# Patient Record
Sex: Male | Born: 1955
Health system: Southern US, Community
[De-identification: ages and names within clinical notes are randomized; demographics above are authoritative.]

## PROBLEM LIST (undated history)

## (undated) ENCOUNTER — Emergency Department (HOSPITAL_COMMUNITY): Admission: EM | Payer: Self-pay | Source: Home / Self Care

## (undated) ENCOUNTER — Emergency Department (HOSPITAL_COMMUNITY): Payer: Self-pay | Source: Home / Self Care

## (undated) VITALS — BP 111/79 | HR 96 | Temp 97.2°F | Resp 16 | Ht 65.5 in | Wt 146.0 lb

## (undated) DIAGNOSIS — R569 Unspecified convulsions: Secondary | ICD-10-CM

## (undated) DIAGNOSIS — F17200 Nicotine dependence, unspecified, uncomplicated: Secondary | ICD-10-CM

## (undated) DIAGNOSIS — K219 Gastro-esophageal reflux disease without esophagitis: Secondary | ICD-10-CM

## (undated) DIAGNOSIS — K8020 Calculus of gallbladder without cholecystitis without obstruction: Secondary | ICD-10-CM

## (undated) DIAGNOSIS — F1010 Alcohol abuse, uncomplicated: Secondary | ICD-10-CM

## (undated) DIAGNOSIS — F319 Bipolar disorder, unspecified: Secondary | ICD-10-CM

## (undated) DIAGNOSIS — K279 Peptic ulcer, site unspecified, unspecified as acute or chronic, without hemorrhage or perforation: Secondary | ICD-10-CM

## (undated) DIAGNOSIS — F32A Depression, unspecified: Secondary | ICD-10-CM

## (undated) DIAGNOSIS — F209 Schizophrenia, unspecified: Secondary | ICD-10-CM

## (undated) DIAGNOSIS — F99 Mental disorder, not otherwise specified: Secondary | ICD-10-CM

## (undated) DIAGNOSIS — F329 Major depressive disorder, single episode, unspecified: Secondary | ICD-10-CM

---
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
## 2005-02-05 ENCOUNTER — Inpatient Hospital Stay (HOSPITAL_COMMUNITY): Admission: AD | Admit: 2005-02-05 | Discharge: 2005-02-08 | Payer: Self-pay | Admitting: Psychiatry

## 2005-02-06 ENCOUNTER — Ambulatory Visit: Payer: Self-pay | Admitting: Psychiatry

## 2010-06-26 ENCOUNTER — Emergency Department (HOSPITAL_COMMUNITY)
Admission: EM | Admit: 2010-06-26 | Discharge: 2010-06-27 | Disposition: A | Discharge status: Home / Self Care | Payer: Self-pay | Attending: Emergency Medicine | Admitting: Emergency Medicine

## 2010-06-26 DIAGNOSIS — R110 Nausea: Secondary | ICD-10-CM | POA: Insufficient documentation

## 2010-06-26 DIAGNOSIS — Z5900 Homelessness unspecified: Secondary | ICD-10-CM | POA: Insufficient documentation

## 2010-06-26 DIAGNOSIS — R069 Unspecified abnormalities of breathing: Secondary | ICD-10-CM | POA: Insufficient documentation

## 2010-06-26 DIAGNOSIS — K219 Gastro-esophageal reflux disease without esophagitis: Secondary | ICD-10-CM | POA: Insufficient documentation

## 2010-06-26 DIAGNOSIS — F17200 Nicotine dependence, unspecified, uncomplicated: Secondary | ICD-10-CM | POA: Insufficient documentation

## 2010-06-26 DIAGNOSIS — R109 Unspecified abdominal pain: Secondary | ICD-10-CM | POA: Insufficient documentation

## 2010-06-26 DIAGNOSIS — F1010 Alcohol abuse, uncomplicated: Secondary | ICD-10-CM | POA: Insufficient documentation

## 2010-06-26 LAB — LIPASE, BLOOD: Lipase: 40 U/L (ref 11–59)

## 2010-06-26 LAB — DIFFERENTIAL
Basophils Absolute: 0.1 10*3/uL (ref 0.0–0.1)
Basophils Relative: 1 % (ref 0–1)
Eosinophils Absolute: 0.6 10*3/uL (ref 0.0–0.7)
Monocytes Relative: 7 % (ref 3–12)
Neutro Abs: 4.4 10*3/uL (ref 1.7–7.7)
Neutrophils Relative %: 48 % (ref 43–77)

## 2010-06-26 LAB — URINALYSIS, ROUTINE W REFLEX MICROSCOPIC
Hgb urine dipstick: NEGATIVE
Protein, ur: NEGATIVE mg/dL
Specific Gravity, Urine: 1.009 (ref 1.005–1.030)
Urobilinogen, UA: 0.2 mg/dL (ref 0.0–1.0)

## 2010-06-26 LAB — CBC
Hemoglobin: 14.4 g/dL (ref 13.0–17.0)
MCH: 32.2 pg (ref 26.0–34.0)
Platelets: 336 10*3/uL (ref 150–400)
RBC: 4.47 MIL/uL (ref 4.22–5.81)
WBC: 9.2 10*3/uL (ref 4.0–10.5)

## 2010-06-26 LAB — COMPREHENSIVE METABOLIC PANEL
ALT: 16 U/L (ref 0–53)
Albumin: 3.8 g/dL (ref 3.5–5.2)
Alkaline Phosphatase: 68 U/L (ref 39–117)
Calcium: 9.1 mg/dL (ref 8.4–10.5)
GFR calc Af Amer: >60 mL/min (ref >60)
Glucose, Bld: 92 mg/dL (ref 70–99)
Potassium: 3.7 mEq/L (ref 3.5–5.1)
Sodium: 133 mEq/L — ABNORMAL LOW (ref 135–145)
Total Protein: 7.5 g/dL (ref 6.0–8.3)

## 2010-06-26 LAB — RAPID URINE DRUG SCREEN, HOSP PERFORMED
Amphetamines: NOT DETECTED
Barbiturates: NOT DETECTED

## 2010-06-26 LAB — ETHANOL: Alcohol, Ethyl (B): 162 mg/dL — ABNORMAL HIGH (ref 0–10)

## 2010-06-26 LAB — BRAIN NATRIURETIC PEPTIDE: Pro B Natriuretic peptide (BNP): <30 pg/mL (ref 0.0–100.0)

## 2011-07-09 ENCOUNTER — Emergency Department (HOSPITAL_COMMUNITY)
Admission: EM | Admit: 2011-07-09 | Discharge: 2011-07-09 | Disposition: A | Discharge status: Home / Self Care | Payer: Self-pay | Attending: Emergency Medicine | Admitting: Emergency Medicine

## 2011-07-09 ENCOUNTER — Encounter (HOSPITAL_COMMUNITY): Payer: Self-pay | Admitting: *Deleted

## 2011-07-09 DIAGNOSIS — Z79899 Other long term (current) drug therapy: Secondary | ICD-10-CM | POA: Insufficient documentation

## 2011-07-09 DIAGNOSIS — R443 Hallucinations, unspecified: Secondary | ICD-10-CM | POA: Insufficient documentation

## 2011-07-09 DIAGNOSIS — Z8659 Personal history of other mental and behavioral disorders: Secondary | ICD-10-CM | POA: Insufficient documentation

## 2011-07-09 DIAGNOSIS — F319 Bipolar disorder, unspecified: Secondary | ICD-10-CM | POA: Insufficient documentation

## 2011-07-09 DIAGNOSIS — F10929 Alcohol use, unspecified with intoxication, unspecified: Secondary | ICD-10-CM

## 2011-07-09 DIAGNOSIS — F1010 Alcohol abuse, uncomplicated: Secondary | ICD-10-CM | POA: Insufficient documentation

## 2011-07-09 HISTORY — DX: Alcohol abuse, uncomplicated: F10.10

## 2011-07-09 HISTORY — DX: Schizophrenia, unspecified: F20.9

## 2011-07-09 HISTORY — DX: Bipolar disorder, unspecified: F31.9

## 2011-07-09 LAB — RAPID URINE DRUG SCREEN, HOSP PERFORMED
Amphetamines: NOT DETECTED
Barbiturates: NOT DETECTED
Benzodiazepines: NOT DETECTED
Cocaine: NOT DETECTED

## 2011-07-09 LAB — URINALYSIS, ROUTINE W REFLEX MICROSCOPIC
Bilirubin Urine: NEGATIVE
Glucose, UA: NEGATIVE mg/dL
Ketones, ur: NEGATIVE mg/dL
Leukocytes, UA: NEGATIVE
Nitrite: NEGATIVE
Specific Gravity, Urine: 1.004 — ABNORMAL LOW (ref 1.005–1.030)
pH: 7 (ref 5.0–8.0)

## 2011-07-09 LAB — CBC
HCT: 34.8 % — ABNORMAL LOW (ref 39.0–52.0)
MCHC: 34.8 g/dL (ref 30.0–36.0)
MCV: 93.3 fL (ref 78.0–100.0)
Platelets: 343 10*3/uL (ref 150–400)
RDW: 12.7 % (ref 11.5–15.5)

## 2011-07-09 LAB — BASIC METABOLIC PANEL
BUN: 4 mg/dL — ABNORMAL LOW (ref 6–23)
CO2: 27 mEq/L (ref 19–32)
Calcium: 9.1 mg/dL (ref 8.4–10.5)
Chloride: 94 mEq/L — ABNORMAL LOW (ref 96–112)
Creatinine, Ser: 0.7 mg/dL (ref 0.50–1.35)

## 2011-07-09 MED ORDER — POTASSIUM CHLORIDE CRYS ER 20 MEQ PO TBCR
40.0000 meq | EXTENDED_RELEASE_TABLET | Freq: Once | ORAL | Status: AC
  Administered 2011-07-09: 40 meq via ORAL
  Filled 2011-07-09: qty 2

## 2011-07-09 NOTE — ED Notes (Signed)
Pt sts he has been hearing voices and not telling him to hurt self or others.  No SI/ HI.  Pt is wanting detox from etoh and last drink was tonite at 0200.  Pt is homeless and has hx of having seizures before from withdrawal.  No shaking now.

## 2011-07-09 NOTE — ED Notes (Signed)
Patient is resting comfortably. 

## 2011-07-09 NOTE — Discharge Instructions (Signed)
 RESOURCE GUIDE  Dental Problems  Patients with Medicaid: [facility]                     [facility] [address]                                           [address] Phone:  [phone]                                                  Phone:  [phone]  If unable to pay or uninsured, contact:  [facility] or [facility] to become qualified for the adult dental clinic.  Chronic Pain Problems Contact [facility]  [phone] Patients need to be referred by their primary care doctor.  Insufficient Money for Medicine Contact [employer] Way:  call "211" or [facility] 271-5999.  No Primary Care Doctor Call [facility]  [phone] Other agencies that provide inexpensive medical care    [facility]  [phone]    [facility]  [phone]    [facility]  [phone]    [facility] Clinic  [phone]    [facility]  [phone]    [facility]  [phone]  Psychological Services [facility]  [phone] [facility]  [phone] [facility]   [phone] (emergency services 641-4993)  Substance Abuse Resources [facility]  336-882-2125 [facility] 336-784-9470 [facility] 336-285-9073 [facility] 336-845-3988 Residential & Outpatient Substance Abuse Program  800-659-3381  Abuse/Neglect [place] Child Abuse Hotline (336) 641-3795 [place] [facility] Abuse Hotline 800-378-5315 (After Hours)  Emergency Shelter [facility] (336) 271-5985  [facility] (336) 275-9566 [facility] (704) 372-4663  MRSA Hotline #:   832-7006    [place] [facility]  [facility] [place]     [employer]                          [facility] [address] [place]                       [address]      [address]  [place]                                                [place]                            [place] Phone:  [phone]                                   Phone:  [phone]                 Phone:  [phone]  [facility] Phone:    [phone]  [place] Child Abuse Hotline (336) 342-1394 (336) 342-3537 (After Hours)   

## 2011-07-09 NOTE — ED Provider Notes (Signed)
Pt awake/alert, no distress Does not appear psychotic, watching TV He is requesting his meds (risperdal) and reports he has f/u at [facility] previously ACT to see patient BP 125/81  Pulse 74  Temp(Src) 97.5 F (36.4 C) (Oral)  Resp 19  SpO2 100%   Joya Gaskins, MD 07/09/11 [phone]

## 2011-07-09 NOTE — ED Notes (Signed)
Unable to get patient to give urine sample

## 2011-07-09 NOTE — ED Notes (Signed)
Patient denies pain and is resting comfortably.  

## 2011-07-09 NOTE — ED Provider Notes (Signed)
History     CSN: [id number]  Arrival date & time 07/09/11  0347   First MD Initiated Contact with Patient 07/09/11 0500      Chief Complaint  Patient presents with  . Medical Clearance    (Consider location/radiation/quality/duration/timing/severity/associated sxs/prior treatment) HPI History provided by the patient. He is homeless and has a history of schizophrenia and bipolar. He has ran out of his Risperdal and is requesting a refill at this time. He has been hearing voices and is able to only does what happens when not taking his medications. He denies any suicidal or homicidal ideations. Voices tell him to hurt himself and hurt other people -  he doesn't want to do that, patient states that if he cannot get his medications filled here prefer to be discharged. No self injury. He admits to drinking alcohol. He denies any drug abuse. Past Medical History  Diagnosis Date  . Alcohol abuse   . Schizophrenia   . Bipolar 1 disorder     Past Surgical History  Procedure Date  . Skin graft     No family history on file.  History  Substance Use Topics  . Smoking status: Current Everyday Smoker  . Smokeless tobacco: Not on file  . Alcohol Use: Yes     drinks as much etoh as he can get per day      Review of Systems  Constitutional: Negative for fever and chills.  HENT: Negative for neck pain and neck stiffness.   Eyes: Negative for pain.  Respiratory: Negative for shortness of breath.   Cardiovascular: Negative for chest pain.  Gastrointestinal: Negative for abdominal pain.  Genitourinary: Negative for dysuria.  Musculoskeletal: Negative for back pain.  Skin: Negative for rash.  Neurological: Negative for headaches.  Psychiatric/Behavioral: Positive for hallucinations. Negative for suicidal ideas and self-injury.  All other systems reviewed and are negative.    Allergies  Review of patient's allergies indicates no known allergies.  Home Medications  No current  outpatient prescriptions on file.  BP 125/81  Pulse 74  Temp(Src) 97.5 F (36.4 C) (Oral)  Resp 19  SpO2 100%  Physical Exam  Constitutional: He is oriented to person, place, and time. He appears well-developed and well-nourished.  HENT:  Head: Normocephalic and atraumatic.  Eyes: Conjunctivae and EOM are normal. Pupils are equal, round, and reactive to light.  Neck: Trachea normal. Neck supple. No thyromegaly present.  Cardiovascular: Normal rate, regular rhythm, S1 normal, S2 normal and normal pulses.     No systolic murmur is present   No diastolic murmur is present  Pulses:      Radial pulses are 2+ on the right side, and 2+ on the left side.  Pulmonary/Chest: Effort normal and breath sounds normal. He has no wheezes. He has no rhonchi. He has no rales. He exhibits no tenderness.  Abdominal: Soft. Normal appearance and bowel sounds are normal. There is no tenderness. There is no CVA tenderness and negative Murphy's sign.  Musculoskeletal:       BLE:s Calves nontender, no cords or erythema, negative Homans sign  Neurological: He is alert and oriented to person, place, and time. He has normal strength. No cranial nerve deficit or sensory deficit. GCS eye subscore is 4. GCS verbal subscore is 5. GCS motor subscore is 6.  Skin: Skin is warm and dry. No rash noted. He is not diaphoretic.  Psychiatric: His speech is normal.       Cooperative and appropriate. Denies active hallucinations  ED Course  Procedures (including critical care time)  Labs Reviewed  URINALYSIS, ROUTINE W REFLEX MICROSCOPIC - Abnormal; Notable for the following:    Specific Gravity, Urine 1.004 (*)    All other components within normal limits  ETHANOL - Abnormal; Notable for the following:    Alcohol, Ethyl (B) 134 (*)    All other components within normal limits  BASIC METABOLIC PANEL - Abnormal; Notable for the following:    Sodium 134 (*)    Potassium 3.0 (*)    Chloride 94 (*)    BUN 4 (*)     All other components within normal limits  CBC - Abnormal; Notable for the following:    RBC 3.73 (*)    Hemoglobin 12.1 (*)    HCT 34.8 (*)    All other components within normal limits  URINE RAPID DRUG SCREEN (HOSP PERFORMED)     MDM   Mild hallucinations. Requesting medication refill for psych meds. ACT team consulted for evaluation and to attempt med refill.           Sunnie Nielsen, MD 07/09/11 [phone]

## 2011-07-25 ENCOUNTER — Encounter (HOSPITAL_COMMUNITY): Payer: Self-pay | Admitting: Emergency Medicine

## 2011-07-25 ENCOUNTER — Emergency Department (HOSPITAL_COMMUNITY)
Admission: EM | Admit: 2011-07-25 | Discharge: 2011-07-26 | Disposition: A | Discharge status: Home / Self Care | Payer: Self-pay | Attending: Emergency Medicine | Admitting: Emergency Medicine

## 2011-07-25 DIAGNOSIS — F319 Bipolar disorder, unspecified: Secondary | ICD-10-CM | POA: Insufficient documentation

## 2011-07-25 DIAGNOSIS — R45851 Suicidal ideations: Secondary | ICD-10-CM | POA: Insufficient documentation

## 2011-07-25 LAB — DIFFERENTIAL
Basophils Relative: 1 % (ref 0–1)
Lymphocytes Relative: 40 % (ref 12–46)
Lymphs Abs: 3.2 10*3/uL (ref 0.7–4.0)
Monocytes Relative: 6 % (ref 3–12)
Neutro Abs: 3.1 10*3/uL (ref 1.7–7.7)
Neutrophils Relative %: 39 % — ABNORMAL LOW (ref 43–77)

## 2011-07-25 LAB — BASIC METABOLIC PANEL
BUN: 9 mg/dL (ref 6–23)
CO2: 23 mEq/L (ref 19–32)
Calcium: 9.2 mg/dL (ref 8.4–10.5)
Chloride: 100 mEq/L (ref 96–112)
Creatinine, Ser: 0.69 mg/dL (ref 0.50–1.35)
GFR calc Af Amer: >90 mL/min (ref >90)
GFR calc non Af Amer: >90 mL/min (ref >90)
Glucose, Bld: 91 mg/dL (ref 70–99)
Potassium: 3.6 mEq/L (ref 3.5–5.1)
Sodium: 136 mEq/L (ref 135–145)

## 2011-07-25 LAB — RAPID URINE DRUG SCREEN, HOSP PERFORMED
Amphetamines: NOT DETECTED
Barbiturates: NOT DETECTED
Benzodiazepines: NOT DETECTED
Cocaine: NOT DETECTED
Opiates: NOT DETECTED
Tetrahydrocannabinol: NOT DETECTED

## 2011-07-25 LAB — ETHANOL: Alcohol, Ethyl (B): 205 mg/dL — ABNORMAL HIGH (ref 0–11)

## 2011-07-25 LAB — CBC
Hemoglobin: 12.4 g/dL — ABNORMAL LOW (ref 13.0–17.0)
RBC: 3.82 MIL/uL — ABNORMAL LOW (ref 4.22–5.81)
WBC: 8 10*3/uL (ref 4.0–10.5)

## 2011-07-25 MED ORDER — ACETAMINOPHEN 325 MG PO TABS
650.0000 mg | ORAL_TABLET | ORAL | Status: DC | PRN
  Administered 2011-07-25: 650 mg via ORAL
  Filled 2011-07-25: qty 1

## 2011-07-25 MED ORDER — NICOTINE 21 MG/24HR TD PT24
21.0000 mg | MEDICATED_PATCH | Freq: Every day | TRANSDERMAL | Status: DC
  Administered 2011-07-26: 21 mg via TRANSDERMAL
  Filled 2011-07-25 (×2): qty 1

## 2011-07-25 NOTE — ED Notes (Signed)
"  I need respirdol 2 mg".    I am bipolar and my voices are telling me to do everything. Denies suicidal and homicidal ideations. Uncooperative with assessment.  Denies use os street drugs.  Admits to 2-40 oz beers a day and smokes 2 ppd.  Denies pain at this time.
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
## 2011-07-25 NOTE — ED Notes (Signed)
Consulting civil engineer working at Personal assistant for pt.  Door open , observation by ED staff at this time.

## 2011-07-26 ENCOUNTER — Encounter (HOSPITAL_COMMUNITY): Payer: Self-pay | Admitting: Behavioral Health

## 2011-07-26 MED ORDER — DIPHENHYDRAMINE HCL 25 MG PO CAPS: 50.0000 mg | ORAL_CAPSULE | Freq: Every day | ORAL | Status: DC

## 2011-07-26 MED ORDER — RISPERIDONE 2 MG PO TABS: 2.0000 mg | ORAL_TABLET | Freq: Every day | ORAL | Status: DC

## 2011-07-26 NOTE — ED Provider Notes (Signed)
Shift note  Informed by Lequita Halt, patient's nurse this patient had not been added to the ACT team list and no note.  Holding orders placed by Elpidio Anis.  Case d/w  Irving Burton of ACT who has now added patient to the list to be seen.  Patient states he does not remember how he got here but he hears voices that tell him to do bad things like kill himself.  He states he drinks as much as he can afford, he is homeless.  States he's been admitted before.   Patient awake and alert this am NCAT PERRL EOMI RRR CTAB NABS, soft non tender  Added to the ACT team list to be seen.    Jasmine Awe, MD 07/26/11 [phone]

## 2011-07-26 NOTE — ED Provider Notes (Addendum)
Patient is currently being assessed by the behavioral health team.  Per RN report there were no acute events overnight.  The patient's vital signs are stable.  Dispo per behavioral health recommendations.  Gerhard Munch, MD 07/26/11 1610  1:13 PM Per Dr. Trisha Mangle, the patient is appropriate for discharge.  Gerhard Munch, MD 07/26/11 1314

## 2011-07-26 NOTE — BHH Counselor (Signed)
Dr. Jeraldine Loots has ordered a telepsych for pt.

## 2011-07-26 NOTE — Discharge Instructions (Signed)
Manic Depression (Bipolar Disorder)  Bipolar disorder is also known as manic depressive illness. It is when the brain does not function properly and causes shifts in a person's moods, energy and ability to function in everyday life. These shifts are different from the normal ups and downs that everyone experiences. Instead the shifts are severe. If this goes untreated, the person's life becomes more and more disorderly. People with this disorder can be treated can lead full and productive lives. This disorder must be managed throughout life.   SYMPTOMS    Bipolar disorder causes dramatic mood swings. These mood swings go in cycles. They cycle from extreme "highs" and irritable to deep "lows" of sadness and hopelessness.   Between the extreme moods, there are usually periods of normal mood.   Along with the mood shifts, the person will have severe changes in energy and behavior. The periods of "highs" and "lows" are called episodes of mania and depression.  Signs of mania:   Lots of energy, activity and restlessness.   Extreme "high" or good mood.   Extreme irritability.   Racing thoughts and talking very fast.   Jumping from one idea to another.   Not able to focus, easily distracted.   Little need to sleep.   Grand beliefs in one's abilities and powers.   Spending sprees.   Increased sexual drive. This can result in many sexual partners.   Poor judgment.   Abuse of drugs, particularly cocaine, alcohol, and sleeping medication.   Aggressive or provocative behavior.   A lasting period of behavior that is different from usual.   Denial that anything is wrong.  *A manic episode is identified if a "high" mood happens with three or more of the other symptoms lasting most of the day, nearly everyday for a week or longer. If the mood is more irritable in nature, four additional symptoms must be present.  Signs of depression:   Lasting feelings of sadness, anxiety, or empty mood.   Feelings of  hopelessness with negative thoughts.   Feelings of guilt, worthlessness, or helplessness.   Loss of interest or pleasure in activities once enjoyed, including sex.   Feelings of fatigue or having less energy.   Trouble focusing, making decisions, remembering.   Feeling restless or irritable.   Sleeping too little or too much.   Change in eating with possible weight gain or loss.   Feeling ongoing pain that is not caused by physical illness or injury.   Thoughts of death or suicide or suicide attempts.  *A depressive episode is identified as having five or more of the above symptoms that last most of the day, nearly everyday for two weeks or longer.  CAUSES    Research shows that there is no single cause for the disorder. Many factors act together to produce the illness.   This can be passed down from family (hereditary).   Environment may play a part.  TREATMENT    Long-term treatment is strongly recommended because bipolar disorder is a repeated illness. This disorder is better controlled if treatment is ongoing than if it is off and on.   A combination of medication and talk therapy is best for managing the disorder over time.   Medication.   Medication can be prescribed by a doctor that is an expert in treating mental disorders (psychiatrists). Medications known as "mood stabilizers" are usually prescribed to help control the illness. Other medications can be added when needed. These medicines usually treat episodes   of mania or depression that break through despite the mood stabilizer.   Talk Therapy.   Along with medication, some forms of talk therapy are helpful in providing support, education and guidance to people with the illness and their families. Studies show that this type of treatment increases mood stability, decreases need for hospitalization and improves how they function society.   Electroconvulsive Therapy (ECT).   In extreme situations where the above treatments do not work or  work too slowly to relieve severe symptoms, ECT may be considered.  Document Released: 06/16/2000 Document Revised: 02/27/2011 Document Reviewed: 02/05/2007  ExitCare Patient Information 2012 [employer].

## 2011-07-26 NOTE — ED Notes (Signed)
Dr. Palumbo at bedside. 
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
## 2011-07-26 NOTE — BHH Counselor (Signed)
Dr. Jeraldine Loots is releasing clt due to telepsych recommendations. Counselor gave clt the following informational referrals: Homeless Shelters, Mobile Crisis contacts, [facility], IOP and residential tx facilities and [facility] contact information.

## 2011-08-13 ENCOUNTER — Emergency Department (HOSPITAL_COMMUNITY): Payer: Self-pay

## 2011-08-13 ENCOUNTER — Emergency Department (HOSPITAL_COMMUNITY)
Admission: EM | Admit: 2011-08-13 | Discharge: 2011-08-13 | Disposition: A | Discharge status: Home / Self Care | Payer: Self-pay | Attending: Emergency Medicine | Admitting: Emergency Medicine

## 2011-08-13 ENCOUNTER — Encounter (HOSPITAL_COMMUNITY): Payer: Self-pay

## 2011-08-13 DIAGNOSIS — R05 Cough: Secondary | ICD-10-CM | POA: Insufficient documentation

## 2011-08-13 DIAGNOSIS — F17200 Nicotine dependence, unspecified, uncomplicated: Secondary | ICD-10-CM | POA: Insufficient documentation

## 2011-08-13 DIAGNOSIS — R0989 Other specified symptoms and signs involving the circulatory and respiratory systems: Secondary | ICD-10-CM | POA: Insufficient documentation

## 2011-08-13 DIAGNOSIS — F319 Bipolar disorder, unspecified: Secondary | ICD-10-CM | POA: Insufficient documentation

## 2011-08-13 DIAGNOSIS — R0602 Shortness of breath: Secondary | ICD-10-CM | POA: Insufficient documentation

## 2011-08-13 DIAGNOSIS — R059 Cough, unspecified: Secondary | ICD-10-CM | POA: Insufficient documentation

## 2011-08-13 DIAGNOSIS — R4789 Other speech disturbances: Secondary | ICD-10-CM | POA: Insufficient documentation

## 2011-08-13 DIAGNOSIS — R404 Transient alteration of awareness: Secondary | ICD-10-CM | POA: Insufficient documentation

## 2011-08-13 DIAGNOSIS — Z8659 Personal history of other mental and behavioral disorders: Secondary | ICD-10-CM | POA: Insufficient documentation

## 2011-08-13 LAB — DIFFERENTIAL
Basophils Absolute: 0 10*3/uL (ref 0.0–0.1)
Basophils Relative: 0 % (ref 0–1)
Eosinophils Relative: 2 % (ref 0–5)
Monocytes Absolute: 1 10*3/uL (ref 0.1–1.0)
Neutro Abs: 7.1 10*3/uL (ref 1.7–7.7)

## 2011-08-13 LAB — COMPREHENSIVE METABOLIC PANEL
Albumin: 3.1 g/dL — ABNORMAL LOW (ref 3.5–5.2)
Alkaline Phosphatase: 57 U/L (ref 39–117)
BUN: 5 mg/dL — ABNORMAL LOW (ref 6–23)
CO2: 21 mEq/L (ref 19–32)
Chloride: 100 mEq/L (ref 96–112)
Creatinine, Ser: 0.81 mg/dL (ref 0.50–1.35)
GFR calc non Af Amer: >90 mL/min (ref >90)
Potassium: 3.5 mEq/L (ref 3.5–5.1)
Total Bilirubin: 0.1 mg/dL — ABNORMAL LOW (ref 0.3–1.2)

## 2011-08-13 LAB — CBC
HCT: 33.7 % — ABNORMAL LOW (ref 39.0–52.0)
MCHC: 34.4 g/dL (ref 30.0–36.0)
MCV: 93.1 fL (ref 78.0–100.0)
Platelets: 408 10*3/uL — ABNORMAL HIGH (ref 150–400)
RDW: 12.5 % (ref 11.5–15.5)
WBC: 10.2 10*3/uL (ref 4.0–10.5)

## 2011-08-13 LAB — SALICYLATE LEVEL: Salicylate Lvl: <2 mg/dL — ABNORMAL LOW (ref 2.8–20.0)

## 2011-08-13 LAB — ETHANOL: Alcohol, Ethyl (B): 150 mg/dL — ABNORMAL HIGH (ref 0–11)

## 2011-08-13 MED ORDER — ALBUTEROL SULFATE HFA 108 (90 BASE) MCG/ACT IN AERS
2.0000 | INHALATION_SPRAY | RESPIRATORY_TRACT | Status: DC
  Administered 2011-08-13: 2 via RESPIRATORY_TRACT
  Filled 2011-08-13: qty 6.7

## 2011-08-13 NOTE — ED Notes (Signed)
Pt given urinal and made aware of need for urine 

## 2011-08-13 NOTE — ED Notes (Signed)
Airborne precautions D/C'd per Trixie Dredge, PA

## 2011-08-13 NOTE — Discharge Instructions (Signed)
Use the resources below to find a primary care provider for follow up.  If you develop worsening shortness of breath, cough, fevers, or any chest pain, return to the ER for a recheck. You may return to the ER at any time for worsening condition or any new symptoms that concern you.   If you have no primary doctor, here are some resources that may be helpful:  Medicaid-accepting [facility] Providers:   - [facility]- [address]      [phone]      Mon-Fri 9am-7pm, Sat 9am-1pm   - [facility]- [address] [place]      [phone]   - [facility]- [address] [place]      [phone]   [facility]- [address]      [phone]   - Renaye Rakers- [address] [place], [address]      [phone]      Only accepts [place] patients       after they have her name applied to their card   Self Pay (no insurance) in [place]:   - Sickle Cell Patients: Dr Willey Blade, [facility]      [address] [place]      [phone]   - [facility] [phone]   - Physician Referral Service- [phone]   - [facility]- [address] [place]      [phone]   [facility]- [address]   - [facility]- see information above      (Speak to [employer] if you do not have insurance)   - [facility]- [address] [place]      [phone]   - [facility] [place]- [address]      [phone]   - [facility]- [address]      [phone]   - Dr Julio Sicks-  [address], [place]      [phone]   - [facility]- [address]      [phone]   - [facility]- [address]      [phone]      Also [address]      [phone]   - [facility]- [address]      [phone]      1st and 3rd Saturday every month,  10am-1pm Other agencies that provide inexpensive medical care:    [facility]  [phone]    [facility]  [phone]    [facility]  [phone]    [facility]  [phone]    [facility]  [phone]  General Information: Finding a doctor when you do not have health insurance can be tricky. Although you are not limited by an insurance plan, you are of course limited by her finances and how much but he can pay out of pocket.  What are your options if you don't have health insurance?   1) Find a Librarian, academic and Pay Out of Pocket Although you won't have to find out who is covered by your insurance plan, it is a good idea to ask around and get recommendations. You will then need to call the office and see if the doctor you  have chosen will accept you as a new patient and what types of options they offer for patients who are self-pay. Some doctors offer discounts or will set up payment plans for their patients who do not have insurance, but you will need to ask so you aren't surprised when you get to your appointment.  2) Contact Your Local Health Department Not all health departments have doctors that can see patients for sick visits, but many do, so it is worth a call to see if yours does. If you don't know where your local health department is, you can check in your phone book. The CDC also has a tool to help you locate your state's health department, and many state websites also have listings of all of their local health departments.  3) Find a Walk-in Clinic If your illness is not likely to be very severe or complicated, you may want to try a walk in clinic. These are popping up all over the country in pharmacies, drugstores, and shopping centers. They're usually staffed by nurse practitioners or physician assistants that have been trained to treat common illnesses and complaints. They're usually fairly quick and inexpensive. However, if you have serious medical  issues or chronic medical problems, these are probably not your best option  Cough, Adult  A cough is a reflex that helps clear your throat and airways. It can help heal the body or may be a reaction to an irritated airway. A cough may only last 2 or 3 weeks (acute) or may last more than 8 weeks (chronic).  CAUSES Acute cough:  Viral or bacterial infections.  Chronic cough:  Infections.   Allergies.   Asthma.   Post-nasal drip.   Smoking.   Heartburn or acid reflux.   Some medicines.   Chronic lung problems (COPD).   Cancer.  SYMPTOMS   Cough.   Fever.   Chest pain.   Increased breathing rate.   High-pitched whistling sound when breathing (wheezing).   Colored mucus that you cough up (sputum).  TREATMENT   A bacterial cough may be treated with antibiotic medicine.   A viral cough must run its course and will not respond to antibiotics.   Your caregiver may recommend other treatments if you have a chronic cough.  HOME CARE INSTRUCTIONS   Only take over-the-counter or prescription medicines for pain, discomfort, or fever as directed by your caregiver. Use cough suppressants only as directed by your caregiver.   Use a cold steam vaporizer or humidifier in your bedroom or home to help loosen secretions.   Sleep in a semi-upright position if your cough is worse at night.   Rest as needed.   Stop smoking if you smoke.  SEEK IMMEDIATE MEDICAL CARE IF:   You have pus in your sputum.   Your cough starts to worsen.   You cannot control your cough with suppressants and are losing sleep.   You begin coughing up blood.   You have difficulty breathing.   You develop pain which is getting worse or is uncontrolled with medicine.   You have a fever.  MAKE SURE YOU:   Understand these instructions.   Will watch your condition.   Will get help right away if you are not doing well or get worse.  Document Released: 09/06/2010 Document Revised: 02/27/2011  Document Reviewed: 09/06/2010 [facility] Patient Information 2012 [place], [place].  Nicotine Addiction Nicotine can act as both a stimulant (excites/activates) and a sedative (calms/quiets). Immediately after exposure to nicotine,  there is a "kick" caused in part by the drug's stimulation of the adrenal glands and resulting discharge of adrenaline (epinephrine). The rush of adrenaline stimulates the body and causes a sudden release of sugar. This means that smokers are always slightly hyperglycemic. Hyperglycemic means that the blood sugar is high, just like in diabetics. Nicotine also decreases the amount of insulin which helps control sugar levels in the body. There is an increase in blood pressure, breathing, and the rate of heart beats.  In addition, nicotine indirectly causes a release of dopamine in the brain that controls pleasure and motivation. A similar reaction is seen with other drugs of abuse, such as cocaine and heroin. This dopamine release is thought to cause the pleasurable sensations when smoking. In some different cases, nicotine can also create a calming effect, depending on sensitivity of the smoker's nervous system and the dose of nicotine taken. WHAT HAPPENS WHEN NICOTINE IS TAKEN FOR LONG PERIODS OF TIME?  Long-term use of nicotine results in addiction. It is difficult to stop.   Repeated use of nicotine creates tolerance. Higher doses of nicotine are needed to get the "kick."  When nicotine use is stopped, withdrawal may last a month or more. Withdrawal may begin within a few hours after the last cigarette. Symptoms peak within the first few days and may lessen within a few weeks. For some people, however, symptoms may last for months or longer. Withdrawal symptoms include:   Irritability.   Craving.   Learning and attention deficits.   Sleep disturbances.   Increased appetite.  Craving for tobacco may last for 6 months or longer. Many behaviors done while using nicotine  can also play a part in the severity of withdrawal symptoms. For some people, the feel, smell, and sight of a cigarette and the ritual of obtaining, handling, lighting, and smoking the cigarette are closely linked with the pleasure of smoking. When stopped, they also miss the related behaviors which make the withdrawal or craving worse. While nicotine gum and patches may lessen the drug aspects of withdrawal, cravings often persist. WHAT ARE THE MEDICAL CONSEQUENCES OF NICOTINE USE?  Nicotine addiction accounts for one-third of all cancers. The top cancer caused by tobacco is lung cancer. Lung cancer is the number one cancer killer of both men and women.   Smoking is also associated with cancers of the:   Mouth.   Pharynx.   Larynx.   Esophagus.   Stomach.   Pancreas.   Cervix.   Kidney.   Ureter.   Bladder.   Smoking also causes lung diseases such as lasting (chronic) bronchitis and emphysema.   It worsens asthma in adults and children.   Smoking increases the risk of heart disease, including:   Stroke.   Heart attack.   Vascular disease.   Aneurysm.   Passive or secondary smoke can also increase medical risks including:   Asthma in children.   Sudden Infant Death Syndrome (SIDS).   Additionally, dropped cigarettes are the leading cause of residential fire fatalities.   Nicotine poisoning has been reported from accidental ingestion of tobacco products by children and pets. Death usually results in a few minutes from respiratory failure (when a person stops breathing) caused by paralysis.  TREATMENT   Medication. Nicotine replacement medicines such as nicotine gum and the patch are used to stop smoking. These medicines gradually lower the dosage of nicotine in the body. These medicines do not contain the carbon monoxide and other toxins found in tobacco  smoke.   Hypnotherapy.   Relaxation therapy.   Nicotine Anonymous (a 12-step support program). Find times  and locations in your local yellow pages.  Document Released: 11/14/2003 Document Revised: 02/27/2011 Document Reviewed: 04/07/2007 [facility] Patient Information 2012 [place], [place].  Smoking Cessation This document explains the best ways for you to quit smoking and new treatments to help. It lists new medicines that can double or triple your chances of quitting and quitting for good. It also considers ways to avoid relapses and concerns you may have about quitting, including weight gain. NICOTINE: A POWERFUL ADDICTION If you have tried to quit smoking, you know how hard it can be. It is hard because nicotine is a very addictive drug. For some people, it can be as addictive as heroin or cocaine. Usually, people make 2 or 3 tries, or more, before finally being able to quit. Each time you try to quit, you can learn about what helps and what hurts. Quitting takes hard work and a lot of effort, but you can quit smoking. QUITTING SMOKING IS ONE OF THE MOST IMPORTANT THINGS YOU WILL EVER DO.  You will live longer, feel better, and live better.   The impact on your body of quitting smoking is felt almost immediately:   Within 20 minutes, blood pressure decreases. Pulse returns to its normal level.   After 8 hours, carbon monoxide levels in the blood return to normal. Oxygen level increases.   After 24 hours, chance of heart attack starts to decrease. Breath, hair, and body stop smelling like smoke.   After 48 hours, damaged nerve endings begin to recover. Sense of taste and smell improve.   After 72 hours, the body is virtually free of nicotine. Bronchial tubes relax and breathing becomes easier.   After 2 to 12 weeks, lungs can hold more air. Exercise becomes easier and circulation improves.   Quitting will reduce your risk of having a heart attack, stroke, cancer, or lung disease:   After 1 year, the risk of coronary heart disease is cut in half.   After 5 years, the risk of stroke falls to  the same as a nonsmoker.   After 10 years, the risk of lung cancer is cut in half and the risk of other cancers decreases significantly.   After 15 years, the risk of coronary heart disease drops, usually to the level of a nonsmoker.   If you are pregnant, quitting smoking will improve your chances of having a healthy baby.   The people you live with, especially your children, will be healthier.   You will have extra money to spend on things other than cigarettes.  FIVE KEYS TO QUITTING Studies have shown that these 5 steps will help you quit smoking and quit for good. You have the best chances of quitting if you use them together: 1. Get ready.  2. Get support and encouragement.  3. Learn new skills and behaviors.  4. Get medicine to reduce your nicotine addiction and use it correctly.  5. Be prepared for relapse or difficult situations. Be determined to continue trying to quit, even if you do not succeed at first.  1. GET READY  Set a quit date.   Change your environment.   Get rid of ALL cigarettes, ashtrays, matches, and lighters in your home, car, and place of work.   Do not let people smoke in your home.   Review your past attempts to quit. Think about what worked and what did not.  Once you quit, do not smoke. NOT EVEN A PUFF!  2. GET SUPPORT AND ENCOURAGEMENT Studies have shown that you have a better chance of being successful if you have help. You can get support in many ways.  Tell your family, friends, and coworkers that you are going to quit and need their support. Ask them not to smoke around you.   Talk to your caregivers (doctor, dentist, nurse, pharmacist, psychologist, and/or smoking counselor).   Get individual, group, or telephone counseling and support. The more counseling you have, the better your chances are of quitting. Programs are available at [employer] and health centers. Call your local health department for information about programs in your  area.   Spiritual beliefs and practices may help some smokers quit.   Quit meters are Photographer that keep track of quit statistics, such as amount of "quit-time," cigarettes not smoked, and money saved.   Many smokers find one or more of the many self-help books available useful in helping them quit and stay off tobacco.  3. LEARN NEW SKILLS AND BEHAVIORS  Try to distract yourself from urges to smoke. Talk to someone, go for a walk, or occupy your time with a task.   When you first try to quit, change your routine. Take a different route to work. Drink tea instead of coffee. Eat breakfast in a different place.   Do something to reduce your stress. Take a hot bath, exercise, or read a book.   Plan something enjoyable to do every day. Reward yourself for not smoking.   Explore interactive web-based programs that specialize in helping you quit.  4. GET MEDICINE AND USE IT CORRECTLY Medicines can help you stop smoking and decrease the urge to smoke. Combining medicine with the above behavioral methods and support can quadruple your chances of successfully quitting smoking. The U.S. Food and Drug Administration (FDA) has approved 7 medicines to help you quit smoking. These medicines fall into 3 categories.  Nicotine replacement therapy (delivers nicotine to your body without the negative effects and risks of smoking):   Nicotine gum: Available over-the-counter.   Nicotine lozenges: Available over-the-counter.   Nicotine inhaler: Available by prescription.   Nicotine nasal spray: Available by prescription.   Nicotine skin patches (transdermal): Available by prescription and over-the-counter.   Antidepressant medicine (helps people abstain from smoking, but how this works is unknown):   Bupropion sustained-release (SR) tablets: Available by prescription.   Nicotinic receptor partial agonist (simulates the effect of nicotine in your brain):    Varenicline tartrate tablets: Available by prescription.   Ask your caregiver for advice about which medicines to use and how to use them. Carefully read the information on the package.   Everyone who is trying to quit may benefit from using a medicine. If you are pregnant or trying to become pregnant, nursing an infant, you are under age 52, or you smoke fewer than 10 cigarettes per day, talk to your caregiver before taking any nicotine replacement medicines.   You should stop using a nicotine replacement product and call your caregiver if you experience nausea, dizziness, weakness, vomiting, fast or irregular heartbeat, mouth problems with the lozenge or gum, or redness or swelling of the skin around the patch that does not go away.   Do not use any other product containing nicotine while using a nicotine replacement product.   Talk to your caregiver before using these products if you have diabetes, heart disease, asthma, stomach  ulcers, you had a recent heart attack, you have high blood pressure that is not controlled with medicine, a history of irregular heartbeat, or you have been prescribed medicine to help you quit smoking.  5. BE PREPARED FOR RELAPSE OR DIFFICULT SITUATIONS  Most relapses occur within the first 3 months after quitting. Do not be discouraged if you start smoking again. Remember, most people try several times before they finally quit.   You may have symptoms of withdrawal because your body is used to nicotine. You may crave cigarettes, be irritable, feel very hungry, cough often, get headaches, or have difficulty concentrating.   The withdrawal symptoms are only temporary. They are strongest when you first quit, but they will go away within 10 to 14 days.  Here are some difficult situations to watch for:  Alcohol. Avoid drinking alcohol. Drinking lowers your chances of successfully quitting.   Caffeine. Try to reduce the amount of caffeine you consume. It also lowers  your chances of successfully quitting.   Other smokers. Being around smoking can make you want to smoke. Avoid smokers.   Weight gain. Many smokers will gain weight when they quit, usually less than 10 pounds. Eat a healthy diet and stay active. Do not let weight gain distract you from your main goal, quitting smoking. Some medicines that help you quit smoking may also help delay weight gain. You can always lose the weight gained after you quit.   Bad mood or depression. There are a lot of ways to improve your mood other than smoking.  If you are having problems with any of these situations, talk to your caregiver. SPECIAL SITUATIONS AND CONDITIONS Studies suggest that everyone can quit smoking. Your situation or condition can give you a special reason to quit.  Pregnant women/new mothers: By quitting, you protect your baby's health and your own.   Hospitalized patients: By quitting, you reduce health problems and help healing.   Heart attack patients: By quitting, you reduce your risk of a second heart attack.   Lung, head, and neck cancer patients: By quitting, you reduce your chance of a second cancer.   Parents of children and adolescents: By quitting, you protect your children from illnesses caused by secondhand smoke.  QUESTIONS TO THINK ABOUT Think about the following questions before you try to stop smoking. You may want to talk about your answers with your caregiver.  Why do you want to quit?   If you tried to quit in the past, what helped and what did not?   What will be the most difficult situations for you after you quit? How will you plan to handle them?   Who can help you through the tough times? Your family? Friends? Caregiver?   What pleasures do you get from smoking? What ways can you still get pleasure if you quit?  Here are some questions to ask your caregiver:  How can you help me to be successful at quitting?   What medicine do you think would be best for me  and how should I take it?   What should I do if I need more help?   What is smoking withdrawal like? How can I get information on withdrawal?  Quitting takes hard work and a lot of effort, but you can quit smoking. FOR MORE INFORMATION  Smokefree.gov (http://www.davis-sullivan.com/) provides free, accurate, evidence-based information and professional assistance to help support the immediate and long-term needs of people trying to quit smoking. Document Released: 03/04/2001 Document Revised:  02/27/2011 Document Reviewed: 12/25/2008 [facility] Patient Information 2012 [place], [place].

## 2011-08-13 NOTE — ED Provider Notes (Signed)
Medical screening examination/treatment/procedure(s) were performed by non-physician practitioner and as supervising physician I was immediately available for consultation/collaboration.  Debbora Ang M Mykel Sponaugle, MD 08/13/11 1800 

## 2011-08-13 NOTE — ED Provider Notes (Signed)
History     CSN: [id number]  Arrival date & time 08/13/11  0500   First MD Initiated Contact with Patient 08/13/11 [phone]      Chief Complaint  Patient presents with  . Shortness of Breath    (Consider location/radiation/quality/duration/timing/severity/associated sxs/prior treatment) HPI Comments: Patient is somnolent - answers questions though speech is slurred and he goes to sleep soon after answering.   Pt reports he has been SOB for years, coughing for years, cough productive of white sputum.  States he thought he had a fever yesterday morning but doesn't think he has one any more.  Denies abdominal pain, N/V, chest pain, leg swelling.  Pt admits to drinking alcohol yesterday - 12 beers.    Patient is a 56 y.o. male presenting with shortness of breath. The history is provided by the patient. The history is limited by the condition of the patient.  Shortness of Breath  Associated symptoms include shortness of breath.    Past Medical History  Diagnosis Date  . Alcohol abuse   . Schizophrenia   . Bipolar 1 disorder     Past Surgical History  Procedure Date  . Skin graft     History reviewed. No pertinent family history.  History  Substance Use Topics  . Smoking status: Current Everyday Smoker  . Smokeless tobacco: Not on file  . Alcohol Use: 12.6 oz/week    21 Cans of beer per week     drinks as much etoh as he can get per day      Review of Systems  Unable to perform ROS: Other  Respiratory: Positive for shortness of breath.   Patient is intoxicated.   Allergies  Review of patient's allergies indicates no known allergies.  Home Medications  No current outpatient prescriptions on file.  BP 109/62  Pulse 82  Temp(Src) 98.2 F (36.8 C) (Oral)  Resp 25  SpO2 93%  Physical Exam  Nursing note and vitals reviewed. Constitutional: He is oriented to person, place, and time. He appears well-developed and well-nourished.  HENT:  Head: Normocephalic and  atraumatic.  Neck: Neck supple.  Cardiovascular: Normal rate and regular rhythm.   Pulmonary/Chest: Effort normal. No respiratory distress. He has no decreased breath sounds. He has no wheezes. He has no rhonchi. He has rales in the left lower field.  Neurological: He is alert and oriented to person, place, and time.    ED Course  Procedures (including critical care time)  Labs Reviewed  COMPREHENSIVE METABOLIC PANEL - Abnormal; Notable for the following:    Sodium 134 (*)    BUN 5 (*)    Albumin 3.1 (*)    Total Bilirubin 0.1 (*)    All other components within normal limits  ETHANOL - Abnormal; Notable for the following:    Alcohol, Ethyl (B) 150 (*)    All other components within normal limits  CBC - Abnormal; Notable for the following:    RBC 3.62 (*)    Hemoglobin 11.6 (*)    HCT 33.7 (*)    Platelets 408 (*)    All other components within normal limits  SALICYLATE LEVEL - Abnormal; Notable for the following:    Salicylate Lvl <2.0 (*)    All other components within normal limits  ACETAMINOPHEN LEVEL  DIFFERENTIAL  URINE RAPID DRUG SCREEN (HOSP PERFORMED)   Dg Chest 2 View  08/13/2011  *RADIOLOGY REPORT*  Clinical Data: Cough, shortness of breath and fever.  CHEST - 2 VIEW  Comparison:  None.  Findings: Trachea is midline.  Heart size normal.  Lungs are hyperinflated.  There may be mild interstitial prominence at the lung bases.  No pleural fluid.  IMPRESSION: Hyperinflation.  Bibasilar interstitial prominence may be due to emphysema.  A viral process cannot be excluded.  Original Report Authenticated By: Reyes Ivan, M.D.    Filed Vitals:   08/13/11 0722  BP: 110/93  Pulse:   Temp: 97.7 F (36.5 C)  Resp: 26    8:02 AM Patient sleeping soundly, on monitor, O2 sats normal.    9:18 AM Patient is alert and awake now.  States he feels much better than when he came in.  States he thought he was short of breath because he was "listening to people too much."  States  he does not feel sick and is not coughing more or more SOB than usual.  Discussed xray results and discussed the dangers of smoking.  Will d/c home with albuterol inhaler.    1. Smoker   2. Cough       MDM  Afebrile, intoxicated patient presenting with SOB, cough "for years."  CXR shows possible emphysema, labs remarkable for ETOH 150 and Hgb 11.6.  Discussed results with patient.  After patient slept and became more sober, he denied any of his presenting complaints.  D/C home with albuterol inhaler, return precautions.  Patient verbalizes understanding and agrees with plan.          Rise Patience, [place] 08/13/11 1534

## 2011-08-13 NOTE — ED Notes (Signed)
Pt also states that he wakes up sweating every night where his sleeping bag is soaking wet, hes not homeless, states he lives in a house, pt has a dry cough

## 2011-08-13 NOTE — ED Notes (Signed)
Pt complains of being short of breath, he states he knows its because he smokes, he called ems hisself for transport.

## 2011-08-13 NOTE — ED Notes (Signed)
Emily, PA at bedside.

## 2011-10-18 ENCOUNTER — Encounter (HOSPITAL_COMMUNITY): Payer: Self-pay | Admitting: *Deleted

## 2011-10-18 ENCOUNTER — Emergency Department (HOSPITAL_COMMUNITY)
Admission: EM | Admit: 2011-10-18 | Discharge: 2011-10-19 | Disposition: A | Discharge status: Home / Self Care | Payer: Self-pay | Attending: Emergency Medicine | Admitting: Emergency Medicine

## 2011-10-18 DIAGNOSIS — Z5900 Homelessness unspecified: Secondary | ICD-10-CM | POA: Insufficient documentation

## 2011-10-18 DIAGNOSIS — F17200 Nicotine dependence, unspecified, uncomplicated: Secondary | ICD-10-CM | POA: Insufficient documentation

## 2011-10-18 DIAGNOSIS — F209 Schizophrenia, unspecified: Secondary | ICD-10-CM | POA: Insufficient documentation

## 2011-10-18 DIAGNOSIS — F319 Bipolar disorder, unspecified: Secondary | ICD-10-CM | POA: Insufficient documentation

## 2011-10-18 DIAGNOSIS — F1020 Alcohol dependence, uncomplicated: Secondary | ICD-10-CM | POA: Insufficient documentation

## 2011-10-18 LAB — COMPREHENSIVE METABOLIC PANEL
ALT: 38 U/L (ref 0–53)
AST: 77 U/L — ABNORMAL HIGH (ref 0–37)
Albumin: 3.6 g/dL (ref 3.5–5.2)
Calcium: 9.2 mg/dL (ref 8.4–10.5)
GFR calc Af Amer: >90 mL/min (ref >90)
Potassium: 3.9 mEq/L (ref 3.5–5.1)
Sodium: 141 mEq/L (ref 135–145)
Total Protein: 8 g/dL (ref 6.0–8.3)

## 2011-10-18 LAB — RAPID URINE DRUG SCREEN, HOSP PERFORMED
Amphetamines: NOT DETECTED
Barbiturates: NOT DETECTED
Benzodiazepines: NOT DETECTED
Cocaine: NOT DETECTED
Tetrahydrocannabinol: NOT DETECTED

## 2011-10-18 LAB — CBC
MCH: 31.9 pg (ref 26.0–34.0)
MCHC: 34.6 g/dL (ref 30.0–36.0)
Platelets: 278 10*3/uL (ref 150–400)

## 2011-10-18 NOTE — ED Notes (Signed)
Pt reported to PTAR that he is hearing voices and has been out of Risperidal for unknown amount of time. Pt states voices are telling him to kill himself. CBG 90 for PTAR, VS: 130/78, 92, 16, 96% RA. Pt is homeless and cooperative at this time.

## 2011-10-18 NOTE — ED Notes (Signed)
Pt placed in blue scrubs.  All belongings moved to behind nurses station.

## 2011-10-18 NOTE — ED Notes (Signed)
Pt given meal and beverage

## 2011-10-19 MED ORDER — ALUM & MAG HYDROXIDE-SIMETH 200-200-20 MG/5ML PO SUSP: 30.0000 mL | ORAL | Status: DC | PRN

## 2011-10-19 MED ORDER — IBUPROFEN 600 MG PO TABS: 600.0000 mg | ORAL_TABLET | Freq: Three times a day (TID) | ORAL | Status: DC | PRN

## 2011-10-19 MED ORDER — ACETAMINOPHEN 325 MG PO TABS: 650.0000 mg | ORAL_TABLET | ORAL | Status: DC | PRN

## 2011-10-19 MED ORDER — ZOLPIDEM TARTRATE 5 MG PO TABS: 5.0000 mg | ORAL_TABLET | Freq: Every evening | ORAL | Status: DC | PRN

## 2011-10-19 MED ORDER — LORAZEPAM 1 MG PO TABS: 1.0000 mg | ORAL_TABLET | Freq: Four times a day (QID) | ORAL | Status: DC | PRN
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
## 2011-10-19 NOTE — BHH Counselor (Signed)
Discussed pt with EDP who recommends pt be discharged with outpatient referrals, homeless resources, and safety plan. Pt agrees with plan and accepted resources. Pt denies SI and HI. He endorses auditory hallucinations with no command. Pt states he has access to a phone and will call 911 or mobile crisis if needed. Pt plans to follow up with [facility].

## 2011-10-19 NOTE — ED Provider Notes (Signed)
History     CSN: [id number]  Arrival date & time 10/18/11  2112   First MD Initiated Contact with Patient 10/18/11 2227      Chief Complaint  Patient presents with  . Medical Clearance    (Consider location/radiation/quality/duration/timing/severity/associated sxs/prior treatment) HPI Comments: Patient has a history of schizophrenia, bipolar, also alcoholism, states she's been agreed +2 and half years.  Has not been on any meds and 2 and half years.  He is homeless.  Tonight.  He states his feet hurt, which have been hurting for years and he is hearing voices.  Is not quite sure what the voices are telling him.  The history is provided by the patient.    Past Medical History  Diagnosis Date  . Alcohol abuse   . Schizophrenia   . Bipolar 1 disorder     Past Surgical History  Procedure Date  . Skin graft     History reviewed. No pertinent family history.  History  Substance Use Topics  . Smoking status: Current Everyday Smoker  . Smokeless tobacco: Not on file  . Alcohol Use: 12.6 oz/week    21 Cans of beer per week     drinks as much etoh as he can get per day      Review of Systems  Constitutional: Negative for fever.  Musculoskeletal: Negative for joint swelling and gait problem.  Skin: Negative for wound.  Neurological: Negative for dizziness and headaches.  Psychiatric/Behavioral: Positive for hallucinations.    Allergies  Review of patient's allergies indicates no known allergies.  Home Medications  No current outpatient prescriptions on file.  BP 143/88  Pulse 88  Temp 98.3 F (36.8 C) (Oral)  Resp 16  SpO2 97%  Physical Exam  Constitutional: He appears well-developed and well-nourished.  HENT:  Head: Normocephalic.  Eyes: Pupils are equal, round, and reactive to light.  Neck: Normal range of motion.  Cardiovascular: Normal rate.   Pulmonary/Chest: Effort normal.  Abdominal: Soft.  Musculoskeletal: Normal range of motion.  Neurological:  He is alert.  Skin: Skin is warm. No rash noted. No erythema.  Psychiatric: His affect is not angry and not inappropriate. His speech is tangential. He is actively hallucinating. He expresses suicidal ideation. He expresses no suicidal plans.    ED Course  Procedures (including critical care time)  Labs Reviewed  CBC - Abnormal; Notable for the following:    RBC 4.20 (*)     HCT 38.7 (*)     All other components within normal limits  COMPREHENSIVE METABOLIC PANEL - Abnormal; Notable for the following:    AST 77 (*)     Total Bilirubin 0.2 (*)     All other components within normal limits  ETHANOL - Abnormal; Notable for the following:    Alcohol, Ethyl (B) 262 (*)     All other components within normal limits  URINE RAPID DRUG SCREEN (HOSP PERFORMED)   No results found.   1. Alcoholism       MDM  [facility] evaluation         [name], NP 10/19/11 2158  [name], NP 10/19/11 2158

## 2011-10-19 NOTE — ED Provider Notes (Signed)
Jimmy [name] is a 56 y.o. male being evaluated for alcohol dependence suicidal ideation. No hallucinations. At this time he is not suicidal. His only hallucinations, or chronic. They are not command hallucinations. He does not want further help now. He is homeless.  Case discussed with [facility]. They will provide resources for followup care.    Flint Melter, MD 10/19/11 [phone]

## 2011-10-20 NOTE — ED Provider Notes (Signed)
Medical screening examination/treatment/procedure(s) were performed by non-physician practitioner and as supervising physician I was immediately available for consultation/collaboration.   Celene Kras, MD 10/20/11 [phone]

## 2011-10-21 ENCOUNTER — Emergency Department (HOSPITAL_COMMUNITY)
Admission: EM | Admit: 2011-10-21 | Discharge: 2011-10-22 | Disposition: A | Discharge status: Other Acute Inpatient Hospital | Payer: Self-pay | Attending: Emergency Medicine | Admitting: Emergency Medicine

## 2011-10-21 DIAGNOSIS — Z5900 Homelessness unspecified: Secondary | ICD-10-CM | POA: Insufficient documentation

## 2011-10-21 DIAGNOSIS — R443 Hallucinations, unspecified: Secondary | ICD-10-CM | POA: Insufficient documentation

## 2011-10-21 DIAGNOSIS — F17200 Nicotine dependence, unspecified, uncomplicated: Secondary | ICD-10-CM | POA: Insufficient documentation

## 2011-10-21 DIAGNOSIS — Z8659 Personal history of other mental and behavioral disorders: Secondary | ICD-10-CM | POA: Insufficient documentation

## 2011-10-21 DIAGNOSIS — F319 Bipolar disorder, unspecified: Secondary | ICD-10-CM | POA: Insufficient documentation

## 2011-10-21 DIAGNOSIS — F1010 Alcohol abuse, uncomplicated: Secondary | ICD-10-CM | POA: Insufficient documentation

## 2011-10-21 DIAGNOSIS — R45851 Suicidal ideations: Secondary | ICD-10-CM | POA: Insufficient documentation

## 2011-10-21 DIAGNOSIS — R440 Auditory hallucinations: Secondary | ICD-10-CM

## 2011-10-22 ENCOUNTER — Encounter (HOSPITAL_COMMUNITY): Payer: Self-pay

## 2011-10-22 ENCOUNTER — Inpatient Hospital Stay (HOSPITAL_COMMUNITY)
Admission: AD | Admit: 2011-10-22 | Discharge: 2011-10-27 | DRG: 897 | Disposition: A | Discharge status: Home / Self Care | Payer: Federal, State, Local not specified - Other | Source: Ambulatory Visit | Attending: Psychiatry | Admitting: Psychiatry

## 2011-10-22 ENCOUNTER — Encounter (HOSPITAL_COMMUNITY): Payer: Self-pay | Admitting: Emergency Medicine

## 2011-10-22 DIAGNOSIS — F1020 Alcohol dependence, uncomplicated: Principal | ICD-10-CM

## 2011-10-22 DIAGNOSIS — F1210 Cannabis abuse, uncomplicated: Secondary | ICD-10-CM

## 2011-10-22 LAB — ACETAMINOPHEN LEVEL: Acetaminophen (Tylenol), Serum: <15 ug/mL (ref 10–30)

## 2011-10-22 LAB — CBC
HCT: 36.4 % — ABNORMAL LOW (ref 39.0–52.0)
Hemoglobin: 12.6 g/dL — ABNORMAL LOW (ref 13.0–17.0)
MCV: 92.2 fL (ref 78.0–100.0)
RBC: 3.95 MIL/uL — ABNORMAL LOW (ref 4.22–5.81)
RDW: 15.2 % (ref 11.5–15.5)
WBC: 7.5 10*3/uL (ref 4.0–10.5)

## 2011-10-22 LAB — COMPREHENSIVE METABOLIC PANEL
Alkaline Phosphatase: 78 U/L (ref 39–117)
BUN: 6 mg/dL (ref 6–23)
CO2: 22 mEq/L (ref 19–32)
Chloride: 100 mEq/L (ref 96–112)
Creatinine, Ser: 0.56 mg/dL (ref 0.50–1.35)
GFR calc Af Amer: >90 mL/min (ref >90)
GFR calc non Af Amer: >90 mL/min (ref >90)
Glucose, Bld: 133 mg/dL — ABNORMAL HIGH (ref 70–99)
Potassium: 3.3 mEq/L — ABNORMAL LOW (ref 3.5–5.1)
Total Bilirubin: 0.3 mg/dL (ref 0.3–1.2)

## 2011-10-22 LAB — RAPID URINE DRUG SCREEN, HOSP PERFORMED
Barbiturates: NOT DETECTED
Benzodiazepines: NOT DETECTED

## 2011-10-22 LAB — ETHANOL: Alcohol, Ethyl (B): 115 mg/dL — ABNORMAL HIGH (ref 0–11)

## 2011-10-22 MED ORDER — IBUPROFEN 600 MG PO TABS: 600.0000 mg | ORAL_TABLET | Freq: Three times a day (TID) | ORAL | Status: DC | PRN

## 2011-10-22 MED ORDER — ZOLPIDEM TARTRATE 5 MG PO TABS: 5.0000 mg | ORAL_TABLET | Freq: Every evening | ORAL | Status: DC | PRN

## 2011-10-22 MED ORDER — ADULT MULTIVITAMIN W/MINERALS CH
1.0000 | ORAL_TABLET | Freq: Every day | ORAL | Status: DC
  Administered 2011-10-22: 1 via ORAL
  Filled 2011-10-22: qty 1

## 2011-10-22 MED ORDER — ONDANSETRON HCL 4 MG PO TABS: 4.0000 mg | ORAL_TABLET | Freq: Three times a day (TID) | ORAL | Status: DC | PRN

## 2011-10-22 MED ORDER — LORAZEPAM 1 MG PO TABS: 1.0000 mg | ORAL_TABLET | Freq: Four times a day (QID) | ORAL | Status: DC | PRN

## 2011-10-22 MED ORDER — HYDROXYZINE HCL 25 MG PO TABS
25.0000 mg | ORAL_TABLET | Freq: Four times a day (QID) | ORAL | Status: AC | PRN
  Administered 2011-10-24: 25 mg via ORAL

## 2011-10-22 MED ORDER — ACETAMINOPHEN 325 MG PO TABS: 650.0000 mg | ORAL_TABLET | ORAL | Status: DC | PRN

## 2011-10-22 MED ORDER — LORAZEPAM 2 MG/ML IJ SOLN: 1.0000 mg | Freq: Four times a day (QID) | INTRAMUSCULAR | Status: DC | PRN

## 2011-10-22 MED ORDER — LOPERAMIDE HCL 2 MG PO CAPS
2.0000 mg | ORAL_CAPSULE | ORAL | Status: AC | PRN
Start: 2011-10-22 — End: 2011-10-25

## 2011-10-22 MED ORDER — VITAMIN B-1 100 MG PO TABS
100.0000 mg | ORAL_TABLET | Freq: Every day | ORAL | Status: DC
  Administered 2011-10-23 – 2011-10-27 (×5): 100 mg via ORAL
  Filled 2011-10-22 (×8): qty 1

## 2011-10-22 MED ORDER — ONDANSETRON 4 MG PO TBDP: 4.0000 mg | ORAL_TABLET | Freq: Four times a day (QID) | ORAL | Status: AC | PRN

## 2011-10-22 MED ORDER — ALUM & MAG HYDROXIDE-SIMETH 200-200-20 MG/5ML PO SUSP: 30.0000 mL | ORAL | Status: DC | PRN

## 2011-10-22 MED ORDER — ADULT MULTIVITAMIN W/MINERALS CH
1.0000 | ORAL_TABLET | Freq: Every day | ORAL | Status: DC
  Administered 2011-10-23 – 2011-10-27 (×5): 1 via ORAL
  Filled 2011-10-22 (×8): qty 1

## 2011-10-22 MED ORDER — MAGNESIUM HYDROXIDE 400 MG/5ML PO SUSP: 30.0000 mL | Freq: Every day | ORAL | Status: DC | PRN

## 2011-10-22 MED ORDER — NICOTINE 21 MG/24HR TD PT24
21.0000 mg | MEDICATED_PATCH | Freq: Every day | TRANSDERMAL | Status: DC
  Administered 2011-10-22: 21 mg via TRANSDERMAL
  Filled 2011-10-22: qty 1

## 2011-10-22 MED ORDER — CHLORDIAZEPOXIDE HCL 25 MG PO CAPS
25.0000 mg | ORAL_CAPSULE | Freq: Every day | ORAL | Status: AC
  Administered 2011-10-27: 25 mg via ORAL
  Filled 2011-10-22 (×2): qty 1

## 2011-10-22 MED ORDER — PNEUMOCOCCAL VAC POLYVALENT 25 MCG/0.5ML IJ INJ
0.5000 mL | INJECTION | INTRAMUSCULAR | Status: AC
  Administered 2011-10-23: 0.5 mL via INTRAMUSCULAR

## 2011-10-22 MED ORDER — VITAMIN B-1 100 MG PO TABS
100.0000 mg | ORAL_TABLET | Freq: Every day | ORAL | Status: DC
  Administered 2011-10-22: 100 mg via ORAL
  Filled 2011-10-22: qty 1

## 2011-10-22 MED ORDER — THIAMINE HCL 100 MG/ML IJ SOLN: 100.0000 mg | Freq: Once | INTRAMUSCULAR | Status: DC

## 2011-10-22 MED ORDER — NICOTINE 21 MG/24HR TD PT24
21.0000 mg | MEDICATED_PATCH | Freq: Every day | TRANSDERMAL | Status: DC
  Administered 2011-10-23 – 2011-10-26 (×4): 21 mg via TRANSDERMAL
  Filled 2011-10-22 (×7): qty 1

## 2011-10-22 MED ORDER — ACETAMINOPHEN 325 MG PO TABS
650.0000 mg | ORAL_TABLET | Freq: Four times a day (QID) | ORAL | Status: DC | PRN
Start: 2011-10-22 — End: 2011-10-27

## 2011-10-22 MED ORDER — LORAZEPAM 1 MG PO TABS: 1.0000 mg | ORAL_TABLET | Freq: Three times a day (TID) | ORAL | Status: DC | PRN

## 2011-10-22 MED ORDER — THIAMINE HCL 100 MG/ML IJ SOLN: 100.0000 mg | Freq: Every day | INTRAMUSCULAR | Status: DC

## 2011-10-22 MED ORDER — CHLORDIAZEPOXIDE HCL 25 MG PO CAPS
25.0000 mg | ORAL_CAPSULE | ORAL | Status: AC
  Administered 2011-10-26: 25 mg via ORAL

## 2011-10-22 MED ORDER — CHLORDIAZEPOXIDE HCL 25 MG PO CAPS
25.0000 mg | ORAL_CAPSULE | Freq: Four times a day (QID) | ORAL | Status: AC
  Administered 2011-10-22 – 2011-10-24 (×6): 25 mg via ORAL
  Filled 2011-10-22 (×4): qty 1

## 2011-10-22 MED ORDER — CHLORDIAZEPOXIDE HCL 25 MG PO CAPS
25.0000 mg | ORAL_CAPSULE | Freq: Three times a day (TID) | ORAL | Status: AC
  Administered 2011-10-24 – 2011-10-25 (×3): 25 mg via ORAL
  Filled 2011-10-22 (×5): qty 1

## 2011-10-22 MED ORDER — FOLIC ACID 1 MG PO TABS
1.0000 mg | ORAL_TABLET | Freq: Every day | ORAL | Status: DC
  Administered 2011-10-22: 1 mg via ORAL
  Filled 2011-10-22: qty 1

## 2011-10-22 MED ORDER — CHLORDIAZEPOXIDE HCL 25 MG PO CAPS
25.0000 mg | ORAL_CAPSULE | Freq: Four times a day (QID) | ORAL | Status: AC | PRN
  Filled 2011-10-22: qty 1

## 2011-10-22 NOTE — ED Provider Notes (Signed)
Medical screening examination/treatment/procedure(s) were performed by non-physician practitioner and as supervising physician I was immediately available for consultation/collaboration.   Lyanne Co, MD 10/22/11 [phone]

## 2011-10-22 NOTE — Progress Notes (Signed)
Voluntary admission for 56 y.o. Homeless male with flat affect, depressed mood.  Pt. Reports that he hears voices telling him to do bad things but has not heard them today.  Reports that he needs medications to help stop the voices but  has no money.   Reports alcohol use of a 12 pack a day for 20 years.   Pt. Denies SI/HI  And contracts safety.  Pt. Given food and consumed 100% of the food given. Reports a decrease in weight from not being able to obtain food. Encouragement and support given.

## 2011-10-22 NOTE — ED Notes (Signed)
Pt attended [facility] ED group facilitated by Wilkie Aye, MDiv.   After introductions and group rules, group members shared their present feelings with one another and decided on a common theme of desperation (feeling against the wall) and motivation (keeping going).   Members described losses associated with desperation, and warning signs that allowed them to recognize when they are reaching their "limit." Members discussed areas in their lives where they see motivation, including present goals and one strategy they go to when they are feeling as though they are overwhelmed and cannot change.    After introducing himself to group, pt shared that he wished to detox from ETOH.  Pt described living on street for 2 years following wife's death (in Aug 26, 2008?).  Pt spoke with group about goal of being able to retain job and desire to complete rehab as a step toward.   Shared prior experiences with rehabilitation and reported leaving on rehab facility because he did not have cigarettes.   Pt was attentive to other group member's stories and offered his own experience as support.   Belva Crome  MDiv, Chaplain

## 2011-10-22 NOTE — ED Notes (Signed)
Patient reports that he wants to go back to detox.

## 2011-10-22 NOTE — Tx Team (Signed)
Initial Interdisciplinary Treatment Plan  PATIENT STRENGTHS: (choose at least two) Ability for insight Average or above average intelligence  PATIENT STRESSORS: Financial difficulties Substance abuse   PROBLEM LIST: Problem List/Patient Goals Date to be addressed Date deferred Reason deferred Estimated date of resolution  Psychotic D/O 10/22/11     Alcohol Dependence 10/22/11                                                DISCHARGE CRITERIA:  Adequate post-discharge living arrangements Improved stabilization in mood, thinking, and/or behavior Motivation to continue treatment in a less acute level of care Need for constant or close observation no longer present Verbal commitment to aftercare and medication compliance Withdrawal symptoms are absent or subacute and managed without 24-hour nursing intervention  PRELIMINARY DISCHARGE PLAN: Attend 12-step recovery group Placement in alternative living arrangements  PATIENT/FAMIILY INVOLVEMENT: This treatment plan has been presented to and reviewed with the patient, Jimmy [name], and/or family member,  The patient and family have been given the opportunity to ask questions and make suggestions.  Laquanda Bick Dawkins 10/22/2011, 11:25 PM

## 2011-10-22 NOTE — ED Notes (Signed)
Pt was reportedly released from a detox facility on 10/21/11 am, spent the day drinking and presented to [facility] that evening asking to be sent back to detox. He has been accepted at [facility] and report was called to Selena Batten, Charity fundraiser. Security notified and pt's belongings have been sent with him to [facility], transported by security and a staff member. Pt departs in safe and stable condition, verbalizes understanding of transport and states that he would like to "stay clean" this time.

## 2011-10-22 NOTE — ED Provider Notes (Signed)
History     CSN: [id number]  Arrival date & time 10/21/11  2253   First MD Initiated Contact with Patient 10/22/11 0325      Chief Complaint  Patient presents with  . Medical Clearance  . Suicidal    (Consider location/radiation/quality/duration/timing/severity/associated sxs/prior treatment) HPI  Patient presents to emergency department complaining of alcohol abuse, suicidal ideation, and hallucinations. Patient states he was seen in the emergency room a few days ago but opted not stay for help. Patient states that he is a heavy drinker and homeless. Patient states he has auditory hallucinations with voices telling him to "do many bad things." Patient states at times he has thought about hurting himself and hurting others with voices telling him to do so. Patient states he takes no medicines on regular basis. He has no physical complaints. Patient is requesting detox of alcohol abuse and help "probably voices." He denies aggravating or alleviating factors.  Past Medical History  Diagnosis Date  . Alcohol abuse   . Schizophrenia   . Bipolar 1 disorder     Past Surgical History  Procedure Date  . Skin graft     History reviewed. No pertinent family history.  History  Substance Use Topics  . Smoking status: Current Everyday Smoker  . Smokeless tobacco: Not on file  . Alcohol Use: 12.6 oz/week    21 Cans of beer per week     drinks as much etoh as he can get per day      Review of Systems  All other systems reviewed and are negative.    Allergies  Review of patient's allergies indicates no known allergies.  Home Medications  No current outpatient prescriptions on file.  BP 106/88  Pulse 84  Temp 97.9 F (36.6 C) (Oral)  Resp 22  SpO2 100%  Physical Exam  Nursing note and vitals reviewed. Constitutional: He is oriented to person, place, and time. He appears well-developed and well-nourished. No distress.  HENT:  Head: Normocephalic and atraumatic.    Eyes: Conjunctivae are normal.  Neck: Normal range of motion. Neck supple.  Cardiovascular: Normal rate, regular rhythm, normal heart sounds and intact distal pulses.  Exam reveals no gallop and no friction rub.   No murmur heard. Pulmonary/Chest: Effort normal and breath sounds normal. No respiratory distress. He has no wheezes. He has no rales. He exhibits no tenderness.  Abdominal: Soft. Bowel sounds are normal. He exhibits no distension and no mass. There is no tenderness. There is no rebound and no guarding.  Musculoskeletal: Normal range of motion. He exhibits no edema and no tenderness.  Neurological: He is alert and oriented to person, place, and time.  Skin: Skin is warm and dry. No rash noted. He is not diaphoretic. No erythema.  Psychiatric: His mood appears anxious. His speech is slurred. He expresses suicidal ideation.    ED Course  Procedures (including critical care time)  Holding orders written and alcohol withdrawal protocol started. Pending ACT team evaluation for consideration of inpatient placement.  Labs Reviewed  CBC - Abnormal; Notable for the following:    RBC 3.95 (*)     Hemoglobin 12.6 (*)     HCT 36.4 (*)     All other components within normal limits  COMPREHENSIVE METABOLIC PANEL - Abnormal; Notable for the following:    Potassium 3.3 (*)     Glucose, Bld 133 (*)     Albumin 3.3 (*)     AST 48 (*)  All other components within normal limits  ETHANOL - Abnormal; Notable for the following:    Alcohol, Ethyl (B) 115 (*)     All other components within normal limits  ACETAMINOPHEN LEVEL  URINE RAPID DRUG SCREEN (HOSP PERFORMED)   No results found.   1. Auditory hallucination   2. Alcohol abuse   3. Suicidal ideation       MDM  Medically cleared and pending evaluation for consideration of inpatient placement.         [place], [place] 10/22/11 [phone]
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
## 2011-10-22 NOTE — ED Provider Notes (Signed)
Pt accepted by [facility] for admission by Dr. Dan Humphreys.    Nat Christen, MD 10/22/11 2037

## 2011-10-22 NOTE — ED Notes (Signed)
Bed:WTR5<BR> Expected date:<BR> Expected time:<BR> Means of arrival:<BR> Comments:<BR>

## 2011-10-22 NOTE — ED Provider Notes (Signed)
Pt sleeping this am.  Awaiting psych dispo.  Filed Vitals:   10/22/11 0402  BP: 108/61  Pulse: 83  Temp: 98.6 F (37 C)  Resp: 18     Celene Kras, MD 10/22/11 [phone]

## 2011-10-23 DIAGNOSIS — F1020 Alcohol dependence, uncomplicated: Secondary | ICD-10-CM | POA: Diagnosis present

## 2011-10-23 DIAGNOSIS — F1210 Cannabis abuse, uncomplicated: Secondary | ICD-10-CM | POA: Diagnosis present

## 2011-10-23 MED ORDER — POTASSIUM CHLORIDE CRYS ER 10 MEQ PO TBCR
10.0000 meq | EXTENDED_RELEASE_TABLET | ORAL | Status: AC
  Administered 2011-10-23 – 2011-10-26 (×5): 10 meq via ORAL
  Filled 2011-10-23 (×7): qty 1

## 2011-10-23 NOTE — Tx Team (Signed)
Interdisciplinary Treatment Plan Update (Adult)  Date:  10/23/2011 Time Reviewed:  10:29 AM  Progress in Treatment: Attending groups: Yes. Participating in groups:  Yes. Taking medication as prescribed:  Yes. Tolerating medication:  Yes. Family/Significant othe contact made:  No, will contact:  family collaterals Patient understands diagnosis:  Yes. Discussing patient identified problems/goals with staff:  Yes. Medical problems stabilized or resolved:  No. Denies suicidal/homicidal ideation: Yes. Issues/concerns per patient self-inventory:  No. Other:  New problem(s) identified: No  Reason for Continuation of Hospitalization: Medication stabilization  Interventions implemented related to continuation of hospitalization:  Additional comments:  Estimated length of stay: 3-5 days  [facility]  New goal(s):  Review of initial/current patient goals per problem list:   1.  Goal(s): Reduce depressive symptoms from 4 to 1  Met:  Yes  Target date: by discharge  As evidenced by: rated depression at zero  2.  Goal (s): Reduce anxiety from 4 to 1  Met:  Yes  Target date: by discharge  As evidenced by: rated anxiety at zero  3.  Goal(s):Eliminate suicidal thoughts  Met:  Yes  Target date: by discharge  As evidenced by: denies SI  4.  Goal(s):  Met:    Target date:  As evidenced by:  Attendees: Patient:  Jimmy [name] 8/1/201310:29 AM  Family:   8/1/201310:29 AM  Physician:  Franchot Gallo, MD 8/1/201310:29 AM  Nursing:   Nestor Ramp, RN 8/1/201310:29 AM  Case Manager: Estevan Ryder, LCSWA  8/1/201310:29 AM  Counselor:  Veto Kemps MT-BC 8/1/201310:29 AM  Other:   8/1/201310:29 AM  Other:   8/1/201310:29 AM  Other:   8/1/201310:29 AM  Other:  8/1/201310:29 AM  Other:  8/1/201310:29 AM  Other:  8/1/201310:29 AM  Other:  8/1/201310:29 AM  Other:  8/1/201310:29 AM  Other:  8/1/201310:29 AM  Other:    8/1/201310:29 AM   Scribe for Treatment Team:   Calvert Cantor, 10/23/2011, 10:29 AM

## 2011-10-23 NOTE — Progress Notes (Signed)
D: Patient denies SI/HI and auditory and visual hallucinations. The patient displays an irritable mood and affect as he occasionally gets upset at having to wait to use the phone and when he is asked personal questions by staff. The patient rates his depression a 3 out of 10 and his hopelessness an 8 out of 10 (1 low/10 high). The patient reports that he slept fairly and that he has a good appetite and a normal energy level. The patient states that his goal upon leaving this facility is to get a place to live and a job.  A: Patient given emotional support from RN. Patient encouraged to come to staff with concerns and/or questions. Patient's medication routine continued. Patient's orders and plan of care reviewed.  R: Patient remains appropriate and cooperative. Will continue to monitor patient q15 minutes for safety.
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
## 2011-10-23 NOTE — [facility] Suicide Risk Assessment (Signed)
Suicide Risk Assessment  Admission Assessment     Demographic factors:  Assessment Details Time of Assessment: Admission Information Obtained From: Patient Current Mental Status:  Current Mental Status:  (denies) Loss Factors:  Loss Factors: Loss of significant relationship;Financial problems / change in socioeconomic status Historical Factors:  Historical Factors:  (denies) Risk Reduction Factors:  Risk Reduction Factors: Religious beliefs about death  CLINICAL FACTORS:   Alcohol/Substance Abuse/Dependencies  COGNITIVE FEATURES THAT CONTRIBUTE TO RISK:  None Noted.    Current Mental Status Per Physician:  Diagnosis:  Axis I:  Alcohol Dependence.  Cannabis Abuse.   The patient was seen today and reports the following:   ADL's: Intact.  Sleep: The patient reports that he is sleeping well at night.  Appetite: The patient reports a good appetite this morning.   Mild>(1-10) >Severe  Hopelessness (1-10): 0  Depression (1-10): 3  Anxiety (1-10): 0   Suicidal Ideation: The patient denies any suicidal ideations today.  Plan: No  Intent: No  Means: No   Homicidal Ideation: The patient denies any homicidal ideations today.  Plan: No  Intent: No.  Means: No   General Appearance/Behavior: The patient was friendly and cooperative today with this provider.  Eye Contact: Good.  Speech: Appropriate in rate and volume with no pressuring noted today.  Motor Behavior: wnl.  Level of Consciousness: Alert and Oriented x 3.  Mental Status: Alert and Oriented x 3.  Mood: Mildly depressed.  Affect: Appears mildly constricted.  Anxiety Level: No anxiety reported today.  Thought Process: wnl.  Thought Content: The patient denies any auditory or visual hallucinations today as well as any delusional thinking.  Perception: wnl.  Judgment: Good.  Insight: Good.  Cognition: Oriented to person, place and time.   The patient was seen today and reports that he is sleeping well without  difficulty and reports a good appetite. He reports mild feelings of sadness, anhedonia and depressed mood and adamantly denies any suicidal or homicidal ideations. The patient also denies any auditory or visual hallucinations or delusional thinking or any significant anxiety symptoms. He states he is here solely for detox from alcohol and to possibly get into a longer term treatment facility.  Review of Systems:  Neurological: The patient denies any headaches today. He denies any seizures or dizziness.  G.I.: The patient denies any constipation or G.I. Upset today.  Musculoskeletal: The patient denies any musculoskeletal issues today.   Current Medications:    . chlordiazePOXIDE  25 mg Oral QID   Followed by  . chlordiazePOXIDE  25 mg Oral TID   Followed by  . chlordiazePOXIDE  25 mg Oral BH-qamhs   Followed by  . chlordiazePOXIDE  25 mg Oral Daily  . multivitamin with minerals  1 tablet Oral Daily  . nicotine  21 mg Transdermal Q0600  . pneumococcal 23 valent vaccine  0.5 mL Intramuscular Tomorrow-1000  . potassium chloride  10 mEq Oral BH-qamhs  . thiamine  100 mg Intramuscular Once  . thiamine  100 mg Oral Daily   Treatment Plan Summary:  1. Daily contact with patient to assess and evaluate symptoms and progress in treatment.  2. Medication management  3. The patient will deny suicidal ideations or homicidal ideations for 48 hours prior to discharge and have a depression and anxiety rating of 3 or less. The patient will also deny any auditory or visual hallucinations or delusional thinking.  4. The patient will deny any symptoms of substance withdrawal at time of discharge.  Plan:  1. Will continue the patient on his Librium Detox Protocol which was ordered at time of admission. 2. Laboratory studies reviewed.  3. Will start KCL 10 mEq po qam and hs x 6 doses for hypokalemia. 4. Will order a TSH, Free T3 and Free T4 to evaluate the patient's thyroid functioning. 5. Will  continue to monitor.   SUICIDE RISK:  Minimal: No identifiable suicidal ideation.  Patients presenting with no risk factors but with morbid ruminations; may be classified as minimal risk based on the severity of the depressive symptoms  Jimmy [name] 10/23/2011, 4:11 PM

## 2011-10-23 NOTE — Progress Notes (Signed)
D: Pt in room resting during interview with Clinical research associate.  Patient calm and cooperative during assessment.  Patient denies SI/HI and denies AVH at this time although he does admit to hearing voices when not drinking alcohol.  A: Staff to monitor Q 15 mins for safety. Encouragement ans support offered.  Scheduled medications administered per MD orders. R: Patient remains calm and cooperative. Patient  Taking scheduled Librium but refused Thiamine due to the fear of needles. Patient remains safe on the unit.

## 2011-10-23 NOTE — Discharge Planning (Signed)
Pt was seen this morning for discharge planning group.  Pt rated his depression level at a one and his anxiety level at a one also.  Pt does not have transportation and he is currently homeless.  Before being admitted to the hospital pt reported that he lived in a tent and has been for the last two and a half years.  Pt rated his depression and anxiety level at a one.  Pt reported that he would like help to stop drinking and is interested in receiving help from a agency by the name of [facility] in [place].  Pt stated that he does experience AH, yet he was not at the moment. Pt denies SI/HI.  CM will assist pt in aftercare planning.

## 2011-10-23 NOTE — Progress Notes (Signed)
D: Patient in day room interacting with peers on approach. Patient engaged in conversation with Clinical research associate. Patient complained of depression rating it 5/10 tonight.  Patient denies SI/HI and denies AVH stating the hallucinations only occur when patient is drinking alcohol.  Patient stated he had an "ok" day today.  Patient smiles when speaking with Clinical research associate.  Patient states his goal is to stay off alcohol and patient wants to eventually be able to work again one day.  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   A: Encouragement and support offered.  Patient medications administered per orders.  Staff to monitor Q 15 mins for safety.  R: Patient calm, cooperative and taking medications given. Patient did attend karaoke and patient stated he enjoyed it.  Patient remains safe on the unit.

## 2011-10-24 LAB — TSH: TSH: 2.65 u[IU]/mL (ref 0.350–4.500)

## 2011-10-24 LAB — T3, FREE: T3, Free: 2.6 pg/mL (ref 2.3–4.2)

## 2011-10-24 NOTE — Progress Notes (Signed)
[facility] Group Notes:  (Counselor/Nursing/MHT/Case Management/Adjunct)  10/24/2011 3:21 PM  Type of Therapy:  Group Therapy  Participation Level:  Active  Participation Quality:  Attentive, Inattentive and Sharing  Affect:  Appropriate  Cognitive:  Oriented  Insight:  Limited  Engagement in Group:  Good  Engagement in Therapy:  Good  Modes of Intervention:  Education and Problem-solving, support   Summary of Progress/Problems: Patient talked about doing well for 21 years until wife died. He went to stay with his brother and sister in law, and she kicked him out. He is still trying to get disability. Really wants to get a job and not have to depend on others.   Tevis Conger, Aram Beecham 10/24/2011, 3:21 PM

## 2011-10-24 NOTE — Progress Notes (Signed)
10/24/2011         Time: 0930      Group Topic/Focus: The focus of the group is on enhancing the patients' ability to cope with stressors by understanding what coping is, why it is important, the negative effects of stress and developing healthier coping skills. Patients practice [place] and discuss how exercise can be used as a healthy coping strategy.  Participation Level: Active  Participation Quality: Attentive  Affect: Appropriate  Cognitive: Alert  Additional Comments: Patient reports he is "doing great because I have three meals a day and a roof over my head." Patient participated actively, but said very little during group.   Jimmy [name] 10/24/2011 11:51 AM

## 2011-10-24 NOTE — Progress Notes (Signed)
[facility] Group Notes:  (Counselor/Nursing/MHT/Case Management/Adjunct)  10/24/2011 9:10 AM  Type of Therapy:  Group Therapy  10/23/11  Participation Level:  Active  Participation Quality:  Attentive and Sharing  Affect:  Appropriate  Cognitive:  Oriented  Insight:  Limited  Engagement in Group:  Good  Engagement in Therapy:  Good  Modes of Intervention:  Education, Problem-solving and Support  Summary of Progress/Problems: Patient stated that he is happy to be in the hospital with good food, air-conditioning, a place to stay, etc. He hopes to go to [facility] in [place] for SA treatment. 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   Crandall Harvel, Aram Beecham 10/24/2011, 9:10 AM

## 2011-10-24 NOTE — Progress Notes (Signed)
D: Patient denies SI/HI and auditory and visual hallucinations. The patient has a somewhat depressed mood and affect. The patient rates his depression a 2 and his hopeless a 4 out of 10 (1 low/10 high). The patient reports sleeping well and having a good appetite and energy level. The patient is interacting on the unit appropriately and attending groups. The patient states that when he leaves he would like to be discharged to "[facility]".  A: Patient given emotional support from RN. Patient encouraged to come to staff with concerns and/or questions. Patient's medication routine continued. Patient's orders and plan of care reviewed. Care Management is aware of patient's desire to be discharged to "[facility]" upon patient's discharge.  R: Patient remains appropriate and cooperative. Will continue to monitor patient q15 minutes for safety.

## 2011-10-24 NOTE — Progress Notes (Signed)
[facility] MD Progress Note  10/24/2011 1:42 PM  Current Mental Status Per Physician:  Diagnosis:  Axis I: Alcohol Dependence.  Cannabis Abuse.   The patient was seen today and reports the following:   ADL's: Intact.  Sleep: The patient reports that he is sleeping well at night.  Appetite: The patient reports a good appetite this morning.   Mild>(1-10) >Severe  Hopelessness (1-10): 0  Depression (1-10): 0  Anxiety (1-10): 0   Suicidal Ideation: The patient denies any suicidal ideations today.  Plan: No  Intent: No  Means: No   Homicidal Ideation: The patient denies any homicidal ideations today.  Plan: No  Intent: No.  Means: No   General Appearance/Behavior: The patient remains friendly and cooperative today with this provider.  Eye Contact: Good.  Speech: Appropriate in rate and volume with no pressuring noted today.  Motor Behavior: wnl.  Level of Consciousness: Alert and Oriented x 3.  Mental Status: Alert and Oriented x 3.  Mood: Essentially euthymic today.  Affect: Appears mildly constricted.  Anxiety Level: No anxiety reported today.  Thought Process: wnl.  Thought Content: The patient denies any auditory or visual hallucinations today as well as any delusional thinking.  Perception: wnl.  Judgment: Good.  Insight: Good.  Cognition: Oriented to person, place and time.  Sleep:  Number of Hours: 6.5    Vital Signs:Blood pressure 117/78, pulse 95, temperature 98.5 F (36.9 C), temperature source Oral, resp. rate 16, height 5\' 8"  (1.727 m), weight 66.679 kg (147 lb).  Current Medications: Current Facility-Administered Medications  Medication Dose Route Frequency Provider Last Rate Last Dose  . acetaminophen (TYLENOL) tablet 650 mg  650 mg Oral Q6H PRN Mike Craze, MD      . alum & mag hydroxide-simeth (MAALOX/MYLANTA) 200-200-20 MG/5ML suspension 30 mL  30 mL Oral Q4H PRN Mike Craze, MD      . chlordiazePOXIDE (LIBRIUM) capsule 25 mg  25 mg Oral Q6H PRN Mike Craze, MD      . chlordiazePOXIDE (LIBRIUM) capsule 25 mg  25 mg Oral QID Mike Craze, MD   25 mg at 10/24/11 1478   Followed by  . chlordiazePOXIDE (LIBRIUM) capsule 25 mg  25 mg Oral TID Mike Craze, MD   25 mg at 10/24/11 1208   Followed by  . chlordiazePOXIDE (LIBRIUM) capsule 25 mg  25 mg Oral BH-qamhs Mike Craze, MD       Followed by  . chlordiazePOXIDE (LIBRIUM) capsule 25 mg  25 mg Oral Daily Mike Craze, MD      . hydrOXYzine (ATARAX/VISTARIL) tablet 25 mg  25 mg Oral Q6H PRN Mike Craze, MD      . loperamide (IMODIUM) capsule 2-4 mg  2-4 mg Oral PRN Mike Craze, MD      . magnesium hydroxide (MILK OF MAGNESIA) suspension 30 mL  30 mL Oral Daily PRN Mike Craze, MD      . multivitamin with minerals tablet 1 tablet  1 tablet Oral Daily Mike Craze, MD   1 tablet at 10/24/11 0803  . nicotine (NICODERM CQ - dosed in mg/24 hours) patch 21 mg  21 mg Transdermal Q0600 Mike Craze, MD   21 mg at 10/24/11 0630  . ondansetron (ZOFRAN-ODT) disintegrating tablet 4 mg  4 mg Oral Q6H PRN Mike Craze, MD      . potassium chloride (K-DUR,KLOR-CON) CR tablet 10 mEq  10 mEq Oral Sheran Spine D  Marino Rogerson, MD   10 mEq at 10/24/11 0803  . thiamine (B-1) injection 100 mg  100 mg Intramuscular Once Mike Craze, MD      . thiamine (VITAMIN B-1) tablet 100 mg  100 mg Oral Daily Mike Craze, MD   100 mg at 10/24/11 1016   Lab Results:  Results for orders placed during the hospital encounter of 10/22/11 (from the past 48 hour(s))  TSH     Status: Normal   Collection Time   10/23/11  7:37 PM      Component Value Range Comment   TSH 2.650  0.350 - 4.500 uIU/mL   T3, FREE     Status: Normal   Collection Time   10/23/11  7:37 PM      Component Value Range Comment   T3, Free 2.6  2.3 - 4.2 pg/mL   T4, FREE     Status: Abnormal   Collection Time   10/23/11  7:37 PM      Component Value Range Comment   Free T4 0.74 (*) 0.80 - 1.80 ng/dL    The patient was seen today and  reports that he is sleeping well without difficulty and reports a good appetite. He denies any significant depressive symptoms and adamantly denies any suicidal or homicidal ideations. The patient also denies any auditory or visual hallucinations or delusional thinking or any significant anxiety symptoms. He denies any symptoms of substance withdrawal or other concerns today.   Review of Systems:  Neurological: The patient denies any headaches today. He denies any seizures or dizziness.  G.I.: The patient denies any constipation or G.I. Upset today.  Musculoskeletal: The patient denies any musculoskeletal issues today.   Treatment Plan Summary:  1. Daily contact with patient to assess and evaluate symptoms and progress in treatment.  2. Medication management  3. The patient will deny suicidal ideations or homicidal ideations for 48 hours prior to discharge and have a depression and anxiety rating of 3 or less. The patient will also deny any auditory or visual hallucinations or delusional thinking.  4. The patient will deny any symptoms of substance withdrawal at time of discharge.   Plan:  1. Will continue the patient on his Librium Detox Protocol which was ordered at time of admission.  2. Laboratory studies reviewed.  3. Will continue the medication KCL 10 mEq po qam and hs x 6 doses for hypokalemia.  4. Will order a repeat CMP for the evening of 10/26/2011 to reassess the patient's hypokalemia. 5. Will continue to monitor.   Zoella Roberti 10/24/2011, 1:42 PM

## 2011-10-24 NOTE — Progress Notes (Signed)
Psychoeducational Group Note  Date:  10/24/2011 Time:  2000  Group Topic/Focus:  Wrap-Up Group:   The focus of this group is to help patients review their daily goal of treatment and discuss progress on daily workbooks.  Participation Level:  Active  Participation Quality:  Appropriate  Affect:  Appropriate  Cognitive:  Appropriate  Insight:  Good  Engagement in Group:  Good  Additional Comments:  Pt attended wrap up group. Pt didn't share much in group. Pt was pleasant.   Shanti Eichel A 10/24/2011, 9:52 PM
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
## 2011-10-24 NOTE — Progress Notes (Signed)
Psychoeducational Group Note  Date:  10/24/2011 Time:  2000  Group Topic/Focus:  Karaoke Group   Participation Level:  Active  Participation Quality:  Appropriate  Affect:  Appropriate  Cognitive:  Appropriate  Insight:  Good  Engagement in Group:  Good  Additional Comments:  Pt participated in karaoke group.   Anise Harbin A 10/24/2011, 3:19 AM Thayer Inabinet A 10/24/2011, 3:19 AM

## 2011-10-24 NOTE — Discharge Planning (Signed)
10/24/2011 10:02 AM  SW met with Jimmy [name] in discharge planning group.  SW found pt. Wants to go to [facility].  Jimmy [name] is thinking of an alternate plan for where he will go upon discharges if [facility] does not immediately have a bed available.  SW found Jimmy [name] to rate his depression at 0/10 and anxiety at 0/10 today.  Jimmy [name] denies AVH today.  SW will continue to monitor and assess for referrals.  Clarice Pole, LCASA 10/24/2011, 10:04 AM

## 2011-10-25 LAB — COMPREHENSIVE METABOLIC PANEL
Albumin: 3.4 g/dL — ABNORMAL LOW (ref 3.5–5.2)
BUN: 13 mg/dL (ref 6–23)
Chloride: 99 mEq/L (ref 96–112)
Creatinine, Ser: 0.86 mg/dL (ref 0.50–1.35)
GFR calc Af Amer: >90 mL/min (ref >90)
Glucose, Bld: 71 mg/dL (ref 70–99)
Total Bilirubin: 0.1 mg/dL — ABNORMAL LOW (ref 0.3–1.2)
Total Protein: 7.3 g/dL (ref 6.0–8.3)

## 2011-10-25 NOTE — Progress Notes (Signed)
Psychoeducational Group Note  Date:  10/25/2011 Time:  0945 am  Group Topic/Focus:  Identifying Needs:   The focus of this group is to help patients identify their personal needs that have been historically problematic and identify healthy behaviors to address their needs.  Participation Level:  Active  Participation Quality:  Appropriate  Affect:  Blunted  Cognitive:  Alert  Insight:  Limited  Engagement in Group:  Limited  Additional Comments:  Pt attended group on 300 hall  Andrena Mews 10/25/2011,11:11 AM

## 2011-10-25 NOTE — Progress Notes (Signed)
Patient ID: Jimmy [name], male   DOB: August 30, 1955, 56 y.o.   MRN: [id number] Was out in the dayroom this evening, attended group, interacting well with select peers, others his age.  Stated was tired this evening, and had gone to bed without his hs meds.  Were taken to him and he took them without issue.  Has been pleasant, cooperative, no c/o's voiced. A)  Will continue to monitor for safety, continue POC   R) Receptive, appreciative.

## 2011-10-25 NOTE — Progress Notes (Signed)
[facility] MD Progress Note                                         10/25/2011    Jimmy [name] 15-Jun-1955    [id number] Hospital day #3  OZ:HYQM I: Alcohol Dependence.  Cannabis Abuse The patient was seen today and reports the following:  Sleep: good Appetite: good  Mild>(1-10) >Severe  Depression (1-10): 0 Anxiety (1-10): 10/10 Hopelessness (1-10): 0  Suicidal Ideation: the patient denies suicidal ideation. Plan: None Intent: None Means:  None  Homicidal Ideation: the patient denies homicidal ideation. Plan: None Intent: None Means: None  Eye Contact: Good.  General Appearance: neat and fairly groomed Behavior: cooperative Motor Behavior: normal Speech:clear and goal directed  Mental Status:  Orientation x 3 Level of Consciousness:   alert Mood: euthymic Affect: congruent   Thought Process:linear  Thought Content: w/o AH/VH  Perception: intact   Judgment: fair  Insight: present  Cognition:at least average  Filed Vitals:   10/25/11 1000  BP: 125/82  Pulse: 100  Temp:   Resp:    . chlordiazePOXIDE  25 mg Oral TID   Followed by  . chlordiazePOXIDE  25 mg Oral BH-qamhs   Followed by  . chlordiazePOXIDE  25 mg Oral Daily  . multivitamin with minerals  1 tablet Oral Daily  . nicotine  21 mg Transdermal Q0600  . potassium chloride  10 mEq Oral BH-qamhs  . thiamine  100 mg Intramuscular Once  . thiamine  100 mg Oral Daily    Results for orders placed during the hospital encounter of 10/22/11 (from the past 48 hour(s))  COMPREHENSIVE METABOLIC PANEL     Status: Abnormal   Collection Time   10/25/11  7:30 PM      Component Value Range Comment   Sodium 136  135 - 145 mEq/L    Potassium 4.3  3.5 - 5.1 mEq/L    Chloride 99  96 - 112 mEq/L    CO2 28  19 - 32 mEq/L    Glucose, Bld 71  70 - 99 mg/dL    BUN 13  6 - 23 mg/dL    Creatinine, Ser 5.78  0.50 - 1.35 mg/dL    Calcium 9.6  8.4 - 46.9 mg/dL    Total Protein 7.3  6.0 - 8.3 g/dL    Albumin 3.4 (*)  3.5 - 5.2 g/dL    AST 35  0 - 37 U/L    ALT 29  0 - 53 U/L    Alkaline Phosphatase 65  39 - 117 U/L    Total Bilirubin 0.1 (*) 0.3 - 1.2 mg/dL    GFR calc non Af Amer >90  >90 mL/min    GFR calc Af Amer >90  >90 mL/min     No results found for this or any previous visit (from the past 48 hour(s)).  ROS:    Constitutional: WDWN AAF NAD   GI: Negative for N,V,D,C   Neuro: Negative for dizziness, blurred vision, visual changes, headaches   Resp: Negative for wheezing, SOB, cough   Cardio: Negative for CP, diaphoresis, fatigue   MSK: Negative for joint pain, swelling, DROM, or ambulatory difficulties.  Time was spent with the patient discussing his symptoms.  He states he is doing well and has no withdrawal symptoms. He reports no new symptoms. He asks about discharge, and is told it  could be as soon as tomorrow or Monday. Treatment Plan Summary:  1. Daily contact with patient to assess and evaluate symptoms and progress in treatment.  2. Medication management  3. The patient will deny suicidal ideations or homicidal ideations for 48 hours prior to discharge and have a depression and anxiety rating of 3 or less. The patient will also deny any auditory or visual hallucinations or delusional thinking.  4. The patient will deny any symptoms of substance withdrawal at time of discharge.  Plan:  1. Will continue the patient on his Librium Detox Protocol which was ordered at time of admission.  2. Laboratory studies reviewed.  3. Will continue the medication KCL 10 mEq po qam and hs x 6 doses for hypokalemia.  4. Will order a repeat CMP for the evening of 10/26/2011 to reassess the patient's hypokalemia.  5. Will continue to monitor.   Rona Ravens.Lexine Jaspers [facility] 10/25/2011

## 2011-10-25 NOTE — Progress Notes (Signed)
D   Pt has been quiet and cooperative  He did complain of groups on the 400 hall and said he was not getting what he needed  He was sent to 300 groups to address addiction issues  His thinking is logical and coherent   He denies suicidal and homicidal ideation  He denies auditory and visual hallucinations   He denies any signs and symptoms of withdrawal A   Verbal support given  Medications administered and effectiveness monitored   Will continue to monitor for withdrawal symptoms   Q 15 min checks R   Pt safe at present  Verbalizes knowledge of withdrawal symptoms and agreed to seek staff if having any

## 2011-10-25 NOTE — Progress Notes (Signed)
Psychoeducational Group Note  Date:  10/25/2011 Time:  1515  Group Topic/Focus:  Goals Group:   The focus of this group is to help patients establish daily goals to achieve during treatment and discuss how the patient can incorporate goal setting into their daily lives to aide in recovery.  Participation Level:  Minimal  Participation Quality:  Appropriate  Affect:  Appropriate  Cognitive:  Appropriate  Insight:  Good  Engagement in Group:  Good  Additional Comments:  Pt participated and processed in group.  Marquis Lunch, Jamien Casanova 10/25/2011, 7:01 PM

## 2011-10-25 NOTE — Progress Notes (Signed)
[facility] Group Notes:  (Counselor/Nursing/MHT/Case Management/Adjunct)  10/25/2011 11 AM  Type of Therapy:  Aftercare Planning, Group Therapy, Dance/Movement Therapy   Participation Level:  Did Not Attend   Jimmy [name] 10/25/2011. 11:40 AM  

## 2011-10-26 DIAGNOSIS — F1210 Cannabis abuse, uncomplicated: Secondary | ICD-10-CM

## 2011-10-26 DIAGNOSIS — F1020 Alcohol dependence, uncomplicated: Principal | ICD-10-CM

## 2011-10-26 NOTE — Progress Notes (Signed)
D   Pt appears depressed and sad  He did attend group but did not participate   His interactions with others are minimal and he rarely initiates interaction   Pt was programing on the 300 but said he does not want to go to groups on 300 anymore   He usually isolates in his room  He denies any signs or symptoms of withdrawal and is calm A   Verbal support given  Medications administered and effectiveness monitored   Q 15 min checks R   Pt safe at present

## 2011-10-26 NOTE — Progress Notes (Signed)
[facility] Group Notes:  (Counselor/Nursing/MHT/Case Management/Adjunct)  10/26/2011 11 AM  Type of Therapy:  Aftercare Planning, Group Therapy, Dance/Movement Therapy   Participation Level:  Did Not Attend  Summary of Progress/Problems: pt did not attend but did accept the daily workbook on support systems.  [facility] 10/26/2011. 11:38 AM

## 2011-10-26 NOTE — Progress Notes (Signed)
[facility] MD Progress Note                                         10/26/2011    Jimmy [name] Aug 11, 1955    [id number] Hospital day #4 1. Alcohol dependence   2. Cannabis abuse   The patient was seen today and reports the following:  Sleep: good Appetite: good  Mild>(1-10) >Severe  Depression (1-10):0 Anxiety (1-10): 5/10 Hopelessness (1-10): optimistic for his future   Suicidal Ideation: the patient denies suicidal ideation. Plan: None Intent: None Means:  None  Homicidal Ideation: the patient denies homicidal ideation. Plan: None Intent: None Means: None  Eye Contact: Good.  General Appearance: neat and clean Behavior: cooperative Motor Behavior: normal Speech:clear and goal directed  Mental Status:  Orientation x 4 Level of Consciousness:   alert Mood: euthymic Affect:congruent   Thought Process: linear Thought Content: normal no AH/VH Perception: intact  Judgment: intact Insight: limited Cognition: average  VS: height is 5\' 8"  (1.727 m) and weight is 66.679 kg (147 lb). His oral temperature is 98.2 F (36.8 C). His blood pressure is 117/84 and his pulse is 97. His respiration is 20.  Current Medication:  . chlordiazePOXIDE  25 mg Oral BH-qamhs   Followed by  . chlordiazePOXIDE  25 mg Oral Daily  . multivitamin with minerals  1 tablet Oral Daily  . nicotine  21 mg Transdermal Q0600  . potassium chloride  10 mEq Oral BH-qamhs  . thiamine  100 mg Intramuscular Once  . thiamine  100 mg Oral Daily    Lab results: Results for orders placed during the hospital encounter of 10/22/11 (from the past 48 hour(s))  COMPREHENSIVE METABOLIC PANEL     Status: Abnormal   Collection Time   10/25/11  7:30 PM      Component Value Range Comment   Sodium 136  135 - 145 mEq/L    Potassium 4.3  3.5 - 5.1 mEq/L    Chloride 99  96 - 112 mEq/L    CO2 28  19 - 32 mEq/L    Glucose, Bld 71  70 - 99 mg/dL    BUN 13  6 - 23 mg/dL    Creatinine, Ser 4.40  0.50 - 1.35 mg/dL      Calcium 9.6  8.4 - 10.5 mg/dL    Total Protein 7.3  6.0 - 8.3 g/dL    Albumin 3.4 (*) 3.5 - 5.2 g/dL    AST 35  0 - 37 U/L    ALT 29  0 - 53 U/L    Alkaline Phosphatase 65  39 - 117 U/L    Total Bilirubin 0.1 (*) 0.3 - 1.2 mg/dL    GFR calc non Af Amer >90  >90 mL/min    GFR calc Af Amer >90  >90 mL/min     No results found for this or any previous visit for  Last 48 hours.   ROS:    Constitutional: WDWN Adult in NAD   GI: Negative for N,V,D,C   Neuro: Negative for dizziness, blurred vision, visual changes, headaches   Resp: Negative for wheezing, SOB, cough   Cardio: Negative for CP, diaphoresis, fatigue   MSK: Negative for joint pain, swelling, DROM, or ambulatory difficulties.  Time was spent with the patient discussing the current symptoms and the response to treatment. He has no withdrawal symptoms and is  ready for moving on with his recovery. He states he wants to go to [facility] in [facility] and states they will give you a room. He does not have a back up plan if there is no bed available with [facility] tomorrow.  Treatment Plan Summary:  1. Daily contact with patient to assess and evaluate symptoms and progress in treatment.  2. Medication management  3. The patient will deny suicidal ideations or homicidal ideations for 48 hours prior to discharge and have a depression and anxiety rating of 3 or less. The patient will also deny any auditory or visual hallucinations or delusional thinking.  4. The patient will deny any symptoms of substance withdrawal at time of discharge.  Plan:  1. Will continue the patient on his Librium Detox Protocol which was ordered at time of admission.  2. Patient is ready for discharge tomorrow and will be referred to [facility] in [facility] as is his request.  3. He is instructed to speak with the Case Manager today to prepare a back up plan or to secure a list of other options if there are no beds available at [facility]. 4. Medication samples will be ordered for his discharge tomorrow and prescriptions will be readied. 5. He is strongly encouraged to attend discharge planning group in the AM to follow up with his CM 6 Will order a repeat CMP for the evening of 10/26/2011 to reassess the patient's hypokalemia.  7. Will continue to monitor.   Rona Ravens. Nori Poland [facility] 10/26/2011

## 2011-10-26 NOTE — Progress Notes (Signed)
Psychoeducational Group Note  Date:  10/26/2011 Time:  0945 am  Group Topic/Focus:  Making Healthy Choices:   The focus of this group is to help patients identify negative/unhealthy choices they were using prior to admission and identify positive/healthier coping strategies to replace them upon discharge.  Participation Level:  None  Participation Quality:  Attentive  Affect:  Depressed  Cognitive:  Alert  Insight:  Limited  Engagement in Group:  None  Additional Comments:  Pt attended group , did not participate but did pay attention  Andrena Mews 10/26/2011, 10:29 AM

## 2011-10-26 NOTE — Progress Notes (Signed)
[facility] Group Notes:  (Counselor/Nursing/MHT/Case Management/Adjunct)  10/26/2011 10:25 PM  Type of Therapy:  wrap-up  Participation Level:  Minimal  Participation Quality:  Appropriate  Affect:  Appropriate  Cognitive:  Appropriate  Insight:  Good  Engagement in Group:  Good  Engagement in Therapy:  Good  Modes of Intervention:  Education  Summary of Progress/Problems: Pt. Attended and participated in group tonight. He reports that he had a good day. He took a nap, and went outside. He wanted to attend AA when he moves to [place].   Lita Mains [facility] 10/26/2011, 10:25 PM

## 2011-10-26 NOTE — Progress Notes (Signed)
Patient ID: Jimmy [name], male   DOB: 12/28/1955, 56 y.o.   MRN: [id number] Has been quiet and isolative this evening, interacts minimally with a select peers, came to group with some encouragement.  Went back to his room after group,  Denies having any withdrawal symptoms and didn't require any prn's.  Will continue to monitor for safety.

## 2011-10-27 NOTE — Progress Notes (Addendum)
[facility] Group Notes:  (Counselor/Nursing/MHT/Case Management/Adjunct)  10/27/2011  2:30  PM  Type of Therapy: Group Therapy   Participation Level: Minimal   Participation Quality: Limited  Affect: Blunted, Depresssed  Cognitive: oriented, alert   Insight: Poor  Engagement in Group: Limited  Modes of Intervention: Clarification, Education, Problem-solving, Socialization,  Encouragement and Support, Activity   Summary of Progress/Problems: Pt participated in group by listening attentively and self disclosing.  After a brief check-in, therapist introduced the topic of overcoming barriers and obstacles to their wellness and recovery.  Therapist guided patients to discuss their thoughts, feeling and behaviors related to these barriers.    Therapist encouraged patients to identify areas in their lives that are out of balance, and give personal examples.  Therapist guided patients in problem solving ways to get these areas back in balance.  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   Pt was minimally engaged in the process. Therapist offered support and encouragement.  Some Progress noted.  Intervention effective.         Marni Griffon C 10/27/2011  2:30 PM

## 2011-10-27 NOTE — Progress Notes (Signed)
Patient ID: Jimmy [name], male   DOB: 06-Dec-1955, 56 y.o.   MRN: [id number] Patient's self inventory sheet, sleeps fair, has good appetite, normal energy level, good attention span.   Rated depression and hopelessness zero.  No withdrawals.   Denied SI.  No physical problems in past 24 hours.  Zero pain goal, worst pain #1.  After discharge, plans to go to meetings AA-NA.  Not sure of discharge plans.  No problems taking meds after discharge. Patient has talked about going to [place] for interview or to [facility] in [place].  Stated he is looking forward to discharge.   Denied SI and HI.   Denied A/V hallucinations.   Denied pain.

## 2011-10-27 NOTE — Progress Notes (Signed)
Psychoeducational Group Note  Date:  10/27/2011 Time:  1515  Group Topic/Focus:  Self Care:   The focus of this group is to help patients understand the importance of self-care in order to improve or restore emotional, physical, spiritual, interpersonal, and financial health.  Participation Level:  Active  Participation Quality:  Appropriate and Attentive  Affect:  Appropriate  Cognitive:  Appropriate  Insight:  Good  Engagement in Group:  Good  Additional Comments:  Pt participated and processed in group.  Marquis Lunch, Elyana Grabski 10/27/2011, 4:39 PM

## 2011-10-27 NOTE — Progress Notes (Signed)
Discharge Note:   Patient discharged with bus tickets going to [employer].   Denied SI and HI.   Denied A/V hallucinations.  Denied pain.   Stated he appreciated all the staff has done to assist him while at [facility].  Patient received all his belongings and discharge papers.

## 2011-10-27 NOTE — Progress Notes (Signed)
Writer observed patient sitting in the dayroom watching tv, no interaction with peers. Patient reports having had a good day and enjoyed the weather outside earlier when they went outside. Patient voiced no complaints and was informed of programming on 300 hall for group and he reported that he did not want to attend. Patient was offered support and encouragement, denies having pain, -si/hi/a/v hall. Safety maintained on unit, will continue to monitor.

## 2011-10-27 NOTE — [facility] Suicide Risk Assessment (Signed)
Suicide Risk Assessment  Discharge Assessment     Demographic factors:  Male;Divorced or widowed;Caucasian;Low socioeconomic status  Current Mental Status Per Nursing Assessment::   On Admission:   (denies) At Discharge:  The patient was seen today and reports that he is sleeping well without difficulty and reports a good appetite. He denies any significant depressive symptoms and adamantly denies any suicidal or homicidal ideations. The patient also denies any auditory or visual hallucinations or delusional thinking or any significant anxiety symptoms. He denies any symptoms of substance withdrawal or other concerns today.  The patient states that he would like to be discharged today and this will be ordered.  Current Mental Status Per Physician:  Diagnosis:  Axis I: Alcohol Dependence.  Cannabis Abuse.   The patient was seen today and reports the following:   ADL's: Intact.  Sleep: The patient reports that he is sleeping well at night.  Appetite: The patient reports a good appetite this morning.   Mild>(1-10) >Severe  Hopelessness (1-10): 0  Depression (1-10): 0  Anxiety (1-10): 0   Suicidal Ideation: The patient adamantly denies any suicidal ideations today.  Plan: No  Intent: No  Means: No   Homicidal Ideation: The patient adamantly denies any homicidal ideations today.  Plan: No  Intent: No.  Means: No   General Appearance/Behavior: The patient remains friendly and cooperative today with this provider.  Eye Contact: Good.  Speech: Appropriate in rate and volume with no pressuring noted today.  Motor Behavior: wnl.  Level of Consciousness: Alert and Oriented x 3.  Mental Status: Alert and Oriented x 3.  Mood: Essentially euthymic today.  Affect: Appears Bright and full today.  Anxiety Level: No anxiety reported today.  Thought Process: wnl.  Thought Content: The patient denies any auditory or visual hallucinations today as well as any delusional thinking.    Perception: wnl.  Judgment: Good.  Insight: Good.  Cognition: Oriented to person, place and time.   Loss Factors: Loss of significant relationship;Financial problems / change in socioeconomic status  Historical Factors:  (denies)  Risk Reduction Factors:   Good access to healthcare.  Continued Clinical Symptoms:  Alcohol/Substance Abuse/Dependencies Previous Psychiatric Diagnoses and Treatments  Discharge Diagnoses:   AXIS I:   Alcohol Dependence.    Cannabis Abuse. AXIS II:   Deferred. AXIS III:   1. No Acute Illnesses Reported. AXIS IV:   Long Standing Substance Abuse Issues.  Unemployment.  Corporate treasurer.  Homelessness. AXIS V:   GAF at time of admission approximately 45.  GAF at time of discharge approximately 55.  Cognitive Features That Contribute To Risk:  Closed-mindedness    The patient was seen today and reports that he is sleeping well without difficulty and reports a good appetite. He denies any significant depressive symptoms and adamantly denies any suicidal or homicidal ideations. The patient also denies any auditory or visual hallucinations or delusional thinking or any significant anxiety symptoms. He denies any symptoms of substance withdrawal or other concerns today.  The patient states that he would like to be discharged today and this will be ordered.  Current Medications:    . chlordiazePOXIDE  25 mg Oral BH-qamhs   Followed by  . chlordiazePOXIDE  25 mg Oral Daily  . multivitamin with minerals  1 tablet Oral Daily  . nicotine  21 mg Transdermal Q0600  . potassium chloride  10 mEq Oral BH-qamhs  . thiamine  100 mg Intramuscular Once  . thiamine  100 mg Oral Daily  Review of Systems:  Neurological: The patient denies any headaches today. He denies any seizures or dizziness.  G.I.: The patient denies any constipation or G.I. Upset today.  Musculoskeletal: The patient denies any musculoskeletal issues today.   Treatment Plan Summary:  1.  Daily contact with patient to assess and evaluate symptoms and progress in treatment.  2. Medication management  3. The patient will deny suicidal ideations or homicidal ideations for 48 hours prior to discharge and have a depression and anxiety rating of 3 or less. The patient will also deny any auditory or visual hallucinations or delusional thinking.  4. The patient will deny any symptoms of substance withdrawal at time of discharge.   Plan:  1. Laboratory studies reviewed.  2. Will continue to monitor.  3. The patient will be discharged today at this request to outpatient follow up.  Suicide Risk:  Minimal: No identifiable suicidal ideation.  Patients presenting with no risk factors but with morbid ruminations; may be classified as minimal risk based on the severity of the depressive symptoms  Plan Of Care/Follow-up recommendations:  Activity:  As tolerated. Diet:  Heart Healthy Diet. Other:  Please keep all scheduled follow up appointments and abstain from any use of alcohol or illicit drugs.   Roper Tolson 10/27/2011, 4:20 PM

## 2011-10-29 NOTE — Progress Notes (Signed)
Patient Discharge Instructions:  After Visit Summary (AVS):   Faxed to:  10/29/2011 Psychiatric Admission Assessment Note:   Faxed to:  10/29/2011 Suicide Risk Assessment - Discharge Assessment:   Faxed to:  10/29/2011 Faxed/Sent to the Next Level Care provider:  10/29/2011  Faxed to [facility] @ [phone] And to [facility] @ [phone]  [name], 10/29/2011, 3:32 PM

## 2011-11-03 ENCOUNTER — Emergency Department (HOSPITAL_COMMUNITY): Payer: Self-pay

## 2011-11-03 ENCOUNTER — Emergency Department (HOSPITAL_COMMUNITY)
Admission: EM | Admit: 2011-11-03 | Discharge: 2011-11-03 | Disposition: A | Discharge status: Home / Self Care | Payer: Self-pay | Attending: Emergency Medicine | Admitting: Emergency Medicine

## 2011-11-03 ENCOUNTER — Encounter (HOSPITAL_COMMUNITY): Payer: Self-pay | Admitting: *Deleted

## 2011-11-03 DIAGNOSIS — F17200 Nicotine dependence, unspecified, uncomplicated: Secondary | ICD-10-CM | POA: Insufficient documentation

## 2011-11-03 DIAGNOSIS — Z760 Encounter for issue of repeat prescription: Secondary | ICD-10-CM | POA: Insufficient documentation

## 2011-11-03 DIAGNOSIS — F29 Unspecified psychosis not due to a substance or known physiological condition: Secondary | ICD-10-CM

## 2011-11-03 DIAGNOSIS — F209 Schizophrenia, unspecified: Secondary | ICD-10-CM | POA: Insufficient documentation

## 2011-11-03 DIAGNOSIS — F1994 Other psychoactive substance use, unspecified with psychoactive substance-induced mood disorder: Secondary | ICD-10-CM

## 2011-11-03 DIAGNOSIS — Z590 Homelessness: Secondary | ICD-10-CM

## 2011-11-03 DIAGNOSIS — F1020 Alcohol dependence, uncomplicated: Secondary | ICD-10-CM

## 2011-11-03 DIAGNOSIS — M79609 Pain in unspecified limb: Secondary | ICD-10-CM | POA: Insufficient documentation

## 2011-11-03 DIAGNOSIS — M7989 Other specified soft tissue disorders: Secondary | ICD-10-CM | POA: Insufficient documentation

## 2011-11-03 DIAGNOSIS — F1210 Cannabis abuse, uncomplicated: Secondary | ICD-10-CM

## 2011-11-03 DIAGNOSIS — F319 Bipolar disorder, unspecified: Secondary | ICD-10-CM | POA: Insufficient documentation

## 2011-11-03 LAB — COMPREHENSIVE METABOLIC PANEL
AST: 36 U/L (ref 0–37)
Albumin: 3.6 g/dL (ref 3.5–5.2)
Alkaline Phosphatase: 72 U/L (ref 39–117)
Chloride: 105 mEq/L (ref 96–112)
Potassium: 4.1 mEq/L (ref 3.5–5.1)
Total Bilirubin: 0.3 mg/dL (ref 0.3–1.2)
Total Protein: 7.7 g/dL (ref 6.0–8.3)

## 2011-11-03 LAB — CBC
MCHC: 33.4 g/dL (ref 30.0–36.0)
Platelets: 431 10*3/uL — ABNORMAL HIGH (ref 150–400)
RDW: 15 % (ref 11.5–15.5)
WBC: 6.5 10*3/uL (ref 4.0–10.5)

## 2011-11-03 LAB — RAPID URINE DRUG SCREEN, HOSP PERFORMED
Amphetamines: NOT DETECTED
Benzodiazepines: POSITIVE — AB
Opiates: NOT DETECTED
Tetrahydrocannabinol: NOT DETECTED

## 2011-11-03 MED ORDER — RISPERIDONE 2 MG PO TABS: 2.0000 mg | ORAL_TABLET | Freq: Two times a day (BID) | ORAL | Status: DC

## 2011-11-03 MED ORDER — RISPERIDONE 2 MG PO TABS
2.0000 mg | ORAL_TABLET | Freq: Every day | ORAL | Status: DC
  Administered 2011-11-03: 2 mg via ORAL
  Filled 2011-11-03: qty 1

## 2011-11-03 MED ORDER — IBUPROFEN 200 MG PO TABS: 600.0000 mg | ORAL_TABLET | Freq: Three times a day (TID) | ORAL | Status: DC | PRN

## 2011-11-03 MED ORDER — ACETAMINOPHEN 325 MG PO TABS: 650.0000 mg | ORAL_TABLET | ORAL | Status: DC | PRN

## 2011-11-03 MED ORDER — LORAZEPAM 1 MG PO TABS: 1.0000 mg | ORAL_TABLET | Freq: Three times a day (TID) | ORAL | Status: DC | PRN

## 2011-11-03 MED ORDER — NICOTINE 21 MG/24HR TD PT24
21.0000 mg | MEDICATED_PATCH | Freq: Every day | TRANSDERMAL | Status: DC
  Administered 2011-11-03: 21 mg via TRANSDERMAL
  Filled 2011-11-03: qty 1

## 2011-11-03 NOTE — ED Notes (Signed)
Pt presents saying he does not have enough money to get to [facility] in [place] for his meds. Has been off meds for a year. Sts he has been Dx with schizophrenia, bipolar. Hears voices that are sometimes command. Denies SI/HI. Also sts he wants to go to [place] to see his brother.
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
## 2011-11-03 NOTE — Progress Notes (Signed)
Pt has been cleared for discharge home by psychiatry. EDP notified and is in agreement with the disposition. CSW met with the pt who states he is able to contract for safety and desires to be discharged at this time. CSW completed no violence/no harm contracts and provided the pt with outpatient resources including [facility] and mobile crisis units. CSW consulted with ED CM who is able to provide medication assistance for indigent funds. CSW provided the pt with a bus pass. No further needs identified at this time.

## 2011-11-03 NOTE — ED Notes (Signed)
Pt awaiting meds to come from pharmacy.  Spoke to [employer], meds just waiting to have meds checked off by pharmacist.
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
## 2011-11-03 NOTE — Progress Notes (Signed)
[facility] ED CM consulted by  ED SW for possible medication admission.CM reviewed EPIC and midas Cm spoke with Thayer Ohm in [place] pharmacy to confirm pt is eligible for [facility] indigent medication assistance. Pending any non narcotic Rx at d/c

## 2011-11-03 NOTE — Progress Notes (Signed)
[facility] ED CM spoke with pt prior pt his d/c He confirms he is a self pay [employer] resident with no pcp. Discussed the importance of a pcp for f/u. Reviewed Health connect number to assist with finding self pay provider close to pt's residence. Reviewed resources for [facility], general medical clinics, medications-needymeds.com, [facility] out patient [facility]ies, housing, DSS, health Department and other resources in [employer]. Pt voiced understanding and appreciation of resources provided Pt states he will be going to [place] and leaving [place] soon Pt states he will be able to obtain a pcp in [place] to assist with his health care and medications.  [facility] indigent Medication assistance provided for risperdal
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
## 2011-11-03 NOTE — ED Notes (Signed)
Security escorting pt to pharmacy, per Security will bring pt back up to get clothes and then to cashier.

## 2011-11-03 NOTE — ED Notes (Signed)
Patient transported to X-ray 

## 2011-11-09 ENCOUNTER — Emergency Department (HOSPITAL_COMMUNITY)
Admission: EM | Admit: 2011-11-09 | Discharge: 2011-11-11 | Disposition: A | Discharge status: Other Acute Inpatient Hospital | Payer: Self-pay | Attending: Emergency Medicine | Admitting: Emergency Medicine

## 2011-11-09 ENCOUNTER — Encounter (HOSPITAL_COMMUNITY): Payer: Self-pay | Admitting: *Deleted

## 2011-11-09 DIAGNOSIS — F10929 Alcohol use, unspecified with intoxication, unspecified: Secondary | ICD-10-CM

## 2011-11-09 DIAGNOSIS — F3289 Other specified depressive episodes: Secondary | ICD-10-CM | POA: Insufficient documentation

## 2011-11-09 DIAGNOSIS — F411 Generalized anxiety disorder: Secondary | ICD-10-CM | POA: Insufficient documentation

## 2011-11-09 DIAGNOSIS — F329 Major depressive disorder, single episode, unspecified: Secondary | ICD-10-CM | POA: Insufficient documentation

## 2011-11-09 DIAGNOSIS — R5381 Other malaise: Secondary | ICD-10-CM | POA: Insufficient documentation

## 2011-11-09 DIAGNOSIS — F1010 Alcohol abuse, uncomplicated: Secondary | ICD-10-CM | POA: Insufficient documentation

## 2011-11-09 DIAGNOSIS — F1020 Alcohol dependence, uncomplicated: Secondary | ICD-10-CM | POA: Insufficient documentation

## 2011-11-09 DIAGNOSIS — R45851 Suicidal ideations: Secondary | ICD-10-CM | POA: Insufficient documentation

## 2011-11-09 HISTORY — DX: Mental disorder, not otherwise specified: F99

## 2011-11-09 NOTE — Discharge Summary (Signed)
Physician Discharge Summary Note  Patient:  Jimmy [name] is an 56 y.o., male MRN:  [id number] DOB:  06/30/1955 Patient phone:  [phone] (home)  Patient address:   [facility] [address] [place] [address]   Date of Admission:  10/22/2011 Date of Discharge: 10/27/2011  Discharge Diagnoses: Principal Problem:  *Alcohol dependence Active Problems:  Cannabis abuse  Axis Diagnosis:  AXIS I: Alcohol Dependence.   Cannabis Abuse.  AXIS II: Deferred.  AXIS III: 1. No Acute Illnesses Reported.  AXIS IV: Long Standing Substance Abuse Issues. Unemployment. Corporate treasurer. Homelessness.  AXIS V: GAF at time of admission approximately 45. GAF at time of discharge approximately 55.   Level of Care:  Inpatient Psychiatric Hospitalization.  Reason For Admission: The patient is a 55 yr. Old Widowed white male who presented to the ED requesting detox from alcohol. He reports that he hears voices that tell him to do bad things, but he hasn't heard them lately.  Hospital Course:   The patient attended treatment team meeting this am and met with treatment team members. The patient's symptoms, treatment plan and response to treatment was discussed. The patient endorsed that their symptoms have improved. The patient also stated that they felt stable for discharge.  They reported that from this hospital stay they had learned many coping skills.  In other to maintain their psychiatric stability, they will continue psychiatric care on an outpatient basis. They will follow-up as outlined below.  In addition they were instructed  to take all your medications as prescribed by their mental healthcare provider and to report any adverse effects and or reactions from your medicines to their outpatient provider promptly.  The patient is also instructed and cautioned to not engage in alcohol and or illegal drug use while on prescription medicines.  In the event of worsening symptoms the patient is  instructed to call the crisis hotline, 911 and or go to the nearest ED for appropriate evaluation and treatment of symptoms.   Also while a patient in this hospital, the patient received medication management for his psychiatric symptoms. They were ordered and received as outlined below:  Medication List    Notice       You have not been prescribed any medications.            They were also enrolled in group counseling sessions and activities in which they participated actively.   Follow-up Information    Follow up with [facility]  on 10/28/2011. (Please follow-up tomorrow and attend a group to be eligible for interview for a bed in the residential program.  Pt. has to attend one group before being eligible for referral.  Groups are held M-F from 9-11am.)    Contact information:   [facility]  [address]  [place], [place]  [phone]         Follow up with [facility] on 10/28/2011. (Please follow-up with the walk-in clinic ASAP if you are unable to qualify for admission at [facility] to continue medication management.  Open M-F 8:30-3:30pm.)    Contact information:   [facility]  [address] [place], [place] [phone]        Upon discharge, patient adamantly denies suicidal, homicidal ideations, auditory, visual hallucinations and or delusional thinking. They left [facility] with all personal belongings via personal transportation in no apparent distress.  Consults:  Please see the patient's electronic medical records for more details.  Significant Diagnostic Studies:  Please see the  patient's electronic medical records for more details.  Discharge Vitals:   Blood pressure 120/78, pulse 86, temperature 97.8 F (36.6 C), temperature source Oral, resp. rate 18, height 5\' 8"  (1.727 m), weight 66.679 kg (147 lb)..  Mental Status Exam: Demographic factors:  Male;Divorced or widowed;Caucasian;Low socioeconomic status   Current Mental Status Per Nursing Assessment::  On  Admission: (denies)  At Discharge: The patient was seen today and reports that he is sleeping well without difficulty and reports a good appetite. He denies any significant depressive symptoms and adamantly denies any suicidal or homicidal ideations. The patient also denies any auditory or visual hallucinations or delusional thinking or any significant anxiety symptoms. He denies any symptoms of substance withdrawal or other concerns today. The patient states that he would like to be discharged today and this will be ordered.   Current Mental Status Per Physician:  Diagnosis:  Axis I: Alcohol Dependence.  Cannabis Abuse.   The patient was seen today and reports the following:   ADL's: Intact.  Sleep: The patient reports that he is sleeping well at night.  Appetite: The patient reports a good appetite this morning.   Mild>(1-10) >Severe  Hopelessness (1-10): 0  Depression (1-10): 0  Anxiety (1-10): 0   Suicidal Ideation: The patient adamantly denies any suicidal ideations today.  Plan: No  Intent: No  Means: No   Homicidal Ideation: The patient adamantly denies any homicidal ideations today.  Plan: No  Intent: No.  Means: No   General Appearance/Behavior: The patient remains friendly and cooperative today with this provider.  Eye Contact: Good.  Speech: Appropriate in rate and volume with no pressuring noted today.  Motor Behavior: wnl.  Level of Consciousness: Alert and Oriented x 3.  Mental Status: Alert and Oriented x 3.  Mood: Essentially euthymic today.  Affect: Appears Bright and full today.  Anxiety Level: No anxiety reported today.  Thought Process: wnl.  Thought Content: The patient denies any auditory or visual hallucinations today as well as any delusional thinking.  Perception: wnl.  Judgment: Good.  Insight: Good.  Cognition: Oriented to person, place and time.   Loss Factors:  Loss of significant relationship;Financial problems / change in socioeconomic  status   Historical Factors:  (denies)   Risk Reduction Factors:  Good access to healthcare.   Continued Clinical Symptoms:  Alcohol/Substance Abuse/Dependencies  Previous Psychiatric Diagnoses and Treatments   Discharge Diagnoses:  AXIS I: Alcohol Dependence.  Cannabis Abuse.  AXIS II: Deferred.  AXIS III: 1. No Acute Illnesses Reported.  AXIS IV: Long Standing Substance Abuse Issues. Unemployment. Corporate treasurer. Homelessness.  AXIS V: GAF at time of admission approximately 45. GAF at time of discharge approximately 55.   Cognitive Features That Contribute To Risk:  Closed-mindedness  The patient was seen today and reports that he is sleeping well without difficulty and reports a good appetite. He denies any significant depressive symptoms and adamantly denies any suicidal or homicidal ideations. The patient also denies any auditory or visual hallucinations or delusional thinking or any significant anxiety symptoms. He denies any symptoms of substance withdrawal or other concerns today. The patient states that he would like to be discharged today and this will be ordered.   Current Medications:  .  chlordiazePOXIDE  25 mg  Oral  BH-qamhs    Followed by   .  chlordiazePOXIDE  25 mg  Oral  Daily   .  multivitamin with minerals  1 tablet  Oral  Daily   .  nicotine  21 mg  Transdermal  Q0600   .  potassium chloride  10 mEq  Oral  BH-qamhs   .  thiamine  100 mg  Intramuscular  Once   .  thiamine  100 mg  Oral  Daily    Review of Systems:  Neurological: The patient denies any headaches today. He denies any seizures or dizziness.  G.I.: The patient denies any constipation or G.I. Upset today.  Musculoskeletal: The patient denies any musculoskeletal issues today.   Treatment Plan Summary:  1. Daily contact with patient to assess and evaluate symptoms and progress in treatment.  2. Medication management  3. The patient will deny suicidal ideations or homicidal ideations for  48 hours prior to discharge and have a depression and anxiety rating of 3 or less. The patient will also deny any auditory or visual hallucinations or delusional thinking.  4. The patient will deny any symptoms of substance withdrawal at time of discharge.   Plan:  1. Laboratory studies reviewed.  2. Will continue to monitor.  3. The patient will be discharged today at this request to outpatient follow up.   Suicide Risk:  Minimal: No identifiable suicidal ideation. Patients presenting with no risk factors but with morbid ruminations; may be classified as minimal risk based on the severity of the depressive symptoms   Plan Of Care/Follow-up recommendations:  Activity: As tolerated.  Diet: Heart Healthy Diet.  Other: Please keep all scheduled follow up appointments and abstain from any use of alcohol or illicit drugs.  Discharge destination:  Home.  Is patient on multiple antipsychotic therapies at discharge:  No  Has Patient had three or more failed trials of antipsychotic monotherapy by history: N/A Recommended Plan for Multiple Antipsychotic Therapies: N/A  Discharge Orders    Future Orders Please Complete By Expires   Diet - low sodium heart healthy      Increase activity slowly      Discharge instructions      Comments:   Please keep all scheduled follow up appointments and abstain from any use of alcohol or illicit drugs.     Medication List    Notice       You have not been prescribed any medications.            Follow-up Information    Follow up with [facility]  on 10/28/2011. (Please follow-up tomorrow and attend a group to be eligible for interview for a bed in the residential program.  Pt. has to attend one group before being eligible for referral.  Groups are held M-F from 9-11am.)    Contact information:   [facility]  [address]  [place], [place]  [phone]         Follow up with [facility] on 10/28/2011. (Please follow-up with the walk-in clinic  ASAP if you are unable to qualify for admission at [facility] to continue medication management.  Open M-F 8:30-3:30pm.)    Contact information:   [facility]  [address] [place], [place] [phone]        Follow-up recommendations:   Activities: Resume typical activities Diet: Resume typical diet Other: Follow up with outpatient provider and report any side effects to out patient prescriber.  Comments:  Take all your medications as prescribed by your mental healthcare provider. Report any adverse effects and or reactions from your medicines to your outpatient provider promptly. Patient is instructed and cautioned to not engage in alcohol and or illegal drug use  while on prescription medicines. In the event of worsening symptoms, patient is instructed to call the crisis hotline, 911 and or go to the nearest ED for appropriate evaluation and treatment of symptoms.  SignedFranchot Gallo 11/09/2011 6:31 PM

## 2011-11-09 NOTE — ED Notes (Signed)
The pt came in by [facility].  He called them from [employer] phone.  He is saying that he is  Suicidal and living on the street is killing him.  He was just at [facility] long ed last week and refused admission to the facility he was being sent to

## 2011-11-10 LAB — COMPREHENSIVE METABOLIC PANEL
ALT: 19 U/L (ref 0–53)
AST: 40 U/L — ABNORMAL HIGH (ref 0–37)
Albumin: 4 g/dL (ref 3.5–5.2)
CO2: 21 mEq/L (ref 19–32)
Calcium: 9.3 mg/dL (ref 8.4–10.5)
Creatinine, Ser: 0.6 mg/dL (ref 0.50–1.35)
GFR calc non Af Amer: >90 mL/min (ref >90)
Sodium: 135 mEq/L (ref 135–145)
Total Protein: 8.1 g/dL (ref 6.0–8.3)

## 2011-11-10 LAB — CBC
MCH: 32.6 pg (ref 26.0–34.0)
Platelets: 364 10*3/uL (ref 150–400)
RBC: 4.05 MIL/uL — ABNORMAL LOW (ref 4.22–5.81)
RDW: 14.7 % (ref 11.5–15.5)

## 2011-11-10 LAB — RAPID URINE DRUG SCREEN, HOSP PERFORMED
Amphetamines: NOT DETECTED
Barbiturates: NOT DETECTED
Benzodiazepines: NOT DETECTED
Cocaine: NOT DETECTED
Opiates: NOT DETECTED
Tetrahydrocannabinol: NOT DETECTED

## 2011-11-10 MED ORDER — ACETAMINOPHEN 325 MG PO TABS: 650.0000 mg | ORAL_TABLET | ORAL | Status: DC | PRN

## 2011-11-10 MED ORDER — ADULT MULTIVITAMIN W/MINERALS CH
1.0000 | ORAL_TABLET | Freq: Every day | ORAL | Status: DC
  Administered 2011-11-10: 1 via ORAL
  Filled 2011-11-10: qty 1

## 2011-11-10 MED ORDER — LORAZEPAM 1 MG PO TABS
1.0000 mg | ORAL_TABLET | Freq: Four times a day (QID) | ORAL | Status: DC | PRN
  Filled 2011-11-10: qty 1

## 2011-11-10 MED ORDER — ALUM & MAG HYDROXIDE-SIMETH 200-200-20 MG/5ML PO SUSP: 30.0000 mL | ORAL | Status: DC | PRN

## 2011-11-10 MED ORDER — LORAZEPAM 1 MG PO TABS: 0.0000 mg | ORAL_TABLET | Freq: Two times a day (BID) | ORAL | Status: DC

## 2011-11-10 MED ORDER — VITAMIN B-1 100 MG PO TABS
100.0000 mg | ORAL_TABLET | Freq: Every day | ORAL | Status: DC
  Administered 2011-11-10: 100 mg via ORAL
  Filled 2011-11-10: qty 1

## 2011-11-10 MED ORDER — ONDANSETRON HCL 8 MG PO TABS: 4.0000 mg | ORAL_TABLET | Freq: Three times a day (TID) | ORAL | Status: DC | PRN

## 2011-11-10 MED ORDER — LORAZEPAM 1 MG PO TABS
0.0000 mg | ORAL_TABLET | Freq: Four times a day (QID) | ORAL | Status: DC
  Administered 2011-11-10 (×3): 1 mg via ORAL
  Filled 2011-11-10 (×2): qty 1

## 2011-11-10 MED ORDER — FOLIC ACID 1 MG PO TABS
1.0000 mg | ORAL_TABLET | Freq: Every day | ORAL | Status: DC
  Administered 2011-11-10: 1 mg via ORAL
  Filled 2011-11-10: qty 1

## 2011-11-10 MED ORDER — THIAMINE HCL 100 MG/ML IJ SOLN: 100.0000 mg | Freq: Every day | INTRAMUSCULAR | Status: DC

## 2011-11-10 MED ORDER — LORAZEPAM 2 MG/ML IJ SOLN: 1.0000 mg | Freq: Four times a day (QID) | INTRAMUSCULAR | Status: DC | PRN

## 2011-11-10 MED ORDER — IBUPROFEN 200 MG PO TABS
600.0000 mg | ORAL_TABLET | Freq: Three times a day (TID) | ORAL | Status: DC | PRN
  Administered 2011-11-10: 600 mg via ORAL
  Filled 2011-11-10: qty 3
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
## 2011-11-10 NOTE — ED Provider Notes (Addendum)
History     CSN: [id number]  Arrival date & time 11/09/11  August 27, 2325   First MD Initiated Contact with Patient 11/09/11 2334/08/28      Chief Complaint  Patient presents with  . suicidal     (Consider location/radiation/quality/duration/timing/severity/associated sxs/prior treatment) HPI Comments: Jimmy [name] presents stating that he feels tired of being "down and out".  He reports that he is homeless and drinks alcohol daily.  He has a long history of alcohol; abuse and states that since his woman died in 08/27/09 he has been unable to hold employment or a steady address.  He reports that he is contemplating suicide and explains that his plan is to stop his heart from beating.  He is unable to elaborate on any specific details of how he would accomplish this.  He also states that he has not eaten in about 3 days.  Patient is a 56 y.o. male presenting with alcohol problem. The history is provided by the patient. No language interpreter was used.  Alcohol Problem This is a chronic problem. The problem occurs constantly. The problem has not changed since onset.Pertinent negatives include no chest pain, no abdominal pain, no headaches and no shortness of breath. Nothing aggravates the symptoms. Nothing relieves the symptoms.    Past Medical History  Diagnosis Date  . Alcohol abuse   . Schizophrenia   . Bipolar 1 disorder   . Mental disorder     Past Surgical History  Procedure Date  . Skin graft     No family history on file.  History  Substance Use Topics  . Smoking status: Current Everyday Smoker -- 1.5 packs/day for 30 years  . Smokeless tobacco: Not on file  . Alcohol Use: 12.6 oz/week    21 Cans of beer per week     drinks as much etoh as he can get per day      Review of Systems  Constitutional: Positive for fatigue. Negative for fever, chills, activity change and appetite change.  HENT: Negative.   Eyes: Negative.   Respiratory: Negative for cough, chest tightness, shortness  of breath and wheezing.   Cardiovascular: Negative for chest pain, palpitations and leg swelling.  Gastrointestinal: Negative for nausea, vomiting, abdominal pain and diarrhea.  Genitourinary: Negative.   Musculoskeletal: Negative.   Skin: Negative.   Neurological: Negative for headaches.  Hematological: Negative.   Psychiatric/Behavioral: Positive for suicidal ideas and dysphoric mood. Negative for hallucinations, behavioral problems, self-injury, decreased concentration and agitation. The patient is nervous/anxious.     Allergies  Review of patient's allergies indicates no known allergies.  Home Medications   Current Outpatient Rx  Name Route Sig Dispense Refill  . RISPERIDONE 2 MG PO TABS Oral Take 1 tablet (2 mg total) by mouth 2 (two) times daily. 30 tablet 0    BP 132/78  Pulse 109  Temp 98.5 F (36.9 C) (Oral)  Resp 18  SpO2 98%  Physical Exam  Nursing note and vitals reviewed. Constitutional: He is oriented to person, place, and time. He appears well-developed and well-nourished. No distress.       [name] clothing is soiled and he smells of alcohol  HENT:  Head: Normocephalic and atraumatic.  Right Ear: External ear normal.  Left Ear: External ear normal.  Nose: Nose normal.  Mouth/Throat: Oropharynx is clear and moist. No oropharyngeal exudate.  Eyes: Conjunctivae and EOM are normal. Pupils are equal, round, and reactive to light. Right eye exhibits no discharge. Left eye exhibits no discharge.  No scleral icterus.  Neck: Normal range of motion. Neck supple. No JVD present. No tracheal deviation present. No thyromegaly present.  Cardiovascular: Normal rate, regular rhythm, normal heart sounds and intact distal pulses.  Exam reveals no gallop and no friction rub.   Pulmonary/Chest: Effort normal and breath sounds normal. No stridor. No respiratory distress. He has no wheezes. He has no rales. He exhibits no tenderness.  Abdominal: Soft. Bowel sounds are normal. He  exhibits no distension and no mass. There is no tenderness. There is no rebound and no guarding.  Musculoskeletal: Normal range of motion. He exhibits no edema and no tenderness.  Lymphadenopathy:    He has no cervical adenopathy.  Neurological: He is alert and oriented to person, place, and time. He has normal strength. He displays no atrophy and no tremor. No cranial nerve deficit or sensory deficit. He exhibits normal muscle tone. He displays no seizure activity. Coordination normal. GCS eye subscore is 4. GCS verbal subscore is 5. GCS motor subscore is 6.  Skin: Skin is warm and dry. No rash noted. He is not diaphoretic. No erythema. No pallor.  Psychiatric: His behavior is normal. Judgment normal. His mood appears not anxious. His affect is not angry, not blunt, not labile and not inappropriate. His speech is not rapid and/or pressured, not delayed, not tangential and not slurred. He is not agitated, not aggressive, is not hyperactive, not slowed, not withdrawn, not actively hallucinating and not combative. Thought content is not paranoid and not delusional. Cognition and memory are normal. He exhibits a depressed mood. He expresses suicidal ideation. He expresses no homicidal ideation. He expresses no homicidal plans. He is communicative. He is attentive.    ED Course  Procedures (including critical care time)   Labs Reviewed  CBC  COMPREHENSIVE METABOLIC PANEL  ETHANOL  URINE RAPID DRUG SCREEN (HOSP PERFORMED)   No results found.   No diagnosis found.    MDM  Pt presents stating that he is considering suicide.  He also reports alcohol abuse and would like to seek help and counseling.  Will obtain routine psych labs and contact the ACT Team for evaluation.  After reviewing the labs will consult telepsych also.  He is demonstrating now evidence currently of EtOH withdrawal.   0300.  Pt has been evaluated bt the ACT Team member and appears appropriate for inpt behavioral health  evaluation and stabilization.  Plan continue ER obs until behavioral health bed is made available.  His blood alcohol level is elevated consistent with EtOH intoxication.  Will watch for evidence of alcohol withdrawal.  EtOH detox protocol initiated.   Tobin Chad, MD 11/10/11 [id number]  Tobin Chad, MD 11/10/11 [phone]

## 2011-11-10 NOTE — ED Notes (Signed)
Sitter at the bedside.  Pt sleepiong

## 2011-11-10 NOTE — ED Notes (Signed)
Pt taking a shower 

## 2011-11-10 NOTE — ED Notes (Signed)
Report given to Dennie Bible, RN.  Pt will be moved to POD C-25.  Pt made aware.  Sitter at bedside.

## 2011-11-10 NOTE — ED Notes (Signed)
Pt will be going to POD C-23.

## 2011-11-10 NOTE — ED Notes (Signed)
The pt  Has been placed on blue scrubs a-c and chg notified of sitter need

## 2011-11-10 NOTE — ED Notes (Signed)
Pt wanded by security. 

## 2011-11-10 NOTE — BH Assessment (Signed)
[facility] Assessment Progress Note      Update:  Received call from Rosalind after writer called to follow up and she reported that pt was on wait list for [facility] at 1820. 

## 2011-11-10 NOTE — ED Notes (Signed)
Act team seeing the pt

## 2011-11-11 ENCOUNTER — Inpatient Hospital Stay (HOSPITAL_COMMUNITY)
Admission: EM | Admit: 2011-11-11 | Discharge: 2011-11-12 | DRG: 897 | Disposition: A | Discharge status: Home / Self Care | Payer: Federal, State, Local not specified - Other | Source: Ambulatory Visit | Attending: Psychiatry | Admitting: Psychiatry

## 2011-11-11 ENCOUNTER — Encounter (HOSPITAL_COMMUNITY): Payer: Self-pay

## 2011-11-11 ENCOUNTER — Encounter (HOSPITAL_COMMUNITY): Payer: Self-pay | Admitting: *Deleted

## 2011-11-11 DIAGNOSIS — F1010 Alcohol abuse, uncomplicated: Principal | ICD-10-CM | POA: Diagnosis present

## 2011-11-11 DIAGNOSIS — F1210 Cannabis abuse, uncomplicated: Secondary | ICD-10-CM | POA: Diagnosis present

## 2011-11-11 DIAGNOSIS — F1020 Alcohol dependence, uncomplicated: Secondary | ICD-10-CM

## 2011-11-11 DIAGNOSIS — Z765 Malingerer [conscious simulation]: Secondary | ICD-10-CM

## 2011-11-11 MED ORDER — CHLORDIAZEPOXIDE HCL 25 MG PO CAPS
25.0000 mg | ORAL_CAPSULE | Freq: Four times a day (QID) | ORAL | Status: DC
  Administered 2011-11-11 – 2011-11-12 (×5): 25 mg via ORAL
  Filled 2011-11-11 (×5): qty 1

## 2011-11-11 MED ORDER — VITAMIN B-1 100 MG PO TABS
100.0000 mg | ORAL_TABLET | Freq: Every day | ORAL | Status: DC
  Administered 2011-11-12: 100 mg via ORAL
  Filled 2011-11-11 (×2): qty 1

## 2011-11-11 MED ORDER — ACETAMINOPHEN 325 MG PO TABS: 650.0000 mg | ORAL_TABLET | Freq: Four times a day (QID) | ORAL | Status: DC | PRN

## 2011-11-11 MED ORDER — ONDANSETRON 4 MG PO TBDP: 4.0000 mg | ORAL_TABLET | Freq: Four times a day (QID) | ORAL | Status: DC | PRN

## 2011-11-11 MED ORDER — CHLORDIAZEPOXIDE HCL 25 MG PO CAPS
50.0000 mg | ORAL_CAPSULE | Freq: Once | ORAL | Status: AC
  Administered 2011-11-11: 50 mg via ORAL
  Filled 2011-11-11: qty 2

## 2011-11-11 MED ORDER — THIAMINE HCL 100 MG/ML IJ SOLN
100.0000 mg | Freq: Once | INTRAMUSCULAR | Status: AC
  Administered 2011-11-11: 100 mg via INTRAMUSCULAR

## 2011-11-11 MED ORDER — CHLORDIAZEPOXIDE HCL 25 MG PO CAPS: 25.0000 mg | ORAL_CAPSULE | Freq: Four times a day (QID) | ORAL | Status: DC | PRN

## 2011-11-11 MED ORDER — CHLORDIAZEPOXIDE HCL 25 MG PO CAPS: 25.0000 mg | ORAL_CAPSULE | ORAL | Status: DC

## 2011-11-11 MED ORDER — HYDROXYZINE HCL 25 MG PO TABS: 25.0000 mg | ORAL_TABLET | Freq: Four times a day (QID) | ORAL | Status: DC | PRN

## 2011-11-11 MED ORDER — ALUM & MAG HYDROXIDE-SIMETH 200-200-20 MG/5ML PO SUSP: 30.0000 mL | ORAL | Status: DC | PRN

## 2011-11-11 MED ORDER — NICOTINE 21 MG/24HR TD PT24
21.0000 mg | MEDICATED_PATCH | Freq: Every day | TRANSDERMAL | Status: DC
  Administered 2011-11-11 – 2011-11-12 (×2): 21 mg via TRANSDERMAL
  Filled 2011-11-11 (×4): qty 1

## 2011-11-11 MED ORDER — MAGNESIUM HYDROXIDE 400 MG/5ML PO SUSP: 30.0000 mL | Freq: Every day | ORAL | Status: DC | PRN

## 2011-11-11 MED ORDER — CHLORDIAZEPOXIDE HCL 25 MG PO CAPS: 25.0000 mg | ORAL_CAPSULE | Freq: Three times a day (TID) | ORAL | Status: DC

## 2011-11-11 MED ORDER — CHLORDIAZEPOXIDE HCL 25 MG PO CAPS: 25.0000 mg | ORAL_CAPSULE | Freq: Every day | ORAL | Status: DC

## 2011-11-11 MED ORDER — LOPERAMIDE HCL 2 MG PO CAPS: 2.0000 mg | ORAL_CAPSULE | ORAL | Status: DC | PRN

## 2011-11-11 MED ORDER — ADULT MULTIVITAMIN W/MINERALS CH
1.0000 | ORAL_TABLET | Freq: Every day | ORAL | Status: DC
  Administered 2011-11-11 – 2011-11-12 (×2): 1 via ORAL
  Filled 2011-11-11 (×3): qty 1

## 2011-11-11 NOTE — Progress Notes (Signed)
[facility] Group Notes:  (Counselor/Nursing/MHT/Case Management/Adjunct) Tuesday November 11, 2011 from 1:15 to 2:30    Type of Therapy:  Group Therapy   Participation Level:  Did Not Attend  Jimmy [name] 11/11/2011, 5:33 PM

## 2011-11-11 NOTE — Progress Notes (Deleted)
Psychoeducational Group Note  Date:  11/11/2011 Time:  2000  Group Topic/Focus:  Wrap-Up Group:   The focus of this group is to help patients review their daily goal of treatment and discuss progress on daily workbooks.  Participation Level:  Active  Participation Quality:  Appropriate  Affect:  Appropriate  Cognitive:  Appropriate  Insight:  Good  Engagement in Group:  Good  Additional Comments:  Patient attended AA meeting tonight.  Shah Insley, Newton Pigg 11/11/2011, 8:55 PM

## 2011-11-11 NOTE — H&P (Signed)
Pt seen and evaluated upon admission.  Completed Admission Suicide Risk Assessment.  See orders.  Pt agreeable with plan.  Discussed with team.   

## 2011-11-11 NOTE — Progress Notes (Signed)
Patient in bed, seen to complete PSA Update, states he is ready for discharge as he no longer wants treatment and wishes to return home, drink beer and smoke cigarettes.   Clide Dales 11/11/2011 1:01 PM

## 2011-11-11 NOTE — Progress Notes (Signed)
Psychoeducational Group Note  Date:  11/11/2011 Time:  1100  Group Topic/Focus:  Recovery Goals:   The focus of this group is to identify appropriate goals for recovery and establish a plan to achieve them.  Participation Level:  Active  Participation Quality:  Appropriate  Affect:  Appropriate  Cognitive:  Alert, Appropriate and Oriented  Insight:  Good  Engagement in Group:  Good  Additional Comments:  Pt. Listened attentively in group and filled out worksheet on recovery goals  Meredith Staggers 11/11/2011, 2:02 PM

## 2011-11-11 NOTE — Progress Notes (Signed)
11/11/2011         Time: 1500      Group Topic/Focus: The focus of this group is on enhancing patients' problem solving skills, which involves identifying the problem, brainstorming solutions and choosing and trying a solution.  Participation Level: Active  Participation Quality: Attentive  Affect: Blunted  Cognitive: Oriented   Additional Comments: None.    Jimmy [name] 11/11/2011 3:50 PM

## 2011-11-11 NOTE — Progress Notes (Signed)
Psychoeducational Group Note  Date:  11/11/2011 Time: 2000  Group Topic/Focus:  Wrap-Up Group:   The focus of this group is to help patients review their daily goal of treatment and discuss progress on daily workbooks.  Participation Level:  Active  Participation Quality:  Alert  Affect:    Cognitive:  Alert  Insight:    Engagement in Group:  Alert  Additional Comments:  Patient did attend AA meeting tonight.  Deyanna Mctier, Newton Pigg 11/11/2011, 9:50 PM
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
## 2011-11-11 NOTE — Progress Notes (Signed)
Patient ID: Jimmy [name], male   DOB: Mar 11, 1956, 56 y.o.   MRN: [id number] He has been up for medication and meals only.  Has refused to attend groups, stated that he wanted to leave and did sign a 72 hour request to leave. Dr  Audry Pili informed.   He denies having withdrawal symptoms and wants to leave.

## 2011-11-11 NOTE — Discharge Planning (Signed)
Pt did not attend discharge planning group.   

## 2011-11-11 NOTE — Tx Team (Signed)
Initial Interdisciplinary Treatment Plan  PATIENT STRENGTHS: (choose at least two) Ability for insight Capable of independent living Communication skills  PATIENT STRESSORS: Financial difficulties Substance abuse Being Homeless   PROBLEM LIST: Problem List/Patient Goals Date to be addressed Date deferred Reason deferred Estimated date of resolution   Depression 11/11/11     Substance abuse 11/11/11                                                DISCHARGE CRITERIA:  Ability to meet basic life and health needs Improved stabilization in mood, thinking, and/or behavior Withdrawal symptoms are absent or subacute and managed without 24-hour nursing intervention  PRELIMINARY DISCHARGE PLAN: Attend 12-step recovery group Outpatient therapy Placement in alternative living arrangements  PATIENT/FAMIILY INVOLVEMENT: This treatment plan has been presented to and reviewed with the patient, Jimmy [name], and/or family member.  The patient and family have been given the opportunity to ask questions and make suggestions.  Omelia Blackwater Violon 11/11/2011, 3:42 AM

## 2011-11-11 NOTE — Progress Notes (Signed)
[facility] Adult Inpatient Family/Significant Other Suicide Prevention Education  Suicide Prevention Education:  Patient Refusal for Family/Significant Other Suicide Prevention Education: The patient Jimmy [name] has refused to provide written consent for family/significant other to be provided Family/Significant Other Suicide Prevention Education during admission and/or prior to discharge.  Physician notified. Makiah did suggest counselor speak with his brother who is also a patient here on [address]. Will consult with treatment team  Clide Dales 11/11/2011, 1:08 PM
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
## 2011-11-11 NOTE — Progress Notes (Signed)
Psychoeducational Group Note  Date:  11/11/2011 Time:  1000  Group Topic/Focus:  Goals Group:   The focus of this group is to help patients establish daily goals to achieve during treatment and discuss how the patient can incorporate goal setting into their daily lives to aide in recovery.  Participation Level:  Minimal  Participation Quality:  Resistant  Affect:  Appropriate  Cognitive:  Alert, Appropriate and Oriented  Insight:  Limited  Engagement in Group:  Limited  Additional Comments:  Jimmy [name] came late but did attend goals group, participated minimally in discussion on what S.M.A.R.T. Goals are, how to use them and their benefits. Jimmy [name] participated in activity writing one goals he would like to accomplish today on a leaf and adding it to the hall tree.Jimmy [name] stated his goal was to discharge as he does nto want treatment.  [name] 11/11/2011, 11:54 AM

## 2011-11-11 NOTE — Progress Notes (Signed)
Adult Psychosocial Assessment Update Interdisciplinary Team  [facility] admissions/discharges:  Admissions Discharges  Date: 10/22/11 Date: 10/27/11  Date: Date:  Date: Date:  Date: Date:  Date: Date:   Changes since the last Psychosocial Assessment (including adherence to outpatient mental health and/or substance abuse treatment, situational issues contributing to decompensation and/or relapse). Patient states he was not given enough bus passes to get from here to [employer]  Earlier this month at discharge on 10/27/11, thus went home and began drinking again with  In three days.  Patient reports stressors included disagreement with eldest brother (not   The brother currently at [facility]) and increased alcohol consumption. Patient now reporting   He wishes to "return home and resume smoking cigarettes and drinking"     Discharge Plan 1. Will you be returning to the same living situation after discharge?   Yes:X No:      If no, what is your plan?           2. Would you like a referral for services when you are discharged? Yes:     If yes, for what services?  No:  "I said NO!"            Summary and Recommendations (to be completed by the evaluator) Patient is a 56 YO widowed unemployed caucasian male admitted with diagnosis of   Major Depression, Recurrent, Severe and Alcohol Dependence.Patient's self report   Today differs from emergency department report. Patient will benefit from crisis stab-  Iization, medication evaluation, group therapy and psycho education in addition to case    management for discharge planning.               Signature:  Clide Dales, 11/11/2011 5:56 PM

## 2011-11-11 NOTE — Progress Notes (Signed)
Patient ID: Jimmy [name], male   DOB: 1955-07-03, 56 y.o.   MRN: [id number] Pt is voluntary. Pt called from a [employer] pay phone stating that he was suicidal and was homeless and that living in the woods and streets is killing him. Pt has recently been to ED but had refused care. Pt states that he drinks 21 cans of beer a week or how every much he can get. Pt UDS was negative but he states when he is around [facility] or cocaine he does use it. Pt wants to find housing and do better. Pt states he is depressed at a 5, no anxiety, and hopeless/helplessness at a 10. Pt states he had been depressed ever since 2011 when his significant other died and since then has not been able to keep a job or a stable living environment. Pt states he has financial stressors. Pt CIWA at admission was a 5 more due to having night sweats. Pt would like to live here and go to [facility] [place].

## 2011-11-12 DIAGNOSIS — F1020 Alcohol dependence, uncomplicated: Secondary | ICD-10-CM

## 2011-11-12 NOTE — Progress Notes (Addendum)
[facility] Adult Inpatient Family/Significant Other Suicide Prevention Education  Suicide Prevention Education:  Patient Refusal for Family/Significant Other Suicide Prevention Education: The patient Jimmy [name] has refused to provide written consent for family/significant other to be provided Family/Significant Other Suicide Prevention Education during admission and/or prior to discharge.  Physician notified. Writer provided suicide prevention education directly to patient after treatment team decision to discharge today; conversation included risk factors, warning signs and resources to contact for help. Mobile crisis services explained and contact card placed in chart for pt to receive at discharge.  Patient also stated the Homicidal Ideation he reported prior to admit had also decreased and he would not hurt his brother who lives outside of [place].  "It was just an argument over money, nothing to hurt my brother over."  Clide Dales 11/12/2011, 5:03 PM

## 2011-11-12 NOTE — Discharge Planning (Signed)
Pt was seen this morning during discharge planning group.  Pt stated that he admitted to the hospital because he was tired of living in a wet tent.  Pt stated that his anxiety and depression level was at a zero.  Pt denied SI/HI/AVH.  Pt stated that upon discharge he would return to live in his tent in behind [place].  Pt refused follow up appts and demanded to be discharged today.

## 2011-11-12 NOTE — [facility] Suicide Risk Assessment (Signed)
Suicide Risk Assessment  Discharge Assessment      Demographic factors:  Male;Caucasian;Living alone;Unemployed;Low socioeconomic status  Current Mental Status Per Nursing Assessment: On Admission:   (Denies SI/HI) At Discharge: Pt denied any SI/HI/thoughts of self harm or acute psychiatric issues in treatment team with clinical, nursing and medical team present.  Current Mental Status Per Physician: Patient seen and evaluated. Chart reviewed. Patient stated that his mood was "good". His affect was mood congruent and euthymic. He denied any current thoughts of self injurious behavior, suicidal ideation or homicidal ideation. No current anger towards brother(s). He denied any significant depressive signs or symptoms at this time. There were no auditory or visual hallucinations, paranoia, delusional thought processes, or mania noted.  Thought process was linear and goal directed.  No psychomotor agitation or retardation was noted. His speech was normal rate, tone and volume. Eye contact was good. Judgment and insight are fair.  Patient has been up and engaged on the unit.  No acute safety concerns reported from team.  Pt requesting discharge and does not want to stop drinking.  York Spaniel he came to the hospital cause he needed "food and a dry bed".  Loss Factors:  Loss of significant relationship;Financial problems / change in socioeconomic status; homeless-requesting to return to his tent and not the shelter   Historical Factors:  Hx legal charges; assault and violent charges in past  Risk Reduction Factors:  Good access to healthcare.   Discharge Diagnoses: Alcohol Use Disorder; Cannabis Use Disorder, mild. Malingering, for Tx upon admission.   Cognitive Features That Contribute To Risk: limited insight.  Suicide Risk: Pt viewed as a chronic increased risk of harm to self and others in light of his past hx and risk factors.  No acute safety concerns on the unit.  Pt contracting for safety and  has been requesting discharge since admission from ED.  Plan Of Care/Follow-up recommendations: Pt seen and evaluated in treatment team. Chart reviewed.  Pt stable for and requesting discharge. Pt contracting for safety and does not currently meet [place] involuntary commitment criteria for continued hospitalization against his will.  Mental health treatment, medication management and continued sobriety will mitigate against the increased risk of harm to self and/or others.  Discussed the importance of recovery further with pt, as well as, tools to move forward in a healthy & safe manner.  Pt agreeable with the plan.  Discussed with the team.  Please see orders, follow up appointments per AVS and full discharge summary. Recommend follow up with AA/NA.  Diet: Regular.  Activity: As tolerated.     Lupe Carney 11/12/2011, 10:39 AM

## 2011-11-12 NOTE — Progress Notes (Signed)
Patient ID: Jimmy [name], male   DOB: 1956-01-25, 55 y.o.   MRN: [id number]  Problem: Alcohol Dependence, Marijuana abuse  D: Pt with minimal interaction; no inappropriate behaviors noted.  A: Monitor patient Q 15 minutes for safety, encourage staff/peer interaction and administer medications as ordered by MD.  R: Pt participated in group session and was compliant with medications. Pt denies SI or plans to harm himself at this time.

## 2011-11-12 NOTE — Progress Notes (Signed)
[facility] Group Notes:  (Counselor/Nursing/MHT/Case Management/Adjunct) Counseling Group 11/12/2011 1:15 to 2:30 PM    Type of Therapy:  Group Therapy  Participation Level:  Did Not Attend  Jimmy [name] 11/12/2011, 5:01 PM

## 2011-11-12 NOTE — Progress Notes (Signed)
Patient ID: Jimmy [name], male   DOB: 1955-11-09, 56 y.o.   MRN: [id number] He has been discharged. He voiced that he understood discharge instruction. He had refused follow-up care. He denied thoughts of SI. All belonging taken home with him.

## 2011-11-14 NOTE — Progress Notes (Signed)
Pt. Refused to sign consent for follow-up so nothing was faxed anywhere.  Minta Balsam [place], 11/14/2011, 11:45AM

## 2011-11-15 ENCOUNTER — Emergency Department (HOSPITAL_COMMUNITY)
Admission: EM | Admit: 2011-11-15 | Discharge: 2011-11-16 | Disposition: A | Discharge status: Home / Self Care | Payer: Self-pay | Attending: Emergency Medicine | Admitting: Emergency Medicine

## 2011-11-15 ENCOUNTER — Encounter (HOSPITAL_COMMUNITY): Payer: Self-pay | Admitting: Emergency Medicine

## 2011-11-15 DIAGNOSIS — F329 Major depressive disorder, single episode, unspecified: Secondary | ICD-10-CM | POA: Insufficient documentation

## 2011-11-15 DIAGNOSIS — IMO0002 Reserved for concepts with insufficient information to code with codable children: Secondary | ICD-10-CM | POA: Insufficient documentation

## 2011-11-15 DIAGNOSIS — F1010 Alcohol abuse, uncomplicated: Secondary | ICD-10-CM | POA: Insufficient documentation

## 2011-11-15 DIAGNOSIS — F3289 Other specified depressive episodes: Secondary | ICD-10-CM | POA: Insufficient documentation

## 2011-11-15 DIAGNOSIS — F17200 Nicotine dependence, unspecified, uncomplicated: Secondary | ICD-10-CM | POA: Insufficient documentation

## 2011-11-15 LAB — COMPREHENSIVE METABOLIC PANEL
ALT: 13 U/L (ref 0–53)
Calcium: 8.9 mg/dL (ref 8.4–10.5)
Creatinine, Ser: 0.6 mg/dL (ref 0.50–1.35)
GFR calc Af Amer: >90 mL/min (ref >90)
GFR calc non Af Amer: >90 mL/min (ref >90)
Glucose, Bld: 95 mg/dL (ref 70–99)
Sodium: 141 mEq/L (ref 135–145)
Total Protein: 7.4 g/dL (ref 6.0–8.3)

## 2011-11-15 LAB — CBC
Hemoglobin: 11.9 g/dL — ABNORMAL LOW (ref 13.0–17.0)
MCH: 32.5 pg (ref 26.0–34.0)
MCHC: 35.2 g/dL (ref 30.0–36.0)
MCV: 92.3 fL (ref 78.0–100.0)

## 2011-11-15 LAB — ETHANOL: Alcohol, Ethyl (B): 207 mg/dL — ABNORMAL HIGH (ref 0–11)

## 2011-11-15 MED ORDER — ZOLPIDEM TARTRATE 5 MG PO TABS: 5.0000 mg | ORAL_TABLET | Freq: Every evening | ORAL | Status: DC | PRN

## 2011-11-15 MED ORDER — NICOTINE 21 MG/24HR TD PT24
21.0000 mg | MEDICATED_PATCH | Freq: Every day | TRANSDERMAL | Status: DC
  Administered 2011-11-16: 21 mg via TRANSDERMAL
  Filled 2011-11-15: qty 1

## 2011-11-15 MED ORDER — IBUPROFEN 200 MG PO TABS: 600.0000 mg | ORAL_TABLET | Freq: Three times a day (TID) | ORAL | Status: DC | PRN

## 2011-11-15 MED ORDER — ACETAMINOPHEN 325 MG PO TABS: 650.0000 mg | ORAL_TABLET | ORAL | Status: DC | PRN

## 2011-11-15 MED ORDER — ALUM & MAG HYDROXIDE-SIMETH 200-200-20 MG/5ML PO SUSP: 30.0000 mL | ORAL | Status: DC | PRN

## 2011-11-15 MED ORDER — ONDANSETRON HCL 4 MG PO TABS: 4.0000 mg | ORAL_TABLET | Freq: Three times a day (TID) | ORAL | Status: DC | PRN
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
## 2011-11-15 NOTE — ED Notes (Signed)
Pt alert, arrives from [place], presents via [employer], "wanting to hurt himself", denies plan, resp even unlabored, skin pwd

## 2011-11-16 ENCOUNTER — Encounter (HOSPITAL_COMMUNITY): Payer: Self-pay | Admitting: *Deleted

## 2011-11-16 LAB — RAPID URINE DRUG SCREEN, HOSP PERFORMED
Barbiturates: NOT DETECTED
Benzodiazepines: POSITIVE — AB
Cocaine: NOT DETECTED
Opiates: NOT DETECTED

## 2011-11-16 NOTE — ED Notes (Signed)
Pt desires to be discharged oppose to receiving detox services. Pt does not endorse suicidal or homicidal thoughts. Pt has signed No Harm Contract. Pt does not desire community resources at this time. MD notified by CSW and is agreeable with discharge to home.   Janann Colonel., MSW, [facility] Clinical Social Worker [phone]
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
## 2011-11-16 NOTE — ED Notes (Signed)
Bed:WA30<BR> Expected date:<BR> Expected time:<BR> Means of arrival:<BR> Comments:<BR> closed

## 2011-11-16 NOTE — ED Notes (Signed)
Patient is resting comfortably. 

## 2011-11-16 NOTE — ED Provider Notes (Addendum)
Pt resting in bed, sleep - easily aroused.  Stable VS.  No acute events reported.  Pt intoxicated on arrival.  Second ER presentation in < 1wk with a similar report.  Working towards a placement for treatment of EtOH abuse and chronic schizophrenia.  Tobin Chad, MD 11/16/11 0741   1210.  Evaluated pt at the bedside.  He is alert and oriented.  He denies SI or HI.  He states that he knows that he has a problem with alcohol but is not amenable to placement in a treatment program at this time.  Plan provide info regarding outpt resources and discharge home.  He is homeless but states that this has been his condition for quite some time.  Tobin Chad, MD 11/16/11 1229

## 2011-11-19 NOTE — ED Provider Notes (Signed)
History     CSN: [id number]  Arrival date & time 07/25/11  2054   First MD Initiated Contact with Patient 07/25/11 2127      Chief Complaint  Patient presents with  . Suicidal    (Consider location/radiation/quality/duration/timing/severity/associated sxs/prior treatment) Patient is a 56 y.o. male presenting with mental health disorder. The history is provided by the patient.  Mental Health Problem The primary symptoms include dysphoric mood. The current episode started this week.  The degree of incapacity that he is experiencing as a consequence of his illness is moderate. Additional symptoms of the illness include anhedonia, feelings of worthlessness and poor judgment. He admits to suicidal ideas. He does not have a plan to commit suicide. He contemplates harming himself. Risk factors that are present for mental illness include a history of mental illness.    Past Medical History  Diagnosis Date  . Alcohol abuse   . Schizophrenia   . Bipolar 1 disorder   . Mental disorder     Past Surgical History  Procedure Date  . Skin graft     No family history on file.  History  Substance Use Topics  . Smoking status: Current Everyday Smoker -- 1.5 packs/day for 30 years    Types: Cigarettes  . Smokeless tobacco: Not on file  . Alcohol Use: 12.6 oz/week    21 Cans of beer per week     drinks as much etoh as he can get per day      Review of Systems  Constitutional: Negative for fever and chills.  HENT: Negative.   Respiratory: Negative.   Cardiovascular: Negative.   Gastrointestinal: Negative.   Musculoskeletal: Negative.   Skin: Negative.   Neurological: Negative.   Psychiatric/Behavioral: Positive for suicidal ideas and dysphoric mood.    Allergies  Review of patient's allergies indicates no known allergies.  Home Medications  No current outpatient prescriptions on file.  BP 127/77  Pulse 75  Temp 98.1 F (36.7 C) (Oral)  Resp 18  SpO2 97%  Physical  Exam  Constitutional: He is oriented to person, place, and time. He appears well-developed and well-nourished.  HENT:  Head: Normocephalic.  Neck: Normal range of motion. Neck supple.  Cardiovascular: Normal rate and regular rhythm.   Pulmonary/Chest: Effort normal and breath sounds normal.  Abdominal: Soft. Bowel sounds are normal. There is no tenderness. There is no rebound and no guarding.  Musculoskeletal: Normal range of motion.  Neurological: He is alert and oriented to person, place, and time.  Skin: Skin is warm and dry. No rash noted.  Psychiatric: He has a normal mood and affect.    ED Course  Procedures (including critical care time)  Labs Reviewed  ETHANOL - Abnormal; Notable for the following:    Alcohol, Ethyl (B) 205 (*)     All other components within normal limits  CBC - Abnormal; Notable for the following:    RBC 3.82 (*)     Hemoglobin 12.4 (*)     HCT 35.8 (*)     All other components within normal limits  DIFFERENTIAL - Abnormal; Notable for the following:    Neutrophils Relative 39 (*)     Eosinophils Relative 13 (*)     Eosinophils Absolute 1.1 (*)     All other components within normal limits  URINE RAPID DRUG SCREEN (HOSP PERFORMED)  BASIC METABOLIC PANEL  LAB REPORT - SCANNED   No results found.   1. Bipolar disorder  MDM  Patient to be medically cleared and assessed by [facility]. Sitter assigned. Patient stable.        Rodena Medin, PA-C 11/19/11 1422

## 2011-11-19 NOTE — ED Provider Notes (Signed)
Medical screening examination/treatment/procedure(s) were performed by non-physician practitioner and as supervising physician I was immediately available for consultation/collaboration.   Gerhard Munch, MD 11/19/11 [phone]

## 2011-11-20 NOTE — ED Provider Notes (Signed)
Medical screening examination/treatment/procedure(s) were conducted as a shared visit with non-physician practitioner(s) and myself.  I personally evaluated the patient during the encounter.  Long-standing mental health problems.  No suicidal or homicidal ideations in ED.  Patient observed for approximately 2 hours  Donnetta Hutching, MD 11/20/11 1755

## 2011-12-01 NOTE — Discharge Summary (Signed)
Physician Discharge Summary Note  Patient:  Jimmy [name] is an 56 y.o., male MRN:  [id number] DOB:  1955-08-07 Patient phone:  [phone] (home)  Patient address:   [facility] [address] [place] [address]   Date of Admission:  11/11/2011 Date of Discharge: 11/12/11  Reason for Admission: see H&P.  Discharge Diagnoses: Alcohol Use Disorder; Cannabis Use Disorder, mild. Malingering, for Tx upon admission.   Level of Care:  OP  Hospital Course:  Pt admitted for crisis stabilization, detox and treatment. Pt attended all therapeutic groups, was active in his treatment planning process and agreed the current medication regimen for further stability during recovery.  There were no acute issues during treatment and he was open to further substance abuse Tx.  All labs were reviewed in great detail and medical/psychiatric needs were addressed.  No acute safety issues were noted on the unit. Medications were reviewed with pt and medication education was provided. Mental health treatment, medication management and continued sobriety will mitigate against any potential increased risk of harm to self and/or others.  Discussed the importance of recovery with pt, as well as, tools to move forward in a healthy & safe manner using the 12 Step Process.    Consults: none.  Significant Diagnostic Studies:  See labs.  Discharge Vitals:   Blood pressure 111/79, pulse 96, temperature 97.2 F (36.2 C), temperature source Oral, resp. rate 16, height 5' 5.5" (1.664 m), weight 66.225 kg (146 lb).  Physical Findings: AIMS: Facial and Oral Movements Muscles of Facial Expression: None, normal Lips and Perioral Area: None, normal Jaw: None, normal Tongue: None, normal,Extremity Movements Upper (arms, wrists, hands, fingers): None, normal Lower (legs, knees, ankles, toes): None, normal, Trunk Movements Neck, shoulders, hips: None, normal, Overall Severity Severity of abnormal movements (highest score  from questions above): None, normal Incapacitation due to abnormal movements: None, normal Patient's awareness of abnormal movements (rate only patient's report): No Awareness, Dental Status Current problems with teeth and/or dentures?: No Does patient usually wear dentures?: No  CIWA:  CIWA-Ar Total: 0   Mental Status Exam: See Mental Status Examination and Suicide Risk Assessment completed by Attending Physician prior to discharge.  Discharge destination:  Home  Is patient on multiple antipsychotic therapies at discharge:  No   Has Patient had three or more failed trials of antipsychotic monotherapy by history:  No  Recommended Plan for Multiple Antipsychotic Therapies: NA   Discharge Orders    Future Orders Please Complete By Expires   Diet - low sodium heart healthy        Medication List  As of 12/01/2011  9:38 AM   STOP taking these medications         risperiDONE 2 MG tablet           Follow-up Information    Follow up with Pt refused follow up appts. (Pt refused follow up appts)          Plan Of Care/Follow-up recommendations: Pt seen and evaluated in treatment team. Chart reviewed. Pt stable for and requesting discharge. Pt contracting for safety and does not currently meet [facility] involuntary commitment criteria for continued hospitalization against his will. Mental health treatment, medication management and continued sobriety will mitigate against the increased risk of harm to self and/or others. Discussed the importance of recovery further with pt, as well as, tools to move forward in a healthy & safe manner. Pt agreeable with the plan. Discussed with the team. Recommend follow up with  AA/NA. Diet: Regular. Activity: As tolerated.   Signed: Lupe Carney 12/01/2011, 9:38 AM

## 2012-02-19 ENCOUNTER — Encounter (HOSPITAL_COMMUNITY): Payer: Self-pay | Admitting: *Deleted

## 2012-02-19 ENCOUNTER — Emergency Department (HOSPITAL_COMMUNITY)
Admission: EM | Admit: 2012-02-19 | Discharge: 2012-02-19 | Disposition: A | Discharge status: Home / Self Care | Payer: Self-pay | Attending: Emergency Medicine | Admitting: Emergency Medicine

## 2012-02-19 ENCOUNTER — Emergency Department (HOSPITAL_COMMUNITY): Payer: Self-pay

## 2012-02-19 DIAGNOSIS — F17200 Nicotine dependence, unspecified, uncomplicated: Secondary | ICD-10-CM | POA: Insufficient documentation

## 2012-02-19 DIAGNOSIS — F209 Schizophrenia, unspecified: Secondary | ICD-10-CM | POA: Insufficient documentation

## 2012-02-19 DIAGNOSIS — F10929 Alcohol use, unspecified with intoxication, unspecified: Secondary | ICD-10-CM

## 2012-02-19 DIAGNOSIS — F10229 Alcohol dependence with intoxication, unspecified: Secondary | ICD-10-CM | POA: Insufficient documentation

## 2012-02-19 DIAGNOSIS — F319 Bipolar disorder, unspecified: Secondary | ICD-10-CM | POA: Insufficient documentation

## 2012-02-19 DIAGNOSIS — F1010 Alcohol abuse, uncomplicated: Secondary | ICD-10-CM | POA: Insufficient documentation

## 2012-02-19 LAB — TROPONIN I: Troponin I: <0.3 ng/mL (ref <0.30)

## 2012-02-19 LAB — URINALYSIS, ROUTINE W REFLEX MICROSCOPIC
Bilirubin Urine: NEGATIVE
Glucose, UA: NEGATIVE mg/dL
Ketones, ur: NEGATIVE mg/dL
Leukocytes, UA: NEGATIVE
Protein, ur: NEGATIVE mg/dL

## 2012-02-19 LAB — COMPREHENSIVE METABOLIC PANEL
AST: 22 U/L (ref 0–37)
BUN: 8 mg/dL (ref 6–23)
CO2: 27 mEq/L (ref 19–32)
Chloride: 98 mEq/L (ref 96–112)
Creatinine, Ser: 0.62 mg/dL (ref 0.50–1.35)
GFR calc non Af Amer: >90 mL/min (ref >90)
Total Bilirubin: 0.2 mg/dL — ABNORMAL LOW (ref 0.3–1.2)

## 2012-02-19 LAB — CBC WITH DIFFERENTIAL/PLATELET
HCT: 37.2 % — ABNORMAL LOW (ref 39.0–52.0)
Hemoglobin: 13.1 g/dL (ref 13.0–17.0)
Lymphocytes Relative: 38 % (ref 12–46)
Monocytes Absolute: 0.4 10*3/uL (ref 0.1–1.0)
Monocytes Relative: 5 % (ref 3–12)
Neutro Abs: 4 10*3/uL (ref 1.7–7.7)
WBC: 8.8 10*3/uL (ref 4.0–10.5)

## 2012-02-19 LAB — ETHANOL: Alcohol, Ethyl (B): 142 mg/dL — ABNORMAL HIGH (ref 0–11)

## 2012-02-19 LAB — RAPID URINE DRUG SCREEN, HOSP PERFORMED
Amphetamines: NOT DETECTED
Barbiturates: NOT DETECTED
Tetrahydrocannabinol: NOT DETECTED

## 2012-02-19 MED ORDER — GI COCKTAIL ~~LOC~~
30.0000 mL | Freq: Once | ORAL | Status: AC
  Administered 2012-02-19: 30 mL via ORAL
  Filled 2012-02-19: qty 30

## 2012-02-19 NOTE — ED Provider Notes (Signed)
History     CSN: [id number]  Arrival date & time 02/19/12  8119   First MD Initiated Contact with Patient 02/19/12 2009      Chief Complaint  Patient presents with  . Abdominal Pain    (Consider location/radiation/quality/duration/timing/severity/associated sxs/prior treatment) HPI Comments: Patient arrives by EMS with complaint of abdominal pain has been constant for several years. He states his pain was so bad he couldn't sleep last night. He does admit to being homeless and living warm place to stay. Reports no change in chronic nature of his pain. He is a chronic alcoholic last drink was this morning. He denies any cocaine or marijuana abuse. He has a history of bipolar and schizophrenia. He states he is not hearing any voices at this time he denies any suicidal or homicidal ideations. The pain radiates from his upper abdomen and into his chest. There's been no change in his bowel habits. He thought this pain was related to reflux and has taken Zantac in the past and used baking soda when he can't afford it.  The history is provided by the patient and the EMS personnel.    Past Medical History  Diagnosis Date  . Alcohol abuse   . Schizophrenia   . Bipolar 1 disorder   . Mental disorder     Past Surgical History  Procedure Date  . Skin graft     No family history on file.  History  Substance Use Topics  . Smoking status: Current Every Day Smoker -- 1.5 packs/day for 30 years    Types: Cigarettes  . Smokeless tobacco: Not on file  . Alcohol Use: 12.6 oz/week    21 Cans of beer per week     Comment: drinks as much etoh as he can get per day      Review of Systems  Constitutional: Negative for fever, activity change and appetite change.  HENT: Negative for congestion and rhinorrhea.   Eyes: Negative for visual disturbance.  Gastrointestinal: Positive for abdominal pain. Negative for nausea, vomiting and diarrhea.  Genitourinary: Negative for dysuria.    Musculoskeletal: Negative for back pain.  Neurological: Negative for dizziness, weakness and headaches.    Allergies  Review of patient's allergies indicates no known allergies.  Home Medications  No current outpatient prescriptions on file.  BP 116/67  Pulse 105  Temp 97.8 F (36.6 C) (Oral)  Resp 19  SpO2 95%  Physical Exam  Constitutional: He is oriented to person, place, and time. He appears well-developed and well-nourished. No distress.       Eating on arrival to room.  HENT:  Head: Normocephalic and atraumatic.  Mouth/Throat: Oropharynx is clear and moist. No oropharyngeal exudate.  Eyes: Conjunctivae normal and EOM are normal. Pupils are equal, round, and reactive to light.  Neck: Normal range of motion. Neck supple.  Cardiovascular: Normal rate, regular rhythm and normal heart sounds.   Pulmonary/Chest: Effort normal and breath sounds normal. No respiratory distress.  Abdominal: Soft. There is no tenderness. There is no rebound and no guarding.  Musculoskeletal: Normal range of motion. He exhibits no edema and no tenderness.  Neurological: He is alert and oriented to person, place, and time. No cranial nerve deficit. He exhibits normal muscle tone. Coordination normal.       No asterixis  Skin: Skin is warm.    ED Course  Procedures (including critical care time)  Labs Reviewed  CBC WITH DIFFERENTIAL - Abnormal; Notable for the following:    RBC  4.07 (*)     HCT 37.2 (*)     Eosinophils Relative 12 (*)     Eosinophils Absolute 1.0 (*)     All other components within normal limits  COMPREHENSIVE METABOLIC PANEL - Abnormal; Notable for the following:    Potassium 3.3 (*)     Total Bilirubin 0.2 (*)     All other components within normal limits  ETHANOL - Abnormal; Notable for the following:    Alcohol, Ethyl (B) 142 (*)     All other components within normal limits  LIPASE, BLOOD  URINALYSIS, ROUTINE W REFLEX MICROSCOPIC  URINE RAPID DRUG SCREEN (HOSP  PERFORMED)  TROPONIN I   Dg Abd Acute W/chest  02/19/2012  *RADIOLOGY REPORT*  Clinical Data: Abdominal pain and chest pain.  ACUTE ABDOMEN SERIES (ABDOMEN 2 VIEW & CHEST 1 VIEW)  Comparison: Chest x-ray 08/13/2011.  Findings: Lung volumes are normal.  No consolidative airspace disease.  No pleural effusions.  No pneumothorax.  No pulmonary nodule or mass noted.  Pulmonary vasculature and the cardiomediastinal silhouette are within normal limits.  Gas and stool are seen scattered throughout the colon extending to the level of the distal rectum.  No pathologic distension of small bowel is noted.  No gross evidence of pneumoperitoneum.  Numerous pelvic phleboliths are noted.  IMPRESSION: 1.  Nonobstructive bowel gas pattern. 2.  No pneumoperitoneum. 3.  No radiographic evidence of acute cardiopulmonary disease.   Original Report Authenticated By: Trudie Reed, M.D.      1. Alcohol intoxication       MDM  Chronic abdominal pain without acute change. Vital stable, no distress, abdomen soft and nonsurgical.  Labs unremarkable, EKG nonischemic. Patient sleeping comfortably after GI cocktail with a soft nontender abdomen.  He states he is feeling better. Denies suicidal or homicidal ideation.  Patient asks if he can stay tonight. I stated there is no medical reason for him to stay. He does not desire to stop drinking. He is medically and psychiatrically cleared for discharge.   Date: 02/19/2012  Rate: 98  Rhythm: normal sinus rhythm  QRS Axis: normal  Intervals: PR prolonged  ST/T Wave abnormalities: normal  Conduction Disutrbances:first-degree A-V block   Narrative Interpretation:   Old EKG Reviewed: unchanged        Glynn Octave, MD 02/19/12 2308

## 2012-02-19 NOTE — ED Notes (Signed)
Pt reports last drink today. Admits to chronic alcohol abuse. Denies wanting treatment. Pt asked multiple times if he is having any thoughts of hurting himself or anyone else. Pt denied everytime

## 2012-02-19 NOTE — ED Notes (Signed)
Patient with 20 year history of abdominal pain.  Patient brought to ED by [facility] for pain all over and abdominal pain.  Patient states that pain has gotten so bad that he cannot sleep

## 2012-03-17 ENCOUNTER — Emergency Department (HOSPITAL_COMMUNITY)
Admission: EM | Admit: 2012-03-17 | Discharge: 2012-03-18 | Disposition: A | Discharge status: Home / Self Care | Payer: Self-pay | Attending: Emergency Medicine | Admitting: Emergency Medicine

## 2012-03-17 ENCOUNTER — Encounter (HOSPITAL_COMMUNITY): Payer: Self-pay | Admitting: Emergency Medicine

## 2012-03-17 DIAGNOSIS — F1020 Alcohol dependence, uncomplicated: Secondary | ICD-10-CM | POA: Insufficient documentation

## 2012-03-17 DIAGNOSIS — R443 Hallucinations, unspecified: Secondary | ICD-10-CM | POA: Insufficient documentation

## 2012-03-17 DIAGNOSIS — F319 Bipolar disorder, unspecified: Secondary | ICD-10-CM | POA: Insufficient documentation

## 2012-03-17 DIAGNOSIS — F17200 Nicotine dependence, unspecified, uncomplicated: Secondary | ICD-10-CM | POA: Insufficient documentation

## 2012-03-17 DIAGNOSIS — F209 Schizophrenia, unspecified: Secondary | ICD-10-CM | POA: Insufficient documentation

## 2012-03-17 DIAGNOSIS — R440 Auditory hallucinations: Secondary | ICD-10-CM

## 2012-03-17 DIAGNOSIS — F10929 Alcohol use, unspecified with intoxication, unspecified: Secondary | ICD-10-CM

## 2012-03-17 MED ORDER — ZIPRASIDONE MESYLATE 20 MG IM SOLR
10.0000 mg | Freq: Once | INTRAMUSCULAR | Status: AC
  Administered 2012-03-18: 10 mg via INTRAMUSCULAR
  Filled 2012-03-17: qty 20

## 2012-03-17 NOTE — ED Notes (Signed)
Report given via EMS. EMS called out for chest pain, but pt reported no pain on arrival. ETOH on board, denied street drugs. Pt stated, "I am thinking about hurting myself." All vitals stable. Pt did not have a plan. Pt stated, "I have been having suicidal thoughts since 1985." Initial VS 122/80 Pulse 70 RR 16 Pain 0 at 2254. AAOx4. Ambulatory on scene.

## 2012-03-17 NOTE — ED Notes (Signed)
[id number]<JY> Expected date:<BR> Expected time:<BR> Means of arrival:<BR> Comments:<BR> EMS/SI/intoxicated

## 2012-03-18 LAB — URINALYSIS, ROUTINE W REFLEX MICROSCOPIC
Glucose, UA: NEGATIVE mg/dL
Hgb urine dipstick: NEGATIVE
Leukocytes, UA: NEGATIVE
Protein, ur: NEGATIVE mg/dL
Specific Gravity, Urine: 1.008 (ref 1.005–1.030)
pH: 5 (ref 5.0–8.0)

## 2012-03-18 LAB — COMPREHENSIVE METABOLIC PANEL
ALT: 27 U/L (ref 0–53)
Alkaline Phosphatase: 89 U/L (ref 39–117)
BUN: 5 mg/dL — ABNORMAL LOW (ref 6–23)
Chloride: 99 mEq/L (ref 96–112)
GFR calc Af Amer: >90 mL/min (ref >90)
Glucose, Bld: 84 mg/dL (ref 70–99)
Potassium: 3.8 mEq/L (ref 3.5–5.1)
Sodium: 136 mEq/L (ref 135–145)
Total Bilirubin: 0.2 mg/dL — ABNORMAL LOW (ref 0.3–1.2)
Total Protein: 8.2 g/dL (ref 6.0–8.3)

## 2012-03-18 LAB — CBC
HCT: 39.1 % (ref 39.0–52.0)
Hemoglobin: 13.6 g/dL (ref 13.0–17.0)
RBC: 4.11 MIL/uL — ABNORMAL LOW (ref 4.22–5.81)
WBC: 8.1 10*3/uL (ref 4.0–10.5)

## 2012-03-18 LAB — RAPID URINE DRUG SCREEN, HOSP PERFORMED
Barbiturates: NOT DETECTED
Benzodiazepines: NOT DETECTED
Cocaine: NOT DETECTED
Tetrahydrocannabinol: NOT DETECTED

## 2012-03-18 LAB — ACETAMINOPHEN LEVEL: Acetaminophen (Tylenol), Serum: <15 ug/mL (ref 10–30)

## 2012-03-18 LAB — ETHANOL
Alcohol, Ethyl (B): 164 mg/dL — ABNORMAL HIGH (ref 0–11)
Alcohol, Ethyl (B): 374 mg/dL — ABNORMAL HIGH (ref 0–11)

## 2012-03-18 MED ORDER — NICOTINE 21 MG/24HR TD PT24
21.0000 mg | MEDICATED_PATCH | Freq: Every day | TRANSDERMAL | Status: DC
  Administered 2012-03-18: 21 mg via TRANSDERMAL
  Filled 2012-03-18: qty 1

## 2012-03-18 MED ORDER — ZOLPIDEM TARTRATE 10 MG PO TABS: 10.0000 mg | ORAL_TABLET | Freq: Every evening | ORAL | Status: DC | PRN

## 2012-03-18 MED ORDER — LORAZEPAM 1 MG PO TABS: 1.0000 mg | ORAL_TABLET | Freq: Three times a day (TID) | ORAL | Status: DC | PRN

## 2012-03-18 MED ORDER — ACETAMINOPHEN 325 MG PO TABS: 650.0000 mg | ORAL_TABLET | ORAL | Status: DC | PRN

## 2012-03-18 MED ORDER — ALUM & MAG HYDROXIDE-SIMETH 200-200-20 MG/5ML PO SUSP: 30.0000 mL | ORAL | Status: DC | PRN

## 2012-03-18 MED ORDER — IBUPROFEN 600 MG PO TABS: 600.0000 mg | ORAL_TABLET | Freq: Three times a day (TID) | ORAL | Status: DC | PRN

## 2012-03-18 MED ORDER — RISPERIDONE 2 MG PO TABS: 2.0000 mg | ORAL_TABLET | Freq: Every day | ORAL | Status: DC

## 2012-03-18 MED ORDER — ONDANSETRON HCL 4 MG PO TABS: 4.0000 mg | ORAL_TABLET | Freq: Three times a day (TID) | ORAL | Status: DC | PRN

## 2012-03-18 NOTE — ED Provider Notes (Addendum)
7:23 AM  Pt seen and assessed. No complaints. Evaluated by psychiatry who recommend DC with outpt FU. Pt homeless. Will have social work eval prior to DC. Risperdal per recommendations.   Raeford Razor, MD 03/18/12 [phone]  Patient evaluated by social work. Patient does have a place to go. He has been medically and psychiatrically cleared at this time. Will be discharged with a prescription for Risperdal per psychiatric recommendations.  Raeford Razor, MD 03/18/12 1048

## 2012-03-18 NOTE — ED Notes (Signed)
Pt placed in blue scrubs; pt and belongings wanded by security and 2 bags placed under nurses station at room 4 in ED.

## 2012-03-18 NOTE — ED Provider Notes (Signed)
History     CSN: [id number]  Arrival date & time 03/17/12  2312   First MD Initiated Contact with Patient 03/17/12 2330      Chief Complaint  Patient presents with  . Suicidal   Level V caveat for psychiatric illness  (Consider location/radiation/quality/duration/timing/severity/associated sxs/prior treatment) HPI  Patient presents via EMS. Patient reports to me he is bipolar and he's been off medications since 2011. He states however he has been admitted to a psychiatric facility this year and states he is not taking any psychiatric medications. Shortly after the interview started he started getting very upset and starts yelling about wanting the voices to stop. Please had into the room at which point he lay down and he appeared to be talking to somebody who is not in the room. Patient was very hard to focus and get any further information from him. He is agreeable however to get a shot to make the voices go away.  Patient has had 7 ED visits in the past 6 months most are related to alcohol use abdominal pain and hallucinations.  Past Medical History  Diagnosis Date  . Alcohol abuse   . Schizophrenia   . Bipolar 1 disorder   . Mental disorder     Past Surgical History  Procedure Date  . Skin graft     No family history on file.  History  Substance Use Topics  . Smoking status: Current Every Day Smoker -- 1.5 packs/day for 30 years    Types: Cigarettes  . Smokeless tobacco: Not on file  . 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  Home Medications  No current outpatient prescriptions on file.  BP 113/66  Pulse 88  Temp 98 F (36.7  C) (Oral)  Resp 16  SpO2 97%  Vital signs normal    Physical Exam  Nursing note and vitals reviewed. Constitutional: He is oriented to person, place, and time. He appears well-developed and well-nourished.  Non-toxic appearance. He does not appear ill. No distress.       The patient at times is holding conversation with himself or somebody else in the room I cannot see. Patient becomes very easily agitated and starts yelling and screaming and acting threatening. Patient is very with unclean clothes  HENT:  Head: Normocephalic and atraumatic.  Right Ear: External ear normal.  Left Ear: External ear normal.  Nose: Nose normal. No mucosal edema or rhinorrhea.  Mouth/Throat: Oropharynx is clear and moist and mucous membranes are normal. No dental abscesses or uvula swelling.  Eyes: Conjunctivae normal and EOM are normal. Pupils are equal, round, and reactive to light.  Neck: Normal range of motion and full passive range of motion without pain. Neck supple.  Cardiovascular: Normal rate, regular rhythm and normal heart sounds.  Exam reveals no gallop and no friction rub.   No murmur heard. Pulmonary/Chest: Effort normal and breath sounds normal. No respiratory distress. He has no wheezes. He has no rhonchi. He has no rales. He exhibits no tenderness and no crepitus.  Abdominal: Soft. Normal appearance and bowel sounds are  normal. He exhibits no distension. There is tenderness. There is no rebound and no guarding.       Diffuse abdominal tenderness  Musculoskeletal: Normal range of motion. He exhibits no edema and no tenderness.       Moves all extremities well.   Neurological: He is alert and oriented to person, place, and time. He has normal strength. No cranial nerve deficit.  Skin: Skin is warm, dry and intact. No rash noted. No erythema. There is pallor.  Psychiatric: His speech is normal. His mood appears not anxious.       Patient appears to be having hallucinations that he is  verbally  responding to, very volatile and easily agitated    ED Course  Procedures (including critical care time)  Medications  ziprasidone (GEODON) injection 10 mg (10 mg Intramuscular Given 03/18/12 0013)   Patient had to be chemically restrained shortly after interview because of increased agitation displayed by the patient towards staff and his extreme distress about hearing voices.  05:00 Tammy Sours, ACT here in ED.  Results for orders placed during the hospital encounter of 03/17/12  URINALYSIS, ROUTINE W REFLEX MICROSCOPIC      Component Value Range   Color, Urine YELLOW  YELLOW   APPearance CLEAR  CLEAR   Specific Gravity, Urine 1.008  1.005 - 1.030   pH 5.0  5.0 - 8.0   Glucose, UA NEGATIVE  NEGATIVE mg/dL   Hgb urine dipstick NEGATIVE  NEGATIVE   Bilirubin Urine NEGATIVE  NEGATIVE   Ketones, ur NEGATIVE  NEGATIVE mg/dL   Protein, ur NEGATIVE  NEGATIVE mg/dL   Urobilinogen, UA 0.2  0.0 - 1.0 mg/dL   Nitrite NEGATIVE  NEGATIVE   Leukocytes, UA NEGATIVE  NEGATIVE  ACETAMINOPHEN LEVEL      Component Value Range   Acetaminophen (Tylenol), Serum <15.0  10 - 30 ug/mL  CBC      Component Value Range   WBC 8.1  4.0 - 10.5 K/uL   RBC 4.11 (*) 4.22 - 5.81 MIL/uL   Hemoglobin 13.6  13.0 - 17.0 g/dL   HCT 62.9  52.8 - 41.3 %   MCV 95.1  78.0 - 100.0 fL   MCH 33.1  26.0 - 34.0 pg   MCHC 34.8  30.0 - 36.0 g/dL   RDW 24.4  01.0 - 27.2 %   Platelets 397  150 - 400 K/uL  COMPREHENSIVE METABOLIC PANEL      Component Value Range   Sodium 136  135 - 145 mEq/L   Potassium 3.8  3.5 - 5.1 mEq/L   Chloride 99  96 - 112 mEq/L   CO2 25  19 - 32 mEq/L   Glucose, Bld 84  70 - 99 mg/dL   BUN 5 (*) 6 - 23 mg/dL   Creatinine, Ser 5.36  0.50 - 1.35 mg/dL   Calcium 9.0  8.4 - 64.4 mg/dL   Total Protein 8.2  6.0 - 8.3 g/dL   Albumin 3.5  3.5 - 5.2 g/dL   AST 49 (*) 0 - 37 U/L   ALT 27  0 - 53 U/L   Alkaline Phosphatase 89  39 - 117 U/L   Total Bilirubin 0.2 (*) 0.3 - 1.2 mg/dL   GFR calc non Af  Amer >90  >90 mL/min   GFR calc Af Amer >90  >90 mL/min  ETHANOL      Component Value Range   Alcohol, Ethyl (B) 374 (*) 0 - 11 mg/dL  SALICYLATE LEVEL  Component Value Range   Salicylate Lvl <2.0 (*) 2.8 - 20.0 mg/dL  URINE RAPID DRUG SCREEN (HOSP PERFORMED)      Component Value Range   Opiates NONE DETECTED  NONE DETECTED   Cocaine NONE DETECTED  NONE DETECTED   Benzodiazepines NONE DETECTED  NONE DETECTED   Amphetamines NONE DETECTED  NONE DETECTED   Tetrahydrocannabinol NONE DETECTED  NONE DETECTED   Barbiturates NONE DETECTED  NONE DETECTED   Laboratory interpretation all normal except alcohol intoxication   Dg Abd Acute W/chest  02/19/2012  *RADIOLOGY REPORT*  Clinical Data: Abdominal pain and chest pain.  ACUTE ABDOMEN SERIES (ABDOMEN 2 VIEW & CHEST 1 VIEW)  Comparison: Chest x-ray 08/13/2011.  Findings: Lung volumes are normal.  No consolidative airspace disease.  No pleural effusions.  No pneumothorax.  No pulmonary nodule or mass noted.  Pulmonary vasculature and the cardiomediastinal silhouette are within normal limits.  Gas and stool are seen scattered throughout the colon extending to the level of the distal rectum.  No pathologic distension of small bowel is noted.  No gross evidence of pneumoperitoneum.  Numerous pelvic phleboliths are noted.  IMPRESSION: 1.  Nonobstructive bowel gas pattern. 2.  No pneumoperitoneum. 3.  No radiographic evidence of acute cardiopulmonary disease.   Original Report Authenticated By: Trudie Reed, M.D.         Date: 03/18/2012  Rate: 83  Rhythm: normal sinus rhythm  QRS Axis: normal  Intervals: PR prolonged  ST/T Wave abnormalities: normal  Conduction Disutrbances:IVCD  Narrative Interpretation:   Old EKG Reviewed: unchanged from 02/06/2005    1. Alcohol intoxication   2. Alcoholism   3. Bipolar disorder   4. Auditory hallucination     Plan Evaluation by Telepsych and ACT  Devoria Albe, MD, FACEP   MDM            Ward Givens, MD 03/18/12 [phone]
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
## 2012-03-18 NOTE — Clinical Social Work Note (Signed)
CSW assessed pt at bedside.  Pt stated he "had somewhere to go".  Pt stated that he "stays off of [address]".  Pt requested bus pass from CSW.  CSW does not currently have bus passes.  CSW will see if department has any available.  EDP made aware pt is ready for d/c.  Vickii Penna, LCSWA [phone]  Clinical Social Work

## 2012-03-18 NOTE — ED Notes (Addendum)
Pt was ambulatory; confirmed with Lanora Manis, RN. Pt was wanded by security.

## 2012-03-31 ENCOUNTER — Encounter (HOSPITAL_COMMUNITY): Payer: Self-pay | Admitting: Emergency Medicine

## 2012-03-31 ENCOUNTER — Emergency Department (HOSPITAL_COMMUNITY)
Admission: EM | Admit: 2012-03-31 | Discharge: 2012-03-31 | Disposition: A | Discharge status: Home / Self Care | Payer: Federal, State, Local not specified - Other | Attending: Emergency Medicine | Admitting: Emergency Medicine

## 2012-03-31 DIAGNOSIS — F1020 Alcohol dependence, uncomplicated: Secondary | ICD-10-CM | POA: Insufficient documentation

## 2012-03-31 DIAGNOSIS — F319 Bipolar disorder, unspecified: Secondary | ICD-10-CM | POA: Insufficient documentation

## 2012-03-31 DIAGNOSIS — F17200 Nicotine dependence, unspecified, uncomplicated: Secondary | ICD-10-CM | POA: Insufficient documentation

## 2012-03-31 DIAGNOSIS — Z79899 Other long term (current) drug therapy: Secondary | ICD-10-CM | POA: Insufficient documentation

## 2012-03-31 DIAGNOSIS — F10929 Alcohol use, unspecified with intoxication, unspecified: Secondary | ICD-10-CM

## 2012-03-31 DIAGNOSIS — Z8659 Personal history of other mental and behavioral disorders: Secondary | ICD-10-CM | POA: Insufficient documentation

## 2012-03-31 LAB — CBC WITH DIFFERENTIAL/PLATELET
Basophils Relative: 1 % (ref 0–1)
Eosinophils Absolute: 0.5 10*3/uL (ref 0.0–0.7)
Hemoglobin: 13.4 g/dL (ref 13.0–17.0)
MCH: 32.8 pg (ref 26.0–34.0)
MCHC: 35 g/dL (ref 30.0–36.0)
Neutro Abs: 5.3 10*3/uL (ref 1.7–7.7)
Neutrophils Relative %: 56 % (ref 43–77)
Platelets: 338 10*3/uL (ref 150–400)
RBC: 4.09 MIL/uL — ABNORMAL LOW (ref 4.22–5.81)

## 2012-03-31 LAB — COMPREHENSIVE METABOLIC PANEL
ALT: 28 U/L (ref 0–53)
AST: 54 U/L — ABNORMAL HIGH (ref 0–37)
Albumin: 3.5 g/dL (ref 3.5–5.2)
Alkaline Phosphatase: 89 U/L (ref 39–117)
Chloride: 97 mEq/L (ref 96–112)
Potassium: 3.7 mEq/L (ref 3.5–5.1)
Sodium: 132 mEq/L — ABNORMAL LOW (ref 135–145)
Total Bilirubin: 0.3 mg/dL (ref 0.3–1.2)
Total Protein: 8.2 g/dL (ref 6.0–8.3)

## 2012-03-31 LAB — ETHANOL: Alcohol, Ethyl (B): 230 mg/dL — ABNORMAL HIGH (ref 0–11)

## 2012-03-31 MED ORDER — VITAMIN B-1 100 MG PO TABS
100.0000 mg | ORAL_TABLET | Freq: Every day | ORAL | Status: DC
  Administered 2012-03-31: 100 mg via ORAL
  Filled 2012-03-31: qty 1

## 2012-03-31 MED ORDER — LORAZEPAM 1 MG PO TABS: 1.0000 mg | ORAL_TABLET | Freq: Four times a day (QID) | ORAL | Status: DC | PRN

## 2012-03-31 MED ORDER — FOLIC ACID 1 MG PO TABS
1.0000 mg | ORAL_TABLET | Freq: Every day | ORAL | Status: DC
  Administered 2012-03-31: 1 mg via ORAL
  Filled 2012-03-31: qty 1

## 2012-03-31 MED ORDER — ALUM & MAG HYDROXIDE-SIMETH 200-200-20 MG/5ML PO SUSP: 30.0000 mL | ORAL | Status: DC | PRN

## 2012-03-31 MED ORDER — IBUPROFEN 600 MG PO TABS: 600.0000 mg | ORAL_TABLET | Freq: Three times a day (TID) | ORAL | Status: DC | PRN

## 2012-03-31 MED ORDER — NICOTINE 21 MG/24HR TD PT24
21.0000 mg | MEDICATED_PATCH | Freq: Every day | TRANSDERMAL | Status: DC
  Administered 2012-03-31: 21 mg via TRANSDERMAL
  Filled 2012-03-31: qty 1

## 2012-03-31 MED ORDER — THIAMINE HCL 100 MG/ML IJ SOLN: 100.0000 mg | Freq: Every day | INTRAMUSCULAR | Status: DC

## 2012-03-31 MED ORDER — ONDANSETRON HCL 4 MG PO TABS
4.0000 mg | ORAL_TABLET | Freq: Three times a day (TID) | ORAL | Status: DC | PRN
  Administered 2012-03-31: 4 mg via ORAL
  Filled 2012-03-31: qty 1

## 2012-03-31 MED ORDER — ADULT MULTIVITAMIN W/MINERALS CH
1.0000 | ORAL_TABLET | Freq: Every day | ORAL | Status: DC
  Administered 2012-03-31: 1 via ORAL
  Filled 2012-03-31: qty 1

## 2012-03-31 MED ORDER — ZOLPIDEM TARTRATE 5 MG PO TABS: 5.0000 mg | ORAL_TABLET | Freq: Every evening | ORAL | Status: DC | PRN

## 2012-03-31 MED ORDER — LORAZEPAM 1 MG PO TABS
0.0000 mg | ORAL_TABLET | Freq: Four times a day (QID) | ORAL | Status: DC
Start: 2012-03-31 — End: 2012-03-31
  Administered 2012-03-31: 1 mg via ORAL
  Filled 2012-03-31: qty 1

## 2012-03-31 MED ORDER — LORAZEPAM 1 MG PO TABS: 0.0000 mg | ORAL_TABLET | Freq: Two times a day (BID) | ORAL | Status: DC

## 2012-03-31 MED ORDER — LORAZEPAM 2 MG/ML IJ SOLN: 1.0000 mg | Freq: Four times a day (QID) | INTRAMUSCULAR | Status: DC | PRN

## 2012-03-31 NOTE — BHH Counselor (Addendum)
Pt is accepted to [facility], waiting for call back to obtain service authorization number to forward to [facility]. SW will provide pt with bus pass to train station, with expected arrival in [place], [place] approx 7p.  At [employer] Authorization [id number] to cover 1/8,9,10,11 (4 days) faxed to [facility] at [facility] along with pt assessment.  Ranae Pila, MS, LCAS

## 2012-03-31 NOTE — ED Provider Notes (Signed)
Pt has been accepted to [facility].  Plan discharge from ER to follow-up immediately at the [facility] facility.  Tobin Chad, MD 03/31/12 [phone]

## 2012-03-31 NOTE — Progress Notes (Signed)
CSW obtained [employer] for patient to [place]. CSW spoke with Andrey Campanile at [facility] who confirmed that transportation will be provided by [facility] from [employer] station to [facility]. CSW will provide patient with bus pass to take patient from hospital to train station, and will provide patient with train ticket information. CSW informed RN and EDP of patient discharge to [facility].   Pt train leaves at 649pm and arrives at 710 in [place].   Catha Gosselin, LCSWA  [phone] .03/31/2012 1621pm

## 2012-03-31 NOTE — ED Notes (Signed)
Pt was picked up by EMS at [facility] stating that he wanted alcohol detox and that he drinks "as much as he can get." Stated to nurse that he didn't want to die on the streets tonight. Denies SI/HI.

## 2012-03-31 NOTE — ED Notes (Signed)
Pt with severe body odor; pt instructed to take a shower and was provided new scrubs and toiletries. No acute distress noted at this time.

## 2012-03-31 NOTE — ED Notes (Signed)
Pt placed in blue scrubs; pt and belongings wanded by security.

## 2012-03-31 NOTE — BH Assessment (Signed)
Assessment Note   Jimmy [name] is a 57 y.o. male presenting to emerg dept for detox from alcohol and pills.  Pt denies SI/HI but says he's been hearing voices, pt is non specific on what voices are saying and there is no command to harm self or others---"tell me all sorts of things, you name it, they say it".  Pt says he's been hearing voices since 08/18/1983. Pt reports that he has been drinking since the age 63 and has had no previous detox treatment, however had 2 admissions in rehab in [place], [id number]) and [place]).  Pt tells this Clinical research associate that he drinks 2 cases of beer daily, last drink was 01/07/14and has recently started using pills approx 1 month ago, says he uses 6-7 pills daily, pt unable to verify pills. Pt says he obtains alcohol and pills from his brother's friends.  Pt says his wife died in 2009/08/17 and he lost his home and job after she died and started using heavily to cope.  Pt has been homeless since Aug 17, 2009.  Pt has legal charges: open container, trespassing and leaving a restaurant without paying.  Pt does not remember the his court date.  Pt says he has blackouts regularly and had a seizure in 2005-08-17.  Pt says he was dx with bipolar d/o, schizophrenia and depression in the past.      Axis I: Alcohol Dependence; Other (or Unknown) Substance Abuse  Axis II: Deferred Axis III:  Past Medical History  Diagnosis Date  . Alcohol abuse   . Schizophrenia   . Bipolar 1 disorder   . Mental disorder    Axis IV: economic problems, housing problems, other psychosocial or environmental problems, problems related to legal system/crime, problems related to social environment and problems with primary support group Axis V: 41-50 serious symptoms  Past Medical History:  Past Medical History  Diagnosis Date  . Alcohol abuse   . Schizophrenia   . Bipolar 1 disorder   . Mental disorder     Past Surgical History  Procedure Date  . Skin graft     Family History: No family history on  file.  Social History:  reports that he has been smoking Cigarettes.  He has a 45 pack-year smoking history. He does not have any smokeless tobacco history on file. He reports that he drinks about 12.6 ounces of alcohol per week. He reports that he uses illicit drugs.  Additional Social History:  Alcohol / Drug Use Pain Medications: None  Prescriptions: None  Over the Counter: None  History of alcohol / drug use?: Yes Longest period of sobriety (when/how long): Aug 18, 1986; 2006-08-18 Negative Consequences of Use: Legal;Personal relationships Substance #2 Name of Substance 2: Alcohol--Beer 2 - Age of First Use: 13 YOM  2 - Amount (size/oz): 2 Cases  2 - Frequency: Daily  2 - Duration: On-going  2 - Last Use / Amount: 03/30/12 Substance #3 Name of Substance 3: Pills  3 - Age of First Use: 56 YOM  3 - Amount (size/oz): 6-7 Pills  3 - Frequency: Daily  3 - Duration: On-going  3 - Last Use / Amount: 03/30/12  CIWA: CIWA-Ar BP: 140/80 mmHg Pulse Rate: 96  Nausea and Vomiting: no nausea and no vomiting Tactile Disturbances: none Tremor: not visible, but can be felt fingertip to fingertip Auditory Disturbances: not present Paroxysmal Sweats: no sweat visible Visual Disturbances: not present Anxiety: no anxiety, at ease Headache, Fullness in Head: none present Agitation: normal activity Orientation and  Clouding of Sensorium: oriented and can do serial additions CIWA-Ar Total: 1  COWS: Clinical Opiate Withdrawal Scale (COWS) Resting Pulse Rate: Pulse Rate 81-100 Sweating: No report of chills or flushing Restlessness: Able to sit still Pupil Size: Pupils pinned or normal size for room light Bone or Joint Aches: Not present Runny Nose or Tearing: Not present GI Upset: No GI symptoms Tremor: Tremor can be felt, but not observed Yawning: No yawning Anxiety or Irritability: None Gooseflesh Skin: Skin is smooth COWS Total Score: 2   Allergies: No Known Allergies  Home Medications:  (Not  in a hospital admission)  OB/GYN Status:  No LMP for male patient.  General Assessment Data Location of Assessment: [facility] Living Arrangements: Other (Comment) (Homeless) Can pt return to current living arrangement?: Yes Admission Status: Voluntary Is patient capable of signing voluntary admission?: Yes Transfer from: Acute Hospital Referral Source: MD  Education Status Is patient currently in school?: No Current Grade: None  Highest grade of school patient has completed: None  Name of school: None  Contact person: None   Risk to self Suicidal Ideation: No Suicidal Intent: No Is patient at risk for suicide?: No Suicidal Plan?: No Access to Means: No What has been your use of drugs/alcohol within the last 12 months?: Abusing: alcohol, pills  Previous Attempts/Gestures: No How many times?: 0  Other Self Harm Risks: None  Triggers for Past Attempts: None known Intentional Self Injurious Behavior: None Family Suicide History: No Recent stressful life event(s): Job Loss;Financial Problems;Other (Comment) (Homeless; Spouse died 08-28-2009) Persecutory voices/beliefs?: No Depression: Yes Depression Symptoms: Loss of interest in usual pleasures;Feeling worthless/self pity Substance abuse history and/or treatment for substance abuse?: Yes Suicide prevention information given to non-admitted patients: Not applicable  Risk to Others Homicidal Ideation: No Thoughts of Harm to Others: No Current Homicidal Intent: No Current Homicidal Plan: No Access to Homicidal Means: No Identified Victim: None  History of harm to others?: No Assessment of Violence: None Noted Violent Behavior Description: None  Does patient have access to weapons?: No Criminal Charges Pending?: Yes Describe Pending Criminal Charges: Open Container, Trespassing, Left restaurant w/o paying for food  Does patient have a court date: Yes (Unk ) Court Date:  (Unk )  Psychosis Hallucinations: None noted Delusions:  None noted  Mental Status Report Appear/Hygiene: Disheveled;Poor hygiene Eye Contact: Good Motor Activity: Unremarkable Speech: Logical/coherent Level of Consciousness: Alert Mood: Depressed Affect: Depressed Anxiety Level: None Thought Processes: Coherent;Relevant Judgement: Unimpaired Orientation: Person;Place;Time;Situation Obsessive Compulsive Thoughts/Behaviors: None  Cognitive Functioning Concentration: Normal Memory: Recent Intact;Remote Intact IQ: Average Insight: Good Impulse Control: Good Appetite: Good Weight Loss: 0  Weight Gain: 0  Sleep: No Change Total Hours of Sleep: 8  Vegetative Symptoms: None  ADLScreening [facility] Assessment Services) Patient's cognitive ability adequate to safely complete daily activities?: Yes Patient able to express need for assistance with ADLs?: Yes Independently performs ADLs?: Yes (appropriate for developmental age)  Abuse/Neglect [facility]) Physical Abuse: Denies Verbal Abuse: Denies Sexual Abuse: Denies  Prior Inpatient Therapy Prior Inpatient Therapy: Yes Prior Therapy Dates: 1988, 2008 Prior Therapy Facilty/Provider(s): [place], [place]; [place]   Reason for Treatment: Rehab--[facility] Day Program   Prior Outpatient Therapy Prior Outpatient Therapy: Yes Prior Therapy Dates: 08-28-05 Prior Therapy Facilty/Provider(s): [facility]bor[facility]  Reason for Treatment: Bipolar D/O   ADL Screening (condition at time of admission) Patient's cognitive ability adequate to safely complete daily activities?: Yes Patient able to express need for assistance with ADLs?: Yes Independently performs ADLs?: Yes (appropriate for  developmental age) Weakness of Legs: None Weakness of Arms/Hands: None  Home Assistive Devices/Equipment Home Assistive Devices/Equipment: None  Therapy Consults (therapy consults require a physician order) PT Evaluation Needed: No OT Evalulation Needed: No SLP Evaluation Needed: No Abuse/Neglect Assessment (Assessment to  be complete while patient is alone) Physical Abuse: Denies Verbal Abuse: Denies Sexual Abuse: Denies Exploitation of patient/patient's resources: Denies Self-Neglect: Denies Values / Beliefs Cultural Requests During Hospitalization: None Spiritual Requests During Hospitalization: None Consults Spiritual Care Consult Needed: No Social Work Consult Needed: No Merchant navy officer (For Healthcare) Advance Directive: Patient does not have advance directive;Patient would not like information Pre-existing out of facility DNR order (yellow form or pink MOST form): No Nutrition Screen- [facility] Patient's home diet: Regular Have you recently lost weight without trying?: No Have you been eating poorly because of a decreased appetite?: No Malnutrition Screening Tool Score: 0   Additional Information 1:1 In Past 12 Months?: No CIRT Risk: No Elopement Risk: No Does patient have medical clearance?: Yes     Disposition:  Disposition Disposition of Patient: Inpatient treatment program;Referred to ([facility]; [facility] ) Type of inpatient treatment program: Adult Patient referred to: Other (Comment);[facility] )  On Site Evaluation by:   Reviewed with Physician:     Murrell Redden 03/31/2012 5:02 AM

## 2012-03-31 NOTE — Discharge Instructions (Signed)
Alcohol Problems  Most adults who drink alcohol drink in moderation (not a lot) are at low risk for developing problems related to their drinking. However, all drinkers, including low-risk drinkers, should know about the health risks connected with drinking alcohol.  RECOMMENDATIONS FOR LOW-RISK DRINKING   Drink in moderation. Moderate drinking is defined as follows:    Men - no more than 2 drinks per day.   Nonpregnant women - no more than 1 drink per day.   Over age 65 - no more than 1 drink per day.  A standard drink is 12 grams of pure alcohol, which is equal to a 12 ounce bottle of beer or wine cooler, a 5 ounce glass of wine, or 1.5 ounces of distilled spirits (such as whiskey, brandy, vodka, or rum).   ABSTAIN FROM (DO NOT DRINK) ALCOHOL:   When pregnant or considering pregnancy.   When taking a medication that interacts with alcohol.   If you are alcohol dependent.   A medical condition that prohibits drinking alcohol (such as ulcer, liver disease, or heart disease).  DISCUSS WITH YOUR CAREGIVER:   If you are at risk for coronary heart disease, discuss the potential benefits and risks of alcohol use: Light to moderate drinking is associated with lower rates of coronary heart disease in certain populations (for example, men over age 45 and postmenopausal women). Infrequent or nondrinkers are advised not to begin light to moderate drinking to reduce the risk of coronary heart disease so as to avoid creating an alcohol-related problem. Similar protective effects can likely be gained through proper diet and exercise.   Women and the elderly have smaller amounts of body water than men. As a result women and the elderly achieve a higher blood alcohol concentration after drinking the same amount of alcohol.   Exposing a fetus to alcohol can cause a broad range of birth defects referred to as Fetal Alcohol Syndrome (FAS) or Alcohol-Related Birth Defects (ARBD). Although FAS/ARBD is connected with excessive  alcohol consumption during pregnancy, studies also have reported neurobehavioral problems in infants born to mothers reporting drinking an average of 1 drink per day during pregnancy.   Heavier drinking (the consumption of more than 4 drinks per occasion by men and more than 3 drinks per occasion by women) impairs learning (cognitive) and psychomotor functions and increases the risk of alcohol-related problems, including accidents and injuries.  CAGE QUESTIONS:    Have you ever felt that you should Cut down on your drinking?   Have people Annoyed you by criticizing your drinking?   Have you ever felt bad or Guilty about your drinking?   Have you ever had a drink first thing in the morning to steady your nerves or get rid of a hangover (Eye opener)?  If you answered positively to any of these questions: You may be at risk for alcohol-related problems if alcohol consumption is:    Men: Greater than 14 drinks per week or more than 4 drinks per occasion.   Women: Greater than 7 drinks per week or more than 3 drinks per occasion.  Do you or your family have a medical history of alcohol-related problems, such as:   Blackouts.   Sexual dysfunction.   Depression.   Trauma.   Liver dysfunction.   Sleep disorders.   Hypertension.   Chronic abdominal pain.   Has your drinking ever caused you problems, such as problems with your family, problems with your work (or school) performance, or accidents/injuries?     Do you have a compulsion to drink or a preoccupation with drinking?   Do you have poor control or are you unable to stop drinking once you have started?   Do you have to drink to avoid withdrawal symptoms?   Do you have problems with withdrawal such as tremors, nausea, sweats, or mood disturbances?   Does it take more alcohol than in the past to get you high?   Do you feel a strong urge to drink?   Do you change your plans so that you can have a drink?   Do you ever drink in the morning to relieve  the shakes or a hangover?  If you have answered a number of the previous questions positively, it may be time for you to talk to your caregivers, family, and friends and see if they think you have a problem. Alcoholism is a chemical dependency that keeps getting worse and will eventually destroy your health and relationships. Many alcoholics end up dead, impoverished, or in prison. This is often the end result of all chemical dependency.   Do not be discouraged if you are not ready to take action immediately.   Decisions to change behavior often involve up and down desires to change and feeling like you cannot decide.   Try to think more seriously about your drinking behavior.   Think of the reasons to quit.  WHERE TO GO FOR ADDITIONAL INFORMATION    The National Institute on Alcohol Abuse and Alcoholism (NIAAA)www.niaaa.nih.gov   National Council on Alcoholism and Drug Dependence (NCADD)www.ncadd.org   American Society of Addiction Medicine (ASAM)www.asam.org  Document Released: 03/10/2005 Document Revised: 06/02/2011 Document Reviewed: 10/27/2007  ExitCare Patient Information 2013 [employer].

## 2012-03-31 NOTE — ED Provider Notes (Signed)
History     CSN: [id number]  Arrival date & time 03/31/12  0108   First MD Initiated Contact with Patient 03/31/12 0149      Chief Complaint  Patient presents with  . Alcohol Detox    HPI  History provided by the patient. Patient is a 57 year old male with history of alcohol abuse and bipolar disorder who presents with requests for alcohol detox. Patient has long history of alcohol abuse with prior visits to emergency room. Patient states that he would like to have help with detox "preferably at [facility]". Patient states that he does not want to drink any longer. Patient has no other complaints except that it is too cold outside and he is not want to go back outside. He denies any recent illnesses or fever. Denies any vomiting or abdominal pain. He denies any depression, SI or HI.  Past Medical History  Diagnosis Date  . Alcohol abuse   . Schizophrenia   . Bipolar 1 disorder   . Mental disorder     Past Surgical History  Procedure Date  . Skin graft     No family history on file.  History  Substance Use Topics  . Smoking status: Current Every Day Smoker -- 1.5 packs/day for 30 years    Types: Cigarettes  . Smokeless tobacco: Not on file  . Alcohol Use: 12.6 oz/week    21 Cans of beer per week     Comment: drinks as much etoh as he can get per day      Review of Systems  All other systems reviewed and are negative.    Allergies  Review of patient's allergies indicates no known allergies.  Home Medications   Current Outpatient Rx  Name  Route  Sig  Dispense  Refill  . RISPERIDONE 2 MG PO TABS   Oral   Take 1 tablet (2 mg total) by mouth at bedtime.   30 tablet   0     BP 164/92  Pulse 84  Temp 98.6 F (37 C) (Oral)  Resp 16  SpO2 94%  Physical Exam  Nursing note and vitals reviewed. Constitutional: He is oriented to person, place, and time. He appears well-developed and well-nourished. No distress.  HENT:  Head: Normocephalic.  Cardiovascular:  Normal rate and regular rhythm.   No murmur heard. Pulmonary/Chest: Effort normal and breath sounds normal. No respiratory distress. He has no wheezes. He has no rales.  Abdominal: Soft.  Neurological: He is alert and oriented to person, place, and time.  Skin: Skin is warm.  Psychiatric: He has a normal mood and affect. His behavior is normal.    ED Course  Procedures   Results for orders placed during the hospital encounter of 03/31/12  CBC WITH DIFFERENTIAL      Component Value Range   WBC 9.5  4.0 - 10.5 K/uL   RBC 4.09 (*) 4.22 - 5.81 MIL/uL   Hemoglobin 13.4  13.0 - 17.0 g/dL   HCT 40.9 (*) 81.1 - 91.4 %   MCV 93.6  78.0 - 100.0 fL   MCH 32.8  26.0 - 34.0 pg   MCHC 35.0  30.0 - 36.0 g/dL   RDW 78.2  95.6 - 21.3 %   Platelets 338  150 - 400 K/uL   Neutrophils Relative 56  43 - 77 %   Neutro Abs 5.3  1.7 - 7.7 K/uL   Lymphocytes Relative 31  12 - 46 %   Lymphs Abs 2.9  0.7 - 4.0 K/uL   Monocytes Relative 7  3 - 12 %   Monocytes Absolute 0.7  0.1 - 1.0 K/uL   Eosinophils Relative 5  0 - 5 %   Eosinophils Absolute 0.5  0.0 - 0.7 K/uL   Basophils Relative 1  0 - 1 %   Basophils Absolute 0.1  0.0 - 0.1 K/uL  COMPREHENSIVE METABOLIC PANEL      Component Value Range   Sodium 132 (*) 135 - 145 mEq/L   Potassium 3.7  3.5 - 5.1 mEq/L   Chloride 97  96 - 112 mEq/L   CO2 23  19 - 32 mEq/L   Glucose, Bld 89  70 - 99 mg/dL   BUN 9  6 - 23 mg/dL   Creatinine, Ser 1.61  0.50 - 1.35 mg/dL   Calcium 9.2  8.4 - 09.6 mg/dL   Total Protein 8.2  6.0 - 8.3 g/dL   Albumin 3.5  3.5 - 5.2 g/dL   AST 54 (*) 0 - 37 U/L   ALT 28  0 - 53 U/L   Alkaline Phosphatase 89  39 - 117 U/L   Total Bilirubin 0.3  0.3 - 1.2 mg/dL   GFR calc non Af Amer >90  >90 mL/min   GFR calc Af Amer >90  >90 mL/min  ETHANOL      Component Value Range   Alcohol, Ethyl (B) 230 (*) 0 - 11 mg/dL  URINE RAPID DRUG SCREEN (HOSP PERFORMED)      Component Value Range   Opiates NONE DETECTED  NONE DETECTED   Cocaine  NONE DETECTED  NONE DETECTED   Benzodiazepines NONE DETECTED  NONE DETECTED   Amphetamines NONE DETECTED  NONE DETECTED   Tetrahydrocannabinol NONE DETECTED  NONE DETECTED   Barbiturates NONE DETECTED  NONE DETECTED         1. Alcohol dependence   2. Alcohol intoxication       MDM  2:30AM patient seen and evaluated. Patient is calm and cooperative. Patient denies any SI or HI.  Will move patient to psych ED. Spoke with Aurther Loft with [facility] act team. She will see patient and evaluate for placement for possible detox if patient continues to desire this.  Psych holding orders in place with alcohol detox protocol written.      Angus Seller, [place] 03/31/12 [phone]

## 2012-03-31 NOTE — ED Provider Notes (Signed)
Medical screening examination/treatment/procedure(s) were performed by non-physician practitioner and as supervising physician I was immediately available for consultation/collaboration.  Yarelly Kuba, MD 03/31/12 2305 

## 2012-03-31 NOTE — ED Provider Notes (Signed)
Jimmy [name] is a 57 y.o. male who presented intoxicated, with alcohol, and requesting alcohol detox. At this time. He is alert, lucid. He, states that he came in because it was "cold out side". He is not currently seeking any detoxification treatment. He is not tremulous. Vital signs are normal. He, states he would like help finding a place to live.  I asked the ED social worker to help him find a place to sleep, and community resources for alcoholism.  Flint Melter, MD 03/31/12 1013

## 2012-04-27 ENCOUNTER — Emergency Department (HOSPITAL_COMMUNITY)
Admission: EM | Admit: 2012-04-27 | Discharge: 2012-04-27 | Disposition: A | Discharge status: Home / Self Care | Payer: Self-pay | Attending: Emergency Medicine | Admitting: Emergency Medicine

## 2012-04-27 ENCOUNTER — Encounter (HOSPITAL_COMMUNITY): Payer: Self-pay | Admitting: *Deleted

## 2012-04-27 ENCOUNTER — Emergency Department (HOSPITAL_COMMUNITY): Payer: Self-pay

## 2012-04-27 DIAGNOSIS — F319 Bipolar disorder, unspecified: Secondary | ICD-10-CM | POA: Insufficient documentation

## 2012-04-27 DIAGNOSIS — Y939 Activity, unspecified: Secondary | ICD-10-CM | POA: Insufficient documentation

## 2012-04-27 DIAGNOSIS — S81009A Unspecified open wound, unspecified knee, initial encounter: Secondary | ICD-10-CM | POA: Insufficient documentation

## 2012-04-27 DIAGNOSIS — F259 Schizoaffective disorder, unspecified: Secondary | ICD-10-CM

## 2012-04-27 DIAGNOSIS — W269XXA Contact with unspecified sharp object(s), initial encounter: Secondary | ICD-10-CM | POA: Insufficient documentation

## 2012-04-27 DIAGNOSIS — S91009A Unspecified open wound, unspecified ankle, initial encounter: Secondary | ICD-10-CM | POA: Insufficient documentation

## 2012-04-27 DIAGNOSIS — Z79899 Other long term (current) drug therapy: Secondary | ICD-10-CM | POA: Insufficient documentation

## 2012-04-27 DIAGNOSIS — F209 Schizophrenia, unspecified: Secondary | ICD-10-CM | POA: Insufficient documentation

## 2012-04-27 DIAGNOSIS — F17200 Nicotine dependence, unspecified, uncomplicated: Secondary | ICD-10-CM | POA: Insufficient documentation

## 2012-04-27 DIAGNOSIS — S61509A Unspecified open wound of unspecified wrist, initial encounter: Secondary | ICD-10-CM | POA: Insufficient documentation

## 2012-04-27 DIAGNOSIS — F1020 Alcohol dependence, uncomplicated: Secondary | ICD-10-CM

## 2012-04-27 DIAGNOSIS — Y9229 Other specified public building as the place of occurrence of the external cause: Secondary | ICD-10-CM | POA: Insufficient documentation

## 2012-04-27 DIAGNOSIS — X789XXA Intentional self-harm by unspecified sharp object, initial encounter: Secondary | ICD-10-CM | POA: Insufficient documentation

## 2012-04-27 DIAGNOSIS — R451 Restlessness and agitation: Secondary | ICD-10-CM

## 2012-04-27 LAB — COMPREHENSIVE METABOLIC PANEL
Albumin: 3.6 g/dL (ref 3.5–5.2)
Alkaline Phosphatase: 75 U/L (ref 39–117)
BUN: 8 mg/dL (ref 6–23)
CO2: 27 mEq/L (ref 19–32)
Chloride: 98 mEq/L (ref 96–112)
GFR calc Af Amer: >90 mL/min (ref >90)
Glucose, Bld: 83 mg/dL (ref 70–99)
Potassium: 3.4 mEq/L — ABNORMAL LOW (ref 3.5–5.1)
Total Bilirubin: 0.1 mg/dL — ABNORMAL LOW (ref 0.3–1.2)

## 2012-04-27 LAB — ETHANOL: Alcohol, Ethyl (B): 241 mg/dL — ABNORMAL HIGH (ref 0–11)

## 2012-04-27 LAB — CBC
HCT: 36.7 % — ABNORMAL LOW (ref 39.0–52.0)
Hemoglobin: 12.8 g/dL — ABNORMAL LOW (ref 13.0–17.0)
RBC: 3.88 MIL/uL — ABNORMAL LOW (ref 4.22–5.81)
WBC: 10.7 10*3/uL — ABNORMAL HIGH (ref 4.0–10.5)

## 2012-04-27 LAB — ACETAMINOPHEN LEVEL: Acetaminophen (Tylenol), Serum: <15 ug/mL (ref 10–30)

## 2012-04-27 LAB — RAPID URINE DRUG SCREEN, HOSP PERFORMED
Barbiturates: NOT DETECTED
Benzodiazepines: NOT DETECTED

## 2012-04-27 MED ORDER — LORAZEPAM 2 MG/ML IJ SOLN: 2.0000 mg | INTRAMUSCULAR | Status: DC | PRN

## 2012-04-27 MED ORDER — LORAZEPAM 2 MG/ML IJ SOLN
2.0000 mg | Freq: Once | INTRAMUSCULAR | Status: AC
  Administered 2012-04-27: 2 mg via INTRAMUSCULAR
  Filled 2012-04-27: qty 1

## 2012-04-27 MED ORDER — ZIPRASIDONE MESYLATE 20 MG IM SOLR
10.0000 mg | Freq: Once | INTRAMUSCULAR | Status: AC
  Administered 2012-04-27: 09:00:00 via INTRAMUSCULAR
  Filled 2012-04-27: qty 20

## 2012-04-27 MED ORDER — ASENAPINE MALEATE 5 MG SL SUBL
10.0000 mg | SUBLINGUAL_TABLET | Freq: Every day | SUBLINGUAL | Status: DC
  Filled 2012-04-27: qty 2

## 2012-04-27 NOTE — ED Notes (Signed)
Patient transported to X-ray 

## 2012-04-27 NOTE — ED Notes (Signed)
Took pt a cup of coke, cheese graham crackers and peanut butter

## 2012-04-27 NOTE — Clinical Social Work Note (Signed)
CSW assessed pt at bedside.  Complete assessment to follow.  Pt denies SI/HI.  Pt reports no AVH.  CSW will complete [facility] assessment. Vickii Penna, LCSWA [phone]  Clinical Social Work

## 2012-04-27 NOTE — ED Provider Notes (Addendum)
TREASURE INGRUM is a 57 y.o. male who is being evaluated for altered mental status alcohol intoxication, and suicide ideation. He has been seen by Telepsych to treat who has recommend starting Saphris, each bedtime, and Ativan every 4-6 hours when necessary agitation. The patient has become agitated, and difficult to control. Geodon IM and Ativan were ordered. The psychiatrist, has recommended inpatient psychiatric treatment.    Flint Melter, MD 04/27/12 0901     Date: 01/09/2012  Rate: 101hythm: sinus tachycardia  QRS Axis: normal  PR and QT Intervals: normal  ST/T Wave abnormalities: normal  PR and QRS Conduction Disutrbances:none  Narrative Interpretation:   Old EKG Reviewed: change noted, rate faster   Flint Melter, MD 04/27/12 1057
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
## 2012-04-27 NOTE — ED Notes (Signed)
4 bags pt belongings placed in locker 28

## 2012-04-27 NOTE — [facility] Suicide Risk Assessment (Signed)
Suicide Risk Assessment  Discharge Assessment     Demographic Factors:  Male, Adolescent or young adult, Caucasian, Low socioeconomic status and Living alone  Mental Status Per Nursing Assessment::   On Admission:     Current Mental Status by Physician: Patient has been sober from alcohol intoxication at this time and he has denied symptoms of depression, anxiety, and psychosis. He has no suicidal, homicidal thoughts, intentions and plans, and has no evidence of psychosis.  Loss Factors: Financial problems/change in socioeconomic status  Historical Factors: Impulsivity  Risk Reduction Factors:   Sense of responsibility to family and Religious beliefs about death  Continued Clinical Symptoms:  Alcohol/Substance Abuse/Dependencies  Cognitive Features That Contribute To Risk:  Loss of executive function Polarized thinking    Suicide Risk:  Minimal: No identifiable suicidal ideation.  Patients presenting with no risk factors but with morbid ruminations; may be classified as minimal risk based on the severity of the depressive symptoms  Discharge Diagnoses:   AXIS I:  Substance Induced Mood Disorder and Alcohol dependence AXIS II:  Deferred AXIS III:   Past Medical History  Diagnosis Date  . Alcohol abuse   . Schizophrenia   . Bipolar 1 disorder   . Mental disorder    AXIS IV:  economic problems, housing problems, occupational problems, problems related to legal system/crime, problems related to social environment and problems with access to health care services AXIS V:  41-50 serious symptoms  Plan Of Care/Follow-up recommendations:  Activity:  As tolerated Diet:  Regular  Is patient on multiple antipsychotic therapies at discharge:  No   Has Patient had three or more failed trials of antipsychotic monotherapy by history:  No  Recommended Plan for Multiple Antipsychotic Therapies: Not applicable  Ottie Tillery,JANARDHAHA R. 04/27/2012, 3:12 PM

## 2012-04-27 NOTE — ED Notes (Signed)
Psychiatrist at bedside

## 2012-04-27 NOTE — BH Assessment (Signed)
Assessment Note   Jimmy [name] is a 57 y.o. homeless male who presents to the ED with a BAL of over 200 after being transported by EMS from [facility] where the pt made superficial cuts to his chest.  Upon arrival pt reports being SI with a plan of cutting himself with glass.  Upon this assessment pt denies SI/HI.  Pt reports hx of AH, but states he hears no voices when he is on his meds.  Pt denies AH, VH and delusions. CSW inquired if pt is seeking tx, pt responded, "no".  Pt was seen earlier this same month in the [facility] and was d/c to [facility] for detox from ETOH.  Pt was d/c from [facility].  Pt stated to this writer that he just needs somewhere to stay.    Pt states he was given medication from the detox facility ([facility]).  CSW asked pt where he would be refilling his Rx.  Pt did not know.  CSW suggested pt f/u with [facility] re: medication management and other possible outpatient resources.  CSW also inquired if pt was obtaining resources from the [facility] ([facility]).  Pt stated he had been there only for disability assistance.  Pt stated if d/c he would f/u with [facility] in hopes to find a more permanent means of shelter.  Pt denies SI and can contract for safety   Axis I: Depression, NOS; Alcohol Abuse Axis II: Deferred Axis III:  Past Medical History  Diagnosis Date  . Alcohol abuse   . Schizophrenia   . Bipolar 1 disorder   . Mental disorder    Axis IV: economic problems, housing problems, occupational problems, other psychosocial or environmental problems, problems related to legal system/crime, problems related to social environment, problems with access to health care services and problems with primary support group Axis V: 51-60 moderate symptoms  Past Medical History:  Past Medical History  Diagnosis Date  . Alcohol abuse   . Schizophrenia   . Bipolar 1 disorder   . Mental disorder     Past Surgical History  Procedure Date  . Skin graft     Family  History: No family history on file.  Social History:  reports that he has been smoking Cigarettes.  He has a 45 pack-year smoking history. He does not have any smokeless tobacco history on file. He reports that he drinks about 12.6 ounces of alcohol per week. He reports that he uses illicit drugs.  Additional Social History:     CIWA: CIWA-Ar BP: 145/64 mmHg Pulse Rate: 101  Nausea and Vomiting: no nausea and no vomiting Tactile Disturbances: very mild itching, pins and needles, burning or numbness Tremor: not visible, but can be felt fingertip to fingertip Auditory Disturbances: not present Paroxysmal Sweats: no sweat visible Visual Disturbances: not present Anxiety: two Headache, Fullness in Head: none present Agitation: somewhat more than normal activity Orientation and Clouding of Sensorium: cannot do serial additions or is uncertain about date CIWA-Ar Total: 6  COWS:    Allergies: No Known Allergies  Home Medications:  (Not in a hospital admission)  OB/GYN Status:  No LMP for male patient.  General Assessment Data Location of Assessment: [facility] ED ACT Assessment: Yes Living Arrangements: Other (Comment) (homeless) Can pt return to current living arrangement?: Yes Admission Status: Voluntary Is patient capable of signing voluntary admission?: Yes Transfer from: Other (Comment) Referral Source: Self/Family/Friend (brought to ED via EMS)  Education Status Is patient currently in school?: No  Current Grade: none Highest grade of school patient has completed: none Name of school: none Contact person: none  Risk to self Suicidal Ideation: No (pt currently denies SI/HI ) Suicidal Intent: No Is patient at risk for suicide?: Yes Suicidal Plan?: No Access to Means: No (no means reported) What has been your use of drugs/alcohol within the last 12 months?:  (Abusing ETOH) Previous Attempts/Gestures: No (pt has superficial cuts to chest) How many times?:  (0) Other Self Harm  Risks:  (pt has superficial cuts to chest made by self) Triggers for Past Attempts: None known Intentional Self Injurious Behavior: Cutting Comment - Self Injurious Behavior:  (pt has superficial cuts to chest. states wanted to hurt self) Family Suicide History: Unknown Recent stressful life event(s): Job Loss;Financial Problems;Other (Comment) (pt is currently homeless) Persecutory voices/beliefs?: No Depression: Yes Depression Symptoms: Fatigue;Loss of interest in usual pleasures;Feeling worthless/self pity Substance abuse history and/or treatment for substance abuse?: Yes Suicide prevention information given to non-admitted patients: Not applicable  Risk to Others Homicidal Ideation: No Thoughts of Harm to Others: No Current Homicidal Intent: No Current Homicidal Plan: No Access to Homicidal Means: No Identified Victim:  (none) History of harm to others?: No Assessment of Violence: None Noted Violent Behavior Description: none Does patient have access to weapons?: No Criminal Charges Pending?: Yes Describe Pending Criminal Charges:  (charged with holding sign without permit court date 2/6) Does patient have a court date: Yes (Thursday Feb 6) Court Date: 04/29/12  Psychosis Hallucinations: Auditory Delusions: None noted  Mental Status Report Appear/Hygiene: Disheveled Eye Contact: Good Motor Activity: Freedom of movement Speech: Logical/coherent Level of Consciousness: Alert;Quiet/awake Mood: Depressed;Worthless, low self-esteem Affect: Depressed Anxiety Level: Minimal Thought Processes: Coherent;Relevant Judgement: Other (Comment) (impaired upon arrival (ETOH) ) Orientation: Person;Place;Time;Situation;Appropriate for developmental age Obsessive Compulsive Thoughts/Behaviors: None  Cognitive Functioning Concentration: Normal Memory: Recent Intact;Remote Intact IQ: Average Insight: Good Impulse Control: Good Appetite: Good Weight Loss:  (none reported) Weight  Gain:  (none reported) Sleep: No Change Total Hours of Sleep: 6  Vegetative Symptoms: None  ADLScreening [facility] Assessment Services) Patient's cognitive ability adequate to safely complete daily activities?: Yes Patient able to express need for assistance with ADLs?: Yes Independently performs ADLs?: Yes (appropriate for developmental age)  Abuse/Neglect [facility]) Physical Abuse: Denies Verbal Abuse: Denies Sexual Abuse: Denies  Prior Inpatient Therapy Prior Inpatient Therapy: Yes Prior Therapy Dates:  (jan 2014) Prior Therapy Facilty/Provider(s):  ([facility]) Reason for Treatment:  (detox)  Prior Outpatient Therapy Prior Outpatient Therapy: Yes Prior Therapy Dates:  (pt could not recall dates) Prior Therapy Facilty/Provider(s):  (pt is stating he cannot remember- it has been a while) Reason for Treatment:  (medication management)  ADL Screening (condition at time of admission) Patient's cognitive ability adequate to safely complete daily activities?: Yes Patient able to express need for assistance with ADLs?: Yes Independently performs ADLs?: Yes (appropriate for developmental age)       Abuse/Neglect Assessment (Assessment to be complete while patient is alone) Physical Abuse: Denies Verbal Abuse: Denies Sexual Abuse: Denies Exploitation of patient/patient's resources: Denies Self-Neglect: Denies Values / Beliefs Cultural Requests During Hospitalization: None Spiritual Requests During Hospitalization: None Consults Spiritual Care Consult Needed: No Social Work Consult Needed: Yes (Comment) (homelessness)      Additional Information 1:1 In Past 12 Months?: No CIRT Risk: No Elopement Risk: No Does patient have medical clearance?: Yes  Child/Adolescent Assessment Running Away Risk:  (n/a) Bed-Wetting:  (n/a) Destruction of Property:  (n/a) Cruelty to Animals:  (n/a) Stealing:  (  n/a) Rebellious/Defies Authority:  (n/a) Satanic Involvement:  (n/a) Fire Setting:   (n/a) Problems at School:  (n/a) Gang Involvement:  (n/a)  Disposition:  Disposition Disposition of Patient: Other dispositions Type of inpatient treatment program: Adult Other disposition(s):  (Telepsych recommends inpatient tx) Patient referred to: Other (Comment) (pt will need resources for [facility] and [facility] upon d/c)  On Site Evaluation by:   Reviewed with Physician:     Rondel Baton 04/27/2012 1:45 PM
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
## 2012-04-30 ENCOUNTER — Encounter (HOSPITAL_COMMUNITY): Payer: Self-pay | Admitting: *Deleted

## 2012-04-30 ENCOUNTER — Emergency Department (HOSPITAL_COMMUNITY)
Admission: EM | Admit: 2012-04-30 | Discharge: 2012-04-30 | Disposition: A | Discharge status: Home / Self Care | Payer: Self-pay | Attending: Emergency Medicine | Admitting: Emergency Medicine

## 2012-04-30 DIAGNOSIS — Z5900 Homelessness unspecified: Secondary | ICD-10-CM | POA: Insufficient documentation

## 2012-04-30 DIAGNOSIS — F1010 Alcohol abuse, uncomplicated: Secondary | ICD-10-CM | POA: Insufficient documentation

## 2012-04-30 DIAGNOSIS — F17200 Nicotine dependence, unspecified, uncomplicated: Secondary | ICD-10-CM | POA: Insufficient documentation

## 2012-04-30 DIAGNOSIS — Z8659 Personal history of other mental and behavioral disorders: Secondary | ICD-10-CM | POA: Insufficient documentation

## 2012-04-30 NOTE — ED Notes (Signed)
Pt states he was cold and needed somewhere to stay; denies si/hi; denies hearing voices; Tiffany in with patient; pt requesting bus pass; calm and cooperative

## 2012-04-30 NOTE — ED Provider Notes (Signed)
Medical screening examination/treatment/procedure(s) were performed by non-physician practitioner and as supervising physician I was immediately available for consultation/collaboration.  John-Adam Denaja Verhoeven, M.D.     John-Adam Zoya Sprecher, MD 04/30/12 0716 
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
## 2012-09-16 ENCOUNTER — Emergency Department (HOSPITAL_COMMUNITY)
Admission: EM | Admit: 2012-09-16 | Discharge: 2012-09-17 | Disposition: A | Discharge status: Home / Self Care | Payer: Self-pay | Attending: Emergency Medicine | Admitting: Emergency Medicine

## 2012-09-16 ENCOUNTER — Encounter (HOSPITAL_COMMUNITY): Payer: Self-pay | Admitting: Emergency Medicine

## 2012-09-16 DIAGNOSIS — Z8659 Personal history of other mental and behavioral disorders: Secondary | ICD-10-CM | POA: Insufficient documentation

## 2012-09-16 DIAGNOSIS — F10229 Alcohol dependence with intoxication, unspecified: Secondary | ICD-10-CM | POA: Insufficient documentation

## 2012-09-16 DIAGNOSIS — F1410 Cocaine abuse, uncomplicated: Secondary | ICD-10-CM | POA: Insufficient documentation

## 2012-09-16 DIAGNOSIS — F10220 Alcohol dependence with intoxication, uncomplicated: Secondary | ICD-10-CM

## 2012-09-16 DIAGNOSIS — F17200 Nicotine dependence, unspecified, uncomplicated: Secondary | ICD-10-CM | POA: Insufficient documentation

## 2012-09-16 LAB — COMPREHENSIVE METABOLIC PANEL
ALT: 94 U/L — ABNORMAL HIGH (ref 0–53)
Albumin: 3.6 g/dL (ref 3.5–5.2)
Alkaline Phosphatase: 84 U/L (ref 39–117)
BUN: 5 mg/dL — ABNORMAL LOW (ref 6–23)
Calcium: 8.7 mg/dL (ref 8.4–10.5)
GFR calc Af Amer: >90 mL/min (ref >90)
Potassium: 3.7 mEq/L (ref 3.5–5.1)
Sodium: 131 mEq/L — ABNORMAL LOW (ref 135–145)
Total Protein: 8 g/dL (ref 6.0–8.3)

## 2012-09-16 LAB — RAPID URINE DRUG SCREEN, HOSP PERFORMED
Amphetamines: NOT DETECTED
Benzodiazepines: NOT DETECTED
Cocaine: NOT DETECTED

## 2012-09-16 LAB — ETHANOL: Alcohol, Ethyl (B): 301 mg/dL — ABNORMAL HIGH (ref 0–11)

## 2012-09-16 LAB — CBC
MCH: 32.7 pg (ref 26.0–34.0)
MCHC: 35.1 g/dL (ref 30.0–36.0)
RDW: 13 % (ref 11.5–15.5)

## 2012-09-16 NOTE — ED Notes (Signed)
Pt presenting to ed with c/o wanting detox pt states he last drank right before ems brought him into ED. Pt states he drank beer today he doesn't know how much. Pt denies SI/HI at this time

## 2012-09-16 NOTE — ED Notes (Addendum)
Pt educated on need to find a ride with alcohol level.  Stated he has no one.  Pt notified that he will be placed in hall bed until 0100 and then he will placed in lobby until buses run in the morning.  Pt verbalized understanding at this time

## 2012-09-16 NOTE — ED Provider Notes (Signed)
History    CSN: [id number] Arrival date & time 09/16/12  1702  First MD Initiated Contact with Patient 09/16/12 1730     Chief Complaint  Patient presents with  . detox   (Consider location/radiation/quality/duration/timing/severity/associated sxs/prior Treatment) The history is provided by the patient and the EMS personnel.  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  Pt notes hx etoh abuse. +homeless. Pt also notes hx depression. Pt denies thoughts of harm to self or others. Denies acute worsening of depression. Pt does indicate has been drinking heavily, but cant quantify amount. Last drank today. Also notes hx cocaine and thc abuse. Denies any recent change in medication/med use. States physical health at baseline. Denies any pain or injury. No nv. No headache. No neck or back pain. w etoh abuse, denies hx seizures or dts.    Past Medical History  Diagnosis Date  . Alcohol abuse   . Schizophrenia   . Bipolar 1 disorder   . Mental disorder    Past Surgical History  Procedure Laterality Date  . Skin graft     No family history on file. History  Substance Use Topics  . Smoking status: Current Every Day Smoker -- 1.50 packs/day for 30 years    Types: Cigarettes  . Smokeless tobacco: Not on file  . Alcohol Use: 12.6 oz/week    21 Cans of beer per week     Comment: drinks as much etoh as he can get per day    Review of Systems  Constitutional: Negative for fever.  HENT: Negative for neck pain.   Eyes: Negative for pain.  Respiratory: Negative for shortness of breath.   Cardiovascular: Negative for chest pain.  Gastrointestinal: Negative for vomiting, abdominal pain and diarrhea.  Genitourinary: Negative for flank pain.  Musculoskeletal: Negative for back pain.  Skin: Negative for wound.  Neurological: Negative for headaches.  Hematological: Does not bruise/bleed easily.  Psychiatric/Behavioral: Negative for  suicidal ideas.    Allergies  Review of patient's allergies indicates no known allergies.  Home Medications  No current outpatient prescriptions on file. BP 113/67  Pulse 95  Temp(Src) 98.5 F (36.9 C) (Oral)  Resp 15  SpO2 97% Physical Exam  Nursing note and vitals reviewed. Constitutional: He is oriented to person, place, and time. He appears well-developed and well-nourished. No distress.  HENT:  Head: Atraumatic.  Mouth/Throat: Oropharynx is clear and moist.  Eyes: Conjunctivae are normal. Pupils are equal, round, and reactive to light. No scleral icterus.  Neck: Neck supple. No tracheal deviation present.  Cardiovascular: Normal rate, regular rhythm, normal heart sounds and intact distal pulses.   Pulmonary/Chest: Effort normal and breath sounds normal. No accessory muscle usage. No respiratory distress. He exhibits no tenderness.  Abdominal: Soft. Bowel sounds are normal. He exhibits no distension. There is no tenderness.  Musculoskeletal: Normal range of motion. He exhibits no edema and no tenderness.  CTLS spine, non tender, aligned, no step off.   Neurological: He is alert and oriented to person, place, and time.  Motor intact bil. Steady gait.   Skin: Skin is warm and dry. He is not diaphoretic.  Psychiatric:  Sl slow to respond, ?intoxicated.     ED Course  Procedures (including critical care time)  Results for orders placed during the hospital encounter of 09/16/12  CBC      Result Value Range   WBC 7.1  4.0 - 10.5 K/uL   RBC  3.64 (*) 4.22 - 5.81 MIL/uL   Hemoglobin 11.9 (*) 13.0 - 17.0 g/dL   HCT 40.9 (*) 81.1 - 91.4 %   MCV 93.1  78.0 - 100.0 fL   MCH 32.7  26.0 - 34.0 pg   MCHC 35.1  30.0 - 36.0 g/dL   RDW 78.2  95.6 - 21.3 %   Platelets 248  150 - 400 K/uL  COMPREHENSIVE METABOLIC PANEL      Result Value Range   Sodium 131 (*) 135 - 145 mEq/L   Potassium 3.7  3.5 - 5.1 mEq/L   Chloride 96  96 - 112 mEq/L   CO2 20  19 - 32 mEq/L   Glucose, Bld 83   70 - 99 mg/dL   BUN 5 (*) 6 - 23 mg/dL   Creatinine, Ser 0.86  0.50 - 1.35 mg/dL   Calcium 8.7  8.4 - 57.8 mg/dL   Total Protein 8.0  6.0 - 8.3 g/dL   Albumin 3.6  3.5 - 5.2 g/dL   AST 469 (*) 0 - 37 U/L   ALT 94 (*) 0 - 53 U/L   Alkaline Phosphatase 84  39 - 117 U/L   Total Bilirubin 0.4  0.3 - 1.2 mg/dL   GFR calc non Af Amer >90  >90 mL/min   GFR calc Af Amer >90  >90 mL/min  ETHANOL      Result Value Range   Alcohol, Ethyl (B) 301 (*) 0 - 11 mg/dL  URINE RAPID DRUG SCREEN (HOSP PERFORMED)      Result Value Range   Opiates NONE DETECTED  NONE DETECTED   Cocaine NONE DETECTED  NONE DETECTED   Benzodiazepines NONE DETECTED  NONE DETECTED   Amphetamines NONE DETECTED  NONE DETECTED   Tetrahydrocannabinol NONE DETECTED  NONE DETECTED   Barbiturates NONE DETECTED  NONE DETECTED  ETHANOL      Result Value Range   Alcohol, Ethyl (B) 126 (*) 0 - 11 mg/dL     MDM  Labs sent from triage.  Reviewed nursing notes and prior charts for additional history.   Pt presented intoxicated.  On recheck, ambulatory in ed. No distress. No new c/o or pain.  Spine nt. abd soft nt.  No tremor or shakes.   Pt not actively seeking detox/rehab program, however provided him with outpatient referrals/resources for etoh/substance abuse.  Also encouraged pt to follow up closely with primary care doctor.     Suzi Roots, MD 09/18/12 [phone]

## 2012-09-17 LAB — ETHANOL: Alcohol, Ethyl (B): 126 mg/dL — ABNORMAL HIGH (ref 0–11)

## 2012-09-25 NOTE — ED Notes (Signed)
Pt BIB EMS. Pt requesting detox from alcohol per EMS. Pt also c/o bilateral foot pain from walking. Pt reports that he is homeless. Pt ambulatory from ambulance to exam room with steady gait.

## 2012-09-26 ENCOUNTER — Encounter (HOSPITAL_COMMUNITY): Payer: Self-pay | Admitting: *Deleted

## 2012-09-26 ENCOUNTER — Emergency Department (HOSPITAL_COMMUNITY)
Admission: EM | Admit: 2012-09-26 | Discharge: 2012-09-27 | Disposition: A | Discharge status: Home / Self Care | Payer: Federal, State, Local not specified - Other | Attending: Emergency Medicine | Admitting: Emergency Medicine

## 2012-09-26 DIAGNOSIS — F1010 Alcohol abuse, uncomplicated: Secondary | ICD-10-CM | POA: Insufficient documentation

## 2012-09-26 DIAGNOSIS — Z5900 Homelessness unspecified: Secondary | ICD-10-CM | POA: Insufficient documentation

## 2012-09-26 DIAGNOSIS — F1020 Alcohol dependence, uncomplicated: Secondary | ICD-10-CM

## 2012-09-26 DIAGNOSIS — F17200 Nicotine dependence, unspecified, uncomplicated: Secondary | ICD-10-CM | POA: Insufficient documentation

## 2012-09-26 DIAGNOSIS — Z8659 Personal history of other mental and behavioral disorders: Secondary | ICD-10-CM | POA: Insufficient documentation

## 2012-09-26 LAB — COMPREHENSIVE METABOLIC PANEL
AST: 79 U/L — ABNORMAL HIGH (ref 0–37)
Albumin: 3.7 g/dL (ref 3.5–5.2)
Alkaline Phosphatase: 83 U/L (ref 39–117)
BUN: 5 mg/dL — ABNORMAL LOW (ref 6–23)
Creatinine, Ser: 0.57 mg/dL (ref 0.50–1.35)
Potassium: 3.9 mEq/L (ref 3.5–5.1)
Total Protein: 8.3 g/dL (ref 6.0–8.3)

## 2012-09-26 LAB — CBC
HCT: 33.5 % — ABNORMAL LOW (ref 39.0–52.0)
MCHC: 35.5 g/dL (ref 30.0–36.0)
Platelets: 278 10*3/uL (ref 150–400)
RDW: 13.7 % (ref 11.5–15.5)

## 2012-09-26 LAB — ACETAMINOPHEN LEVEL: Acetaminophen (Tylenol), Serum: <15 ug/mL (ref 10–30)

## 2012-09-26 LAB — RAPID URINE DRUG SCREEN, HOSP PERFORMED
Benzodiazepines: NOT DETECTED
Cocaine: NOT DETECTED

## 2012-09-26 LAB — SALICYLATE LEVEL: Salicylate Lvl: <2 mg/dL — ABNORMAL LOW (ref 2.8–20.0)

## 2012-09-26 LAB — ETHANOL: Alcohol, Ethyl (B): 339 mg/dL — ABNORMAL HIGH (ref 0–11)

## 2012-09-26 MED ORDER — ADULT MULTIVITAMIN W/MINERALS CH
1.0000 | ORAL_TABLET | Freq: Every day | ORAL | Status: DC
  Administered 2012-09-27: 1 via ORAL
  Filled 2012-09-26 (×2): qty 1

## 2012-09-26 MED ORDER — LORAZEPAM 2 MG/ML IJ SOLN: 1.0000 mg | Freq: Four times a day (QID) | INTRAMUSCULAR | Status: DC | PRN

## 2012-09-26 MED ORDER — THIAMINE HCL 100 MG/ML IJ SOLN: 100.0000 mg | Freq: Every day | INTRAMUSCULAR | Status: DC

## 2012-09-26 MED ORDER — LORAZEPAM 1 MG PO TABS
1.0000 mg | ORAL_TABLET | Freq: Four times a day (QID) | ORAL | Status: DC | PRN
  Administered 2012-09-26 (×2): 1 mg via ORAL
  Filled 2012-09-26 (×2): qty 1

## 2012-09-26 MED ORDER — FOLIC ACID 1 MG PO TABS
1.0000 mg | ORAL_TABLET | Freq: Every day | ORAL | Status: DC
  Administered 2012-09-27: 1 mg via ORAL
  Filled 2012-09-26 (×2): qty 1

## 2012-09-26 MED ORDER — VITAMIN B-1 100 MG PO TABS
100.0000 mg | ORAL_TABLET | Freq: Every day | ORAL | Status: DC
  Administered 2012-09-27: 100 mg via ORAL
  Filled 2012-09-26 (×2): qty 1

## 2012-09-26 NOTE — ED Notes (Signed)
Pt changed into blue scrubs and red socks.  Pt and belongings wanded by security.

## 2012-09-26 NOTE — ED Notes (Signed)
Pt requesting detox from alcohol and cocaine - pt admits to last drink was 30 minutes ago - pt states he normally drinks approx x5 40oz beers daily. Pt states he has not recently had any SI/HI

## 2012-09-26 NOTE — Consult Note (Signed)
Reason for Consult:Alcohol dependence and intoxication Referring Physician: [name] is an 57 y.o. male.  HPI:  Male 57 years old is in [place] seeking detoxification treatment from alcohol.  Patient came in with alcohol level of 339 and states he drinks as much as he can lay his hands on.  Patient reports he was detoxe in our unit last year.  His motivation for wanting to stop drinking is " I want to get my acts together"  Patient is homeless and has a hx of Bipolar disorder.  Patient also states he has not taken his medications since last fall.  Patient started drinking since age 31 and could not remember any period of sobriety.  He reports withdrawal symptoms that includes seizure, anxiety and visual hallucination but denies  Any at this time.  He denies SI/HI/AVH.  We will admit him for alcohol detoxification when bed is available.  Meanwhile we have initiated our alcohol detox protocol with Ativan.  Past Medical History  Diagnosis Date  . Alcohol abuse   . Schizophrenia   . Bipolar 1 disorder   . Mental disorder     Past Surgical History  Procedure Laterality Date  . Skin graft      History reviewed. No pertinent family history.  Social History:  reports that he has been smoking Cigarettes.  He has a 60 pack-year smoking history. He does not have any smokeless tobacco history on file. He reports that he drinks about 12.6 ounces of alcohol per week. He reports that he uses illicit drugs (Cocaine and Marijuana).  Allergies: No Known Allergies  Medications: I have reviewed the patient's current medications.  Results for orders placed during the hospital encounter of 09/26/12 (from the past 48 hour(s))  URINE RAPID DRUG SCREEN (HOSP PERFORMED)     Status: None   Collection Time    09/26/12 12:26 AM      Result Value Range   Opiates NONE DETECTED  NONE DETECTED   Cocaine NONE DETECTED  NONE DETECTED   Benzodiazepines NONE DETECTED  NONE DETECTED   Amphetamines NONE  DETECTED  NONE DETECTED   Tetrahydrocannabinol NONE DETECTED  NONE DETECTED   Barbiturates NONE DETECTED  NONE DETECTED   Comment:            DRUG SCREEN FOR MEDICAL PURPOSES     ONLY.  IF CONFIRMATION IS NEEDED     FOR ANY PURPOSE, NOTIFY LAB     WITHIN 5 DAYS.                LOWEST DETECTABLE LIMITS     FOR URINE DRUG SCREEN     Drug Class       Cutoff (ng/mL)     Amphetamine      1000     Barbiturate      200     Benzodiazepine   200     Tricyclics       300     Opiates          300     Cocaine          300     THC              50  ACETAMINOPHEN LEVEL     Status: None   Collection Time    09/26/12 12:44 AM      Result Value Range   Acetaminophen (Tylenol), Serum <15.0  10 - 30 ug/mL   Comment:  THERAPEUTIC CONCENTRATIONS VARY     SIGNIFICANTLY. A RANGE OF 10-30     ug/mL MAY BE AN EFFECTIVE     CONCENTRATION FOR MANY PATIENTS.     HOWEVER, SOME ARE BEST TREATED     AT CONCENTRATIONS OUTSIDE THIS     RANGE.     ACETAMINOPHEN CONCENTRATIONS     >150 ug/mL AT 4 HOURS AFTER     INGESTION AND >50 ug/mL AT 12     HOURS AFTER INGESTION ARE     OFTEN ASSOCIATED WITH TOXIC     REACTIONS.  CBC     Status: Abnormal   Collection Time    09/26/12 12:44 AM      Result Value Range   WBC 7.3  4.0 - 10.5 K/uL   RBC 3.56 (*) 4.22 - 5.81 MIL/uL   Hemoglobin 11.9 (*) 13.0 - 17.0 g/dL   HCT 29.5 (*) 62.1 - 30.8 %   MCV 94.1  78.0 - 100.0 fL   MCH 33.4  26.0 - 34.0 pg   MCHC 35.5  30.0 - 36.0 g/dL   RDW 65.7  84.6 - 96.2 %   Platelets 278  150 - 400 K/uL  COMPREHENSIVE METABOLIC PANEL     Status: Abnormal   Collection Time    09/26/12 12:44 AM      Result Value Range   Sodium 130 (*) 135 - 145 mEq/L   Potassium 3.9  3.5 - 5.1 mEq/L   Chloride 94 (*) 96 - 112 mEq/L   CO2 23  19 - 32 mEq/L   Glucose, Bld 94  70 - 99 mg/dL   BUN 5 (*) 6 - 23 mg/dL   Creatinine, Ser 9.52  0.50 - 1.35 mg/dL   Calcium 8.8  8.4 - 84.1 mg/dL   Total Protein 8.3  6.0 - 8.3 g/dL   Albumin  3.7  3.5 - 5.2 g/dL   AST 79 (*) 0 - 37 U/L   ALT 50  0 - 53 U/L   Alkaline Phosphatase 83  39 - 117 U/L   Total Bilirubin 0.2 (*) 0.3 - 1.2 mg/dL   GFR calc non Af Amer >90  >90 mL/min   GFR calc Af Amer >90  >90 mL/min   Comment:            The eGFR has been calculated     using the CKD EPI equation.     This calculation has not been     validated in all clinical     situations.     eGFR's persistently     <90 mL/min signify     possible Chronic Kidney Disease.  ETHANOL     Status: Abnormal   Collection Time    09/26/12 12:44 AM      Result Value Range   Alcohol, Ethyl (B) 339 (*) 0 - 11 mg/dL   Comment:            LOWEST DETECTABLE LIMIT FOR     SERUM ALCOHOL IS 11 mg/dL     FOR MEDICAL PURPOSES ONLY  SALICYLATE LEVEL     Status: Abnormal   Collection Time    09/26/12 12:44 AM      Result Value Range   Salicylate Lvl <2.0 (*) 2.8 - 20.0 mg/dL    No results found.  Review of Systems  Constitutional: Negative.   HENT: Negative.   Eyes: Negative.   Respiratory: Negative.   Cardiovascular: Negative.   Gastrointestinal: Negative.  Genitourinary: Negative.   Musculoskeletal: Negative.   Skin: Negative.   Endo/Heme/Allergies: Negative.   Psychiatric/Behavioral: Positive for depression (RATES HIS DEPRESSION 6/20,HX OF BIPOLAR D/O) and substance abuse (Alcohol dependence). Negative for suicidal ideas, hallucinations and memory loss. The patient is not nervous/anxious and does not have insomnia.    Blood pressure 126/74, pulse 81, temperature 98.5 F (36.9 C), temperature source Oral, resp. rate 17, SpO2 95.00%. Physical Exam  Constitutional: He is oriented to person, place, and time. He appears well-developed and well-nourished. No distress.  HENT:  Head: Normocephalic and atraumatic.  Eyes: Conjunctivae and EOM are normal. Right eye exhibits no discharge. Left eye exhibits no discharge. No scleral icterus.  Neck: Normal range of motion. Neck supple. No JVD present.  No tracheal deviation present. No thyromegaly present.  Cardiovascular: Normal rate, regular rhythm, normal heart sounds and intact distal pulses.   Respiratory: Effort normal. No stridor. No respiratory distress. He has no wheezes. He has rales (mild cracles upper lobes bilaterally). He exhibits no tenderness.  GI: Soft. Bowel sounds are normal. He exhibits no distension and no mass. There is no tenderness. There is no rebound and no guarding.  Musculoskeletal: Normal range of motion. He exhibits no edema and no tenderness.  Lymphadenopathy:    He has no cervical adenopathy.  Neurological: He is alert and oriented to person, place, and time. He has normal reflexes.  Skin: Skin is warm and dry. No rash noted. He is not diaphoretic. No erythema. No pallor.    Assessment/Plan:  We will admit to our chemical dependency unit for alcohol detoxification. Goal will be to assist him obtain and maintain sobriety.   Dahlia Byes, C   PMHNP-BC 09/26/2012, 4:08 PM

## 2012-09-26 NOTE — BH Assessment (Signed)
Assessment Note   Jimmy [name] is an 57 y.o. male. Pt presents voluntarily to [facility] with request for detox for alcohol and cocaine. BAL upon arrival was 339. UDS negative. He was inpatient at [facility] 10/22/11 & 11/11/11 for alcohol dependence. He also went to treatment at [facility] and [facility]. PT drinks approx. five 40-oz beers daily. He denies SI and HI. He endorses AH which he says he has been experiencing off and on since 1985. No command hallucinations. No delusions noted. Pt had a seizure in 2007 after he drank two beers, so he isn't sure if it was the beer or some other reason.  Pt currently homeless. He says he is supposed to be going to [facility] for monthly injections but "I can't hang onto money long enough to buy a bus ticket to get down there". He reports past dx of bipolar d/o, schizophrenia, and depression. UDS was negative. He reports depressed mood. Affect is depressed. Pt states he has been to [facility] before but he doesn't want to go back if they don't have cigarettes.   Axis I: Alcohol Dependence           Schizophrenia Axis II: Deferred Axis III:  Past Medical History  Diagnosis Date  . Alcohol abuse   . Schizophrenia   . Bipolar 1 disorder   . Mental disorder    Axis IV: economic problems, housing problems, other psychosocial or environmental problems, problems related to social environment and problems with primary support group Axis V: 31-40 impairment in reality testing  Past Medical History:  Past Medical History  Diagnosis Date  . Alcohol abuse   . Schizophrenia   . Bipolar 1 disorder   . Mental disorder     Past Surgical History  Procedure Laterality Date  . Skin graft      Family History: History reviewed. No pertinent family history.  Social History:  reports that he has been smoking Cigarettes.  He has a 60 pack-year smoking history. He does not have any smokeless tobacco history on file. He reports that he drinks about 12.6 ounces  of alcohol per week. He reports that he uses illicit drugs (Cocaine and Marijuana).  Additional Social History:  Alcohol / Drug Use Pain Medications: none Prescriptions: none Over the Counter: none History of alcohol / drug use?: Yes Negative Consequences of Use: Financial;Legal;Personal relationships;Work / Mining engineer #1 Name of Substance 1: alcohol 1 - Age of First Use: 13 1 - Amount (size/oz): five 40-oz beers 1 - Frequency: daily 1 - Duration: years 1 - Last Use / Amount: 09/25/12 - pt unsure how much he drank  CIWA: CIWA-Ar BP: 119/79 mmHg Pulse Rate: 96 COWS:    Allergies: No Known Allergies  Home Medications:  (Not in a hospital admission)  OB/GYN Status:  No LMP for male patient.  General Assessment Data Location of Assessment: [facility] Living Arrangements: Other (Comment) (homeless living in woods) Can pt return to current living arrangement?: Yes Admission Status: Voluntary Is patient capable of signing voluntary admission?: Yes Transfer from: Acute Hospital Referral Source: Self/Family/Friend  Education Status Is patient currently in school?: No Current Grade: na Highest grade of school patient has completed: 8  Risk to self Suicidal Ideation: No Suicidal Intent: No Is patient at risk for suicide?: No Suicidal Plan?: No Access to Means: No What has been your use of drugs/alcohol within the last 12 months?: daily alcohol use Previous Attempts/Gestures: No How many times?: 0 Other  Self Harm Risks: none Triggers for Past Attempts:  (n/a) Intentional Self Injurious Behavior: None Family Suicide History: No Recent stressful life event(s): Other (Comment);Financial Problems (homelessness) Persecutory voices/beliefs?: No Depression: No Substance abuse history and/or treatment for substance abuse?: Yes Suicide prevention information given to non-admitted patients: Not applicable  Risk to Others Homicidal Ideation: No Thoughts of Harm to Others:  No Current Homicidal Intent: No Current Homicidal Plan: No Access to Homicidal Means: No Identified Victim: none History of harm to others?: No Assessment of Violence: None Noted Violent Behavior Description: pt calm during assessment Does patient have access to weapons?: No Criminal Charges Pending?: Yes Describe Pending Criminal Charges: begging Does patient have a court date: Yes Court Date: 10/18/12  Psychosis Hallucinations: Auditory Delusions: None noted  Mental Status Report Appear/Hygiene: Disheveled;Poor hygiene Eye Contact: Fair Motor Activity: Freedom of movement Speech: Logical/coherent Level of Consciousness: Quiet/awake Mood: Depressed;Sad Affect: Apprehensive;Sad;Depressed Anxiety Level: None Thought Processes: Coherent;Relevant Judgement: Unimpaired Orientation: Person;Time;Situation;Place Obsessive Compulsive Thoughts/Behaviors: None  Cognitive Functioning Concentration: Normal Memory: Recent Impaired;Remote Impaired IQ: Average Insight: Fair Impulse Control: Poor Appetite: Fair Weight Loss: 0 Weight Gain: 0 Sleep: No Change Total Hours of Sleep: 3 Vegetative Symptoms: None  ADLScreening [facility] Assessment Services) Patient's cognitive ability adequate to safely complete daily activities?: Yes Patient able to express need for assistance with ADLs?: Yes Independently performs ADLs?: Yes (appropriate for developmental age)  Abuse/Neglect [facility]) Physical Abuse: Denies Verbal Abuse: Denies Sexual Abuse: Denies  Prior Inpatient Therapy Prior Inpatient Therapy: Yes Prior Therapy Dates: 2013 / 2008 //  1988 Prior Therapy Facilty/Provider(s): [facility] // [facility] // [facility] Reason for Treatment: alcohol dependence  Prior Outpatient Therapy Prior Outpatient Therapy: Yes Prior Therapy Dates: 2007 Prior Therapy Facilty/Provider(s): [facility] Reason for Treatment: bipolar disorder   ADL Screening (condition at time of  admission) Patient's cognitive ability adequate to safely complete daily activities?: Yes Patient able to express need for assistance with ADLs?: Yes Independently performs ADLs?: Yes (appropriate for developmental age)       Abuse/Neglect Assessment (Assessment to be complete while patient is alone) Physical Abuse: Denies Verbal Abuse: Denies Sexual Abuse: Denies Exploitation of patient/patient's resources: Denies Self-Neglect: Denies Values / Beliefs Cultural Requests During Hospitalization: None Spiritual Requests During Hospitalization: None   Advance Directives (For Healthcare) Advance Directive: Patient does not have advance directive          Disposition:  Disposition Initial Assessment Completed for this Encounter: Yes Disposition of Patient: Inpatient treatment program;Outpatient treatment Type of inpatient treatment program: Adult  On Site Evaluation by:   Reviewed with Physician:     Shirlee Latch, Corbin Hott P 09/26/2012 6:18 AM

## 2012-09-26 NOTE — ED Notes (Signed)
Pt was given a sandwich and ginger ale  

## 2012-09-26 NOTE — ED Provider Notes (Signed)
Medical screening examination/treatment/procedure(s) were performed by non-physician practitioner and as supervising physician I was immediately available for consultation/collaboration.   Gilda Crease, MD 09/26/12 [phone]

## 2012-09-26 NOTE — ED Notes (Signed)
Pt present to psych ed for detox.  Pt falling to sleep while trying to assess. Pt deneis SI/HI/AH/VH.  Will continue to monitor

## 2012-09-26 NOTE — ED Provider Notes (Signed)
   History    CSN: [id number] Arrival date & time 09/26/12  0007  First MD Initiated Contact with Patient 09/26/12 0017     Chief Complaint  Patient presents with  . Medical Clearance   (Consider location/radiation/quality/duration/timing/severity/associated sxs/prior Treatment) HPI Pt known to the ER for homelessness, SI and alcohol abuse presents to the ER requesting alcohol detox. He last drank 30 minutes ago. He has 5 x 40's per day usually. He has tried detox before. He has schizophrenia and bipolar disorder and is homeless. He denies having SI/HI at the time. Denies hallucinations or delusions. Denies illicit drug use.  Past Medical History  Diagnosis Date  . Alcohol abuse   . Schizophrenia   . Bipolar 1 disorder   . Mental disorder    Past Surgical History  Procedure Laterality Date  . Skin graft     History reviewed. No pertinent family history. History  Substance Use Topics  . Smoking status: Current Every Day Smoker -- 2.00 packs/day for 30 years    Types: Cigarettes  . Smokeless tobacco: Not on file  . Alcohol Use: 12.6 oz/week    21 Cans of beer per week     Comment: drinks as much etoh as he can get per day    Review of Systems  Psychiatric/Behavioral:       Alcohol dependency   All other systems reviewed and are negative.    Allergies  Review of patient's allergies indicates no known allergies.  Home Medications  No current outpatient prescriptions on file. BP 119/79  Pulse 96  Temp(Src) 97.3 F (36.3 C) (Oral)  Resp 16  SpO2 94% Physical Exam  Nursing note and vitals reviewed. Constitutional: He appears well-developed and well-nourished. No distress.  HENT:  Head: Normocephalic and atraumatic.  Eyes: Pupils are equal, round, and reactive to light.  Neck: Normal range of motion. Neck supple.  Cardiovascular: Normal rate and regular rhythm.   Pulmonary/Chest: Effort normal.  Abdominal: Soft.  Neurological: He is alert.  Skin: Skin is warm  and dry.  Psychiatric: His mood appears not anxious. His speech is not rapid and/or pressured. Thought content is not paranoid. He exhibits a depressed mood. He expresses no homicidal and no suicidal ideation. He expresses no suicidal plans and no homicidal plans.    ED Course  Procedures (including critical care time) Labs Reviewed  ACETAMINOPHEN LEVEL  CBC  COMPREHENSIVE METABOLIC PANEL  ETHANOL  SALICYLATE LEVEL  URINE RAPID DRUG SCREEN (HOSP PERFORMED)   No results found. 1. Alcohol abuse     MDM  ACT drawn Holding orders placed. Home Med Rec completed.   Dorthula Matas, PA-C 09/26/12 [phone]

## 2012-09-26 NOTE — BH Assessment (Signed)
[facility] Assessment Progress Note    Pt has issues with [facility] cigarette policy.  [facility] per Delice Bison, does not have beds today.  RTS may be a possibility but process is difficult to access.  Labs were faxed but not put on their form with the same info.  Pt does not appear to be invested in tx.  Pt wants to smoke and only wants to stay for a short period.  Attempts will be made to complete their intake package.

## 2012-09-27 NOTE — BHH Counselor (Signed)
Pt offered but refused treatment at [facility]. Sts that he needs to smoke cigarettes on his terms. Pt not happy about getting a nicotine patch here. Patient apparently frustrated and wanting to discharge from the ED. Patient seems fixated on wanting a cigarette and also going to [facility] to get his disability.   Writer discussed this issue with psychiatry-Dr. Lolly Mustache and NP-Josephine. Patient denies SI and HI. Patient also denies AVH's.   Writer provided patient with a list of substance abuse referrals.

## 2012-09-27 NOTE — ED Provider Notes (Signed)
Per ACT team, pt does not want to go to [facility] because he can't smoke there. [facility] also a non-smoking facility. No SI. Will d/c with outpt resources.   Laray Anger, DO 09/27/12 1007

## 2012-12-04 ENCOUNTER — Emergency Department (HOSPITAL_COMMUNITY)
Admission: EM | Admit: 2012-12-04 | Discharge: 2012-12-05 | Disposition: A | Discharge status: Home / Self Care | Payer: Federal, State, Local not specified - Other | Attending: Dermatology | Admitting: Dermatology

## 2012-12-04 ENCOUNTER — Encounter (HOSPITAL_COMMUNITY): Payer: Self-pay

## 2012-12-04 DIAGNOSIS — F17200 Nicotine dependence, unspecified, uncomplicated: Secondary | ICD-10-CM | POA: Insufficient documentation

## 2012-12-04 DIAGNOSIS — F10929 Alcohol use, unspecified with intoxication, unspecified: Secondary | ICD-10-CM

## 2012-12-04 DIAGNOSIS — Z8659 Personal history of other mental and behavioral disorders: Secondary | ICD-10-CM | POA: Insufficient documentation

## 2012-12-04 DIAGNOSIS — F1020 Alcohol dependence, uncomplicated: Secondary | ICD-10-CM

## 2012-12-04 DIAGNOSIS — F10229 Alcohol dependence with intoxication, unspecified: Secondary | ICD-10-CM | POA: Insufficient documentation

## 2012-12-04 LAB — COMPREHENSIVE METABOLIC PANEL
BUN: 5 mg/dL — ABNORMAL LOW (ref 6–23)
CO2: 23 mEq/L (ref 19–32)
Chloride: 98 mEq/L (ref 96–112)
Creatinine, Ser: 0.59 mg/dL (ref 0.50–1.35)
GFR calc Af Amer: >90 mL/min (ref >90)
GFR calc non Af Amer: >90 mL/min (ref >90)
Total Bilirubin: 0.3 mg/dL (ref 0.3–1.2)

## 2012-12-04 LAB — CBC
Platelets: 247 10*3/uL (ref 150–400)
RBC: 3.89 MIL/uL — ABNORMAL LOW (ref 4.22–5.81)
WBC: 6.1 10*3/uL (ref 4.0–10.5)

## 2012-12-04 LAB — RAPID URINE DRUG SCREEN, HOSP PERFORMED
Amphetamines: NOT DETECTED
Benzodiazepines: NOT DETECTED
Opiates: NOT DETECTED

## 2012-12-04 LAB — ACETAMINOPHEN LEVEL: Acetaminophen (Tylenol), Serum: <15 ug/mL (ref 10–30)

## 2012-12-04 NOTE — ED Provider Notes (Signed)
CSN: [id number]     Arrival date & time 12/04/12  2127 History   First MD Initiated Contact with Patient 12/04/12 2232     Chief Complaint  Patient presents with  . Medical Clearance   (Consider location/radiation/quality/duration/timing/severity/associated sxs/prior Treatment) The history is provided by the patient. The history is limited by the condition of the patient.  pt states hx 'mental health problems' states has been drinking heavily, but cant quantify amount. States he has been drinking similarly for years, no recent change. States is homeless, and wants place to stay tonight. Pt very poor historian.  States physical health at baseline. Denies recent injury or trauma. Triage note noted, however pt states is not hearing voices. Thought process appear clear. Denies suicidal plan/thoughts or harm to others. States he gets frustrated at times.     Past Medical History  Diagnosis Date  . Alcohol abuse   . Schizophrenia   . Bipolar 1 disorder   . Mental disorder    Past Surgical History  Procedure Laterality Date  . Skin graft     No family history on file. History  Substance Use Topics  . Smoking status: Current Every Day Smoker -- 2.00 packs/day for 30 years    Types: Cigarettes  . Smokeless tobacco: Not on file  . Alcohol Use: 12.6 oz/week    21 Cans of beer per week     Comment: drinks as much etoh as he can get per day    Review of Systems  Constitutional: Negative for fever.  HENT: Negative for neck pain.   Eyes: Negative for redness.  Respiratory: Negative for shortness of breath.   Cardiovascular: Negative for chest pain.  Gastrointestinal: Negative for abdominal pain.  Genitourinary: Negative for flank pain.  Musculoskeletal: Negative for back pain.  Skin: Negative for rash.  Neurological: Negative for headaches.  Hematological: Does not bruise/bleed easily.  Psychiatric/Behavioral: Negative for self-injury.       +etoh abuse    Allergies  Review of  patient's allergies indicates no known allergies.  Home Medications  No current outpatient prescriptions on file. BP 112/68  Pulse 93  Temp(Src) 98.4 F (36.9 C) (Oral)  Resp 18  SpO2 100% Physical Exam  Nursing note and vitals reviewed. Constitutional: He is oriented to person, place, and time. He appears well-developed and well-nourished. No distress.  HENT:  Head: Atraumatic.  Eyes: Pupils are equal, round, and reactive to light.  Neck: Neck supple. No tracheal deviation present.  Cardiovascular: Normal rate, regular rhythm, normal heart sounds and intact distal pulses.   Pulmonary/Chest: Effort normal and breath sounds normal. No accessory muscle usage. No respiratory distress.  Abdominal: Soft. Bowel sounds are normal. He exhibits no distension. There is no tenderness.  Musculoskeletal: Normal range of motion. He exhibits no edema and no tenderness.  Neurological: He is alert and oriented to person, place, and time.  Steady gait.   Skin: Skin is warm and dry.  Psychiatric:  Eating contently. Normal mood.     ED Course  Procedures (including critical care time)  Results for orders placed during the hospital encounter of 12/04/12  ETHANOL      Result Value Range   Alcohol, Ethyl (B) 307 (*) 0 - 11 mg/dL  COMPREHENSIVE METABOLIC PANEL      Result Value Range   Sodium 135  135 - 145 mEq/L   Potassium 4.1  3.5 - 5.1 mEq/L   Chloride 98  96 - 112 mEq/L   CO2 23  19 - 32 mEq/L   Glucose, Bld 109 (*) 70 - 99 mg/dL   BUN 5 (*) 6 - 23 mg/dL   Creatinine, Ser 1.61  0.50 - 1.35 mg/dL   Calcium 8.5  8.4 - 09.6 mg/dL   Total Protein 7.6  6.0 - 8.3 g/dL   Albumin 3.7  3.5 - 5.2 g/dL   AST 41 (*) 0 - 37 U/L   ALT 18  0 - 53 U/L   Alkaline Phosphatase 76  39 - 117 U/L   Total Bilirubin 0.3  0.3 - 1.2 mg/dL   GFR calc non Af Amer >90  >90 mL/min   GFR calc Af Amer >90  >90 mL/min  CBC      Result Value Range   WBC 6.1  4.0 - 10.5 K/uL   RBC 3.89 (*) 4.22 - 5.81 MIL/uL    Hemoglobin 13.3  13.0 - 17.0 g/dL   HCT 04.5 (*) 40.9 - 81.1 %   MCV 93.6  78.0 - 100.0 fL   MCH 34.2 (*) 26.0 - 34.0 pg   MCHC 36.5 (*) 30.0 - 36.0 g/dL   RDW 91.4  78.2 - 95.6 %   Platelets 247  150 - 400 K/uL  URINE RAPID DRUG SCREEN (HOSP PERFORMED)      Result Value Range   Opiates NONE DETECTED  NONE DETECTED   Cocaine NONE DETECTED  NONE DETECTED   Benzodiazepines NONE DETECTED  NONE DETECTED   Amphetamines NONE DETECTED  NONE DETECTED   Tetrahydrocannabinol NONE DETECTED  NONE DETECTED   Barbiturates NONE DETECTED  NONE DETECTED  SALICYLATE LEVEL      Result Value Range   Salicylate Lvl <2.0 (*) 2.8 - 20.0 mg/dL  ACETAMINOPHEN LEVEL      Result Value Range   Acetaminophen (Tylenol), Serum <15.0  10 - 30 ug/mL     MDM  Labs.  Pt asking for food/drink, normal appetite/hungry.   Pt w normal mood and affect.   Reviewed nursing notes and prior charts for additional history.   Pt very intoxicated, etoh > 300. Signed out to overnight md, Dr Read Drivers to reassess when more sober and dispo appropriately.      Suzi Roots, MD 12/04/12 2256

## 2012-12-04 NOTE — ED Notes (Signed)
Patient has 3 belonging bags. 1 with a blanket, 1 with a green coat, and 1 with blue jeans, shirt, belt, socks, and sneakers.

## 2012-12-04 NOTE — ED Notes (Signed)
Pt presents with c/o medical clearance. Pt states he "needs psychiatric help". Pt says he is hearing voices "all day long and all night". Pt says the voices are telling him to kill himself and other people. Pt admits to hx of the same in the past. Pt also admits to drug and alcohol use. Pt says he is "on the edge of planning to kill myself".

## 2012-12-05 DIAGNOSIS — F1010 Alcohol abuse, uncomplicated: Secondary | ICD-10-CM

## 2012-12-05 DIAGNOSIS — F1020 Alcohol dependence, uncomplicated: Secondary | ICD-10-CM

## 2012-12-05 NOTE — ED Notes (Signed)
Pt given discharge instructions. No concerns voiced.

## 2012-12-05 NOTE — BHH Suicide Risk Assessment (Signed)
Suicide Risk Assessment  Discharge Assessment     Demographic Factors:  Male, Adolescent or young adult, Caucasian, Low socioeconomic status, Living alone and Unemployed  Mental Status Per Nursing Assessment::   On Admission:     Current Mental Status by Physician: NA  Loss Factors: Mental Status Examination: Patient appeared as per his stated age, casually dressed, and fairly groomed, and maintaining good eye contact. Patient has good mood and his affect was constricted. He has normal rate, rhythm, and volume of speech. His thought process is linear and goal directed. Patient has denied suicidal, homicidal ideations, intentions or plans. Patient has no evidence of auditory or visual hallucinations, delusions, and paranoia. Patient has fair insight judgment and impulse control.  Historical Factors: Impulsivity  Risk Reduction Factors:   Sense of responsibility to family, Positive social support and Positive therapeutic relationship  Continued Clinical Symptoms:  Alcohol/Substance Abuse/Dependencies Previous Psychiatric Diagnoses and Treatments  Cognitive Features That Contribute To Risk:  Polarized thinking    Suicide Risk:  Minimal: No identifiable suicidal ideation.  Patients presenting with no risk factors but with morbid ruminations; may be classified as minimal risk based on the severity of the depressive symptoms  Discharge Diagnoses:   AXIS I:  Substance Induced Mood Disorder and Alcohol dependence and cannabis abuse AXIS II:  Deferred AXIS III:   Past Medical History  Diagnosis Date  . Alcohol abuse   . Schizophrenia   . Bipolar 1 disorder   . Mental disorder    AXIS IV:  economic problems, housing problems, occupational problems, other psychosocial or environmental problems, problems related to social environment and problems with primary support group AXIS V:  61-70 mild symptoms  Plan Of Care/Follow-up recommendations:  Activity as tolerated and Diet ;  Regular  Is patient on multiple antipsychotic therapies at discharge:  No   Has Patient had three or more failed trials of antipsychotic monotherapy by history:  No  Recommended Plan for Multiple Antipsychotic Therapies: none  Perry Brucato,JANARDHAHA R. 12/05/2012, 12:07 PM

## 2012-12-05 NOTE — ED Provider Notes (Signed)
I was not directly involved in this patient's care. He was evaluated by psychiatry and per the note in the chart he was cleared for discharge home. He declined detox.  Shon Baton, MD 12/05/12 1336

## 2012-12-05 NOTE — ED Notes (Signed)
Pt alert x4 v/s stable. Admitted for alcohol intoxication. Will be d/c in the am. Will continue to monitor.

## 2012-12-05 NOTE — ED Notes (Signed)
Pt discharged to home. Left unit ambulating to checkout accompanied by nurse tech. Bus pass provided by Child psychotherapist. Left in good condition. Vwilliams,rn.

## 2012-12-05 NOTE — Consult Note (Signed)
Reason for Consult: Alcohol intoxication without withdrawal symptoms Referring Physician: ER physician  Jimmy [name] is an 57 y.o. male.  HPI: Patient is seen and chart reviewed. Patient reported he is being alcoholic and has multiple previous the detox treatment in rehabilitation services. Patient stated he does not want to be treated at this time and is not motivated to stop drinking alcohol at this time. Patient denies symptoms of depression anxiety and psychotic symptoms. Patient denied drug of abuse other than alcohol. Patient has a this to previous acute psychiatric hospitalization for the alcohol dependence and the detox treatment at behavioral health hospital. Patient has been sobered up this morning and refused treatment repeatedly. Patient has no identifiable recent change in he has been homeless, and not seeking placement in a shelter. Reportedly physical health at baseline. Patient states is not hearing voices and his thought process appear clear. Denies suicidal plan/thoughts or harm to others.   Mental Status Examination: Patient appeared as per his stated age,  dressed in hospital scrubs, and fairly groomed, and maintaining good eye contact. Patient has good mood and his affect was constricted. He has normal rate, rhythm, and volume of speech. His thought process is linear and goal directed. Patient has denied suicidal, homicidal ideations, intentions or plans. Patient has no evidence of auditory or visual hallucinations, delusions, and paranoia. Patient has fair insight judgment and impulse control.  Past Medical History  Diagnosis Date  . Alcohol abuse   . Schizophrenia   . Bipolar 1 disorder   . Mental disorder     Past Surgical History  Procedure Laterality Date  . Skin graft      No family history on file.  Social History:  reports that he has been smoking Cigarettes.  He has a 60 pack-year smoking history. He does not have any smokeless tobacco history on file. He  reports that he drinks about 12.6 ounces of alcohol per week. He reports that he uses illicit drugs (Cocaine and Marijuana).  Allergies: No Known Allergies  Medications: I have reviewed the patient's current medications.  Results for orders placed during the hospital encounter of 12/04/12 (from the past 48 hour(s))  URINE RAPID DRUG SCREEN (HOSP PERFORMED)     Status: None   Collection Time    12/04/12 10:04 PM      Result Value Range   Opiates NONE DETECTED  NONE DETECTED   Cocaine NONE DETECTED  NONE DETECTED   Benzodiazepines NONE DETECTED  NONE DETECTED   Amphetamines NONE DETECTED  NONE DETECTED   Tetrahydrocannabinol NONE DETECTED  NONE DETECTED   Barbiturates NONE DETECTED  NONE DETECTED   Comment:            DRUG SCREEN FOR MEDICAL PURPOSES     ONLY.  IF CONFIRMATION IS NEEDED     FOR ANY PURPOSE, NOTIFY LAB     WITHIN 5 DAYS.                LOWEST DETECTABLE LIMITS     FOR URINE DRUG SCREEN     Drug Class       Cutoff (ng/mL)     Amphetamine      1000     Barbiturate      200     Benzodiazepine   200     Tricyclics       300     Opiates          300     Cocaine  300     THC              50  ETHANOL     Status: Abnormal   Collection Time    12/04/12 10:06 PM      Result Value Range   Alcohol, Ethyl (B) 307 (*) 0 - 11 mg/dL   Comment:            LOWEST DETECTABLE LIMIT FOR     SERUM ALCOHOL IS 11 mg/dL     FOR MEDICAL PURPOSES ONLY  COMPREHENSIVE METABOLIC PANEL     Status: Abnormal   Collection Time    12/04/12 10:06 PM      Result Value Range   Sodium 135  135 - 145 mEq/L   Potassium 4.1  3.5 - 5.1 mEq/L   Chloride 98  96 - 112 mEq/L   CO2 23  19 - 32 mEq/L   Glucose, Bld 109 (*) 70 - 99 mg/dL   BUN 5 (*) 6 - 23 mg/dL   Creatinine, Ser 1.61  0.50 - 1.35 mg/dL   Calcium 8.5  8.4 - 09.6 mg/dL   Total Protein 7.6  6.0 - 8.3 g/dL   Albumin 3.7  3.5 - 5.2 g/dL   AST 41 (*) 0 - 37 U/L   ALT 18  0 - 53 U/L   Alkaline Phosphatase 76  39 - 117 U/L    Total Bilirubin 0.3  0.3 - 1.2 mg/dL   GFR calc non Af Amer >90  >90 mL/min   GFR calc Af Amer >90  >90 mL/min   Comment: (NOTE)     The eGFR has been calculated using the CKD EPI equation.     This calculation has not been validated in all clinical situations.     eGFR's persistently <90 mL/min signify possible Chronic Kidney     Disease.  CBC     Status: Abnormal   Collection Time    12/04/12 10:06 PM      Result Value Range   WBC 6.1  4.0 - 10.5 K/uL   RBC 3.89 (*) 4.22 - 5.81 MIL/uL   Hemoglobin 13.3  13.0 - 17.0 g/dL   HCT 04.5 (*) 40.9 - 81.1 %   MCV 93.6  78.0 - 100.0 fL   MCH 34.2 (*) 26.0 - 34.0 pg   MCHC 36.5 (*) 30.0 - 36.0 g/dL   RDW 91.4  78.2 - 95.6 %   Platelets 247  150 - 400 K/uL  SALICYLATE LEVEL     Status: Abnormal   Collection Time    12/04/12 10:06 PM      Result Value Range   Salicylate Lvl <2.0 (*) 2.8 - 20.0 mg/dL  ACETAMINOPHEN LEVEL     Status: None   Collection Time    12/04/12 10:06 PM      Result Value Range   Acetaminophen (Tylenol), Serum <15.0  10 - 30 ug/mL   Comment:            THERAPEUTIC CONCENTRATIONS VARY     SIGNIFICANTLY. A RANGE OF 10-30     ug/mL MAY BE AN EFFECTIVE     CONCENTRATION FOR MANY PATIENTS.     HOWEVER, SOME ARE BEST TREATED     AT CONCENTRATIONS OUTSIDE THIS     RANGE.     ACETAMINOPHEN CONCENTRATIONS     >150 ug/mL AT 4 HOURS AFTER     INGESTION AND >50 ug/mL AT 12     HOURS  AFTER INGESTION ARE     OFTEN ASSOCIATED WITH TOXIC     REACTIONS.    No results found.  Positive for excessive alcohol consumption Blood pressure 126/68, pulse 91, temperature 98.3 F (36.8 C), temperature source Axillary, resp. rate 20, SpO2 100.00%.   Assessment/Plan: Alcohol dependence Alcohol intoxication without withdrawal symptoms  Patient does not meet criteria for acute psychiatric hospitalization as she does not have a safety concerns Patient refused alcohol detox treatment and Patient will be referred to the outpatient  psychiatric services and substance abuse treatment program Appreciate psychiatric consultation  Keona Bilyeu,JANARDHAHA R. 12/05/2012, 12:13 PM

## 2012-12-05 NOTE — ED Notes (Signed)
Pt sitting at bedside eating lunch tray. Sitter watching pt. Will monitor

## 2013-03-01 ENCOUNTER — Encounter (HOSPITAL_COMMUNITY): Payer: Self-pay | Admitting: Emergency Medicine

## 2013-03-01 ENCOUNTER — Emergency Department (HOSPITAL_COMMUNITY)
Admission: EM | Admit: 2013-03-01 | Discharge: 2013-03-01 | Disposition: A | Discharge status: Home / Self Care | Payer: Self-pay

## 2013-03-01 DIAGNOSIS — Z5900 Homelessness unspecified: Secondary | ICD-10-CM | POA: Insufficient documentation

## 2013-03-01 DIAGNOSIS — F17200 Nicotine dependence, unspecified, uncomplicated: Secondary | ICD-10-CM | POA: Insufficient documentation

## 2013-03-01 DIAGNOSIS — R45851 Suicidal ideations: Secondary | ICD-10-CM | POA: Insufficient documentation

## 2013-03-01 DIAGNOSIS — F209 Schizophrenia, unspecified: Secondary | ICD-10-CM | POA: Insufficient documentation

## 2013-03-01 DIAGNOSIS — R45850 Homicidal ideations: Secondary | ICD-10-CM | POA: Insufficient documentation

## 2013-03-01 DIAGNOSIS — F1010 Alcohol abuse, uncomplicated: Secondary | ICD-10-CM

## 2013-03-01 DIAGNOSIS — F331 Major depressive disorder, recurrent, moderate: Secondary | ICD-10-CM

## 2013-03-01 LAB — COMPREHENSIVE METABOLIC PANEL
AST: 73 U/L — ABNORMAL HIGH (ref 0–37)
Albumin: 4 g/dL (ref 3.5–5.2)
Alkaline Phosphatase: 84 U/L (ref 39–117)
BUN: 6 mg/dL (ref 6–23)
CO2: 21 mEq/L (ref 19–32)
Chloride: 99 mEq/L (ref 96–112)
Creatinine, Ser: 0.57 mg/dL (ref 0.50–1.35)
GFR calc Af Amer: >90 mL/min (ref >90)
GFR calc non Af Amer: >90 mL/min (ref >90)
Glucose, Bld: 83 mg/dL (ref 70–99)
Potassium: 3.8 mEq/L (ref 3.5–5.1)
Total Bilirubin: 0.5 mg/dL (ref 0.3–1.2)

## 2013-03-01 LAB — RAPID URINE DRUG SCREEN, HOSP PERFORMED
Amphetamines: NOT DETECTED
Barbiturates: NOT DETECTED
Benzodiazepines: NOT DETECTED
Opiates: NOT DETECTED

## 2013-03-01 LAB — CBC
Hemoglobin: 14 g/dL (ref 13.0–17.0)
MCH: 33.3 pg (ref 26.0–34.0)
MCV: 95.2 fL (ref 78.0–100.0)
RBC: 4.21 MIL/uL — ABNORMAL LOW (ref 4.22–5.81)

## 2013-03-01 LAB — SALICYLATE LEVEL: Salicylate Lvl: <2 mg/dL — ABNORMAL LOW (ref 2.8–20.0)

## 2013-03-01 MED ORDER — ONDANSETRON HCL 4 MG PO TABS: 4.0000 mg | ORAL_TABLET | Freq: Three times a day (TID) | ORAL | Status: DC | PRN

## 2013-03-01 MED ORDER — ALUM & MAG HYDROXIDE-SIMETH 200-200-20 MG/5ML PO SUSP: 30.0000 mL | ORAL | Status: DC | PRN

## 2013-03-01 MED ORDER — ZOLPIDEM TARTRATE 5 MG PO TABS: 5.0000 mg | ORAL_TABLET | Freq: Every evening | ORAL | Status: DC | PRN

## 2013-03-01 MED ORDER — NICOTINE 21 MG/24HR TD PT24
21.0000 mg | MEDICATED_PATCH | Freq: Every day | TRANSDERMAL | Status: DC
  Filled 2013-03-01: qty 1

## 2013-03-01 MED ORDER — IBUPROFEN 200 MG PO TABS: 600.0000 mg | ORAL_TABLET | Freq: Three times a day (TID) | ORAL | Status: DC | PRN

## 2013-03-01 NOTE — Progress Notes (Signed)
Writer consulted with the Psychiatrist (Dr. Ladona Ridgel) and the NP [facility]) regarding the patient not meeting criteria for inpatient hospitalization.   Writer informed the ER MD (Dr. Dawna Part) and the nurse Candise Bowens) that the patient will receive outpatient referrals for [facility].   The patient will not receive any prescriptions upon discharge.

## 2013-03-01 NOTE — Progress Notes (Signed)
[facility] provided pt with a [facility] Orange Card application, highlighting [facility].  °

## 2013-03-01 NOTE — ED Notes (Signed)
Pt transferred from triage, presents with c/o SI, plan to cut self and HI, no specific plan.  Admits to AV hallucinations, will not elaborate further, feeling hopeless. Pt reports he attempted SI more than 30yr ago, by attempting to cut self with glass.  Pt reports diag. With Bipolar, Schizophrenia and Depression.  Pt states he has been off meds for yrs. Pt states he is an occasional drinker, denies drug use.  Pt calm & cooperative at present.

## 2013-03-01 NOTE — ED Provider Notes (Signed)
TIME SEEN: 3:22 AM  CHIEF COMPLAINT: Homicidal ideation, command hallucinations  HPI: Patient is a 57 y.o. male with a history of schizophrenia and bipolar who presents the emergency department with homicidal ideation that started today. He reports he is hearing voices are telling him to kill people but no one in particular. He denies any suicidal ideation currently but states he has had some in the past. He reports he has been off of medication for several months. He is homeless. He recently moved here from [place]. He states he did drink alcohol today. He has not had any drugs recently.  ROS: See HPI Constitutional: no fever  Eyes: no drainage  ENT: no runny nose   Cardiovascular:  no chest pain  Resp: no SOB  GI: no vomiting GU: no dysuria Integumentary: no rash  Allergy: no hives  Musculoskeletal: no leg swelling  Neurological: no slurred speech ROS otherwise negative  PAST MEDICAL HISTORY/PAST SURGICAL HISTORY:  Past Medical History  Diagnosis Date  . Alcohol abuse   . Schizophrenia   . Bipolar 1 disorder   . Mental disorder     MEDICATIONS:  Prior to Admission medications   Not on File    ALLERGIES:  No Known Allergies  SOCIAL HISTORY:  History  Substance Use Topics  . Smoking status: Current Every Day Smoker -- 2.00 packs/day for 30 years    Types: Cigarettes  . Smokeless tobacco: Not on file  . Alcohol Use: 12.6 oz/week    21 Cans of beer per week     Comment: drinks as much etoh as he can get per day    FAMILY HISTORY: History reviewed. No pertinent family history.  EXAM: There were no vitals taken for this visit. CONSTITUTIONAL: Alert and oriented and responds appropriately to questions. Well-appearing; well-nourished HEAD: Normocephalic EYES: Conjunctivae clear, PERRL ENT: normal nose; no rhinorrhea; moist mucous membranes; pharynx without lesions noted NECK: Supple, no meningismus, no LAD  CARD: RRR; S1 and S2 appreciated; no murmurs, no clicks,  no rubs, no gallops RESP: Normal chest excursion without splinting or tachypnea; breath sounds clear and equal bilaterally; no wheezes, no rhonchi, no rales,  ABD/GI: Normal bowel sounds; non-distended; soft, non-tender, no rebound, no guarding BACK:  The back appears normal and is non-tender to palpation, there is no CVA tenderness EXT: Normal ROM in all joints; non-tender to palpation; no edema; normal capillary refill; no cyanosis    SKIN: Normal color for age and race; warm NEURO: Moves all extremities equally; no facial droop or slurred speech PSYCH: The patient's mood and manner are appropriate. Grooming and personal hygiene are appropriate.  MEDICAL DECISION MAKING: Patient here with homicidal ideation and command hallucinations. He does appear slightly intoxicated on exam. No other medical complaints. He is hemodynamically stable. Will obtain labs, urine for medical evaluation. Will discuss with psychiatry for further evaluation. Patient agrees to stay voluntarily at this time.  ED PROGRESS: Patient's labs are unremarkable other than alcohol level of 194. Urine drug screen pending.   UDS negative.  Pt is medically stable.  Patient is awaiting behavioral health disposition.  Layla Maw Luvia Orzechowski, DO 03/01/13 [phone]

## 2013-03-01 NOTE — ED Notes (Signed)
Pt reports that he is SI/HI with no specific plan but to take some one out. Pt also reports hearing voice that are telling him to kill someone. Pt seems irritate, anxious and paranoid.

## 2013-03-01 NOTE — ED Notes (Signed)
Pt has x3 bags: 1 green coat, 1 pair white shoes, 1 pair white socks, 1 blue scarf, 1  Black shirt, 1 pair jeans, 1 brown belt, 1 pair thermal top and bottom, 1 red shirt, and 1 red sweatshirt

## 2013-03-01 NOTE — BH Assessment (Signed)
Tele Assessment Note   Jimmy [name] is an 57 y.o. male. Pt presents voluntarily to [facility]. Upon admission, BAL was 194 upon admission. Per RN note at admission, "Pt reports that he is SI/HI with no specific plan but to take some one out. Pt also reports hearing voice that are telling him to kill someone. Pt seems irritate, anxious and paranoid. "   Pt currently denies SI and HI. He endorses AH. Pt unable to state what the "voices" say to him. He says, "It just depends".   Pt sts the voices tell him "to hurt others". Pt sts he hasn't heard voices while at [facility]. Pt sts he tried to kill himself w/ a broken beer bottle by cutting his neck and wrist. Pt has poor eye contact during assessment and hesitates for a few secs before answering writer's questions. Pt sts he feels safe to go back home. [place]. Pt describes himself as "bipolar". Pt sts he has fam history of MI and that his paternal grandfather committed suicide. Pt sts he has two brothers in [facility] who are supportive. Per chart review, pt was inpatient at [facility] 10/22/11 and 11/11/11 for alcohol dependence. Pt sts he drinks "almost daily" and drinks approx. ten to twelve 12-oz beers. No delusions noted. Pt sts he had seizure in 2007 after he drank two beers, but he isn't sure if seizures was caused by the alcohol. UDS was negative. Pt endorses euthymic mood. Per chart review, pt was supposed to go to [facility] a few mos ago but didn't go b/c [facility] doesn't let him smoke. He says one of the reasons he came to [facility] was because "I was about to freeze to death, sleeping in a mud hole". Pt cooperative and oriented. Pt says he doesn't see anyone currently for [facility] outpatient treatment and he isn't taking any psych meds.   Axis I: Alcohol Use Disorder, Moderate            Bipolar I Disorder Axis II: Deferred Axis III:  Past Medical History  Diagnosis Date  . Alcohol abuse   . Schizophrenia   . Bipolar 1 disorder   . Mental disorder    Axis  IV: economic problems, housing problems, other psychosocial or environmental problems, problems related to social environment and problems with access to health care services Axis V: 41-50 serious symptoms  Past Medical History:  Past Medical History  Diagnosis Date  . Alcohol abuse   . Schizophrenia   . Bipolar 1 disorder   . Mental disorder     Past Surgical History  Procedure Laterality Date  . Skin graft      Family History: History reviewed. No pertinent family history.  Social History:  reports that he has been smoking Cigarettes.  He has a 60 pack-year smoking history. He does not have any smokeless tobacco history on file. He reports that he drinks about 12.6 ounces of alcohol per week. He reports that he uses illicit drugs (Cocaine and Marijuana).  Additional Social History:  Alcohol / Drug Use Pain Medications: see PTA meds list Prescriptions: see PTA meds list Over the Counter: see PTA meds list History of alcohol / drug use?: Yes Negative Consequences of Use: Financial;Legal;Personal relationships;Work / Mining engineer #1 Name of Substance 1: alcohol 1 - Age of First Use: 13 1 - Amount (size/oz): varies 1 - Frequency: almost daily 1 - Duration: years 1 - Last Use / Amount: 04/01/12 - pt unsure of  amount  CIWA: CIWA-Ar BP: 121/69 mmHg Pulse Rate: 86 COWS:    Allergies: No Known Allergies  Home Medications:  (Not in a hospital admission)  OB/GYN Status:  No LMP for male patient.  General Assessment Data Location of Assessment: [facility] ED Is this a Tele or Face-to-Face Assessment?: Tele Assessment Is this an Initial Assessment or a Re-assessment for this encounter?: Initial Assessment Living Arrangements: Other (Comment) (homeless) Can pt return to current living arrangement?: Yes Admission Status: Voluntary Is patient capable of signing voluntary admission?: Yes Transfer from: Home Referral Source: Other (EMS)     [facility] Crisis Care Plan Living  Arrangements: Other (Comment) (homeless)  Education Status Is patient currently in school?: No Current Grade: na Highest grade of school patient has completed: 8 Name of school: ma Contact person: na  Risk to self Suicidal Ideation: No Suicidal Intent: No Is patient at risk for suicide?: No Suicidal Plan?: No Access to Means: No What has been your use of drugs/alcohol within the last 12 months?: almost daily alcohol use Previous Attempts/Gestures: Yes How many times?: 1 (tried to cut self w/ broken bottle) Other Self Harm Risks: none Triggers for Past Attempts: Unpredictable;Other (Comment) (intoxication) Intentional Self Injurious Behavior: None Family Suicide History: Yes (paternal grandfather committed suicide, fam hx of MI) Recent stressful life event(s): Other (Comment) (homelessness, AH) Persecutory voices/beliefs?: No Depression: No Substance abuse history and/or treatment for substance abuse?: Yes Suicide prevention information given to non-admitted patients: Not applicable  Risk to Others Homicidal Ideation: No Thoughts of Harm to Others: No Current Homicidal Intent: No Current Homicidal Plan: No Access to Homicidal Means: No Identified Victim: none History of harm to others?: No Assessment of Violence: None Noted Violent Behavior Description: pt calm during assessment Does patient have access to weapons?: No Criminal Charges Pending?: No Does patient have a court date: No  Psychosis Hallucinations: Auditory;With command (pt sts AH tell him to harm people, denies specific planintnt) Delusions: None noted  Mental Status Report Appear/Hygiene: Disheveled Eye Contact: Poor Motor Activity: Freedom of movement Speech: Logical/coherent;Slow Level of Consciousness: Alert Mood: Other (Comment) (euthymic) Affect: Blunted Anxiety Level: None Thought Processes: Coherent;Relevant Judgement: Unimpaired Orientation: Person;Place;Time;Situation Obsessive Compulsive  Thoughts/Behaviors: None  Cognitive Functioning Concentration: Normal Memory: Recent Intact;Remote Intact IQ: Average Insight: Fair Impulse Control: Fair Appetite: Fair Sleep: No Change Total Hours of Sleep: 4 Vegetative Symptoms: None  ADLScreening [facility]) Patient's cognitive ability adequate to safely complete daily activities?: Yes Patient able to express need for assistance with ADLs?: Yes Independently performs ADLs?: Yes (appropriate for developmental age)  Prior Inpatient Therapy Prior Inpatient Therapy: Yes Prior Therapy Dates: 2013 and earlier Prior Therapy Facilty/Provider(s): [facility], [place], [place] Reason for Treatment: alcohol dependence, bipolar d/o  Prior Outpatient Therapy Prior Outpatient Therapy: Yes Prior Therapy Dates: 2007 Prior Therapy Facilty/Provider(s): [facility] Reason for Treatment: bipolar disorder  ADL Screening (condition at time of admission) Patient's cognitive ability adequate to safely complete daily activities?: Yes Is the patient deaf or have difficulty hearing?: No Does the patient have difficulty seeing, even when wearing glasses/contacts?: No Does the patient have difficulty concentrating, remembering, or making decisions?: No Patient able to express need for assistance with ADLs?: Yes Does the patient have difficulty dressing or bathing?: No Independently performs ADLs?: Yes (appropriate for developmental age) Does the patient have difficulty walking or climbing stairs?: No Weakness of Legs: None Weakness of Arms/Hands: None       Abuse/Neglect Assessment (Assessment to be complete while patient  is alone) Physical Abuse: Denies Verbal Abuse: Denies Sexual Abuse: Denies Exploitation of patient/patient's resources: Denies Values / Beliefs Cultural Requests During Hospitalization: None Spiritual Requests During Hospitalization: None   Advance Directives (For  Healthcare) Advance Directive: Patient does not have advance directive;Patient would not like information    Additional Information 1:1 In Past 12 Months?: No CIRT Risk: No Elopement Risk: No Does patient have medical clearance?: Yes     Disposition:  Disposition Initial Assessment Completed for this Encounter: Yes Disposition of Patient:  (pt to be evaluated by extenders to determine disposition)  Escher Harr P 03/01/2013 8:33 AM

## 2013-03-01 NOTE — Consult Note (Signed)
[facility] Face-to-Face Psychiatry Consult   Reason for Consult:  Reportedly hearing voices, drinking and having homicidal thoughts Referring Physician:  ER MD  Jimmy [name] is an 57 y.o. male.  Assessment: AXIS I:  major depression, recurrent moderate and alcohol dependence AXIS II:  Deferred AXIS III:   Past Medical History  Diagnosis Date  . Alcohol abuse   . Schizophrenia   . Bipolar 1 disorder   . Mental disorder    AXIS IV:  economic problems, housing problems and homelessnes is his biggest problem AXIS V:  51-60 moderate symptoms  Plan:  No evidence of imminent risk to self or others at present.    Subjective:   Jimmy [name] is a 57 y.o. male patient admitted with blood alcohol of just under 200 and threats to kill others in a vague non specific way.  HPI:  Jimmy [name] says he drinks about 12 beers daily and more if he can get it.  He is homeless and has been for the past 4 years and he hates it.  Once he has sobered up,he is no longer making threats.  He wants to apply for disability and is getting help at the [facility] with that as well as an ID and a place to stay so he says he wants to be discharged.  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  He denies any suicidal ideation.   Past Psychiatric History: Past Medical History  Diagnosis Date  . Alcohol abuse   . Schizophrenia   . Bipolar 1 disorder   . Mental disorder     reports that he has been smoking Cigarettes.  He has a 60 pack-year smoking history. He does not have any smokeless tobacco history on file. He reports that he drinks about 12.6 ounces of alcohol per week. He reports that he uses illicit drugs (Cocaine and Marijuana). History reviewed. No pertinent family history. Family History Substance Abuse: Yes, Describe: Family Supports: Yes, List: (two brothers in [place]) Living Arrangements: Other (Comment) (homeless) Can pt return to current living arrangement?: Yes Abuse/Neglect  [facility]) Physical Abuse: Denies Verbal Abuse: Denies Sexual Abuse: Denies Allergies:  No Known Allergies  ACT Assessment Complete:  Yes:    Educational Status    Risk to Self: Risk to self Suicidal Ideation: No Suicidal Intent: No Is patient at risk for suicide?: No Suicidal Plan?: No Access to Means: No What has been your use of drugs/alcohol within the last 12 months?: almost daily alcohol use Previous Attempts/Gestures: Yes How many times?: 1 (tried to cut self w/ broken bottle) Other Self Harm Risks: none Triggers for Past Attempts: Unpredictable;Other (Comment) (intoxication) Intentional Self Injurious Behavior: None Family Suicide History: Yes (paternal grandfather committed suicide, fam hx of MI) Recent stressful life event(s): Other (Comment) (homelessness, AH) Persecutory voices/beliefs?: No Depression: No Substance abuse history and/or treatment for substance abuse?: Yes Suicide prevention information given to non-admitted patients: Not applicable  Risk to Others: Risk to Others Homicidal Ideation: No Thoughts of Harm to Others: No Current Homicidal Intent: No Current Homicidal Plan: No Access to Homicidal Means: No Identified Victim: none History of harm to others?: No Assessment of Violence: None Noted Violent Behavior Description: pt calm during assessment Does patient have access to weapons?: No Criminal Charges Pending?: No Does patient have a court date: No  Abuse: Abuse/Neglect Assessment (Assessment to be complete while patient is alone) Physical Abuse: Denies Verbal Abuse: Denies Sexual Abuse: Denies Exploitation  of patient/patient's resources: Denies  Prior Inpatient Therapy: Prior Inpatient Therapy Prior Inpatient Therapy: Yes Prior Therapy Dates: 2013 and earlier Prior Therapy Facilty/Provider(s): [facility], [place], [place] Reason for Treatment: alcohol dependence, bipolar d/o  Prior Outpatient Therapy: Prior Outpatient  Therapy Prior Outpatient Therapy: Yes Prior Therapy Dates: 2007 Prior Therapy Facilty/Provider(s): [facility] Reason for Treatment: bipolar disorder  Additional Information: Additional Information 1:1 In Past 12 Months?: No CIRT Risk: No Elopement Risk: No Does patient have medical clearance?: Yes                  Objective: Blood pressure 121/69, pulse 86, temperature 98.4 F (36.9 C), temperature source Oral, resp. rate 18, height 5\' 8"  (1.727 m), weight 68.04 kg (150 lb), SpO2 96.00%.Body mass index is 22.81 kg/(m^2). Results for orders placed during the hospital encounter of 03/01/13 (from the past 72 hour(s))  ACETAMINOPHEN LEVEL     Status: None   Collection Time    03/01/13  3:23 AM      Result Value Range   Acetaminophen (Tylenol), Serum <15.0  10 - 30 ug/mL   Comment:            THERAPEUTIC CONCENTRATIONS VARY     SIGNIFICANTLY. A RANGE OF 10-30     ug/mL MAY BE AN EFFECTIVE     CONCENTRATION FOR MANY PATIENTS.     HOWEVER, SOME ARE BEST TREATED     AT CONCENTRATIONS OUTSIDE THIS     RANGE.     ACETAMINOPHEN CONCENTRATIONS     >150 ug/mL AT 4 HOURS AFTER     INGESTION AND >50 ug/mL AT 12     HOURS AFTER INGESTION ARE     OFTEN ASSOCIATED WITH TOXIC     REACTIONS.  CBC     Status: Abnormal   Collection Time    03/01/13  3:23 AM      Result Value Range   WBC 6.9  4.0 - 10.5 K/uL   RBC 4.21 (*) 4.22 - 5.81 MIL/uL   Hemoglobin 14.0  13.0 - 17.0 g/dL   HCT 09.8  11.9 - 14.7 %   MCV 95.2  78.0 - 100.0 fL   MCH 33.3  26.0 - 34.0 pg   MCHC 34.9  30.0 - 36.0 g/dL   RDW 82.9  56.2 - 13.0 %   Platelets 270  150 - 400 K/uL  COMPREHENSIVE METABOLIC PANEL     Status: Abnormal   Collection Time    03/01/13  3:23 AM      Result Value Range   Sodium 134 (*) 135 - 145 mEq/L   Potassium 3.8  3.5 - 5.1 mEq/L   Chloride 99  96 - 112 mEq/L   CO2 21  19 - 32 mEq/L   Glucose, Bld 83  70 - 99 mg/dL   BUN 6  6 - 23 mg/dL   Creatinine, Ser 8.65  0.50  - 1.35 mg/dL   Calcium 8.9  8.4 - 78.4 mg/dL   Total Protein 8.2  6.0 - 8.3 g/dL   Albumin 4.0  3.5 - 5.2 g/dL   AST 73 (*) 0 - 37 U/L   ALT 47  0 - 53 U/L   Alkaline Phosphatase 84  39 - 117 U/L   Total Bilirubin 0.5  0.3 - 1.2 mg/dL   GFR calc non Af Amer >90  >90 mL/min   GFR calc Af Amer >90  >90 mL/min   Comment: (NOTE)  The eGFR has been calculated using the CKD EPI equation.     This calculation has not been validated in all clinical situations.     eGFR's persistently <90 mL/min signify possible Chronic Kidney     Disease.  ETHANOL     Status: Abnormal   Collection Time    03/01/13  3:23 AM      Result Value Range   Alcohol, Ethyl (B) 194 (*) 0 - 11 mg/dL   Comment:            LOWEST DETECTABLE LIMIT FOR     SERUM ALCOHOL IS 11 mg/dL     FOR MEDICAL PURPOSES ONLY  SALICYLATE LEVEL     Status: Abnormal   Collection Time    03/01/13  3:23 AM      Result Value Range   Salicylate Lvl <2.0 (*) 2.8 - 20.0 mg/dL  URINE RAPID DRUG SCREEN (HOSP PERFORMED)     Status: None   Collection Time    03/01/13  6:30 AM      Result Value Range   Opiates NONE DETECTED  NONE DETECTED   Cocaine NONE DETECTED  NONE DETECTED   Benzodiazepines NONE DETECTED  NONE DETECTED   Amphetamines NONE DETECTED  NONE DETECTED   Tetrahydrocannabinol NONE DETECTED  NONE DETECTED   Barbiturates NONE DETECTED  NONE DETECTED   Comment:            DRUG SCREEN FOR MEDICAL PURPOSES     ONLY.  IF CONFIRMATION IS NEEDED     FOR ANY PURPOSE, NOTIFY LAB     WITHIN 5 DAYS.                LOWEST DETECTABLE LIMITS     FOR URINE DRUG SCREEN     Drug Class       Cutoff (ng/mL)     Amphetamine      1000     Barbiturate      200     Benzodiazepine   200     Tricyclics       300     Opiates          300     Cocaine          300     THC              50   Labs are reviewed and are pertinent for blood alcohol and his liver function tests are actually pretty good.  Current Facility-Administered Medications   Medication Dose Route Frequency Provider Last Rate Last Dose  . alum & mag hydroxide-simeth (MAALOX/MYLANTA) 200-200-20 MG/5ML suspension 30 mL  30 mL Oral PRN Kristen N Ward, DO      . ibuprofen (ADVIL,MOTRIN) tablet 600 mg  600 mg Oral Q8H PRN Kristen N Ward, DO      . nicotine (NICODERM CQ - dosed in mg/24 hours) patch 21 mg  21 mg Transdermal Daily Kristen N Ward, DO      . ondansetron (ZOFRAN) tablet 4 mg  4 mg Oral Q8H PRN Kristen N Ward, DO      . zolpidem (AMBIEN) tablet 5 mg  5 mg Oral QHS PRN Kristen N Ward, DO       No current outpatient prescriptions on file.    Psychiatric Specialty Exam:     Blood pressure 121/69, pulse 86, temperature 98.4 F (36.9 C), temperature source Oral, resp. rate 18, height 5\' 8"  (1.727 m), weight 68.04 kg (150  lb), SpO2 96.00%.Body mass index is 22.81 kg/(m^2).  General Appearance: Casual  Eye Contact::  Good  Speech:  Clear and Coherent  Volume:  Normal  Mood:  Depressed  Affect:  Appropriate  Thought Process:  Logical  Orientation:  Full (Time, Place, and Person)  Thought Content:  Negative  Suicidal Thoughts:  No  Homicidal Thoughts:  No  Memory:  Immediate;   Good Recent;   Good Remote;   Good  Judgement:  Intact  Insight:  Fair  Psychomotor Activity:  Normal  Concentration:  Good  Recall:  Good  Akathisia:  Negative  Handed:  Right  AIMS (if indicated):     Assets:  Desire for Improvement  Sleep:      Treatment Plan Summary: Discharge home today to be followed by [facility] for homeless help and [facility] for medication.  Anida Deol D 03/01/2013 9:54 AM

## 2013-03-01 NOTE — ED Notes (Signed)
Per EMS: Pt reports SI/HI, feeling this way all day. Pt has hx of schzio. Pt reports voices making him feel this way. Calm cooperative.

## 2013-03-01 NOTE — ED Provider Notes (Signed)
11:43 AM The patient was seen and evaluated by the psychiatry team and was deemed to be safe for discharge.  They deny blood the patient is a threat to himself or to others.  I did not personally evaluate this patient.  Discharge paperwork completed`  Lyanne Co, MD 03/01/13 1144

## 2013-03-11 ENCOUNTER — Emergency Department (HOSPITAL_COMMUNITY)
Admission: EM | Admit: 2013-03-11 | Discharge: 2013-03-11 | Disposition: A | Discharge status: Home / Self Care | Payer: Self-pay | Attending: Emergency Medicine | Admitting: Emergency Medicine

## 2013-03-11 ENCOUNTER — Encounter (HOSPITAL_COMMUNITY): Payer: Self-pay | Admitting: Emergency Medicine

## 2013-03-11 DIAGNOSIS — F489 Nonpsychotic mental disorder, unspecified: Secondary | ICD-10-CM | POA: Insufficient documentation

## 2013-03-11 DIAGNOSIS — F1020 Alcohol dependence, uncomplicated: Secondary | ICD-10-CM

## 2013-03-11 DIAGNOSIS — F17200 Nicotine dependence, unspecified, uncomplicated: Secondary | ICD-10-CM | POA: Insufficient documentation

## 2013-03-11 DIAGNOSIS — F3130 Bipolar disorder, current episode depressed, mild or moderate severity, unspecified: Secondary | ICD-10-CM

## 2013-03-11 DIAGNOSIS — R443 Hallucinations, unspecified: Secondary | ICD-10-CM | POA: Insufficient documentation

## 2013-03-11 DIAGNOSIS — R45850 Homicidal ideations: Secondary | ICD-10-CM | POA: Insufficient documentation

## 2013-03-11 DIAGNOSIS — F10229 Alcohol dependence with intoxication, unspecified: Secondary | ICD-10-CM | POA: Insufficient documentation

## 2013-03-11 LAB — COMPREHENSIVE METABOLIC PANEL
ALT: 23 U/L (ref 0–53)
AST: 34 U/L (ref 0–37)
Albumin: 4.1 g/dL (ref 3.5–5.2)
Alkaline Phosphatase: 88 U/L (ref 39–117)
Chloride: 99 mEq/L (ref 96–112)
Creatinine, Ser: 0.65 mg/dL (ref 0.50–1.35)
Potassium: 3.8 mEq/L (ref 3.5–5.1)
Sodium: 135 mEq/L (ref 135–145)
Total Bilirubin: 0.3 mg/dL (ref 0.3–1.2)

## 2013-03-11 LAB — RAPID URINE DRUG SCREEN, HOSP PERFORMED
Amphetamines: NOT DETECTED
Barbiturates: NOT DETECTED
Opiates: NOT DETECTED
Tetrahydrocannabinol: NOT DETECTED

## 2013-03-11 LAB — CBC
MCH: 33.6 pg (ref 26.0–34.0)
Platelets: 338 10*3/uL (ref 150–400)
RBC: 4.05 MIL/uL — ABNORMAL LOW (ref 4.22–5.81)
RDW: 13.2 % (ref 11.5–15.5)
WBC: 12.5 10*3/uL — ABNORMAL HIGH (ref 4.0–10.5)

## 2013-03-11 LAB — ETHANOL: Alcohol, Ethyl (B): 25 mg/dL — ABNORMAL HIGH (ref 0–11)

## 2013-03-11 MED ORDER — LORAZEPAM 1 MG PO TABS
0.0000 mg | ORAL_TABLET | Freq: Four times a day (QID) | ORAL | Status: DC
  Administered 2013-03-11: 1 mg via ORAL
  Filled 2013-03-11: qty 1

## 2013-03-11 MED ORDER — LORAZEPAM 1 MG PO TABS: 0.0000 mg | ORAL_TABLET | Freq: Two times a day (BID) | ORAL | Status: DC

## 2013-03-11 NOTE — Progress Notes (Signed)
CSW informed EDP of discharge.   Catha Gosselin, LCSW [phone]  ED CSW .03/11/2013 1328pm

## 2013-03-11 NOTE — Consult Note (Signed)
Pt was interviewed with NP Josephine. Chart reviewed, and case discussed with ED staff. Above assessment reviewed, and I agree with assessment and plan. Pt denies current SI/HI/AVH. Pt refuses inpatient detox at this time, because he is currently not motivated to stop drinking. He wants to be discharged, so he can start drinking again. He also wants to go to the church tomorrow to get some clothes. Will discharge pt to home. Pt does not appear to be a threat to himself or others at this time.

## 2013-03-11 NOTE — BH Assessment (Signed)
Assessment Note  Jimmy [name] is a 57 y.o. male who was brought in by the police dept after being called to a local [employer] wherer pt was found threatening patrons with bodily harm and threatening to harm self.  Pt says he went to establishment to make a phone call. Pt has no plan or intent and was intoxicated at the time these threats were expressed.  Pt endorses auditory and visual halluc w/o command to harm self or others, unable to provide specifics.  This Clinical research associate is unsure if aud/visual halluc are substance induced or related to bipolar d/o.  Pt reports drinking 1 case of alcohol, daily, last drink was 03/11/13 and pt consumed 1 case.  Pt also told medical staff that he takes 6-7 pills , daily, unk last use or what specific pills he is using; he did not report any activity to this Clinical research associate.  Pt is homless and has been drinking since age 88.  Pt denies any current w/d sxs and denies SI/HI.  During the interview with this Clinical research associate, pt is clearly intoxicated with slurred/soft speech, poor focus and drowsiness, however managed to engage in the interview.  Pt has past inpt admissions with [facility] and [facility] for detox and bipolar d/o, pt not currently on any medications.     Axis I: Bipolar D/O; Alcohol Dependence  Axis II: Deferred Axis III:  Past Medical History  Diagnosis Date  . Alcohol abuse   . Schizophrenia   . Bipolar 1 disorder   . Mental disorder    Axis IV: economic problems, housing problems, other psychosocial or environmental problems, problems related to social environment and problems with primary support group Axis V: 41-50 serious symptoms  Past Medical History:  Past Medical History  Diagnosis Date  . Alcohol abuse   . Schizophrenia   . Bipolar 1 disorder   . Mental disorder     Past Surgical History  Procedure Laterality Date  . Skin graft      Family History: History reviewed. No pertinent family history.  Social History:  reports that he has been smoking  Cigarettes.  He has a 60 pack-year smoking history. He does not have any smokeless tobacco history on file. He reports that he drinks about 12.6 ounces of alcohol per week. He reports that he uses illicit drugs (Cocaine and Marijuana).  Additional Social History:  Alcohol / Drug Use Pain Medications: None  Prescriptions: None  Over the Counter: None  History of alcohol / drug use?: Yes Longest period of sobriety (when/how long): Only when in detox  Negative Consequences of Use: Work / School;Personal relationships;Financial Withdrawal Symptoms: Other (Comment) (No current w/d sxs ) Substance #1 Name of Substance 1: Alcohol  1 - Age of First Use: 15 YOM  1 - Amount (size/oz): 1 Case  1 - Frequency: Daily  1 - Duration: On-going  1 - Last Use / Amount: 03/10/13 Substance #2 Name of Substance 2: Pills  2 - Age of First Use: Unk  2 - Amount (size/oz): 6-7 pills  2 - Frequency: Daily  2 - Duration: On-going  2 - Last Use / Amount: Unk   CIWA: CIWA-Ar BP: 113/71 mmHg Pulse Rate: 88 COWS:    Allergies: No Known Allergies  Home Medications:  (Not in a hospital admission)  OB/GYN Status:  No LMP for male patient.  General Assessment Data Location of Assessment: [facility] Is this a Tele or Face-to-Face Assessment?: Tele Assessment Is this an Initial Assessment or  a Re-assessment for this encounter?: Initial Assessment Living Arrangements: Other (Comment) (Homeless ) Can pt return to current living arrangement?: Yes Admission Status: Voluntary Is patient capable of signing voluntary admission?: Yes Transfer from: Acute Hospital Referral Source: MD  Medical Screening Exam [facility] Walk-in ONLY) Medical Exam completed: No Reason for MSE not completed: Other: (None )  [facility] Crisis Care Plan Living Arrangements: Other (Comment) (Homeless ) Name of Psychiatrist: None  Name of Therapist: None   Education Status Is patient currently in school?: No Current Grade: None  Highest grade of  school patient has completed: 8 Name of school: None  Contact person: None   Risk to self Suicidal Ideation: No Suicidal Intent: No Is patient at risk for suicide?: No Suicidal Plan?: No Access to Means: No What has been your use of drugs/alcohol within the last 12 months?: Abusing alcohol and per nursing notes, pills(unk specifics)  Previous Attempts/Gestures: Yes How many times?: 1 Other Self Harm Risks: None  Triggers for Past Attempts: Unpredictable Intentional Self Injurious Behavior: None Family Suicide History: Yes (Grandfather committed SI ) Recent stressful life event(s): Other (Comment);Financial Problems (Homeless ) Persecutory voices/beliefs?: No Depression: Yes Depression Symptoms: Loss of interest in usual pleasures;Feeling worthless/self pity Substance abuse history and/or treatment for substance abuse?: Yes Suicide prevention information given to non-admitted patients: Not applicable  Risk to Others Homicidal Ideation: No Thoughts of Harm to Others: No Current Homicidal Intent: No Current Homicidal Plan: No Access to Homicidal Means: No Identified Victim: None  History of harm to others?: No Assessment of Violence: None Noted Violent Behavior Description: None  Does patient have access to weapons?: No Criminal Charges Pending?: No Does patient have a court date: No  Psychosis Hallucinations: Auditory;Visual Delusions: None noted  Mental Status Report Appear/Hygiene: Disheveled;Poor hygiene Eye Contact: Poor Motor Activity: Unremarkable Speech: Slurred;Slow;Soft;Logical/coherent Level of Consciousness: Drowsy;Irritable Mood: Irritable Affect: Irritable Anxiety Level: None Thought Processes: Relevant Judgement: Unimpaired Orientation: Person;Place;Time;Situation Obsessive Compulsive Thoughts/Behaviors: None  Cognitive Functioning Concentration: Decreased Memory: Recent Intact;Remote Intact IQ: Average Insight: Fair Impulse Control:  Fair Appetite: Fair Weight Loss: 0 Weight Gain: 0 Sleep: No Change Total Hours of Sleep: 5 Vegetative Symptoms: None  ADLScreening [facility] Assessment Services) Patient's cognitive ability adequate to safely complete daily activities?: Yes Patient able to express need for assistance with ADLs?: Yes Independently performs ADLs?: Yes (appropriate for developmental age)  Prior Inpatient Therapy Prior Inpatient Therapy: Yes Prior Therapy Dates: 2013, 2006 Prior Therapy Facilty/Provider(s[place] [facility] Reason for Treatment: Detox, MH   Prior Outpatient Therapy Prior Outpatient Therapy: Yes Prior Therapy Dates: 2007 Prior Therapy Facilty/Provider(s): [facility] Reason for Treatment: bipolar disorder  ADL Screening (condition at time of admission) Patient's cognitive ability adequate to safely complete daily activities?: Yes Is the patient deaf or have difficulty hearing?: No Does the patient have difficulty seeing, even when wearing glasses/contacts?: No Does the patient have difficulty concentrating, remembering, or making decisions?: No Patient able to express need for assistance with ADLs?: Yes Does the patient have difficulty dressing or bathing?: No Independently performs ADLs?: Yes (appropriate for developmental age) Does the patient have difficulty walking or climbing stairs?: No Weakness of Legs: None Weakness of Arms/Hands: None  Home Assistive Devices/Equipment Home Assistive Devices/Equipment: None  Therapy Consults (therapy consults require a physician order) PT Evaluation Needed: No OT Evalulation Needed: No SLP Evaluation Needed: No Abuse/Neglect Assessment (Assessment to be complete while patient is alone) Physical Abuse: Denies Verbal Abuse: Denies Sexual Abuse: Denies Exploitation of patient/patient's  resources: Denies Self-Neglect: Denies Values / Beliefs Cultural Requests During Hospitalization:  None Spiritual Requests During Hospitalization: None Consults Spiritual Care Consult Needed: No Social Work Consult Needed: No Merchant navy officer (For Healthcare) Advance Directive: Patient does not have advance directive;Patient would not like information Pre-existing out of facility DNR order (yellow form or pink MOST form): No Nutrition Screen- [facility] Patient's home diet: Regular  Additional Information 1:1 In Past 12 Months?: No CIRT Risk: No Elopement Risk: No Does patient have medical clearance?: Yes     Disposition:  Disposition Initial Assessment Completed for this Encounter: Yes Disposition of Patient: Inpatient treatment program;Referred to ([facility] ) Type of inpatient treatment program: Adult Patient referred to: [facility]  On Site Evaluation by:   Reviewed with Physician:    Murrell Redden 03/11/2013 5:37 AM

## 2013-03-11 NOTE — ED Notes (Signed)
[facility]S assessment attempted by Terri.

## 2013-03-11 NOTE — ED Notes (Signed)
Pt was brought in by [employer], pt is intoxicated and it was reported that he went into [employer] and threatened to hurt hisself and others

## 2013-03-11 NOTE — Consult Note (Signed)
[facility] Face-to-Face Psychiatry Consult   Reason for Consult: Alcohol dependence Referring Physician:  EDP Jimmy [name] is an 57 y.o. male.  Assessment: AXIS I:  Bipolar, Depressed and Alcohol dependence, AXIS II:  Deferred AXIS III:   Past Medical History  Diagnosis Date  . Alcohol abuse   . Schizophrenia   . Bipolar 1 disorder   . Mental disorder    AXIS IV:  educational problems, housing problems, occupational problems, other psychosocial or environmental problems, problems related to social environment and problems with primary support group AXIS V:  61-70 mild symptoms  Plan:  No evidence of imminent risk to self or others at present.    Subjective:   Jimmy [name] is a 57 y.o. male patient is evaluated for alcoholism/intoxication.Marland Kitchen  HPI:  Caucasian male 21 was evaluated this am with Dr Jimmy [name].   This is one of his several visits to our ER and patient had one admission last year for alcohol detoxification.  Patient states he was brought in last night by the police for alcohol intoxication. His BAL on arrival  Was 258.   He had  Slurred speech on arrival to the ER.   Patient states that he "drinks a lot of alcohol"  He started drinking at age 61, he was sober during the 5 times period he was in prison.    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  He also has a history of Bipolar d/o and takes his medications off and on.  He last took his medications last year.  He denies suicidal thoughts this time but has a history suicide attempt in the past by cutting his leg.    He also reports a withdrawal seizure back in 2006.  He has mild tremors of his hands  But denies any other symptoms.  He denies SI/HI/AVH.  Patient states he feel "great" this am and is asking to be discharged.  He declined offer for alcohol detox because he must be at a church giving out sleeping bags and other personal needs to homeless people tomorrow.  Patient will be discharged home today with  outpatient referrals.  HPI Elements:   Location:  [facility]. Quality:  MINOR.  Past Psychiatric History: Past Medical History  Diagnosis Date  . Alcohol abuse   . Schizophrenia   . Bipolar 1 disorder   . Mental disorder     reports that he has been smoking Cigarettes.  He has a 60 pack-year smoking history. He does not have any smokeless tobacco history on file. He reports that he drinks about 12.6 ounces of alcohol per week. He reports that he uses illicit drugs (Cocaine and Marijuana). History reviewed. No pertinent family history. Family History Substance Abuse: No Family Supports: No Living Arrangements: Other (Comment) (Homeless ) Can pt return to current living arrangement?: Yes Abuse/Neglect [facility]) Physical Abuse: Denies Verbal Abuse: Denies Sexual Abuse: Denies Allergies:  No Known Allergies  ACT Assessment Complete:  Yes:    Educational Status    Risk to Self: Risk to self Suicidal Ideation: No Suicidal Intent: No Is patient at risk for suicide?: No Suicidal Plan?: No Access to Means: No What has been your use of drugs/alcohol within the last 12 months?: Abusing alcohol and per nursing notes, pills(unk specifics)  Previous Attempts/Gestures: Yes How many times?: 1 Other Self Harm Risks: None  Triggers for Past Attempts: Unpredictable Intentional Self Injurious Behavior: None Family Suicide History: Yes (Grandfather committed SI )  Recent stressful life event(s): Other (Comment);Financial Problems (Homeless ) Persecutory voices/beliefs?: No Depression: Yes Depression Symptoms: Loss of interest in usual pleasures;Feeling worthless/self pity Substance abuse history and/or treatment for substance abuse?: Yes Suicide prevention information given to non-admitted patients: Not applicable  Risk to Others: Risk to Others Homicidal Ideation: No Thoughts of Harm to Others: No Current Homicidal Intent: No Current Homicidal Plan: No Access to Homicidal Means: No Identified  Victim: None  History of harm to others?: No Assessment of Violence: None Noted Violent Behavior Description: None  Does patient have access to weapons?: No Criminal Charges Pending?: No Does patient have a court date: No  Abuse: Abuse/Neglect Assessment (Assessment to be complete while patient is alone) Physical Abuse: Denies Verbal Abuse: Denies Sexual Abuse: Denies Exploitation of patient/patient's resources: Denies Self-Neglect: Denies  Prior Inpatient Therapy: Prior Inpatient Therapy Prior Inpatient Therapy: Yes Prior Therapy Dates: 2013, 2006 Prior Therapy Facilty/Provider(s): [facility] [place], [place] Reason for Treatment: Detox, MH   Prior Outpatient Therapy: Prior Outpatient Therapy Prior Outpatient Therapy: Yes Prior Therapy Dates: 2007 Prior Therapy Facilty/Provider(s): [facility] Reason for Treatment: bipolar disorder  Additional Information: Additional Information 1:1 In Past 12 Months?: No CIRT Risk: No Elopement Risk: No Does patient have medical clearance?: Yes                  Objective: Blood pressure 122/73, pulse 106, temperature 98.4 F (36.9 C), temperature source Oral, resp. rate 14, SpO2 95.00%.There is no weight on file to calculate BMI. Results for orders placed during the hospital encounter of 03/11/13 (from the past 72 hour(s))  CBC     Status: Abnormal   Collection Time    03/11/13  2:19 AM      Result Value Range   WBC 12.5 (*) 4.0 - 10.5 K/uL   RBC 4.05 (*) 4.22 - 5.81 MIL/uL   Hemoglobin 13.6  13.0 - 17.0 g/dL   HCT 69.6 (*) 29.5 - 28.4 %   MCV 96.0  78.0 - 100.0 fL   MCH 33.6  26.0 - 34.0 pg   MCHC 35.0  30.0 - 36.0 g/dL   RDW 13.2  44.0 - 10.2 %   Platelets 338  150 - 400 K/uL  COMPREHENSIVE METABOLIC PANEL     Status: Abnormal   Collection Time    03/11/13  2:19 AM      Result Value Range   Sodium 135  135 - 145 mEq/L   Potassium 3.8  3.5 - 5.1 mEq/L   Chloride 99  96 - 112  mEq/L   CO2 24  19 - 32 mEq/L   Glucose, Bld 100 (*) 70 - 99 mg/dL   BUN 5 (*) 6 - 23 mg/dL   Creatinine, Ser 7.25  0.50 - 1.35 mg/dL   Calcium 9.1  8.4 - 36.6 mg/dL   Total Protein 8.3  6.0 - 8.3 g/dL   Albumin 4.1  3.5 - 5.2 g/dL   AST 34  0 - 37 U/L   ALT 23  0 - 53 U/L   Alkaline Phosphatase 88  39 - 117 U/L   Total Bilirubin 0.3  0.3 - 1.2 mg/dL   GFR calc non Af Amer >90  >90 mL/min   GFR calc Af Amer >90  >90 mL/min   Comment: (NOTE)     The eGFR has been calculated using the CKD EPI equation.     This calculation has not been validated in all clinical situations.  eGFR's persistently <90 mL/min signify possible Chronic Kidney     Disease.  ETHANOL     Status: Abnormal   Collection Time    03/11/13  2:19 AM      Result Value Range   Alcohol, Ethyl (B) 258 (*) 0 - 11 mg/dL   Comment:            LOWEST DETECTABLE LIMIT FOR     SERUM ALCOHOL IS 11 mg/dL     FOR MEDICAL PURPOSES ONLY  URINE RAPID DRUG SCREEN (HOSP PERFORMED)     Status: None   Collection Time    03/11/13  2:38 AM      Result Value Range   Opiates NONE DETECTED  NONE DETECTED   Cocaine NONE DETECTED  NONE DETECTED   Benzodiazepines NONE DETECTED  NONE DETECTED   Amphetamines NONE DETECTED  NONE DETECTED   Tetrahydrocannabinol NONE DETECTED  NONE DETECTED   Barbiturates NONE DETECTED  NONE DETECTED   Comment:            DRUG SCREEN FOR MEDICAL PURPOSES     ONLY.  IF CONFIRMATION IS NEEDED     FOR ANY PURPOSE, NOTIFY LAB     WITHIN 5 DAYS.                LOWEST DETECTABLE LIMITS     FOR URINE DRUG SCREEN     Drug Class       Cutoff (ng/mL)     Amphetamine      1000     Barbiturate      200     Benzodiazepine   200     Tricyclics       300     Opiates          300     Cocaine          300     THC              50  ETHANOL     Status: Abnormal   Collection Time    03/11/13  5:14 AM      Result Value Range   Alcohol, Ethyl (B) 160 (*) 0 - 11 mg/dL   Comment:            LOWEST DETECTABLE  LIMIT FOR     SERUM ALCOHOL IS 11 mg/dL     FOR MEDICAL PURPOSES ONLY  ETHANOL     Status: Abnormal   Collection Time    03/11/13 10:15 AM      Result Value Range   Alcohol, Ethyl (B) 25 (*) 0 - 11 mg/dL   Comment:            LOWEST DETECTABLE LIMIT FOR     SERUM ALCOHOL IS 11 mg/dL     FOR MEDICAL PURPOSES ONLY   Labs are reviewed and are pertinent for Unremarkable, alcohol level dropped from 258 to 25 this am.  Current Facility-Administered Medications  Medication Dose Route Frequency Provider Last Rate Last Dose  . LORazepam (ATIVAN) tablet 0-4 mg  0-4 mg Oral Q6H [name], NP   1 mg at 03/11/13 1149   Followed by  . [START ON 03/13/2013] LORazepam (ATIVAN) tablet 0-4 mg  0-4 mg Oral Q12H [name], NP       No current outpatient prescriptions on file.    Psychiatric Specialty Exam:     Blood pressure 122/73, pulse 106, temperature 98.4 F (36.9 C), temperature  source Oral, resp. rate 14, SpO2 95.00%.There is no weight on file to calculate BMI.  General Appearance: Fairly Groomed  Patent attorney::  Good  Speech:  Clear and Coherent and Normal Rate  Volume:  Normal  Mood:  Depressed and but cooperative and calm  Affect:  Depressed  Thought Process:  Coherent and Goal Directed  Orientation:  Full (Time, Place, and Person)  Thought Content:  NA  Suicidal Thoughts:  No  Homicidal Thoughts:  No  Memory:  Immediate;   Good Recent;   Good Remote;   Good  Judgement:  Poor  Insight:  Present  Psychomotor Activity:  Tremor  Concentration:  Good  Recall:  NA  Akathisia:  NA  Handed:  Right  AIMS (if indicated):     Assets:  Desire for Improvement Housing  Sleep:      Treatment Plan Summary:  Consult and face to face interview with Dr Jimmy [name] Patient will be discharged today. Evaluated this am and found safe to go home.  He is not a danger to himself or anybody.  Dahlia Byes, C   PMHNP-BC 03/11/2013 12:13 PM

## 2013-03-11 NOTE — ED Notes (Signed)
Pt transferred from triage, presents for alcohol detox.  Pt intoxicated with SI, no specific plan.  Denies HI. Admits to AV hallucinations, states he is seeing & hearing things that are not there, but will not elaborate further.  Pt reports he is homeless and drinks as many cans of beer that he can get his hands on. Pt cooperative but irritated at present.  Poor hygiene noted.

## 2013-03-11 NOTE — ED Notes (Signed)
Pt belongings consist of green coat, red hoodie, black tshirt, blue scarf, hat, sneakers, brown belt.Marland KitchenMarland KitchenMarland Kitchen

## 2013-03-11 NOTE — Progress Notes (Signed)
The following facilities have been contacted on pt's behalf regarding inpatient treatment:  [facility] beds are available and referral has been faxed [facility]- per Marchelle Folks, RN they have availability and asked that referral be faxed, referral faxed [facility]- per [facility] beds are available and referral has been faxed [facility]- referral faxed, beds available  [facility], MHT

## 2013-03-11 NOTE — ED Provider Notes (Signed)
CSN: [id number]     Arrival date & time 03/11/13  0202 History   First MD Initiated Contact with Patient 03/11/13 0304     Chief Complaint  Patient presents with  . Suicidal   (Consider location/radiation/quality/duration/timing/severity/associated sxs/prior Treatment) HPI Comments: By [employer].  He is visibly intoxicated.  He was brought from a diner where he threatened to hurt himself and others.  He was seen last week for command hallucinations and homicidality.  He, states today.  He wants detox from alcohol  The history is provided by the patient.    Past Medical History  Diagnosis Date  . Alcohol abuse   . Schizophrenia   . Bipolar 1 disorder   . Mental disorder    Past Surgical History  Procedure Laterality Date  . Skin graft     History reviewed. No pertinent family history. History  Substance Use Topics  . Smoking status: Current Every Day Smoker -- 2.00 packs/day for 30 years    Types: Cigarettes  . Smokeless tobacco: Not on file  . Alcohol Use: 12.6 oz/week    21 Cans of beer per week     Comment: drinks as much etoh as he can get per day    Review of Systems  Unable to perform ROS Constitutional: Negative for fever.  Psychiatric/Behavioral: Positive for hallucinations and self-injury. Negative for suicidal ideas, behavioral problems and sleep disturbance.  All other systems reviewed and are negative.    Allergies  Review of patient's allergies indicates no known allergies.  Home Medications  No current outpatient prescriptions on file. BP 122/73  Pulse 106  Temp(Src) 98.4 F (36.9 C) (Oral)  Resp 14  SpO2 95% Physical Exam  Nursing note and vitals reviewed. Constitutional: He appears well-developed and well-nourished.  Intoxicated  HENT:  Head: Normocephalic.  Eyes: Pupils are equal, round, and reactive to light.  Cardiovascular: Normal rate and regular rhythm.   Pulmonary/Chest: Effort normal.  Musculoskeletal: Normal range of motion.   Neurological: He is alert.  Skin: Skin is warm.  Psychiatric: Thought content normal. His speech is slurred. He is actively hallucinating. He expresses inappropriate judgment.    ED Course  Procedures (including critical care time) Labs Review Labs Reviewed  CBC - Abnormal; Notable for the following:    WBC 12.5 (*)    RBC 4.05 (*)    HCT 38.9 (*)    All other components within normal limits  COMPREHENSIVE METABOLIC PANEL - Abnormal; Notable for the following:    Glucose, Bld 100 (*)    BUN 5 (*)    All other components within normal limits  ETHANOL - Abnormal; Notable for the following:    Alcohol, Ethyl (B) 258 (*)    All other components within normal limits  ETHANOL - Abnormal; Notable for the following:    Alcohol, Ethyl (B) 160 (*)    All other components within normal limits  ETHANOL - Abnormal; Notable for the following:    Alcohol, Ethyl (B) 25 (*)    All other components within normal limits  URINE RAPID DRUG SCREEN (HOSP PERFORMED)   Imaging Review No results found.  EKG Interpretation   None       MDM   1. Alcohol dependence     1 the patient to the psychiatric area for further evaluation by psychiatry    [name], NP 03/16/13 2016

## 2013-03-11 NOTE — Progress Notes (Signed)
Received phone call back from [place] at [facility], stating that pt has been accepted pending updated ETOH level and having transportation back to [facility] once d/c'd.  Oncoming supervisor will be Jeannett Senior and she stated she will leave the paper work for him and update on pending status.    Tomi Bamberger, MHT

## 2013-03-11 NOTE — Progress Notes (Signed)
Per discussion with psychiatrist, patient refusing treatment. Patient dc pending completion of psychiatrist note.   Catha Gosselin, LCSW [phone]  ED CSW .03/11/2013 1121am

## 2013-03-19 NOTE — ED Provider Notes (Signed)
Medical screening examination/treatment/procedure(s) were performed by non-physician practitioner and as supervising physician I was immediately available for consultation/collaboration.    Vida Roller, MD 03/19/13 [phone]

## 2013-03-24 ENCOUNTER — Encounter (HOSPITAL_COMMUNITY): Payer: Self-pay | Admitting: Emergency Medicine

## 2013-03-24 ENCOUNTER — Emergency Department (HOSPITAL_COMMUNITY)
Admission: EM | Admit: 2013-03-24 | Discharge: 2013-03-25 | Disposition: A | Discharge status: Rehabilitation Facility | Payer: Self-pay | Attending: Emergency Medicine | Admitting: Emergency Medicine

## 2013-03-24 DIAGNOSIS — F1020 Alcohol dependence, uncomplicated: Secondary | ICD-10-CM | POA: Insufficient documentation

## 2013-03-24 DIAGNOSIS — Z8659 Personal history of other mental and behavioral disorders: Secondary | ICD-10-CM | POA: Insufficient documentation

## 2013-03-24 DIAGNOSIS — F1910 Other psychoactive substance abuse, uncomplicated: Secondary | ICD-10-CM | POA: Insufficient documentation

## 2013-03-24 DIAGNOSIS — F17200 Nicotine dependence, unspecified, uncomplicated: Secondary | ICD-10-CM | POA: Insufficient documentation

## 2013-03-24 MED ORDER — LORAZEPAM 1 MG PO TABS: 0.0000 mg | ORAL_TABLET | Freq: Four times a day (QID) | ORAL | Status: DC

## 2013-03-24 MED ORDER — ONDANSETRON HCL 4 MG PO TABS: 4.0000 mg | ORAL_TABLET | Freq: Three times a day (TID) | ORAL | Status: DC | PRN

## 2013-03-24 MED ORDER — IBUPROFEN 200 MG PO TABS: 600.0000 mg | ORAL_TABLET | Freq: Three times a day (TID) | ORAL | Status: DC | PRN

## 2013-03-24 MED ORDER — THIAMINE HCL 100 MG/ML IJ SOLN: 100.0000 mg | Freq: Every day | INTRAMUSCULAR | Status: DC

## 2013-03-24 MED ORDER — ALUM & MAG HYDROXIDE-SIMETH 200-200-20 MG/5ML PO SUSP: 30.0000 mL | ORAL | Status: DC | PRN

## 2013-03-24 MED ORDER — NICOTINE 21 MG/24HR TD PT24
21.0000 mg | MEDICATED_PATCH | Freq: Every day | TRANSDERMAL | Status: DC
  Administered 2013-03-25: 21 mg via TRANSDERMAL
  Filled 2013-03-24: qty 1

## 2013-03-24 MED ORDER — VITAMIN B-1 100 MG PO TABS
100.0000 mg | ORAL_TABLET | Freq: Every day | ORAL | Status: DC
  Administered 2013-03-25: 100 mg via ORAL
  Filled 2013-03-24: qty 1

## 2013-03-24 MED ORDER — LORAZEPAM 1 MG PO TABS: 0.0000 mg | ORAL_TABLET | Freq: Two times a day (BID) | ORAL | Status: DC

## 2013-03-24 NOTE — ED Notes (Signed)
Pt arrived to the Ed stating he needs detox.  [facility] because [place] on the [place].  [facility] but left because he couldn't have cigarettes.

## 2013-03-24 NOTE — ED Provider Notes (Signed)
CSN: [id number]     Arrival date & time 03/24/13  2222 History   First MD Initiated Contact with Patient 03/24/13 2339     Chief Complaint  Patient presents with  . Medical Clearance   (Consider location/radiation/quality/duration/timing/severity/associated sxs/prior Treatment) HPI History provided by pt.   Pt presents w/ request for alcohol detox.  Drinks as much as he can get and uses whatever drug he can get, as often as possible, every single day.  When initially asked why he decided to get help today, he said "because it's cold outside".  When I asked him if he came for a place to stay, he said that both he and his brother decided to get treatment for alcoholism so that they can turn their lives around.  He denies SI/HI.  Per prior chart, most recently admitted for alcohol detox in 2013.   Past Medical History  Diagnosis Date  . Alcohol abuse   . Schizophrenia   . Bipolar 1 disorder   . Mental disorder    Past Surgical History  Procedure Laterality Date  . Skin graft     History reviewed. No pertinent family history. History  Substance Use Topics  . Smoking status: Current Every Day Smoker -- 2.00 packs/day for 30 years    Types: Cigarettes  . Smokeless tobacco: Not on file  . Alcohol Use: 12.6 oz/week    21 Cans of beer per week     Comment: drinks as much etoh as he can get per day    Review of Systems  All other systems reviewed and are negative.    Allergies  Review of patient's allergies indicates no known allergies.  Home Medications  No current outpatient prescriptions on file. BP 117/59  Pulse 97  Temp(Src) 98.7 F (37.1 C) (Oral)  Resp 16  SpO2 96% Physical Exam  Nursing note and vitals reviewed. Constitutional: He is oriented to person, place, and time. He appears well-developed and well-nourished. No distress.  Poor hygiene  HENT:  Head: Normocephalic and atraumatic.  Eyes:  Normal appearance  Neck: Normal range of motion.  Cardiovascular:  Normal rate and regular rhythm.   Pulmonary/Chest: Effort normal and breath sounds normal. No respiratory distress.  Musculoskeletal: Normal range of motion.  Neurological: He is alert and oriented to person, place, and time.  No tremors  Skin: Skin is warm and dry. No rash noted.  Psychiatric: He has a normal mood and affect. His behavior is normal.    ED Course  Procedures (including critical care time) Labs Review Labs Reviewed - No data to display Imaging Review No results found.  EKG Interpretation   None       MDM   1. Polysubstance abuse    57yo alcoholic M w/ schizophrenia and bipolar disorder presents w/ request for alcohol detox.  Labs pending.  Holding orders written.  I suspect that this patient is here to get out of the cold and is not serious about treatment for alcoholism.  12:02 AM   Medically clear.    Otilio Miuatherine E Marquavion Venhuizen, PA-C 03/25/13 [phone]

## 2013-03-25 DIAGNOSIS — F1910 Other psychoactive substance abuse, uncomplicated: Secondary | ICD-10-CM

## 2013-03-25 DIAGNOSIS — F1020 Alcohol dependence, uncomplicated: Secondary | ICD-10-CM

## 2013-03-25 LAB — CBC WITH DIFFERENTIAL/PLATELET
BASOS ABS: 0.1 10*3/uL (ref 0.0–0.1)
BASOS PCT: 1 % (ref 0–1)
Eosinophils Absolute: 1.4 10*3/uL — ABNORMAL HIGH (ref 0.0–0.7)
Eosinophils Relative: 14 % — ABNORMAL HIGH (ref 0–5)
HCT: 38.9 % — ABNORMAL LOW (ref 39.0–52.0)
Hemoglobin: 13.5 g/dL (ref 13.0–17.0)
Lymphocytes Relative: 27 % (ref 12–46)
Lymphs Abs: 2.7 10*3/uL (ref 0.7–4.0)
MCH: 33.5 pg (ref 26.0–34.0)
MCHC: 34.7 g/dL (ref 30.0–36.0)
MCV: 96.5 fL (ref 78.0–100.0)
Monocytes Absolute: 0.8 10*3/uL (ref 0.1–1.0)
Monocytes Relative: 8 % (ref 3–12)
NEUTROS ABS: 5.1 10*3/uL (ref 1.7–7.7)
Neutrophils Relative %: 50 % (ref 43–77)
PLATELETS: 314 10*3/uL (ref 150–400)
RBC: 4.03 MIL/uL — ABNORMAL LOW (ref 4.22–5.81)
RDW: 13.4 % (ref 11.5–15.5)
WBC: 10.2 10*3/uL (ref 4.0–10.5)

## 2013-03-25 LAB — BASIC METABOLIC PANEL
BUN: 7 mg/dL (ref 6–23)
CHLORIDE: 96 meq/L (ref 96–112)
CO2: 23 mEq/L (ref 19–32)
Calcium: 9.1 mg/dL (ref 8.4–10.5)
Creatinine, Ser: 0.74 mg/dL (ref 0.50–1.35)
GFR calc Af Amer: >90 mL/min (ref >90)
GFR calc non Af Amer: >90 mL/min (ref >90)
Glucose, Bld: 83 mg/dL (ref 70–99)
Potassium: 4.3 mEq/L (ref 3.7–5.3)
Sodium: 136 mEq/L — ABNORMAL LOW (ref 137–147)

## 2013-03-25 LAB — RAPID URINE DRUG SCREEN, HOSP PERFORMED
Amphetamines: NOT DETECTED
Barbiturates: NOT DETECTED
Benzodiazepines: NOT DETECTED
COCAINE: NOT DETECTED
Opiates: NOT DETECTED
Tetrahydrocannabinol: NOT DETECTED

## 2013-03-25 LAB — ETHANOL: Alcohol, Ethyl (B): 127 mg/dL — ABNORMAL HIGH (ref 0–11)

## 2013-03-25 MED ORDER — CLOTRIMAZOLE-BETAMETHASONE 1-0.05 % EX CREA
TOPICAL_CREAM | Freq: Two times a day (BID) | CUTANEOUS | Status: DC
  Administered 2013-03-25: 12:00:00 via TOPICAL
  Filled 2013-03-25: qty 15

## 2013-03-25 NOTE — BH Assessment (Signed)
Pt requesting detox. Confirmed that beds are available with [facility]. [facility]  Information faxed to the facility. Writer contacted staff-Sherry to confirm that faxed referral was received. Staff will call with updates once patient's clinicals, labs, demographics, etc. are all reviewed.  Additionally pt referred to [facility]. Information faxed to the facility. Writer contacted staff  to confirm that faxed referral was received. Staff will call with updates once patient's clinicals, labs, demographics, etc. are all reviewed.

## 2013-03-25 NOTE — ED Notes (Signed)
Patient arrived to unit for alcohol detox. Pt states "Me and my brother both came in to stay someplace together. Where is he now?" Pt informed that no information could be given to pt about anyone in ER. Pt admitted to leaving several times in the past after [facility], due to wanting to smoke or having other things he had to attend to. Pt asking how long he can stay across the street inpatient. Pt informed that staff is unable to determine at this time. Pt denies SI/HI or a/v hallucinations. No distress noted.

## 2013-03-25 NOTE — Progress Notes (Signed)
Spoke with Thelma BargeFrancis RN concerning the need for patient's lab to be resulted before an accurate assessment for detox can be completed.

## 2013-03-25 NOTE — BH Assessment (Signed)
Assessment Note  Jimmy [name] is an 58 y.o. male requesting detox from alcohol. Patient states that he drinks (5) 40oz beers daily since the age of 86. He denies any self harm thoughts or any thoughts of wanting to harm others; denies AVH. States that his longest period of sobriety was 5.5 years while incarcerated in 2008.    Patient states that the drop in temperature outside is a motivator for his desire to detox at this time and he would like to attend a 28 day treatment facility if possible.  .  Axis I: Alcohol Dependence Axis II: Deferred Axis III:  Past Medical History  Diagnosis Date  . Alcohol abuse   . Schizophrenia   . Bipolar 1 disorder   . Mental disorder    Axis IV: economic problems, housing problems, occupational problems, other psychosocial or environmental problems, problems related to social environment and problems with primary support group Axis V: 40  Past Medical History:  Past Medical History  Diagnosis Date  . Alcohol abuse   . Schizophrenia   . Bipolar 1 disorder   . Mental disorder     Past Surgical History  Procedure Laterality Date  . Skin graft      Family History: History reviewed. No pertinent family history.  Social History:  reports that he has been smoking Cigarettes.  He has a 60 pack-year smoking history. He does not have any smokeless tobacco history on file. He reports that he drinks about 12.6 ounces of alcohol per week. He reports that he uses illicit drugs (Cocaine and Marijuana).  Additional Social History:  Alcohol / Drug Use Pain Medications: None  Prescriptions: None  Over the Counter: None  History of alcohol / drug use?: Yes Longest period of sobriety (when/how long): 5.5 years while incarcerated Negative Consequences of Use: Financial;Legal;Personal relationships;Work / Programmer, multimedia Withdrawal Symptoms: Blackouts;Irritability;Tremors Substance #1 Name of Substance 1: Acohol 1 - Age of First Use: 58 yo 1 - Amount (size/oz):  (5) 40 oz beers 1 - Frequency: Daily 1 - Duration: since age 13 1 - Last Use / Amount: tonight/ (5) 40 oz beers  CIWA: CIWA-Ar BP: 117/59 mmHg Pulse Rate: 97 Nausea and Vomiting: no nausea and no vomiting Tactile Disturbances: none Tremor: two Auditory Disturbances: not present Paroxysmal Sweats: no sweat visible Visual Disturbances: not present Anxiety: no anxiety, at ease Headache, Fullness in Head: none present Agitation: normal activity Orientation and Clouding of Sensorium: oriented and can do serial additions CIWA-Ar Total: 2 COWS:    Allergies: No Known Allergies  Home Medications:  (Not in a hospital admission)  OB/GYN Status:  No LMP for male patient.  General Assessment Data Location of Assessment: [facility] Is this a Tele or Face-to-Face Assessment?: Face-to-Face Is this an Initial Assessment or a Re-assessment for this encounter?: Initial Assessment Living Arrangements: Other (Comment) (Homeless) Can pt return to current living arrangement?: Yes Admission Status: Voluntary Is patient capable of signing voluntary admission?: Yes Transfer from: Home Referral Source: MD  Medical Screening Exam [facility] Walk-in ONLY) Medical Exam completed: Yes  [facility] Crisis Care Plan Living Arrangements: Other (Comment) (Homeless) Name of Psychiatrist: None  Name of Therapist: None   Education Status Is patient currently in school?: No Current Grade: Na Highest grade of school patient has completed: Na Name of school: Na Contact person: None   Risk to self Suicidal Ideation: No Suicidal Intent: No Is patient at risk for suicide?: No Suicidal Plan?: No Access to Means: No What has been  your use of drugs/alcohol within the last 12 months?: Daily Previous Attempts/Gestures: No How many times?:  (None noted) Other Self Harm Risks:  (None noted) Triggers for Past Attempts: Unpredictable Intentional Self Injurious Behavior: None Family Suicide History:  (GF committed  suicide, sisters (bipolar & schizophrenic)) Recent stressful life event(s): Other (Comment) ("It's cold outside") Persecutory voices/beliefs?: No Depression: No Depression Symptoms:  (None noted) Substance abuse history and/or treatment for substance abuse?: Yes Suicide prevention information given to non-admitted patients: Not applicable  Risk to Others Homicidal Ideation: No Thoughts of Harm to Others: No Current Homicidal Intent: No Current Homicidal Plan: No Access to Homicidal Means: No Identified Victim: Na History of harm to others?: No Assessment of Violence: None Noted Violent Behavior Description: Na Does patient have access to weapons?: No Criminal Charges Pending?: No Does patient have a court date: No  Psychosis Hallucinations: None noted Delusions: None noted  Mental Status Report Appear/Hygiene: Disheveled;Poor hygiene Eye Contact: Good Motor Activity: Freedom of movement;Unremarkable Speech: Logical/coherent Level of Consciousness: Alert Mood: Other (Comment) (Attentive cal and cooperative) Affect: Appropriate to circumstance Anxiety Level: None Thought Processes: Coherent;Relevant Judgement: Unimpaired Orientation: Person;Place;Time;Situation Obsessive Compulsive Thoughts/Behaviors: None  Cognitive Functioning Concentration: Normal Memory: Recent Intact;Remote Intact IQ: Average Insight: Fair Impulse Control: Fair Appetite: Poor Weight Loss:  (None noted) Weight Gain:  (None noted) Sleep: No Change Total Hours of Sleep:  (4 hours/ night) Vegetative Symptoms: None  ADLScreening [facility]) Patient's cognitive ability adequate to safely complete daily activities?: Yes Patient able to express need for assistance with ADLs?: Yes Independently performs ADLs?: Yes (appropriate for developmental age)  Prior Inpatient Therapy Prior Inpatient Therapy: Yes Prior Therapy Dates: 2013, 2006 Prior Therapy Facilty/Provider([place] [facility], [place] Reason for Treatment: Detox, MH   Prior Outpatient Therapy Prior Outpatient Therapy: Yes Prior Therapy Dates: 2007 Prior Therapy Facilty/Provider(s): [facility] Reason for Treatment: bipolar disorder  ADL Screening (condition at time of admission) Patient's cognitive ability adequate to safely complete daily activities?: Yes Is the patient deaf or have difficulty hearing?: No Does the patient have difficulty seeing, even when wearing glasses/contacts?: No Does the patient have difficulty concentrating, remembering, or making decisions?: No Patient able to express need for assistance with ADLs?: Yes Does the patient have difficulty dressing or bathing?: No Independently performs ADLs?: Yes (appropriate for developmental age) Does the patient have difficulty walking or climbing stairs?: No Weakness of Legs: None Weakness of Arms/Hands: None  Home Assistive Devices/Equipment Home Assistive Devices/Equipment: None  Therapy Consults (therapy consults require a physician order) PT Evaluation Needed: No OT Evalulation Needed: No SLP Evaluation Needed: No Abuse/Neglect Assessment (Assessment to be complete while patient is alone) Physical Abuse: Denies Verbal Abuse: Denies Sexual Abuse: Denies Exploitation of patient/patient's resources: Denies Self-Neglect: Denies Values / Beliefs Cultural Requests During Hospitalization: None Spiritual Requests During Hospitalization: None Consults Spiritual Care Consult Needed: No Social Work Consult Needed: No Merchant navy officerAdvance Directives (For Healthcare) Advance Directive: Patient would not like information;Patient does not have advance directive    Additional Information 1:1 In Past 12 Months?: No CIRT Risk: No Elopement Risk: No Does patient have medical clearance?: Yes     Disposition:  Disposition Initial Assessment Completed for this Encounter: Yes Disposition of Patient:  Inpatient treatment program;Referred to Type of inpatient treatment program: Adult Patient referred to: [facility]  On Site Evaluation by:   Reviewed with Physician:    Rudi CocoMILLER, Lakima Dona M 03/25/2013 3:25 AM

## 2013-03-25 NOTE — Progress Notes (Signed)
[facility] provided pt with a list of primary care resources, highlighting [facility]. Asked patient if anyone had ever mentioned anything to him about [facility], [facility]. Patient stated that he had heard about it from them before but when he went to try and get one they were not there. Explained to patient that they only do enrollment for the [facility] on certain days of the month and encouraged him to ask about it again when [facility].

## 2013-03-25 NOTE — BH Assessment (Signed)
MHT [facility].  Message and number left for Melissa to return call.  Per Joni ReiningNicole at [facility], pt is denied. She stated that she did not know the reason.  Dossie Arbour-Devonne Kitchen, MA  Disposition [facility]

## 2013-03-25 NOTE — Progress Notes (Signed)
   CARE MANAGEMENT ED NOTE 03/25/2013  Patient:  Irwin BrakemanSAUNDERS,Ronnel L   Account Number:  [id number]  Date Initiated:  03/25/2013  Documentation initiated by:  Edd ArbourGIBBS,Christol [place]  Subjective/Objective Assessment:   58 yr old male [place] self pay [place]/0 admissions in last 6 months etoh 127 NA 136 UDS negative medical clearance for detox     Subjective/Objective Assessment Detail:     Action/Plan:   CM spoke with pt CM spoke with pt who [place]. CM discussed and provided written information for self pay pcps, importance of pcp for f/u care, www.needymeds.org, discounted pharmacies   Action/Plan Detail:   & other [place] resources such as financial assistance, DSS &  health department  Reviewed resources for [place] [facility] like [facility], [facility] [address], [facility], general medical clinics   Anticipated DC Date:       Status Recommendation to Physician:   Result of Recommendation:    Other ED Services  Consult Working Psychologist, educationallan    DC Planning Services  Other  PCP issues  Outpatient Services - Pt will follow up    Choice offered to / List presented to:            Status of service:  Completed, signed off  ED Comments:  Informed of  [facility] urgent [facility] plus others, [facility] out patient pharmacies and housing Pt voiced understanding and appreciation of resources provided  ED Comments Detail:

## 2013-03-25 NOTE — Consult Note (Signed)
[facility] Face-to-Face Psychiatry Consult   Reason for Consult:  Depression and alcohol use. Referring Physician:  ED Physician  Jimmy [name] is an 58 y.o. male.  Assessment: AXIS I:  Alcohol Abuse, Depressive Disorder NOS, Substance Induced Mood Disorder and Alcohol use disorder, severe AXIS II:  Dependent Personality deferred AXIS III:   Past Medical History  Diagnosis Date  . Alcohol abuse   . Schizophrenia   . Bipolar 1 disorder   . Mental disorder    AXIS IV:  educational problems, housing problems, occupational problems and problems with access to health care services AXIS V:  51-60 moderate symptoms  Plan:  Recommend psychiatric Inpatient admission when medically cleared.  Subjective:   Jimmy [name] is a 58 y.o. male patient admitted with depression, being homeless and wants detox.  HPI:  Admitted for using alochol, mostly beer most of his life. Prior history of requesting admission but ended not willing to go for detox. Says its cold out there i need help. I may otherwise continue to drink alcohol.  Somewhat agitated and showing poor insight of having any long term plans or follow up. Endorses depressed mood, withdrawn, disturbed sleep, energy and apetite for weeks. No clear manic or psychotic symptoms. HPI Elements:   Location:  hospital. Quality:  moderate. Severity:  recurrence.  Past Psychiatric History: Past Medical History  Diagnosis Date  . Alcohol abuse   . Schizophrenia   . Bipolar 1 disorder   . Mental disorder     reports that he has been smoking Cigarettes.  He has a 60 pack-year smoking history. He does not have any smokeless tobacco history on file. He reports that he drinks about 12.6 ounces of alcohol per week. He reports that he uses illicit drugs (Cocaine and Marijuana). History reviewed. No pertinent family history. Family History Substance Abuse: Yes, Describe: Family Supports: Yes, List: (lives homeless with brother) Living Arrangements: Other  (Comment) (Homeless) Can pt return to current living arrangement?: Yes Abuse/Neglect [facility]) Physical Abuse: Denies Verbal Abuse: Denies Sexual Abuse: Denies Allergies:  No Known Allergies  ACT Assessment Complete:  Yes:    Educational Status    Risk to Self: Risk to self Suicidal Ideation: No Suicidal Intent: No Is patient at risk for suicide?: No Suicidal Plan?: No Access to Means: No What has been your use of drugs/alcohol within the last 12 months?: Daily Previous Attempts/Gestures: No How many times?:  (None noted) Other Self Harm Risks:  (None noted) Triggers for Past Attempts: Unpredictable Intentional Self Injurious Behavior: None Family Suicide History:  (GF committed suicide, sisters (bipolar & schizophrenic)) Recent stressful life event(s): Other (Comment) ("It's cold outside") Persecutory voices/beliefs?: No Depression: No Depression Symptoms:  (None noted) Substance abuse history and/or treatment for substance abuse?: Yes Suicide prevention information given to non-admitted patients: Not applicable  Risk to Others: Risk to Others Homicidal Ideation: No Thoughts of Harm to Others: No Current Homicidal Intent: No Current Homicidal Plan: No Access to Homicidal Means: No Identified Victim: Na History of harm to others?: No Assessment of Violence: None Noted Violent Behavior Description: Na Does patient have access to weapons?: No Criminal Charges Pending?: No Does patient have a court date: No  Abuse: Abuse/Neglect Assessment (Assessment to be complete while patient is alone) Physical Abuse: Denies Verbal Abuse: Denies Sexual Abuse: Denies Exploitation of patient/patient's resources: Denies Self-Neglect: Denies  Prior Inpatient Therapy: Prior Inpatient Therapy Prior Inpatient Therapy: Yes Prior Therapy Dates: 2013, 2006 Prior Therapy Facilty/Provider(s[place] [facility], [place]  Reason for Treatment: Detox, MH   Prior  Outpatient Therapy: Prior Outpatient Therapy Prior Outpatient Therapy: Yes Prior Therapy Dates: 2007 Prior Therapy Facilty/Provider(s): [facility] Reason for Treatment: bipolar disorder  Additional Information: Additional Information 1:1 In Past 12 Months?: No CIRT Risk: No Elopement Risk: No Does patient have medical clearance?: Yes                  Objective: Blood pressure 117/59, pulse 97, temperature 98.7 F (37.1 C), temperature source Oral, resp. rate 16, SpO2 96.00%.There is no weight on file to calculate BMI. Results for orders placed during the hospital encounter of 03/24/13 (from the past 72 hour(s))  CBC WITH DIFFERENTIAL     Status: Abnormal   Collection Time    03/25/13 12:01 AM      Result Value Range   WBC 10.2  4.0 - 10.5 K/uL   RBC 4.03 (*) 4.22 - 5.81 MIL/uL   Hemoglobin 13.5  13.0 - 17.0 g/dL   HCT 38.9 (*) 39.0 - 52.0 %   MCV 96.5  78.0 - 100.0 fL   MCH 33.5  26.0 - 34.0 pg   MCHC 34.7  30.0 - 36.0 g/dL   RDW 13.4  11.5 - 15.5 %   Platelets 314  150 - 400 K/uL   Neutrophils Relative % 50  43 - 77 %   Neutro Abs 5.1  1.7 - 7.7 K/uL   Lymphocytes Relative 27  12 - 46 %   Lymphs Abs 2.7  0.7 - 4.0 K/uL   Monocytes Relative 8  3 - 12 %   Monocytes Absolute 0.8  0.1 - 1.0 K/uL   Eosinophils Relative 14 (*) 0 - 5 %   Eosinophils Absolute 1.4 (*) 0.0 - 0.7 K/uL   Basophils Relative 1  0 - 1 %   Basophils Absolute 0.1  0.0 - 0.1 K/uL  BASIC METABOLIC PANEL     Status: Abnormal   Collection Time    03/25/13 12:01 AM      Result Value Range   Sodium 136 (*) 137 - 147 mEq/L   Comment: Please note change in reference range.   Potassium 4.3  3.7 - 5.3 mEq/L   Comment: Please note change in reference range.   Chloride 96  96 - 112 mEq/L   CO2 23  19 - 32 mEq/L   Glucose, Bld 83  70 - 99 mg/dL   BUN 7  6 - 23 mg/dL   Creatinine, Ser 0.74  0.50 - 1.35 mg/dL   Calcium 9.1  8.4 - 10.5 mg/dL   GFR calc non Af Amer >90  >90 mL/min   GFR  calc Af Amer >90  >90 mL/min   Comment: (NOTE)     The eGFR has been calculated using the CKD EPI equation.     This calculation has not been validated in all clinical situations.     eGFR's persistently <90 mL/min signify possible Chronic Kidney     Disease.  ETHANOL     Status: Abnormal   Collection Time    03/25/13 12:01 AM      Result Value Range   Alcohol, Ethyl (B) 127 (*) 0 - 11 mg/dL   Comment:            LOWEST DETECTABLE LIMIT FOR     SERUM ALCOHOL IS 11 mg/dL     FOR MEDICAL PURPOSES ONLY  URINE RAPID DRUG SCREEN (HOSP PERFORMED)     Status:  None   Collection Time    03/25/13 12:56 AM      Result Value Range   Opiates NONE DETECTED  NONE DETECTED   Cocaine NONE DETECTED  NONE DETECTED   Benzodiazepines NONE DETECTED  NONE DETECTED   Amphetamines NONE DETECTED  NONE DETECTED   Tetrahydrocannabinol NONE DETECTED  NONE DETECTED   Barbiturates NONE DETECTED  NONE DETECTED   Comment:            DRUG SCREEN FOR MEDICAL PURPOSES     ONLY.  IF CONFIRMATION IS NEEDED     FOR ANY PURPOSE, NOTIFY LAB     WITHIN 5 DAYS.                LOWEST DETECTABLE LIMITS     FOR URINE DRUG SCREEN     Drug Class       Cutoff (ng/mL)     Amphetamine      1000     Barbiturate      200     Benzodiazepine   322     Tricyclics       025     Opiates          300     Cocaine          300     THC              50   Labs are reviewed and are pertinent for 127 alcohol level.  Current Facility-Administered Medications  Medication Dose Route Frequency Provider Last Rate Last Dose  . alum & mag hydroxide-simeth (MAALOX/MYLANTA) 200-200-20 MG/5ML suspension 30 mL  30 mL Oral PRN Arville Lime Schinlever, PA-C      . ibuprofen (ADVIL,MOTRIN) tablet 600 mg  600 mg Oral Q8H PRN Remer Macho, PA-C      . LORazepam (ATIVAN) tablet 0-4 mg  0-4 mg Oral Q6H Catherine E Schinlever, PA-C       Followed by  . [START ON 03/27/2013] LORazepam (ATIVAN) tablet 0-4 mg  0-4 mg Oral Q12H Catherine E  Schinlever, PA-C      . nicotine (NICODERM CQ - dosed in mg/24 hours) patch 21 mg  21 mg Transdermal Daily Catherine E Schinlever, PA-C      . ondansetron (ZOFRAN) tablet 4 mg  4 mg Oral Q8H PRN Arville Lime Schinlever, PA-C      . thiamine (VITAMIN B-1) tablet 100 mg  100 mg Oral Daily Catherine E Schinlever, PA-C       Or  . thiamine (B-1) injection 100 mg  100 mg Intravenous Daily Remer Macho, PA-C       No current outpatient prescriptions on file.    Psychiatric Specialty Exam:     Blood pressure 117/59, pulse 97, temperature 98.7 F (37.1 C), temperature source Oral, resp. rate 16, SpO2 96.00%.There is no weight on file to calculate BMI.  General Appearance: Disheveled  Eye Contact::  Poor  Speech:  Slow  Volume:  Decreased  Mood:  Dysphoric  Affect:  Constricted  Thought Process:  Linear  Orientation:  Full (Time, Place, and Person)  Thought Content:  Rumination  Suicidal Thoughts:  Yes.  without intent/plan  Homicidal Thoughts:  No  Memory:  Recent;   Poor  Judgement:  Poor  Insight:  Lacking  Psychomotor Activity:  Decreased  Concentration:  Fair  Recall:  Poor  Akathisia:  No  Handed:  Right  AIMS (if indicated):     Assets:  Others:  poor motivation to improve as of now.  Sleep:      Treatment Plan Summary: Daily contact with patient to assess and evaluate symptoms and progress in treatment Medication management Admit for detox. Start detox protocol. Home meds when admitted. Consider ACTT team or long term rehab for discharge planning once inside hospital.  Berman Grainger 03/25/2013 9:57 AM

## 2013-03-25 NOTE — ED Provider Notes (Signed)
Medical screening examination/treatment/procedure(s) were performed by non-physician practitioner and as supervising physician I was immediately available for consultation/collaboration.    Olivia Mackielga M Kenechukwu Eckstein, MD 03/25/13 [phone]

## 2013-03-25 NOTE — BH Assessment (Signed)
Writer sent a referral to [facility] this am. Patient seeking detox. Writer followed up with [facility] regarding patient's disposition. [facility] stated that the charge nurse was in a morning meeting. Once she was available she would review patient's packet sent. Writer will continue to [facility].

## 2013-03-25 NOTE — BH Assessment (Signed)
Per Efraim KaufmannMelissa, [facility] Transportation is expected to pick patient up approx. 1345.

## 2013-03-25 NOTE — BH Assessment (Signed)
[facility] Julieanne CottonJosephine, NP and Fredda HammedAktar, [facility]. Pt needs a bed assignment at this time.Writer notified [facility] that bed assignment is needed. Inetta Fermoina sts she will call back.

## 2013-03-25 NOTE — ED Provider Notes (Signed)
Pt to go to [facility],  Pt given lotrisone to treat ringworm rash to right thigh  Jimmy LennertJoseph L Jeniyah Menor, MD 03/25/13 1019

## 2013-04-07 ENCOUNTER — Emergency Department (HOSPITAL_COMMUNITY)
Admission: EM | Admit: 2013-04-07 | Discharge: 2013-04-08 | Disposition: A | Discharge status: Home / Self Care | Payer: Self-pay | Attending: Emergency Medicine | Admitting: Emergency Medicine

## 2013-04-07 ENCOUNTER — Encounter (HOSPITAL_COMMUNITY): Payer: Self-pay | Admitting: Emergency Medicine

## 2013-04-07 DIAGNOSIS — Z5900 Homelessness unspecified: Secondary | ICD-10-CM | POA: Insufficient documentation

## 2013-04-07 DIAGNOSIS — Z8659 Personal history of other mental and behavioral disorders: Secondary | ICD-10-CM | POA: Insufficient documentation

## 2013-04-07 DIAGNOSIS — F919 Conduct disorder, unspecified: Secondary | ICD-10-CM | POA: Insufficient documentation

## 2013-04-07 DIAGNOSIS — F3289 Other specified depressive episodes: Secondary | ICD-10-CM | POA: Insufficient documentation

## 2013-04-07 DIAGNOSIS — F17200 Nicotine dependence, unspecified, uncomplicated: Secondary | ICD-10-CM | POA: Insufficient documentation

## 2013-04-07 DIAGNOSIS — F329 Major depressive disorder, single episode, unspecified: Secondary | ICD-10-CM | POA: Insufficient documentation

## 2013-04-07 DIAGNOSIS — F1020 Alcohol dependence, uncomplicated: Secondary | ICD-10-CM | POA: Insufficient documentation

## 2013-04-07 DIAGNOSIS — R45851 Suicidal ideations: Secondary | ICD-10-CM | POA: Insufficient documentation

## 2013-04-07 NOTE — ED Notes (Signed)
Pt belongings: chap stick, wallet, pack of cigarettes, prescription papers, blue tooth brush,Latuda 20MG  tablets, lotion, tooth paste, brown cigars, blue lighter, change

## 2013-04-07 NOTE — ED Notes (Signed)
Pt has in belonging bag:  Green winter jacket, blue jeans, black belt, white/blue socks, grey tennis shoes, blue long sleeve shirt, dark red sweater.

## 2013-04-07 NOTE — ED Notes (Signed)
Bed: WTR5 Expected date:  Expected time:  Means of arrival:  Comments: 

## 2013-04-07 NOTE — ED Notes (Signed)
Pt arrived to the ED via [employer]  With a need for medical clearance.  Pt acts intoxicated.  Pt states it is cold outside and he fears for his safety.

## 2013-04-07 NOTE — ED Provider Notes (Signed)
CSN: [id number]     Arrival date & time 04/07/13  2306 History  This chart was scribed for non-physician practitioner working with Loren Raceravid Yelverton, MD by Donne Anonayla Curran, ED Scribe. This patient was seen in room [facility] and the patient's [place] was [place] at [address].    First MD Initiated Contact with Patient 04/07/13 2331     Chief Complaint  Patient presents with  . Medical Clearance    The history is provided by the patient and the police. No language interpreter was used.   HPI Comments: Jimmy [name] is a 58 y.o. male [place], who presents to the Emergency Department needing medical clearance. Patient initially told staff that he wished to have help with his alcohol abuse however he states to me that he does not want to quit drinking. He states it is cold outside and he fears for his safety because he is homeless. He denies SI.  He states he recently and reguarly consumes alcohol and he is not interested in alcohol detox. He denies vomiting. He denies HI. He denies other drug use. He denies DM, and states he is otherwise healthy.    Past Medical History  Diagnosis Date  . Alcohol abuse   . Schizophrenia   . Bipolar 1 disorder   . Mental disorder    Past Surgical History  Procedure Laterality Date  . Skin graft     History reviewed. No pertinent family history. History  Substance Use Topics  . Smoking status: Current Every Day Smoker -- 2.00 packs/day for 30 years    Types: Cigarettes  . Smokeless tobacco: Not on file  . Alcohol Use: 12.6 oz/week    21 Cans of beer per week     Comment: drinks as much etoh as he can get per day    Review of Systems  Gastrointestinal: Negative for vomiting.  Psychiatric/Behavioral: Positive for suicidal ideas and behavioral problems.  All other systems reviewed and are negative.    Allergies  Review of patient's allergies indicates no known allergies.  Home Medications  No current outpatient prescriptions on file.  BP  116/72  Pulse 86  Temp(Src) 98.7 F (37.1 C) (Oral)  Resp 20  SpO2 95%  Physical Exam  Nursing note and vitals reviewed. Constitutional: He appears well-developed and well-nourished. No distress.  HENT:  Head: Normocephalic and atraumatic.  Eyes: Conjunctivae are normal.  Neck: Neck supple. No tracheal deviation present.  Cardiovascular: Normal rate, regular rhythm and normal heart sounds.   Pulmonary/Chest: Effort normal and breath sounds normal. No respiratory distress. He has no wheezes. He has no rales.  Musculoskeletal: Normal range of motion.  Neurological: He is alert.  Skin: Skin is warm and dry.  Psychiatric: His behavior is normal. He exhibits a depressed mood. He expresses suicidal ideation. He expresses no homicidal ideation.    ED Course  Procedures DIAGNOSTIC STUDIES: Oxygen Saturation is 95% on RA, adeuqate by my interpretation.    COORDINATION OF CARE: 11:36 PM Patient evaluated. Discussed treatment plan which includes resting until sober and pt agreed to plan.      MDM   1. Alcohol dependence         Angus Sellereter S Makoa Satz, [place]-C 04/08/13 [phone]

## 2013-04-08 ENCOUNTER — Encounter (HOSPITAL_COMMUNITY): Payer: Self-pay | Admitting: Emergency Medicine

## 2013-04-08 ENCOUNTER — Emergency Department (HOSPITAL_COMMUNITY): Payer: Self-pay

## 2013-04-08 ENCOUNTER — Emergency Department (HOSPITAL_COMMUNITY)
Admission: EM | Admit: 2013-04-08 | Discharge: 2013-04-08 | Disposition: A | Discharge status: Home / Self Care | Payer: Self-pay | Attending: Emergency Medicine | Admitting: Emergency Medicine

## 2013-04-08 DIAGNOSIS — R1110 Vomiting, unspecified: Secondary | ICD-10-CM

## 2013-04-08 DIAGNOSIS — Z8659 Personal history of other mental and behavioral disorders: Secondary | ICD-10-CM | POA: Insufficient documentation

## 2013-04-08 DIAGNOSIS — K2990 Gastroduodenitis, unspecified, without bleeding: Principal | ICD-10-CM

## 2013-04-08 DIAGNOSIS — F17200 Nicotine dependence, unspecified, uncomplicated: Secondary | ICD-10-CM | POA: Insufficient documentation

## 2013-04-08 DIAGNOSIS — J40 Bronchitis, not specified as acute or chronic: Secondary | ICD-10-CM

## 2013-04-08 DIAGNOSIS — K2970 Gastritis, unspecified, without bleeding: Secondary | ICD-10-CM

## 2013-04-08 LAB — CBC WITH DIFFERENTIAL/PLATELET
Basophils Absolute: 0.1 10*3/uL (ref 0.0–0.1)
Basophils Relative: 2 % — ABNORMAL HIGH (ref 0–1)
Eosinophils Absolute: 1.1 10*3/uL — ABNORMAL HIGH (ref 0.0–0.7)
Eosinophils Relative: 16 % — ABNORMAL HIGH (ref 0–5)
HEMATOCRIT: 40 % (ref 39.0–52.0)
HEMOGLOBIN: 13.8 g/dL (ref 13.0–17.0)
LYMPHS PCT: 31 % (ref 12–46)
Lymphs Abs: 2.2 10*3/uL (ref 0.7–4.0)
MCH: 33.1 pg (ref 26.0–34.0)
MCHC: 34.5 g/dL (ref 30.0–36.0)
MCV: 95.9 fL (ref 78.0–100.0)
MONO ABS: 0.5 10*3/uL (ref 0.1–1.0)
MONOS PCT: 7 % (ref 3–12)
NEUTROS ABS: 3.1 10*3/uL (ref 1.7–7.7)
Neutrophils Relative %: 45 % (ref 43–77)
Platelets: 453 10*3/uL — ABNORMAL HIGH (ref 150–400)
RBC: 4.17 MIL/uL — ABNORMAL LOW (ref 4.22–5.81)
RDW: 12.4 % (ref 11.5–15.5)
WBC: 7.1 10*3/uL (ref 4.0–10.5)

## 2013-04-08 LAB — BASIC METABOLIC PANEL
BUN: 12 mg/dL (ref 6–23)
CHLORIDE: 98 meq/L (ref 96–112)
CO2: 22 meq/L (ref 19–32)
CREATININE: 0.75 mg/dL (ref 0.50–1.35)
Calcium: 9.1 mg/dL (ref 8.4–10.5)
GFR calc Af Amer: >90 mL/min (ref >90)
GFR calc non Af Amer: >90 mL/min (ref >90)
Glucose, Bld: 84 mg/dL (ref 70–99)
Potassium: 4 mEq/L (ref 3.7–5.3)
Sodium: 136 mEq/L — ABNORMAL LOW (ref 137–147)

## 2013-04-08 LAB — ETHANOL: Alcohol, Ethyl (B): 108 mg/dL — ABNORMAL HIGH (ref 0–11)

## 2013-04-08 MED ORDER — SODIUM CHLORIDE 0.9 % IV BOLUS (SEPSIS)
1000.0000 mL | Freq: Once | INTRAVENOUS | Status: AC
  Administered 2013-04-08: 1000 mL via INTRAVENOUS

## 2013-04-08 MED ORDER — ALBUTEROL SULFATE (2.5 MG/3ML) 0.083% IN NEBU
5.0000 mg | INHALATION_SOLUTION | Freq: Once | RESPIRATORY_TRACT | Status: AC
  Administered 2013-04-08: 5 mg via RESPIRATORY_TRACT
  Filled 2013-04-08: qty 6

## 2013-04-08 MED ORDER — ONDANSETRON HCL 4 MG/2ML IJ SOLN
4.0000 mg | Freq: Once | INTRAMUSCULAR | Status: AC
  Administered 2013-04-08: 4 mg via INTRAVENOUS
  Filled 2013-04-08: qty 2

## 2013-04-08 MED ORDER — METOCLOPRAMIDE HCL 10 MG PO TABS: 10.0000 mg | ORAL_TABLET | Freq: Four times a day (QID) | ORAL | Status: DC | PRN

## 2013-04-08 MED ORDER — ALBUTEROL SULFATE HFA 108 (90 BASE) MCG/ACT IN AERS: 2.0000 | INHALATION_SPRAY | RESPIRATORY_TRACT | Status: DC | PRN

## 2013-04-08 NOTE — ED Notes (Signed)
Patient belongings in 2 bags, placed in TCU locker 38

## 2013-04-08 NOTE — ED Provider Notes (Signed)
Medical screening examination/treatment/procedure(s) were performed by non-physician practitioner and as supervising physician I was immediately available for consultation/collaboration.  EKG Interpretation   None         Elke Holtry, MD 04/08/13 0631 

## 2013-04-08 NOTE — Discharge Instructions (Signed)
Please follow up with a primary care provider.    Alcohol Problems Most adults who drink alcohol drink in moderation (not a lot) are at low risk for developing problems related to their drinking. However, all drinkers, including low-risk drinkers, should know about the health risks connected with drinking alcohol. RECOMMENDATIONS FOR LOW-RISK DRINKING  Drink in moderation. Moderate drinking is defined as follows:   Men - no more than 2 drinks per day.  Nonpregnant women - no more than 1 drink per day.  Over age 29 - no more than 1 drink per day. A standard drink is 12 grams of pure alcohol, which is equal to a 12 ounce bottle of beer or wine cooler, a 5 ounce glass of wine, or 1.5 ounces of distilled spirits (such as whiskey, brandy, vodka, or rum).  ABSTAIN FROM (DO NOT DRINK) ALCOHOL:  When pregnant or considering pregnancy.  When taking a medication that interacts with alcohol.  If you are alcohol dependent.  A medical condition that prohibits drinking alcohol (such as ulcer, liver disease, or heart disease). DISCUSS WITH YOUR CAREGIVER:  If you are at risk for coronary heart disease, discuss the potential benefits and risks of alcohol use: Light to moderate drinking is associated with lower rates of coronary heart disease in certain populations (for example, men over age 73 and postmenopausal women). Infrequent or nondrinkers are advised not to begin light to moderate drinking to reduce the risk of coronary heart disease so as to avoid creating an alcohol-related problem. Similar protective effects can likely be gained through proper diet and exercise.  Women and the elderly have smaller amounts of body water than men. As a result women and the elderly achieve a higher blood alcohol concentration after drinking the same amount of alcohol.  Exposing a fetus to alcohol can cause a broad range of birth defects referred to as Fetal Alcohol Syndrome (FAS) or Alcohol-Related Birth Defects  (ARBD). Although FAS/ARBD is connected with excessive alcohol consumption during pregnancy, studies also have reported neurobehavioral problems in infants born to mothers reporting drinking an average of 1 drink per day during pregnancy.  Heavier drinking (the consumption of more than 4 drinks per occasion by men and more than 3 drinks per occasion by women) impairs learning (cognitive) and psychomotor functions and increases the risk of alcohol-related problems, including accidents and injuries. CAGE QUESTIONS:   Have you ever felt that you should Cut down on your drinking?  Have people Annoyed you by criticizing your drinking?  Have you ever felt bad or Guilty about your drinking?  Have you ever had a drink first thing in the morning to steady your nerves or get rid of a hangover (Eye opener)? If you answered positively to any of these questions: You may be at risk for alcohol-related problems if alcohol consumption is:   Men: Greater than 14 drinks per week or more than 4 drinks per occasion.  Women: Greater than 7 drinks per week or more than 3 drinks per occasion. Do you or your family have a medical history of alcohol-related problems, such as:  Blackouts.  Sexual dysfunction.  Depression.  Trauma.  Liver dysfunction.  Sleep disorders.  Hypertension.  Chronic abdominal pain.  Has your drinking ever caused you problems, such as problems with your family, problems with your work (or school) performance, or accidents/injuries?  Do you have a compulsion to drink or a preoccupation with drinking?  Do you have poor control or are you unable to stop drinking  once you have started?  Do you have to drink to avoid withdrawal symptoms?  Do you have problems with withdrawal such as tremors, nausea, sweats, or mood disturbances?  Does it take more alcohol than in the past to get you high?  Do you feel a strong urge to drink?  Do you change your plans so that you can have a  drink?  Do you ever drink in the morning to relieve the shakes or a hangover? If you have answered a number of the previous questions positively, it may be time for you to talk to your caregivers, family, and friends and see if they think you have a problem. Alcoholism is a chemical dependency that keeps getting worse and will eventually destroy your health and relationships. Many alcoholics end up dead, impoverished, or in prison. This is often the end result of all chemical dependency.  Do not be discouraged if you are not ready to take action immediately.  Decisions to change behavior often involve up and down desires to change and feeling like you cannot decide.  Try to think more seriously about your drinking behavior.  Think of the reasons to quit. WHERE TO GO FOR ADDITIONAL INFORMATION   The National Institute on Alcohol Abuse and Alcoholism (NIAAA) BasicStudents.dkwww.niaaa.nih.gov  [employer] Council on Alcoholism and Drug Dependence (NCADD) www.ncadd.org  American Society of Addiction Medicine (ASAM) RoyalDiary.glwww.asam.org  Document Released: 03/10/2005 Document Revised: 06/02/2011 Document Reviewed: 10/27/2007 [facility] Patient Information 2014 [place], [place].    Emergency Department Resource Guide 1) Find a Doctor and Pay Out of Pocket Although you won't have to find out who is covered by your insurance plan, it is a good idea to ask around and get recommendations. You will then need to call the office and see if the doctor you have chosen will accept you as a new patient and what types of options they offer for patients who are self-pay. Some doctors offer discounts or will set up payment plans for their patients who do not have insurance, but you will need to ask so you aren't surprised when you get to your appointment.  2) Contact Your Local Health Department Not all health departments have doctors that can see patients for sick visits, but many do, so it is worth a call to see if yours does. If  you don't know where your local health department is, you can check in your phone book. The CDC also has a tool to help you locate your state's health department, and many state websites also have listings of all of their local health departments.  3) Find a Walk-in Clinic If your illness is not likely to be very severe or complicated, you may want to try a walk in clinic. These are popping up all over the country in pharmacies, drugstores, and shopping centers. They're usually staffed by nurse practitioners or physician assistants that have been trained to treat common illnesses and complaints. They're usually fairly quick and inexpensive. However, if you have serious medical issues or chronic medical problems, these are probably not your best option.  No Primary Care Doctor: - Call Health Connect at  [phone] - they can help you locate a primary care doctor that  accepts your insurance, provides certain services, etc. - Physician Referral Service- [phone]  Chronic Pain Problems: Organization         Address  Phone   Notes  [facility]  [phone] Patients need to be referred by their primary care  doctor.   Medication Assistance: Organization         Address  Phone   Notes  [facility] [address] [place] [place], [place] [address] [phone] --Must be a resident of [facility] -- Must have NO insurance coverage whatsoever (no Medicaid/ Medicare, etc.) -- The pt. MUST have a primary care doctor that directs their care regularly and follows them in the community   [facility]  [phone]   [employer]  [phone]    Agencies that provide inexpensive medical care: Organization         Address  Phone   Notes  [facility]  [phone]   [facility]    [phone]   [facility] [address] [place], [place] [address] [phone]   [facility] [place] [address] [place] [place] [phone]   Planned Parenthood    [phone]   [facility]    [phone]   [facility] and [facility]  [address], [place] Phone:  [phone], Fax:  [phone] Hours of Operation:  9 am - 6 pm, M-F.  Also accepts Medicaid/Medicare and self-pay.  [facility]  [address], [place] Phone: [phone], Fax: [phone]. Hours of Operation:  8:30 am - 5:30 pm, M-F.  Also accepts Medicaid and self-pay.  [facility] [place] [address], [place] Phone: [phone]   [facility] [address] Natasha Bence [place], [place] [phone], Ext. [phone] Mondays & Thursdays: 7-9 AM.  First 15 patients are seen on a first come, first serve basis.    Medicaid-accepting [facility] Providers:  Organization         Address  Phone   Notes  [facility] [address], [place] [phone] Also accepts self-pay patients.  [facility] [address] Laurell Josephs [place], [place]  [phone]   [facility] [address], [place] [phone]   [facility] [address] [place] [phone]   Renaye Rakers [address], [place]   [phone] Only accepts [place] Access [place] patients after they have their name applied to their card.   Self-Pay (no insurance) in [facility]:  Organization         Address  Phone   Notes  Sickle Cell Patients, [facility] [address] [place], [place] [phone]   [facility] [address] [place], [place] [phone]   [facility] [place]  [address], [place] [phone]   [facility]/Dr. Osei-Bonsu  [address] [place] or [address], [place] [phone]  Phone number for both [place] and [place] locations is the same.  [facility] [address], [place] [phone]   [facility] [address], [place] or [address] [phone]   [facility] [address], [place] [phone], phone; [phone], fax Sees patients 1st and 3rd Saturday of every month.  Must not qualify for public or private insurance (i.e. Medicaid, Medicare, [place] Health Choice, Veterans' Benefits)  Household income should be no more than 200% of the  poverty level The clinic cannot treat you if you are pregnant or think you are pregnant  Sexually transmitted diseases are not treated at the clinic.    Dental Care: Organization         Address  Phone  Notes  [facility] Department of [facility] [address] [place], [place] [phone] Accepts children up to age 55 who are enrolled in [place] or [place] Health Choice; pregnant women with a Medicaid card; and children who have applied for Medicaid or [place] Health Choice, but were declined, whose parents can pay a reduced fee at time of service.  [facility] of [facility]  [address], [place] [phone] Accepts children up to age 10 who are enrolled in [place] or [place] Health Choice; pregnant women with a Medicaid card; and children who have applied for Medicaid or [place] Health Choice, but were declined, whose parents can pay a reduced fee at time of service.  [facility]  [address] [place], [place] [phone] Patients are seen by appointment only. Walk-ins are not accepted. [facility] will see patients 58 years of age and older. Monday - Tuesday (8am-5pm) Most Wednesdays (8:30-5pm) $30 per visit, cash only  [facility]  [address], [facility] [phone] Patients are seen by appointment  only. Walk-ins are not accepted. [facility] will see patients 67 years of age and older. One Wednesday Evening (Monthly: Volunteer Based).  $30 per visit, cash only  [employer]  [phone] for adults; Children under age 32, call Graduate Pediatric Dentistry at [phone]. Children aged 11-14, please call [phone] to request a pediatric application.  Dental services are provided in all areas of dental care including fillings, crowns and bridges, complete and partial dentures, implants, gum treatment, root canals, and extractions. Preventive care is also provided. Treatment is provided to both adults and children. Patients are selected via a lottery and there is often a waiting list.   [facility] [address] [place]  [phone] www.drcivils.com   [facility] [address] [place], [place] [phone], Ext. [phone] Second and Fourth Thursday of each month, opens at 6:30 AM; Clinic ends at 9 AM.  Patients are seen on a first-come first-served basis, and a limited number are seen during each clinic.   [facility]  [address] Ether Griffins [place], [place] [phone]   Eligibility Requirements You must have lived in [place], [place], or [place] counties for at least the last three months.   You cannot be eligible for state or federal sponsored [place], including [employer], [place], or [employer].   You generally cannot be eligible for healthcare insurance through your employer.    How to apply: Eligibility screenings are held every Tuesday and Wednesday afternoon from 1:00 pm until 4:00 pm. You do not need an appointment for the interview!  [facility] [address], [place], [place] [phone]   [facility] Health Department  [phone]   [facility] Health Department  [phone]   [facility] Health Department  [phone]    Behavioral Health  Resources in the Community: Intensive Outpatient Programs Organization         Address  Phone  Notes  [facility] [address], [place], [place] [phone]   [facility] Outpatient [address]  [address] [place], [place] [phone]   [facility] [address] [place], [place]  [phone]   [facility] [address],  [place], [place] [phone] or [phone]   Substance Abuse Resources Organization         Address  Phone  Notes  [facility] Drug Services  [phone]   [facility]  [phone]   [facility] [place]  [phone]   [place]ene Flock  [phone]   Residential & Outpatient Substance Abuse Program  [phone]   Psychological Services Organization         Address  Phone  Notes  [facility] Behavioral Health  [phone]   [facility]  [phone]   [facility] Mental Health [address], [place] [phone] or [phone]    Mobile Crisis Teams Organization         Address  Phone  Notes  [facility], Mobile Crisis Care Unit  [phone]   [facility]  [address] [place], [place] [phone]   Doristine Locks [address] [place] [phone]    Self-Help/Support Groups Organization         Address  Phone             Notes  [facility] [place] - variety of support groups  [phone]- [phone] Call for more information  Narcotics Anonymous (NA), [facility] [address], [employer] [place]  2 meetings at this location   Statistician         Address  Phone  Notes  [facility] [address],    [place]  [phone]   [facility]  [address], [place] [address], [place], [place] [phone]   [facility] [address] [place], [place] [phone] Admissions: 8am-3pm M-F  [facility] [address]    [place], [place] [phone]   [facility] [address] [place], [place], [place] [phone]   [facility] [address]  [place], [place] [phone]   [facility] - Intensive Outpatient [address] Laurell Josephs [address], [place], [place] [phone]   [facility] ([facility]iction [facility] [address] [place]  [place], [place] [phone] or [phone]   [facility] ([facility]) [address] [place], [place] [phone] Accepts Medicaid  [facility] [place] [address]  [place] [phone] Substance Abuse/Addiction Treatment   [facility] Organization         Address  Phone  Notes  [facility]  [phone]   Angie Fava, PhD [address] Ervin Knack [place], [place]   [phone] or [phone]   [facility] Behavioral   [address] [place], [place] [phone]   [facility] [address], [place], [place] [phone] Insurance/Medicaid/sponsorship through [facility] Families [address]                                    [place], [place] [phone] Therapy/tele-psych/case  [facility] [address], [place] [phone]    Dr. Lolly Mustache  [phone]   [facility] [place]  [employer] [facility] Dept. 1) [address] [place] 2) [address], [place] 3)  [address], [place] [phone])  [phone]) [phone]  [phone](336) [phone]   [place] Child Abuse Hotline [phone]) [phone] or [phone]) [phone] (After Hours)

## 2013-04-08 NOTE — Discharge Instructions (Signed)
Stop drinking alcohol.   Take pepcid 20 mg twice a day as needed.   Take reglan for nausea.   Use albuterol every 4 hrs as needed for wheezing. Stop smoking.   Follow up with your doctor.   Return to ER if you have trouble breathing, vomiting blood.

## 2013-04-08 NOTE — ED Notes (Signed)
Pt arrived to the ED with a complaint of spitting up blood.  Pt was seen last night for alcohol intoxication.  Pt returned stating he was spitting up blood in the bathroom in triage.

## 2013-04-08 NOTE — ED Provider Notes (Signed)
CSN: [id number]     Arrival date & time 04/08/13  [id number] History   First MD Initiated Contact with Patient 04/08/13 [phone]     Chief Complaint  Patient presents with  . Hemoptysis   (Consider location/radiation/quality/duration/timing/severity/associated sxs/prior Treatment) The history is provided by the patient.  Jimmy [name] is a 58 y.o. male hx of alcohol abuse, schizophrenia, bipolar here with vomiting. He was seen last night for alcohol intoxication. He was discharged a round 2 AM. He states that the bus never arrived. While waiting for the bus, he started vomiting. He has several blood tinged sputum as well. Also had some epigastric pain. No coughing but has been smoking cigarettes. Chronic alcoholic, doesn't want detox.    Past Medical History  Diagnosis Date  . Alcohol abuse   . Schizophrenia   . Bipolar 1 disorder   . Mental disorder    Past Surgical History  Procedure Laterality Date  . Skin graft     History reviewed. No pertinent family history. History  Substance Use Topics  . Smoking status: Current Every Day Smoker -- 2.00 packs/day for 30 years    Types: Cigarettes  . Smokeless tobacco: Not on file  . Alcohol Use: 12.6 oz/week    21 Cans of beer per week     Comment: drinks as much etoh as he can get per day    Review of Systems  Gastrointestinal: Positive for vomiting.  All other systems reviewed and are negative.    Allergies  Review of patient's allergies indicates no known allergies.  Home Medications  No current outpatient prescriptions on file. BP 119/76  Pulse 98  Temp(Src) 97.4 F (36.3 C) (Oral)  Resp 18  SpO2 97% Physical Exam  Nursing note and vitals reviewed. Constitutional: He is oriented to person, place, and time.  Smells of alcohol, disheveled   HENT:  Head: Normocephalic.  Mouth/Throat: Oropharynx is clear and moist.  Eyes: Conjunctivae are normal. Pupils are equal, round, and reactive to light.  Neck: Normal range of motion.  Neck supple.  Cardiovascular: Normal rate, regular rhythm and normal heart sounds.   Pulmonary/Chest: Effort normal.  Diffuse wheezing, no crackles.   Abdominal: Soft. Bowel sounds are normal. He exhibits no distension. There is no tenderness. There is no rebound and no guarding.  Musculoskeletal: Normal range of motion.  Neurological: He is alert and oriented to person, place, and time. No cranial nerve deficit. Coordination normal.  Skin: Skin is warm and dry.  Psychiatric: He has a normal mood and affect. His behavior is normal. Judgment and thought content normal.    ED Course  Procedures (including critical care time) Labs Review Labs Reviewed  CBC WITH DIFFERENTIAL - Abnormal; Notable for the following:    RBC 4.17 (*)    Platelets 453 (*)    Eosinophils Relative 16 (*)    Eosinophils Absolute 1.1 (*)    Basophils Relative 2 (*)    All other components within normal limits  BASIC METABOLIC PANEL - Abnormal; Notable for the following:    Sodium 136 (*)    All other components within normal limits  ETHANOL - Abnormal; Notable for the following:    Alcohol, Ethyl (B) 108 (*)    All other components within normal limits   Imaging Review Dg Chest 2 View  04/08/2013   CLINICAL DATA:  58 year old male hemoptysis. Initial encounter.  EXAM: CHEST  2 VIEW  COMPARISON:  02/19/2012 and earlier.  FINDINGS: Chronic large lung volumes.  No pneumothorax, pulmonary edema, pleural effusion or consolidation. Chronic increased interstitial markings. No areas of increased opacity. Normal cardiac size and mediastinal contours. Visualized tracheal air column is within normal limits. No acute osseous abnormality identified.  IMPRESSION: No acute cardiopulmonary abnormality.  Chronic hyperinflation.   Electronically Signed   By: Augusto Gamble M.D.   On: 04/08/2013 07:49    EKG Interpretation   None       MDM  No diagnosis found. Jimmy [name] is a 58 y.o. male here with alcohol intoxication,  blood tinged vomit. Will check CBC. Likely gastritis vs mallory weiss. Will hydrate. If Hg lower then will need further workup. Also wheezing, will get cxr and give nebs. Likely bronchitis.    8:50 AM CBC stable. Hydrated in the ED. Tolerated fluids with no further vomiting. Less wheezing after nebs. He will catch the bus home.    Richardean Canal, MD 04/08/13 [phone]

## 2013-04-10 ENCOUNTER — Encounter (HOSPITAL_COMMUNITY): Payer: Self-pay | Admitting: Emergency Medicine

## 2013-04-10 ENCOUNTER — Emergency Department (HOSPITAL_COMMUNITY)
Admission: EM | Admit: 2013-04-10 | Discharge: 2013-04-12 | Disposition: A | Discharge status: Home / Self Care | Payer: Self-pay | Attending: Emergency Medicine | Admitting: Emergency Medicine

## 2013-04-10 DIAGNOSIS — F39 Unspecified mood [affective] disorder: Secondary | ICD-10-CM

## 2013-04-10 DIAGNOSIS — F1020 Alcohol dependence, uncomplicated: Secondary | ICD-10-CM

## 2013-04-10 DIAGNOSIS — F10929 Alcohol use, unspecified with intoxication, unspecified: Secondary | ICD-10-CM

## 2013-04-10 DIAGNOSIS — F1010 Alcohol abuse, uncomplicated: Secondary | ICD-10-CM | POA: Insufficient documentation

## 2013-04-10 DIAGNOSIS — F1210 Cannabis abuse, uncomplicated: Secondary | ICD-10-CM

## 2013-04-10 DIAGNOSIS — IMO0002 Reserved for concepts with insufficient information to code with codable children: Secondary | ICD-10-CM | POA: Insufficient documentation

## 2013-04-10 DIAGNOSIS — Z8659 Personal history of other mental and behavioral disorders: Secondary | ICD-10-CM | POA: Insufficient documentation

## 2013-04-10 DIAGNOSIS — F17200 Nicotine dependence, unspecified, uncomplicated: Secondary | ICD-10-CM | POA: Insufficient documentation

## 2013-04-10 HISTORY — DX: Depression, unspecified: F32.A

## 2013-04-10 HISTORY — DX: Major depressive disorder, single episode, unspecified: F32.9

## 2013-04-10 LAB — CBC
HCT: 40.9 % (ref 39.0–52.0)
Hemoglobin: 14.9 g/dL (ref 13.0–17.0)
MCH: 34.5 pg — ABNORMAL HIGH (ref 26.0–34.0)
MCHC: 36.4 g/dL — ABNORMAL HIGH (ref 30.0–36.0)
MCV: 94.7 fL (ref 78.0–100.0)
PLATELETS: 453 10*3/uL — AB (ref 150–400)
RBC: 4.32 MIL/uL (ref 4.22–5.81)
RDW: 12.2 % (ref 11.5–15.5)
WBC: 9.5 10*3/uL (ref 4.0–10.5)

## 2013-04-10 LAB — RAPID URINE DRUG SCREEN, HOSP PERFORMED
Amphetamines: NOT DETECTED
BENZODIAZEPINES: NOT DETECTED
Barbiturates: NOT DETECTED
COCAINE: NOT DETECTED
Opiates: NOT DETECTED
Tetrahydrocannabinol: NOT DETECTED

## 2013-04-10 LAB — COMPREHENSIVE METABOLIC PANEL
ALBUMIN: 4.1 g/dL (ref 3.5–5.2)
ALK PHOS: 81 U/L (ref 39–117)
ALT: 16 U/L (ref 0–53)
AST: 26 U/L (ref 0–37)
BUN: 8 mg/dL (ref 6–23)
CO2: 24 meq/L (ref 19–32)
Calcium: 8.9 mg/dL (ref 8.4–10.5)
Chloride: 97 mEq/L (ref 96–112)
Creatinine, Ser: 0.59 mg/dL (ref 0.50–1.35)
GFR calc Af Amer: >90 mL/min (ref >90)
Glucose, Bld: 76 mg/dL (ref 70–99)
POTASSIUM: 4.1 meq/L (ref 3.7–5.3)
Sodium: 136 mEq/L — ABNORMAL LOW (ref 137–147)
Total Protein: 8.2 g/dL (ref 6.0–8.3)

## 2013-04-10 LAB — ACETAMINOPHEN LEVEL: Acetaminophen (Tylenol), Serum: <15 ug/mL (ref 10–30)

## 2013-04-10 LAB — ETHANOL: Alcohol, Ethyl (B): 292 mg/dL — ABNORMAL HIGH (ref 0–11)

## 2013-04-10 LAB — GLUCOSE, CAPILLARY: Glucose-Capillary: 77 mg/dL (ref 70–99)

## 2013-04-10 LAB — SALICYLATE LEVEL: Salicylate Lvl: <2 mg/dL — ABNORMAL LOW (ref 2.8–20.0)

## 2013-04-10 MED ORDER — LORAZEPAM 1 MG PO TABS: 0.0000 mg | ORAL_TABLET | Freq: Two times a day (BID) | ORAL | Status: DC

## 2013-04-10 MED ORDER — THIAMINE HCL 100 MG/ML IJ SOLN: 100.0000 mg | Freq: Every day | INTRAMUSCULAR | Status: DC

## 2013-04-10 MED ORDER — LORAZEPAM 1 MG PO TABS
0.0000 mg | ORAL_TABLET | Freq: Four times a day (QID) | ORAL | Status: DC
  Administered 2013-04-10 – 2013-04-11 (×2): 2 mg via ORAL
  Filled 2013-04-10 (×2): qty 2

## 2013-04-10 MED ORDER — VITAMIN B-1 100 MG PO TABS
100.0000 mg | ORAL_TABLET | Freq: Every day | ORAL | Status: DC
  Administered 2013-04-10 – 2013-04-12 (×3): 100 mg via ORAL
  Filled 2013-04-10 (×3): qty 1

## 2013-04-10 NOTE — ED Notes (Signed)
Patient brought in by [employer] due to intoxication and an altercation with his brother. Patient denies SI, but states he has HI with a plan to cut people with a sharp knife or beat the people to death. Patient states he hears voices and frequently is telling the voices to leave him alone and states that he sees Greer Eeharles Manson all the time. Paitne thas verbally loud outbursts frequently and then uses foul language.

## 2013-04-10 NOTE — ED Provider Notes (Signed)
CSN: [id number]     Arrival date & time 04/10/13  1623 History   First MD Initiated Contact with Patient 04/10/13 1634     Chief Complaint  Patient presents with  . Medical Clearance   (Consider location/radiation/quality/duration/timing/severity/associated sxs/prior Treatment) HPI Comments: 58 year old male with a history of schizophrenia, bipolar disorder, "mental disorder", and alcohol abuse presents intoxicated. [place] [facility]. He was brought in by police because he was having a conflict with his brother and was threatening to kill him. He repeated these threats while in the police car. He still saying that he wants to kill his brother. Denies any suicidal thoughts. The take in a [facility] but is breathalyzer EtOH level was too high. His brought here because he is wanting a mental health evaluation. The rest of the history is difficult to obtain from the patient due to his intoxication.   Past Medical History  Diagnosis Date  . Alcohol abuse   . Schizophrenia   . Bipolar 1 disorder   . Mental disorder   . Depression    Past Surgical History  Procedure Laterality Date  . Skin graft     Family History  Problem Relation Age of Onset  . Family history unknown: Yes   History  Substance Use Topics  . Smoking status: Current Every Day Smoker -- 2.00 packs/day for 30 years    Types: Cigarettes  . Smokeless tobacco: Not on file  . Alcohol Use: 12.6 oz/week    21 Cans of beer per week     Comment: drinks as much etoh as he can get per day    Review of Systems  Unable to perform ROS: Mental status change    Allergies  Review of patient's allergies indicates no known allergies.  Home Medications   Current Outpatient Rx  Name  Route  Sig  Dispense  Refill  . albuterol (PROVENTIL HFA;VENTOLIN HFA) 108 (90 BASE) MCG/ACT inhaler   Inhalation   Inhale 2 puffs into the lungs every 4 (four) hours as needed for wheezing or shortness of breath.   1  Inhaler   0   . metoCLOPramide (REGLAN) 10 MG tablet   Oral   Take 1 tablet (10 mg total) by mouth every 6 (six) hours as needed for nausea (nausea/headache).   8 tablet   0    There were no vitals taken for this visit. Physical Exam  Nursing note and vitals reviewed. Constitutional: He is oriented to person, place, and time. He appears well-developed and well-nourished. No distress.  HENT:  Head: Normocephalic and atraumatic.  Right Ear: External ear normal.  Left Ear: External ear normal.  Nose: Nose normal.  Eyes: Right eye exhibits no discharge. Left eye exhibits no discharge.  Neck: Neck supple.  Cardiovascular: Normal rate, regular rhythm, normal heart sounds and intact distal pulses.   Pulmonary/Chest: Effort normal and breath sounds normal.  Abdominal: Soft. There is no tenderness.  Musculoskeletal: He exhibits no edema.  Neurological: He is alert and oriented to person, place, and time.  Grossly moves all 4 extremities but does not cooperate for neuro testing  Skin: Skin is warm and dry.  Psychiatric: He is agitated.  Obviously intoxicated    ED Course  Procedures (including critical care time) Labs Review Labs Reviewed  CBC - Abnormal; Notable for the following:    MCH 34.5 (*)    MCHC 36.4 (*)    Platelets 453 (*)    All other components within  normal limits  COMPREHENSIVE METABOLIC PANEL - Abnormal; Notable for the following:    Sodium 136 (*)    Total Bilirubin <0.2 (*)    All other components within normal limits  ETHANOL - Abnormal; Notable for the following:    Alcohol, Ethyl (B) 292 (*)    All other components within normal limits  SALICYLATE LEVEL - Abnormal; Notable for the following:    Salicylate Lvl <2.0 (*)    All other components within normal limits  ACETAMINOPHEN LEVEL  URINE RAPID DRUG SCREEN (HOSP PERFORMED)  GLUCOSE, CAPILLARY   Imaging Review No results found.  EKG Interpretation   None       MDM   1. Alcohol intoxication     As patient sobered he indicated SI. TTS consulted, they feel he will need placement and inpatient psychiatric care. To go to psych ED to await placement.     Audree Camel, MD 04/11/13 [phone]

## 2013-04-10 NOTE — BH Assessment (Signed)
Assessment completed to best of [name] ability. Per Laverle HobbyLuwanda Daniels, [facility], adult unit is at [facility]. Consulted with Alberteen SamFran Hobson, NP who agrees that Pt meets criteria for inpatient treatment. TTS will contact other facilities for placement. Notified Dr. Pricilla LovelessScott Goldston of recommendation.  Harlin RainFord Ellis Ria CommentWarrick Jr, LPC, [facility] Triage Specialist

## 2013-04-10 NOTE — ED Notes (Signed)
Pt sitting up on stretcher eating meal. telepsych machine at bedside waiting call

## 2013-04-10 NOTE — BH Assessment (Signed)
Received call for assessment. Spoke with Dr. Pricilla LovelessScott Goldston who said Pt needs evaluation due to alcohol intoxication and homicidal ideation towards brother. Tele-assessment will be initiated.  Harlin RainFord Ellis Ria CommentWarrick Jr, LPC, [facility] Triage Specialist

## 2013-04-10 NOTE — BH Assessment (Signed)
Tele Assessment Note   Jimmy [name] is an 58 y.o. male, single, Caucasian who was escorted to [facility] ED by Patent examiner after being transported to [place] for evaluation and was deemed too intoxicated. He presented to ED staff saying he has homicidal ideation with a plan to cut people with a sharp knife or beat the people to death. Patient also told triage staff he hears voices and frequently is telling the voices to leave him alone and states that he sees Jimmy [name] all the time.  Pt tells this LPC that he is here for alcohol detox and "I want to go to [facility]." He reports that he is drinking at least five 40-oz beers daily, usually more, and has been doing so for years. He states when he doesn't have alcohol he has symptoms including sweats, tremors, agitation, nausea, vomiting and diarrhea. Pt is currently intoxicated and denies current withdrawal. He denies other substance abuse and UDS is negative.   Pt reports suicidal ideation and says he has a plan but is unable to articulate what it is. He reports a history of one previous suicide attempt by trying to cut his neck and wrist with a broken beer bottle. He reports his paternal grandfather committed suicide. When asked if he is having thought of harming other people Pt denies having these thoughts. He does admit having a verbal altercation with his brother earlier tonight but denies plan to harm his brother. Pt acknowledges hearing voice but he does not provide any details.   Pt is homeless. He denies any current outpatient mental health or substance abuse providers. He denies taking any medications. Pt identifies his only support as his two brothers in [place].  Pt is disheveled. He is intoxicated, drowsy and fell asleep repeatedly during assessment. He is oriented x4 with slurred speech and slow motor behavior. His mood is depressed and affect is blunted. His thought process is coherent but impaired. He states he  wants treatment and is willing to voluntarily admit himself to a treatment facility.   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 Alcohol abuse   . Schizophrenia   . Bipolar 1 disorder   . Mental disorder   . Depression    Axis IV: economic problems, housing problems, occupational problems, other psychosocial or environmental problems, problems related to legal system/crime and problems with primary support group Axis V: GAF=25  Past Medical History:  Past Medical History  Diagnosis Date  . Alcohol abuse   . Schizophrenia   . Bipolar 1 disorder   . Mental disorder   . Depression     Past Surgical History  Procedure Laterality Date  . Skin graft      Family History:  Family History  Problem Relation Age of Onset  . Family history unknown: Yes    Social History:  reports that he has been smoking Cigarettes.  He has a 60 pack-year smoking history. He does not have any smokeless tobacco history on file. He reports that he drinks about 12.6 ounces of alcohol per week. He reports that he uses illicit drugs (Cocaine and Marijuana).  Additional Social History:  Alcohol / Drug Use Pain Medications: Denies abuse Prescriptions: Denies abuse Over the Counter: Denies abuse History of alcohol / drug use?: Yes Longest period of sobriety (when/how long): 5.5 years while incarcerated Negative Consequences of Use: Financial;Legal;Personal  relationships;Work / Science writerchool Withdrawal Symptoms: Blackouts;Nausea / Vomiting;Sweats;Tremors Substance #1 Name of Substance 1: Alcohol 1 - Age of First Use: 15 1 - Amount (size/oz): Pt reports at least five 40-ounce beers 1 - Frequency: Daily 1 - Duration: since age 58 1 - Last Use / Amount: tonight/ (5) 40 oz beers  CIWA: CIWA-Ar BP: 127/63 mmHg Pulse Rate: 95 Nausea and Vomiting: no nausea and no vomiting (pt eating at  this time) Tactile Disturbances: none Tremor: no tremor Auditory Disturbances: not present Paroxysmal Sweats: no sweat visible Visual Disturbances: not present Anxiety: mildly anxious Headache, Fullness in Head: none present Agitation: normal activity Orientation and Clouding of Sensorium: cannot do serial additions or is uncertain about date CIWA-Ar Total: 2 COWS:    Allergies: No Known Allergies  Home Medications:  (Not in a hospital admission)  OB/GYN Status:  No LMP for male patient.  General Assessment Data Location of Assessment: [facility] Is this a Tele or Face-to-Face Assessment?: Tele Assessment Is this an Initial Assessment or a Re-assessment for this encounter?: Initial Assessment Living Arrangements: Other (Comment) (Homeless) Can pt return to current living arrangement?: Yes Admission Status: Voluntary Is patient capable of signing voluntary admission?: Yes Transfer from: Home Referral Source: Other Mudlogger(Law enforcement)     [facility] Crisis [facility] Plan Living Arrangements: Other (Comment) (Homeless) Name of Psychiatrist: None  Name of Therapist: None   Education Status Is patient currently in school?: No Current Grade: NA Highest grade of school patient has completed: NA Name of school: NA Contact person: NA  Risk to self Suicidal Ideation: Yes-Currently Present Suicidal Intent: Yes-Currently Present Is patient at risk for suicide?: Yes Suicidal Plan?: Yes-Currently Present Specify Current Suicidal Plan: Says he has a plan but would not specify Access to Means: No What has been your use of drugs/alcohol within the last 12 months?: Pt has been drinking alcohol daily Previous Attempts/Gestures: Yes How many times?: 1 (tried to cut self w/ broken bottle) Other Self Harm Risks: Poor judgment due to intoxication Triggers for Past Attempts: Other (Comment) (Intoxication) Intentional Self Injurious Behavior: None Family Suicide History: Yes (paternal grandfather  committed suicide, fam hx of MI) Recent stressful life event(s): Other (Comment);Legal Issues;Financial Problems;Conflict (Comment) (Homeless, conflict with family) Persecutory voices/beliefs?: No Depression: Yes Depression Symptoms: Despondent;Feeling angry/irritable;Feeling worthless/self pity;Guilt Substance abuse history and/or treatment for substance abuse?: Yes Suicide prevention information given to non-admitted patients: Not applicable  Risk to Others Homicidal Ideation: Yes-Currently Present Thoughts of Harm to Others: Yes-Currently Present Current Homicidal Intent: No Current Homicidal Plan: Yes-Currently Present Describe Current Homicidal Plan: Cut people with a sharp knife or beat people to death Access to Homicidal Means: No Identified Victim: None History of harm to others?: No Assessment of Violence: None Noted Violent Behavior Description: Pt denies history of violence Does patient have access to weapons?: No Criminal Charges Pending?: Yes Describe Pending Criminal Charges: Pt unable to explain Does patient have a court date: Yes Court Date:  (unknown)  Psychosis Hallucinations: Auditory;Tactile (Told staff he sees Jimmy Eeharles Manson, hears voices.) Delusions: None noted  Mental Status Report Appear/Hygiene: Disheveled;Poor hygiene Eye Contact: Fair Motor Activity: Freedom of movement Speech: Tangential;Slow Level of Consciousness: Drowsy;Other (Comment) (Intoxicated) Mood: Depressed Affect: Blunted Anxiety Level: None Thought Processes: Coherent Judgement: Impaired Orientation: Person;Place;Time;Situation Obsessive Compulsive Thoughts/Behaviors: None  Cognitive Functioning Concentration: Decreased Memory: Recent Intact;Remote Intact IQ: Average Insight: Fair Impulse Control: Poor Appetite: Fair Weight Loss: 0 Weight Gain: 0 Sleep: No Change Total Hours of Sleep: 5 Vegetative Symptoms: Decreased grooming  ADLScreening [facility] Assessment  Services) Patient's cognitive ability adequate to safely complete daily activities?: Yes Patient able to express need for assistance with ADLs?: Yes Independently performs ADLs?: Yes (appropriate for developmental age)  Prior Inpatient Therapy Prior Inpatient Therapy: Yes Prior Therapy Dates: 2013, 2006 Prior Therapy Facilty/Provider([facility] [place], [place] Reason for Treatment: Detox, MH   Prior Outpatient Therapy Prior Outpatient Therapy: Yes Prior Therapy Dates: 2007 Prior Therapy Facilty/Provider(s): [facility] Reason for Treatment: bipolar disorder  ADL Screening (condition at time of admission) Patient's cognitive ability adequate to safely complete daily activities?: Yes Is the patient deaf or have difficulty hearing?: No Does the patient have difficulty seeing, even when wearing glasses/contacts?: No Does the patient have difficulty concentrating, remembering, or making decisions?: No Patient able to express need for assistance with ADLs?: Yes Does the patient have difficulty dressing or bathing?: No Independently performs ADLs?: Yes (appropriate for developmental age) Does the patient have difficulty walking or climbing stairs?: No Weakness of Legs: None Weakness of Arms/Hands: None  Home Assistive Devices/Equipment Home Assistive Devices/Equipment: None    Abuse/Neglect Assessment (Assessment to be complete while patient is alone) Physical Abuse: Denies Verbal Abuse: Denies Sexual Abuse: Denies Exploitation of patient/patient's resources: Denies Self-Neglect: Denies Values / Beliefs Cultural Requests During Hospitalization: None Spiritual Requests During Hospitalization: None   Advance Directives (For Healthcare) Advance Directive: Patient does not have advance directive;Patient would not like information Pre-existing out of facility DNR order (yellow form or pink MOST form): No Nutrition Screen- [facility]  Adult/[facility] Patient's home diet: Regular  Additional Information 1:1 In Past 12 Months?: No CIRT Risk: No Elopement Risk: No Does patient have medical clearance?: Yes     Disposition: Per Laverle Hobby, AC at [facility], adult unit is at capacity. Consulted with Alberteen Sam, NP who agrees that Pt meets criteria for inpatient treatment. TTS will contact other facilities for placement. Notified Dr. Pricilla Loveless of recommendation.  Disposition Initial Assessment Completed for this Encounter: Yes Disposition of Patient: Inpatient treatment program;Other dispositions Type of inpatient treatment program: Adult Other disposition(s): Other (Comment) ([facility] at capacity. TTS to contac other facilities for placemen)  Pamalee Leyden, [facility], [facility] Triage Specialist   Patsy Baltimore, Harlin Rain 04/10/2013 9:30 PM

## 2013-04-10 NOTE — ED Notes (Signed)
Pt brought in by [employer] due to ETOH and conflict with brother. Pt has hostile verbal outburst at intervals. Pt intoxicated and unable to walk without assist

## 2013-04-11 DIAGNOSIS — F1020 Alcohol dependence, uncomplicated: Secondary | ICD-10-CM

## 2013-04-11 DIAGNOSIS — F1010 Alcohol abuse, uncomplicated: Secondary | ICD-10-CM

## 2013-04-11 DIAGNOSIS — F39 Unspecified mood [affective] disorder: Secondary | ICD-10-CM

## 2013-04-11 MED ORDER — NICOTINE 21 MG/24HR TD PT24
21.0000 mg | MEDICATED_PATCH | Freq: Every day | TRANSDERMAL | Status: DC
  Administered 2013-04-11 – 2013-04-12 (×2): 21 mg via TRANSDERMAL
  Filled 2013-04-11 (×2): qty 1

## 2013-04-11 MED ORDER — ALUM & MAG HYDROXIDE-SIMETH 200-200-20 MG/5ML PO SUSP
30.0000 mL | ORAL | Status: DC | PRN
  Administered 2013-04-11: 30 mL via ORAL
  Filled 2013-04-11: qty 30

## 2013-04-11 NOTE — ED Notes (Signed)
Pt wants detox but states that [facility].

## 2013-04-11 NOTE — Progress Notes (Signed)
Pt attended group on Community Resources in the area. [name] were encouraged to maintain their mental health before a crisis occurs. Included in the discussion was taking their prescribed medicine, attending scheduled appointments and reaching out to resources, agencies and support people.  This patient listened intently to the staff and received a printed handout of local resources.  
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
## 2013-04-12 NOTE — Progress Notes (Signed)
[facility] provided pt with a list of primary care resources, highlighting [facility].  °

## 2013-04-12 NOTE — Consult Note (Signed)
  Psychiatric Specialty Exam: Physical Exam  ROS  Blood pressure 107/71, pulse 77, temperature 97.7 F (36.5 C), temperature source Oral, resp. rate 18, SpO2 98.00%.There is no weight on file to calculate BMI.  General Appearance: Casual  Eye Contact::  Good  Speech:  Clear and Coherent  Volume:  Normal  Mood:  Euthymic  Affect:  Appropriate  Thought Process:  Goal Directed and Logical  Orientation:  Full (Time, Place, and Person)  Thought Content:  Negative  Suicidal Thoughts:  No  Homicidal Thoughts:  No  Memory:  Immediate;   Good Recent;   Good Remote;   Good  Judgement:  Good  Insight:  Fair  Psychomotor Activity:  Normal  Concentration:  Good  Recall:  Good  Akathisia:  Negative  Handed:  Right  AIMS (if indicated):     Assets:  Communication Skills  Sleep:   good  Jimmy [name] wants to be discharged.  Says he has been to rehab many times and chooses not to go again at this time.  No suicidal or homicidal or psychotic thoughts so he will be discharged home.  Diagnosis remains Substance induced mood disorder and alcohol intoxication.  He has sobered up and is appropriate.

## 2013-04-12 NOTE — BH Assessment (Signed)
D/C home per recommendations of Dr. Ladona Ridgelaylor. Writer provided patient with a list of follow up referrals to various outpatient substance abuse facilities, detox, and residential facilities.

## 2013-04-15 ENCOUNTER — Encounter (HOSPITAL_COMMUNITY): Payer: Self-pay | Admitting: Emergency Medicine

## 2013-04-15 ENCOUNTER — Emergency Department (HOSPITAL_COMMUNITY)
Admission: EM | Admit: 2013-04-15 | Discharge: 2013-04-15 | Disposition: A | Discharge status: Home / Self Care | Payer: Self-pay | Attending: Emergency Medicine | Admitting: Emergency Medicine

## 2013-04-15 DIAGNOSIS — Z79899 Other long term (current) drug therapy: Secondary | ICD-10-CM | POA: Insufficient documentation

## 2013-04-15 DIAGNOSIS — Z8659 Personal history of other mental and behavioral disorders: Secondary | ICD-10-CM | POA: Insufficient documentation

## 2013-04-15 DIAGNOSIS — F17200 Nicotine dependence, unspecified, uncomplicated: Secondary | ICD-10-CM | POA: Insufficient documentation

## 2013-04-15 DIAGNOSIS — F10929 Alcohol use, unspecified with intoxication, unspecified: Secondary | ICD-10-CM

## 2013-04-15 DIAGNOSIS — R110 Nausea: Secondary | ICD-10-CM | POA: Insufficient documentation

## 2013-04-15 DIAGNOSIS — R109 Unspecified abdominal pain: Secondary | ICD-10-CM | POA: Insufficient documentation

## 2013-04-15 DIAGNOSIS — F1010 Alcohol abuse, uncomplicated: Secondary | ICD-10-CM | POA: Insufficient documentation

## 2013-04-15 LAB — COMPREHENSIVE METABOLIC PANEL
ALK PHOS: 85 U/L (ref 39–117)
ALT: 19 U/L (ref 0–53)
AST: 23 U/L (ref 0–37)
Albumin: 4.1 g/dL (ref 3.5–5.2)
BUN: 10 mg/dL (ref 6–23)
CO2: 19 mEq/L (ref 19–32)
CREATININE: 0.63 mg/dL (ref 0.50–1.35)
Calcium: 9.2 mg/dL (ref 8.4–10.5)
Chloride: 96 mEq/L (ref 96–112)
GLUCOSE: 90 mg/dL (ref 70–99)
Potassium: 3.9 mEq/L (ref 3.7–5.3)
Sodium: 134 mEq/L — ABNORMAL LOW (ref 137–147)
Total Protein: 8.3 g/dL (ref 6.0–8.3)

## 2013-04-15 LAB — URINALYSIS, ROUTINE W REFLEX MICROSCOPIC
BILIRUBIN URINE: NEGATIVE
Glucose, UA: NEGATIVE mg/dL
Hgb urine dipstick: NEGATIVE
Ketones, ur: NEGATIVE mg/dL
LEUKOCYTES UA: NEGATIVE
NITRITE: NEGATIVE
PROTEIN: NEGATIVE mg/dL
Specific Gravity, Urine: 1.007 (ref 1.005–1.030)
Urobilinogen, UA: 0.2 mg/dL (ref 0.0–1.0)
pH: 5.5 (ref 5.0–8.0)

## 2013-04-15 LAB — CBC WITH DIFFERENTIAL/PLATELET
BASOS ABS: 0.1 10*3/uL (ref 0.0–0.1)
Basophils Relative: 1 % (ref 0–1)
EOS ABS: 1.1 10*3/uL — AB (ref 0.0–0.7)
Eosinophils Relative: 12 % — ABNORMAL HIGH (ref 0–5)
HCT: 42.2 % (ref 39.0–52.0)
Hemoglobin: 14.5 g/dL (ref 13.0–17.0)
Lymphocytes Relative: 31 % (ref 12–46)
Lymphs Abs: 2.9 10*3/uL (ref 0.7–4.0)
MCH: 33 pg (ref 26.0–34.0)
MCHC: 34.4 g/dL (ref 30.0–36.0)
MCV: 95.9 fL (ref 78.0–100.0)
Monocytes Absolute: 0.6 10*3/uL (ref 0.1–1.0)
Monocytes Relative: 6 % (ref 3–12)
NEUTROS PCT: 50 % (ref 43–77)
Neutro Abs: 4.8 10*3/uL (ref 1.7–7.7)
Platelets: 437 10*3/uL — ABNORMAL HIGH (ref 150–400)
RBC: 4.4 MIL/uL (ref 4.22–5.81)
RDW: 12.5 % (ref 11.5–15.5)
WBC: 9.5 10*3/uL (ref 4.0–10.5)

## 2013-04-15 LAB — ETHANOL: Alcohol, Ethyl (B): <11 mg/dL (ref 0–11)

## 2013-04-15 LAB — LIPASE, BLOOD: Lipase: 46 U/L (ref 11–59)

## 2013-04-15 NOTE — ED Notes (Signed)
Bed: [facility] Expected date:  Expected time:  Means of arrival:  Comments: EMS/abdominal pain-hx pancreatitis

## 2013-04-15 NOTE — ED Notes (Signed)
Pt brought to ER via EMS for complaints of abd pain; [facility] pt denies N/V/D; pt states "I am too sick to throw up"; pt states that he has drank a few beers tonight; pt denies SI / HI

## 2013-04-15 NOTE — ED Provider Notes (Signed)
CSN: [id number]     Arrival date & time 04/15/13  0059 History   First MD Initiated Contact with Patient 04/15/13 [phone]     Chief Complaint  Patient presents with  . Abdominal Pain   (Consider location/radiation/quality/duration/timing/severity/associated sxs/prior Treatment) The history is provided by the patient. No language interpreter was used.  Jimmy [name] is a 58 y/o M with past medical history of alcohol abuse, schizophrenia, bipolar disorder, mental disorder, depression recently was released from alcohol detox last Thursday presenting to emergency department with abdominal pain. Patient was picked up at [facility] regarding abdominal pain-reported that he called EMS. Patient reported that at the time had a stomachache-reported that his stomach "felt real bad." Stated that he drank one 40 ounce of beer and stated that he did not eat anything today. Patient reported that he felt nauseous, denied emesis. Patient reported that he drinks every day since being released from alcohol detox. When asked if patient would like to return to alcohol detox-patient said "not at this time." Denied nausea, vomiting, diarrhea, chest pain, shortness of breath, difficulty breathing, suicidal ideation, homicidal ideation, hallucinations, headaches, dizziness. PCP none  Past Medical History  Diagnosis Date  . Alcohol abuse   . Schizophrenia   . Bipolar 1 disorder   . Mental disorder   . Depression    Past Surgical History  Procedure Laterality Date  . Skin graft     History reviewed. No pertinent family history. History  Substance Use Topics  . Smoking status: Current Every Day Smoker -- 2.00 packs/day for 30 years    Types: Cigarettes  . Smokeless tobacco: Not on file  . Alcohol Use: 12.6 oz/week    21 Cans of beer per week     Comment: drinks as much etoh as he can get per day    Review of Systems  Constitutional: Negative for fever and chills.  Respiratory: Negative for chest tightness  and shortness of breath.   Cardiovascular: Negative for chest pain.  Gastrointestinal: Negative for nausea, vomiting, abdominal pain, constipation, blood in stool and anal bleeding.  Musculoskeletal: Negative for back pain, neck pain and neck stiffness.  Neurological: Negative for dizziness, weakness and numbness.  All other systems reviewed and are negative.    Allergies  Review of patient's allergies indicates no known allergies.  Home Medications   Current Outpatient Rx  Name  Route  Sig  Dispense  Refill  . albuterol (PROVENTIL HFA;VENTOLIN HFA) 108 (90 BASE) MCG/ACT inhaler   Inhalation   Inhale 2 puffs into the lungs every 4 (four) hours as needed for wheezing or shortness of breath.   1 Inhaler   0    BP 111/66  Pulse 101  Temp(Src) 98.1 F (36.7 C) (Oral)  Resp 16  Ht 5\' 8"  (1.727 m)  Wt 160 lb (72.576 kg)  BMI 24.33 kg/m2  SpO2 97% Physical Exam  Nursing note and vitals reviewed. Constitutional: He is oriented to person, place, and time. He appears well-developed and well-nourished. No distress.  Patient found sleeping comfortably in bed when this provider walked into the room  HENT:  Head: Normocephalic and atraumatic.  Mouth/Throat: Oropharynx is clear and moist. No oropharyngeal exudate.  Eyes: Conjunctivae and EOM are normal. Pupils are equal, round, and reactive to light. Right eye exhibits no discharge. Left eye exhibits no discharge.  Neck: Normal range of motion. Neck supple.  Negative neck stiffness Negative nuchal rigidity  Cardiovascular: Normal rate, regular rhythm and normal heart sounds.  Exam reveals no friction rub.   No murmur heard. Pulses:      Radial pulses are 2+ on the right side, and 2+ on the left side.       Dorsalis pedis pulses are 2+ on the right side, and 2+ on the left side.  Pulmonary/Chest: Effort normal and breath sounds normal. No respiratory distress. He has no wheezes. He has no rales.  Airway intact Patients who do speak  in full sentences without difficulty Negative stridor   Abdominal: Soft. Bowel sounds are normal. There is no tenderness. There is no guarding.  Abdomen soft, nontender upon palpation  Musculoskeletal: Normal range of motion.  Full ROM to upper and lower extremities without difficulty noted, negative ataxia noted.  Neurological: He is alert and oriented to person, place, and time. No cranial nerve deficit. He exhibits normal muscle tone. Coordination normal.  Cranial nerves III-XII grossly intact Strength 5+/5+ to upper and lower extremities bilaterally with resistance applied, equal distribution noted  sensation intact with differentiation to sharp and dull touch Negative arm drift Negative pronator drift, negative Romberg Fine motor skills intact Heel to knee down shin normal bilaterally Gait proper, proper balance - negative sway, negative drift, negative step-offs  Skin: Skin is warm and dry. No rash noted. He is not diaphoretic. No erythema.  Psychiatric: He has a normal mood and affect. His behavior is normal. Thought content normal.    ED Course  Procedures (including critical care time)  7:38 AM Patient tolerated [place] damage and fluids by mouth without episode of emesis while in ED setting. Patient tolerated food well. Patient has now sobered up.   Results for orders placed during the hospital encounter of 04/15/13  CBC WITH DIFFERENTIAL      Result Value Range   WBC 9.5  4.0 - 10.5 K/uL   RBC 4.40  4.22 - 5.81 MIL/uL   Hemoglobin 14.5  13.0 - 17.0 g/dL   HCT 24.4  01.0 - 27.2 %   MCV 95.9  78.0 - 100.0 fL   MCH 33.0  26.0 - 34.0 pg   MCHC 34.4  30.0 - 36.0 g/dL   RDW 53.6  64.4 - 03.4 %   Platelets 437 (*) 150 - 400 K/uL   Neutrophils Relative % 50  43 - 77 %   Lymphocytes Relative 31  12 - 46 %   Monocytes Relative 6  3 - 12 %   Eosinophils Relative 12 (*) 0 - 5 %   Basophils Relative 1  0 - 1 %   Neutro Abs 4.8  1.7 - 7.7 K/uL   Lymphs Abs 2.9  0.7 - 4.0 K/uL    Monocytes Absolute 0.6  0.1 - 1.0 K/uL   Eosinophils Absolute 1.1 (*) 0.0 - 0.7 K/uL   Basophils Absolute 0.1  0.0 - 0.1 K/uL   WBC Morphology ATYPICAL LYMPHOCYTES     Smear Review LARGE PLATELETS PRESENT    COMPREHENSIVE METABOLIC PANEL      Result Value Range   Sodium 134 (*) 137 - 147 mEq/L   Potassium 3.9  3.7 - 5.3 mEq/L   Chloride 96  96 - 112 mEq/L   CO2 19  19 - 32 mEq/L   Glucose, Bld 90  70 - 99 mg/dL   BUN 10  6 - 23 mg/dL   Creatinine, Ser 7.42  0.50 - 1.35 mg/dL   Calcium 9.2  8.4 - 59.5 mg/dL   Total Protein 8.3  6.0 - 8.3  g/dL   Albumin 4.1  3.5 - 5.2 g/dL   AST 23  0 - 37 U/L   ALT 19  0 - 53 U/L   Alkaline Phosphatase 85  39 - 117 U/L   Total Bilirubin <0.2 (*) 0.3 - 1.2 mg/dL   GFR calc non Af Amer >90  >90 mL/min   GFR calc Af Amer >90  >90 mL/min  LIPASE, BLOOD      Result Value Range   Lipase 46  11 - 59 U/L  URINALYSIS, ROUTINE W REFLEX MICROSCOPIC      Result Value Range   Color, Urine YELLOW  YELLOW   APPearance CLEAR  CLEAR   Specific Gravity, Urine 1.007  1.005 - 1.030   pH 5.5  5.0 - 8.0   Glucose, UA NEGATIVE  NEGATIVE mg/dL   Hgb urine dipstick NEGATIVE  NEGATIVE   Bilirubin Urine NEGATIVE  NEGATIVE   Ketones, ur NEGATIVE  NEGATIVE mg/dL   Protein, ur NEGATIVE  NEGATIVE mg/dL   Urobilinogen, UA 0.2  0.0 - 1.0 mg/dL   Nitrite NEGATIVE  NEGATIVE   Leukocytes, UA NEGATIVE  NEGATIVE  ETHANOL      Result Value Range   Alcohol, Ethyl (B) <11  0 - 11 mg/dL    Labs Review Labs Reviewed  CBC WITH DIFFERENTIAL - Abnormal; Notable for the following:    Platelets 437 (*)    Eosinophils Relative 12 (*)    Eosinophils Absolute 1.1 (*)    All other components within normal limits  COMPREHENSIVE METABOLIC PANEL - Abnormal; Notable for the following:    Sodium 134 (*)    Total Bilirubin <0.2 (*)    All other components within normal limits  LIPASE, BLOOD  URINALYSIS, ROUTINE W REFLEX MICROSCOPIC  ETHANOL   Imaging Review No results  found.  EKG Interpretation   None       MDM   1. Alcohol intoxication   2. Abdominal pain    Medications - No data to display  Filed Vitals:   04/15/13 0107 04/15/13 0435  BP: 110/73 111/66  Pulse: 88 101  Temp: 97.7 F (36.5 C) 98.1 F (36.7 C)  TempSrc: Oral Oral  Resp: 16 16  Height: 5\' 8"  (1.727 m)   Weight: 160 lb (72.576 kg)   SpO2: 97% 97%    Patient presenting to emergency department with abdominal pain. Patient was [facility] by [facility], due to patient calling EMS regarding his abdominal pain that is described as "felt real bad." Stated that he drank one 40 ounce beer-reported that he did not eat yesterday. Stated he was just released from hospital for alcohol detox last Thursday. This provider since patient-reported that his abdominal pain is a longer present, reported that he feels better. Denied HI, SI, hallucinations. Does not desire to go back to detox at this time. Patient has a long psych history-schizophrenia, bipolar disorder, depression. Alert and oriented. GCS 15. Heart rate and rhythm normal. Lungs clear to auscultation to upper lower lobes bilaterally. Radial pulses and DP pulses 2+ bilaterally. Bowel sounds normoactive in all 4 quadrants, soft, nontender upon palpation-negative acute abdomen, negative peritoneal signs-nonsurgical abdomen identified. Full range of motion to upper lower extremities bilaterally without difficulty noted or ataxia. Negative asterixis. Cranial nerves grossly intact. Strength intact with equal distribution-equal grip strength. Sensation intact. Gait proper, negative sway gait proper balance noted. Patient neurologically intact. Urinalysis negative findings for infection-negative nitrites or leukocytosis identified. CBC negative findings. CMP negative  findings. Lipase negative elevation. Alcohol less than 11.  Doubt acute abdominal processes. Doubt DTs. Benign abdominal exam noted. Patient tolerated food and fluids by  mouth-negative episodes of emesis while in ED setting. Patient neurovascularly intact. Patient stable, afebrile. Discharged patient. Referred patient to urgent care Center. Discussed with patient to closely monitor symptoms. Discussed with patient that when he is ready for detox or feels extremely sad to report back to the ED. Discussed with patient to closely monitor symptoms and if symptoms are to worsen or change to report back to the ED - strict return instructions given.  Patient agreed to plan of care, understood, all questions answered.   Raymon MuttonMarissa Demonta Wombles, PA-C 04/16/13 1630

## 2013-04-15 NOTE — Discharge Instructions (Signed)
Please avoid alcohol for this can lead to stomach and liver problems Please rest and stay hydrated Please avoid foods high in fat and grease for this can upset the stomach Please continue to take at home medication as prescribed When ready for alcohol detox please report back to the ED Please continue to monitor symptoms and if symptoms are to worsen or change (fever greater than 101, chills, neck pain, neck stiffness, chest pain, shortness of breath, difficulty breathing, stomach pain, nausea, vomiting, weakness, numbness, tingling, inability to swallow, inability to walk) please report back to the ED immediately   Alcohol Intoxication Alcohol intoxication occurs when the amount of alcohol that a person has consumed impairs his or her ability to mentally and physically function. Alcohol directly impairs the normal chemical activity of the brain. Drinking large amounts of alcohol can lead to changes in mental function and behavior, and it can cause many physical effects that can be harmful.  Alcohol intoxication can range in severity from mild to very severe. Various factors can affect the level of intoxication that occurs, such as the person's age, gender, weight, frequency of alcohol consumption, and the presence of other medical conditions (such as diabetes, seizures, or heart conditions). Dangerous levels of alcohol intoxication may occur when people drink large amounts of alcohol in a short period (binge drinking). Alcohol can also be especially dangerous when combined with certain prescription medicines or "recreational" drugs. SIGNS AND SYMPTOMS Some common signs and symptoms of mild alcohol intoxication include:  Loss of coordination.  Changes in mood and behavior.  Impaired judgment.  Slurred speech. As alcohol intoxication progresses to more severe levels, other signs and symptoms will appear. These may include:  Vomiting.  Confusion and impaired memory.  Slowed  breathing.  Seizures.  Loss of consciousness. DIAGNOSIS  Your health care provider will take a medical history and perform a physical exam. You will be asked about the amount and type of alcohol you have consumed. Blood tests will be done to measure the concentration of alcohol in your blood. In many places, your blood alcohol level must be lower than 80 mg/dL (1.61%) to legally drive. However, many dangerous effects of alcohol can occur at much lower levels.  TREATMENT  People with alcohol intoxication often do not require treatment. Most of the effects of alcohol intoxication are temporary, and they go away as the alcohol naturally leaves the body. Your health care provider will monitor your condition until you are stable enough to go home. Fluids are sometimes given through an IV access tube to help prevent dehydration.  HOME CARE INSTRUCTIONS  Do not drive after drinking alcohol.  Stay hydrated. Drink enough water and fluids to keep your urine clear or pale yellow. Avoid caffeine.   Only take over-the-counter or prescription medicines as directed by your health care provider.  SEEK MEDICAL CARE IF:   You have persistent vomiting.   You do not feel better after a few days.  You have frequent alcohol intoxication. Your health care provider can help determine if you should see a substance use treatment counselor. SEEK IMMEDIATE MEDICAL CARE IF:   You become shaky or tremble when you try to stop drinking.   You shake uncontrollably (seizure).   You throw up (vomit) blood. This may be bright red or may look like black coffee grounds.   You have blood in your stool. This may be bright red or may appear as a black, tarry, bad smelling stool.   You become lightheaded  or faint.  MAKE SURE YOU:   Understand these instructions.  Will watch your condition.  Will get help right away if you are not doing well or get worse. Document Released: 12/18/2004 Document Revised:  11/10/2012 Document Reviewed: 08/13/2012 [facility] Patient Information 2014 [place], [place].  Abdominal Pain, Adult Many things can cause abdominal pain. Usually, abdominal pain is not caused by a disease and will improve without treatment. It can often be observed and treated at home. Your health care provider will do a physical exam and possibly order blood tests and X-rays to help determine the seriousness of your pain. However, in many cases, more time must pass before a clear cause of the pain can be found. Before that point, your health care provider may not know if you need more testing or further treatment. HOME CARE INSTRUCTIONS  Monitor your abdominal pain for any changes. The following actions may help to alleviate any discomfort you are experiencing:  Only take over-the-counter or prescription medicines as directed by your health care provider.  Do not take laxatives unless directed to do so by your health care provider.  Try a clear liquid diet (broth, tea, or water) as directed by your health care provider. Slowly move to a bland diet as tolerated. SEEK MEDICAL CARE IF:  You have unexplained abdominal pain.  You have abdominal pain associated with nausea or diarrhea.  You have pain when you urinate or have a bowel movement.  You experience abdominal pain that wakes you in the night.  You have abdominal pain that is worsened or improved by eating food.  You have abdominal pain that is worsened with eating fatty foods. SEEK IMMEDIATE MEDICAL CARE IF:   Your pain does not go away within 2 hours.  You have a fever.  You keep throwing up (vomiting).  Your pain is felt only in portions of the abdomen, such as the right side or the left lower portion of the abdomen.  You pass bloody or black tarry stools. MAKE SURE YOU:  Understand these instructions.   Will watch your condition.   Will get help right away if you are not doing well or get worse.  Document  Released: 12/18/2004 Document Revised: 12/29/2012 Document Reviewed: 11/17/2012 [facility] Patient Information 2014 [place], [place].   Emergency Department Resource Guide 1) Find a Doctor and Pay Out of Pocket Although you won't have to find out who is covered by your insurance plan, it is a good idea to ask around and get recommendations. You will then need to call the office and see if the doctor you have chosen will accept you as a new patient and what types of options they offer for patients who are self-pay. Some doctors offer discounts or will set up payment plans for their patients who do not have insurance, but you will need to ask so you aren't surprised when you get to your appointment.  2) Contact Your Local Health Department Not all health departments have doctors that can see patients for sick visits, but many do, so it is worth a call to see if yours does. If you don't know where your local health department is, you can check in your phone book. The CDC also has a tool to help you locate your state's health department, and many state websites also have listings of all of their local health departments.  3) Find a Walk-in Clinic If your illness is not likely to be very severe or complicated, you may want to  try a walk in clinic. These are popping up all over the country in pharmacies, drugstores, and shopping centers. They're usually staffed by nurse practitioners or physician assistants that have been trained to treat common illnesses and complaints. They're usually fairly quick and inexpensive. However, if you have serious medical issues or chronic medical problems, these are probably not your best option.  No Primary Care Doctor: - Call Health Connect at  [phone] - they can help you locate a primary care doctor that  accepts your insurance, provides certain services, etc. - Physician Referral Service- [phone]  Chronic Pain Problems: Organization         Address  Phone    Notes  [facility]  [phone]) [phone] Patients need to be referred by their primary care doctor.   Medication Assistance: Organization         Address  Phone   Notes  [facility] Medication [facility] [address] [place], [place] [address] [phone]) [phone] --Must be a resident of [facility] -- Must have NO insurance coverage whatsoever (no Medicaid/ Medicare, etc.) -- The pt. MUST have a primary care doctor that directs their care regularly and follows them in the community   MedAssist  [phone]   [employer]  [phone]    Agencies that provide inexpensive medical care: Organization         Address  Phone   Notes  [facility]  [phone]   [facility]    [phone]   [facility] [address] [place], [place] [address] [phone]) [phone]   [facility] [address], [place] [phone]) [phone]   Planned Parenthood    [phone]) [phone]   [facility]    [phone](336) [phone]   [facility]  [address], [place] Phone:  [phone], Fax:  [phone](336) [phone] Hours of Operation:  9 am - 6 pm, M-F.  Also accepts Medicaid/Medicare and self-pay.  [facility]  [address],  [place] Phone: [phone](336) [phone], Fax: [phone]) [phone]. Hours of Operation:  8:30 am - 5:30 pm, M-F.  Also accepts Medicaid and self-pay.  [facility] [place] [address], [place] Phone: [phone]) [phone]   [facility] [address], [place], [place] [phone], Ext. [phone] Mondays & Thursdays: 7-9 AM.  First 15 patients are seen on a first come, first serve basis.    Medicaid-accepting [facility] Providers:  Organization         Address  Phone   Notes  [facility] [address], [place] [phone] Also accepts self-pay patients.  [facility]  [address], [place]  [phone]) [phone]   [facility] [address], [place] [phone]) [phone]   [facility] [address] [place] [address], [place] [phone]) [phone]   Renaye RakersVeita Bland [address], [place]   [phone] Only accepts [place] Access [place] patients after they have their name applied to their card.   Self-Pay (no insurance) in [facility]:  Organization         Address  Phone   Notes  Sickle Cell Patients, [facility] [address], [place] [phone]) [phone]   [facility] [address], [place] [phone]  [facility] [address], [place] [phone]   [facility]/Dr. Osei-Bonsu  [address], [place] or [address], [place] [phone] Phone number for both [place] and [place] locations is the same.  [facility] [address] [place] [phone]   [facility] [address] [place] or [address] [phone]   [facility] [address] [place] [phone], phone; [phone], fax Sees patients 1st and 3rd Saturday of every month.  Must not qualify for public or private insurance (i.e. Medicaid, Medicare, [place] Health Choice, Veterans' Benefits)  Household income should be no more than 200% of the poverty level The clinic cannot treat you if you are pregnant or think you are pregnant  Sexually transmitted diseases are not treated at the clinic.    Dental Care: Organization         Address  Phone  Notes  [facility] [place] [phone] Accepts children up to age 21 who are enrolled in [place] or [place]; pregnant women with a Medicaid card; and children who have  applied for Medicaid or [place] Health Choice, but were declined, whose parents can pay a reduced fee at time of service.  [facility] Department of [facility]  [address], [place] [phone] Accepts children up to age 40 who are enrolled in [place] or [place]; pregnant women with a Medicaid card; and children who have applied for Medicaid or [place] Health Choice, but were declined, whose parents can pay a reduced fee at time of service.  [place] Adult Dental Access PROGRAM  [place] [phone] Patients are seen by appointment only. Walk-ins are not accepted. [place] will see patients 66 years of age and older. Monday - Tuesday (8am-5pm) Most Wednesdays (8:30-5pm) $30 per visit, cash only  [facility] Adult Dental Access PROGRAM  [address], [facility] [phone] Patients are seen by appointment only. Walk-ins are not accepted. [place] will see patients 43 years of age and older. One Wednesday Evening (Monthly: Volunteer Based).  $30 per visit, cash only  [place]  [phone] for adults; Children under age 38, call Graduate Pediatric Dentistry at [phone]. Children aged 31-14, please call [phone] to request a pediatric application.  Dental services are provided in all areas of dental care including fillings, crowns and bridges, complete and partial dentures, implants, gum treatment, root canals, and extractions. Preventive care is also provided. Treatment is provided to both adults and children. Patients are selected via a lottery and there is often a waiting list.   [facility] [address] [place]  [phone] [email or web address]   [facility] [address] [place], [place] [phone], Ext. [phone] Second and Fourth Thursday of each month, opens at 6:30 AM; Clinic ends at 9 AM.  Patients are seen on a first-come first-served basis, and a  limited number are seen during each clinic.   [facility]  [address] Hillard Danker [place], [place] [phone]   Eligibility Requirements You must have lived in [place], [place], or [place] counties for at least the last three months.   You cannot be eligible for state or federal sponsored  healthcare insurance, including [employer], [place], or [employer].   You generally cannot be eligible for healthcare insurance through your employer.    How to apply: Eligibility screenings are held every Tuesday and Wednesday afternoon from 1:00 pm until 4:00 pm. You do not need an appointment for the interview!  [facility] [address] [place], [place]   [place]  [place] Department  [place]  [phone]    Behavioral Health Resources in the Community: Intensive Outpatient Programs Organization         Address  Phone  Notes  [place]. [address] [place], [place] [phone]   [facility] [address], [place], [place]   ADS: [facility] Drug S[facility] [address], [place], [place]   [place] [address]  [place], [place] or [phone]   Substance Abuse Resources Organization         Address  Phone  Notes  Alcohol and Drug Services  [phone]   [place]  [phone]   The [place]   Chinita Pester  [phone]   Residential & Outpatient Substance Abuse Program  [phone]   Psychological Services Organization         Address  Phone  Notes  [facility] [place]  [place]  [phone]   [place] [address] [place] or [phone]    Mobile Crisis Teams Organization          Address  Phone  Notes  [facility], Mobile Crisis Care Unit  [phone]   [facility]  [address] [place], [place]   [place] Levels [address], [place] [phone]    Self-Help/Support Groups Organization         Address  Phone             Notes  [place]. of [place] - variety of support groups  [place] Call for more information  Narcotics Anonymous (NA), [facility]aring [facility] [address], [employer] [place]  2 meetings at this location   Special educational needs teacher         Address  Phone  Notes  [facility]AP Residential Treatment [place],    [place]  [phone]   [facility]  [address], [place] [id number], [place], [place]   [place], [place] [phone] Admissions: 8am-3pm M-F  Incentives Substance [place] [address]    [place], [place] [phone]   [facility] [address] [place], [place], [place]   [facility] [address]  [place], [place]   Insight Programs - Intensive Outpatient [address], [place], [place]   [facility] ([place].,  [place], [place] [phone] or [phone]   [facility] Services (RTS) [address] [place], [place] Accepts Medicaid  [facility] [place] [address],  [place] [phone] Substance Abuse/Addiction Treatment   [facility] Organization         Address  Phone  Notes  [facility]  [phone]   Domenic Schwab, PhD [address] [place], [place]   [phone] or (  [place]) [phone]   [facility]   [address] [place], [place] [phone]   [address], [place], [place] [phone] Insurance/Medicaid/sponsorship  through [facility] and Families [address] [place], [place] [phone] [place] [address]   [place], [place] [phone]    Dr. Adele Schilder  [phone]   [facility] Dept. 1) [address] [place] 2) [place] 3)  [place] [address], [place] [phone]  [phone]   [place] [phone] or [phone] (After Hours)

## 2013-04-28 NOTE — ED Provider Notes (Signed)
Medical screening examination/treatment/procedure(s) were performed by non-physician practitioner and as supervising physician I was immediately available for consultation/collaboration.  EKG Interpretation   None         Zenya Hickam, MD 04/28/13 0117 

## 2013-05-05 ENCOUNTER — Emergency Department (HOSPITAL_COMMUNITY)
Admission: EM | Admit: 2013-05-05 | Discharge: 2013-05-06 | Disposition: A | Discharge status: Home / Self Care | Payer: Self-pay | Attending: Emergency Medicine | Admitting: Emergency Medicine

## 2013-05-05 ENCOUNTER — Encounter (HOSPITAL_COMMUNITY): Payer: Self-pay | Admitting: Emergency Medicine

## 2013-05-05 DIAGNOSIS — K029 Dental caries, unspecified: Secondary | ICD-10-CM | POA: Insufficient documentation

## 2013-05-05 DIAGNOSIS — R1084 Generalized abdominal pain: Secondary | ICD-10-CM | POA: Insufficient documentation

## 2013-05-05 DIAGNOSIS — F1010 Alcohol abuse, uncomplicated: Secondary | ICD-10-CM

## 2013-05-05 DIAGNOSIS — F3130 Bipolar disorder, current episode depressed, mild or moderate severity, unspecified: Secondary | ICD-10-CM | POA: Insufficient documentation

## 2013-05-05 DIAGNOSIS — F1020 Alcohol dependence, uncomplicated: Secondary | ICD-10-CM

## 2013-05-05 DIAGNOSIS — Z79899 Other long term (current) drug therapy: Secondary | ICD-10-CM | POA: Insufficient documentation

## 2013-05-05 DIAGNOSIS — F209 Schizophrenia, unspecified: Secondary | ICD-10-CM | POA: Insufficient documentation

## 2013-05-05 DIAGNOSIS — F17200 Nicotine dependence, unspecified, uncomplicated: Secondary | ICD-10-CM | POA: Insufficient documentation

## 2013-05-05 DIAGNOSIS — F10229 Alcohol dependence with intoxication, unspecified: Secondary | ICD-10-CM | POA: Insufficient documentation

## 2013-05-05 DIAGNOSIS — K006 Disturbances in tooth eruption: Secondary | ICD-10-CM | POA: Insufficient documentation

## 2013-05-05 LAB — RAPID URINE DRUG SCREEN, HOSP PERFORMED
Amphetamines: NOT DETECTED
Barbiturates: NOT DETECTED
Benzodiazepines: NOT DETECTED
COCAINE: NOT DETECTED
Opiates: NOT DETECTED
Tetrahydrocannabinol: NOT DETECTED

## 2013-05-05 LAB — COMPREHENSIVE METABOLIC PANEL
ALT: 11 U/L (ref 0–53)
AST: 18 U/L (ref 0–37)
Albumin: 3.8 g/dL (ref 3.5–5.2)
Alkaline Phosphatase: 70 U/L (ref 39–117)
BUN: 8 mg/dL (ref 6–23)
CALCIUM: 9.4 mg/dL (ref 8.4–10.5)
CO2: 24 meq/L (ref 19–32)
Chloride: 100 mEq/L (ref 96–112)
Creatinine, Ser: 0.65 mg/dL (ref 0.50–1.35)
GLUCOSE: 93 mg/dL (ref 70–99)
Potassium: 4.3 mEq/L (ref 3.7–5.3)
SODIUM: 139 meq/L (ref 137–147)
TOTAL PROTEIN: 7.9 g/dL (ref 6.0–8.3)
Total Bilirubin: <0.2 mg/dL — ABNORMAL LOW (ref 0.3–1.2)

## 2013-05-05 LAB — CBC
HCT: 40.4 % (ref 39.0–52.0)
Hemoglobin: 14 g/dL (ref 13.0–17.0)
MCH: 33.2 pg (ref 26.0–34.0)
MCHC: 34.7 g/dL (ref 30.0–36.0)
MCV: 95.7 fL (ref 78.0–100.0)
PLATELETS: 351 10*3/uL (ref 150–400)
RBC: 4.22 MIL/uL (ref 4.22–5.81)
RDW: 12.2 % (ref 11.5–15.5)
WBC: 9.5 10*3/uL (ref 4.0–10.5)

## 2013-05-05 LAB — ETHANOL: Alcohol, Ethyl (B): 105 mg/dL — ABNORMAL HIGH (ref 0–11)

## 2013-05-05 MED ORDER — ALUM & MAG HYDROXIDE-SIMETH 200-200-20 MG/5ML PO SUSP: 30.0000 mL | ORAL | Status: DC | PRN

## 2013-05-05 MED ORDER — LORAZEPAM 1 MG PO TABS: 1.0000 mg | ORAL_TABLET | Freq: Three times a day (TID) | ORAL | Status: DC | PRN

## 2013-05-05 MED ORDER — IBUPROFEN 200 MG PO TABS: 600.0000 mg | ORAL_TABLET | Freq: Three times a day (TID) | ORAL | Status: DC | PRN

## 2013-05-05 MED ORDER — THIAMINE HCL 100 MG/ML IJ SOLN: 100.0000 mg | Freq: Every day | INTRAMUSCULAR | Status: DC

## 2013-05-05 MED ORDER — ONDANSETRON HCL 4 MG PO TABS: 4.0000 mg | ORAL_TABLET | Freq: Three times a day (TID) | ORAL | Status: DC | PRN

## 2013-05-05 MED ORDER — VITAMIN B-1 100 MG PO TABS
100.0000 mg | ORAL_TABLET | Freq: Every day | ORAL | Status: DC
  Administered 2013-05-06: 100 mg via ORAL
  Filled 2013-05-05: qty 1

## 2013-05-05 MED ORDER — ZOLPIDEM TARTRATE 5 MG PO TABS: 5.0000 mg | ORAL_TABLET | Freq: Every evening | ORAL | Status: DC | PRN

## 2013-05-05 MED ORDER — NICOTINE 21 MG/24HR TD PT24
21.0000 mg | MEDICATED_PATCH | Freq: Every day | TRANSDERMAL | Status: DC
  Administered 2013-05-06: 21 mg via TRANSDERMAL
  Filled 2013-05-05: qty 1

## 2013-05-05 NOTE — ED Notes (Addendum)
Pt. In new blue scrubs. Pt. And belongings searched and wanded by security. Pt. Has 3 belongings bags. Pt. Has pant, jacket, boots, socks, thermal pants,belt, sweat pants, flannel jacket,sweat shirt, cigarettes, razor. Pt. Belongings locked up in [facility] near the psy-ed.

## 2013-05-05 NOTE — ED Notes (Signed)
Per PTAR  Pt is here for detox from alcohol, beer no liquor  Pt states he has abd pain as well  Pt is cold and homeless

## 2013-05-05 NOTE — ED Provider Notes (Signed)
CSN: [id number]     Arrival date & time 05/05/13  2007 History  This chart was scribed for non-physician practitioner Johnnette Gourdobyn Albert, PA-C working with Audree CamelScott T Goldston, MD by Joaquin MusicKristina Sanchez-Matthews, ED Scribe. This patient was seen in room WTR4/WLPT4 and the patient's care was started at 8:58 PM .   Chief Complaint  Patient presents with  . detox   . Abdominal Pain   The history is provided by the patient. No language interpreter was used.  HPI Comments: Jimmy [name] is a 58 y.o. male who presents to the Emergency Department complaining of generalized abdominal pain and detox. Pt states he would like detox from alcohol. He states he drinks "about 7-8 40oz a day". Pt states he generally drinks due to being depressed.He states he has gone through detox "a few years ago". Pt denies SI and HI. Pt denies hallucinations. Pt denies drug use today. Pt denies nausea, vomiting, and diarrhea.  Pt also complains of dental pain.   Past Medical History  Diagnosis Date  . Alcohol abuse   . Schizophrenia   . Bipolar 1 disorder   . Mental disorder   . Depression    Past Surgical History  Procedure Laterality Date  . Skin graft     No family history on file. History  Substance Use Topics  . Smoking status: Current Every Day Smoker -- 2.00 packs/day for 30 years    Types: Cigarettes  . Smokeless tobacco: Not on file  . Alcohol Use: 12.6 oz/week    21 Cans of beer per week     Comment: drinks as much etoh as he can get per day    Review of Systems A complete 10 system review of systems was obtained and all systems are negative except as noted in the HPI and PMH.   Allergies  Review of patient's allergies indicates no known allergies.  Home Medications   Current Outpatient Rx  Name  Route  Sig  Dispense  Refill  . lurasidone (LATUDA) 40 MG TABS tablet   Oral   Take 40 mg by mouth daily with breakfast.          BP 137/72  Pulse 92  Temp(Src) 98.3 F (36.8 C) (Oral)  Resp 18   SpO2 100%  Physical Exam  Nursing note and vitals reviewed. Constitutional: He is oriented to person, place, and time. He appears well-developed and well-nourished. No distress.  HENT:  Head: Normocephalic and atraumatic.  Poor dentiion. Multiple dental carries and tooth decay.  Eyes: Conjunctivae and EOM are normal.  Neck: Normal range of motion. Neck supple.  Cardiovascular: Normal rate, regular rhythm and normal heart sounds.   Pulmonary/Chest: Effort normal and breath sounds normal.  Abdominal: Soft. There is no tenderness.  Musculoskeletal: Normal range of motion. He exhibits no edema.  Neurological: He is alert and oriented to person, place, and time.  Skin: Skin is warm and dry.  Psychiatric: He is withdrawn. He exhibits a depressed mood. He expresses no homicidal and no suicidal ideation.  Depressed.    ED Course  Procedures DIAGNOSTIC STUDIES: Oxygen Saturation is 100% on RA, normal by my interpretation.    COORDINATION OF CARE: 9:03 PM-Discussed treatment plan which includes further evaluation/detox. Pt agreed to plan.   Labs Review Labs Reviewed  COMPREHENSIVE METABOLIC PANEL - Abnormal; Notable for the following:    Total Bilirubin <0.2 (*)    All other components within normal limits  ETHANOL - Abnormal; Notable for the following:  Alcohol, Ethyl (B) 105 (*)    All other components within normal limits  CBC  URINE RAPID DRUG SCREEN (HOSP PERFORMED)   Imaging Review No results found.  EKG Interpretation   None      MDM   Final diagnoses:  Alcohol abuse   TTS consult pending. Pt medically clear.   I personally performed the services described in this documentation, which was scribed in my presence. The recorded information has been reviewed and is accurate.    Trevor Mace, PA-C 05/06/13 [phone]

## 2013-05-06 DIAGNOSIS — F1020 Alcohol dependence, uncomplicated: Secondary | ICD-10-CM

## 2013-05-06 DIAGNOSIS — F1010 Alcohol abuse, uncomplicated: Secondary | ICD-10-CM

## 2013-05-06 NOTE — Discharge Instructions (Signed)
Finding Treatment for Alcohol and Drug Addiction  It can be hard to find the right place to get professional treatment. Here are some important things to consider:   There are different types of treatment to choose from.   Some programs are live-in (residential) while others are not (outpatient). Sometimes a combination is offered.   No single type of program is right for everyone.   Most treatment programs involve a combination of education, counseling, and a 12-step, spiritually-based approach.   There are non-spiritually based programs (not 12-step).   Some treatment programs are government sponsored. They are geared for patients without private insurance.   Treatment programs can vary in many respects such as:   Cost and types of insurance accepted.   Types of on-site medical services offered.   Length of stay, setting, and size.   Overall philosophy of treatment.  A person may need specialized treatment or have needs not addressed by all programs. For example, adolescents need treatment appropriate for their age. Other people have secondary disorders that must be managed as well. Secondary conditions can include mental illness, such as depression or diabetes. Often, a period of detoxification from alcohol or drugs is needed. This requires medical supervision and not all programs offer this.  THINGS TO CONSIDER WHEN SELECTING A TREATMENT PROGRAM    Is the program certified by the appropriate government agency? Even private programs must be certified and employ certified professionals.   Does the program accept your insurance? If not, can a payment plan be set up?   Is the facility clean, organized, and well run? Do they allow you to speak with graduates who can share their treatment experience with you? Can you tour the facility? Can you meet with staff?   Does the program meet the full range of individual needs?   Does the treatment program address sexual orientation and physical disabilities?  Do they provide age, gender, and culturally appropriate treatment services?   Is treatment available in languages other than English?   Is long-term aftercare support or guidance encouraged and provided?   Is assessment of an individual's treatment plan ongoing to ensure it meets changing needs?   Does the program use strategies to encourage reluctant patients to remain in treatment long enough to increase the likelihood of success?   Does the program offer counseling (individual or group) and other behavioral therapies?   Does the program offer medicine as part of the treatment regimen, if needed?   Is there ongoing monitoring of possible relapse? Is there a defined relapse prevention program? Are services or referrals offered to family members to ensure they understand addiction and the recovery process? This would help them support the recovering individual.   Are 12-step meetings held at the center or is transport available for patients to attend outside meetings?  In countries outside of the [place], see local directories for contact information for services in your area.  Document Released: 02/06/2005 Document Revised: 06/02/2011 Document Reviewed: 08/19/2007  [employer] Patient Information 2014 [employer].

## 2013-05-06 NOTE — ED Provider Notes (Signed)
Medical screening examination/treatment/procedure(s) were performed by non-physician practitioner and as supervising physician I was immediately available for consultation/collaboration.  EKG Interpretation   None         Jobanny Mavis T Kendyn Zaman, MD 05/06/13 1853 

## 2013-05-06 NOTE — Consult Note (Signed)
[facility] Consult   Reason for Consult:  Drinking alcohol Referring Physician:  ED  Jimmy [name] is an 58 y.o. male. Total Time spent with patient: 45 minutes  Assessment: AXIS I:  Substance Induced Mood Disorder and Alcohol use disorder, severe AXIS II:  Deferred AXIS III:   Past Medical History  Diagnosis Date  . Alcohol abuse   . Schizophrenia   . Bipolar 1 disorder   . Mental disorder   . Depression    AXIS IV:  housing problems, other psychosocial or environmental problems, problems related to social environment and problems with access to health care services AXIS V:  51-60 moderate symptoms  Plan:  No evidence of imminent risk to self or others at present.   Patient does not meet criteria for psychiatric inpatient admission. Supportive therapy provided about ongoing stressors. Discussed crisis plan, support from social network, calling 911, coming to the Emergency Department, and calling Suicide Hotline. Information for [facility] and other Facilities provided  Subjective:   Jimmy [name] is a 58 y.o. male patient admitted with drinking alcohol, being homeless and depressed.  HPI:  Pt came to [facility] according to [place] because he wanted detox. Was concerned about being up in cold outside, says he is homeless. Has been drinking six to eight 40oz beers daily.Patient has been in [place] recently. Patient has no SI, HI or A/V hallucinations at this time. UDS is clear.  Patient has been feeling calm and vitals are stable.   HPI Elements:   Location:  alcohol. Quality:  recurrent. Severity:  moderate.  Past Psychiatric History: Past Medical History  Diagnosis Date  . Alcohol abuse   . Schizophrenia   . Bipolar 1 disorder   . Mental disorder   . Depression     reports that he has been smoking Cigarettes.  He has a 60 pack-year smoking history. He does not have any smokeless tobacco history on file. He reports that he drinks about 12.6 ounces of  alcohol per week. He reports that he uses illicit drugs (Cocaine and Marijuana). No family history on file. Family History Substance Abuse: Yes, Describe: (ETOH runs in family.) Family Supports: Yes, List: (Two brothers in [place]) Living Arrangements: Other (Comment) (Homeless) Can pt return to current living arrangement?: Yes Abuse/Neglect [facility]) Physical Abuse: Denies Verbal Abuse: Denies Sexual Abuse: Denies Allergies:  No Known Allergies  ACT Assessment Complete:  Yes:    Educational Status    Risk to Self: Risk to self Suicidal Ideation: No-Not Currently/Within Last 6 Months Suicidal Intent: No-Not Currently/Within Last 6 Months Is patient at risk for suicide?: No Suicidal Plan?: No-Not Currently/Within Last 6 Months Specify Current Suicidal Plan: NOne Access to Means: No What has been your use of drugs/alcohol within the last 12 months?: Drinking for last 4 days Previous Attempts/Gestures: Yes How many times?: 1 Other Self Harm Risks: N/A Triggers for Past Attempts: Other (Comment) (Intoxication) Intentional Self Injurious Behavior: None Family Suicide History: Yes (Paternal GGF committed suicide) Recent stressful life event(s): Conflict (Comment);Other (Comment) (Homeless & family conflict) Persecutory voices/beliefs?: No Depression: Yes Depression Symptoms:  (Pt denied any depressive symptoms) Substance abuse history and/or treatment for substance abuse?: Yes Suicide prevention information given to non-admitted patients: Not applicable  Risk to Others: Risk to Others Homicidal Ideation: No-Not Currently/Within Last 6 Months Thoughts of Harm to Others: No-Not Currently Present/Within Last 6 Months Current Homicidal Plan: No-Not Currently/Within Last 6 Months Describe Current Homicidal Plan: None Access to Homicidal  Means: No Identified Victim: No one History of harm to others?: No Assessment of Violence: None Noted Violent Behavior Description: Denies Does  patient have access to weapons?: No Criminal Charges Pending?: Yes Describe Pending Criminal Charges: Assult w/ deadly weapon/ drunk & disorderly Does patient have a court date: Yes Court Date: 06/03/13  Abuse: Abuse/Neglect Assessment (Assessment to be complete while patient is alone) Physical Abuse: Denies Verbal Abuse: Denies Sexual Abuse: Denies Exploitation of patient/patient's resources: Denies Self-Neglect: Denies  Prior Inpatient Therapy: Prior Inpatient Therapy Prior Inpatient Therapy: Yes Prior Therapy Dates: 2013, 2006 Prior Therapy Facilty/Provider(s): [facility], [place], [place] Reason for Treatment: Detox, [place]   Prior Outpatient Therapy: Prior Outpatient Therapy Prior Outpatient Therapy: Yes Prior Therapy Dates: 2007 Prior Therapy Facilty/Provider(s): [facility] Reason for Treatment: bipolar disorder  Additional Information: Additional Information 1:1 In Past 12 Months?: No CIRT Risk: No Elopement Risk: No Does patient have medical clearance?: Yes                  Objective: Blood pressure 114/76, pulse 67, temperature 98.1 F (36.7 C), temperature source Oral, resp. rate 18, SpO2 100.00%.There is no weight on file to calculate BMI. Results for orders placed during the hospital encounter of 05/05/13 (from the past 72 hour(s))  URINE RAPID DRUG SCREEN (HOSP PERFORMED)     Status: None   Collection Time    05/05/13  9:13 PM      Result Value Ref Range   Opiates NONE DETECTED  NONE DETECTED   Cocaine NONE DETECTED  NONE DETECTED   Benzodiazepines NONE DETECTED  NONE DETECTED   Amphetamines NONE DETECTED  NONE DETECTED   Tetrahydrocannabinol NONE DETECTED  NONE DETECTED   Barbiturates NONE DETECTED  NONE DETECTED   Comment:            DRUG SCREEN FOR MEDICAL PURPOSES     ONLY.  IF CONFIRMATION IS NEEDED     FOR ANY PURPOSE, NOTIFY LAB     WITHIN 5 DAYS.                LOWEST DETECTABLE LIMITS     FOR URINE  DRUG SCREEN     Drug Class       Cutoff (ng/mL)     Amphetamine      1000     Barbiturate      200     Benzodiazepine   161     Tricyclics       096     Opiates          300     Cocaine          300     THC              50  CBC     Status: None   Collection Time    05/05/13  9:19 PM      Result Value Ref Range   WBC 9.5  4.0 - 10.5 K/uL   RBC 4.22  4.22 - 5.81 MIL/uL   Hemoglobin 14.0  13.0 - 17.0 g/dL   HCT 40.4  39.0 - 52.0 %   MCV 95.7  78.0 - 100.0 fL   MCH 33.2  26.0 - 34.0 pg   MCHC 34.7  30.0 - 36.0 g/dL   RDW 12.2  11.5 - 15.5 %   Platelets 351  150 - 400 K/uL  COMPREHENSIVE METABOLIC PANEL     Status: Abnormal   Collection  Time    05/05/13  9:19 PM      Result Value Ref Range   Sodium 139  137 - 147 mEq/L   Potassium 4.3  3.7 - 5.3 mEq/L   Chloride 100  96 - 112 mEq/L   CO2 24  19 - 32 mEq/L   Glucose, Bld 93  70 - 99 mg/dL   BUN 8  6 - 23 mg/dL   Creatinine, Ser 0.65  0.50 - 1.35 mg/dL   Calcium 9.4  8.4 - 10.5 mg/dL   Total Protein 7.9  6.0 - 8.3 g/dL   Albumin 3.8  3.5 - 5.2 g/dL   AST 18  0 - 37 U/L   ALT 11  0 - 53 U/L   Alkaline Phosphatase 70  39 - 117 U/L   Total Bilirubin <0.2 (*) 0.3 - 1.2 mg/dL   GFR calc non Af Amer >90  >90 mL/min   GFR calc Af Amer >90  >90 mL/min   Comment: (NOTE)     The eGFR has been calculated using the CKD EPI equation.     This calculation has not been validated in all clinical situations.     eGFR's persistently <90 mL/min signify possible Chronic Kidney     Disease.  ETHANOL     Status: Abnormal   Collection Time    05/05/13  9:19 PM      Result Value Ref Range   Alcohol, Ethyl (B) 105 (*) 0 - 11 mg/dL   Comment:            LOWEST DETECTABLE LIMIT FOR     SERUM ALCOHOL IS 11 mg/dL     FOR MEDICAL PURPOSES ONLY   Labs are reviewed and are pertinent for Blood alcohol 105 at admission.  Current Facility-Administered Medications  Medication Dose Route Frequency Provider Last Rate Last Dose  . alum & mag  hydroxide-simeth (MAALOX/MYLANTA) 200-200-20 MG/5ML suspension 30 mL  30 mL Oral PRN Illene Labrador, PA-C      . ibuprofen (ADVIL,MOTRIN) tablet 600 mg  600 mg Oral Q8H PRN Illene Labrador, PA-C      . LORazepam (ATIVAN) tablet 1 mg  1 mg Oral Q8H PRN Illene Labrador, PA-C      . nicotine (NICODERM CQ - dosed in mg/24 hours) patch 21 mg  21 mg Transdermal Daily Illene Labrador, PA-C   21 mg at 05/06/13 1014  . ondansetron (ZOFRAN) tablet 4 mg  4 mg Oral Q8H PRN Illene Labrador, PA-C      . thiamine (VITAMIN B-1) tablet 100 mg  100 mg Oral Daily Illene Labrador, PA-C   100 mg at 05/06/13 1014   Or  . thiamine (B-1) injection 100 mg  100 mg Intravenous Daily Robyn M Albert, PA-C      . zolpidem (AMBIEN) tablet 5 mg  5 mg Oral QHS PRN Illene Labrador, PA-C       Current Outpatient Prescriptions  Medication Sig Dispense Refill  . lurasidone (LATUDA) 40 MG TABS tablet Take 40 mg by mouth daily with breakfast.        Psychiatric Specialty Exam:     Blood pressure 114/76, pulse 67, temperature 98.1 F (36.7 C), temperature source Oral, resp. rate 18, SpO2 100.00%.There is no weight on file to calculate BMI.  General Appearance: Casual  Eye Contact::  Fair  Speech:  Slow  Volume:  Normal  Mood:  Euthymic  Affect:  Constricted  Thought Process:  Linear  Orientation:  Full (Time, Place, and Person)  Thought Content:  Rumination  Suicidal Thoughts:  No  Homicidal Thoughts:  No  Memory:  Recent;   Fair  Judgement:  Fair  Insight:  Shallow  Psychomotor Activity:  Decreased  Concentration:  Fair  Recall:  [place]: Fair  Akathisia:  Negative  Handed:  Right  AIMS (if indicated):     Assets:  Desire for Improvement Vocational/Educational  Sleep:      Musculoskeletal: Strength & Muscle Tone: within normal limits Gait & Station: normal Patient leans: N/A  Treatment Plan Summary: Vitals are stable. Patient is calm and not suicidal. Outpatient referrals and  information provided. Social services to provide relavant information and appointments/information.  Mical Brun 05/06/2013 10:53 AM

## 2013-05-06 NOTE — [facility] Suicide Risk Assessment (Signed)
Suicide Risk Assessment  Discharge Assessment     Demographic Factors:  Male and unemployed  Total Time spent with patient: 45 minutes  Psychiatric Specialty Exam:     Blood pressure 114/76, pulse 67, temperature 98.1 F (36.7 C), temperature source Oral, resp. rate 18, SpO2 100.00%.There is no weight on file to calculate BMI.  General Appearance: Casual  Eye Contact::  Fair  Speech:  Slow  Volume:  Normal  Mood:  Euthymic  Affect:  Constricted  Thought Process:  Linear  Orientation:  Full (Time, Place, and Person)  Thought Content:  Rumination  Suicidal Thoughts:  No  Homicidal Thoughts:  No  Memory:  Recent;   Fair  Judgement:  Fair  Insight:  Shallow  Psychomotor Activity:  Decreased  Concentration:  Fair  Recall:  [employer]  Fund of Knowledge:Fair  Language: Fair  Akathisia:  Negative  Handed:  Right  AIMS (if indicated):     Assets:  Desire for Improvement Vocational/Educational  Sleep:       Musculoskeletal: Strength & Muscle Tone: within normal limits Gait & Station: normal Patient leans: N/A   Mental Status Per Nursing Assessment::   On Admission:     Current Mental Status by Physician: see above  Loss Factors: Decrease in vocational status and Financial problems/change in socioeconomic status  Historical Factors: history of detox and rehab  Risk Reduction Factors:   sense of responsibility  Continued Clinical Symptoms:  Alcohol/Substance Abuse/Dependencies Unstable or Poor Therapeutic Relationship Previous Psychiatric Diagnoses and Treatments  Cognitive Features That Contribute To Risk:  Closed-mindedness Polarized thinking    Suicide Risk:  Minimal: No identifiable suicidal ideation.  Patients presenting with no risk factors but with morbid ruminations; may be classified as minimal risk based on the severity of the depressive symptoms  Discharge Diagnoses:   AXIS I:  Substance Induced Mood Disorder and Alcohol use disorder, severe AXIS  II:  Deferred AXIS III:   Past Medical History  Diagnosis Date  . Alcohol abuse   . Schizophrenia   . Bipolar 1 disorder   . Mental disorder   . Depression    AXIS IV:  economic problems, other psychosocial or environmental problems and problems with primary support group AXIS V:  51-60 moderate symptoms  Plan Of Care/Follow-up recommendations:  Activity:  as tolerated Diet:  regular  Is patient on multiple antipsychotic therapies at discharge:  No   Has Patient had three or more failed trials of antipsychotic monotherapy by history:  No  Recommended Plan for Multiple Antipsychotic Therapies: NA. Discharge to shelter and referred to Outpatient substance abuse resources. Compliance with recommendations and referrals.    Gilmore LarocheAKHTAR, Makaylie Dedeaux MD 05/06/2013, 11:01 AM

## 2013-05-06 NOTE — BH Assessment (Signed)
Tele Assessment Note   Jimmy [name] is an 58 y.o. male.  Pt came to [facility] according to [place] because he wanted detox.  Patient is also homeless and has expressed concern about being out in the weather overnight.  Patient does report that he wants to go in for detox services.  He reports that he has been drinking six to eight 40oz beers daily.  Patient last drank on 02/12 prior to arrival.  Patient said that he was at [facility] for 10 days then was incarcerated for 10 days and has only been drinking for the last 4 days.  Patient was at [facility] last in August of 2013.  Pt is concerned also about being out in the cold overnight due to his homelessness.  Patient has no SI, HI or A/V hallucinations at this time.  UDS is clear.  -Clinician spoke with Dr. Luretha Rued and discussed need for detox even when patient had been sober for that number of days and has been drinking for four days.  Dr. Luretha Rued felt that patient may need detox still due to chronicity of alcoholism.  Patient will be seen and evaluated by psychiatry in AM today (02/13).   Axis I: 303.90 ETOH use d/o, severe Axis II: Deferred Axis III:  Past Medical History  Diagnosis Date  . Alcohol abuse   . Schizophrenia   . Bipolar 1 disorder   . Mental disorder   . Depression    Axis IV: housing problems, occupational problems, problems related to legal system/crime, problems related to social environment and problems with primary support group Axis V: 41-50 serious symptoms  Past Medical History:  Past Medical History  Diagnosis Date  . Alcohol abuse   . Schizophrenia   . Bipolar 1 disorder   . Mental disorder   . Depression     Past Surgical History  Procedure Laterality Date  . Skin graft      Family History: No family history on file.  Social History:  reports that he has been smoking Cigarettes.  He has a 60 pack-year smoking history. He does not have any smokeless tobacco history on file. He reports that he drinks about  12.6 ounces of alcohol per week. He reports that he uses illicit drugs (Cocaine and Marijuana).  Additional Social History:  Alcohol / Drug Use Pain Medications: N/A Prescriptions: N/A Over the Counter: N/A History of alcohol / drug use?: Yes Withdrawal Symptoms: Cramps;Diarrhea;Fever / Chills;Irritability;Nausea / Vomiting;Patient aware of relationship between substance abuse and physical/medical complications;Sweats;Tingling;Tremors;Weakness Substance #1 Name of Substance 1: ETOH (beer) 1 - Age of First Use: 58 years of age 34 - Amount (size/oz): Six to eight 40's per day 1 - Frequency: Daily use 1 - Duration: Last 4 days.  Pt had been in rehab for 10 days then incarceratied for 10 days then has been out of jail for only 4 days. 1 - Last Use / Amount: 02/12  CIWA: CIWA-Ar BP: 114/76 mmHg Pulse Rate: 67 COWS:    Allergies: No Known Allergies  Home Medications:  (Not in a hospital admission)  OB/GYN Status:  No LMP for male patient.  General Assessment Data Location of Assessment: [facility] Is this a Tele or Face-to-Face Assessment?: Tele Assessment Is this an Initial Assessment or a Re-assessment for this encounter?: Initial Assessment Living Arrangements: Other (Comment) (Homeless) Can pt return to current living arrangement?: Yes Admission Status: Voluntary Is patient capable of signing voluntary admission?: Yes Transfer from: Acute Hospital Referral Source: Self/Family/Friend  [facility] Crisis Care Plan Living Arrangements: Other (Comment) (Homeless) Name of Psychiatrist: None  Museum/gallery curator) Name of Therapist: None      Risk to self Suicidal Ideation: No-Not Currently/Within Last 6 Months Suicidal Intent: No-Not Currently/Within Last 6 Months Is patient at risk for suicide?: No Suicidal Plan?: No-Not Currently/Within Last 6 Months Specify Current Suicidal Plan: NOne Access to Means: No What has been your use of drugs/alcohol within the last 12 months?: Drinking for last  4 days Previous Attempts/Gestures: Yes How many times?: 1 Other Self Harm Risks: N/A Triggers for Past Attempts: Other (Comment) (Intoxication) Intentional Self Injurious Behavior: None Family Suicide History: Yes (Paternal GGF committed suicide) Recent stressful life event(s): Conflict (Comment);Other (Comment) (Homeless & family conflict) Persecutory voices/beliefs?: No Depression: Yes Depression Symptoms:  (Pt denied any depressive symptoms) Substance abuse history and/or treatment for substance abuse?: Yes Suicide prevention information given to non-admitted patients: Not applicable  Risk to Others Homicidal Ideation: No-Not Currently/Within Last 6 Months Thoughts of Harm to Others: No-Not Currently Present/Within Last 6 Months Current Homicidal Plan: No-Not Currently/Within Last 6 Months Describe Current Homicidal Plan: None Access to Homicidal Means: No Identified Victim: No one History of harm to others?: No Assessment of Violence: None Noted Violent Behavior Description: Denies Does patient have access to weapons?: No Criminal Charges Pending?: Yes Describe Pending Criminal Charges: Assult w/ deadly weapon/ drunk & disorderly Does patient have a court date: Yes Court Date: 06/03/13  Psychosis Hallucinations: None noted;Auditory;Visual Delusions: Persecutory  Mental Status Report Appear/Hygiene: Disheveled;Poor hygiene Eye Contact: Fair Motor Activity: Freedom of movement;Unremarkable Speech: Logical/coherent Level of Consciousness: Alert Mood: Anxious Affect: Anxious;Apprehensive Anxiety Level: Moderate Thought Processes: Coherent;Relevant Judgement: Impaired Orientation: Person;Place;Time;Situation Obsessive Compulsive Thoughts/Behaviors: None  Cognitive Functioning Concentration: Decreased Memory: Recent Intact;Remote Intact IQ: Average Insight: Fair Impulse Control: Poor Appetite: Good Weight Loss: 0 Weight Gain: 0 Sleep: Decreased Total Hours of  Sleep: 6 Vegetative Symptoms: None  [facility]) Patient's cognitive ability adequate to safely complete daily activities?: Yes Patient able to express need for assistance with ADLs?: Yes Independently performs ADLs?: Yes (appropriate for developmental age)  Prior Inpatient Therapy Prior Inpatient Therapy: Yes Prior Therapy Dates: 2013, 2006 Prior Therapy Facilty/Provider(s[place] [facility], [place] Reason for Treatment: Detox, MH   Prior Outpatient Therapy Prior Outpatient Therapy: Yes Prior Therapy Dates: 2007 Prior Therapy Facilty/Provider(s): [facility] Reason for Treatment: bipolar disorder  ADL Screening (condition at time of admission) Patient's cognitive ability adequate to safely complete daily activities?: Yes Is the patient deaf or have difficulty hearing?: No Does the patient have difficulty seeing, even when wearing glasses/contacts?: No Does the patient have difficulty concentrating, remembering, or making decisions?: No Patient able to express need for assistance with ADLs?: Yes Does the patient have difficulty dressing or bathing?: No Independently performs ADLs?: Yes (appropriate for developmental age) Does the patient have difficulty walking or climbing stairs?: No Weakness of Legs: None Weakness of Arms/Hands: None       Abuse/Neglect Assessment (Assessment to be complete while patient is alone) Physical Abuse: Denies Verbal Abuse: Denies Sexual Abuse: Denies Exploitation of patient/patient's resources: Denies Self-Neglect: Denies Values / Beliefs Cultural Requests During Hospitalization: None Spiritual Requests During Hospitalization: None   Advance Directives (For Healthcare) Advance Directive: Patient does not have advance directive;Patient would not like information    Additional Information 1:1 In Past 12 Months?: No CIRT Risk: No Elopement Risk: No Does patient have  medical clearance?: Yes     Disposition:  Disposition Initial  Assessment Completed for this Encounter: Yes Disposition of Patient: Outpatient treatment;Referred to Type of inpatient treatment program: Adult Type of outpatient treatment:  (Rehabilitation service like [facility]) Other disposition(s):  (Referrals given for residential rehab) Patient referred to: [facility];RTS  Alexandria LodgeHarvey, [place] 05/06/2013 6:29 AM

## 2013-05-22 ENCOUNTER — Emergency Department (HOSPITAL_COMMUNITY)
Admission: EM | Admit: 2013-05-22 | Discharge: 2013-05-23 | Disposition: A | Discharge status: Home / Self Care | Payer: Self-pay | Attending: Emergency Medicine | Admitting: Emergency Medicine

## 2013-05-22 ENCOUNTER — Encounter (HOSPITAL_COMMUNITY): Payer: Self-pay | Admitting: Emergency Medicine

## 2013-05-22 DIAGNOSIS — Z79899 Other long term (current) drug therapy: Secondary | ICD-10-CM | POA: Insufficient documentation

## 2013-05-22 DIAGNOSIS — F1410 Cocaine abuse, uncomplicated: Secondary | ICD-10-CM | POA: Insufficient documentation

## 2013-05-22 DIAGNOSIS — F319 Bipolar disorder, unspecified: Secondary | ICD-10-CM | POA: Insufficient documentation

## 2013-05-22 DIAGNOSIS — F1020 Alcohol dependence, uncomplicated: Secondary | ICD-10-CM | POA: Diagnosis present

## 2013-05-22 DIAGNOSIS — F209 Schizophrenia, unspecified: Secondary | ICD-10-CM | POA: Insufficient documentation

## 2013-05-22 DIAGNOSIS — F17200 Nicotine dependence, unspecified, uncomplicated: Secondary | ICD-10-CM | POA: Insufficient documentation

## 2013-05-22 LAB — COMPREHENSIVE METABOLIC PANEL
ALT: 16 U/L (ref 0–53)
AST: 26 U/L (ref 0–37)
Albumin: 4.1 g/dL (ref 3.5–5.2)
Alkaline Phosphatase: 86 U/L (ref 39–117)
BILIRUBIN TOTAL: 0.3 mg/dL (ref 0.3–1.2)
BUN: 10 mg/dL (ref 6–23)
CALCIUM: 9.5 mg/dL (ref 8.4–10.5)
CHLORIDE: 98 meq/L (ref 96–112)
CO2: 23 mEq/L (ref 19–32)
CREATININE: 0.62 mg/dL (ref 0.50–1.35)
GFR calc non Af Amer: >90 mL/min (ref >90)
GLUCOSE: 82 mg/dL (ref 70–99)
Potassium: 4.6 mEq/L (ref 3.7–5.3)
Sodium: 138 mEq/L (ref 137–147)
Total Protein: 8.8 g/dL — ABNORMAL HIGH (ref 6.0–8.3)

## 2013-05-22 LAB — SALICYLATE LEVEL: Salicylate Lvl: <2 mg/dL — ABNORMAL LOW (ref 2.8–20.0)

## 2013-05-22 LAB — CBC
HCT: 41.6 % (ref 39.0–52.0)
HEMOGLOBIN: 14.8 g/dL (ref 13.0–17.0)
MCH: 33.2 pg (ref 26.0–34.0)
MCHC: 35.6 g/dL (ref 30.0–36.0)
MCV: 93.3 fL (ref 78.0–100.0)
Platelets: 398 10*3/uL (ref 150–400)
RBC: 4.46 MIL/uL (ref 4.22–5.81)
RDW: 12.1 % (ref 11.5–15.5)
WBC: 11 10*3/uL — AB (ref 4.0–10.5)

## 2013-05-22 LAB — RAPID URINE DRUG SCREEN, HOSP PERFORMED
Amphetamines: NOT DETECTED
Barbiturates: NOT DETECTED
Benzodiazepines: NOT DETECTED
Cocaine: POSITIVE — AB
OPIATES: NOT DETECTED
Tetrahydrocannabinol: NOT DETECTED

## 2013-05-22 LAB — ETHANOL: Alcohol, Ethyl (B): 93 mg/dL — ABNORMAL HIGH (ref 0–11)

## 2013-05-22 LAB — ACETAMINOPHEN LEVEL: Acetaminophen (Tylenol), Serum: <15 ug/mL (ref 10–30)

## 2013-05-22 MED ORDER — ONDANSETRON HCL 4 MG PO TABS: 4.0000 mg | ORAL_TABLET | Freq: Three times a day (TID) | ORAL | Status: DC | PRN

## 2013-05-22 MED ORDER — LORAZEPAM 1 MG PO TABS: 0.0000 mg | ORAL_TABLET | Freq: Two times a day (BID) | ORAL | Status: DC

## 2013-05-22 MED ORDER — IBUPROFEN 200 MG PO TABS: 600.0000 mg | ORAL_TABLET | Freq: Three times a day (TID) | ORAL | Status: DC | PRN

## 2013-05-22 MED ORDER — NICOTINE 21 MG/24HR TD PT24: 21.0000 mg | MEDICATED_PATCH | Freq: Every day | TRANSDERMAL | Status: DC

## 2013-05-22 MED ORDER — ALUM & MAG HYDROXIDE-SIMETH 200-200-20 MG/5ML PO SUSP: 30.0000 mL | ORAL | Status: DC | PRN

## 2013-05-22 MED ORDER — LORAZEPAM 1 MG PO TABS
0.0000 mg | ORAL_TABLET | Freq: Four times a day (QID) | ORAL | Status: DC
  Administered 2013-05-23: 1 mg via ORAL
  Filled 2013-05-22: qty 1

## 2013-05-22 MED ORDER — ZOLPIDEM TARTRATE 5 MG PO TABS: 5.0000 mg | ORAL_TABLET | Freq: Every evening | ORAL | Status: DC | PRN

## 2013-05-22 NOTE — ED Notes (Signed)
Pt transported from [facility] homeless [facility] requesting detox. Per EMS pt has been intoxicated x 3 days.

## 2013-05-22 NOTE — ED Notes (Signed)
Pt called for blood draw no answer from lobby x1

## 2013-05-23 ENCOUNTER — Encounter (HOSPITAL_COMMUNITY): Payer: Self-pay | Admitting: Psychiatry

## 2013-05-23 DIAGNOSIS — F1010 Alcohol abuse, uncomplicated: Secondary | ICD-10-CM

## 2013-05-23 DIAGNOSIS — F3130 Bipolar disorder, current episode depressed, mild or moderate severity, unspecified: Secondary | ICD-10-CM

## 2013-05-23 NOTE — ED Provider Notes (Signed)
CSN: [id number]     Arrival date & time 05/22/13  1913 History   First MD Initiated Contact with Patient 05/22/13 2226     Chief Complaint  Patient presents with  . Medical Clearance   HPI  History provided by the patient. Patient is a 58 year old male with history of bipolar disorder, depression and alcohol abuse who presents by EMS with request for alcohol detox. Patient reports drinking large amounts of beer every day. He states today he is requesting help with detox to stop drinking. Patient has been to the emergency department for similar issues in the past. He initially denies any other drug use but then admits to some cocaine use recently. He denies any other complaints. Denies any SI or HI. Denies any recent fever chills or sweats.    Past Medical History  Diagnosis Date  . Alcohol abuse   . Schizophrenia   . Bipolar 1 disorder   . Mental disorder   . Depression    Past Surgical History  Procedure Laterality Date  . Skin graft     History reviewed. No pertinent family history. History  Substance Use Topics  . Smoking status: Current Every Day Smoker -- 2.00 packs/day for 30 years    Types: Cigarettes  . Smokeless tobacco: Not on file  . Alcohol Use: 12.6 oz/week    21 Cans of beer per week     Comment: drinks as much etoh as he can get per day    Review of Systems  All other systems reviewed and are negative.      Allergies  Review of patient's allergies indicates no known allergies.  Home Medications   Current Outpatient Rx  Name  Route  Sig  Dispense  Refill  . acetaminophen (TYLENOL) 325 MG tablet   Oral   Take 650 mg by mouth every 6 (six) hours as needed for mild pain, fever or headache.         Marland Kitchen. FLUoxetine (PROZAC) 40 MG capsule   Oral   Take 40 mg by mouth daily.         Marland Kitchen. lurasidone (LATUDA) 40 MG TABS tablet   Oral   Take 40 mg by mouth daily with breakfast.          BP 122/69  Pulse 99  Temp(Src) 98.5 F (36.9 C) (Oral)  Resp  20  SpO2 97% Physical Exam  Nursing note and vitals reviewed. Constitutional: He is oriented to person, place, and time. He appears well-developed and well-nourished. No distress.  HENT:  Head: Normocephalic.  Cardiovascular: Normal rate and regular rhythm.   Pulmonary/Chest: Effort normal and breath sounds normal. No respiratory distress. He has no wheezes. He has no rales.  Abdominal: Soft. There is no tenderness.  Neurological: He is alert and oriented to person, place, and time.  Skin: Skin is warm.  Psychiatric: He has a normal mood and affect. His behavior is normal. He expresses no homicidal and no suicidal ideation.    ED Course  Procedures   DIAGNOSTIC STUDIES: Oxygen Saturation is 97% on room air.    COORDINATION OF CARE:  Nursing notes reviewed. Vital signs reviewed. Initial pt interview and examination performed.   12:05 AM-patient seen and evaluated. Discussed work up plan with pt at bedside, which includes medical clearance and TTS Consult. Pt agrees with plan.  Psychiatric holding orders in place.  TTS consult placed.   TTS will look for placement. The patient is medically cleared and may have further  psychiatric and addiction treatment   MDM   Final diagnoses:  Alcohol dependence        Angus Seller, [place] 05/23/13 1610

## 2013-05-23 NOTE — BHH Suicide Risk Assessment (Signed)
Suicide Risk Assessment  Discharge Assessment     Demographic Factors:  Male, Divorced or widowed, Caucasian and Low socioeconomic status  Total Time spent with patient: 20 minutes  Psychiatric Specialty Exam:     Blood pressure 117/72, pulse 80, temperature 98 F (36.7 C), temperature source Oral, resp. rate 20, SpO2 98.00%.There is no weight on file to calculate BMI.  General Appearance: Disheveled  Eye SolicitorContact::  Fair  Speech:  Normal Rate  Volume:  Normal  Mood:  Anxious and Irritable  Affect:  Congruent  Thought Process:  Coherent  Orientation:  Full (Time, Place, and Person)  Thought Content:  WDL  Suicidal Thoughts:  No  Homicidal Thoughts:  No  Memory:  Immediate;   Fair Recent;   Fair Remote;   Fair  Judgement:  Good  Insight:  Fair  Psychomotor Activity:  Normal  Concentration:  Fair  Recall:  FiservFair  Fund of Knowledge:Fair  Language: Fair  Akathisia:  No  Handed:  Right  AIMS (if indicated):     Assets:  Leisure Time Resilience  Sleep:       Musculoskeletal: Strength & Muscle Tone: within normal limits Gait & Station: normal Patient leans: N/A   Mental Status Per Nursing Assessment::   On Admission:   Patient presented intoxicated and wanting detox until the effects of the alcohol decreased.  Then, he wanted to leave.  He denies suicidal/homicidal ideations and hallucinations.  Current Mental Status by Physician: NA  Loss Factors: Financial problems/change in socioeconomic status  Historical Factors: NA  Risk Reduction Factors:   Positive therapeutic relationship  Continued Clinical Symptoms:  Alcohol/Substance Abuse/Dependencies  Cognitive Features That Contribute To Risk:  N/A  Suicide Risk:  Minimal: No identifiable suicidal ideation.  Patients presenting with no risk factors but with morbid ruminations; may be classified as minimal risk based on the severity of the depressive symptoms  Discharge Diagnoses:   AXIS I:  Alcohol  Abuse and Bipolar, Depressed AXIS II:  Deferred AXIS III:   Past Medical History  Diagnosis Date  . Alcohol abuse   . Schizophrenia   . Bipolar 1 disorder   . Mental disorder   . Depression    AXIS IV:  economic problems, housing problems and problems related to social environment AXIS V:  61-70 mild symptoms  Plan Of Care/Follow-up recommendations:  Activity:  as tolerated Diet:  low-sodium heart healthy diet  Is patient on multiple antipsychotic therapies at discharge:  No   Has Patient had three or more failed trials of antipsychotic monotherapy by history:  No  Recommended Plan for Multiple Antipsychotic Therapies: NA    Ilyssa Grennan, PMH-NP 05/23/2013, 11:51 AM

## 2013-05-23 NOTE — BH Assessment (Signed)
Clinician consulted with Alberteen SamFran Hobson, NP about pt who recommends inpatient detox.  [facility]ard[facility] CelesteJoanna[facility]  TTS will look for alternative placement.  Clinician followed up with Theron AristaPeter, [place] with recommendations.  Clinician contacted Marcelino DusterMichelle, MHT for disposition.  Yaakov Guthrieelilah Stewart, MSW, LCSW Triage Specialist [phone]

## 2013-05-23 NOTE — Discharge Instructions (Signed)
Alcohol Problems °Most adults who drink alcohol drink in moderation (not a lot) are at low risk for developing problems related to their drinking. However, all drinkers, including low-risk drinkers, should know about the health risks connected with drinking alcohol. °RECOMMENDATIONS FOR LOW-RISK DRINKING  °Drink in moderation. Moderate drinking is defined as follows:  °· Men - no more than 2 drinks per day. °· Nonpregnant women - no more than 1 drink per day. °· Over age 65 - no more than 1 drink per day. °A standard drink is 12 grams of pure alcohol, which is equal to a 12 ounce bottle of beer or wine cooler, a 5 ounce glass of wine, or 1.5 ounces of distilled spirits (such as whiskey, brandy, vodka, or rum).  °ABSTAIN FROM (DO NOT DRINK) ALCOHOL: °· When pregnant or considering pregnancy. °· When taking a medication that interacts with alcohol. °· If you are alcohol dependent. °· A medical condition that prohibits drinking alcohol (such as ulcer, liver disease, or heart disease). °DISCUSS WITH YOUR CAREGIVER: °· If you are at risk for coronary heart disease, discuss the potential benefits and risks of alcohol use: Light to moderate drinking is associated with lower rates of coronary heart disease in certain populations (for example, men over age 45 and postmenopausal women). Infrequent or nondrinkers are advised not to begin light to moderate drinking to reduce the risk of coronary heart disease so as to avoid creating an alcohol-related problem. Similar protective effects can likely be gained through proper diet and exercise. °· Women and the elderly have smaller amounts of body water than men. As a result women and the elderly achieve a higher blood alcohol concentration after drinking the same amount of alcohol. °· Exposing a fetus to alcohol can cause a broad range of birth defects referred to as Fetal Alcohol Syndrome (FAS) or Alcohol-Related Birth Defects (ARBD). Although FAS/ARBD is connected with excessive  alcohol consumption during pregnancy, studies also have reported neurobehavioral problems in infants born to mothers reporting drinking an average of 1 drink per day during pregnancy. °· Heavier drinking (the consumption of more than 4 drinks per occasion by men and more than 3 drinks per occasion by women) impairs learning (cognitive) and psychomotor functions and increases the risk of alcohol-related problems, including accidents and injuries. °CAGE QUESTIONS:  °· Have you ever felt that you should Cut down on your drinking? °· Have people Annoyed you by criticizing your drinking? °· Have you ever felt bad or Guilty about your drinking? °· Have you ever had a drink first thing in the morning to steady your nerves or get rid of a hangover (Eye opener)? °If you answered positively to any of these questions: You may be at risk for alcohol-related problems if alcohol consumption is:  °· Men: Greater than 14 drinks per week or more than 4 drinks per occasion. °· Women: Greater than 7 drinks per week or more than 3 drinks per occasion. °Do you or your family have a medical history of alcohol-related problems, such as: °· Blackouts. °· Sexual dysfunction. °· Depression. °· Trauma. °· Liver dysfunction. °· Sleep disorders. °· Hypertension. °· Chronic abdominal pain. °· Has your drinking ever caused you problems, such as problems with your family, problems with your work (or school) performance, or accidents/injuries? °· Do you have a compulsion to drink or a preoccupation with drinking? °· Do you have poor control or are you unable to stop drinking once you have started? °· Do you have to drink to   avoid withdrawal symptoms? °· Do you have problems with withdrawal such as tremors, nausea, sweats, or mood disturbances? °· Does it take more alcohol than in the past to get you high? °· Do you feel a strong urge to drink? °· Do you change your plans so that you can have a drink? °· Do you ever drink in the morning to relieve  the shakes or a hangover? °If you have answered a number of the previous questions positively, it may be time for you to talk to your caregivers, family, and friends and see if they think you have a problem. Alcoholism is a chemical dependency that keeps getting worse and will eventually destroy your health and relationships. Many alcoholics end up dead, impoverished, or in prison. This is often the end result of all chemical dependency. °· Do not be discouraged if you are not ready to take action immediately. °· Decisions to change behavior often involve up and down desires to change and feeling like you cannot decide. °· Try to think more seriously about your drinking behavior. °· Think of the reasons to quit. °WHERE TO GO FOR ADDITIONAL INFORMATION  °· The National Institute on Alcohol Abuse and Alcoholism (NIAAA) °www.niaaa.nih.gov °· National Council on Alcoholism and Drug Dependence (NCADD) °www.ncadd.org °· American Society of Addiction Medicine (ASAM) °www.asam.org  °Document Released: 03/10/2005 Document Revised: 06/02/2011 Document Reviewed: 10/27/2007 °ExitCare® Patient Information ©2014 [employer]. ° °

## 2013-05-23 NOTE — BH Assessment (Signed)
Tele Assessment Note   Jimmy [name] is an 59 y.o. male who presents to [facility] by EMS requesting detox from alcohol and cocaine. Pt reported drinking 4- 40 oz beers daily and using $20 worth of cocaine daily. Pt reported last use of both was night of 05/22/13.  Pt reported experiencing withdrawal symptoms. Pt reported feeling depressed but reported experiencing few symptoms. He stated he has no current issues with sleep and appetite. Pt was at ED on 05/06/13 for detox as well and was admitted into [facility]. Pt reported difficulty with sobriety.  Pt reported he has been drinking for several years.  Pt reported "It's just hard."  Pt reported "I'm homeless and everyone out there drinks."  Patient reported no income and no natural supports.   Pt denies SI, HI and A/V/H. When clinician inquired about past SI, pt reported "its been a while since that happened."  Pt reported currently receiving medication management and therapy from [facility].  Pt reported being prescribed Prozac and Latuda.  Pt reported having "a lot" of inpatient detox admissions. Pt has been to [facility] 3x with last admission 10/2011.     Axis I: Alcohol Use Disorder, Severe Axis II: Deferred Axis III:  Past Medical History  Diagnosis Date  . Alcohol abuse   . Schizophrenia   . Bipolar 1 disorder   . Mental disorder   . Depression    Axis IV: economic problems, housing problems, occupational problems, other psychosocial or environmental problems and problems related to legal system/crime   Past Medical History:  Past Medical History  Diagnosis Date  . Alcohol abuse   . Schizophrenia   . Bipolar 1 disorder   . Mental disorder   . Depression     Past Surgical History  Procedure Laterality Date  . Skin graft      Family History: History reviewed. No pertinent family history.  Social History:  reports that he has been smoking Cigarettes.  He has a 60 pack-year smoking history. He does not have any smokeless tobacco history on  file. He reports that he drinks about 12.6 ounces of alcohol per week. He reports that he uses illicit drugs (Cocaine and Marijuana).  Additional Social History:  Alcohol / Drug Use Pain Medications: none reported Prescriptions: Latuda, Prozac Over the Counter: none reported History of alcohol / drug use?: Yes Longest period of sobriety (when/how long): unk Negative Consequences of Use: Legal;Financial Withdrawal Symptoms: Cramps;Sweats;Nausea / Vomiting;Tremors;Weakness Substance #1 Name of Substance 1: ETOH  1 - Age of First Use: 15 1 - Amount (size/oz): 4 -40 oz beers 1 - Frequency: daily 1 - Duration: several years 1 - Last Use / Amount: 7pm 05/22/13 Substance #2 Name of Substance 2: Cocaine 2 - Age of First Use: 31 2 - Amount (size/oz): $20 worth 2 - Frequency: daily 2 - Duration: ongoing for the last year 2 - Last Use / Amount: 05/22/13  CIWA: CIWA-Ar BP: 122/69 mmHg Pulse Rate: 99 COWS:    Allergies: No Known Allergies  Home Medications:  (Not in a hospital admission)  OB/GYN Status:  No LMP for male patient.  General Assessment Data Location of Assessment: [facility] ED Is this a Tele or Face-to-Face Assessment?: Face-to-Face Is this an Initial Assessment or a Re-assessment for this encounter?: Initial Assessment Living Arrangements: Other (Comment) (Living in a homeless shelter) Can pt return to current living arrangement?: Yes Admission Status: Voluntary Is patient capable of signing voluntary admission?: Yes Transfer from: Acute Hospital Referral Source:  Self/Family/Friend  Medical Screening Exam [facility] Walk-in ONLY) Medical Exam completed:  (NA)  [facility] Crisis Care Plan Living Arrangements: Other (Comment) (Living in a homeless shelter) Name of Psychiatrist:  Vesta Mixer) Name of Therapist:  Vesta Mixer)     Risk to self Suicidal Ideation: No-Not Currently/Within Last 6 Months Suicidal Intent: No-Not Currently/Within Last 6 Months Is patient at risk for suicide?:  No Suicidal Plan?: No Specify Current Suicidal Plan:  (NA) Access to Means: No What has been your use of drugs/alcohol within the last 12 months?:  (etoh and cocaine daily) Previous Attempts/Gestures: No How many times?:  (none noted) Other Self Harm Risks:  (none reported) Triggers for Past Attempts: Unknown Intentional Self Injurious Behavior: None Family Suicide History: Yes (GF committed suicide) Recent stressful life event(s): Other (Comment) ("everyone around me drinks") Persecutory voices/beliefs?: No Depression: Yes Depression Symptoms: Isolating;Feeling angry/irritable;Feeling worthless/self pity (Hopelessness) Substance abuse history and/or treatment for substance abuse?: Yes Suicide prevention information given to non-admitted patients: Not applicable  Risk to Others Homicidal Ideation: No Thoughts of Harm to Others: No Current Homicidal Intent: No Current Homicidal Plan: No Describe Current Homicidal Plan:  (none noted) Access to Homicidal Means: No Identified Victim:  (NA) History of harm to others?: Yes Assessment of Violence: In past 6-12 months Violent Behavior Description:  (Pt reported current assault charge. ) Does patient have access to weapons?: No Criminal Charges Pending?: Yes Describe Pending Criminal Charges:  (Pending assault charge) Does patient have a court date: Yes Court Date:  (06/06/13)  Psychosis Hallucinations: None noted Delusions: None noted  Mental Status Report Appear/Hygiene: Body odor;Other (Comment) (Pt was wearing paper scrubs.) Eye Contact: Fair Motor Activity: Agitation Speech: Slurred;Other (Comment) (Logical) Level of Consciousness: Restless Mood: Depressed Affect: Other (Comment);Apprehensive (Agitated) Anxiety Level: None Thought Processes: Coherent;Relevant Judgement: Impaired Orientation: Person;Place;Time;Situation Obsessive Compulsive Thoughts/Behaviors: None  Cognitive Functioning Memory: Recent Intact;Remote  Intact IQ: Average Insight: Poor Impulse Control: Poor Appetite: Fair Weight Loss:  (unk) Weight Gain:  (unk) Sleep: No Change Total Hours of Sleep:  (unk) Vegetative Symptoms: None  ADLScreening [facility] Assessment Services) Patient's cognitive ability adequate to safely complete daily activities?: Yes Patient able to express need for assistance with ADLs?: Yes Independently performs ADLs?: Yes (appropriate for developmental age)  Prior Inpatient Therapy Prior Inpatient Therapy: Yes Prior Therapy Dates:  (several; most recent 04/2013; 3x a [facility]) Prior Therapy Facilty/Provider(s):  (most recent [facility], [facility] 3x) Reason for Treatment:  (Detox, and SI)  Prior Outpatient Therapy Prior Outpatient Therapy: Yes Prior Therapy Dates:  (currently) Prior Therapy Facilty/Provider(s):  [facility]) Reason for Treatment:  (mood disorder)  ADL Screening (condition at time of admission) Patient's cognitive ability adequate to safely complete daily activities?: Yes Is the patient deaf or have difficulty hearing?: No Does the patient have difficulty seeing, even when wearing glasses/contacts?: No Does the patient have difficulty concentrating, remembering, or making decisions?: No Patient able to express need for assistance with ADLs?: Yes Does the patient have difficulty dressing or bathing?: No Independently performs ADLs?: Yes (appropriate for developmental age)  Home Assistive Devices/Equipment Home Assistive Devices/Equipment: None    Abuse/Neglect Assessment (Assessment to be complete while patient is alone) Physical Abuse: Denies Verbal Abuse: Denies Sexual Abuse: Denies Exploitation of patient/patient's resources: Denies Self-Neglect: Denies Values / Beliefs Cultural Requests During Hospitalization: None Spiritual Requests During Hospitalization: None   Advance Directives (For Healthcare) Advance Directive: Patient does not have advance directive    Additional Information 1:1 In  Past 12 Months?: No CIRT Risk: No Elopement Risk:  No Does patient have medical clearance?: Yes    Disposition: Per Alberteen SamFran Hobson, NP patient meets criteria for inpatient detox. [facility] Chyrl CivatteJoann[facility]  TTS will look for alternative placement options. Disposition Initial Assessment Completed for this Encounter: Yes Disposition of Patient: Inpatient treatment program Type of inpatient treatment program: Adult Type of outpatient treatment: Adult Other disposition(s): Other (Comment) ([facility] at capacity. TTS will seek alternative placment options.) Patient referred to: [facility]  Stewart,Camera Krienke R 05/23/2013 1:07 AM

## 2013-05-23 NOTE — Consult Note (Signed)
[place] Psychiatry Consult   Reason for Consult:  Alcohol detox Referring Physician:  ED MD ROGER KETTLES is an 58 y.o. male. Total Time spent with patient: 20 minutes  Assessment: AXIS I:  Alcohol Abuse and Bipolar, Depressed AXIS II:  Deferred AXIS III:   Past Medical History  Diagnosis Date  . Alcohol abuse   . Schizophrenia   . Bipolar 1 disorder   . Mental disorder   . Depression    AXIS IV:  other psychosocial or environmental problems, problems related to social environment and problems with primary support group AXIS V:  61-70 mild symptoms  Review of Systems  Constitutional: Negative.   HENT: Negative.   Eyes: Negative.   Respiratory: Negative.   Cardiovascular: Negative.   Gastrointestinal: Negative.   Genitourinary: Negative.   Musculoskeletal: Negative.   Skin: Negative.   Neurological: Negative.   Endo/Heme/Allergies: Negative.   Psychiatric/Behavioral: Positive for substance abuse. The patient is nervous/anxious.     Plan:  No evidence of imminent risk to self or others at present.  Dr. De Nurse assessed the patient also and concurs with the plan to discharge the patient and follow-up with his regular provider.  Subjective:   Jimmy [name] is a 58 y.o. male patient does not warrant admission.  HPI:  Patient came to the ED intoxicated and requested alcohol detox until he sobered up.  Then, he wanted to leave.  He denies suicidal/homicidal ideations and hallucinations and cravings.  Mr. Silliman does not want alcohol detox.  He is a frequent client of the ED with similar admissions and discharges. HPI Elements:   Location:  generalized. Quality:  chronic. Severity:  mild. Timing:  constnat. Duration:  few weeks . Context:  stressors.  Past Psychiatric History: Past Medical History  Diagnosis Date  . Alcohol abuse   . Schizophrenia   . Bipolar 1 disorder   . Mental disorder   . Depression     reports that he has been smoking Cigarettes.   He has a 60 pack-year smoking history. He does not have any smokeless tobacco history on file. He reports that he drinks about 12.6 ounces of alcohol per week. He reports that he uses illicit drugs (Cocaine and Marijuana). History reviewed. No pertinent family history. Family History Substance Abuse: Yes, Describe: Family Supports: No Living Arrangements: Other (Comment) (Living in a homeless shelter) Can pt return to current living arrangement?: Yes Abuse/Neglect [facility]) Physical Abuse: Denies Verbal Abuse: Denies Sexual Abuse: Denies Allergies:  No Known Allergies  ACT Assessment Complete:  Yes:    Educational Status    Risk to Self: Risk to self Suicidal Ideation: No-Not Currently/Within Last 6 Months Suicidal Intent: No-Not Currently/Within Last 6 Months Is patient at risk for suicide?: No Suicidal Plan?: No Specify Current Suicidal Plan:  (NA) Access to Means: No What has been your use of drugs/alcohol within the last 12 months?:  (etoh and cocaine daily) Previous Attempts/Gestures: No How many times?:  (none noted) Other Self Harm Risks:  (none reported) Triggers for Past Attempts: Unknown Intentional Self Injurious Behavior: None Family Suicide History: Yes (GF committed suicide) Recent stressful life event(s): Other (Comment) ("everyone around me drinks") Persecutory voices/beliefs?: No Depression: Yes Depression Symptoms: Isolating;Feeling angry/irritable;Feeling worthless/self pity (Hopelessness) Substance abuse history and/or treatment for substance abuse?: Yes Suicide prevention information given to non-admitted patients: Not applicable  Risk to Others: Risk to Others Homicidal Ideation: No Thoughts of Harm to Others: No Current Homicidal Intent: No Current Homicidal Plan: No  Describe Current Homicidal Plan:  (none noted) Access to Homicidal Means: No Identified Victim:  (NA) History of harm to others?: Yes Assessment of Violence: In past 6-12 months Violent  Behavior Description:  (Pt reported current assault charge. ) Does patient have access to weapons?: No Criminal Charges Pending?: Yes Describe Pending Criminal Charges:  (Pending assault charge) Does patient have a court date: Yes Court Date:  (06/06/13)  Abuse: Abuse/Neglect Assessment (Assessment to be complete while patient is alone) Physical Abuse: Denies Verbal Abuse: Denies Sexual Abuse: Denies Exploitation of patient/patient's resources: Denies Self-Neglect: Denies  Prior Inpatient Therapy: Prior Inpatient Therapy Prior Inpatient Therapy: Yes Prior Therapy Dates:  (several; most recent 04/2013; 3x a [facility]) Prior Therapy Facilty/Provider(s):  (most recent [facility], [place] 3x) Reason for Treatment:  (Detox, and SI)  Prior Outpatient Therapy: Prior Outpatient Therapy Prior Outpatient Therapy: Yes Prior Therapy Dates:  (currently) Prior Therapy Facilty/Provider(s):  ([facility]) Reason for Treatment:  (mood disorder)  Additional Information: Additional Information 1:1 In Past 12 Months?: No CIRT Risk: No Elopement Risk: No Does patient have medical clearance?: Yes                  Objective: Blood pressure 117/72, pulse 80, temperature 98 F (36.7 C), temperature source Oral, resp. rate 20, SpO2 98.00%.There is no weight on file to calculate BMI. Results for orders placed during the hospital encounter of 05/22/13 (from the past 72 hour(s))  ACETAMINOPHEN LEVEL     Status: None   Collection Time    05/22/13  7:31 PM      Result Value Ref Range   Acetaminophen (Tylenol), Serum <15.0  10 - 30 ug/mL   Comment:            THERAPEUTIC CONCENTRATIONS VARY     SIGNIFICANTLY. A RANGE OF 10-30     ug/mL MAY BE AN EFFECTIVE     CONCENTRATION FOR MANY PATIENTS.     HOWEVER, SOME ARE BEST TREATED     AT CONCENTRATIONS OUTSIDE THIS     RANGE.     ACETAMINOPHEN CONCENTRATIONS     >150 ug/mL AT 4 HOURS AFTER     INGESTION AND >50 ug/mL AT 12     HOURS AFTER INGESTION ARE      OFTEN ASSOCIATED WITH TOXIC     REACTIONS.  CBC     Status: Abnormal   Collection Time    05/22/13  7:31 PM      Result Value Ref Range   WBC 11.0 (*) 4.0 - 10.5 K/uL   RBC 4.46  4.22 - 5.81 MIL/uL   Hemoglobin 14.8  13.0 - 17.0 g/dL   HCT 41.6  39.0 - 52.0 %   MCV 93.3  78.0 - 100.0 fL   MCH 33.2  26.0 - 34.0 pg   MCHC 35.6  30.0 - 36.0 g/dL   RDW 12.1  11.5 - 15.5 %   Platelets 398  150 - 400 K/uL  COMPREHENSIVE METABOLIC PANEL     Status: Abnormal   Collection Time    05/22/13  7:31 PM      Result Value Ref Range   Sodium 138  137 - 147 mEq/L   Potassium 4.6  3.7 - 5.3 mEq/L   Chloride 98  96 - 112 mEq/L   CO2 23  19 - 32 mEq/L   Glucose, Bld 82  70 - 99 mg/dL   BUN 10  6 - 23 mg/dL   Creatinine, Ser 0.62  0.50 -  1.35 mg/dL   Calcium 9.5  8.4 - 10.5 mg/dL   Total Protein 8.8 (*) 6.0 - 8.3 g/dL   Albumin 4.1  3.5 - 5.2 g/dL   AST 26  0 - 37 U/L   Comment: SLIGHT HEMOLYSIS   ALT 16  0 - 53 U/L   Alkaline Phosphatase 86  39 - 117 U/L   Total Bilirubin 0.3  0.3 - 1.2 mg/dL   GFR calc non Af Amer >90  >90 mL/min   GFR calc Af Amer >90  >90 mL/min   Comment: (NOTE)     The eGFR has been calculated using the CKD EPI equation.     This calculation has not been validated in all clinical situations.     eGFR's persistently <90 mL/min signify possible Chronic Kidney     Disease.  ETHANOL     Status: Abnormal   Collection Time    05/22/13  7:31 PM      Result Value Ref Range   Alcohol, Ethyl (B) 93 (*) 0 - 11 mg/dL   Comment:            LOWEST DETECTABLE LIMIT FOR     SERUM ALCOHOL IS 11 mg/dL     FOR MEDICAL PURPOSES ONLY  SALICYLATE LEVEL     Status: Abnormal   Collection Time    05/22/13  7:31 PM      Result Value Ref Range   Salicylate Lvl <2.5 (*) 2.8 - 20.0 mg/dL  URINE RAPID DRUG SCREEN (HOSP PERFORMED)     Status: Abnormal   Collection Time    05/22/13  9:45 PM      Result Value Ref Range   Opiates NONE DETECTED  NONE DETECTED   Cocaine POSITIVE (*) NONE  DETECTED   Benzodiazepines NONE DETECTED  NONE DETECTED   Amphetamines NONE DETECTED  NONE DETECTED   Tetrahydrocannabinol NONE DETECTED  NONE DETECTED   Barbiturates NONE DETECTED  NONE DETECTED   Comment:            DRUG SCREEN FOR MEDICAL PURPOSES     ONLY.  IF CONFIRMATION IS NEEDED     FOR ANY PURPOSE, NOTIFY LAB     WITHIN 5 DAYS.                LOWEST DETECTABLE LIMITS     FOR URINE DRUG SCREEN     Drug Class       Cutoff (ng/mL)     Amphetamine      1000     Barbiturate      200     Benzodiazepine   427     Tricyclics       062     Opiates          300     Cocaine          300     THC              50   Labs are reviewed and are pertinent for no medical issues.  Current Facility-Administered Medications  Medication Dose Route Frequency Provider Last Rate Last Dose  . alum & mag hydroxide-simeth (MAALOX/MYLANTA) 200-200-20 MG/5ML suspension 30 mL  30 mL Oral PRN Ruthell Rummage Dammen, PA-C      . ibuprofen (ADVIL,MOTRIN) tablet 600 mg  600 mg Oral Q8H PRN Martie Lee, PA-C      . LORazepam (ATIVAN) tablet 0-4 mg  0-4 mg Oral 4 times per day  Ruthell Rummage Dammen, PA-C   1 mg at 05/23/13 1147   Followed by  . [START ON 05/25/2013] LORazepam (ATIVAN) tablet 0-4 mg  0-4 mg Oral Q12H Peter S Dammen, PA-C      . nicotine (NICODERM CQ - dosed in mg/24 hours) patch 21 mg  21 mg Transdermal Daily Peter S Dammen, PA-C      . ondansetron (ZOFRAN) tablet 4 mg  4 mg Oral Q8H PRN Ruthell Rummage Dammen, PA-C      . zolpidem (AMBIEN) tablet 5 mg  5 mg Oral QHS PRN Martie Lee, PA-C       Current Outpatient Prescriptions  Medication Sig Dispense Refill  . acetaminophen (TYLENOL) 325 MG tablet Take 650 mg by mouth every 6 (six) hours as needed for mild pain, fever or headache.      Marland Kitchen FLUoxetine (PROZAC) 40 MG capsule Take 1 capsule (40 mg total) by mouth daily.      Marland Kitchen lurasidone (LATUDA) 40 MG TABS tablet Take 1 tablet (40 mg total) by mouth daily with breakfast.  30 tablet      Psychiatric Specialty  Exam:     Blood pressure 117/72, pulse 80, temperature 98 F (36.7 C), temperature source Oral, resp. rate 20, SpO2 98.00%.There is no weight on file to calculate BMI.  General Appearance: Disheveled  Eye Sport and exercise psychologist::  Fair  Speech:  Normal Rate  Volume:  Normal  Mood:  Anxious and Irritable  Affect:  Congruent  Thought Process:  Coherent  Orientation:  Full (Time, Place, and Person)  Thought Content:  WDL  Suicidal Thoughts:  No  Homicidal Thoughts:  No  Memory:  Immediate;   Fair Recent;   Fair Remote;   Fair  Judgement:  Good  Insight:  Fair  Psychomotor Activity:  Normal  Concentration:  Fair  Recall:  [employer] of Knowledge:Fair  Language: Fair  Akathisia:  No  Handed:  Right  AIMS (if indicated):     Assets:  Leisure Time Resilience  Sleep:      Musculoskeletal: Strength & Muscle Tone: within normal limits Gait & Station: normal Patient leans: N/A  Treatment Plan Summary: Patient to discharge home and follow-up with his regular provider.  Waylan Boga, [place] 05/23/2013 12:01 PM I have personally seen the patient and agreed with the findings and involved in the treatment plan. Not suicidal or threatening and wants to be discharged. Outpatient referral sources were provided. Merian Capron, MD

## 2013-05-31 NOTE — ED Provider Notes (Signed)
Medical screening examination/treatment/procedure(s) were performed by non-physician practitioner and as supervising physician I was immediately available for consultation/collaboration.   EKG Interpretation None        Anselma Herbel, MD 05/31/13 1636 

## 2013-07-15 ENCOUNTER — Emergency Department (HOSPITAL_COMMUNITY)
Admission: EM | Admit: 2013-07-15 | Discharge: 2013-07-15 | Disposition: A | Discharge status: Home / Self Care | Payer: Self-pay | Attending: Emergency Medicine | Admitting: Emergency Medicine

## 2013-07-15 ENCOUNTER — Encounter (HOSPITAL_COMMUNITY): Payer: Self-pay | Admitting: Emergency Medicine

## 2013-07-15 DIAGNOSIS — F911 Conduct disorder, childhood-onset type: Secondary | ICD-10-CM | POA: Insufficient documentation

## 2013-07-15 DIAGNOSIS — F1010 Alcohol abuse, uncomplicated: Secondary | ICD-10-CM | POA: Insufficient documentation

## 2013-07-15 DIAGNOSIS — F10929 Alcohol use, unspecified with intoxication, unspecified: Secondary | ICD-10-CM

## 2013-07-15 DIAGNOSIS — Z79899 Other long term (current) drug therapy: Secondary | ICD-10-CM | POA: Insufficient documentation

## 2013-07-15 DIAGNOSIS — F209 Schizophrenia, unspecified: Secondary | ICD-10-CM | POA: Insufficient documentation

## 2013-07-15 DIAGNOSIS — IMO0002 Reserved for concepts with insufficient information to code with codable children: Secondary | ICD-10-CM | POA: Insufficient documentation

## 2013-07-15 DIAGNOSIS — F17200 Nicotine dependence, unspecified, uncomplicated: Secondary | ICD-10-CM | POA: Insufficient documentation

## 2013-07-15 DIAGNOSIS — F319 Bipolar disorder, unspecified: Secondary | ICD-10-CM | POA: Insufficient documentation

## 2013-07-15 MED ORDER — NICOTINE 21 MG/24HR TD PT24: 21.0000 mg | MEDICATED_PATCH | Freq: Every day | TRANSDERMAL | Status: DC

## 2013-07-15 MED ORDER — ONDANSETRON HCL 4 MG PO TABS: 4.0000 mg | ORAL_TABLET | Freq: Three times a day (TID) | ORAL | Status: DC | PRN

## 2013-07-15 MED ORDER — ZOLPIDEM TARTRATE 5 MG PO TABS: 5.0000 mg | ORAL_TABLET | Freq: Every evening | ORAL | Status: DC | PRN

## 2013-07-15 MED ORDER — LORAZEPAM 1 MG PO TABS: 1.0000 mg | ORAL_TABLET | Freq: Three times a day (TID) | ORAL | Status: DC | PRN

## 2013-07-15 MED ORDER — IBUPROFEN 200 MG PO TABS: 600.0000 mg | ORAL_TABLET | Freq: Three times a day (TID) | ORAL | Status: DC | PRN

## 2013-07-15 MED ORDER — ALUM & MAG HYDROXIDE-SIMETH 200-200-20 MG/5ML PO SUSP: 30.0000 mL | ORAL | Status: DC | PRN

## 2013-07-15 NOTE — BH Assessment (Signed)
Clinician contacted Tresa EndoKelly, [place] working [place]. Tresa EndoKelly reported pt was discharged to police after being inappropriate with staff by yelling and cursing. Jimmy [name], MSW, LCSW Triage Specialist [phone]

## 2013-07-15 NOTE — ED Notes (Signed)
Per [employer], pt's brother called stating that the pt would like detox. Pt appears intoxicated, with slurred speech and aggressive behavior. Pt is oriented to person, place, time and situation.

## 2013-07-15 NOTE — ED Notes (Signed)
Pt continued to yell at staff and [employer]. Per PA ok to d/c to jail.

## 2013-07-15 NOTE — ED Provider Notes (Signed)
CSN: [id number]     Arrival date & time 07/15/13  2235 History  This chart was scribed for non-physician practitioner, Antony MaduraKelly Mateja Dier, PA-C working with Enid SkeensJoshua M Zavitz, MD by Luisa DagoPriscilla Tutu, ED scribe. This patient was seen in room WTR6/WTR6 and the patient's care was started at 11:06 PM.     Chief Complaint  Patient presents with  . Alcohol Intoxication    The history is provided by the patient. No language interpreter was used.   HPI Comments: Irwin BrakemanJack L Kimes is a 58 y.o. male who presents to the Emergency Department requesting a detox from alcohol. Pt states that he drinks "all he can get" everyday, mostly beer. He denies any illicit drug usage. Denies any SI or HI.   Pt is currently homeless. [place].   Past Medical History  Diagnosis Date  . Alcohol abuse   . Schizophrenia   . Bipolar 1 disorder   . Mental disorder   . Depression    Past Surgical History  Procedure Laterality Date  . Skin graft     History reviewed. No pertinent family history. History  Substance Use Topics  . Smoking status: Current Every Day Smoker -- 2.00 packs/day for 30 years    Types: Cigarettes  . Smokeless tobacco: Not on file  . Alcohol Use: 12.6 oz/week    21 Cans of beer per week     Comment: drinks as much etoh as he can get per day    Review of Systems  Psychiatric/Behavioral: Negative for suicidal ideas.  All other systems reviewed and are negative.    Allergies  Review of patient's allergies indicates no known allergies.  Home Medications   Prior to Admission medications   Medication Sig Start Date End Date Taking? Authorizing Provider  acetaminophen (TYLENOL) 325 MG tablet Take 650 mg by mouth every 6 (six) hours as needed for mild pain, fever or headache.    Historical Provider, MD  FLUoxetine (PROZAC) 40 MG capsule Take 1 capsule (40 mg total) by mouth daily. 05/23/13   Nanine MeansJamison Lord, NP  lurasidone (LATUDA) 40 MG TABS tablet Take 1 tablet (40 mg  total) by mouth daily with breakfast. 05/23/13   Nanine MeansJamison Lord, NP   BP 113/85  Pulse 98  Temp(Src) 98.1 F (36.7 C)  Resp 18  SpO2 96%  Physical Exam  Nursing note and vitals reviewed. Constitutional: He appears well-developed and well-nourished. No distress.  Patient with dirt on clothes. He is in NAD.   HENT:  Head: Normocephalic and atraumatic.  Eyes: Conjunctivae and EOM are normal. No scleral icterus.  Neck: Normal range of motion.  Cardiovascular: Normal rate, regular rhythm and normal heart sounds.   Pulmonary/Chest: Effort normal and breath sounds normal. No respiratory distress. He has no wheezes. He has no rales.  Abdominal: Soft. He exhibits no distension. There is no tenderness.  Musculoskeletal: Normal range of motion.  Neurological: GCS eye subscore is 4. GCS verbal subscore is 5. GCS motor subscore is 6.  GCS 15. Patient alert and oriented. Patient speaks in full goal oriented sentences. He answers questions appropriately and follows simple commands. Speech is slurred c/w intoxication. Patient ambulatory with a steady gait. Moves extremities without ataxia.  Skin: Skin is warm and dry. No rash noted. He is not diaphoretic. No erythema. No pallor.  Psychiatric: His affect is inappropriate. His speech is slurred. He is agitated and aggressive. He expresses no homicidal and no suicidal ideation. He expresses no  suicidal plans and no homicidal plans.    ED Course  Procedures (including critical care time)  DIAGNOSTIC STUDIES: Oxygen Saturation is 96% on RA, adequate by my interpretation.    COORDINATION OF CARE: 11:12 PM- Pt advised of plan for treatment and pt agrees.  Labs Review Labs Reviewed - No data to display  Imaging Review No results found.   EKG Interpretation None      MDM   Final diagnoses:  Alcohol intoxication    Patient presents requesting detox. Patient has a hx of presentations to the ED for same. Past charts reviewed. Patient has hx of  [facility] or he requests to leave once sober. Do not believe patient's intentions of detox are sincere today. Believe he is motivated by his homelessness. 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  Patient making inappropriate comments to myself and staff. Patient telling me, "Get your pussy over here" and "I bet you've never seen a man like me". Patient yelling and cursing at staff and [employer]. He is visibly agitated and verbally aggressive. Patient has been ambulatory in ED without assistance and with steady gait. He has no SI/HI. VSS. Believe he is appropriate for discharge to jail. Patient ambulated out of ED unassisted, accompanied by [employer].  I personally performed the services described in this documentation, which was scribed in my presence. The recorded information has been reviewed and is accurate.    Antony MaduraKelly Damain Broadus, PA-C 07/15/13 2343

## 2013-07-16 NOTE — ED Provider Notes (Signed)
Medical screening examination/treatment/procedure(s) were performed by non-physician practitioner and as supervising physician I was immediately available for consultation/collaboration.   EKG Interpretation None        Caroline Longie M Ladana Chavero, MD 07/16/13 0825 

## 2013-08-03 ENCOUNTER — Encounter (HOSPITAL_COMMUNITY): Payer: Self-pay | Admitting: Emergency Medicine

## 2013-08-03 ENCOUNTER — Emergency Department (HOSPITAL_COMMUNITY)
Admission: EM | Admit: 2013-08-03 | Discharge: 2013-08-04 | Disposition: A | Discharge status: Home / Self Care | Payer: Self-pay | Attending: Emergency Medicine | Admitting: Emergency Medicine

## 2013-08-03 DIAGNOSIS — K8020 Calculus of gallbladder without cholecystitis without obstruction: Secondary | ICD-10-CM | POA: Insufficient documentation

## 2013-08-03 DIAGNOSIS — F17200 Nicotine dependence, unspecified, uncomplicated: Secondary | ICD-10-CM | POA: Insufficient documentation

## 2013-08-03 DIAGNOSIS — K8050 Calculus of bile duct without cholangitis or cholecystitis without obstruction: Secondary | ICD-10-CM

## 2013-08-03 DIAGNOSIS — Z8659 Personal history of other mental and behavioral disorders: Secondary | ICD-10-CM | POA: Insufficient documentation

## 2013-08-03 NOTE — ED Notes (Signed)
Pt states started having R sided abdominal pain 3 days ago w/ nausea and vomiting, denies diarrhea.

## 2013-08-04 ENCOUNTER — Emergency Department (HOSPITAL_COMMUNITY): Payer: Self-pay

## 2013-08-04 LAB — CBC WITH DIFFERENTIAL/PLATELET
Basophils Absolute: 0.1 10*3/uL (ref 0.0–0.1)
Basophils Relative: 0 % (ref 0–1)
EOS PCT: 1 % (ref 0–5)
Eosinophils Absolute: 0.1 10*3/uL (ref 0.0–0.7)
HCT: 34.5 % — ABNORMAL LOW (ref 39.0–52.0)
Hemoglobin: 12.2 g/dL — ABNORMAL LOW (ref 13.0–17.0)
LYMPHS ABS: 1.4 10*3/uL (ref 0.7–4.0)
Lymphocytes Relative: 10 % — ABNORMAL LOW (ref 12–46)
MCH: 33.2 pg (ref 26.0–34.0)
MCHC: 35.4 g/dL (ref 30.0–36.0)
MCV: 93.8 fL (ref 78.0–100.0)
MONO ABS: 1.1 10*3/uL — AB (ref 0.1–1.0)
Monocytes Relative: 8 % (ref 3–12)
Neutro Abs: 10.6 10*3/uL — ABNORMAL HIGH (ref 1.7–7.7)
Neutrophils Relative %: 80 % — ABNORMAL HIGH (ref 43–77)
PLATELETS: 256 10*3/uL (ref 150–400)
RBC: 3.68 MIL/uL — AB (ref 4.22–5.81)
RDW: 12.8 % (ref 11.5–15.5)
WBC: 13.2 10*3/uL — ABNORMAL HIGH (ref 4.0–10.5)

## 2013-08-04 LAB — URINALYSIS, ROUTINE W REFLEX MICROSCOPIC
Bilirubin Urine: NEGATIVE
Glucose, UA: NEGATIVE mg/dL
Hgb urine dipstick: NEGATIVE
Ketones, ur: NEGATIVE mg/dL
LEUKOCYTES UA: NEGATIVE
NITRITE: NEGATIVE
PROTEIN: 30 mg/dL — AB
SPECIFIC GRAVITY, URINE: 1.018 (ref 1.005–1.030)
UROBILINOGEN UA: 1 mg/dL (ref 0.0–1.0)
pH: 8.5 — ABNORMAL HIGH (ref 5.0–8.0)

## 2013-08-04 LAB — COMPREHENSIVE METABOLIC PANEL
ALT: 36 U/L (ref 0–53)
AST: 42 U/L — ABNORMAL HIGH (ref 0–37)
Albumin: 3.5 g/dL (ref 3.5–5.2)
Alkaline Phosphatase: 82 U/L (ref 39–117)
BUN: 8 mg/dL (ref 6–23)
CALCIUM: 9.2 mg/dL (ref 8.4–10.5)
CO2: 26 meq/L (ref 19–32)
CREATININE: 0.66 mg/dL (ref 0.50–1.35)
Chloride: 92 mEq/L — ABNORMAL LOW (ref 96–112)
GLUCOSE: 101 mg/dL — AB (ref 70–99)
Potassium: 3.8 mEq/L (ref 3.7–5.3)
Sodium: 135 mEq/L — ABNORMAL LOW (ref 137–147)
Total Bilirubin: 0.4 mg/dL (ref 0.3–1.2)
Total Protein: 8.1 g/dL (ref 6.0–8.3)

## 2013-08-04 LAB — URINE MICROSCOPIC-ADD ON

## 2013-08-04 LAB — LIPASE, BLOOD: LIPASE: 47 U/L (ref 11–59)

## 2013-08-04 MED ORDER — OXYCODONE-ACETAMINOPHEN 5-325 MG PO TABS: 1.0000 | ORAL_TABLET | ORAL | Status: DC | PRN

## 2013-08-04 MED ORDER — HYDROMORPHONE HCL PF 1 MG/ML IJ SOLN
1.0000 mg | Freq: Once | INTRAMUSCULAR | Status: AC
  Administered 2013-08-04: 1 mg via INTRAVENOUS
  Filled 2013-08-04: qty 1

## 2013-08-04 MED ORDER — ONDANSETRON HCL 4 MG/2ML IJ SOLN
4.0000 mg | Freq: Once | INTRAMUSCULAR | Status: AC
  Administered 2013-08-04: 4 mg via INTRAVENOUS
  Filled 2013-08-04: qty 2

## 2013-08-04 MED ORDER — PROMETHAZINE HCL 25 MG PO TABS: 25.0000 mg | ORAL_TABLET | Freq: Four times a day (QID) | ORAL | Status: DC | PRN

## 2013-08-04 MED ORDER — SODIUM CHLORIDE 0.9 % IV SOLN
1000.0000 mL | INTRAVENOUS | Status: DC
  Administered 2013-08-04: 1000 mL via INTRAVENOUS

## 2013-08-04 MED ORDER — SODIUM CHLORIDE 0.9 % IV SOLN
1000.0000 mL | Freq: Once | INTRAVENOUS | Status: AC
  Administered 2013-08-04: 1000 mL via INTRAVENOUS

## 2013-08-04 NOTE — ED Notes (Signed)
Pt still unable to void

## 2013-08-04 NOTE — Discharge Instructions (Signed)
Cholelithiasis °Cholelithiasis (also called gallstones) is a form of gallbladder disease in which gallstones form in your gallbladder. The gallbladder is an organ that stores bile made in the liver, which helps digest fats. Gallstones begin as small crystals and slowly grow into stones. Gallstone pain occurs when the gallbladder spasms and a gallstone is blocking the duct. Pain can also occur when a stone passes out of the duct.  °RISK FACTORS °· Being male.   °· Having multiple pregnancies. Health care providers sometimes advise removing diseased gallbladders before future pregnancies.   °· Being obese. °· Eating a diet heavy in fried foods and fat.   °· Being older than 60 years and increasing age.   °· Prolonged use of medicines containing male hormones.   °· Having diabetes mellitus.   °· Rapidly losing weight.   °· Having a family history of gallstones (heredity).   °SYMPTOMS °· Nausea.   °· Vomiting. °· Abdominal pain.   °· Yellowing of the skin (jaundice).   °· Sudden pain. It may persist from several minutes to several hours. °· Fever.   °· Tenderness to the touch.  °In some cases, when gallstones do not move into the bile duct, people have no pain or symptoms. These are called "silent" gallstones.  °TREATMENT °Silent gallstones do not need treatment. In severe cases, emergency surgery may be required. Options for treatment include: °· Surgery to remove the gallbladder. This is the most common treatment. °· Medicines. These do not always work and may take 6 12 months or more to work. °· Shock wave treatment (extracorporeal biliary lithotripsy). In this treatment an ultrasound machine sends shock waves to the gallbladder to break gallstones into smaller pieces that can pass into the intestines or be dissolved by medicine. °HOME CARE INSTRUCTIONS  °· Only take over-the-counter or prescription medicines for pain, discomfort, or fever as directed by your health care provider.   °· Follow a low-fat diet until  seen again by your health care provider. Fat causes the gallbladder to contract, which can result in pain.   °· Follow up with your health care provider as directed. Attacks are almost always recurrent and surgery is usually required for permanent treatment.   °SEEK IMMEDIATE MEDICAL CARE IF:  °· Your pain increases and is not controlled by medicines.   °· You have a fever or persistent symptoms for more than 2 3 days.   °· You have a fever and your symptoms suddenly get worse.   °· You have persistent nausea and vomiting.   °MAKE SURE YOU:  °· Understand these instructions. °· Will watch your condition. °· Will get help right away if you are not doing well or get worse. °Document Released: 03/06/2005 Document Revised: 11/10/2012 Document Reviewed: 09/01/2012 °ExitCare® Patient Information ©2014 [employer]. ° °Biliary Colic  °Biliary colic is a steady or irregular pain in the upper abdomen. It is usually under the right side of the rib cage. It happens when gallstones interfere with the normal flow of bile from the gallbladder. Bile is a liquid that helps to digest fats. Bile is made in the liver and stored in the gallbladder. When you eat a meal, bile passes from the gallbladder through the cystic duct and the common bile duct into the small intestine. There, it mixes with partially digested food. If a gallstone blocks either of these ducts, the normal flow of bile is blocked. The muscle cells in the bile duct contract forcefully to try to move the stone. This causes the pain of biliary colic.  °SYMPTOMS  °· A person with biliary colic usually complains of pain   in the upper abdomen. This pain can be: °· In the center of the upper abdomen just below the breastbone. °· In the upper-right part of the abdomen, near the gallbladder and liver. °· Spread back toward the right shoulder blade. °· Nausea and vomiting. °· The pain usually occurs after eating. °· Biliary colic is usually triggered by the digestive system's  demand for bile. The demand for bile is high after fatty meals. Symptoms can also occur when a person who has been fasting suddenly eats a very large meal. Most episodes of biliary colic pass after 1 to 5 hours. After the most intense pain passes, your abdomen may continue to ache mildly for about 24 hours. °DIAGNOSIS  °After you describe your symptoms, your caregiver will perform a physical exam. He or she will pay attention to the upper right portion of your belly (abdomen). This is the area of your liver and gallbladder. An ultrasound will help your caregiver look for gallstones. Specialized scans of the gallbladder may also be done. Blood tests may be done, especially if you have fever or if your pain persists. °PREVENTION  °Biliary colic can be prevented by controlling the risk factors for gallstones. Some of these risk factors, such as heredity, increasing age, and pregnancy are a normal part of life. Obesity and a high-fat diet are risk factors you can change through a healthy lifestyle. Women going through menopause who take hormone replacement therapy (estrogen) are also more likely to develop biliary colic. °TREATMENT  °· Pain medication may be prescribed. °· You may be encouraged to eat a fat-free diet. °· If the first episode of biliary colic is severe, or episodes of colic keep retuning, surgery to remove the gallbladder (cholecystectomy) is usually recommended. This procedure can be done through small incisions using an instrument called a laparoscope. The procedure often requires a brief stay in the hospital. Some people can leave the hospital the same day. It is the most widely used treatment in people troubled by painful gallstones. It is effective and safe, with no complications in more than 90% of cases. °· If surgery cannot be done, medication that dissolves gallstones may be used. This medication is expensive and can take months or years to work. Only small stones will dissolve. °· Rarely,  medication to dissolve gallstones is combined with a procedure called shock-wave lithotripsy. This procedure uses carefully aimed shock waves to break up gallstones. In many people treated with this procedure, gallstones form again within a few years. °PROGNOSIS  °If gallstones block your cystic duct or common bile duct, you are at risk for repeated episodes of biliary colic. There is also a 25% chance that you will develop a gallbladder infection(acute cholecystitis), or some other complication of gallstones within 10 to 20 years. If you have surgery, schedule it at a time that is convenient for you and at a time when you are not sick. °HOME CARE INSTRUCTIONS  °· Drink plenty of clear fluids. °· Avoid fatty, greasy or fried foods, or any foods that make your pain worse. °· Take medications as directed. °SEEK MEDICAL CARE IF:  °· You develop a fever over 100.5° F (38.1° C). °· Your pain gets worse over time. °· You develop nausea that prevents you from eating and drinking. °· You develop vomiting. °SEEK IMMEDIATE MEDICAL CARE IF:  °· You have continuous or severe belly (abdominal) pain which is not relieved with medications. °· You develop nausea and vomiting which is not relieved with medications. °·   You have symptoms of biliary colic and you suddenly develop a fever and shaking chills. This may signal cholecystitis. Call your caregiver immediately. °· You develop a yellow color to your skin or the white part of your eyes (jaundice). °Document Released: 08/11/2005 Document Revised: 06/02/2011 Document Reviewed: 10/21/2007 °ExitCare® Patient Information ©2014 [employer]. ° °

## 2013-08-04 NOTE — ED Provider Notes (Signed)
CSN: [id number]     Arrival date & time 08/03/13  2257 History   First MD Initiated Contact with Patient 08/04/13 0041     Chief Complaint  Patient presents with  . Abdominal Pain    right      HPI Patient reports right-sided abdominal pain with associated nausea and vomiting over the past 3 days.  His pain has been waxing and waning.  His nausea and vomiting his been severe.  His had decreased oral intake today.  He drinks alcohol daily but has not had any alcohol today.  His pain is moderate in severity.  No fevers chills   Past Medical History  Diagnosis Date  . Alcohol abuse   . Schizophrenia   . Bipolar 1 disorder   . Mental disorder   . Depression    Past Surgical History  Procedure Laterality Date  . Skin graft     No family history on file. History  Substance Use Topics  . Smoking status: Current Every Day Smoker -- 2.00 packs/day for 30 years    Types: Cigarettes  . Smokeless tobacco: Not on file  . Alcohol Use: 12.6 oz/week    21 Cans of beer per week     Comment: drinks as much etoh as he can get per day    Review of Systems  All other systems reviewed and are negative.     Allergies  Review of patient's allergies indicates no known allergies.  Home Medications   Prior to Admission medications   Medication Sig Start Date End Date Taking? Authorizing Provider  oxyCODONE-acetaminophen (PERCOCET/ROXICET) 5-325 MG per tablet Take 1 tablet by mouth every 4 (four) hours as needed for severe pain. 08/04/13   Lyanne CoKevin M Malayzia Laforte, MD  promethazine (PHENERGAN) 25 MG tablet Take 1 tablet (25 mg total) by mouth every 6 (six) hours as needed for nausea or vomiting. 08/04/13   Lyanne CoKevin M Kiasia Chou, MD   BP 125/65  Pulse 74  Temp(Src) 98.4 F (36.9 C) (Oral)  Resp 16  Ht 5\' 8"  (1.727 m)  Wt 155 lb (70.308 kg)  BMI 23.57 kg/m2  SpO2 91% Physical Exam  Nursing note and vitals reviewed. Constitutional: He is oriented to person, place, and time. He appears well-developed  and well-nourished.  HENT:  Head: Normocephalic and atraumatic.  Eyes: EOM are normal.  Neck: Normal range of motion.  Cardiovascular: Normal rate, regular rhythm, normal heart sounds and intact distal pulses.   Pulmonary/Chest: Effort normal and breath sounds normal. No respiratory distress.  Abdominal: Soft. He exhibits no distension.  Mild tenderness in the right upper quadrant.  No guarding or rebound.  Negative Murphy sign  Musculoskeletal: Normal range of motion.  Neurological: He is alert and oriented to person, place, and time.  Skin: Skin is warm and dry.  Psychiatric: He has a normal mood and affect. Judgment normal.    ED Course  Procedures (including critical care time) Labs Review Labs Reviewed  CBC WITH DIFFERENTIAL - Abnormal; Notable for the following:    WBC 13.2 (*)    RBC 3.68 (*)    Hemoglobin 12.2 (*)    HCT 34.5 (*)    Neutrophils Relative % 80 (*)    Neutro Abs 10.6 (*)    Lymphocytes Relative 10 (*)    Monocytes Absolute 1.1 (*)    All other components within normal limits  COMPREHENSIVE METABOLIC PANEL - Abnormal; Notable for the following:    Sodium 135 (*)    Chloride  92 (*)    Glucose, Bld 101 (*)    AST 42 (*)    All other components within normal limits  URINALYSIS, ROUTINE W REFLEX MICROSCOPIC - Abnormal; Notable for the following:    pH 8.5 (*)    Protein, ur 30 (*)    All other components within normal limits  LIPASE, BLOOD  URINE MICROSCOPIC-ADD ON    Imaging Review [place] Abdomen Complete  08/04/2013   CLINICAL DATA:  Right-sided abdominal pain and nausea and vomiting for 3 days.  EXAM: ULTRASOUND ABDOMEN COMPLETE  COMPARISON:  DG ABD ACUTE W/CHEST dated 02/19/2012  FINDINGS: Gallbladder:  Cholelithiasis with 2 small mobile stones measuring about 5 mm each. No wall thickening, sludge, or edema. Murphy's sign is negative.  Common bile duct:  Diameter: 5.1 mm, normal  Liver:  No focal lesion identified. Within normal limits in parenchymal  echogenicity.  IVC:  No abnormality visualized.  Pancreas:  Visualized portion unremarkable.  Spleen:  Size and appearance within normal limits.  Right Kidney:  Length: 11.2 cm. Echogenicity within normal limits. No mass or hydronephrosis visualized.  Left Kidney:  Length: 11.8 cm. Echogenicity within normal limits. No mass or hydronephrosis visualized.  Abdominal aorta:  Portions obscured by bowel gas. Visualized portion is non aneurysmal.  Other findings:  None.  IMPRESSION: Cholelithiasis with 2 small stones in the gallbladder. No inflammatory changes. Examination is otherwise unremarkable.   Electronically Signed   By: Burman NievesWilliam  Stevens M.D.   On: 08/04/2013 03:20  I personally reviewed the imaging tests through PACS system I reviewed available ER/hospitalization records through the EMR    EKG Interpretation None      MDM   Final diagnoses:  Cholelithiasis  Biliary colic    5:12 AM Patient feels much better at this time.  Is keeping oral fluids down.  Abdominal exam demonstrates no significant right upper quadrant abdominal pain.  Cholelithiasis noted.  Labs without significant abnormality except for leukocytosis.  Nonspecific.  Low suspicion for cholecystitis at this time.  He understands to return to the ER for new or worsening symptoms.  Home with pain medication and outpatient general surgery followup.    Lyanne CoKevin M Arlanda Shiplett, MD 08/04/13 [phone]

## 2013-08-04 NOTE — ED Notes (Signed)
Pt is currently taking a shower in TCU  NT is in TCU waiting for Pt.

## 2013-08-04 NOTE — ED Notes (Signed)
Pt is still unable to void.

## 2013-08-04 NOTE — ED Notes (Signed)
Need for urine is noted. The Pt has a urinal at the bedside to use when he is ready.

## 2013-08-10 ENCOUNTER — Encounter (HOSPITAL_COMMUNITY): Payer: Self-pay | Admitting: Emergency Medicine

## 2013-08-10 ENCOUNTER — Emergency Department (HOSPITAL_COMMUNITY)
Admission: EM | Admit: 2013-08-10 | Discharge: 2013-08-10 | Disposition: A | Discharge status: Home / Self Care | Payer: Self-pay | Attending: Emergency Medicine | Admitting: Emergency Medicine

## 2013-08-10 DIAGNOSIS — Z5900 Homelessness unspecified: Secondary | ICD-10-CM | POA: Insufficient documentation

## 2013-08-10 DIAGNOSIS — Z8659 Personal history of other mental and behavioral disorders: Secondary | ICD-10-CM | POA: Insufficient documentation

## 2013-08-10 DIAGNOSIS — K8050 Calculus of bile duct without cholangitis or cholecystitis without obstruction: Secondary | ICD-10-CM

## 2013-08-10 DIAGNOSIS — F17200 Nicotine dependence, unspecified, uncomplicated: Secondary | ICD-10-CM | POA: Insufficient documentation

## 2013-08-10 DIAGNOSIS — K8020 Calculus of gallbladder without cholecystitis without obstruction: Secondary | ICD-10-CM | POA: Insufficient documentation

## 2013-08-10 DIAGNOSIS — F10229 Alcohol dependence with intoxication, unspecified: Secondary | ICD-10-CM | POA: Insufficient documentation

## 2013-08-10 DIAGNOSIS — F1020 Alcohol dependence, uncomplicated: Secondary | ICD-10-CM

## 2013-08-10 LAB — COMPREHENSIVE METABOLIC PANEL
ALBUMIN: 3.6 g/dL (ref 3.5–5.2)
ALK PHOS: 78 U/L (ref 39–117)
ALT: 26 U/L (ref 0–53)
AST: 43 U/L — ABNORMAL HIGH (ref 0–37)
BUN: 7 mg/dL (ref 6–23)
CHLORIDE: 96 meq/L (ref 96–112)
CO2: 21 mEq/L (ref 19–32)
Calcium: 9.1 mg/dL (ref 8.4–10.5)
Creatinine, Ser: 0.59 mg/dL (ref 0.50–1.35)
GFR calc Af Amer: >90 mL/min (ref >90)
GFR calc non Af Amer: >90 mL/min (ref >90)
Glucose, Bld: 104 mg/dL — ABNORMAL HIGH (ref 70–99)
POTASSIUM: 3.9 meq/L (ref 3.7–5.3)
Sodium: 137 mEq/L (ref 137–147)
Total Bilirubin: <0.2 mg/dL — ABNORMAL LOW (ref 0.3–1.2)
Total Protein: 7.9 g/dL (ref 6.0–8.3)

## 2013-08-10 LAB — CBC WITH DIFFERENTIAL/PLATELET
BASOS ABS: 0.1 10*3/uL (ref 0.0–0.1)
Basophils Relative: 2 % — ABNORMAL HIGH (ref 0–1)
Eosinophils Absolute: 1 10*3/uL — ABNORMAL HIGH (ref 0.0–0.7)
Eosinophils Relative: 14 % — ABNORMAL HIGH (ref 0–5)
HEMATOCRIT: 34.7 % — AB (ref 39.0–52.0)
Hemoglobin: 12.6 g/dL — ABNORMAL LOW (ref 13.0–17.0)
LYMPHS PCT: 33 % (ref 12–46)
Lymphs Abs: 2.4 10*3/uL (ref 0.7–4.0)
MCH: 34.5 pg — ABNORMAL HIGH (ref 26.0–34.0)
MCHC: 36.3 g/dL — ABNORMAL HIGH (ref 30.0–36.0)
MCV: 95.1 fL (ref 78.0–100.0)
Monocytes Absolute: 0.8 10*3/uL (ref 0.1–1.0)
Monocytes Relative: 11 % (ref 3–12)
NEUTROS ABS: 2.9 10*3/uL (ref 1.7–7.7)
Neutrophils Relative %: 40 % — ABNORMAL LOW (ref 43–77)
PLATELETS: 372 10*3/uL (ref 150–400)
RBC: 3.65 MIL/uL — ABNORMAL LOW (ref 4.22–5.81)
RDW: 12.9 % (ref 11.5–15.5)
WBC: 7.2 10*3/uL (ref 4.0–10.5)

## 2013-08-10 LAB — LIPASE, BLOOD: Lipase: 46 U/L (ref 11–59)

## 2013-08-10 LAB — ETHANOL: Alcohol, Ethyl (B): 222 mg/dL — ABNORMAL HIGH (ref 0–11)

## 2013-08-10 MED ORDER — FENTANYL CITRATE 0.05 MG/ML IJ SOLN
50.0000 ug | Freq: Once | INTRAMUSCULAR | Status: AC
  Administered 2013-08-10: 50 ug via INTRAVENOUS
  Filled 2013-08-10: qty 2

## 2013-08-10 MED ORDER — ONDANSETRON HCL 4 MG/2ML IJ SOLN
4.0000 mg | Freq: Once | INTRAMUSCULAR | Status: AC
  Administered 2013-08-10: 4 mg via INTRAVENOUS
  Filled 2013-08-10: qty 2

## 2013-08-10 MED ORDER — OXYCODONE HCL 5 MG PO TABS
10.0000 mg | ORAL_TABLET | Freq: Once | ORAL | Status: AC
  Administered 2013-08-10: 10 mg via ORAL
  Filled 2013-08-10: qty 2

## 2013-08-10 MED ORDER — OXYCODONE HCL 5 MG PO TABS: 5.0000 mg | ORAL_TABLET | ORAL | Status: DC | PRN

## 2013-08-10 MED ORDER — PROMETHAZINE HCL 25 MG PO TABS: 25.0000 mg | ORAL_TABLET | Freq: Four times a day (QID) | ORAL | Status: DC | PRN

## 2013-08-10 MED ORDER — ONDANSETRON 8 MG PO TBDP: 8.0000 mg | ORAL_TABLET | Freq: Three times a day (TID) | ORAL | Status: DC | PRN

## 2013-08-10 NOTE — Discharge Instructions (Signed)
Avoid fatty or greasy foods as this will worsen your abdominal pain.  Try to avoid alcohol.  Follow up with surgery as scheduled.  TAKE MEDICATIONS AS PRESCRIBED!!  Return to the ER for worsening pain, vomiting despite medications, fever, or other new concerning symptoms.   PAIN ACETAMINOPHEN OXYCODONE  PAIN ACETAMINOPHEN OXYCODONE: You have been given a medication that contains acetaminophen and oxycodone.      This medication is used to relieve pain.     DO NOT take this medication if you have liver disease or drink alcohol on a daily basis.     DO NOT take this medication if you are taking other over-the-counter medications that contain Tylenol or acetaminophen (the active ingredient in Tylenol).     If you have side-effects that you think are caused by this medicine, tell your doctor.     DO NOT drink alcoholic beverages while taking this medicine.     If you become dizzy, sit or lie down at the first signs.  You should be careful going up and down stairs.     If you are pregnant or breastfeeding, notify your doctor before taking this medication.     Keep this medication out of the reach of children.  Always keep this medication in child-proof containers.  DO NOT give your medication to anyone else. This medication can be HABIT-FORMING.  Discontinue use when no longer needed and never give this medication to others.  You have been given a medication, or a prescription for a medication, that causes drowsiness or dizziness.  DO NOT drive a car, operate machinery, or perform jobs that require you to be alert until you know how you are going to react to this medicine.  THESE INSTRUCTIONS ARE NOT COMPREHENSIVE (complete):  Ask your pharmacist for additional information and precautions for this medication.   GI ANTIEMETIC  GI ANTIEMETIC: You have been given a prescription for a medication for nausea and vomiting.      It is OK to take this medication if you are pregnant.  Be sure  to tell your regular doctor or obstetrician [facility] doctor) that you have been taking this medication.     Take this medication as directed.     If you are taking phenobarbital, narcotic pain medications, antidepressants, or sleeping pills your dosage may need to be adjusted.  Be sure to inform your doctor of all the other medications that you are taking.     DO NOT take this medication if you have liver disease or heart disease.     DO NOT take pain killers (narcotic medication) unless specifically instructed to do so by your doctor     DO NOT drink alcoholic beverages while taking this medicine.     If you develop any reactions that you believe may be from the medication be sure to tell your doctor or return to the ER (Some reactions may include:  dizziness, shaking, visual disturbances, nervousness, fainting, rash).     If you become dizzy, sit or lie down at the first signs.  You should be careful going up and down stairs.     Keep this medication out of the reach of children.  Always keep this medication in child-proof containers.  DO NOT give your medication to anyone else. You have been given a medication, or a prescription for a medication, that causes drowsiness or dizziness.  DO NOT drive a car, operate machinery, ride a bike, or perform jobs that require you to  be alert until you know how you are going to react to this medication.  THESE INSTRUCTIONS ARE NOT COMPREHENSIVE (complete):  Ask your pharmacist for additional information and precautions for this medication.   LOW FAT DIET  Low Fat Diet  Your doctor wants you to be on a low fat diet.  This diet will be helpful if you want to lose weight or if you have problems with your liver, pancreas, gallbladder.  FOODS ALLOWED:    Beverages: All, except those not allowed.   Evaporated skim milk.     Breads: All enriched or whole grain bread, bread sticks, graham crackers, melba toast, pretzels, rye wafers, matzoh, saltines, bagels.       Cereals: All cooked without fat or dry.     Desserts: All fruit, diet puddings, gelatin, dessert made with egg white, angel food cake, fruit ice, sherbet.     Eggs: Not more than one egg yolk daily, whites okay.  Cholesterol free egg substitutes, such as "egg beaters."     Fat: One teaspoon each meal or 3 teaspoons per day; butter, mayonnaise, margarine, oil. (May be used in cooking if omitted at meals) Fat free salad dressings and gravy.     Fruits: All fresh, frozen, or canned fruit or fruit juice. One citrus fruit every day.     Meats, Fish, Poultry & Cheese: Remove visible fat from meat before cooking. Baked, broiled, boiled, roasted, stewed, simmered; lean fish, meat, poultry, seafood.  Water packed salmon and tuna.  Lowfat cottage cheese, skim milk cheese, ricotta, parmesan, farmer's cheese, lowfat yogurt and tofu.     Potatoes & Substitutes: Macaroni, noodles, rice, spaghetti, sweet or white potato; prepared without fat, unless used in amount allowed.     Soups: Bouillon, cream soups made with vegetables and skim milk, fat free meat and poultry soups.     Sweets: Honey, jam, jelly, marshmallows, molasses, sugar, syrup, candies; hard, Life Savers, gum drops, jelly beans, sour balls.     Vegetables: All fresh, frozen, canned or juiced vegetables allowed.  FOODS NOT ALLOWED:    Beverages: Cream, 2% or whole milk, chocolate milk, condensed milk, evaporated or malted milk or shakes, 1/2 & 1/2.     Breads: Quick breads, muffins, biscuits, pancakes, corn bread, sweet rolls, any fried breads.     Cereals: Bran, if it causes distress, wheat germ.     Desserts: Dessert made with whole milk, cream, butter, lard, oil, coconut, nuts, or chocolate.     Eggs: Eggs prepared with whole milk or fat. Fried eggs.     Fat: More than 1 teaspoon per meal.  Bacon, bacon fat, ham fat, lard, salt pork, shortening, gravy, salad dressings, non-dairy creamers.     Fruits: [place].  Any fruit that  causes distress.     Meats, Fish, [employer] forces training and education officer: All fried or fatty.  Sausage, prime rib, frankfurters, luncheon meats, fish canned in oil, duck, goose, poultry skin, spiced or pickled meats, cheese (except those allowed), whole milk yogurt.  Peanut butter limited to 1 Tbs. day.     Potatoes & Substitutes: Cooked with fat or oil, fried potatoes, potato chips, cream sauces (unless made with skim milk).     Sweets: Chocolate, coconut, nuts, caramels.                Vegetables: Avocado, any cooked in fat or that cause distress.    BILIARY COLIC  BILIARY COLIC: You have been diagnosed with biliary colic.  Biliary colic is  the term used to describe crampy pain from a gallbladder that contains gallstones.  The gallbladder is a small sack that hangs from the liver. It stores a liquid called bile. Bile is produced by the liver. When you eat, the gallbladder squeezes bile through a duct or tube into the intestines to help with the digestion of fat.  Gallstones develop from crystals of bile. The stone may be smaller than a pea, or as large as a golf ball. There may be one or several stones. The stone may block the tube that drains the bile from the gallbladder. This blockage causes spasm of the gallbladder and pain.  The pain usually comes and goes and is crampy.  It often starts after eating foods that contain a lot of fat.  You may also have nausea and vomiting with the pain.  Biliary colic is usually treated with pain medications. You may also be given a medication for the nausea and vomiting.  You should avoid eating foods that are fried or contain a lot of fat.  If you have more episodes of pain, you may need to have your gall bladder removed.      You should contact your family doctor for a referral to a general surgeon. YOU SHOULD SEEK MEDICAL ATTENTION IMMEDIATELY, EITHER HERE OR AT THE NEAREST EMERGENCY DEPARTMENT, IF ANY OF THE FOLLOWING OCCURS:      Increasing pain or pain that  does not go away.     Persistent vomiting or if you are not able to keep any fluids down.     Fever or shaking chills.     Yellowing of your skin or eyes, or dark, brown-colored urine.  If you develop symptoms of Shortness of Breath, Chest Pain, Swelling of lips, mouth or tongue or if your condition becomes worse with any new symptoms, see your doctor or return to the Emergency Department for immediate care. Emergency services are not intended to be a substitute for comprehensive medical attention.  Please contact your doctor for follow up if not improving as expected.   Call your doctor in 5-7 days or as directed if there is no improvement.   Community Resources: *IF YOU ARE IN IMMEDIATE DANGER CALL 911!  Abuse/Neglect:  Family Services Crisis Hotline [facility]([place]): [phone] [facility] Against Violence [facility]([place]): [phone]  After hours, holidays and weekends: [phone] National Domestic Violence Hotline: 629-605-1672203-089-4720  Mental Health: [facility]: [address]: [phone]  Health Clinics:  [facility]): [phone] Monday - Friday 8 AM - 9 PM, Saturday and Sunday 10 AM - 9 PM  [facility] [address]: [phone] Monday - Friday 8 AM - 5 PM  [facility]  [address]: [phone] Monday- Friday 8:30 AM - 5:30 PM, Sat 9 AM - 1 PM  [facility] [address]: [phone] [facility]: [phone] [facility] [address]: [phone]  24 [facility]: [address] [phone]  Cultures: If culture results are positive, we will notify you if a change in treatment is necessary.  LABORATORY TESTS:         If you had any labs drawn in the ED that have not resulted by the time you are discharged home, we will review these lab results and the treatment given to you.  If there is any further treatment or  notification needed, we will  contact you by phone, or letter.  "PLEASE ENSURE THAT YOU HAVE GIVEN US YOUR CURRENT WORKING PHONE NUMBER AND YOUR CURRENT ADDRESS, so that we can contact you if needed."  RADIOLOGY TESTS:  If the referred physician wants todays x-rays, please call the hospitals Radiology Department the day before your doctors appointment. [facility]     [phone] [address]   [phone] [address]     [phone]  Our doctors and staff appreciate your choosing us for your emergency medical care needs. We are here to serve you.

## 2013-08-10 NOTE — ED Provider Notes (Signed)
CSN: [id number]     Arrival date & time 08/10/13  0256 History   First MD Initiated Contact with Patient 08/10/13 0302     Chief Complaint  Patient presents with  . Abdominal Pain     (Consider location/radiation/quality/duration/timing/severity/associated sxs/prior Treatment) HPI 58 year old male presents to the emergency department with complaint of upper abdominal pain, nausea and vomiting.  Patient was diagnosed with gallstones on May 14.  He has followup with surgery scheduled on 5/25.  Patient reports after eating a hamburger today, his pain returned and he had nausea and vomiting.  He also reports that all the beer he drank today also came up.  Patient has history of alcohol abuse, schizophrenia.  No fevers or chills.  Patient denies being told he needed a specific diet for his gallstones.  He requests surgery tonight.  Patient reports past history of shaking when he goes too long without alcohol,, but reports he has never had DTs.  He reports he has been a steady drinker since the age of 58.  He reports he is planning to go to detox eventually Past Medical History  Diagnosis Date  . Alcohol abuse   . Schizophrenia   . Bipolar 1 disorder   . Mental disorder   . Depression    Past Surgical History  Procedure Laterality Date  . Skin graft     No family history on file. History  Substance Use Topics  . Smoking status: Current Every Day Smoker -- 2.00 packs/day for 30 years    Types: Cigarettes  . Smokeless tobacco: Not on file  . Alcohol Use: 12.6 oz/week    21 Cans of beer per week     Comment: drinks as much etoh as he can get per day    Review of Systems  See History of Present Illness; otherwise all other systems are reviewed and negative   Allergies  Review of patient's allergies indicates no known allergies.  Home Medications   Prior to Admission medications   Medication Sig Start Date End Date Taking? Authorizing Provider  oxyCODONE-acetaminophen  (PERCOCET/ROXICET) 5-325 MG per tablet Take 1 tablet by mouth every 4 (four) hours as needed for severe pain. 08/04/13   Lyanne CoKevin M Campos, MD  promethazine (PHENERGAN) 25 MG tablet Take 1 tablet (25 mg total) by mouth every 6 (six) hours as needed for nausea or vomiting. 08/04/13   Lyanne CoKevin M Campos, MD   BP 139/76  Pulse 95  Temp(Src) 98 F (36.7 C) (Oral)  Resp 16  SpO2 96% Physical Exam  Nursing note and vitals reviewed. Constitutional: He appears well-developed and well-nourished. He appears distressed (patient vomiting, appears intoxicated and uncomfortable).  HENT:  Head: Normocephalic and atraumatic.  Right Ear: External ear normal.  Left Ear: External ear normal.  Nose: Nose normal.  Mouth/Throat: Oropharynx is clear and moist.  Eyes: Conjunctivae and EOM are normal. Pupils are equal, round, and reactive to light.  Neck: Normal range of motion. Neck supple. No JVD present. No tracheal deviation present. No thyromegaly present.  Cardiovascular: Normal rate, regular rhythm, normal heart sounds and intact distal pulses.  Exam reveals no gallop and no friction rub.   No murmur heard. Pulmonary/Chest: Effort normal and breath sounds normal. No stridor. No respiratory distress. He has no wheezes. He has no rales. He exhibits no tenderness.  Abdominal: Soft. Bowel sounds are normal. He exhibits no distension and no mass. There is tenderness (tenderness in epigastrium and right upper quadrant without rebound or guarding). There  is no rebound and no guarding.  Musculoskeletal: Normal range of motion. He exhibits no edema and no tenderness.  Lymphadenopathy:    He has no cervical adenopathy.  Skin: Skin is warm and dry. No rash noted. No erythema. No pallor.  Psychiatric: He has a normal mood and affect. His behavior is normal. Judgment and thought content normal.    ED Course  Procedures (including critical care time) Labs Review Labs Reviewed  CBC WITH DIFFERENTIAL - Abnormal; Notable for  the following:    RBC 3.65 (*)    Hemoglobin 12.6 (*)    HCT 34.7 (*)    MCH 34.5 (*)    MCHC 36.3 (*)    Neutrophils Relative % 40 (*)    Eosinophils Relative 14 (*)    Basophils Relative 2 (*)    Eosinophils Absolute 1.0 (*)    All other components within normal limits  COMPREHENSIVE METABOLIC PANEL - Abnormal; Notable for the following:    Glucose, Bld 104 (*)    AST 43 (*)    Total Bilirubin <0.2 (*)    All other components within normal limits  ETHANOL - Abnormal; Notable for the following:    Alcohol, Ethyl (B) 222 (*)    All other components within normal limits  LIPASE, BLOOD    Imaging Review No results found.   EKG Interpretation None      MDM   Final diagnoses:  Biliary colic  Alcohol dependence    58 year old male with known cholelithiasis with vomiting after hamburger today.  Patient reports he has not filled the prescriptions given to him on his last visit.  Suspect diet indiscretions led to symptoms today.  We'll get labs, control pain and nausea.   5:15 AM Pt has tolerated po challenge.  Pt reports he has run out of his medications.  He has appointment with surgery scheduled.  He is intoxicated, but has ambulated here well without ataxia.  Plan to d/c home.  Olivia Mackielga M Demetrius Barrell, MD 08/10/13 [phone]

## 2013-08-10 NOTE — ED Notes (Signed)
Per EMS report: pt is homeless: pt c/o of RUQ pain.  Pt was dx with gallstones less than a week ago.  Pt reports having a surgery scheduled on May 25.  Pain has progressively gotten worse tonight.  Pt a/o x 4.  Pt endorses drinking etoh today.  Pt ambulatory into the department.

## 2013-08-10 NOTE — ED Notes (Signed)
Pt states that he did get his medications filled from the last visit.  Pt given graham crackers and gingerale.

## 2013-08-10 NOTE — ED Notes (Signed)
Pt ambulated to bathroom without difficulty.

## 2013-08-10 NOTE — ED Notes (Signed)
Pt states he is homeless and can't afford acid reflux medication so he takes baking soda for reflux and he believes that is why he has gallstones.  States he is nausea and vomiting

## 2013-08-10 NOTE — ED Notes (Signed)
Bed: [id number] Expected date:  Expected time:  Means of arrival:  Comments: EMS 57y/o male  Known gallstones,  In pain

## 2013-08-10 NOTE — ED Notes (Addendum)
Pt able to tolerate cracker and PO fluids without emesis.  Dr. Norlene Campbelltter made aware.

## 2013-08-11 ENCOUNTER — Emergency Department (HOSPITAL_COMMUNITY)
Admission: EM | Admit: 2013-08-11 | Discharge: 2013-08-11 | Disposition: A | Discharge status: Home / Self Care | Payer: Self-pay | Attending: Emergency Medicine | Admitting: Emergency Medicine

## 2013-08-11 ENCOUNTER — Encounter (HOSPITAL_COMMUNITY): Payer: Self-pay | Admitting: Emergency Medicine

## 2013-08-11 DIAGNOSIS — K8020 Calculus of gallbladder without cholecystitis without obstruction: Secondary | ICD-10-CM | POA: Insufficient documentation

## 2013-08-11 DIAGNOSIS — Z8659 Personal history of other mental and behavioral disorders: Secondary | ICD-10-CM | POA: Insufficient documentation

## 2013-08-11 DIAGNOSIS — F17200 Nicotine dependence, unspecified, uncomplicated: Secondary | ICD-10-CM | POA: Insufficient documentation

## 2013-08-11 DIAGNOSIS — R109 Unspecified abdominal pain: Secondary | ICD-10-CM

## 2013-08-11 LAB — CBC WITH DIFFERENTIAL/PLATELET
BASOS PCT: 1 % (ref 0–1)
Basophils Absolute: 0.1 10*3/uL (ref 0.0–0.1)
Eosinophils Absolute: 0.1 10*3/uL (ref 0.0–0.7)
Eosinophils Relative: 1 % (ref 0–5)
HCT: 34.6 % — ABNORMAL LOW (ref 39.0–52.0)
HEMOGLOBIN: 11.9 g/dL — AB (ref 13.0–17.0)
Lymphocytes Relative: 14 % (ref 12–46)
Lymphs Abs: 1.2 10*3/uL (ref 0.7–4.0)
MCH: 32.7 pg (ref 26.0–34.0)
MCHC: 34.4 g/dL (ref 30.0–36.0)
MCV: 95.1 fL (ref 78.0–100.0)
MONO ABS: 0.9 10*3/uL (ref 0.1–1.0)
MONOS PCT: 10 % (ref 3–12)
Neutro Abs: 6.6 10*3/uL (ref 1.7–7.7)
Neutrophils Relative %: 74 % (ref 43–77)
Platelets: 339 10*3/uL (ref 150–400)
RBC: 3.64 MIL/uL — ABNORMAL LOW (ref 4.22–5.81)
RDW: 12.9 % (ref 11.5–15.5)
WBC: 8.8 10*3/uL (ref 4.0–10.5)

## 2013-08-11 LAB — COMPREHENSIVE METABOLIC PANEL
ALBUMIN: 3.4 g/dL — AB (ref 3.5–5.2)
ALT: 20 U/L (ref 0–53)
AST: 35 U/L (ref 0–37)
Alkaline Phosphatase: 83 U/L (ref 39–117)
BILIRUBIN TOTAL: 0.3 mg/dL (ref 0.3–1.2)
BUN: 7 mg/dL (ref 6–23)
CHLORIDE: 93 meq/L — AB (ref 96–112)
CO2: 26 mEq/L (ref 19–32)
CREATININE: 0.58 mg/dL (ref 0.50–1.35)
Calcium: 9 mg/dL (ref 8.4–10.5)
GFR calc Af Amer: >90 mL/min (ref >90)
GFR calc non Af Amer: >90 mL/min (ref >90)
Glucose, Bld: 98 mg/dL (ref 70–99)
Potassium: 4.4 mEq/L (ref 3.7–5.3)
Sodium: 134 mEq/L — ABNORMAL LOW (ref 137–147)
Total Protein: 7.7 g/dL (ref 6.0–8.3)

## 2013-08-11 LAB — LIPASE, BLOOD: LIPASE: 48 U/L (ref 11–59)

## 2013-08-11 LAB — ETHANOL

## 2013-08-11 MED ORDER — PROMETHAZINE HCL 25 MG PO TABS: 25.0000 mg | ORAL_TABLET | Freq: Four times a day (QID) | ORAL | Status: DC | PRN

## 2013-08-11 MED ORDER — ONDANSETRON HCL 4 MG/2ML IJ SOLN
4.0000 mg | Freq: Once | INTRAMUSCULAR | Status: AC
  Administered 2013-08-11: 4 mg via INTRAVENOUS
  Filled 2013-08-11: qty 2

## 2013-08-11 MED ORDER — FENTANYL CITRATE 0.05 MG/ML IJ SOLN
50.0000 ug | Freq: Once | INTRAMUSCULAR | Status: AC
  Administered 2013-08-11: 50 ug via INTRAVENOUS
  Filled 2013-08-11: qty 2

## 2013-08-11 NOTE — ED Provider Notes (Signed)
CSN: [id number]     Arrival date & time 08/11/13  1840 History   First MD Initiated Contact with Patient 08/11/13 1854     Chief Complaint  Patient presents with  . Nausea  . Emesis     (Consider location/radiation/quality/duration/timing/severity/associated sxs/prior Treatment) The history is provided by the patient and medical records. No language interpreter was used.    Jimmy BrakemanJack L Lanphear is a 58 y.o. male  with a hx of EtOH abuse (since age 58), schizophrenia, depression, gallstones presents to the Emergency Department complaining of gradual, persistent, progressively worsening RUQ abd pain onset 6am.  Associated symptoms include emesis x6 that is NBNB.  Pt reports he has been eating crackers.  He reports he has continued to drink beer today but seems to vomit that very quickly.  Patient was diagnosed with gallstones on May 14. He has followup with surgery scheduled on 5/25.  Pt requests surgery tonight for his gallstones.  Nothing makes it better and nothing makes it worse.  Pt denies fever, chills, headache, neck pain, chest pain, SOB, diarrhea, weakness, dizziness, syncope, dysuria, hematuria.     Past Medical History  Diagnosis Date  . Alcohol abuse   . Schizophrenia   . Bipolar 1 disorder   . Mental disorder   . Depression    Past Surgical History  Procedure Laterality Date  . Skin graft     No family history on file. History  Substance Use Topics  . Smoking status: Current Every Day Smoker -- 2.00 packs/day for 30 years    Types: Cigarettes  . Smokeless tobacco: Not on file  . Alcohol Use: 12.6 oz/week    21 Cans of beer per week     Comment: drinks as much etoh as he can get per day    Review of Systems  Constitutional: Negative for fever, diaphoresis, appetite change, fatigue and unexpected weight change.  HENT: Negative for mouth sores and trouble swallowing.   Eyes: Negative for visual disturbance.  Respiratory: Negative for cough, chest tightness, shortness of  breath, wheezing and stridor.   Cardiovascular: Negative for chest pain and palpitations.  Gastrointestinal: Positive for nausea, vomiting and abdominal pain. Negative for diarrhea, constipation, blood in stool, abdominal distention and rectal pain.  Endocrine: Negative for polydipsia, polyphagia and polyuria.  Genitourinary: Negative for dysuria, urgency, frequency, hematuria, flank pain and difficulty urinating.  Musculoskeletal: Negative for back pain, neck pain and neck stiffness.  Skin: Negative for rash.  Allergic/Immunologic: Negative for immunocompromised state.  Neurological: Negative for syncope, weakness, light-headedness and headaches.  Hematological: Negative for adenopathy. Does not bruise/bleed easily.  Psychiatric/Behavioral: Negative for confusion and sleep disturbance. The patient is not nervous/anxious.   All other systems reviewed and are negative.     Allergies  Review of patient's allergies indicates no known allergies.  Home Medications   Prior to Admission medications   Medication Sig Start Date End Date Taking? Authorizing Provider  oxyCODONE-acetaminophen (PERCOCET/ROXICET) 5-325 MG per tablet Take 1 tablet by mouth every 4 (four) hours as needed for severe pain. 08/04/13  Yes Lyanne CoKevin M Campos, MD  ondansetron (ZOFRAN ODT) 8 MG disintegrating tablet Take 1 tablet (8 mg total) by mouth every 8 (eight) hours as needed for nausea or vomiting. 08/10/13   Olivia Mackielga M Otter, MD  oxyCODONE (OXY IR/ROXICODONE) 5 MG immediate release tablet Take 1 tablet (5 mg total) by mouth every 4 (four) hours as needed for severe pain. Take 1-2 tab po q 4-6 hours 08/10/13   Marian Sorrowlga  Kellie SimmeringM Otter, MD  promethazine (PHENERGAN) 25 MG tablet Take 1 tablet (25 mg total) by mouth every 6 (six) hours as needed for nausea or vomiting. 08/10/13   Olivia Mackielga M Otter, MD   BP 145/79  Pulse 79  Temp(Src) 99 F (37.2 C) (Oral)  Resp 16  SpO2 99% Physical Exam  Nursing note and vitals reviewed. Constitutional: He  appears well-developed and well-nourished. No distress.  Awake, alert, nontoxic appearance  HENT:  Head: Normocephalic and atraumatic.  Mouth/Throat: Oropharynx is clear and moist. No oropharyngeal exudate.  Eyes: Conjunctivae are normal. No scleral icterus.  Neck: Normal range of motion. Neck supple.  Cardiovascular: Normal rate, regular rhythm, normal heart sounds and intact distal pulses.   No murmur heard. Pulmonary/Chest: Effort normal and breath sounds normal. No respiratory distress. He has no wheezes. He has no rales.  Abdominal: Soft. Bowel sounds are normal. He exhibits no distension and no mass. There is tenderness in the right upper quadrant and epigastric area. There is guarding. There is no rebound and no CVA tenderness.  Right upper quadrant epigastric tenderness on exam with voluntary guarding No rigidity, rebound or peritoneal signs No CVA tenderness  Musculoskeletal: Normal range of motion. He exhibits no edema.  Neurological: He is alert.  Speech is clear and goal oriented Moves extremities without ataxia  Skin: Skin is warm and dry. He is not diaphoretic.  Psychiatric: He has a normal mood and affect.    ED Course  Procedures (including critical care time) Labs Review Labs Reviewed  CBC WITH DIFFERENTIAL - Abnormal; Notable for the following:    RBC 3.64 (*)    Hemoglobin 11.9 (*)    HCT 34.6 (*)    All other components within normal limits  COMPREHENSIVE METABOLIC PANEL - Abnormal; Notable for the following:    Sodium 134 (*)    Chloride 93 (*)    Albumin 3.4 (*)    All other components within normal limits  LIPASE, BLOOD  ETHANOL    Imaging Review No results found.   EKG Interpretation None      MDM   Final diagnoses:  Abdominal pain  Gallstones   Jimmy [name] presents with hx of gallstones and epigastric abd pain with c/o persistent nausea and vomiting.  Pt reports attempting to drink beer all day, but it causes emesis.  She alert,  oriented, nontoxic and nonseptic appearing. He is afebrile and not tachycardic. Emesis in the department. We'll obtain labs, treat pain and nausea and reassess.  8:30PM Labs unremarkable.  Patient pain resolved. No elevation in AST, ALT or lipase to suggest choledocholithiasis. The patient is afebrile and non-tachycardic.  I doubt cholecystitis at this time. Patient's abdomen is soft and nontender.  Will by mouth trial and reassess.  I do not believe the patient needs imaging at this time.   9:46 PM Repeat abd exam with soft and nontender. Pt reports pain as resolved completely.  Pt is tolerating solids and liquids for > 1 hour without emesis.    Patient is nontoxic, nonseptic appearing, in no apparent distress.  Patient's pain and other symptoms adequately managed in emergency department.  Fluid bolus given.  Labs and vitals reviewed.  Patient does not meet the SIRS or Sepsis criteria.  On repeat exam patient does not have a surgical abdomin and there are no peritoneal signs.  Negative Murphy's sign. No indication of appendicitis, bowel obstruction, bowel perforation, cholecystitis, diverticulitis.  Patient discharged home with symptomatic treatment and given strict instructions  for follow-up with their primary care physician.  I have also discussed reasons to return immediately to the ER including intractable vomiting, worsening pain or fevers..  Patient expresses understanding and agrees with plan.   It has been determined that no acute conditions requiring further emergency intervention are present at this time. The patient/guardian have been advised of the diagnosis and plan. We have discussed signs and symptoms that warrant return to the ED, such as changes or worsening in symptoms.   Vital signs are stable at discharge.   BP 145/79  Pulse 79  Temp(Src) 99 F (37.2 C) (Oral)  Resp 16  SpO2 99%  Patient/guardian has voiced understanding and agreed to follow-up with the PCP or  specialist.        Dierdre Forth, PA-C 08/12/13 [phone]

## 2013-08-11 NOTE — Discharge Instructions (Signed)
1. Medications: phenergan, usual home medications 2. Treatment: rest, drink plenty of fluids, do not drink beer, bland foods 3. Follow Up: Please followup with the surgeon on 08/15/13 as directed   Biliary Colic  Biliary colic is a steady or irregular pain in the upper abdomen. It is usually under the right side of the rib cage. It happens when gallstones interfere with the normal flow of bile from the gallbladder. Bile is a liquid that helps to digest fats. Bile is made in the liver and stored in the gallbladder. When you eat a meal, bile passes from the gallbladder through the cystic duct and the common bile duct into the small intestine. There, it mixes with partially digested food. If a gallstone blocks either of these ducts, the normal flow of bile is blocked. The muscle cells in the bile duct contract forcefully to try to move the stone. This causes the pain of biliary colic.  SYMPTOMS   A person with biliary colic usually complains of pain in the upper abdomen. This pain can be:  In the center of the upper abdomen just below the breastbone.  In the upper-right part of the abdomen, near the gallbladder and liver.  Spread back toward the right shoulder blade.  Nausea and vomiting.  The pain usually occurs after eating.  Biliary colic is usually triggered by the digestive system's demand for bile. The demand for bile is high after fatty meals. Symptoms can also occur when a person who has been fasting suddenly eats a very large meal. Most episodes of biliary colic pass after 1 to 5 hours. After the most intense pain passes, your abdomen may continue to ache mildly for about 24 hours. DIAGNOSIS  After you describe your symptoms, your caregiver will perform a physical exam. He or she will pay attention to the upper right portion of your belly (abdomen). This is the area of your liver and gallbladder. An ultrasound will help your caregiver look for gallstones. Specialized scans of the  gallbladder may also be done. Blood tests may be done, especially if you have fever or if your pain persists. PREVENTION  Biliary colic can be prevented by controlling the risk factors for gallstones. Some of these risk factors, such as heredity, increasing age, and pregnancy are a normal part of life. Obesity and a high-fat diet are risk factors you can change through a healthy lifestyle. Women going through menopause who take hormone replacement therapy (estrogen) are also more likely to develop biliary colic. TREATMENT   Pain medication may be prescribed.  You may be encouraged to eat a fat-free diet.  If the first episode of biliary colic is severe, or episodes of colic keep retuning, surgery to remove the gallbladder (cholecystectomy) is usually recommended. This procedure can be done through small incisions using an instrument called a laparoscope. The procedure often requires a brief stay in the hospital. Some people can leave the hospital the same day. It is the most widely used treatment in people troubled by painful gallstones. It is effective and safe, with no complications in more than 90% of cases.  If surgery cannot be done, medication that dissolves gallstones may be used. This medication is expensive and can take months or years to work. Only small stones will dissolve.  Rarely, medication to dissolve gallstones is combined with a procedure called shock-wave lithotripsy. This procedure uses carefully aimed shock waves to break up gallstones. In many people treated with this procedure, gallstones form again within a few  years. PROGNOSIS  If gallstones block your cystic duct or common bile duct, you are at risk for repeated episodes of biliary colic. There is also a 25% chance that you will develop a gallbladder infection(acute cholecystitis), or some other complication of gallstones within 10 to 20 years. If you have surgery, schedule it at a time that is convenient for you and at a  time when you are not sick. HOME CARE INSTRUCTIONS   Drink plenty of clear fluids.  Avoid fatty, greasy or fried foods, or any foods that make your pain worse.  Take medications as directed. SEEK MEDICAL CARE IF:   You develop a fever over 100.5 F (38.1 C).  Your pain gets worse over time.  You develop nausea that prevents you from eating and drinking.  You develop vomiting. SEEK IMMEDIATE MEDICAL CARE IF:   You have continuous or severe belly (abdominal) pain which is not relieved with medications.  You develop nausea and vomiting which is not relieved with medications.  You have symptoms of biliary colic and you suddenly develop a fever and shaking chills. This may signal cholecystitis. Call your caregiver immediately.  You develop a yellow color to your skin or the white part of your eyes (jaundice). Document Released: 08/11/2005 Document Revised: 06/02/2011 Document Reviewed: 10/21/2007 [facility] Patient Information 2014 [place], [place].

## 2013-08-11 NOTE — ED Notes (Signed)
Pt given ginger ale and saltines, tolerating well at this time.

## 2013-08-11 NOTE — ED Notes (Signed)
Bed: ZO10WA04 Expected date:  Expected time:  Means of arrival:  Comments: RUQ pain

## 2013-08-11 NOTE — ED Notes (Signed)
PER EMS - pt homeless, recently dx with gallstones, c/o nausea/vomiting today.  No relief with zofran. A+Ox4.  +ETOH.

## 2013-08-15 NOTE — ED Provider Notes (Signed)
Medical screening examination/treatment/procedure(s) were performed by non-physician practitioner and as supervising physician I was immediately available for consultation/collaboration.   Linwood Dibbles, MD 08/15/13 [phone]

## 2013-08-16 ENCOUNTER — Encounter (HOSPITAL_COMMUNITY): Payer: Self-pay | Admitting: Emergency Medicine

## 2013-08-16 ENCOUNTER — Inpatient Hospital Stay (HOSPITAL_COMMUNITY)
Admission: EM | Admit: 2013-08-16 | Discharge: 2013-08-19 | DRG: 418 | Disposition: A | Discharge status: Home / Self Care | Payer: Self-pay | Attending: General Surgery | Admitting: General Surgery

## 2013-08-16 ENCOUNTER — Ambulatory Visit (INDEPENDENT_AMBULATORY_CARE_PROVIDER_SITE_OTHER): Payer: Self-pay | Admitting: Surgery

## 2013-08-16 DIAGNOSIS — F1010 Alcohol abuse, uncomplicated: Secondary | ICD-10-CM | POA: Diagnosis present

## 2013-08-16 DIAGNOSIS — E876 Hypokalemia: Secondary | ICD-10-CM | POA: Diagnosis present

## 2013-08-16 DIAGNOSIS — Z5900 Homelessness unspecified: Secondary | ICD-10-CM

## 2013-08-16 DIAGNOSIS — F209 Schizophrenia, unspecified: Secondary | ICD-10-CM | POA: Diagnosis present

## 2013-08-16 DIAGNOSIS — K8020 Calculus of gallbladder without cholecystitis without obstruction: Secondary | ICD-10-CM

## 2013-08-16 DIAGNOSIS — F1410 Cocaine abuse, uncomplicated: Secondary | ICD-10-CM | POA: Diagnosis present

## 2013-08-16 DIAGNOSIS — F17200 Nicotine dependence, unspecified, uncomplicated: Secondary | ICD-10-CM | POA: Diagnosis present

## 2013-08-16 DIAGNOSIS — Z720 Tobacco use: Secondary | ICD-10-CM

## 2013-08-16 DIAGNOSIS — F1020 Alcohol dependence, uncomplicated: Secondary | ICD-10-CM | POA: Diagnosis present

## 2013-08-16 DIAGNOSIS — K8050 Calculus of bile duct without cholangitis or cholecystitis without obstruction: Secondary | ICD-10-CM | POA: Diagnosis present

## 2013-08-16 DIAGNOSIS — E440 Moderate protein-calorie malnutrition: Secondary | ICD-10-CM | POA: Insufficient documentation

## 2013-08-16 DIAGNOSIS — F1210 Cannabis abuse, uncomplicated: Secondary | ICD-10-CM | POA: Diagnosis present

## 2013-08-16 DIAGNOSIS — R1011 Right upper quadrant pain: Secondary | ICD-10-CM

## 2013-08-16 DIAGNOSIS — F319 Bipolar disorder, unspecified: Secondary | ICD-10-CM

## 2013-08-16 HISTORY — DX: Calculus of gallbladder without cholecystitis without obstruction: K80.20

## 2013-08-16 HISTORY — DX: Unspecified convulsions: R56.9

## 2013-08-16 HISTORY — DX: Bipolar disorder, unspecified: F31.9

## 2013-08-16 HISTORY — DX: Nicotine dependence, unspecified, uncomplicated: F17.200

## 2013-08-16 HISTORY — DX: Gastro-esophageal reflux disease without esophagitis: K21.9

## 2013-08-16 LAB — CBC WITH DIFFERENTIAL/PLATELET
BASOS ABS: 0 10*3/uL (ref 0.0–0.1)
BASOS PCT: 1 % (ref 0–1)
Eosinophils Absolute: 0.1 10*3/uL (ref 0.0–0.7)
Eosinophils Relative: 2 % (ref 0–5)
HCT: 34.8 % — ABNORMAL LOW (ref 39.0–52.0)
Hemoglobin: 12.3 g/dL — ABNORMAL LOW (ref 13.0–17.0)
Lymphocytes Relative: 25 % (ref 12–46)
Lymphs Abs: 1.4 10*3/uL (ref 0.7–4.0)
MCH: 33.5 pg (ref 26.0–34.0)
MCHC: 35.3 g/dL (ref 30.0–36.0)
MCV: 94.8 fL (ref 78.0–100.0)
Monocytes Absolute: 1 10*3/uL (ref 0.1–1.0)
Monocytes Relative: 19 % — ABNORMAL HIGH (ref 3–12)
NEUTROS ABS: 2.9 10*3/uL (ref 1.7–7.7)
NEUTROS PCT: 53 % (ref 43–77)
PLATELETS: 329 10*3/uL (ref 150–400)
RBC: 3.67 MIL/uL — ABNORMAL LOW (ref 4.22–5.81)
RDW: 12.3 % (ref 11.5–15.5)
WBC: 5.5 10*3/uL (ref 4.0–10.5)

## 2013-08-16 LAB — COMPREHENSIVE METABOLIC PANEL
ALBUMIN: 3.6 g/dL (ref 3.5–5.2)
ALK PHOS: 70 U/L (ref 39–117)
ALT: 21 U/L (ref 0–53)
AST: 32 U/L (ref 0–37)
BILIRUBIN TOTAL: 0.2 mg/dL — AB (ref 0.3–1.2)
BUN: 8 mg/dL (ref 6–23)
CHLORIDE: 89 meq/L — AB (ref 96–112)
CO2: 31 mEq/L (ref 19–32)
Calcium: 9.6 mg/dL (ref 8.4–10.5)
Creatinine, Ser: 0.89 mg/dL (ref 0.50–1.35)
GFR calc Af Amer: >90 mL/min (ref >90)
GFR calc non Af Amer: >90 mL/min (ref >90)
Glucose, Bld: 93 mg/dL (ref 70–99)
POTASSIUM: 2.8 meq/L — AB (ref 3.7–5.3)
SODIUM: 136 meq/L — AB (ref 137–147)
Total Protein: 7.7 g/dL (ref 6.0–8.3)

## 2013-08-16 LAB — LIPASE, BLOOD: Lipase: 64 U/L — ABNORMAL HIGH (ref 11–59)

## 2013-08-16 MED ORDER — FOLIC ACID 1 MG PO TABS
1.0000 mg | ORAL_TABLET | Freq: Every day | ORAL | Status: DC
  Administered 2013-08-16 – 2013-08-19 (×3): 1 mg via ORAL
  Filled 2013-08-16 (×4): qty 1

## 2013-08-16 MED ORDER — LORAZEPAM 2 MG/ML IJ SOLN
0.0000 mg | Freq: Four times a day (QID) | INTRAMUSCULAR | Status: AC
  Administered 2013-08-18: 4 mg via INTRAVENOUS
  Filled 2013-08-16: qty 2

## 2013-08-16 MED ORDER — HYDROMORPHONE HCL PF 1 MG/ML IJ SOLN
1.0000 mg | INTRAMUSCULAR | Status: DC | PRN
  Administered 2013-08-17 – 2013-08-18 (×4): 1 mg via INTRAVENOUS
  Filled 2013-08-16 (×4): qty 1

## 2013-08-16 MED ORDER — KCL-LACTATED RINGERS-D5W 20 MEQ/L IV SOLN
INTRAVENOUS | Status: DC
  Administered 2013-08-16: 23:00:00 via INTRAVENOUS
  Filled 2013-08-16 (×3): qty 1000

## 2013-08-16 MED ORDER — LORAZEPAM 1 MG PO TABS
1.0000 mg | ORAL_TABLET | Freq: Four times a day (QID) | ORAL | Status: DC | PRN
  Filled 2013-08-16: qty 1

## 2013-08-16 MED ORDER — ADULT MULTIVITAMIN W/MINERALS CH
1.0000 | ORAL_TABLET | Freq: Every day | ORAL | Status: DC
  Administered 2013-08-16 – 2013-08-19 (×3): 1 via ORAL
  Filled 2013-08-16 (×4): qty 1

## 2013-08-16 MED ORDER — LORAZEPAM 2 MG/ML IJ SOLN
1.0000 mg | Freq: Four times a day (QID) | INTRAMUSCULAR | Status: DC | PRN
  Administered 2013-08-18: 1 mg via INTRAVENOUS
  Filled 2013-08-16: qty 1

## 2013-08-16 MED ORDER — THIAMINE HCL 100 MG/ML IJ SOLN
100.0000 mg | Freq: Every day | INTRAMUSCULAR | Status: DC
  Filled 2013-08-16 (×3): qty 1

## 2013-08-16 MED ORDER — ENOXAPARIN SODIUM 40 MG/0.4ML ~~LOC~~ SOLN
40.0000 mg | SUBCUTANEOUS | Status: DC
  Administered 2013-08-16 – 2013-08-18 (×3): 40 mg via SUBCUTANEOUS
  Filled 2013-08-16 (×6): qty 0.4

## 2013-08-16 MED ORDER — ONDANSETRON HCL 4 MG/2ML IJ SOLN: 4.0000 mg | Freq: Four times a day (QID) | INTRAMUSCULAR | Status: DC | PRN

## 2013-08-16 MED ORDER — LORAZEPAM 2 MG/ML IJ SOLN: 0.0000 mg | Freq: Two times a day (BID) | INTRAMUSCULAR | Status: DC

## 2013-08-16 MED ORDER — POTASSIUM CHLORIDE 10 MEQ/100ML IV SOLN
10.0000 meq | Freq: Once | INTRAVENOUS | Status: AC
  Administered 2013-08-16: 10 meq via INTRAVENOUS
  Filled 2013-08-16: qty 100

## 2013-08-16 MED ORDER — VITAMIN B-1 100 MG PO TABS
100.0000 mg | ORAL_TABLET | Freq: Every day | ORAL | Status: DC
  Administered 2013-08-16 – 2013-08-19 (×3): 100 mg via ORAL
  Filled 2013-08-16 (×4): qty 1

## 2013-08-16 NOTE — ED Provider Notes (Signed)
CSN: [id number]     Arrival date & time 08/16/13  1607 History   First MD Initiated Contact with Patient 08/16/13 1807     Chief Complaint  Patient presents with  . Cholelithiasis     (Consider location/radiation/quality/duration/timing/severity/associated sxs/prior Treatment) The history is provided by the patient.   patient here complaining of worsening right lower quadrant pain consistent with his known diagnosis of cholelithiasis. He is homeless and doesn't have insurance and went to see the general surgeon today who was able to help the patient due 2 lack of funds. He continues to have pain in the right upper quadrant has been colicky and worse after use. He denies any fever has had vomiting. No black or bloody stools. Symptoms are persistent and nothing makes them better  Past Medical History  Diagnosis Date  . Alcohol abuse   . Schizophrenia   . Bipolar 1 disorder   . Mental disorder   . Depression    Past Surgical History  Procedure Laterality Date  . Skin graft     No family history on file. History  Substance Use Topics  . Smoking status: Current Every Day Smoker -- 2.00 packs/day for 30 years    Types: Cigarettes  . Smokeless tobacco: Not on file  . Alcohol Use: 12.6 oz/week    21 Cans of beer per week     Comment: drinks as much etoh as he can get per day    Review of Systems  All other systems reviewed and are negative.     Allergies  Review of patient's allergies indicates no known allergies.  Home Medications   Prior to Admission medications   Medication Sig Start Date End Date Taking? Authorizing Provider  ondansetron (ZOFRAN ODT) 8 MG disintegrating tablet Take 1 tablet (8 mg total) by mouth every 8 (eight) hours as needed for nausea or vomiting. 08/10/13   Olivia Mackie, MD  oxyCODONE (OXY IR/ROXICODONE) 5 MG immediate release tablet Take 1 tablet (5 mg total) by mouth every 4 (four) hours as needed for severe pain. Take 1-2 tab po q 4-6 hours 08/10/13    Olivia Mackie, MD  oxyCODONE-acetaminophen (PERCOCET/ROXICET) 5-325 MG per tablet Take 1 tablet by mouth every 4 (four) hours as needed for severe pain. 08/04/13   Lyanne Co, MD  promethazine (PHENERGAN) 25 MG tablet Take 1 tablet (25 mg total) by mouth every 6 (six) hours as needed for nausea or vomiting. 08/11/13   Dahlia Client Muthersbaugh, PA-C   BP 119/73  Pulse 85  Temp(Src) 98.9 F (37.2 C) (Oral)  Resp 15  Ht 5\' 8"  (1.727 m)  Wt 150 lb (68.04 kg)  BMI 22.81 kg/m2  SpO2 100% Physical Exam  Nursing note and vitals reviewed. Constitutional: He is oriented to person, place, and time. He appears well-developed and well-nourished.  Non-toxic appearance. No distress.  HENT:  Head: Normocephalic and atraumatic.  Eyes: Conjunctivae, EOM and lids are normal. Pupils are equal, round, and reactive to light.  Neck: Normal range of motion. Neck supple. No tracheal deviation present. No mass present.  Cardiovascular: Normal rate, regular rhythm and normal heart sounds.  Exam reveals no gallop.   No murmur heard. Pulmonary/Chest: Effort normal and breath sounds normal. No stridor. No respiratory distress. He has no decreased breath sounds. He has no wheezes. He has no rhonchi. He has no rales.  Abdominal: Soft. Normal appearance and bowel sounds are normal. He exhibits no distension. There is tenderness in the right upper  quadrant. There is no rigidity, no rebound, no guarding and no CVA tenderness.    Musculoskeletal: Normal range of motion. He exhibits no edema and no tenderness.  Neurological: He is alert and oriented to person, place, and time. He has normal strength. No cranial nerve deficit or sensory deficit. GCS eye subscore is 4. GCS verbal subscore is 5. GCS motor subscore is 6.  Skin: Skin is warm and dry. No abrasion and no rash noted.  Psychiatric: He has a normal mood and affect. His speech is normal and behavior is normal.    ED Course  Procedures (including critical care  time) Labs Review Labs Reviewed  CBC WITH DIFFERENTIAL - Abnormal; Notable for the following:    RBC 3.67 (*)    Hemoglobin 12.3 (*)    HCT 34.8 (*)    Monocytes Relative 19 (*)    All other components within normal limits  COMPREHENSIVE METABOLIC PANEL - Abnormal; Notable for the following:    Sodium 136 (*)    Potassium 2.8 (*)    Chloride 89 (*)    Total Bilirubin 0.2 (*)    All other components within normal limits  LIPASE, BLOOD - Abnormal; Notable for the following:    Lipase 64 (*)    All other components within normal limits    Imaging Review No results found.   EKG Interpretation None      MDM   Final diagnoses:  None    Patient to be seen by neurosurgery and admitted for a cholecystectomy    Toy BakerAnthony T Lateka Rady, MD 08/16/13 1827

## 2013-08-16 NOTE — ED Notes (Signed)
Attempted report 

## 2013-08-16 NOTE — H&P (Addendum)
Jimmy [name] is an 58 y.o. male.   Chief Complaint: RUQ abdominal pain HPI: pt presents with RUQ pain and hx of gallstones. Has been to ED 3 times in 6 days with RUQ pain.  U/S shows gallstones. He is homeless and drinks 12 beers a day or more.   Past Medical History  Diagnosis Date  . Alcohol abuse   . Schizophrenia   . Bipolar 1 disorder   . Mental disorder   . Depression     Past Surgical History  Procedure Laterality Date  . Skin graft      No family history on file. Social History:  reports that he has been smoking Cigarettes.  He has a 60 pack-year smoking history. He does not have any smokeless tobacco history on file. He reports that he drinks about 12.6 ounces of alcohol per week. He reports that he uses illicit drugs (Cocaine and Marijuana).  Allergies: No Known Allergies   (Not in a hospital admission)  Results for orders placed during the hospital encounter of 08/16/13 (from the past 48 hour(s))  CBC WITH DIFFERENTIAL     Status: Abnormal   Collection Time    08/16/13  4:25 PM      Result Value Ref Range   WBC 5.5  4.0 - 10.5 K/uL   RBC 3.67 (*) 4.22 - 5.81 MIL/uL   Hemoglobin 12.3 (*) 13.0 - 17.0 g/dL   HCT 34.8 (*) 39.0 - 52.0 %   MCV 94.8  78.0 - 100.0 fL   MCH 33.5  26.0 - 34.0 pg   MCHC 35.3  30.0 - 36.0 g/dL   RDW 12.3  11.5 - 15.5 %   Platelets 329  150 - 400 K/uL   Neutrophils Relative % 53  43 - 77 %   Neutro Abs 2.9  1.7 - 7.7 K/uL   Lymphocytes Relative 25  12 - 46 %   Lymphs Abs 1.4  0.7 - 4.0 K/uL   Monocytes Relative 19 (*) 3 - 12 %   Monocytes Absolute 1.0  0.1 - 1.0 K/uL   Eosinophils Relative 2  0 - 5 %   Eosinophils Absolute 0.1  0.0 - 0.7 K/uL   Basophils Relative 1  0 - 1 %   Basophils Absolute 0.0  0.0 - 0.1 K/uL  COMPREHENSIVE METABOLIC PANEL     Status: Abnormal   Collection Time    08/16/13  4:25 PM      Result Value Ref Range   Sodium 136 (*) 137 - 147 mEq/L   Potassium 2.8 (*) 3.7 - 5.3 mEq/L   Comment: CRITICAL RESULT  CALLED TO, READ BACK BY AND VERIFIED WITH:     BROWNING M,RN 08/16/13 1717 WAYK   Chloride 89 (*) 96 - 112 mEq/L   CO2 31  19 - 32 mEq/L   Glucose, Bld 93  70 - 99 mg/dL   BUN 8  6 - 23 mg/dL   Creatinine, Ser 0.89  0.50 - 1.35 mg/dL   Calcium 9.6  8.4 - 10.5 mg/dL   Total Protein 7.7  6.0 - 8.3 g/dL   Albumin 3.6  3.5 - 5.2 g/dL   AST 32  0 - 37 U/L   ALT 21  0 - 53 U/L   Alkaline Phosphatase 70  39 - 117 U/L   Total Bilirubin 0.2 (*) 0.3 - 1.2 mg/dL   GFR calc non Af Amer >90  >90 mL/min   GFR calc Af Amer >90  >  90 mL/min   Comment: (NOTE)     The eGFR has been calculated using the CKD EPI equation.     This calculation has not been validated in all clinical situations.     eGFR's persistently <90 mL/min signify possible Chronic Kidney     Disease.  LIPASE, BLOOD     Status: Abnormal   Collection Time    08/16/13  4:25 PM      Result Value Ref Range   Lipase 64 (*) 11 - 59 U/L   No results found.  Review of Systems  Constitutional: Positive for malaise/fatigue.  Respiratory: Negative.   Cardiovascular: Negative.   Gastrointestinal: Positive for abdominal pain.  Genitourinary: Negative.   Musculoskeletal: Negative.   Neurological: Negative.   Endo/Heme/Allergies: Negative.   Psychiatric/Behavioral: Negative.     Blood pressure 136/82, pulse 78, temperature 98.9 F (37.2 C), temperature source Oral, resp. rate 24, height _0  (1.727 m), weight 150 lb (68.04 kg), SpO2 99.00%. Physical Exam  HENT:  Head: Normocephalic and atraumatic.  Eyes: EOM are normal. Pupils are equal, round, and reactive to light. No scleral icterus.  Neck: Normal range of motion.  Cardiovascular: Normal rate.   Respiratory: Effort normal and breath sounds normal.  GI: There is tenderness in the right upper quadrant.  Skin: Skin is warm.  Psychiatric: He has a normal mood and affect. His behavior is normal. Judgment and thought content normal.     Assessment/Plan Cholelithiasis  symptomatic ETOH  abuse Homeless marajuana  Use  Pt has had multiple visits to ED  For gallstone disease and pain today. Admit Needs LAP [facility] [place] Social work consult DT prevention Hypokalemia   Replace K Jiayi Lengacher A. Aziah Brostrom 08/16/2013, 6:50 PM

## 2013-08-16 NOTE — ED Notes (Signed)
Hooked pt up to monitor. Pt being monitored by pulse ox and blood pressure.

## 2013-08-16 NOTE — ED Notes (Signed)
Pt reports was told he had gallstones 3 weeks ago at [facility]. Pt made apt with specialist and went to apt today. Pt unable to pay for service due to being homeless so they sent him back here to the ER. Pt reports difficulty keeping food and drink down.

## 2013-08-17 DIAGNOSIS — E440 Moderate protein-calorie malnutrition: Secondary | ICD-10-CM | POA: Insufficient documentation

## 2013-08-17 LAB — BASIC METABOLIC PANEL
BUN: 6 mg/dL (ref 6–23)
CO2: 29 mEq/L (ref 19–32)
CREATININE: 0.71 mg/dL (ref 0.50–1.35)
Calcium: 9.2 mg/dL (ref 8.4–10.5)
Chloride: 97 mEq/L (ref 96–112)
GFR calc Af Amer: >90 mL/min (ref >90)
Glucose, Bld: 117 mg/dL — ABNORMAL HIGH (ref 70–99)
Potassium: 3.8 mEq/L (ref 3.7–5.3)
SODIUM: 137 meq/L (ref 137–147)

## 2013-08-17 LAB — COMPREHENSIVE METABOLIC PANEL
ALBUMIN: 3.1 g/dL — AB (ref 3.5–5.2)
ALK PHOS: 63 U/L (ref 39–117)
ALT: 17 U/L (ref 0–53)
AST: 28 U/L (ref 0–37)
BUN: 7 mg/dL (ref 6–23)
CO2: 27 mEq/L (ref 19–32)
CREATININE: 0.67 mg/dL (ref 0.50–1.35)
Calcium: 9 mg/dL (ref 8.4–10.5)
Chloride: 99 mEq/L (ref 96–112)
GFR calc non Af Amer: >90 mL/min (ref >90)
GLUCOSE: 96 mg/dL (ref 70–99)
Potassium: 3.6 mEq/L — ABNORMAL LOW (ref 3.7–5.3)
Sodium: 137 mEq/L (ref 137–147)
TOTAL PROTEIN: 6.8 g/dL (ref 6.0–8.3)
Total Bilirubin: 0.4 mg/dL (ref 0.3–1.2)

## 2013-08-17 LAB — SURGICAL PCR SCREEN
MRSA, PCR: NEGATIVE
Staphylococcus aureus: NEGATIVE

## 2013-08-17 LAB — CBC
HEMATOCRIT: 37 % — AB (ref 39.0–52.0)
Hemoglobin: 12.8 g/dL — ABNORMAL LOW (ref 13.0–17.0)
MCH: 33.5 pg (ref 26.0–34.0)
MCHC: 34.6 g/dL (ref 30.0–36.0)
MCV: 96.9 fL (ref 78.0–100.0)
PLATELETS: 309 10*3/uL (ref 150–400)
RBC: 3.82 MIL/uL — ABNORMAL LOW (ref 4.22–5.81)
RDW: 12.3 % (ref 11.5–15.5)
WBC: 4.3 10*3/uL (ref 4.0–10.5)

## 2013-08-17 LAB — MAGNESIUM: MAGNESIUM: 2.1 mg/dL (ref 1.5–2.5)

## 2013-08-17 MED ORDER — NICOTINE 21 MG/24HR TD PT24
21.0000 mg | MEDICATED_PATCH | Freq: Every day | TRANSDERMAL | Status: DC
  Administered 2013-08-17: 21 mg via TRANSDERMAL
  Filled 2013-08-17 (×3): qty 1

## 2013-08-17 MED ORDER — CIPROFLOXACIN IN D5W 400 MG/200ML IV SOLN
400.0000 mg | INTRAVENOUS | Status: AC
  Administered 2013-08-18: 400 mg via INTRAVENOUS
  Filled 2013-08-17: qty 200

## 2013-08-17 MED ORDER — BOOST / RESOURCE BREEZE PO LIQD
1.0000 | Freq: Two times a day (BID) | ORAL | Status: DC
  Administered 2013-08-17 – 2013-08-19 (×3): 1 via ORAL

## 2013-08-17 MED ORDER — POTASSIUM CHLORIDE CRYS ER 20 MEQ PO TBCR
20.0000 meq | EXTENDED_RELEASE_TABLET | Freq: Three times a day (TID) | ORAL | Status: DC
  Administered 2013-08-17 (×2): 20 meq via ORAL
  Filled 2013-08-17 (×2): qty 1

## 2013-08-17 MED ORDER — POTASSIUM CHLORIDE IN NACL 40-0.9 MEQ/L-% IV SOLN
INTRAVENOUS | Status: DC
  Administered 2013-08-17 – 2013-08-18 (×3): via INTRAVENOUS
  Filled 2013-08-17 (×5): qty 1000
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
## 2013-08-17 NOTE — Progress Notes (Signed)
For surgery tomorrow.  Nicotine patch ordered.  Jimmy [name]. Gae Bon, MD, FACS [phone] [facility]

## 2013-08-17 NOTE — Progress Notes (Signed)
  Subjective: Not hurting now.  Seem fairly comfortable,  He drinks 4 40's per day, and smokes a 1PPD, family got him a tent to live in currently.  Objective: Vital signs in last 24 hours: Temp:  [98.3 F (36.8 C)-98.9 F (37.2 C)] 98.3 F (36.8 C) (05/27 0549) Pulse Rate:  [69-85] 74 (05/27 0549) Resp:  [15-24] 18 (05/27 0549) BP: (119-137)/(72-82) 124/72 mmHg (05/27 0549) SpO2:  [98 %-100 %] 100 % (05/27 0549) Weight:  [67.903 kg (149 lb 11.2 oz)-68.04 kg (150 lb)] 67.903 kg (149 lb 11.2 oz) (05/26 2032) Last BM Date: 08/16/13 500 urine output so far recorded Afebrile, VSS K+ 2.8 LFT's normal CBC OK Intake/Output from previous day: 05/26 0701 - 05/27 0700 In: -  Out: 500 [Urine:500] Intake/Output this shift:    General appearance: alert, cooperative and no distress Resp: clear to auscultation bilaterally Cardio: regular rate and rhythm, S1, S2 normal, no murmur, click, rub or gallop GI: soft, non-tender; bowel sounds normal; no masses,  no organomegaly Extremities: no edema.  Lab Results:   Recent Labs  08/16/13 1625 08/17/13 0704  WBC 5.5 4.3  HGB 12.3* 12.8*  HCT 34.8* 37.0*  PLT 329 309    BMET  Recent Labs  08/16/13 1625  NA 136*  K 2.8*  CL 89*  CO2 31  GLUCOSE 93  BUN 8  CREATININE 0.89  CALCIUM 9.6   PT/INR No results found for this basename: LABPROT, INR,  in the last 72 hours   Recent Labs Lab 08/11/13 1900 08/16/13 1625  AST 35 32  ALT 20 21  ALKPHOS 83 70  BILITOT 0.3 0.2*  PROT 7.7 7.7  ALBUMIN 3.4* 3.6     Lipase     Component Value Date/Time   LIPASE 64* 08/16/2013 1625     Studies/Results: No results found.  Medications: . enoxaparin (LOVENOX) injection  40 mg Subcutaneous Q24H  . folic acid  1 mg Oral Daily  . LORazepam  0-4 mg Intravenous Q6H   Followed by  . [START ON 08/18/2013] LORazepam  0-4 mg Intravenous Q12H  . multivitamin with minerals  1 tablet Oral Daily  . thiamine  100 mg Oral Daily   Or  .  thiamine  100 mg Intravenous Daily    Assessment/Plan Symptomatic cholelithiasis with ongoing nausea and vomiting Alcohol and Drug (marajuana use) Homeless schizophrenia Bipolar disorder Depression   Plan:  Replace K+,  Clears for now, continue SIWA protocol, recheck labs and plan surgery for tomorrow.  Recheck labs in Am.  If he does well with clears I will try to up his diet later today.   LOS: 1 day    Sherrie George 08/17/2013

## 2013-08-17 NOTE — Progress Notes (Signed)
Report called to [facility] RN on skilled nursing side. Packet is complete and with patient. Last medications given reported to RN

## 2013-08-17 NOTE — Progress Notes (Signed)
UR completed. Patient changed to inpatient- requiring IV antibiotics and IVF 

## 2013-08-18 ENCOUNTER — Inpatient Hospital Stay (HOSPITAL_COMMUNITY): Payer: Self-pay

## 2013-08-18 ENCOUNTER — Encounter (HOSPITAL_COMMUNITY): Payer: Self-pay | Admitting: Certified Registered"

## 2013-08-18 ENCOUNTER — Encounter (HOSPITAL_COMMUNITY): Payer: MEDICAID | Admitting: Anesthesiology

## 2013-08-18 ENCOUNTER — Inpatient Hospital Stay (HOSPITAL_COMMUNITY): Payer: Self-pay | Admitting: Anesthesiology

## 2013-08-18 ENCOUNTER — Encounter (HOSPITAL_COMMUNITY): Admission: EM | Disposition: A | Discharge status: Home / Self Care | Payer: Self-pay | Source: Home / Self Care

## 2013-08-18 DIAGNOSIS — K811 Chronic cholecystitis: Secondary | ICD-10-CM

## 2013-08-18 HISTORY — PX: CHOLECYSTECTOMY: SHX55

## 2013-08-18 LAB — COMPREHENSIVE METABOLIC PANEL
ALK PHOS: 56 U/L (ref 39–117)
ALT: 16 U/L (ref 0–53)
AST: 25 U/L (ref 0–37)
Albumin: 2.8 g/dL — ABNORMAL LOW (ref 3.5–5.2)
BUN: 5 mg/dL — AB (ref 6–23)
CALCIUM: 8.3 mg/dL — AB (ref 8.4–10.5)
CO2: 22 mEq/L (ref 19–32)
Chloride: 104 mEq/L (ref 96–112)
Creatinine, Ser: 0.63 mg/dL (ref 0.50–1.35)
GFR calc Af Amer: >90 mL/min (ref >90)
Glucose, Bld: 98 mg/dL (ref 70–99)
Potassium: 4.9 mEq/L (ref 3.7–5.3)
SODIUM: 135 meq/L — AB (ref 137–147)
TOTAL PROTEIN: 6.2 g/dL (ref 6.0–8.3)
Total Bilirubin: <0.2 mg/dL — ABNORMAL LOW (ref 0.3–1.2)

## 2013-08-18 LAB — MAGNESIUM: Magnesium: 2.2 mg/dL (ref 1.5–2.5)

## 2013-08-18 SURGERY — LAPAROSCOPIC CHOLECYSTECTOMY WITH INTRAOPERATIVE CHOLANGIOGRAM
Anesthesia: General | Site: Abdomen

## 2013-08-18 MED ORDER — LIDOCAINE HCL (CARDIAC) 20 MG/ML IV SOLN
INTRAVENOUS | Status: AC
  Filled 2013-08-18: qty 5

## 2013-08-18 MED ORDER — FENTANYL CITRATE 0.05 MG/ML IJ SOLN
INTRAMUSCULAR | Status: AC
  Filled 2013-08-18: qty 5

## 2013-08-18 MED ORDER — ONDANSETRON HCL 4 MG/2ML IJ SOLN
INTRAMUSCULAR | Status: DC | PRN
  Administered 2013-08-18: 4 mg via INTRAVENOUS

## 2013-08-18 MED ORDER — HYDROMORPHONE HCL PF 1 MG/ML IJ SOLN: 1.0000 mg | INTRAMUSCULAR | Status: DC | PRN

## 2013-08-18 MED ORDER — GLYCOPYRROLATE 0.2 MG/ML IJ SOLN
INTRAMUSCULAR | Status: DC | PRN
  Administered 2013-08-18: 0.4 mg via INTRAVENOUS

## 2013-08-18 MED ORDER — MIDAZOLAM HCL 5 MG/5ML IJ SOLN
INTRAMUSCULAR | Status: DC | PRN
  Administered 2013-08-18: 2 mg via INTRAVENOUS

## 2013-08-18 MED ORDER — ROCURONIUM BROMIDE 50 MG/5ML IV SOLN
INTRAVENOUS | Status: AC
  Filled 2013-08-18: qty 1

## 2013-08-18 MED ORDER — ROCURONIUM BROMIDE 100 MG/10ML IV SOLN
INTRAVENOUS | Status: DC | PRN
  Administered 2013-08-18 (×2): 10 mg via INTRAVENOUS
  Administered 2013-08-18: 40 mg via INTRAVENOUS
  Administered 2013-08-18: 10 mg via INTRAVENOUS

## 2013-08-18 MED ORDER — OXYCODONE HCL 5 MG PO TABS
ORAL_TABLET | ORAL | Status: AC
  Filled 2013-08-18: qty 1

## 2013-08-18 MED ORDER — NEOSTIGMINE METHYLSULFATE 10 MG/10ML IV SOLN
INTRAVENOUS | Status: DC | PRN
  Administered 2013-08-18: 3 mg via INTRAVENOUS

## 2013-08-18 MED ORDER — 0.9 % SODIUM CHLORIDE (POUR BTL) OPTIME
TOPICAL | Status: DC | PRN
  Administered 2013-08-18: 1000 mL

## 2013-08-18 MED ORDER — OXYCODONE HCL 5 MG/5ML PO SOLN: 5.0000 mg | Freq: Once | ORAL | Status: AC | PRN

## 2013-08-18 MED ORDER — NEOSTIGMINE METHYLSULFATE 10 MG/10ML IV SOLN
INTRAVENOUS | Status: AC
  Filled 2013-08-18: qty 1

## 2013-08-18 MED ORDER — PROPOFOL 10 MG/ML IV BOLUS
INTRAVENOUS | Status: AC
  Filled 2013-08-18: qty 20

## 2013-08-18 MED ORDER — ONDANSETRON HCL 4 MG/2ML IJ SOLN
INTRAMUSCULAR | Status: AC
  Filled 2013-08-18: qty 2

## 2013-08-18 MED ORDER — BUPIVACAINE-EPINEPHRINE (PF) 0.25% -1:200000 IJ SOLN
INTRAMUSCULAR | Status: AC
  Filled 2013-08-18: qty 30

## 2013-08-18 MED ORDER — POTASSIUM CHLORIDE IN NACL 40-0.9 MEQ/L-% IV SOLN
INTRAVENOUS | Status: DC
  Administered 2013-08-18: 125 mL/h via INTRAVENOUS
  Filled 2013-08-18 (×4): qty 1000

## 2013-08-18 MED ORDER — PHENYLEPHRINE HCL 10 MG/ML IJ SOLN
INTRAMUSCULAR | Status: DC | PRN
  Administered 2013-08-18 (×3): 40 ug via INTRAVENOUS

## 2013-08-18 MED ORDER — MIDAZOLAM HCL 2 MG/2ML IJ SOLN
INTRAMUSCULAR | Status: AC
Start: 2013-08-18 — End: 2013-08-18
  Filled 2013-08-18: qty 2

## 2013-08-18 MED ORDER — GLYCOPYRROLATE 0.2 MG/ML IJ SOLN
INTRAMUSCULAR | Status: AC
  Filled 2013-08-18: qty 2

## 2013-08-18 MED ORDER — ONDANSETRON HCL 4 MG/2ML IJ SOLN: 4.0000 mg | Freq: Once | INTRAMUSCULAR | Status: DC | PRN

## 2013-08-18 MED ORDER — SODIUM CHLORIDE 0.9 % IV SOLN
INTRAVENOUS | Status: DC | PRN
  Administered 2013-08-18: 10:00:00

## 2013-08-18 MED ORDER — KETOROLAC TROMETHAMINE 30 MG/ML IJ SOLN
30.0000 mg | Freq: Once | INTRAMUSCULAR | Status: AC
  Administered 2013-08-18: 30 mg via INTRAVENOUS

## 2013-08-18 MED ORDER — PROPOFOL 10 MG/ML IV BOLUS
INTRAVENOUS | Status: DC | PRN
  Administered 2013-08-18: 170 mg via INTRAVENOUS

## 2013-08-18 MED ORDER — KETOROLAC TROMETHAMINE 30 MG/ML IJ SOLN
INTRAMUSCULAR | Status: AC
  Filled 2013-08-18: qty 1

## 2013-08-18 MED ORDER — LACTATED RINGERS IV SOLN
INTRAVENOUS | Status: DC
  Administered 2013-08-18: 09:00:00 via INTRAVENOUS

## 2013-08-18 MED ORDER — HYDROMORPHONE HCL PF 1 MG/ML IJ SOLN
0.2500 mg | INTRAMUSCULAR | Status: DC | PRN
  Administered 2013-08-18 (×2): 0.5 mg via INTRAVENOUS

## 2013-08-18 MED ORDER — OXYCODONE HCL 5 MG PO TABS
5.0000 mg | ORAL_TABLET | Freq: Once | ORAL | Status: AC | PRN
  Administered 2013-08-18: 5 mg via ORAL

## 2013-08-18 MED ORDER — LIDOCAINE HCL (CARDIAC) 20 MG/ML IV SOLN
INTRAVENOUS | Status: DC | PRN
  Administered 2013-08-18: 40 mg via INTRAVENOUS

## 2013-08-18 MED ORDER — BUPIVACAINE-EPINEPHRINE 0.25% -1:200000 IJ SOLN
INTRAMUSCULAR | Status: DC | PRN
  Administered 2013-08-18: 18 mL

## 2013-08-18 MED ORDER — HYDROMORPHONE HCL PF 1 MG/ML IJ SOLN
INTRAMUSCULAR | Status: AC
  Filled 2013-08-18: qty 1

## 2013-08-18 MED ORDER — FENTANYL CITRATE 0.05 MG/ML IJ SOLN
INTRAMUSCULAR | Status: DC | PRN
  Administered 2013-08-18 (×3): 50 ug via INTRAVENOUS
  Administered 2013-08-18: 100 ug via INTRAVENOUS

## 2013-08-18 MED ORDER — SODIUM CHLORIDE 0.9 % IR SOLN
Status: DC | PRN
  Administered 2013-08-18: 1000 mL

## 2013-08-18 MED ORDER — LORAZEPAM 1 MG PO TABS
0.0000 mg | ORAL_TABLET | Freq: Two times a day (BID) | ORAL | Status: DC
  Administered 2013-08-18: 4 mg via ORAL
  Administered 2013-08-19: 1 mg via ORAL
  Filled 2013-08-18: qty 4

## 2013-08-18 MED ORDER — HYDROCODONE-ACETAMINOPHEN 5-325 MG PO TABS
1.0000 | ORAL_TABLET | ORAL | Status: DC | PRN
  Administered 2013-08-18: 1 via ORAL
  Administered 2013-08-18: 2 via ORAL
  Filled 2013-08-18 (×3): qty 2

## 2013-08-18 SURGICAL SUPPLY — 52 items
ADH SKN CLS APL DERMABOND .7 (GAUZE/BANDAGES/DRESSINGS) ×1
APPLIER CLIP 5 13 M/L LIGAMAX5 (MISCELLANEOUS)
APPLIER CLIP ROT 10 11.4 M/L (STAPLE)
APR CLP MED LRG 11.4X10 (STAPLE)
APR CLP MED LRG 5 ANG JAW (MISCELLANEOUS)
BAG SPEC RTRVL LRG 6X4 10 (ENDOMECHANICALS)
BLADE SURG ROTATE 9660 (MISCELLANEOUS) ×1 IMPLANT
CANISTER SUCTION 2500CC (MISCELLANEOUS) ×2 IMPLANT
CHLORAPREP W/TINT 26ML (MISCELLANEOUS) ×2 IMPLANT
CLIP APPLIE 5 13 M/L LIGAMAX5 (MISCELLANEOUS) IMPLANT
CLIP APPLIE ROT 10 11.4 M/L (STAPLE) IMPLANT
COVER MAYO STAND STRL (DRAPES) ×2 IMPLANT
COVER SURGICAL LIGHT HANDLE (MISCELLANEOUS) ×2 IMPLANT
DERMABOND ADVANCED (GAUZE/BANDAGES/DRESSINGS) ×1
DERMABOND ADVANCED .7 DNX12 (GAUZE/BANDAGES/DRESSINGS) ×1 IMPLANT
DRAPE C-ARM 42X72 X-RAY (DRAPES) ×2 IMPLANT
DRAPE UTILITY 15X26 W/TAPE STR (DRAPE) ×4 IMPLANT
DRSG TEGADERM 2-3/8X2-3/4 SM (GAUZE/BANDAGES/DRESSINGS) ×8 IMPLANT
ELECT REM PT RETURN 9FT ADLT (ELECTROSURGICAL) ×2
ELECTRODE REM PT RTRN 9FT ADLT (ELECTROSURGICAL) ×1 IMPLANT
GLOVE BIO SURGEON STRL SZ7.5 (GLOVE) ×1 IMPLANT
GLOVE BIOGEL PI IND STRL 6.5 (GLOVE) IMPLANT
GLOVE BIOGEL PI IND STRL 7.0 (GLOVE) IMPLANT
GLOVE BIOGEL PI IND STRL 7.5 (GLOVE) ×1 IMPLANT
GLOVE BIOGEL PI IND STRL 8 (GLOVE) ×1 IMPLANT
GLOVE BIOGEL PI INDICATOR 6.5 (GLOVE) ×1
GLOVE BIOGEL PI INDICATOR 7.0 (GLOVE) ×4
GLOVE BIOGEL PI INDICATOR 7.5 (GLOVE) ×1
GLOVE BIOGEL PI INDICATOR 8 (GLOVE) ×1
GLOVE ECLIPSE 7.5 STRL STRAW (GLOVE) ×2 IMPLANT
GLOVE SS BIOGEL STRL SZ 6.5 (GLOVE) IMPLANT
GLOVE SUPERSENSE BIOGEL SZ 6.5 (GLOVE) ×1
GLOVE SURG SS PI 6.5 STRL IVOR (GLOVE) ×1 IMPLANT
GOWN STRL REUS W/ TWL LRG LVL3 (GOWN DISPOSABLE) ×3 IMPLANT
GOWN STRL REUS W/TWL LRG LVL3 (GOWN DISPOSABLE) ×6
KIT BASIN OR (CUSTOM PROCEDURE TRAY) ×2 IMPLANT
KIT ROOM TURNOVER OR (KITS) ×2 IMPLANT
NS IRRIG 1000ML POUR BTL (IV SOLUTION) ×2 IMPLANT
PAD ARMBOARD 7.5X6 YLW CONV (MISCELLANEOUS) ×2 IMPLANT
POUCH SPECIMEN RETRIEVAL 10MM (ENDOMECHANICALS) IMPLANT
SCISSORS LAP 5X35 DISP (ENDOMECHANICALS) ×2 IMPLANT
SET CHOLANGIOGRAPH 5 50 .035 (SET/KITS/TRAYS/PACK) ×2 IMPLANT
SET IRRIG TUBING LAPAROSCOPIC (IRRIGATION / IRRIGATOR) ×2 IMPLANT
SLEEVE ENDOPATH XCEL 5M (ENDOMECHANICALS) ×2 IMPLANT
SPECIMEN JAR SMALL (MISCELLANEOUS) ×2 IMPLANT
SUT MNCRL AB 4-0 PS2 18 (SUTURE) ×2 IMPLANT
TOWEL OR 17X24 6PK STRL BLUE (TOWEL DISPOSABLE) ×2 IMPLANT
TOWEL OR 17X26 10 PK STRL BLUE (TOWEL DISPOSABLE) ×2 IMPLANT
TRAY LAPAROSCOPIC (CUSTOM PROCEDURE TRAY) ×2 IMPLANT
TROCAR XCEL BLUNT TIP 100MML (ENDOMECHANICALS) ×2 IMPLANT
TROCAR XCEL NON-BLD 11X100MML (ENDOMECHANICALS) ×1 IMPLANT
TROCAR XCEL NON-BLD 5MMX100MML (ENDOMECHANICALS) ×2 IMPLANT

## 2013-08-18 NOTE — Anesthesia Postprocedure Evaluation (Signed)
  Anesthesia Post-op Note  Patient: Jimmy [name]  Procedure(s) Performed: Procedure(s): LAPAROSCOPIC CHOLECYSTECTOMY WITH INTRAOPERATIVE CHOLANGIOGRAM (N/A)  Patient Location: PACU  Anesthesia Type:General  Level of Consciousness: awake, alert  and oriented  Airway and Oxygen Therapy: Patient Spontanous Breathing and Patient connected to nasal cannula oxygen  Post-op Pain: mild  Post-op Assessment: Post-op Vital signs reviewed, Patient's Cardiovascular Status Stable, Respiratory Function Stable, Patent Airway and Pain level controlled  Post-op Vital Signs: stable  Last Vitals:  Filed Vitals:   08/18/13 1157  BP: 126/73  Pulse: 58  Temp: 36.3 C  Resp: 16    Complications: No apparent anesthesia complications

## 2013-08-18 NOTE — Op Note (Signed)
OPERATIVE REPORT  DATE OF OPERATION: 08/16/2013 - 08/18/2013  PATIENT:  Jimmy [name]  58 y.o. male  PRE-OPERATIVE DIAGNOSIS:  SYMPTOMATIC CHOLELITHIASIS  POST-OPERATIVE DIAGNOSIS:  SYMPTOMATIC CHOLELITHIASIS  PROCEDURE:  Procedure(s): LAPAROSCOPIC CHOLECYSTECTOMY WITH INTRAOPERATIVE CHOLANGIOGRAM  SURGEON:  Surgeon(s): Cherylynn Ridges, MD  ASSISTANT: None  ANESTHESIA:   general  EBL: <20 ml  BLOOD ADMINISTERED: none  DRAINS: none   SPECIMEN:  Source of Specimen:  Gallbladder with stones  COUNTS CORRECT:  YES  PROCEDURE DETAILS: The patient was taken to the operating room and placed on the table in the supine position.  After an adequate endotracheal anesthetic was administered, the patient was prepped with ChloroPrep, and then draped in the usual manner exposing the entire abdomen laterally, inferiorly and up  to the costal margins.  After a proper timeout was performed including identifying the patient and the procedure to be performed, a infraumbilical 1.5cm midline incision was made using a #15 blade.  This was taken down to the fascia which was then incised with a #15 blade.  The edges of the fascia were tented up with Kocher clamps as the preperitoneal space was penetrated with a Kelly clamp into the peritoneum.  Once this was done, a pursestring suture of 0 Vicryl was passed around the fascial opening.  This was subsequently used to secure the [facility] cannula which was passed into the peritoneal cavity.  Once the [facility] cannula was in place, carbon dioxide gas was insufflated into the peritoneal cavity up to a maximal intra-abdominal pressure of 63mm Hg.The laparoscope, with attached camera and light source, was passed into the peritoneal cavity to visualize the direct insertion of two right upper quadrant 5mm cannulas, and a sup-xiphoid 22mm cannula.  Once all cannulas were in place, the dissection was begun.  Two ratcheted graspers were attached to the dome and infundibulum  of the gallbladder and retracted towards the anterior abdominal wall and the right upper quadrant.  Using cautery attached to a dissecting forceps, the peritoneum overlaying the triangle of Chalot and the hepatoduodenal triangle was dissected away exposing the cystic duct and the cystic artery.  A clip was placed on the gallbladder side of the cystic duct, then a cholecytodochotomy made using the laparoscopic scissors.  Through the cholecystodochotomy a Cook catheter was passed to performed a cholangiogram.  The cholangiogram showed good flow Into the duodenum. goodproximal filling, no intraductal filling defects.  Once the cholangiogram was completed, the [facility] catheter was removed, and the distal cystic duct was clipped multiple times then transected.  The gallbladder was then dissected out of the hepatic bed without event.  It was retrieved from the abdomen (using an EndoCatch bag) without event.  Once the gallbladder was removed, the bed was inspected for hemostasis.  Once excellent hemostasis was obtained all gas and fluids were aspirated from above the liver, then the cannulas were removed.  The infraumbilical incision was closed using the pursestring suture which was in place.  0.25% bupivicaine with epinephrine was injected at all sites.  All 39mm or greater cannula sites were close using a running subcuticular stitch of 4-0 Monocryl.  5.52mm cannula sites were closed with Dermabond only.Steri-Strips and Tagaderm were used to complete the dressings at all sites.  At this point all needle, sponge, and instrument counts were correct.The patient was awakened from anesthesia and taken to the PACU in stable condition.  PATIENT DISPOSITION:  PACU - hemodynamically stable.   Cherylynn Ridges 5/28/201510:44 AM

## 2013-08-18 NOTE — Clinical Social Work Note (Signed)
CSW rec'd referral- patient is homeless per report- unable to assess today as he was having procedure done- will f/u tomorrow to meet with patient and assess-  Reece Levy, MSW, Theresia Majors [phone]

## 2013-08-18 NOTE — Transfer of Care (Signed)
Immediate Anesthesia Transfer of Care Note  Patient: Jimmy [name]  Procedure(s) Performed: Procedure(s): LAPAROSCOPIC CHOLECYSTECTOMY WITH INTRAOPERATIVE CHOLANGIOGRAM (N/A)  Patient Location: PACU  Anesthesia Type:General  Level of Consciousness: awake, alert  and oriented  Airway & Oxygen Therapy: Patient Spontanous Breathing and Patient connected to nasal cannula oxygen  Post-op Assessment: Report given to PACU RN, Post -op Vital signs reviewed and stable and Patient moving all extremities  Post vital signs: Reviewed and stable  Complications: No apparent anesthesia complications

## 2013-08-18 NOTE — Anesthesia Preprocedure Evaluation (Addendum)
Anesthesia Evaluation  Patient identified by MRN, date of birth, ID band Patient awake    Reviewed: Allergy & Precautions, H&P , NPO status , Patient's Chart, lab work & pertinent test results  Airway Mallampati: II TM Distance: >3 FB Neck ROM: Full    Dental  (+) Partial Upper, Poor Dentition, Dental Advisory Given   Pulmonary Current Smoker,  + rhonchi         Cardiovascular Rhythm:Regular Rate:Normal     Neuro/Psych    GI/Hepatic   Endo/Other    Renal/GU      Musculoskeletal   Abdominal   Peds  Hematology   Anesthesia Other Findings   Reproductive/Obstetrics                         Anesthesia Physical Anesthesia Plan  ASA: II  Anesthesia Plan: General   Post-op Pain Management:    Induction: Intravenous  Airway Management Planned: Oral ETT  Additional Equipment: None  Intra-op Plan:   Post-operative Plan: Extubation in OR  Informed Consent: I have reviewed the patients History and Physical, chart, labs and discussed the procedure including the risks, benefits and alternatives for the proposed anesthesia with the patient or authorized representative who has indicated his/her understanding and acceptance.   Dental advisory given  Plan Discussed with: CRNA, Anesthesiologist and Surgeon  Anesthesia Plan Comments:        Anesthesia Quick Evaluation

## 2013-08-19 ENCOUNTER — Encounter (HOSPITAL_COMMUNITY): Payer: Self-pay | Admitting: General Surgery

## 2013-08-19 DIAGNOSIS — Z720 Tobacco use: Secondary | ICD-10-CM

## 2013-08-19 DIAGNOSIS — F319 Bipolar disorder, unspecified: Secondary | ICD-10-CM

## 2013-08-19 DIAGNOSIS — F17200 Nicotine dependence, unspecified, uncomplicated: Secondary | ICD-10-CM

## 2013-08-19 HISTORY — DX: Nicotine dependence, unspecified, uncomplicated: F17.200

## 2013-08-19 HISTORY — DX: Bipolar disorder, unspecified: F31.9

## 2013-08-19 MED ORDER — ACETAMINOPHEN 325 MG PO TABS: 650.0000 mg | ORAL_TABLET | Freq: Four times a day (QID) | ORAL | Status: DC | PRN

## 2013-08-19 MED ORDER — HYDROCODONE-ACETAMINOPHEN 5-325 MG PO TABS: 1.0000 | ORAL_TABLET | ORAL | Status: DC | PRN

## 2013-08-19 NOTE — Care Management Note (Signed)
    Page 1 of 1   08/19/2013     10:28:53 AM CARE MANAGEMENT NOTE 08/19/2013  Patient:  Jimmy [name], Jimmy [name]   Account Number:  [id number]  Date Initiated:  08/19/2013  Documentation initiated by:  Ronny Flurry  Subjective/Objective Assessment:     Action/Plan:   Anticipated DC Date:  08/19/2013   Anticipated DC Plan:  HOME/SELF CARE  In-house referral  Financial Counselor  Clinical Social Worker      DC Associate Professor  CM consult  [facility]  [facility]      Choice offered to / List presented to:             Status of service:   Medicare Important Message given?   (If response is "NO", the following Medicare IM given date fields will be blank) Date Medicare IM given:   Date Additional Medicare IM given:    Discharge Disposition:    Per UR Regulation:    If discussed at Long Length of Stay Meetings, dates discussed:    Comments:  08-19-13 [facility] letter provided with over ride of co pay and Vicodin . Patinet voiced understanding . Also provided patient with [facility] and [facility] information. Ward Chatters RN BSN

## 2013-08-19 NOTE — Progress Notes (Signed)
1 Day Post-Op  Subjective: He feels better, sites look fine some bleeding from the lower site.  He has had breakfast and is dressed.  Nurse said he was offered temporary shelter, but he wants to go back to the tent where he and his brother reside.  He has no money for food, or medicines.  Objective: Vital signs in last 24 hours: Temp:  [97.3 F (36.3 C)-97.7 F (36.5 C)] 97.5 F (36.4 C) (05/29 0555) Pulse Rate:  [54-65] 58 (05/29 0555) Resp:  [11-22] 18 (05/29 0555) BP: (115-149)/(60-81) 149/76 mmHg (05/29 0555) SpO2:  [98 %-100 %] 100 % (05/29 0555) Last BM Date: 08/16/13  Intake/Output from previous day: 05/28 0701 - 05/29 0700 In: 2150 [P.O.:1250; I.V.:900] Out: 2200 [Urine:2200] Intake/Output this shift: Total I/O In: 480 [P.O.:480] Out: -   General appearance: alert, cooperative and dressed and wants to go.  Resp: clear to auscultation bilaterally GI: soft sore, not really complaining.  tolerated Am diet, but had some mild nausea.  Lab Results:   Recent Labs  08/16/13 1625 08/17/13 0704  WBC 5.5 4.3  HGB 12.3* 12.8*  HCT 34.8* 37.0*  PLT 329 309    BMET  Recent Labs  08/17/13 1018 08/18/13 0428  NA 137 135*  K 3.8 4.9  CL 97 104  CO2 29 22  GLUCOSE 117* 98  BUN 6 5*  CREATININE 0.71 0.63  CALCIUM 9.2 8.3*   PT/INR No results found for this basename: LABPROT, INR,  in the last 72 hours   Recent Labs Lab 08/16/13 1625 08/17/13 0704 08/18/13 0428  AST 32 28 25  ALT 21 17 16   ALKPHOS 70 63 56  BILITOT 0.2* 0.4 <0.2*  PROT 7.7 6.8 6.2  ALBUMIN 3.6 3.1* 2.8*     Lipase     Component Value Date/Time   LIPASE 64* 08/16/2013 1625     Studies/Results: Dg Cholangiogram Operative  08/18/2013   CLINICAL DATA:  Laparoscopic cholecystectomy  EXAM: INTRAOPERATIVE CHOLANGIOGRAM  TECHNIQUE: Cholangiographic images from the C-arm fluoroscopic device were submitted for interpretation post-operatively. Please see the procedural report for the amount of  contrast and the fluoroscopy time utilized.  COMPARISON:  08/04/2013  FINDINGS: Intraoperative cholangiogram performed following the laparoscopic cholecystectomy. Biliary confluence, common hepatic duct, and common bile duct are all patent. No biliary obstruction, dilatation, filling defect or retained stone appreciated. Residual cystic duct noted. Leakage of contrast at the injection site evident. Contrast opacifies the duodenum without obstruction.  IMPRESSION: Patent biliary system.   Electronically Signed   By: Ruel Favors M.D.   On: 08/18/2013 11:05    Medications: . enoxaparin (LOVENOX) injection  40 mg Subcutaneous Q24H  . feeding supplement (RESOURCE BREEZE)  1 Container Oral BID BM  . folic acid  1 mg Oral Daily  . LORazepam  0-4 mg Oral Q12H  . multivitamin with minerals  1 tablet Oral Daily  . nicotine  21 mg Transdermal Daily  . thiamine  100 mg Oral Daily   Or  . thiamine  100 mg Intravenous Daily    Assessment/Plan Symptomatic cholelithiasis with ongoing nausea and vomiting  LAPAROSCOPIC CHOLECYSTECTOMY WITH INTRAOPERATIVE CHOLANGIOGRAM, Cherylynn Ridges, MD  Alcohol and Drug (marajuana use)  Tobacco use Homeless  schizophrenia  Bipolar disorder  Depression    Plan: I will contact social worker and see what we can do.  He is asking for bus fare.. I will get him into the clinic next week too.  LOS: 3 days    Sherrie GeorgeWillard Vercie Pokorny 08/19/2013

## 2013-08-19 NOTE — Discharge Instructions (Signed)
Laparoscopic Cholecystectomy °Laparoscopic cholecystectomy is surgery to remove the gallbladder. The gallbladder is located in the upper right part of the abdomen, behind the liver. It is a storage sac for bile produced in the liver. Bile aids in the digestion and absorption of fats. Cholecystectomy is often done for inflammation of the gallbladder (cholecystitis). This condition is usually caused by a buildup of gallstones (cholelithiasis) in your gallbladder. Gallstones can block the flow of bile, resulting in inflammation and pain. In severe cases, emergency surgery may be required. When emergency surgery is not required, you will have time to prepare for the procedure. °Laparoscopic surgery is an alternative to open surgery. Laparoscopic surgery has a shorter recovery time. Your common bile duct may also need to be examined during the procedure. If stones are found in the common bile duct, they may be removed. °LET YOUR HEALTH CARE PROVIDER KNOW ABOUT: °· Any allergies you have. °· All medicines you are taking, including vitamins, herbs, eye drops, creams, and over-the-counter medicines. °· Previous problems you or members of your family have had with the use of anesthetics. °· Any blood disorders you have. °· Previous surgeries you have had. °· Medical conditions you have. °RISKS AND COMPLICATIONS °Generally, this is a safe procedure. However, as with any procedure, complications can occur. Possible complications include: °· Infection. °· Damage to the common bile duct, nerves, arteries, veins, or other internal organs such as the stomach, liver, or intestines. °· Bleeding. °· A stone may remain in the common bile duct. °· A bile leak from the cyst duct that is clipped when your gallbladder is removed. °· The need to convert to open surgery, which requires a larger incision in the abdomen. This may be necessary if your surgeon thinks it is not safe to continue with a laparoscopic procedure. °BEFORE THE  PROCEDURE °· Ask your health care provider about changing or stopping any regular medicines. You will need to stop taking aspirin or blood thinners at least 5 days prior to surgery. °· Do not eat or drink anything after midnight the night before surgery. °· Let your health care provider know if you develop a cold or other infectious problem before surgery. °PROCEDURE  °· You will be given medicine to make you sleep through the procedure (general anesthetic). A breathing tube will be placed in your mouth. °· When you are asleep, your surgeon will make several small cuts (incisions) in your abdomen. °· A thin, lighted tube with a tiny camera on the end (laparoscope) is inserted through one of the small incisions. The camera on the laparoscope sends a picture to a TV screen in the operating room. This gives the surgeon a good view inside your abdomen. °· A gas will be pumped into your abdomen. This expands your abdomen so that the surgeon has more room to perform the surgery. °· Other tools needed for the procedure are inserted through the other incisions. The gallbladder is removed through one of the incisions. °· After the removal of your gallbladder, the incisions will be closed with stitches, staples, or skin glue. °AFTER THE PROCEDURE °· You will be taken to a recovery area where your progress will be checked often. °· You may be allowed to go home the same day if your pain is controlled and you can tolerate liquids. °Document Released: 03/10/2005 Document Revised: 12/29/2012 Document Reviewed: 10/20/2012 °ExitCare® Patient Information ©2014 [employer]. ° °[facility] ______[facility] °LAPAROSCOPIC SURGERY: POST OP INSTRUCTIONS °Always review your discharge instruction sheet   given to you by the facility where your surgery was performed. °IF YOU HAVE DISABILITY OR FAMILY LEAVE FORMS, YOU MUST BRING THEM TO THE OFFICE FOR PROCESSING.   °DO NOT GIVE THEM TO YOUR DOCTOR. ° °1. A prescription for pain  medication may be given to you upon discharge.  Take your pain medication as prescribed, if needed.  If narcotic pain medicine is not needed, then you may take acetaminophen (Tylenol) or ibuprofen (Advil) as needed. °2. Take your usually prescribed medications unless otherwise directed. °3. If you need a refill on your pain medication, please contact your pharmacy.  They will contact our office to request authorization. Prescriptions will not be filled after 5pm or on week-ends. °4. You should follow a light diet the first few days after arrival home, such as soup and crackers, etc.  Be sure to include lots of fluids daily. °5. Most patients will experience some swelling and bruising in the area of the incisions.  Ice packs will help.  Swelling and bruising can take several days to resolve.  °6. It is common to experience some constipation if taking pain medication after surgery.  Increasing fluid intake and taking a stool softener (such as Colace) will usually help or prevent this problem from occurring.  A mild laxative (Milk of Magnesia or Miralax) should be taken according to package instructions if there are no bowel movements after 48 hours. °7. Unless discharge instructions indicate otherwise, you may remove your bandages 24-48 hours after surgery, and you may shower at that time.  You may have steri-strips (small skin tapes) in place directly over the incision.  These strips should be left on the skin for 7-10 days.  If your surgeon used skin glue on the incision, you may shower in 24 hours.  The glue will flake off over the next 2-3 weeks.  Any sutures or staples will be removed at the office during your follow-up visit. °8. ACTIVITIES:  You may resume regular (light) daily activities beginning the next day--such as daily self-care, walking, climbing stairs--gradually increasing activities as tolerated.  You may have sexual intercourse when it is comfortable.  Refrain from any heavy lifting or straining  until approved by your doctor. °a. You may drive when you are no longer taking prescription pain medication, you can comfortably wear a seatbelt, and you can safely maneuver your car and apply brakes. °b. RETURN TO WORK:  __________________________________________________________ °9. You should see your doctor in the office for a follow-up appointment approximately 2-3 weeks after your surgery.  Make sure that you call for this appointment within a day or two after you arrive home to insure a convenient appointment time. °10. OTHER INSTRUCTIONS: __________________________________________________________________________________________________________________________ __________________________________________________________________________________________________________________________ °WHEN TO CALL YOUR DOCTOR: °1. Fever over 101.0 °2. Inability to urinate °3. Continued bleeding from incision. °4. Increased pain, redness, or drainage from the incision. °5. Increasing abdominal pain ° °The clinic staff is available to answer your questions during regular business hours.  Please don’t hesitate to call and ask to speak to one of the nurses for clinical concerns.  If you have a medical emergency, go to the nearest emergency room or call 911.  A surgeon from [facility] is always on call at the hospital. °[address], [address], [place], [place]  [address] ? [address] [address], [place], [place]   [address] °[phone] ? [phone] ? FAX [phone] °Web site: [email or web address] ° °

## 2013-08-19 NOTE — Progress Notes (Signed)
Pt smelled like cigarette smoke. Pt admitted to smoking bathroom. Hospital policy explained to pt. [name] cigarettes labeled and in pt's chart.

## 2013-08-19 NOTE — Discharge Summary (Signed)
Physician Discharge Summary  Patient ID: Jimmy [name] MRN: [id number] DOB/AGE: [birth year] 58 y.o.  Admit date: 08/16/2013 Discharge date: 08/19/2013  Admission Diagnoses:  Symptomatic cholelithiasis with ongoing nausea and vomiting  Alcohol and Drug (marajuana use)  Tobacco use  Homeless  schizophrenia  Bipolar disorder  Depression    Discharge Diagnoses:  Same    Active Problems:   Biliary colic   Malnutrition of moderate degree   PROCEDURES: LAPAROSCOPIC CHOLECYSTECTOMY WITH INTRAOPERATIVE CHOLANGIOGRAM, Cherylynn Ridges, MD    Hospital Course: pt presents with RUQ pain and hx of gallstones. Has been to ED 3 times in 6 days with RUQ pain. U/S shows gallstones. He is homeless and drinks 12 beers a day or more.  He was admitted by Dr. Luisa Hart and placed on the [facility] protocol.  He was found using BR to smoke post op but over all did well.  We have ask Social services to see, and offer assistance.  He wants to go back to his tent that he and his brother live in.  I have double booked him in the office next week so he can be followed up closer than most.  Condition on d/c:  Improving   Disposition: 01-Home or Self Care     Medication List    STOP taking these medications       oxyCODONE 5 MG immediate release tablet  Commonly known as:  Oxy IR/ROXICODONE     promethazine 25 MG tablet  Commonly known as:  PHENERGAN      TAKE these medications       acetaminophen 325 MG tablet  Commonly known as:  TYLENOL  Take 2 tablets (650 mg total) by mouth every 6 (six) hours as needed.     HYDROcodone-acetaminophen 5-325 MG per tablet  Commonly known as:  NORCO/VICODIN  Take 1-2 tablets by mouth every 4 (four) hours as needed for moderate pain. Do not take more than 10 tablets containing acetaminophen per day.     ondansetron 8 MG disintegrating tablet  Commonly known as:  ZOFRAN ODT  Take 1 tablet (8 mg total) by mouth every 8 (eight) hours as needed for nausea or  vomiting.           Follow-up Information   Follow up with [facility] On 08/23/2013. (I have you set up to come in for a 2 PM appointment.  Be there for check in at 1:30.)    Contact information:   [address]   [place] [address] [phone]       Signed: Sherrie George 08/19/2013, 9:46 AM

## 2013-08-19 NOTE — Clinical Social Work Psychosocial (Signed)
Clinical Social Work Department BRIEF PSYCHOSOCIAL ASSESSMENT 08/19/2013  Patient:  Jimmy [name], Jimmy [name]     Account Number:  [id number]     Admit date:  08/16/2013  Clinical Social Worker:  Robin Searing  Date/Time:  08/19/2013 01:33 PM  Referred by:  Physician  Date Referred:  08/18/2013 Referred for  Homelessness   Other Referral:   Interview type:  Patient Other interview type:    PSYCHOSOCIAL DATA Living Status:  FAMILY Admitted from facility:   Level of care:   Primary support name:  brother Primary support relationship to patient:  FAMILY Degree of support available:   minimal- living at a campsite in a tent with brother    CURRENT CONCERNS Current Concerns  Other - See comment   Other Concerns:    SOCIAL WORK ASSESSMENT / PLAN Patient admitted to hospital and is now being d/c' ed- patient reports to me that he and his brother have been homeless and living together for a few years- before that, he was married and living in [place]- he shared with CSW that his ex-wife died and he has been told he ia eligibile for some income via her social security- he is planning to apply for this on Monday in [place].   Assessment/plan status:  Other - See comment Other assessment/ plan:   patient requesting assistance with transportation as well as with meds- RNCM contacted for med assist- CSW has assisted patient with Bus pass/PART bus fare and resources to consider- shelter, food pantry/soup kitchen, etc.   Information/referral to community resources:   [employer] info    PATIENT'S/FAMILY'S RESPONSE TO PLAN OF CARE: Patient for [employer] today- he is dressed and eager to go -     Jimmy [name], MSW, [employer] [phone]

## 2013-08-19 NOTE — Progress Notes (Signed)
General surgery: I have interviewed and examined this patient. I agree with the assessment, treatment plan, and discharge plans as outlined by Mr. Marlyne Beards, [place]   Angelia Mould. Derrell Lolling, M.D., [facility] General and Minimally invasive Surgery Breast and Colorectal Surgery Office:   [phone] Pager:   [phone]

## 2013-08-19 NOTE — Discharge Summary (Signed)
I agree with the discharge plans in followup arrangements outlined by Mr. Marlyne Beards, [place].  Angelia Mould. Derrell Lolling, M.D., [facility] General and Minimally invasive Surgery Breast and Colorectal Surgery Office:   [phone] Pager:   [phone]

## 2013-08-22 ENCOUNTER — Encounter (HOSPITAL_COMMUNITY): Payer: Self-pay | Admitting: General Surgery

## 2013-08-23 ENCOUNTER — Encounter (HOSPITAL_COMMUNITY): Payer: Self-pay | Admitting: Emergency Medicine

## 2013-08-23 ENCOUNTER — Emergency Department (HOSPITAL_COMMUNITY): Payer: Self-pay

## 2013-08-23 ENCOUNTER — Encounter (INDEPENDENT_AMBULATORY_CARE_PROVIDER_SITE_OTHER): Payer: Self-pay

## 2013-08-23 ENCOUNTER — Emergency Department (HOSPITAL_COMMUNITY)
Admission: EM | Admit: 2013-08-23 | Discharge: 2013-08-24 | Disposition: A | Discharge status: Home / Self Care | Payer: Self-pay | Attending: Emergency Medicine | Admitting: Emergency Medicine

## 2013-08-23 ENCOUNTER — Ambulatory Visit (INDEPENDENT_AMBULATORY_CARE_PROVIDER_SITE_OTHER): Payer: Self-pay | Admitting: General Surgery

## 2013-08-23 ENCOUNTER — Emergency Department (HOSPITAL_COMMUNITY)
Admission: EM | Admit: 2013-08-23 | Discharge: 2013-08-23 | Discharge status: Against Medical Advice | Payer: Self-pay | Attending: Emergency Medicine | Admitting: Emergency Medicine

## 2013-08-23 VITALS — BP 110/65 | HR 91 | Temp 98.1°F | Resp 20 | Ht 68.0 in | Wt 147.0 lb

## 2013-08-23 DIAGNOSIS — S6990XA Unspecified injury of unspecified wrist, hand and finger(s), initial encounter: Secondary | ICD-10-CM

## 2013-08-23 DIAGNOSIS — S0993XA Unspecified injury of face, initial encounter: Secondary | ICD-10-CM | POA: Insufficient documentation

## 2013-08-23 DIAGNOSIS — S59909A Unspecified injury of unspecified elbow, initial encounter: Secondary | ICD-10-CM | POA: Insufficient documentation

## 2013-08-23 DIAGNOSIS — Z5900 Homelessness unspecified: Secondary | ICD-10-CM | POA: Insufficient documentation

## 2013-08-23 DIAGNOSIS — Y929 Unspecified place or not applicable: Secondary | ICD-10-CM | POA: Insufficient documentation

## 2013-08-23 DIAGNOSIS — R569 Unspecified convulsions: Secondary | ICD-10-CM | POA: Diagnosis present

## 2013-08-23 DIAGNOSIS — Z4801 Encounter for change or removal of surgical wound dressing: Secondary | ICD-10-CM | POA: Insufficient documentation

## 2013-08-23 DIAGNOSIS — Z8719 Personal history of other diseases of the digestive system: Secondary | ICD-10-CM | POA: Insufficient documentation

## 2013-08-23 DIAGNOSIS — S0180XA Unspecified open wound of other part of head, initial encounter: Secondary | ICD-10-CM | POA: Insufficient documentation

## 2013-08-23 DIAGNOSIS — Z9889 Other specified postprocedural states: Secondary | ICD-10-CM | POA: Insufficient documentation

## 2013-08-23 DIAGNOSIS — E871 Hypo-osmolality and hyponatremia: Secondary | ICD-10-CM | POA: Insufficient documentation

## 2013-08-23 DIAGNOSIS — F17200 Nicotine dependence, unspecified, uncomplicated: Secondary | ICD-10-CM | POA: Insufficient documentation

## 2013-08-23 DIAGNOSIS — S199XXA Unspecified injury of neck, initial encounter: Secondary | ICD-10-CM

## 2013-08-23 DIAGNOSIS — Y939 Activity, unspecified: Secondary | ICD-10-CM | POA: Insufficient documentation

## 2013-08-23 DIAGNOSIS — W1809XA Striking against other object with subsequent fall, initial encounter: Secondary | ICD-10-CM | POA: Insufficient documentation

## 2013-08-23 DIAGNOSIS — F209 Schizophrenia, unspecified: Secondary | ICD-10-CM | POA: Insufficient documentation

## 2013-08-23 DIAGNOSIS — F1020 Alcohol dependence, uncomplicated: Secondary | ICD-10-CM | POA: Diagnosis present

## 2013-08-23 DIAGNOSIS — S59919A Unspecified injury of unspecified forearm, initial encounter: Secondary | ICD-10-CM

## 2013-08-23 DIAGNOSIS — Z8659 Personal history of other mental and behavioral disorders: Secondary | ICD-10-CM | POA: Insufficient documentation

## 2013-08-23 DIAGNOSIS — K811 Chronic cholecystitis: Secondary | ICD-10-CM

## 2013-08-23 LAB — I-STAT CHEM 8, ED
BUN: 11 mg/dL (ref 6–23)
CREATININE: 1.2 mg/dL (ref 0.50–1.35)
Calcium, Ion: 1.02 mmol/L — ABNORMAL LOW (ref 1.12–1.23)
Chloride: 83 mEq/L — ABNORMAL LOW (ref 96–112)
Glucose, Bld: 99 mg/dL (ref 70–99)
HCT: 42 % (ref 39.0–52.0)
Hemoglobin: 14.3 g/dL (ref 13.0–17.0)
Potassium: 3.2 mEq/L — ABNORMAL LOW (ref 3.7–5.3)
Sodium: 125 mEq/L — ABNORMAL LOW (ref 137–147)
TCO2: 26 mmol/L (ref 0–100)

## 2013-08-23 LAB — CBC WITH DIFFERENTIAL/PLATELET
Basophils Absolute: 0.1 10*3/uL (ref 0.0–0.1)
Basophils Relative: 1 % (ref 0–1)
Eosinophils Absolute: 0.2 10*3/uL (ref 0.0–0.7)
Eosinophils Relative: 2 % (ref 0–5)
HCT: 35.6 % — ABNORMAL LOW (ref 39.0–52.0)
Hemoglobin: 12.7 g/dL — ABNORMAL LOW (ref 13.0–17.0)
Lymphocytes Relative: 18 % (ref 12–46)
Lymphs Abs: 1.7 10*3/uL (ref 0.7–4.0)
MCH: 32.8 pg (ref 26.0–34.0)
MCHC: 35.7 g/dL (ref 30.0–36.0)
MCV: 92 fL (ref 78.0–100.0)
Monocytes Absolute: 1 10*3/uL (ref 0.1–1.0)
Monocytes Relative: 11 % (ref 3–12)
NEUTROS PCT: 68 % (ref 43–77)
Neutro Abs: 6.6 10*3/uL (ref 1.7–7.7)
Platelets: 397 10*3/uL (ref 150–400)
RBC: 3.87 MIL/uL — ABNORMAL LOW (ref 4.22–5.81)
RDW: 12.1 % (ref 11.5–15.5)
WBC: 9.6 10*3/uL (ref 4.0–10.5)

## 2013-08-23 LAB — ETHANOL: Alcohol, Ethyl (B): <11 mg/dL (ref 0–11)

## 2013-08-23 MED ORDER — SODIUM CHLORIDE 0.9 % IV BOLUS (SEPSIS)
1000.0000 mL | Freq: Once | INTRAVENOUS | Status: AC
  Administered 2013-08-23: 1000 mL via INTRAVENOUS

## 2013-08-23 NOTE — Patient Instructions (Signed)
Go to shelter and shower;  Call if you have any problems, take steri strips off later this week.

## 2013-08-23 NOTE — ED Notes (Signed)
Pt had recent gallbladder surgery on 5/28 and reports needing to be re-eval after surgery. Pt is homeless and + etoh today. No acute distress noted at triage.

## 2013-08-23 NOTE — ED Notes (Addendum)
Initial contact- per patient he remembers trying to take a bath. Doesn't remember having a seizure. Denies illicit drug use but did drink alcohol today. Laceration noted above right eye. No longer bleeding. Moving all extremities. Talking and A&Ox4. No other complaints at this time. In NAD. Stabilized. Patient poor historian. Hx bipolar (since 1991), polysubstance abuse (cannot remember last intake). Currently homeless.

## 2013-08-23 NOTE — Progress Notes (Signed)
Jimmy [name] 1955-10-14 [id number] 08/23/2013 Gallbladder - CHRONIC CHOLECYSTITIS.    Jimmy [name] is a 58 y.o. male who had a laparoscopic cholecystectomy with intraoperative cholangiogram by Dr. Jimmye Norman.  The pathology report confirmed;  Gallbladder - CHRONIC CHOLECYSTITIS.   The patient reports that they are feeling well with normal bowel movements and good appetite.  The pre-operative symptoms of abdominal pain, nausea, and vomiting have resolved.    Physical examination - He is back and drinking Incisions appear well-healed with no sign of infection or bleeding.   Abdomen - soft, non-tender  Impression:  s/p laparoscopic cholecystectomy  Plan:  He may resume a regular diet and full activity.  He may follow-up on a PRN basis.

## 2013-08-23 NOTE — ED Notes (Signed)
Per notes, pt had appt today at [facility] at 2:00, pt informed of this and decided to leave ED to go to appt. Told to return here if needed.

## 2013-08-23 NOTE — ED Notes (Signed)
Per EMS- pt in shower. Family witnessed grand mal seizure while patient was in bathtub. Has 1-1.5 inch laceration above right eye. He was initially post ictal. Placed on 2L O2 [place]. Stabilized. No complaints at this time. Possible polysubstace abuse. Found crystal meth in hotel room. Multiple people living in hotel room. Pt claims this is not his. Bruising on abdomen from recent gall bladder removal. Was seen at [facility] last night to have stitches removed. Not tender to palpation. VS: BP 94/70 sitting HR 110 SpO2 98% on RA.
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
## 2013-08-23 NOTE — ED Notes (Signed)
Bed: WA01 Expected date:  Expected time:  Means of arrival:  Comments: EMS-seizure 

## 2013-08-24 ENCOUNTER — Encounter (HOSPITAL_COMMUNITY): Payer: Self-pay | Admitting: Registered Nurse

## 2013-08-24 DIAGNOSIS — R569 Unspecified convulsions: Secondary | ICD-10-CM | POA: Diagnosis present

## 2013-08-24 LAB — I-STAT CHEM 8, ED
BUN: 11 mg/dL (ref 6–23)
CHLORIDE: 86 meq/L — AB (ref 96–112)
Calcium, Ion: 1 mmol/L — ABNORMAL LOW (ref 1.12–1.23)
Creatinine, Ser: 1 mg/dL (ref 0.50–1.35)
GLUCOSE: 92 mg/dL (ref 70–99)
HCT: 39 % (ref 39.0–52.0)
Hemoglobin: 13.3 g/dL (ref 13.0–17.0)
Potassium: 3.2 mEq/L — ABNORMAL LOW (ref 3.7–5.3)
Sodium: 129 mEq/L — ABNORMAL LOW (ref 137–147)
TCO2: 25 mmol/L (ref 0–100)

## 2013-08-24 LAB — RAPID URINE DRUG SCREEN, HOSP PERFORMED
Amphetamines: NOT DETECTED
Barbiturates: NOT DETECTED
Benzodiazepines: NOT DETECTED
Cocaine: NOT DETECTED
OPIATES: NOT DETECTED
Tetrahydrocannabinol: NOT DETECTED

## 2013-08-24 LAB — HEPATIC FUNCTION PANEL
ALBUMIN: 3.7 g/dL (ref 3.5–5.2)
ALT: 19 U/L (ref 0–53)
AST: 32 U/L (ref 0–37)
Alkaline Phosphatase: 75 U/L (ref 39–117)
Bilirubin, Direct: <0.2 mg/dL (ref 0.0–0.3)
Total Bilirubin: 0.5 mg/dL (ref 0.3–1.2)
Total Protein: 7.7 g/dL (ref 6.0–8.3)

## 2013-08-24 MED ORDER — LORAZEPAM 1 MG PO TABS
0.0000 mg | ORAL_TABLET | Freq: Two times a day (BID) | ORAL | Status: DC
Start: 2013-08-26 — End: 2013-08-24

## 2013-08-24 MED ORDER — LORAZEPAM 1 MG PO TABS
0.0000 mg | ORAL_TABLET | Freq: Four times a day (QID) | ORAL | Status: DC
  Administered 2013-08-24: 1 mg via ORAL
  Filled 2013-08-24: qty 1

## 2013-08-24 NOTE — ED Notes (Addendum)
Pt has been searched and wanded by security along with his belongings

## 2013-08-24 NOTE — ED Notes (Addendum)
Patient arrived. Seizure pads placed on bed. Gatorade and ham sandwich given. Patient denies SI, HI, and hallucinations. Denies pain. Given tv remote. No other complaints at this time. Speech difficult to understand. Gets loud and agitated when staff asks him to repeat himself. Billy Coast, RN

## 2013-08-24 NOTE — ED Notes (Signed)
D/C instructions reviewed-stated he did not want any community resources. Bus pass provided. Denies pain. No complaints voiced. Ambulatory without difficulty. Belongings bag x3 returned. Escorted by mental health tech to front of hospital.

## 2013-08-24 NOTE — Consult Note (Signed)
[facility] Face-to-Face Psychiatry Consult   Reason for Consult:  Seizure Referring Physician:  EDP  Jimmy [name] is an 58 y.o. male. Total Time spent with patient: 45 minutes  Assessment: AXIS I:  Alcohol Abuse and Substance Abuse AXIS II:  Deferred AXIS III:   Past Medical History  Diagnosis Date  . Alcohol abuse   . Schizophrenia   . Bipolar 1 disorder   . Mental disorder   . Depression   . Gallstones dx'd 08/04/2013  . GERD (gastroesophageal reflux disease)   . Alcohol related seizure ~ 2007    "I've had one"  . Bipolar disorder, unspecified 08/19/2013    History reported  . Tobacco use disorder 08/19/2013   AXIS IV:  other psychosocial or environmental problems AXIS V:  61-70 mild symptoms  Plan:  No evidence of imminent risk to self or others at present.   Patient does not meet criteria for psychiatric inpatient admission. Supportive therapy provided about ongoing stressors. Discussed crisis plan, support from social network, calling 911, coming to the Emergency Department, and calling Suicide Hotline.  Subjective:   Jimmy [name] is a 58 y.o. male patient.Marland Kitchen  HPI:  Patient states "I'm ready to leave. I came to the hospital cause of a seizure.  It's been a long time since I had one. I only drink 2 beers yesterday. I drink everyday usually four to five 40's. No I don't want detox cause I know I'm not going to stop."  Patient denies suicidal/homicidal ideation, psychosis, and paranoia.  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   HPI Elements:   Location:  Alcohol abuse. Quality:  daily consumption of alcolol. Severity:  seizure. Timing:  1 day.  Review of Systems  Constitutional: Negative for weight loss, malaise/fatigue and diaphoresis.  Gastrointestinal: Negative for nausea, vomiting,  abdominal pain, diarrhea and constipation.  Musculoskeletal: Negative.   Neurological: Negative for tremors and headaches.  Psychiatric/Behavioral: Positive for substance abuse. Negative for depression, suicidal ideas, hallucinations (Denies) and memory loss. The patient is not nervous/anxious and does not have insomnia.     Denies family history of mental illness Past Psychiatric History: Past Medical History  Diagnosis Date  . Alcohol abuse   . Schizophrenia   . Bipolar 1 disorder   . Mental disorder   . Depression   . Gallstones dx'd 08/04/2013  . GERD (gastroesophageal reflux disease)   . Alcohol related seizure ~ 2007    "I've had one"  . Bipolar disorder, unspecified 08/19/2013    History reported  . Tobacco use disorder 08/19/2013    reports that he has been smoking Cigarettes.  He has a 80 pack-year smoking history. He does not have any smokeless tobacco history on file. He reports that he drinks about 50.4 ounces of alcohol per week. He reports that he uses illicit drugs (Cocaine and Marijuana). History reviewed. No pertinent family history.         Allergies:  No Known Allergies  ACT Assessment Complete:  No:   Past Psychiatric History: Diagnosis:  Alcohol Abuse, Seizure  Hospitalizations:  Denies  Outpatient Care:  Denies  Substance Abuse Care:  Alcohol, Cocaine  Self-Mutilation:  Denies  Suicidal Attempts:  Denies  Homicidal Behaviors:  Denies   Violent Behaviors:  Denies   Place of Residence:  [facility]  Status:  Single Employed/Unemployed:  Unemployed Education:   Family Supports:  Yes Objective: Blood pressure 108/67, pulse 82, temperature 97.3 F (36.3 C), temperature source Oral, resp. rate 18, SpO2 100.00%.There is no weight on file to calculate BMI. Results for orders placed during the hospital encounter of 08/23/13 (from the past 72 hour(s))  CBC WITH DIFFERENTIAL     Status: Abnormal   Collection Time    08/23/13  9:37 PM      Result  Value Ref Range   WBC 9.6  4.0 - 10.5 K/uL   RBC 3.87 (*) 4.22 - 5.81 MIL/uL   Hemoglobin 12.7 (*) 13.0 - 17.0 g/dL   HCT 40.935.6 (*) 81.139.0 - 91.452.0 %   MCV 92.0  78.0 - 100.0 fL   MCH 32.8  26.0 - 34.0 pg   MCHC 35.7  30.0 - 36.0 g/dL   RDW 78.212.1  95.611.5 - 21.315.5 %   Platelets 397  150 - 400 K/uL   Neutrophils Relative % 68  43 - 77 %   Neutro Abs 6.6  1.7 - 7.7 K/uL   Lymphocytes Relative 18  12 - 46 %   Lymphs Abs 1.7  0.7 - 4.0 K/uL   Monocytes Relative 11  3 - 12 %   Monocytes Absolute 1.0  0.1 - 1.0 K/uL   Eosinophils Relative 2  0 - 5 %   Eosinophils Absolute 0.2  0.0 - 0.7 K/uL   Basophils Relative 1  0 - 1 %   Basophils Absolute 0.1  0.0 - 0.1 K/uL  ETHANOL     Status: None   Collection Time    08/23/13  9:37 PM      Result Value Ref Range   Alcohol, Ethyl (B) <11  0 - 11 mg/dL   Comment:            LOWEST DETECTABLE LIMIT FOR     SERUM ALCOHOL IS 11 mg/dL     FOR MEDICAL PURPOSES ONLY  HEPATIC FUNCTION PANEL     Status: None   Collection Time    08/23/13  9:37 PM      Result Value Ref Range   Total Protein 7.7  6.0 - 8.3 g/dL   Albumin 3.7  3.5 - 5.2 g/dL   AST 32  0 - 37 U/L   ALT 19  0 - 53 U/L   Alkaline Phosphatase 75  39 - 117 U/L   Total Bilirubin 0.5  0.3 - 1.2 mg/dL   Bilirubin, Direct <0.8<0.2  0.0 - 0.3 mg/dL   Indirect Bilirubin NOT CALCULATED  0.3 - 0.9 mg/dL  I-STAT CHEM 8, ED     Status: Abnormal   Collection Time    08/23/13  9:46 PM      Result Value Ref Range   Sodium 125 (*) 137 - 147 mEq/L   Potassium 3.2 (*) 3.7 - 5.3 mEq/L   Chloride 83 (*) 96 - 112 mEq/L   BUN 11  6 - 23 mg/dL   Creatinine, Ser 6.571.20  0.50 - 1.35 mg/dL   Glucose, Bld 99  70 - 99 mg/dL   Calcium, Ion 8.461.02 (*) 1.12 - 1.23 mmol/L   TCO2 26  0 - 100 mmol/L   Hemoglobin 14.3  13.0 - 17.0 g/dL   HCT 96.242.0  95.239.0 - 84.152.0 %  I-STAT CHEM 8, ED     Status: Abnormal   Collection Time    08/24/13 12:11 AM  Result Value Ref Range   Sodium 129 (*) 137 - 147 mEq/L   Potassium 3.2 (*) 3.7  - 5.3 mEq/L   Chloride 86 (*) 96 - 112 mEq/L   BUN 11  6 - 23 mg/dL   Creatinine, Ser 4.09  0.50 - 1.35 mg/dL   Glucose, Bld 92  70 - 99 mg/dL   Calcium, Ion 8.11 (*) 1.12 - 1.23 mmol/L   TCO2 25  0 - 100 mmol/L   Hemoglobin 13.3  13.0 - 17.0 g/dL   HCT 91.4  78.2 - 95.6 %  URINE RAPID DRUG SCREEN (HOSP PERFORMED)     Status: None   Collection Time    08/24/13  1:25 AM      Result Value Ref Range   Opiates NONE DETECTED  NONE DETECTED   Cocaine NONE DETECTED  NONE DETECTED   Benzodiazepines NONE DETECTED  NONE DETECTED   Amphetamines NONE DETECTED  NONE DETECTED   Tetrahydrocannabinol NONE DETECTED  NONE DETECTED   Barbiturates NONE DETECTED  NONE DETECTED   Comment:            DRUG SCREEN FOR MEDICAL PURPOSES     ONLY.  IF CONFIRMATION IS NEEDED     FOR ANY PURPOSE, NOTIFY LAB     WITHIN 5 DAYS.                LOWEST DETECTABLE LIMITS     FOR URINE DRUG SCREEN     Drug Class       Cutoff (ng/mL)     Amphetamine      1000     Barbiturate      200     Benzodiazepine   200     Tricyclics       300     Opiates          300     Cocaine          300     THC              50   Labs are reviewed see above values.  Medications reviewed and no changes made.  Current Facility-Administered Medications  Medication Dose Route Frequency Provider Last Rate Last Dose  . LORazepam (ATIVAN) tablet 0-4 mg  0-4 mg Oral 4 times per day Rolan Bucco, MD   1 mg at 08/24/13 0124   Followed by  . [START ON 08/26/2013] LORazepam (ATIVAN) tablet 0-4 mg  0-4 mg Oral Q12H Rolan Bucco, MD       No current outpatient prescriptions on file.    Psychiatric Specialty Exam:     Blood pressure 108/67, pulse 82, temperature 97.3 F (36.3 C), temperature source Oral, resp. rate 18, SpO2 100.00%.There is no weight on file to calculate BMI.  General Appearance: Casual  Eye Contact::  Good  Speech:  Clear and Coherent and Normal Rate  Volume:  Normal  Mood:  "I'm in good mood; ready to go"  Affect:   Congruent  Thought Process:  Circumstantial  Orientation:  Full (Time, Place, and Person)  Thought Content:  Rumination  Suicidal Thoughts:  No  Homicidal Thoughts:  No  Memory:  Immediate;   Good Recent;   Good Remote;   Good  Judgement:  Intact  Insight:  Present  Psychomotor Activity:  Normal  Concentration:  Fair  Recall:  Good  Fund of Knowledge:Good  Language: Good  Akathisia:  No  Handed:  Right  AIMS (if indicated):  Assets:  Communication Skills Desire for Improvement Housing Social Support  Sleep:      Musculoskeletal: Strength & Muscle Tone: within normal limits Gait & Station: normal Patient leans: N/A  Treatment Plan Summary: Discharge home with resources for outpatient services  Ercole Georg, FNP-BC 08/24/2013 10:16 AM

## 2013-08-24 NOTE — ED Notes (Signed)
Pt belongings are currently at the nurses station, belongings consist of:  Yellow book bag, which contains: Personal hygiene products Iced tea packets  Black napsack containing water bottle and personal hygiene products Black napsack containing towels and ziplock bags full of cigarette butts or rolled cigarettes Blue sweat shirt and personal documents   Plastic clear garbage bag which contains: Merchant navy officer type wallet  Brown belt on pants Red shirt

## 2013-08-24 NOTE — Discharge Instructions (Signed)
Finding Treatment for Alcohol and Drug Addiction It can be hard to find the right place to get professional treatment. Here are some important things to consider:  There are different types of treatment to choose from.  Some programs are live-in (residential) while others are not (outpatient). Sometimes a combination is offered.  No single type of program is right for everyone.  Most treatment programs involve a combination of education, counseling, and a 12-step, spiritually-based approach.  There are non-spiritually based programs (not 12-step).  Some treatment programs are government sponsored. They are geared for patients without private insurance.  Treatment programs can vary in many respects such as:  Cost and types of insurance accepted.  Types of on-site medical services offered.  Length of stay, setting, and size.  Overall philosophy of treatment. A person may need specialized treatment or have needs not addressed by all programs. For example, adolescents need treatment appropriate for their age. Other people have secondary disorders that must be managed as well. Secondary conditions can include mental illness, such as depression or diabetes. Often, a period of detoxification from alcohol or drugs is needed. This requires medical supervision and not all programs offer this. THINGS TO CONSIDER WHEN SELECTING A TREATMENT PROGRAM   Is the program certified by the appropriate government agency? Even private programs must be certified and employ certified professionals.  Does the program accept your insurance? If not, can a payment plan be set up?  Is the facility clean, organized, and well run? Do they allow you to speak with graduates who can share their treatment experience with you? Can you tour the facility? Can you meet with staff?  Does the program meet the full range of individual needs?  Does the treatment program address sexual orientation and physical disabilities?  Do they provide age, gender, and culturally appropriate treatment services?  Is treatment available in languages other than English?  Is long-term aftercare support or guidance encouraged and provided?  Is assessment of an individual's treatment plan ongoing to ensure it meets changing needs?  Does the program use strategies to encourage reluctant patients to remain in treatment long enough to increase the likelihood of success?  Does the program offer counseling (individual or group) and other behavioral therapies?  Does the program offer medicine as part of the treatment regimen, if needed?  Is there ongoing monitoring of possible relapse? Is there a defined relapse prevention program? Are services or referrals offered to family members to ensure they understand addiction and the recovery process? This would help them support the recovering individual.  Are 12-step meetings held at the center or is transport available for patients to attend outside meetings? In countries outside of the [place] and [place], [employer], minerals for contact information for services in your area. Document Released: 02/06/2005 Document Revised: 06/02/2011 Document Reviewed: 08/19/2007 [facility] Patient Information 2014 [place].  Alcohol Problems Most adults who drink alcohol drink in moderation (not a lot) are at low risk for developing problems related to their drinking. However, all drinkers, including low-risk drinkers, should know about the health risks connected with drinking alcohol. RECOMMENDATIONS FOR LOW-RISK DRINKING  Drink in moderation. Moderate drinking is defined as follows:   Men - no more than 2 drinks per day.  Nonpregnant women - no more than 1 drink per day.  Over age 24 - no more than 1 drink per day. A standard drink is 12 grams of pure alcohol, which is equal to a 12 ounce bottle of beer or  wine cooler, a 5 ounce glass of wine, or 1.5 ounces of distilled spirits (such as  whiskey, brandy, vodka, or rum).  ABSTAIN FROM (DO NOT DRINK) ALCOHOL:  When pregnant or considering pregnancy.  When taking a medication that interacts with alcohol.  If you are alcohol dependent.  A medical condition that prohibits drinking alcohol (such as ulcer, liver disease, or heart disease). DISCUSS WITH YOUR CAREGIVER:  If you are at risk for coronary heart disease, discuss the potential benefits and risks of alcohol use: Light to moderate drinking is associated with lower rates of coronary heart disease in certain populations (for example, men over age 59 and postmenopausal women). Infrequent or nondrinkers are advised not to begin light to moderate drinking to reduce the risk of coronary heart disease so as to avoid creating an alcohol-related problem. Similar protective effects can likely be gained through proper diet and exercise.  Women and the elderly have smaller amounts of body water than men. As a result women and the elderly achieve a higher blood alcohol concentration after drinking the same amount of alcohol.  Exposing a fetus to alcohol can cause a broad range of birth defects referred to as Fetal Alcohol Syndrome (FAS) or Alcohol-Related Birth Defects (ARBD). Although FAS/ARBD is connected with excessive alcohol consumption during pregnancy, studies also have reported neurobehavioral problems in infants born to mothers reporting drinking an average of 1 drink per day during pregnancy.  Heavier drinking (the consumption of more than 4 drinks per occasion by men and more than 3 drinks per occasion by women) impairs learning (cognitive) and psychomotor functions and increases the risk of alcohol-related problems, including accidents and injuries. CAGE QUESTIONS:   Have you ever felt that you should Cut down on your drinking?  Have people Annoyed you by criticizing your drinking?  Have you ever felt bad or Guilty about your drinking?  Have you ever had a drink first  thing in the morning to steady your nerves or get rid of a hangover (Eye opener)? If you answered positively to any of these questions: You may be at risk for alcohol-related problems if alcohol consumption is:   Men: Greater than 14 drinks per week or more than 4 drinks per occasion.  Women: Greater than 7 drinks per week or more than 3 drinks per occasion. Do you or your family have a medical history of alcohol-related problems, such as:  Blackouts.  Sexual dysfunction.  Depression.  Trauma.  Liver dysfunction.  Sleep disorders.  Hypertension.  Chronic abdominal pain.  Has your drinking ever caused you problems, such as problems with your family, problems with your work (or school) performance, or accidents/injuries?  Do you have a compulsion to drink or a preoccupation with drinking?  Do you have poor control or are you unable to stop drinking once you have started?  Do you have to drink to avoid withdrawal symptoms?  Do you have problems with withdrawal such as tremors, nausea, sweats, or mood disturbances?  Does it take more alcohol than in the past to get you high?  Do you feel a strong urge to drink?  Do you change your plans so that you can have a drink?  Do you ever drink in the morning to relieve the shakes or a hangover? If you have answered a number of the previous questions positively, it may be time for you to talk to your caregivers, family, and friends and see if they think you have a problem. Alcoholism is a Banker  dependency that keeps getting worse and will eventually destroy your health and relationships. Many alcoholics end up dead, impoverished, or in prison. This is often the end result of all chemical dependency.  Do not be discouraged if you are not ready to take action immediately.  Decisions to change behavior often involve up and down desires to change and feeling like you cannot decide.  Try to think more seriously about your drinking  behavior.  Think of the reasons to quit. WHERE TO GO FOR ADDITIONAL INFORMATION   The National Institute on Alcohol Abuse and Alcoholism (NIAAA) BasicStudents.dkwww.niaaa.nih.gov  [employer] Council on Alcoholism and Drug Dependence (NCADD) www.ncadd.org  American Society of Addiction Medicine (ASAM) RoyalDiary.glwww.asam.org  Document Released: 03/10/2005 Document Revised: 06/02/2011 Document Reviewed: 10/27/2007 [facility] Patient Information 2014 [place], [place].  Alcohol Withdrawal Anytime drug use is interfering with normal living activities it has become abuse. This includes problems with family and friends. Psychological dependence has developed when your mind tells you that the drug is needed. This is usually followed by physical dependence when a continuing increase of drugs are required to get the same feeling or "high." This is known as addiction or chemical dependency. A person's risk is much higher if there is a history of chemical dependency in the family. Mild Withdrawal Following Stopping Alcohol, When Addiction or Chemical Dependency Has Developed When a person has developed tolerance to alcohol, any sudden stopping of alcohol can cause uncomfortable physical symptoms. Most of the time these are mild and consist of tremors in the hands and increases in heart rate, breathing, and temperature. Sometimes these symptoms are associated with anxiety, panic attacks, and bad dreams. There may also be stomach upset. Normal sleep patterns are often interrupted with periods of inability to sleep (insomnia). This may last for 6 months. Because of this discomfort, many people choose to continue drinking to get rid of this discomfort and to try to feel normal. Severe Withdrawal with Decreased or No Alcohol Intake, When Addiction or Chemical Dependency Has Developed About five percent of alcoholics will develop signs of severe withdrawal when they stop using alcohol. One sign of this is development of generalized seizures  (convulsions). Other signs of this are severe agitation and confusion. This may be associated with believing in things which are not real or seeing things which are not really there (delusions and hallucinations). Vitamin deficiencies are usually present if alcohol intake has been long-term. Treatment for this most often requires hospitalization and close observation. Addiction can only be helped by stopping use of all chemicals. This is hard but may save your life. With continual alcohol use, possible outcomes are usually loss of self respect and esteem, violence, and death. Addiction cannot be cured but it can be stopped. This often requires outside help and the care of professionals. Treatment centers are listed in the yellow pages under Cocaine, Narcotics, and Alcoholics Anonymous. Most hospitals and clinics can refer you to a specialized care center. It is not necessary for you to go through the uncomfortable symptoms of withdrawal. Your caregiver can provide you with medicines that will help you through this difficult period. Try to avoid situations, friends, or drugs that made it possible for you to keep using alcohol in the past. Learn how to say no. It takes a long period of time to overcome addictions to all drugs, including alcohol. There may be many times when you feel as though you want a drink. After getting rid of the physical addiction and withdrawal, you will  have a lessening of the craving which tells you that you need alcohol to feel normal. Call your caregiver if more support is needed. Learn who to talk to in your family and among your friends so that during these periods you can receive outside help. Alcoholics Anonymous (AA) has helped many people over the years. To get further help, contact AA or call your caregiver, counselor, or clergyperson. Al-Anon and Alateen are support groups for friends and family members of an alcoholic. The people who love and care for an alcoholic often need  help, too. For information about these organizations, check your phone directory or call a local alcoholism treatment center.  SEEK IMMEDIATE MEDICAL CARE IF:   You have a seizure.  You have a fever.  You experience uncontrolled vomiting or you vomit up blood. This may be bright red or look like black coffee grounds.  You have blood in the stool. This may be bright red or appear as a black, tarry, bad-smelling stool.  You become lightheaded or faint. Do not drive if you feel this way. Have someone else drive you or call [phone] for help.  You become more agitated or confused.  You develop uncontrolled anxiety.  You begin to see things that are not really there (hallucinate). Your caregiver has determined that you completely understand your medical condition, and that your mental state is back to normal. You understand that you have been treated for alcohol withdrawal, have agreed not to drink any alcohol for a minimum of 1 day, will not operate a car or other machinery for 24 hours, and have had an opportunity to ask any questions about your condition. Document Released: 12/18/2004 Document Revised: 06/02/2011 Document Reviewed: 10/27/2007 [facility] Patient Information 2014 [place], [place].

## 2013-08-24 NOTE — ED Notes (Signed)
Patient brother Vedder Dickert called. [phone]. Told to try to reach patient after 0900.

## 2013-08-24 NOTE — [facility] Suicide Risk Assessment (Cosign Needed)
Suicide Risk Assessment  Discharge Assessment     Demographic Factors:  Male and Caucasian  Total Time spent with patient: 15 minutes  Psychiatric Specialty Exam:     Blood pressure 108/67, pulse 82, temperature 97.3 F (36.3 C), temperature source Oral, resp. rate 18, SpO2 100.00%.There is no weight on file to calculate BMI.  General Appearance: Casual  Eye Contact::  Good  Speech:  Clear and Coherent and Normal Rate  Volume:  Normal  Mood:  "I'm in good mood; ready to go"  Affect:  Congruent  Thought Process:  Circumstantial  Orientation:  Full (Time, Place, and Person)  Thought Content:  Rumination  Suicidal Thoughts:  No  Homicidal Thoughts:  No  Memory:  Immediate;   Good Recent;   Good Remote;   Good  Judgement:  Intact  Insight:  Present  Psychomotor Activity:  Normal  Concentration:  Fair  Recall:  Good  Fund of Knowledge:Good  Language: Good  Akathisia:  No  Handed:  Right  AIMS (if indicated):     Assets:  Communication Skills Desire for Improvement Housing Social Support  Sleep:       Musculoskeletal: Strength & Muscle Tone: within normal limits Gait & Station: normal Patient leans: N/A   Mental Status Per Nursing Assessment::   On Admission:     Current Mental Status by Physician: Patient denies suicidal/homicidal ideaton, psychosis, and paranoia  Loss Factors: NA  Historical Factors: Family history of mental illness or substance abuse  Risk Reduction Factors:   Sense of responsibility to family, Living with another person, especially a relative and Positive social support  Continued Clinical Symptoms:  Alcohol/Substance Abuse/Dependencies  Cognitive Features That Contribute To Risk:  Closed-mindedness    Suicide Risk:  Minimal: No identifiable suicidal ideation.  Patients presenting with no risk factors but with morbid ruminations; may be classified as minimal risk based on the severity of the depressive symptoms  Discharge  Diagnoses:   AXIS I:  Alcohol Abuse and Substance Abuse AXIS II:  Deferred AXIS III:   Past Medical History  Diagnosis Date  . Alcohol abuse   . Schizophrenia   . Bipolar 1 disorder   . Mental disorder   . Depression   . Gallstones dx'd 08/04/2013  . GERD (gastroesophageal reflux disease)   . Alcohol related seizure ~ 2007    "I've had one"  . Bipolar disorder, unspecified 08/19/2013    History reported  . Tobacco use disorder 08/19/2013   AXIS IV:  other psychosocial or environmental problems AXIS V:  61-70 mild symptoms  Plan Of Care/Follow-up recommendations:  Activity:  Resume usual activity Diet:  Resume usual diet  Is patient on multiple antipsychotic therapies at discharge:  No   Has Patient had three or more failed trials of antipsychotic monotherapy by history:  No  Recommended Plan for Multiple Antipsychotic Therapies: NA    Shuvon Rankin, FNP-BC 08/24/2013, 10:29 AM

## 2013-08-24 NOTE — Consult Note (Signed)
Face to face evaluation and I agree with this note 

## 2013-08-26 ENCOUNTER — Encounter (HOSPITAL_COMMUNITY): Payer: Self-pay | Admitting: Emergency Medicine

## 2013-08-26 ENCOUNTER — Emergency Department (HOSPITAL_COMMUNITY)
Admission: EM | Admit: 2013-08-26 | Discharge: 2013-08-27 | Disposition: A | Discharge status: Home / Self Care | Payer: Self-pay | Attending: Emergency Medicine | Admitting: Emergency Medicine

## 2013-08-26 DIAGNOSIS — Z87442 Personal history of urinary calculi: Secondary | ICD-10-CM | POA: Insufficient documentation

## 2013-08-26 DIAGNOSIS — Z8719 Personal history of other diseases of the digestive system: Secondary | ICD-10-CM | POA: Insufficient documentation

## 2013-08-26 DIAGNOSIS — F17200 Nicotine dependence, unspecified, uncomplicated: Secondary | ICD-10-CM | POA: Insufficient documentation

## 2013-08-26 DIAGNOSIS — F1020 Alcohol dependence, uncomplicated: Secondary | ICD-10-CM | POA: Insufficient documentation

## 2013-08-26 DIAGNOSIS — Z8659 Personal history of other mental and behavioral disorders: Secondary | ICD-10-CM | POA: Insufficient documentation

## 2013-08-26 LAB — CBC
HCT: 34.7 % — ABNORMAL LOW (ref 39.0–52.0)
HEMOGLOBIN: 12.2 g/dL — AB (ref 13.0–17.0)
MCH: 33.2 pg (ref 26.0–34.0)
MCHC: 35.2 g/dL (ref 30.0–36.0)
MCV: 94.6 fL (ref 78.0–100.0)
Platelets: 431 10*3/uL — ABNORMAL HIGH (ref 150–400)
RBC: 3.67 MIL/uL — ABNORMAL LOW (ref 4.22–5.81)
RDW: 12 % (ref 11.5–15.5)
WBC: 7.1 10*3/uL (ref 4.0–10.5)

## 2013-08-26 LAB — COMPREHENSIVE METABOLIC PANEL
ALK PHOS: 60 U/L (ref 39–117)
ALT: 16 U/L (ref 0–53)
AST: 30 U/L (ref 0–37)
Albumin: 3.5 g/dL (ref 3.5–5.2)
BUN: 6 mg/dL (ref 6–23)
CHLORIDE: 95 meq/L — AB (ref 96–112)
CO2: 26 meq/L (ref 19–32)
CREATININE: 0.73 mg/dL (ref 0.50–1.35)
Calcium: 8.5 mg/dL (ref 8.4–10.5)
GLUCOSE: 85 mg/dL (ref 70–99)
POTASSIUM: 3.3 meq/L — AB (ref 3.7–5.3)
Sodium: 134 mEq/L — ABNORMAL LOW (ref 137–147)
Total Protein: 7.4 g/dL (ref 6.0–8.3)

## 2013-08-26 LAB — RAPID URINE DRUG SCREEN, HOSP PERFORMED
Amphetamines: NOT DETECTED
BARBITURATES: NOT DETECTED
BENZODIAZEPINES: NOT DETECTED
Cocaine: NOT DETECTED
Opiates: NOT DETECTED
Tetrahydrocannabinol: NOT DETECTED

## 2013-08-26 LAB — ETHANOL: ALCOHOL ETHYL (B): 206 mg/dL — AB (ref 0–11)

## 2013-08-26 LAB — SALICYLATE LEVEL: Salicylate Lvl: <2 mg/dL — ABNORMAL LOW (ref 2.8–20.0)

## 2013-08-26 LAB — ACETAMINOPHEN LEVEL

## 2013-08-26 MED ORDER — ALUM & MAG HYDROXIDE-SIMETH 200-200-20 MG/5ML PO SUSP: 30.0000 mL | ORAL | Status: DC | PRN

## 2013-08-26 MED ORDER — LORAZEPAM 1 MG PO TABS
0.0000 mg | ORAL_TABLET | Freq: Four times a day (QID) | ORAL | Status: DC
Start: 2013-08-27 — End: 2013-08-27

## 2013-08-26 MED ORDER — THIAMINE HCL 100 MG/ML IJ SOLN: 100.0000 mg | Freq: Every day | INTRAMUSCULAR | Status: DC

## 2013-08-26 MED ORDER — VITAMIN B-1 100 MG PO TABS: 100.0000 mg | ORAL_TABLET | Freq: Every day | ORAL | Status: DC

## 2013-08-26 MED ORDER — LORAZEPAM 1 MG PO TABS: 0.0000 mg | ORAL_TABLET | Freq: Two times a day (BID) | ORAL | Status: DC

## 2013-08-26 MED ORDER — ZOLPIDEM TARTRATE 5 MG PO TABS: 5.0000 mg | ORAL_TABLET | Freq: Every evening | ORAL | Status: DC | PRN

## 2013-08-26 MED ORDER — ONDANSETRON HCL 4 MG PO TABS: 4.0000 mg | ORAL_TABLET | Freq: Three times a day (TID) | ORAL | Status: DC | PRN

## 2013-08-26 NOTE — BH Assessment (Signed)
Received call for assessment. Spoke to [employer], PA-C who said Pt is requesting alcohol detox. Tele-assessment will be initiated.  Harlin Rain Ria Comment, [facility] Triage Specialist [phone]

## 2013-08-26 NOTE — BH Assessment (Signed)
[facility] Assessment Progress Note   Pt was too somnolent to be assessed at this time.  Nurse Boykin Reaper said that patient may have been given ativan before coming to the [facility].  Will attempt to assess later.

## 2013-08-26 NOTE — BH Assessment (Signed)
Attempted tele-assessment but tele-cart would not connect. TTS stationed at [facility] will assess Pt.  Harlin Rain Ria Comment, [facility] Triage Specialist [phone]

## 2013-08-26 NOTE — ED Notes (Signed)
Pt presents POV with request for detox from etoh, pt states he has consumed at least 4 40oz beers today. Pt denies SI.

## 2013-08-26 NOTE — ED Notes (Signed)
Pt has one personal belongings bag as well as a black and yellow bookbag located behind triage nurses desk. Searched and wanted by security.
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
## 2013-08-27 NOTE — [facility] Suicide Risk Assessment (Signed)
Suicide Risk Assessment  Discharge Assessment     Demographic Factors:  Male, Caucasian and Unemployed  Total Time spent with patient: 15 minutes  Psychiatric Specialty Exam:     Blood pressure 102/56, pulse 77, temperature 98.1 F (36.7 C), temperature source Oral, resp. rate 16, SpO2 98.00%.There is no weight on file to calculate BMI.  General Appearance: Disheveled  Eye Solicitor::  Fair  Speech:  Clear and Coherent and Normal Rate  Volume:  Increased  Mood:  Irritable  Affect:  Congruent  Thought Process:  Circumstantial  Orientation:  Full (Time, Place, and Person)  Thought Content:  WDL  Suicidal Thoughts:  No  Homicidal Thoughts:  No  Memory:  Immediate;   Fair  Judgement:  Intact  Insight:  Present  Psychomotor Activity:  Normal  Concentration:  Fair  Recall:  [employer] of Knowledge:Good  Language: Good  Akathisia:  No  Handed:  Right  AIMS (if indicated):     Assets:  Communication Skills  Sleep:       Musculoskeletal: Strength & Muscle Tone: within normal limits Gait & Station: normal Patient leans: N/A   Mental Status Per Nursing Assessment::   On Admission:  Alert & oriented x 3; requesting ETOH detox  Current Mental Status by Physician: NA  Loss Factors: NA  Historical Factors: NA  Risk Reduction Factors:   Sense of responsibility to family and Living with another person, especially a relative  Continued Clinical Symptoms:  Alcohol/Substance Abuse/Dependencies  Cognitive Features That Contribute To Risk:  Closed-mindedness    Suicide Risk:  Minimal: No identifiable suicidal ideation.  Patients presenting with no risk factors but with morbid ruminations; may be classified as minimal risk based on the severity of the depressive symptoms  Discharge Diagnoses:   AXIS I:  Alcohol Abuse AXIS II:  Deferred AXIS III:   Past Medical History  Diagnosis Date  . Alcohol abuse   . Schizophrenia   . Bipolar 1 disorder   . Mental disorder    . Depression   . Gallstones dx'd 08/04/2013  . GERD (gastroesophageal reflux disease)   . Alcohol related seizure ~ 2007    "I've had one"  . Bipolar disorder, unspecified 08/19/2013    History reported  . Tobacco use disorder 08/19/2013   AXIS IV:  other psychosocial or environmental problems AXIS V:  51-60 moderate symptoms  Plan Of Care/Follow-up recommendations:  Activity:  As tolerated Diet:  Regular Tests:  To be determined as needed by PCP Other:  Follow-up as needed with outpatient resources  Is patient on multiple antipsychotic therapies at discharge:  No   Has Patient had three or more failed trials of antipsychotic monotherapy by history:  No  Recommended Plan for Multiple Antipsychotic Therapies: NA    Alberteen Sam, FNP-BC 08/27/2013, 1:10 AM

## 2013-08-27 NOTE — ED Notes (Signed)
Patient came back to desk requesting to leave informed patient that Drenda Freeze, [place] is working on his paper work to be discharge. Patient verbalized understanding and went to room and laid back in bed. Respirations equal and unlabored, skin warm and dry. No acute distress noted.
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
## 2013-08-27 NOTE — ED Notes (Signed)
Patient came to desk after being verbally and physically aggressive with staff stating" I want out of of this place". Writer ask patient if he wanted to be discharge. Patient stated  "I want my stuff so can leave" Drenda Freeze, [place] came to desk to speak with patient regarding him not wanting detox. Patient told Drenda Freeze, [place] "I already detox and I want to go". Drenda Freeze, Pa told patient to go back to his room and until she get his paperwork. Patient verbalized understanding and walked back to his room and pulled covers back over his head.

## 2013-08-27 NOTE — ED Provider Notes (Signed)
Medical screening examination/treatment/procedure(s) were performed by non-physician practitioner and as supervising physician I was immediately available for consultation/collaboration.    Seraphim Affinito, MD 08/27/13 1600 
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
## 2013-08-27 NOTE — ED Notes (Signed)
Patient discharge via ambulatory with a steady gait. Patient denies SI, HI and AH at this time. No acute distress noted.

## 2013-08-27 NOTE — Consult Note (Signed)
Case discussed, and agree with plan 

## 2013-08-31 ENCOUNTER — Ambulatory Visit (HOSPITAL_COMMUNITY)
Admission: RE | Admit: 2013-08-31 | Discharge: 2013-08-31 | Disposition: A | Discharge status: Home / Self Care | Payer: Self-pay | Attending: Psychiatry | Admitting: Psychiatry

## 2013-08-31 ENCOUNTER — Encounter (HOSPITAL_COMMUNITY): Payer: Self-pay | Admitting: Emergency Medicine

## 2013-08-31 ENCOUNTER — Emergency Department (HOSPITAL_COMMUNITY)
Admission: EM | Admit: 2013-08-31 | Discharge: 2013-09-01 | Disposition: A | Discharge status: Home / Self Care | Payer: Self-pay | Attending: Emergency Medicine | Admitting: Emergency Medicine

## 2013-08-31 DIAGNOSIS — F17200 Nicotine dependence, unspecified, uncomplicated: Secondary | ICD-10-CM | POA: Insufficient documentation

## 2013-08-31 DIAGNOSIS — R443 Hallucinations, unspecified: Secondary | ICD-10-CM | POA: Insufficient documentation

## 2013-08-31 DIAGNOSIS — F1020 Alcohol dependence, uncomplicated: Secondary | ICD-10-CM | POA: Diagnosis present

## 2013-08-31 DIAGNOSIS — IMO0002 Reserved for concepts with insufficient information to code with codable children: Secondary | ICD-10-CM | POA: Insufficient documentation

## 2013-08-31 DIAGNOSIS — R45850 Homicidal ideations: Secondary | ICD-10-CM | POA: Insufficient documentation

## 2013-08-31 DIAGNOSIS — F1010 Alcohol abuse, uncomplicated: Secondary | ICD-10-CM | POA: Insufficient documentation

## 2013-08-31 DIAGNOSIS — Z8659 Personal history of other mental and behavioral disorders: Secondary | ICD-10-CM | POA: Insufficient documentation

## 2013-08-31 DIAGNOSIS — F10239 Alcohol dependence with withdrawal, unspecified: Secondary | ICD-10-CM

## 2013-08-31 DIAGNOSIS — Z09 Encounter for follow-up examination after completed treatment for conditions other than malignant neoplasm: Secondary | ICD-10-CM

## 2013-08-31 DIAGNOSIS — Z8719 Personal history of other diseases of the digestive system: Secondary | ICD-10-CM | POA: Insufficient documentation

## 2013-08-31 DIAGNOSIS — F10939 Alcohol use, unspecified with withdrawal, unspecified: Secondary | ICD-10-CM | POA: Diagnosis present

## 2013-08-31 DIAGNOSIS — Z9289 Personal history of other medical treatment: Secondary | ICD-10-CM

## 2013-08-31 LAB — CBC
HEMATOCRIT: 36.9 % — AB (ref 39.0–52.0)
Hemoglobin: 12.6 g/dL — ABNORMAL LOW (ref 13.0–17.0)
MCH: 32.2 pg (ref 26.0–34.0)
MCHC: 34.1 g/dL (ref 30.0–36.0)
MCV: 94.4 fL (ref 78.0–100.0)
Platelets: 422 10*3/uL — ABNORMAL HIGH (ref 150–400)
RBC: 3.91 MIL/uL — ABNORMAL LOW (ref 4.22–5.81)
RDW: 12.3 % (ref 11.5–15.5)
WBC: 6.9 10*3/uL (ref 4.0–10.5)
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
## 2013-08-31 NOTE — ED Notes (Signed)
Pt here voluntarily and states that he's homicidal and sucidal and wants to be admitted to [facility]
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
## 2013-09-01 ENCOUNTER — Encounter (HOSPITAL_COMMUNITY): Payer: Self-pay | Admitting: Registered Nurse

## 2013-09-01 DIAGNOSIS — F329 Major depressive disorder, single episode, unspecified: Secondary | ICD-10-CM

## 2013-09-01 DIAGNOSIS — Z09 Encounter for follow-up examination after completed treatment for conditions other than malignant neoplasm: Secondary | ICD-10-CM

## 2013-09-01 DIAGNOSIS — F1910 Other psychoactive substance abuse, uncomplicated: Secondary | ICD-10-CM

## 2013-09-01 DIAGNOSIS — Z9289 Personal history of other medical treatment: Secondary | ICD-10-CM

## 2013-09-01 DIAGNOSIS — F1994 Other psychoactive substance use, unspecified with psychoactive substance-induced mood disorder: Secondary | ICD-10-CM

## 2013-09-01 DIAGNOSIS — F10939 Alcohol use, unspecified with withdrawal, unspecified: Secondary | ICD-10-CM | POA: Diagnosis present

## 2013-09-01 DIAGNOSIS — F3289 Other specified depressive episodes: Secondary | ICD-10-CM

## 2013-09-01 DIAGNOSIS — F10239 Alcohol dependence with withdrawal, unspecified: Secondary | ICD-10-CM | POA: Diagnosis present

## 2013-09-01 DIAGNOSIS — F1010 Alcohol abuse, uncomplicated: Secondary | ICD-10-CM

## 2013-09-01 LAB — RAPID URINE DRUG SCREEN, HOSP PERFORMED
Amphetamines: NOT DETECTED
BENZODIAZEPINES: NOT DETECTED
Barbiturates: NOT DETECTED
Cocaine: NOT DETECTED
OPIATES: NOT DETECTED
Tetrahydrocannabinol: NOT DETECTED

## 2013-09-01 LAB — COMPREHENSIVE METABOLIC PANEL
ALBUMIN: 3.9 g/dL (ref 3.5–5.2)
ALT: 13 U/L (ref 0–53)
AST: 22 U/L (ref 0–37)
Alkaline Phosphatase: 63 U/L (ref 39–117)
BUN: 8 mg/dL (ref 6–23)
CO2: 26 mEq/L (ref 19–32)
Calcium: 11.1 mg/dL — ABNORMAL HIGH (ref 8.4–10.5)
Chloride: 96 mEq/L (ref 96–112)
Creatinine, Ser: 0.69 mg/dL (ref 0.50–1.35)
GFR calc non Af Amer: >90 mL/min (ref >90)
GLUCOSE: 96 mg/dL (ref 70–99)
Potassium: 3.7 mEq/L (ref 3.7–5.3)
Sodium: 138 mEq/L (ref 137–147)
TOTAL PROTEIN: 8 g/dL (ref 6.0–8.3)
Total Bilirubin: 0.3 mg/dL (ref 0.3–1.2)

## 2013-09-01 LAB — ETHANOL: ALCOHOL ETHYL (B): 35 mg/dL — AB (ref 0–11)

## 2013-09-01 MED ORDER — ONDANSETRON HCL 4 MG PO TABS: 4.0000 mg | ORAL_TABLET | Freq: Three times a day (TID) | ORAL | Status: DC | PRN

## 2013-09-01 MED ORDER — NICOTINE 21 MG/24HR TD PT24
21.0000 mg | MEDICATED_PATCH | Freq: Every day | TRANSDERMAL | Status: DC
  Administered 2013-09-01: 21 mg via TRANSDERMAL

## 2013-09-01 MED ORDER — ALUM & MAG HYDROXIDE-SIMETH 200-200-20 MG/5ML PO SUSP
30.0000 mL | ORAL | Status: DC | PRN
  Administered 2013-09-01 (×2): 30 mL via ORAL
  Filled 2013-09-01 (×3): qty 30

## 2013-09-01 MED ORDER — IBUPROFEN 200 MG PO TABS: 600.0000 mg | ORAL_TABLET | Freq: Three times a day (TID) | ORAL | Status: DC | PRN

## 2013-09-01 MED ORDER — ZOLPIDEM TARTRATE 5 MG PO TABS: 5.0000 mg | ORAL_TABLET | Freq: Every evening | ORAL | Status: DC | PRN

## 2013-09-01 NOTE — Progress Notes (Signed)
Melissa from [facility] called to decline the patient because noncompliance with medication and currently hallucinations.     Maryelizabeth [name], MSW, [place], 09/01/2013 Evening Clinical Social Worker [phone]

## 2013-09-01 NOTE — Consult Note (Signed)
Face to face evaluation and I agree with this note 

## 2013-09-01 NOTE — ED Notes (Signed)
Pt belligerent, cursing at [employer], Olivia Mackie.  [employer] at bedside to speak with pt.  Pending final dispo.

## 2013-09-01 NOTE — BH Assessment (Signed)
[facility] Assessment Progress Note  Pt declined by [facility] due to med non-compliance and hallucinations. Pt now wanting to leave and per RN, EDP will come and evaluate for d/c.

## 2013-09-01 NOTE — Discharge Instructions (Signed)
Alcohol Problems °Most adults who drink alcohol drink in moderation (not a lot) are at low risk for developing problems related to their drinking. However, all drinkers, including low-risk drinkers, should know about the health risks connected with drinking alcohol. °RECOMMENDATIONS FOR LOW-RISK DRINKING  °Drink in moderation. Moderate drinking is defined as follows:  °· Men - no more than 2 drinks per day. °· Nonpregnant women - no more than 1 drink per day. °· Over age 65 - no more than 1 drink per day. °A standard drink is 12 grams of pure alcohol, which is equal to a 12 ounce bottle of beer or wine cooler, a 5 ounce glass of wine, or 1.5 ounces of distilled spirits (such as whiskey, brandy, vodka, or rum).  °ABSTAIN FROM (DO NOT DRINK) ALCOHOL: °· When pregnant or considering pregnancy. °· When taking a medication that interacts with alcohol. °· If you are alcohol dependent. °· A medical condition that prohibits drinking alcohol (such as ulcer, liver disease, or heart disease). °DISCUSS WITH YOUR CAREGIVER: °· If you are at risk for coronary heart disease, discuss the potential benefits and risks of alcohol use: Light to moderate drinking is associated with lower rates of coronary heart disease in certain populations (for example, men over age 45 and postmenopausal women). Infrequent or nondrinkers are advised not to begin light to moderate drinking to reduce the risk of coronary heart disease so as to avoid creating an alcohol-related problem. Similar protective effects can likely be gained through proper diet and exercise. °· Women and the elderly have smaller amounts of body water than men. As a result women and the elderly achieve a higher blood alcohol concentration after drinking the same amount of alcohol. °· Exposing a fetus to alcohol can cause a broad range of birth defects referred to as Fetal Alcohol Syndrome (FAS) or Alcohol-Related Birth Defects (ARBD). Although FAS/ARBD is connected with excessive  alcohol consumption during pregnancy, studies also have reported neurobehavioral problems in infants born to mothers reporting drinking an average of 1 drink per day during pregnancy. °· Heavier drinking (the consumption of more than 4 drinks per occasion by men and more than 3 drinks per occasion by women) impairs learning (cognitive) and psychomotor functions and increases the risk of alcohol-related problems, including accidents and injuries. °CAGE QUESTIONS:  °· Have you ever felt that you should Cut down on your drinking? °· Have people Annoyed you by criticizing your drinking? °· Have you ever felt bad or Guilty about your drinking? °· Have you ever had a drink first thing in the morning to steady your nerves or get rid of a hangover (Eye opener)? °If you answered positively to any of these questions: You may be at risk for alcohol-related problems if alcohol consumption is:  °· Men: Greater than 14 drinks per week or more than 4 drinks per occasion. °· Women: Greater than 7 drinks per week or more than 3 drinks per occasion. °Do you or your family have a medical history of alcohol-related problems, such as: °· Blackouts. °· Sexual dysfunction. °· Depression. °· Trauma. °· Liver dysfunction. °· Sleep disorders. °· Hypertension. °· Chronic abdominal pain. °· Has your drinking ever caused you problems, such as problems with your family, problems with your work (or school) performance, or accidents/injuries? °· Do you have a compulsion to drink or a preoccupation with drinking? °· Do you have poor control or are you unable to stop drinking once you have started? °· Do you have to drink to   avoid withdrawal symptoms? °· Do you have problems with withdrawal such as tremors, nausea, sweats, or mood disturbances? °· Does it take more alcohol than in the past to get you high? °· Do you feel a strong urge to drink? °· Do you change your plans so that you can have a drink? °· Do you ever drink in the morning to relieve  the shakes or a hangover? °If you have answered a number of the previous questions positively, it may be time for you to talk to your caregivers, family, and friends and see if they think you have a problem. Alcoholism is a chemical dependency that keeps getting worse and will eventually destroy your health and relationships. Many alcoholics end up dead, impoverished, or in prison. This is often the end result of all chemical dependency. °· Do not be discouraged if you are not ready to take action immediately. °· Decisions to change behavior often involve up and down desires to change and feeling like you cannot decide. °· Try to think more seriously about your drinking behavior. °· Think of the reasons to quit. °WHERE TO GO FOR ADDITIONAL INFORMATION  °· The National Institute on Alcohol Abuse and Alcoholism (NIAAA) °www.niaaa.nih.gov °· National Council on Alcoholism and Drug Dependence (NCADD) °www.ncadd.org °· American Society of Addiction Medicine (ASAM) °www.asam.org  °Document Released: 03/10/2005 Document Revised: 06/02/2011 Document Reviewed: 10/27/2007 °ExitCare® Patient Information ©2014 [employer]. ° °

## 2013-09-01 NOTE — ED Notes (Signed)
Dr. Walden at bedside to speak with pt. 

## 2013-09-01 NOTE — ED Notes (Signed)
Social worker Engineer, materials at bedside to speak with pt.

## 2013-09-01 NOTE — ED Provider Notes (Signed)
Medical screening examination/treatment/procedure(s) were performed by non-physician practitioner and as supervising physician I was immediately available for consultation/collaboration.   EKG Interpretation None       Jimmy Thebeau K Ganon Demasi-Rasch, MD 09/01/13 [phone]

## 2013-09-01 NOTE — Progress Notes (Signed)
Pt referred to [facility], and [facility].   Byrd Hesselbach [phone]  ED CSW 09/01/2013 1341pm
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
## 2013-09-01 NOTE — ED Notes (Signed)
Denies SI, will continue to monitor for safety. 

## 2013-09-01 NOTE — ED Notes (Signed)
Patient complains of heartburn. Patient denies CP, and SOB at this time. Respirations equal and unlabored, skin warm and dry. No acute distress noted.

## 2013-09-01 NOTE — Progress Notes (Signed)
CSW met with the patient to inquire about the services available to him in the community. Patient reports that he was receiving services with [facility] but [place] to his appointments.  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  He would be able to do a walk in with [facility] and would like to start ACTT services if possible.  Patient became agitated when CSW confirmed he was being discharged tonight and offered shelter resources.  Patient reported he would rather go to jail than a shelter so he requested a phone to call the police.  Nursing staff called the off duty police office back to the unit because of the aggression and the patient inform the officer he had some outstanding warrant and wanted to be arrested.  The patient was discharged.     Chesley Noon, MSW, [place], 09/01/2013 Evening Clinical Social Worker [phone]

## 2013-09-06 NOTE — Consult Note (Signed)
Face to face evaluation and I agree with this note 

## 2013-10-02 ENCOUNTER — Encounter (HOSPITAL_BASED_OUTPATIENT_CLINIC_OR_DEPARTMENT_OTHER): Payer: Self-pay | Admitting: Emergency Medicine

## 2013-10-02 ENCOUNTER — Emergency Department (HOSPITAL_BASED_OUTPATIENT_CLINIC_OR_DEPARTMENT_OTHER)
Admission: EM | Admit: 2013-10-02 | Discharge: 2013-10-03 | Disposition: A | Discharge status: Home / Self Care | Payer: Self-pay | Attending: Emergency Medicine | Admitting: Emergency Medicine

## 2013-10-02 DIAGNOSIS — Z8719 Personal history of other diseases of the digestive system: Secondary | ICD-10-CM | POA: Insufficient documentation

## 2013-10-02 DIAGNOSIS — F17200 Nicotine dependence, unspecified, uncomplicated: Secondary | ICD-10-CM | POA: Insufficient documentation

## 2013-10-02 DIAGNOSIS — R238 Other skin changes: Secondary | ICD-10-CM

## 2013-10-02 DIAGNOSIS — Z8659 Personal history of other mental and behavioral disorders: Secondary | ICD-10-CM | POA: Insufficient documentation

## 2013-10-02 DIAGNOSIS — L988 Other specified disorders of the skin and subcutaneous tissue: Secondary | ICD-10-CM | POA: Insufficient documentation

## 2013-10-02 NOTE — Discharge Instructions (Signed)

## 2013-10-02 NOTE — ED Notes (Signed)
Pt is currently homeless, has been walking around for several weeks without socks, has blisters on numerous toes bilateral.  ETOH on board.
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
## 2013-10-02 NOTE — ED Provider Notes (Signed)
Medical screening examination/treatment/procedure(s) were performed by non-physician practitioner and as supervising physician I was immediately available for consultation/collaboration.   EKG Interpretation None        Glynn OctaveStephen Samy Ryner, MD 10/02/13 2342

## 2013-10-02 NOTE — ED Notes (Signed)
Pt given food, crackers, drink and cheese, pt given bandages and ointment for feet.

## 2013-10-02 NOTE — ED Notes (Signed)
[name] feet are soaking/cleaning.

## 2013-10-03 ENCOUNTER — Encounter (HOSPITAL_COMMUNITY): Payer: Self-pay | Admitting: Emergency Medicine

## 2013-10-03 ENCOUNTER — Emergency Department (HOSPITAL_COMMUNITY)
Admission: EM | Admit: 2013-10-03 | Discharge: 2013-10-04 | Disposition: A | Discharge status: Home / Self Care | Payer: Self-pay | Attending: Emergency Medicine | Admitting: Emergency Medicine

## 2013-10-03 DIAGNOSIS — F3289 Other specified depressive episodes: Secondary | ICD-10-CM | POA: Insufficient documentation

## 2013-10-03 DIAGNOSIS — F17200 Nicotine dependence, unspecified, uncomplicated: Secondary | ICD-10-CM | POA: Insufficient documentation

## 2013-10-03 DIAGNOSIS — F10120 Alcohol abuse with intoxication, uncomplicated: Secondary | ICD-10-CM

## 2013-10-03 DIAGNOSIS — Z8719 Personal history of other diseases of the digestive system: Secondary | ICD-10-CM | POA: Insufficient documentation

## 2013-10-03 DIAGNOSIS — R062 Wheezing: Secondary | ICD-10-CM | POA: Insufficient documentation

## 2013-10-03 DIAGNOSIS — Z8659 Personal history of other mental and behavioral disorders: Secondary | ICD-10-CM | POA: Insufficient documentation

## 2013-10-03 DIAGNOSIS — F1010 Alcohol abuse, uncomplicated: Secondary | ICD-10-CM | POA: Insufficient documentation

## 2013-10-03 DIAGNOSIS — Z8669 Personal history of other diseases of the nervous system and sense organs: Secondary | ICD-10-CM | POA: Insufficient documentation

## 2013-10-03 DIAGNOSIS — F329 Major depressive disorder, single episode, unspecified: Secondary | ICD-10-CM | POA: Insufficient documentation

## 2013-10-03 DIAGNOSIS — F1210 Cannabis abuse, uncomplicated: Secondary | ICD-10-CM | POA: Insufficient documentation

## 2013-10-03 LAB — COMPREHENSIVE METABOLIC PANEL
ALT: 14 U/L (ref 0–53)
AST: 37 U/L (ref 0–37)
Albumin: 3.4 g/dL — ABNORMAL LOW (ref 3.5–5.2)
Alkaline Phosphatase: 81 U/L (ref 39–117)
Anion gap: 15 (ref 5–15)
BUN: 6 mg/dL (ref 6–23)
CO2: 23 mEq/L (ref 19–32)
Calcium: 8.6 mg/dL (ref 8.4–10.5)
Chloride: 99 mEq/L (ref 96–112)
Creatinine, Ser: 0.58 mg/dL (ref 0.50–1.35)
GFR calc Af Amer: >90 mL/min (ref >90)
GFR calc non Af Amer: >90 mL/min (ref >90)
Glucose, Bld: 94 mg/dL (ref 70–99)
Potassium: 3.7 mEq/L (ref 3.7–5.3)
Sodium: 137 mEq/L (ref 137–147)
Total Bilirubin: 0.3 mg/dL (ref 0.3–1.2)
Total Protein: 7.7 g/dL (ref 6.0–8.3)

## 2013-10-03 LAB — CBC WITH DIFFERENTIAL/PLATELET
Basophils Absolute: 0.1 10*3/uL (ref 0.0–0.1)
Basophils Relative: 1 % (ref 0–1)
Eosinophils Absolute: 0.9 10*3/uL — ABNORMAL HIGH (ref 0.0–0.7)
Eosinophils Relative: 13 % — ABNORMAL HIGH (ref 0–5)
HCT: 34.6 % — ABNORMAL LOW (ref 39.0–52.0)
Hemoglobin: 12.7 g/dL — ABNORMAL LOW (ref 13.0–17.0)
Lymphocytes Relative: 42 % (ref 12–46)
Lymphs Abs: 2.9 10*3/uL (ref 0.7–4.0)
MCH: 32.9 pg (ref 26.0–34.0)
MCHC: 36.7 g/dL — ABNORMAL HIGH (ref 30.0–36.0)
MCV: 89.6 fL (ref 78.0–100.0)
Monocytes Absolute: 0.3 10*3/uL (ref 0.1–1.0)
Monocytes Relative: 4 % (ref 3–12)
Neutro Abs: 2.8 10*3/uL (ref 1.7–7.7)
Neutrophils Relative %: 40 % — ABNORMAL LOW (ref 43–77)
Platelets: 244 10*3/uL (ref 150–400)
RBC: 3.86 MIL/uL — ABNORMAL LOW (ref 4.22–5.81)
RDW: 12.5 % (ref 11.5–15.5)
WBC: 7 10*3/uL (ref 4.0–10.5)

## 2013-10-03 LAB — ETHANOL: Alcohol, Ethyl (B): 187 mg/dL — ABNORMAL HIGH (ref 0–11)

## 2013-10-03 MED ORDER — ONDANSETRON HCL 4 MG PO TABS: 4.0000 mg | ORAL_TABLET | Freq: Three times a day (TID) | ORAL | Status: DC | PRN

## 2013-10-03 MED ORDER — LORAZEPAM 1 MG PO TABS
1.0000 mg | ORAL_TABLET | Freq: Three times a day (TID) | ORAL | Status: DC | PRN
  Administered 2013-10-04: 1 mg via ORAL
  Filled 2013-10-03: qty 1

## 2013-10-03 MED ORDER — IBUPROFEN 200 MG PO TABS: 600.0000 mg | ORAL_TABLET | Freq: Three times a day (TID) | ORAL | Status: DC | PRN

## 2013-10-03 MED ORDER — ALUM & MAG HYDROXIDE-SIMETH 200-200-20 MG/5ML PO SUSP: 30.0000 mL | ORAL | Status: DC | PRN

## 2013-10-03 MED ORDER — ZOLPIDEM TARTRATE 5 MG PO TABS: 5.0000 mg | ORAL_TABLET | Freq: Every evening | ORAL | Status: DC | PRN

## 2013-10-03 MED ORDER — ACETAMINOPHEN 325 MG PO TABS: 650.0000 mg | ORAL_TABLET | ORAL | Status: DC | PRN

## 2013-10-03 MED ORDER — NICOTINE 21 MG/24HR TD PT24
21.0000 mg | MEDICATED_PATCH | Freq: Every day | TRANSDERMAL | Status: DC
  Administered 2013-10-04: 21 mg via TRANSDERMAL
  Filled 2013-10-03: qty 1
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
## 2013-10-03 NOTE — ED Notes (Addendum)
Pt states that he wants to detox from alcohol. States that [facility] to [facility] [place]. Denies SI/HI.  Alert, oriented, ambulatory.

## 2013-10-04 ENCOUNTER — Encounter (HOSPITAL_COMMUNITY): Payer: Self-pay | Admitting: Registered Nurse

## 2013-10-04 DIAGNOSIS — F10120 Alcohol abuse with intoxication, uncomplicated: Secondary | ICD-10-CM | POA: Diagnosis present

## 2013-10-04 LAB — RAPID URINE DRUG SCREEN, HOSP PERFORMED
Amphetamines: NOT DETECTED
Barbiturates: NOT DETECTED
Benzodiazepines: NOT DETECTED
Cocaine: NOT DETECTED
Opiates: NOT DETECTED
Tetrahydrocannabinol: NOT DETECTED

## 2013-10-04 MED ORDER — MUPIROCIN CALCIUM 2 % EX CREA
TOPICAL_CREAM | Freq: Once | CUTANEOUS | Status: AC
  Administered 2013-10-04: 1 via TOPICAL
  Filled 2013-10-04: qty 15

## 2013-10-04 NOTE — BH Assessment (Signed)
[facility] Assessment Progress Note     Referred patient to [facility] and [facility] as both had bed availability.  Pt declined treatment.

## 2013-10-04 NOTE — ED Notes (Signed)
Pt discharged home via self; all personal belongings, one bag, returned to patient; all things accounted for. Pt stable, ambulatory at time of discharge; no questions/concerns noted

## 2013-10-04 NOTE — ED Notes (Signed)
Pt brought over via stretcher to tcu. Pt sleeping. No s/s of distress.

## 2013-10-04 NOTE — Discharge Instructions (Signed)
Finding Treatment for Alcohol and Drug Addiction It can be hard to find the right place to get professional treatment. Here are some important things to consider:  There are different types of treatment to choose from.  Some programs are live-in (residential) while others are not (outpatient). Sometimes a combination is offered.  No single type of program is right for everyone.  Most treatment programs involve a combination of education, counseling, and a 12-step, spiritually-based approach.  There are non-spiritually based programs (not 12-step).  Some treatment programs are government sponsored. They are geared for patients without private insurance.  Treatment programs can vary in many respects such as:  Cost and types of insurance accepted.  Types of on-site medical services offered.  Length of stay, setting, and size.  Overall philosophy of treatment. A person may need specialized treatment or have needs not addressed by all programs. For example, adolescents need treatment appropriate for their age. Other people have secondary disorders that must be managed as well. Secondary conditions can include mental illness, such as depression or diabetes. Often, a period of detoxification from alcohol or drugs is needed. This requires medical supervision and not all programs offer this. THINGS TO CONSIDER WHEN SELECTING A TREATMENT PROGRAM   Is the program certified by the appropriate government agency? Even private programs must be certified and employ certified professionals.  Does the program accept your insurance? If not, can a payment plan be set up?  Is the facility clean, organized, and well run? Do they allow you to speak with graduates who can share their treatment experience with you? Can you tour the facility? Can you meet with staff?  Does the program meet the full range of individual needs?  Does the treatment program address sexual orientation and physical disabilities?  Do they provide age, gender, and culturally appropriate treatment services?  Is treatment available in languages other than English?  Is long-term aftercare support or guidance encouraged and provided?  Is assessment of an individual's treatment plan ongoing to ensure it meets changing needs?  Does the program use strategies to encourage reluctant patients to remain in treatment long enough to increase the likelihood of success?  Does the program offer counseling (individual or group) and other behavioral therapies?  Does the program offer medicine as part of the treatment regimen, if needed?  Is there ongoing monitoring of possible relapse? Is there a defined relapse prevention program? Are services or referrals offered to family members to ensure they understand addiction and the recovery process? This would help them support the recovering individual.  Are 12-step meetings held at the center or is transport available for patients to attend outside meetings? In countries outside of the [place], Magazine features editorsee local directories for contact information for services in your area. Document Released: 02/06/2005 Document Revised: 06/02/2011 Document Reviewed: 08/19/2007 [facility] Patient Information 2015 [place], [place]. This information is not intended to replace advice given to you by your health care provider. Make sure you discuss any questions you have with your health care provider.  Alcohol Problems Most adults who drink alcohol drink in moderation (not a lot) are at low risk for developing problems related to their drinking. However, all drinkers, including low-risk drinkers, should know about the health risks connected with drinking alcohol. RECOMMENDATIONS FOR LOW-RISK DRINKING  Drink in moderation. Moderate drinking is defined as follows:   Men - no more than 2 drinks per day.  Nonpregnant women - no more than 1 drink per day.  Over age  65 - no more than 1 drink per day. A standard  drink is 12 grams of pure alcohol, which is equal to a 12 ounce bottle of beer or wine cooler, a 5 ounce glass of wine, or 1.5 ounces of distilled spirits (such as whiskey, brandy, vodka, or rum).  ABSTAIN FROM (DO NOT DRINK) ALCOHOL:  When pregnant or considering pregnancy.  When taking a medication that interacts with alcohol.  If you are alcohol dependent.  A medical condition that prohibits drinking alcohol (such as ulcer, liver disease, or heart disease). DISCUSS WITH YOUR CAREGIVER:  If you are at risk for coronary heart disease, discuss the potential benefits and risks of alcohol use: Light to moderate drinking is associated with lower rates of coronary heart disease in certain populations (for example, men over age 57 and postmenopausal women). Infrequent or nondrinkers are advised not to begin light to moderate drinking to reduce the risk of coronary heart disease so as to avoid creating an alcohol-related problem. Similar protective effects can likely be gained through proper diet and exercise.  Women and the elderly have smaller amounts of body water than men. As a result women and the elderly achieve a higher blood alcohol concentration after drinking the same amount of alcohol.  Exposing a fetus to alcohol can cause a broad range of birth defects referred to as Fetal Alcohol Syndrome (FAS) or Alcohol-Related Birth Defects (ARBD). Although FAS/ARBD is connected with excessive alcohol consumption during pregnancy, studies also have reported neurobehavioral problems in infants born to mothers reporting drinking an average of 1 drink per day during pregnancy.  Heavier drinking (the consumption of more than 4 drinks per occasion by men and more than 3 drinks per occasion by women) impairs learning (cognitive) and psychomotor functions and increases the risk of alcohol-related problems, including accidents and injuries. CAGE QUESTIONS:   Have you ever felt that you should Cut down on your  drinking?  Have people Annoyed you by criticizing your drinking?  Have you ever felt bad or Guilty about your drinking?  Have you ever had a drink first thing in the morning to steady your nerves or get rid of a hangover (Eye opener)? If you answered positively to any of these questions: You may be at risk for alcohol-related problems if alcohol consumption is:   Men: Greater than 14 drinks per week or more than 4 drinks per occasion.  Women: Greater than 7 drinks per week or more than 3 drinks per occasion. Do you or your family have a medical history of alcohol-related problems, such as:  Blackouts.  Sexual dysfunction.  Depression.  Trauma.  Liver dysfunction.  Sleep disorders.  Hypertension.  Chronic abdominal pain.  Has your drinking ever caused you problems, such as problems with your family, problems with your work (or school) performance, or accidents/injuries?  Do you have a compulsion to drink or a preoccupation with drinking?  Do you have poor control or are you unable to stop drinking once you have started?  Do you have to drink to avoid withdrawal symptoms?  Do you have problems with withdrawal such as tremors, nausea, sweats, or mood disturbances?  Does it take more alcohol than in the past to get you high?  Do you feel a strong urge to drink?  Do you change your plans so that you can have a drink?  Do you ever drink in the morning to relieve the shakes or a hangover? If you have answered a number of the previous  questions positively, it may be time for you to talk to your caregivers, family, and friends and see if they think you have a problem. Alcoholism is a chemical dependency that keeps getting worse and will eventually destroy your health and relationships. Many alcoholics end up dead, impoverished, or in prison. This is often the end result of all chemical dependency.  Do not be discouraged if you are not ready to take action  immediately.  Decisions to change behavior often involve up and down desires to change and feeling like you cannot decide.  Try to think more seriously about your drinking behavior.  Think of the reasons to quit. WHERE TO GO FOR ADDITIONAL INFORMATION   The National Institute on Alcohol Abuse and Alcoholism (NIAAA) BasicStudents.dk  [employer] on Alcoholism and Drug Dependence (NCADD) www.ncadd.org  American Society of Addiction Medicine (ASAM) RoyalDiary.gl  Document Released: 03/10/2005 Document Revised: 06/02/2011 Document Reviewed: 10/27/2007 [facility] Patient Information 2015 [place], [place]. This information is not intended to replace advice given to you by your health care provider. Make sure you discuss any questions you have with your health care provider.  Alcohol Intoxication Alcohol intoxication occurs when the amount of alcohol that a person has consumed impairs his or her ability to mentally and physically function. Alcohol directly impairs the normal chemical activity of the brain. Drinking large amounts of alcohol can lead to changes in mental function and behavior, and it can cause many physical effects that can be harmful.  Alcohol intoxication can range in severity from mild to very severe. Various factors can affect the level of intoxication that occurs, such as the person's age, gender, weight, frequency of alcohol consumption, and the presence of other medical conditions (such as diabetes, seizures, or heart conditions). Dangerous levels of alcohol intoxication may occur when people drink large amounts of alcohol in a short period (binge drinking). Alcohol can also be especially dangerous when combined with certain prescription medicines or "recreational" drugs. SIGNS AND SYMPTOMS Some common signs and symptoms of mild alcohol intoxication include:  Loss of coordination.  Changes in mood and behavior.  Impaired judgment.  Slurred speech. As alcohol  intoxication progresses to more severe levels, other signs and symptoms will appear. These may include:  Vomiting.  Confusion and impaired memory.  Slowed breathing.  Seizures.  Loss of consciousness. DIAGNOSIS  Your health care provider will take a medical history and perform a physical exam. You will be asked about the amount and type of alcohol you have consumed. Blood tests will be done to measure the concentration of alcohol in your blood. In many places, your blood alcohol level must be lower than 80 mg/dL (1.61%) to legally drive. However, many dangerous effects of alcohol can occur at much lower levels.  TREATMENT  People with alcohol intoxication often do not require treatment. Most of the effects of alcohol intoxication are temporary, and they go away as the alcohol naturally leaves the body. Your health care provider will monitor your condition until you are stable enough to go home. Fluids are sometimes given through an IV access tube to help prevent dehydration.  HOME CARE INSTRUCTIONS  Do not drive after drinking alcohol.  Stay hydrated. Drink enough water and fluids to keep your urine clear or pale yellow. Avoid caffeine.   Only take over-the-counter or prescription medicines as directed by your health care provider.  SEEK MEDICAL CARE IF:   You have persistent vomiting.   You do not feel better after a few days.  You have frequent alcohol intoxication. Your health care provider can help determine if you should see a substance use treatment counselor. SEEK IMMEDIATE MEDICAL CARE IF:   You become shaky or tremble when you try to stop drinking.   You shake uncontrollably (seizure).   You throw up (vomit) blood. This may be bright red or may look like black coffee grounds.   You have blood in your stool. This may be bright red or may appear as a black, tarry, bad smelling stool.   You become lightheaded or faint.  MAKE SURE YOU:   Understand these  instructions.  Will watch your condition.  Will get help right away if you are not doing well or get worse. Document Released: 12/18/2004 Document Revised: 11/10/2012 Document Reviewed: 08/13/2012 [employer] Patient Information 2015 [place], [place]. This information is not intended to replace advice given to you by your health care provider. Make sure you discuss any questions you have with your health care provider.

## 2013-10-04 NOTE — ED Notes (Signed)
MD at bedside. 

## 2013-10-04 NOTE — Consult Note (Signed)
[facility]iatry Consult   Reason for Consult:  Alcohol abuse and intoxication Referring Physician:  EDP  ARIEH BOGUE is an 58 y.o. male. Total Time spent with patient: 45 minutes  Assessment: AXIS I:  Alcohol Abuse AXIS II:  Deferred AXIS III:   Past Medical History  Diagnosis Date  . Alcohol abuse   . Schizophrenia   . Bipolar 1 disorder   . Mental disorder   . Depression   . Gallstones dx'd 08/04/2013  . GERD (gastroesophageal reflux disease)   . Alcohol related seizure ~ 2007    "I've had one"  . Bipolar disorder, unspecified 08/19/2013    History reported  . Tobacco use disorder 08/19/2013   AXIS IV:  other psychosocial or environmental problems AXIS V:  61-70 mild symptoms  Plan:  No evidence of imminent risk to self or others at present.   Patient does not meet criteria for psychiatric inpatient admission. Supportive therapy provided about ongoing stressors. Discussed crisis plan, support from social network, calling 911, coming to the Emergency Department, and calling Suicide Hotline.  Subjective:   KENWOOD ROSIAK is a 58 y.o. male patient.  HPI:  Patient states "I'm ready to go home.  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    HPI Elements:   Location:  Alcohol abuse. Quality:  intoxication. Severity:  substance induced mood disorder. Timing:  yesterday. Review of Systems  Constitutional: Negative for diaphoresis.  Gastrointestinal: Negative for nausea, vomiting and abdominal pain.  Musculoskeletal: Negative.   Neurological: Negative for tremors and headaches.  Psychiatric/Behavioral: Positive for substance abuse. Negative for depression, suicidal ideas, hallucinations and memory loss. The patient is not nervous/anxious and does not have insomnia.     History reviewed. No pertinent  family history.  Past Psychiatric History: Past Medical History  Diagnosis Date  . Alcohol abuse   . Schizophrenia   . Bipolar 1 disorder   . Mental disorder   . Depression   . Gallstones dx'd 08/04/2013  . GERD (gastroesophageal reflux disease)   . Alcohol related seizure ~ 2007    "I've had one"  . Bipolar disorder, unspecified 08/19/2013    History reported  . Tobacco use disorder 08/19/2013    reports that he has been smoking Cigarettes.  He has a 80 pack-year smoking history. He does not have any smokeless tobacco history on file. He reports that he drinks about 50.4 ounces of alcohol per week. He reports that he uses illicit drugs (Cocaine and Marijuana). History reviewed. No pertinent family history. Family History Substance Abuse: No Family Supports: No Living Arrangements: Other (Comment) (Homeless ) Can pt return to current living arrangement?: Yes Abuse/Neglect [facility]) Physical Abuse: Denies Verbal Abuse: Denies Sexual Abuse: Denies Allergies:  No Known Allergies  ACT Assessment Complete:  Yes:    Educational Status    Risk to Self: Risk to self Suicidal Ideation: No Suicidal Intent: No Is patient at risk for suicide?: No Suicidal Plan?: No Specify Current Suicidal Plan: None  Access to Means: No Specify Access to Suicidal Means: None  What has been your use of drugs/alcohol within the last 12 months?: Abusing: alcohol  Previous Attempts/Gestures: Yes How many times?: 3 Other Self Harm Risks: None  Triggers for Past Attempts: Unpredictable Intentional Self Injurious Behavior: None Family Suicide History: No Recent stressful life event(s): Financial Problems;Other (  Comment) (Homelessness; Chronic SA ) Persecutory voices/beliefs?: No Depression: Yes Depression Symptoms: Loss of interest in usual pleasures;Feeling angry/irritable Substance abuse history and/or treatment for substance abuse?: Yes Suicide prevention information given to non-admitted patients: Not  applicable  Risk to Others: Risk to Others Homicidal Ideation: No Thoughts of Harm to Others: No Comment - Thoughts of Harm to Others: None  Current Homicidal Intent: No Current Homicidal Plan: No Access to Homicidal Means: No Identified Victim: None  History of harm to others?: No Assessment of Violence: In past 6-12 months Violent Behavior Description: Aggressive behavior with staff  Does patient have access to weapons?: No Criminal Charges Pending?: No Does patient have a court date: No  Abuse: Abuse/Neglect Assessment (Assessment to be complete while patient is alone) Physical Abuse: Denies Verbal Abuse: Denies Sexual Abuse: Denies Exploitation of patient/patient's resources: Denies Self-Neglect: Denies  Prior Inpatient Therapy: Prior Inpatient Therapy Prior Inpatient Therapy: Yes Prior Therapy Dates: 2006-2013 Prior Therapy Facilty/Provider(s): [facility], [place], [place], [place]  Reason for Treatment: SA/SI/depression   Prior Outpatient Therapy: Prior Outpatient Therapy Prior Outpatient Therapy: Yes Prior Therapy Dates: Current  Prior Therapy [facility]ilty[facility]Provider([facility]  Reason for Treatment: [facility]   Additional Information: Additional Information 1:1 In Past 12 Months?: No CIRT Risk: No Elopement Risk: No Does patient have medical clearance?: Yes     Objective: Blood pressure 127/76, pulse 88, temperature 98.8 F (37.1 C), temperature source Oral, resp. rate 20, height 5' 8"  (1.727 m), weight 65.772 kg (145 lb), SpO2 98.00%.Body mass index is 22.05 kg/(m^2). Results for orders placed during the hospital encounter of 10/03/13 (from the past 72 hour(s))  CBC WITH DIFFERENTIAL     Status: Abnormal   Collection Time    10/03/13 11:25 PM      Result Value Ref Range   WBC 7.0  4.0 - 10.5 K/uL   RBC 3.86 (*) 4.22 - 5.81 MIL/uL   Hemoglobin 12.7 (*) 13.0 - 17.0 g/dL   HCT 34.6 (*) 39.0 - 52.0 %   MCV 89.6  78.0 - 100.0 fL   MCH 32.9  26.0 - 34.0 pg   MCHC 36.7  (*) 30.0 - 36.0 g/dL   RDW 12.5  11.5 - 15.5 %   Platelets 244  150 - 400 K/uL   Neutrophils Relative % 40 (*) 43 - 77 %   Neutro Abs 2.8  1.7 - 7.7 K/uL   Lymphocytes Relative 42  12 - 46 %   Lymphs Abs 2.9  0.7 - 4.0 K/uL   Monocytes Relative 4  3 - 12 %   Monocytes Absolute 0.3  0.1 - 1.0 K/uL   Eosinophils Relative 13 (*) 0 - 5 %   Eosinophils Absolute 0.9 (*) 0.0 - 0.7 K/uL   Basophils Relative 1  0 - 1 %   Basophils Absolute 0.1  0.0 - 0.1 K/uL  COMPREHENSIVE METABOLIC PANEL     Status: Abnormal   Collection Time    10/03/13 11:25 PM      Result Value Ref Range   Sodium 137  137 - 147 mEq/L   Potassium 3.7  3.7 - 5.3 mEq/L   Chloride 99  96 - 112 mEq/L   CO2 23  19 - 32 mEq/L   Glucose, Bld 94  70 - 99 mg/dL   BUN 6  6 - 23 mg/dL   Creatinine, Ser 0.58  0.50 - 1.35 mg/dL   Calcium 8.6  8.4 - 10.5 mg/dL   Total Protein 7.7  6.0 -  8.3 g/dL   Albumin 3.4 (*) 3.5 - 5.2 g/dL   AST 37  0 - 37 U/L   ALT 14  0 - 53 U/L   Alkaline Phosphatase 81  39 - 117 U/L   Total Bilirubin 0.3  0.3 - 1.2 mg/dL   GFR calc non Af Amer >90  >90 mL/min   GFR calc Af Amer >90  >90 mL/min   Comment: (NOTE)     The eGFR has been calculated using the CKD EPI equation.     This calculation has not been validated in all clinical situations.     eGFR's persistently <90 mL/min signify possible Chronic Kidney     Disease.   Anion gap 15  5 - 15  ETHANOL     Status: Abnormal   Collection Time    10/03/13 11:25 PM      Result Value Ref Range   Alcohol, Ethyl (B) 187 (*) 0 - 11 mg/dL   Comment:            LOWEST DETECTABLE LIMIT FOR     SERUM ALCOHOL IS 11 mg/dL     FOR MEDICAL PURPOSES ONLY  URINE RAPID DRUG SCREEN (HOSP PERFORMED)     Status: None   Collection Time    10/04/13  2:54 AM      Result Value Ref Range   Opiates NONE DETECTED  NONE DETECTED   Cocaine NONE DETECTED  NONE DETECTED   Benzodiazepines NONE DETECTED  NONE DETECTED   Amphetamines NONE DETECTED  NONE DETECTED    Tetrahydrocannabinol NONE DETECTED  NONE DETECTED   Barbiturates NONE DETECTED  NONE DETECTED   Comment:            DRUG SCREEN FOR MEDICAL PURPOSES     ONLY.  IF CONFIRMATION IS NEEDED     FOR ANY PURPOSE, NOTIFY LAB     WITHIN 5 DAYS.                LOWEST DETECTABLE LIMITS     FOR URINE DRUG SCREEN     Drug Class       Cutoff (ng/mL)     Amphetamine      1000     Barbiturate      200     Benzodiazepine   242     Tricyclics       353     Opiates          300     Cocaine          300     THC              50   Labs are reviewed see values above.  Medication reviewed and no changes made  Current Facility-Administered Medications  Medication Dose Route Frequency Provider Last Rate Last Dose  . acetaminophen (TYLENOL) tablet 650 mg  650 mg Oral Q4H PRN Noland Fordyce, PA-C      . alum & mag hydroxide-simeth (MAALOX/MYLANTA) 200-200-20 MG/5ML suspension 30 mL  30 mL Oral PRN Noland Fordyce, PA-C      . ibuprofen (ADVIL,MOTRIN) tablet 600 mg  600 mg Oral Q8H PRN Noland Fordyce, PA-C      . LORazepam (ATIVAN) tablet 1 mg  1 mg Oral Q8H PRN Noland Fordyce, PA-C   1 mg at 10/04/13 0953  . nicotine (NICODERM CQ - dosed in mg/24 hours) patch 21 mg  21 mg Transdermal Daily Noland Fordyce, PA-C   21 mg  at 10/04/13 0953  . ondansetron (ZOFRAN) tablet 4 mg  4 mg Oral Q8H PRN Noland Fordyce, PA-C      . zolpidem (AMBIEN) tablet 5 mg  5 mg Oral QHS PRN Noland Fordyce, PA-C       No current outpatient prescriptions on file.    Psychiatric Specialty Exam:     Blood pressure 127/76, pulse 88, temperature 98.8 F (37.1 C), temperature source Oral, resp. rate 20, height 5' 8"  (1.727 m), weight 65.772 kg (145 lb), SpO2 98.00%.Body mass index is 22.05 kg/(m^2).  General Appearance: Disheveled  Eye Contact::  Good  Speech:  Clear and Coherent and Normal Rate  Volume:  Normal  Mood:  "I'm ready to go"  Affect:  Congruent  Thought Process:  Circumstantial  Orientation:  Full (Time, Place, and Person)   Thought Content:  Rumination  Suicidal Thoughts:  No  Homicidal Thoughts:  No  Memory:  Immediate;   Good Recent;   Good Remote;   Fair  Judgement:  Fair  Insight:  Fair  Psychomotor Activity:  Normal  Concentration:  Fair  Recall:  Good  Fund of Knowledge:Good  Language: Good  Akathisia:  No  Handed:  Right  AIMS (if indicated):     Assets:  Communication Skills  Sleep:      Musculoskeletal: Strength & Muscle Tone: within normal limits Gait & Station: normal Patient leans: N/A  Treatment Plan Summary: Discharge home with substance abuse resources  Earleen Newport, FNP-BC 10/04/2013 1:10 PM  Patient is seen face to face for the psychiatric evaluation, examination, case discussed with physician extender and formulated treatment plan. Reviewed the information documented and agree with the treatment plan.  Lindley Stachnik,JANARDHAHA R. 10/05/2013 9:17 AM

## 2013-10-04 NOTE — ED Notes (Addendum)
Pt wanded via security; belongings at bedside placed in locker 30; pt placed in blue scrubs; two bags to locker
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
## 2013-10-04 NOTE — ED Notes (Signed)
Bed: WA06 Expected date:  Expected time:  Means of arrival:  Comments: 

## 2013-10-04 NOTE — [facility] Suicide Risk Assessment (Cosign Needed)
Suicide Risk Assessment  Discharge Assessment     Demographic Factors:  Male and Caucasian  Total Time spent with patient: 30 minutes  Psychiatric Specialty Exam:      Blood pressure 127/76, pulse 88, temperature 98.8 F (37.1 C), temperature source Oral, resp. rate 20, height 5\' 8"  (1.727 m), weight 65.772 kg (145 lb), SpO2 98.00%.Body mass index is 22.05 kg/(m^2).   General Appearance: Disheveled   Eye Contact:: Good   Speech: Clear and Coherent and Normal Rate   Volume: Normal   Mood: "I'm ready to go"   Affect: Congruent   Thought Process: Circumstantial   Orientation: Full (Time, Place, and Person)   Thought Content: Rumination   Suicidal Thoughts: No   Homicidal Thoughts: No   Memory: Immediate; Good  Recent; Good  Remote; Fair   Judgement: Fair   Insight: Fair   Psychomotor Activity: Normal   Concentration: Fair   Recall: Good   Fund of Knowledge:Good   Language: Good   Akathisia: No   Handed: Right   AIMS (if indicated):   Assets: Communication Skills   Sleep:   Musculoskeletal:  Strength & Muscle Tone: within normal limits  Gait & Station: normal  Patient leans: N/A   Mental Status Per Nursing Assessment::   On Admission:     Current Mental Status by Physician: Patient denies suicidal/homicidal ideation, psychosis, and paranoia  Loss Factors: NA  Historical Factors: NA  Risk Reduction Factors:   Living with another person, especially a relative  Continued Clinical Symptoms:  Alcohol/Substance Abuse/Dependencies  Cognitive Features That Contribute To Risk:  Closed-mindedness    Suicide Risk:  Minimal: No identifiable suicidal ideation.  Patients presenting with no risk factors but with morbid ruminations; may be classified as minimal risk based on the severity of the depressive symptoms  Discharge Diagnoses:  Assessment:  AXIS I: Alcohol Abuse  AXIS II: Deferred  AXIS III:  Past Medical History   Diagnosis  Date   .  Alcohol abuse     .  Schizophrenia    .  Bipolar 1 disorder    .  Mental disorder    .  Depression    .  Gallstones  dx'd 08/04/2013   .  GERD (gastroesophageal reflux disease)    .  Alcohol related seizure  ~ 2007     "I've had one"   .  Bipolar disorder, unspecified  08/19/2013     History reported   .  Tobacco use disorder  08/19/2013    AXIS IV: other psychosocial or environmental problems  AXIS V: 61-70 mild symptoms  Plan: No evidence of imminent risk to self or others at present.  Patient does not meet criteria for psychiatric inpatient admission.  Supportive therapy provided about ongoing stressors.  Discussed crisis plan, support from social network, calling 911, coming to the Emergency Department, and calling Suicide Hotline.   Plan Of Care/Follow-up recommendations:  Activity:  Resume usual activity Diet:  Resume usual diet  Is patient on multiple antipsychotic therapies at discharge:  No   Has Patient had three or more failed trials of antipsychotic monotherapy by history:  No  Recommended Plan for Multiple Antipsychotic Therapies: NA    Tracyann Duffell, FNP-BC 10/04/2013, 1:26 PM

## 2013-10-04 NOTE — ED Provider Notes (Signed)
Medical screening examination/treatment/procedure(s) were performed by non-physician practitioner and as supervising physician I was immediately available for consultation/collaboration.   EKG Interpretation None        Burt Piatek, MD 10/04/13 0440 

## 2013-10-14 ENCOUNTER — Emergency Department (HOSPITAL_COMMUNITY)
Admission: EM | Admit: 2013-10-14 | Discharge: 2013-10-14 | Disposition: A | Discharge status: Home / Self Care | Payer: Self-pay | Attending: Emergency Medicine | Admitting: Emergency Medicine

## 2013-10-14 ENCOUNTER — Encounter (HOSPITAL_COMMUNITY): Payer: Self-pay | Admitting: Emergency Medicine

## 2013-10-14 DIAGNOSIS — L988 Other specified disorders of the skin and subcutaneous tissue: Secondary | ICD-10-CM | POA: Insufficient documentation

## 2013-10-14 DIAGNOSIS — M79672 Pain in left foot: Secondary | ICD-10-CM

## 2013-10-14 DIAGNOSIS — Z8719 Personal history of other diseases of the digestive system: Secondary | ICD-10-CM | POA: Insufficient documentation

## 2013-10-14 DIAGNOSIS — Z5900 Homelessness unspecified: Secondary | ICD-10-CM | POA: Insufficient documentation

## 2013-10-14 DIAGNOSIS — R238 Other skin changes: Secondary | ICD-10-CM

## 2013-10-14 DIAGNOSIS — Z8659 Personal history of other mental and behavioral disorders: Secondary | ICD-10-CM | POA: Insufficient documentation

## 2013-10-14 DIAGNOSIS — M79671 Pain in right foot: Secondary | ICD-10-CM

## 2013-10-14 DIAGNOSIS — F17200 Nicotine dependence, unspecified, uncomplicated: Secondary | ICD-10-CM | POA: Insufficient documentation

## 2013-10-14 DIAGNOSIS — M79609 Pain in unspecified limb: Secondary | ICD-10-CM | POA: Insufficient documentation

## 2013-10-14 NOTE — Discharge Instructions (Signed)
Clean your feet. Wear socks for protection. Keep blisters covered to prevent infection. Follow up with your primary doctor.  Blisters Blisters are fluid-filled sacs that form within the skin. Common causes of blistering are friction, burns, and exposure to irritating chemicals. The fluid in the blister protects the underlying damaged skin. Most of the time it is not recommended that you open blisters. When a blister is opened, there is an increased chance for infection. Usually, a blister will open on its own. They then dry up and peel off within 10 days. If the blister is tense and uncomfortable (painful) the fluid may be drained. If it is drained the roof of the blister should be left intact. The draining should only be done by a medical professional under aseptic conditions. Poorly fitting shoes and boots can cause blisters by being too tight or too loose. Wearing extra socks or using tape, bandages, or pads over the blister-prone area helps prevent the problem by reducing friction. Blisters heal more slowly if you have diabetes or if you have problems with your circulation. You need to be careful about medical follow-up to prevent infection. HOME CARE INSTRUCTIONS  Protect areas where blisters have formed until the skin is healed. Use a special bandage with a hole cut in the middle around the blister. This reduces pressure and friction. When the blister breaks, trim off the loose skin and keep the area clean by washing it with soap daily. Soaking the blister or broken-open blister with diluted vinegar twice daily for 15 minutes will dry it up and speed the healing. Use 3 tablespoons of white vinegar per quart of water (45 mL white vinegar per liter of water). An antibiotic ointment and a bandage can be used to cover the area after soaking.  SEEK MEDICAL CARE IF:   You develop increased redness, pain, swelling, or drainage in the blistered area.  You develop a pus-like discharge from the blistered  area, chills, or a fever. MAKE SURE YOU:   Understand these instructions.  Will watch your condition.  Will get help right away if you are not doing well or get worse. Document Released: 04/17/2004 Document Revised: 06/02/2011 Document Reviewed: 03/15/2008 [facility] Patient Information 2015 [employer] [place]. This information is not intended to replace advice given to you by your health care provider. Make sure you discuss any questions you have with your health care provider.

## 2013-10-14 NOTE — ED Notes (Signed)
Pt arrived to the Ed with a complaint of foot pain.  Pt states he has had bilateral foot pain for two months.   Pt was brought in by EMS.  Pt is intoxicated.  Pt states that he walks so much his feet just hurt.

## 2013-10-14 NOTE — ED Notes (Signed)
[name]/o bilat foot pain x several months per pt "feet are tore up" Pt is intoxicated per EMS
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
## 2013-10-14 NOTE — ED Notes (Signed)
Pt refuses to sign. Given crackers and a drink. Offered a bus ticket and for us to call someone. Refused.

## 2013-10-15 ENCOUNTER — Encounter (HOSPITAL_COMMUNITY): Payer: Self-pay | Admitting: Emergency Medicine

## 2013-10-15 ENCOUNTER — Ambulatory Visit (HOSPITAL_COMMUNITY)
Admission: RE | Admit: 2013-10-15 | Discharge: 2013-10-15 | Disposition: A | Discharge status: Home / Self Care | Payer: Self-pay | Attending: Psychiatry | Admitting: Psychiatry

## 2013-10-15 ENCOUNTER — Emergency Department (HOSPITAL_COMMUNITY)
Admission: EM | Admit: 2013-10-15 | Discharge: 2013-10-15 | Disposition: A | Discharge status: Home / Self Care | Payer: Self-pay | Attending: Emergency Medicine | Admitting: Emergency Medicine

## 2013-10-15 DIAGNOSIS — F1020 Alcohol dependence, uncomplicated: Secondary | ICD-10-CM | POA: Diagnosis present

## 2013-10-15 DIAGNOSIS — F1010 Alcohol abuse, uncomplicated: Secondary | ICD-10-CM | POA: Insufficient documentation

## 2013-10-15 DIAGNOSIS — Z8659 Personal history of other mental and behavioral disorders: Secondary | ICD-10-CM | POA: Insufficient documentation

## 2013-10-15 DIAGNOSIS — Z09 Encounter for follow-up examination after completed treatment for conditions other than malignant neoplasm: Secondary | ICD-10-CM

## 2013-10-15 DIAGNOSIS — Z598 Other problems related to housing and economic circumstances: Secondary | ICD-10-CM | POA: Insufficient documentation

## 2013-10-15 DIAGNOSIS — Z609 Problem related to social environment, unspecified: Secondary | ICD-10-CM | POA: Insufficient documentation

## 2013-10-15 DIAGNOSIS — F3130 Bipolar disorder, current episode depressed, mild or moderate severity, unspecified: Secondary | ICD-10-CM | POA: Insufficient documentation

## 2013-10-15 DIAGNOSIS — F17200 Nicotine dependence, unspecified, uncomplicated: Secondary | ICD-10-CM | POA: Insufficient documentation

## 2013-10-15 DIAGNOSIS — F919 Conduct disorder, unspecified: Secondary | ICD-10-CM | POA: Insufficient documentation

## 2013-10-15 DIAGNOSIS — F10939 Alcohol use, unspecified with withdrawal, unspecified: Secondary | ICD-10-CM | POA: Diagnosis present

## 2013-10-15 DIAGNOSIS — Z5987 Material hardship due to limited financial resources, not elsewhere classified: Secondary | ICD-10-CM | POA: Insufficient documentation

## 2013-10-15 DIAGNOSIS — F319 Bipolar disorder, unspecified: Secondary | ICD-10-CM | POA: Diagnosis present

## 2013-10-15 DIAGNOSIS — R45851 Suicidal ideations: Secondary | ICD-10-CM | POA: Insufficient documentation

## 2013-10-15 DIAGNOSIS — F10120 Alcohol abuse with intoxication, uncomplicated: Secondary | ICD-10-CM | POA: Diagnosis present

## 2013-10-15 DIAGNOSIS — Z9289 Personal history of other medical treatment: Secondary | ICD-10-CM

## 2013-10-15 DIAGNOSIS — IMO0002 Reserved for concepts with insufficient information to code with codable children: Secondary | ICD-10-CM

## 2013-10-15 DIAGNOSIS — K219 Gastro-esophageal reflux disease without esophagitis: Secondary | ICD-10-CM | POA: Insufficient documentation

## 2013-10-15 DIAGNOSIS — R45850 Homicidal ideations: Secondary | ICD-10-CM | POA: Insufficient documentation

## 2013-10-15 DIAGNOSIS — F10239 Alcohol dependence with withdrawal, unspecified: Secondary | ICD-10-CM | POA: Diagnosis present

## 2013-10-15 DIAGNOSIS — Z8719 Personal history of other diseases of the digestive system: Secondary | ICD-10-CM | POA: Insufficient documentation

## 2013-10-15 LAB — CBC
HEMATOCRIT: 33.5 % — AB (ref 39.0–52.0)
Hemoglobin: 11.4 g/dL — ABNORMAL LOW (ref 13.0–17.0)
MCH: 32.2 pg (ref 26.0–34.0)
MCHC: 34 g/dL (ref 30.0–36.0)
MCV: 94.6 fL (ref 78.0–100.0)
Platelets: 262 10*3/uL (ref 150–400)
RBC: 3.54 MIL/uL — ABNORMAL LOW (ref 4.22–5.81)
RDW: 13.6 % (ref 11.5–15.5)
WBC: 6.5 10*3/uL (ref 4.0–10.5)

## 2013-10-15 LAB — COMPREHENSIVE METABOLIC PANEL
ALT: 14 U/L (ref 0–53)
ANION GAP: 14 (ref 5–15)
AST: 28 U/L (ref 0–37)
Albumin: 3.6 g/dL (ref 3.5–5.2)
Alkaline Phosphatase: 81 U/L (ref 39–117)
BUN: 8 mg/dL (ref 6–23)
CHLORIDE: 104 meq/L (ref 96–112)
CO2: 23 mEq/L (ref 19–32)
Calcium: 8.7 mg/dL (ref 8.4–10.5)
Creatinine, Ser: 0.57 mg/dL (ref 0.50–1.35)
GFR calc Af Amer: >90 mL/min (ref >90)
GFR calc non Af Amer: >90 mL/min (ref >90)
Glucose, Bld: 93 mg/dL (ref 70–99)
Potassium: 3.9 mEq/L (ref 3.7–5.3)
Sodium: 141 mEq/L (ref 137–147)
TOTAL PROTEIN: 7.8 g/dL (ref 6.0–8.3)

## 2013-10-15 LAB — RAPID URINE DRUG SCREEN, HOSP PERFORMED
Amphetamines: NOT DETECTED
BARBITURATES: NOT DETECTED
Benzodiazepines: POSITIVE — AB
Cocaine: NOT DETECTED
Opiates: NOT DETECTED
TETRAHYDROCANNABINOL: NOT DETECTED

## 2013-10-15 LAB — ETHANOL: ALCOHOL ETHYL (B): 185 mg/dL — AB (ref 0–11)

## 2013-10-15 LAB — SALICYLATE LEVEL: Salicylate Lvl: <2 mg/dL — ABNORMAL LOW (ref 2.8–20.0)

## 2013-10-15 LAB — ACETAMINOPHEN LEVEL

## 2013-10-15 MED ORDER — CHLORDIAZEPOXIDE HCL 25 MG PO CAPS
25.0000 mg | ORAL_CAPSULE | Freq: Four times a day (QID) | ORAL | Status: DC
Start: 2013-10-15 — End: 2013-10-15
  Administered 2013-10-15: 25 mg via ORAL
  Filled 2013-10-15: qty 1

## 2013-10-15 MED ORDER — ONDANSETRON HCL 4 MG PO TABS: 4.0000 mg | ORAL_TABLET | Freq: Three times a day (TID) | ORAL | Status: DC | PRN

## 2013-10-15 MED ORDER — ZOLPIDEM TARTRATE 5 MG PO TABS: 5.0000 mg | ORAL_TABLET | Freq: Every evening | ORAL | Status: DC | PRN

## 2013-10-15 MED ORDER — LOPERAMIDE HCL 2 MG PO CAPS: 2.0000 mg | ORAL_CAPSULE | ORAL | Status: DC | PRN

## 2013-10-15 MED ORDER — CHLORDIAZEPOXIDE HCL 25 MG PO CAPS: 25.0000 mg | ORAL_CAPSULE | ORAL | Status: DC

## 2013-10-15 MED ORDER — CEPHALEXIN 500 MG PO CAPS: 500.0000 mg | ORAL_CAPSULE | Freq: Four times a day (QID) | ORAL | Status: DC

## 2013-10-15 MED ORDER — LORAZEPAM 1 MG PO TABS: 1.0000 mg | ORAL_TABLET | Freq: Three times a day (TID) | ORAL | Status: DC | PRN

## 2013-10-15 MED ORDER — CHLORDIAZEPOXIDE HCL 25 MG PO CAPS
25.0000 mg | ORAL_CAPSULE | Freq: Three times a day (TID) | ORAL | Status: DC
Start: 2013-10-16 — End: 2013-10-15

## 2013-10-15 MED ORDER — ALUM & MAG HYDROXIDE-SIMETH 200-200-20 MG/5ML PO SUSP: 30.0000 mL | ORAL | Status: DC | PRN

## 2013-10-15 MED ORDER — CHLORDIAZEPOXIDE HCL 25 MG PO CAPS: 25.0000 mg | ORAL_CAPSULE | Freq: Four times a day (QID) | ORAL | Status: DC | PRN

## 2013-10-15 MED ORDER — CEPHALEXIN 500 MG PO CAPS
500.0000 mg | ORAL_CAPSULE | Freq: Four times a day (QID) | ORAL | Status: DC
  Administered 2013-10-15: 500 mg via ORAL
  Filled 2013-10-15: qty 1

## 2013-10-15 MED ORDER — THIAMINE HCL 100 MG/ML IJ SOLN
100.0000 mg | Freq: Once | INTRAMUSCULAR | Status: AC
  Administered 2013-10-15: 100 mg via INTRAMUSCULAR
  Filled 2013-10-15: qty 2

## 2013-10-15 MED ORDER — VITAMIN B-1 100 MG PO TABS
100.0000 mg | ORAL_TABLET | Freq: Every day | ORAL | Status: DC
  Filled 2013-10-15: qty 1

## 2013-10-15 MED ORDER — ONDANSETRON 4 MG PO TBDP: 4.0000 mg | ORAL_TABLET | Freq: Four times a day (QID) | ORAL | Status: DC | PRN

## 2013-10-15 MED ORDER — ADULT MULTIVITAMIN W/MINERALS CH
1.0000 | ORAL_TABLET | Freq: Every day | ORAL | Status: DC
  Administered 2013-10-15: 1 via ORAL
  Filled 2013-10-15: qty 1

## 2013-10-15 MED ORDER — HYDROXYZINE HCL 25 MG PO TABS: 25.0000 mg | ORAL_TABLET | Freq: Four times a day (QID) | ORAL | Status: DC | PRN

## 2013-10-15 MED ORDER — IBUPROFEN 200 MG PO TABS: 600.0000 mg | ORAL_TABLET | Freq: Three times a day (TID) | ORAL | Status: DC | PRN

## 2013-10-15 MED ORDER — NICOTINE 21 MG/24HR TD PT24
21.0000 mg | MEDICATED_PATCH | Freq: Every day | TRANSDERMAL | Status: DC
  Filled 2013-10-15: qty 1

## 2013-10-15 MED ORDER — CHLORDIAZEPOXIDE HCL 25 MG PO CAPS: 50.0000 mg | ORAL_CAPSULE | Freq: Once | ORAL | Status: DC

## 2013-10-15 MED ORDER — CHLORDIAZEPOXIDE HCL 25 MG PO CAPS: 25.0000 mg | ORAL_CAPSULE | Freq: Every day | ORAL | Status: DC
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
## 2013-10-15 NOTE — Consult Note (Signed)
[place] Psychiat[facility] Consult   Reason for Consult:  Alcohol dependence Referring Physician:  EDP-  Jimmy [name] is an 58 y.o. male. Total Time spent with patient: 20 minutes  Assessment: AXIS I:  Alcohol Abuse and Bipolar, Depressed AXIS II:  Deferred AXIS III:   Past Medical History  Diagnosis Date  . Alcohol abuse   . Schizophrenia   . Bipolar 1 disorder   . Mental disorder   . Depression   . Gallstones dx'd 08/04/2013  . GERD (gastroesophageal reflux disease)   . Alcohol related seizure ~ 2007    "I've had one"  . Bipolar disorder, unspecified 08/19/2013    History reported  . Tobacco use disorder 08/19/2013   AXIS IV:  economic problems, housing problems, other psychosocial or environmental problems, problems related to social environment and problems with primary support group AXIS V:  61-70 mild symptoms  Plan:  No evidence of imminent risk to self or others at present.  Dr. Adele Schilder assessed the patient and concurs with the plan.  Subjective:   Jimmy [name] is a 58 y.o. male patient does not warrant admission.  HPI:  The patient came to the ED because of blisters on his feet, medical MD evaluated.  He drinks daily but does not want detox or rehab because he does not plan on stopping his drinking.  Jimmy [name] has been to 3 rehabs in the past but states he goes back to live on the streets where everyone he knows drinks and it is too tempting.  He stopped taking his Latuda because he was told he should not drink and take the medication.  Jimmy [name] has been hearing voices for awhile but they increase while he is drinking.  Denies voices at this time along with suicidal/homicidal ideations. HPI Elements:   Generalized, chronic, constant, years, stressors  Past Psychiatric History: Past Medical History  Diagnosis Date  . Alcohol abuse   . Schizophrenia   . Bipolar 1 disorder   . Mental disorder   . Depression   . Gallstones dx'd 08/04/2013  . GERD (gastroesophageal reflux  disease)   . Alcohol related seizure ~ 2007    "I've had one"  . Bipolar disorder, unspecified 08/19/2013    History reported  . Tobacco use disorder 08/19/2013    reports that he has been smoking Cigarettes.  He has a 80 pack-year smoking history. He does not have any smokeless tobacco history on file. He reports that he drinks about 50.4 ounces of alcohol per week. He reports that he uses illicit drugs (Cocaine and Marijuana). History reviewed. No pertinent family history.         Allergies:  No Known Allergies  ACT Assessment Complete:  Yes:    Educational Status    Risk to Self: Risk to self Is patient at risk for suicide?: Yes Substance abuse history and/or treatment for substance abuse?: Yes  Risk to Others:    Abuse:    Prior Inpatient Therapy:    Prior Outpatient Therapy:    Additional Information:                    Objective: Blood pressure 121/68, pulse 95, temperature 98 F (36.7 C), temperature source Oral, resp. rate 18, SpO2 97.00%.There is no weight on file to calculate BMI. Results for orders placed during the hospital encounter of 10/15/13 (from the past 72 hour(s))  URINE RAPID DRUG SCREEN (HOSP PERFORMED)     Status: Abnormal   Collection Time  10/15/13  2:00 AM      Result Value Ref Range   Opiates NONE DETECTED  NONE DETECTED   Cocaine NONE DETECTED  NONE DETECTED   Benzodiazepines POSITIVE (*) NONE DETECTED   Amphetamines NONE DETECTED  NONE DETECTED   Tetrahydrocannabinol NONE DETECTED  NONE DETECTED   Barbiturates NONE DETECTED  NONE DETECTED   Comment:            DRUG SCREEN FOR MEDICAL PURPOSES     ONLY.  IF CONFIRMATION IS NEEDED     FOR ANY PURPOSE, NOTIFY LAB     WITHIN 5 DAYS.                LOWEST DETECTABLE LIMITS     FOR URINE DRUG SCREEN     Drug Class       Cutoff (ng/mL)     Amphetamine      1000     Barbiturate      200     Benzodiazepine   683     Tricyclics       419     Opiates          300     Cocaine           300     THC              50  ACETAMINOPHEN LEVEL     Status: None   Collection Time    10/15/13  2:07 AM      Result Value Ref Range   Acetaminophen (Tylenol), Serum <15.0  10 - 30 ug/mL   Comment:            THERAPEUTIC CONCENTRATIONS VARY     SIGNIFICANTLY. A RANGE OF 10-30     ug/mL MAY BE AN EFFECTIVE     CONCENTRATION FOR MANY PATIENTS.     HOWEVER, SOME ARE BEST TREATED     AT CONCENTRATIONS OUTSIDE THIS     RANGE.     ACETAMINOPHEN CONCENTRATIONS     >150 ug/mL AT 4 HOURS AFTER     INGESTION AND >50 ug/mL AT 12     HOURS AFTER INGESTION ARE     OFTEN ASSOCIATED WITH TOXIC     REACTIONS.  CBC     Status: Abnormal   Collection Time    10/15/13  2:07 AM      Result Value Ref Range   WBC 6.5  4.0 - 10.5 K/uL   RBC 3.54 (*) 4.22 - 5.81 MIL/uL   Hemoglobin 11.4 (*) 13.0 - 17.0 g/dL   HCT 33.5 (*) 39.0 - 52.0 %   MCV 94.6  78.0 - 100.0 fL   MCH 32.2  26.0 - 34.0 pg   MCHC 34.0  30.0 - 36.0 g/dL   RDW 13.6  11.5 - 15.5 %   Platelets 262  150 - 400 K/uL  COMPREHENSIVE METABOLIC PANEL     Status: Abnormal   Collection Time    10/15/13  2:07 AM      Result Value Ref Range   Sodium 141  137 - 147 mEq/L   Potassium 3.9  3.7 - 5.3 mEq/L   Chloride 104  96 - 112 mEq/L   CO2 23  19 - 32 mEq/L   Glucose, Bld 93  70 - 99 mg/dL   BUN 8  6 - 23 mg/dL   Creatinine, Ser 0.57  0.50 - 1.35 mg/dL   Calcium 8.7  8.4 -  10.5 mg/dL   Total Protein 7.8  6.0 - 8.3 g/dL   Albumin 3.6  3.5 - 5.2 g/dL   AST 28  0 - 37 U/L   ALT 14  0 - 53 U/L   Alkaline Phosphatase 81  39 - 117 U/L   Total Bilirubin <0.2 (*) 0.3 - 1.2 mg/dL   GFR calc non Af Amer >90  >90 mL/min   GFR calc Af Amer >90  >90 mL/min   Comment: (NOTE)     The eGFR has been calculated using the CKD EPI equation.     This calculation has not been validated in all clinical situations.     eGFR's persistently <90 mL/min signify possible Chronic Kidney     Disease.   Anion gap 14  5 - 15  ETHANOL     Status: Abnormal    Collection Time    10/15/13  2:07 AM      Result Value Ref Range   Alcohol, Ethyl (B) 185 (*) 0 - 11 mg/dL   Comment:            LOWEST DETECTABLE LIMIT FOR     SERUM ALCOHOL IS 11 mg/dL     FOR MEDICAL PURPOSES ONLY  SALICYLATE LEVEL     Status: Abnormal   Collection Time    10/15/13  2:07 AM      Result Value Ref Range   Salicylate Lvl <7.0 (*) 2.8 - 20.0 mg/dL   Labs are reviewed and are pertinent for no medical issues.  Current Facility-Administered Medications  Medication Dose Route Frequency Provider Last Rate Last Dose  . alum & mag hydroxide-simeth (MAALOX/MYLANTA) 200-200-20 MG/5ML suspension 30 mL  30 mL Oral PRN Antonietta Breach, PA-C      . cephALEXin (KEFLEX) capsule 500 mg  500 mg Oral 4 times per day Virgel Manifold, MD      . chlordiazePOXIDE (LIBRIUM) capsule 25 mg  25 mg Oral Q6H PRN Lurena Nida, NP      . chlordiazePOXIDE (LIBRIUM) capsule 25 mg  25 mg Oral QID Lurena Nida, NP   25 mg at 10/15/13 1017   Followed by  . [START ON 10/16/2013] chlordiazePOXIDE (LIBRIUM) capsule 25 mg  25 mg Oral TID Lurena Nida, NP       Followed by  . [START ON 10/17/2013] chlordiazePOXIDE (LIBRIUM) capsule 25 mg  25 mg Oral BH-qamhs Lurena Nida, NP       Followed by  . [START ON 10/18/2013] chlordiazePOXIDE (LIBRIUM) capsule 25 mg  25 mg Oral Daily Lurena Nida, NP      . chlordiazePOXIDE (LIBRIUM) capsule 50 mg  50 mg Oral Once Lurena Nida, NP      . hydrOXYzine (ATARAX/VISTARIL) tablet 25 mg  25 mg Oral Q6H PRN Lurena Nida, NP      . ibuprofen (ADVIL,MOTRIN) tablet 600 mg  600 mg Oral Q8H PRN Antonietta Breach, PA-C      . loperamide (IMODIUM) capsule 2-4 mg  2-4 mg Oral PRN Lurena Nida, NP      . multivitamin with minerals tablet 1 tablet  1 tablet Oral Daily Lurena Nida, NP   1 tablet at 10/15/13 1016  . nicotine (NICODERM CQ - dosed in mg/24 hours) patch 21 mg  21 mg Transdermal Daily Antonietta Breach, PA-C      . ondansetron (ZOFRAN) tablet 4 mg  4 mg Oral Q8H PRN Antonietta Breach,  PA-C      .  ondansetron (ZOFRAN-ODT) disintegrating tablet 4 mg  4 mg Oral Q6H PRN Lurena Nida, NP      . Derrill Memo ON 10/16/2013] thiamine (VITAMIN B-1) tablet 100 mg  100 mg Oral Daily Lurena Nida, NP      . zolpidem (AMBIEN) tablet 5 mg  5 mg Oral QHS PRN Antonietta Breach, PA-C       No current outpatient prescriptions on file.    Psychiatric Specialty Exam:     Blood pressure 121/68, pulse 95, temperature 98 F (36.7 C), temperature source Oral, resp. rate 18, SpO2 97.00%.There is no weight on file to calculate BMI.  General Appearance: Disheveled  Eye Contact::  Good  Speech:  Normal Rate  Volume:  Normal  Mood:  Anxious  Affect:  Congruent  Thought Process:  Coherent  Orientation:  Full (Time, Place, and Person)  Thought Content:  WDL  Suicidal Thoughts:  No  Homicidal Thoughts:  No  Memory:  Immediate;   Good Recent;   Good Remote;   Good  Judgement:  Fair  Insight:  Fair  Psychomotor Activity:  Normal  Concentration:  Good  Recall:  Good  Fund of Knowledge:Fair  Language: Fair  Akathisia:  No  Handed:  Right  AIMS (if indicated):     Assets:  Resilience  Sleep:      Musculoskeletal: Strength & Muscle Tone: within normal limits Gait & Station: normal Patient leans: N/A  Treatment Plan Summary: Discharge with follow-up at [facility].  Waylan Boga, [place] 10/15/2013 10:35 AM  I have personally seen the patient and agreed with the findings and involved in the treatment plan. Berniece Andreas, MD

## 2013-10-15 NOTE — ED Notes (Signed)
Eating lunch,nad. Pt is aware that he will be dc'd after eating.

## 2013-10-15 NOTE — ED Notes (Signed)
Up to the bathroom 
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
## 2013-10-15 NOTE — ED Notes (Signed)
Dr arfeen and jamison into see 

## 2013-10-15 NOTE — BH Assessment (Signed)
[facility]. Spoke to Alberteen SamFran Hobson, NP who declined [facility] at [facility] due to [name] aggressive behavior last admission. Pt is currently denying SI or HI per [name], PA-C at [facility]. Pt is [facility] be [facility]. Contacted the following facilities for placement:  [facility]- no beds currently available per Misty StanleyLisa. [facility]- declined per Alinda MoneyMelvin due to [name] history of aggression. [facility]- pt is not eligible because he [place].   Harlin RainFord Ellis Ria CommentWarrick Jr, LPC, [facility] Triage Specialist [phone]

## 2013-10-15 NOTE — ED Notes (Signed)
Pt. In blue scrubs. Pt. And belongings searched and wanded by security. Pt. Has 1 belongings bag. Pt. Has pant, shoes, shirt. Pt. Belongings locked up at the nurses station in triage.

## 2013-10-15 NOTE — [facility] Suicide Risk Assessment (Signed)
Suicide Risk Assessment  Discharge Assessment     Demographic Factors:  Male, Low socioeconomic status, Living alone and Unemployed  Total Time spent with patient: 20 minutes  Psychiatric Specialty Exam:     Blood pressure 121/68, pulse 95, temperature 98 F (36.7 C), temperature source Oral, resp. rate 18, SpO2 97.00%.There is no weight on file to calculate BMI.  General Appearance: Disheveled  Eye Contact::  Good  Speech:  Normal Rate  Volume:  Normal  Mood:  Anxious  Affect:  Congruent  Thought Process:  Coherent  Orientation:  Full (Time, Place, and Person)  Thought Content:  WDL  Suicidal Thoughts:  No  Homicidal Thoughts:  No  Memory:  Immediate;   Good Recent;   Good Remote;   Good  Judgement:  Fair  Insight:  Fair  Psychomotor Activity:  Normal  Concentration:  Good  Recall:  Good  Fund of Knowledge:Fair  Language: Fair  Akathisia:  No  Handed:  Right  AIMS (if indicated):     Assets:  Resilience  Sleep:      Musculoskeletal: Strength & Muscle Tone: within normal limits Gait & Station: normal Patient leans: N/A  Mental Status Per Nursing Assessment::   On Admission:   Blisters on his feet was chief complaint, alcohol abuse  Current Mental Status by Physician: NA  Loss Factors: NA  Historical Factors: NA  Risk Reduction Factors:   Sense of responsibility to family  Continued Clinical Symptoms:  Alcohol abuse  Cognitive Features That Contribute To Risk:  None  Suicide Risk:  Minimal: No identifiable suicidal ideation.  Patients presenting with no risk factors but with morbid ruminations; may be classified as minimal risk based on the severity of the depressive symptoms  Discharge Diagnoses:   AXIS I:  Alcohol Abuse and Bipolar, Depressed AXIS II:  Deferred AXIS III:   Past Medical History  Diagnosis Date  . Alcohol abuse   . Schizophrenia   . Bipolar 1 disorder   . Mental disorder   . Depression   . Gallstones dx'd 08/04/2013  .  GERD (gastroesophageal reflux disease)   . Alcohol related seizure ~ 2007    "I've had one"  . Bipolar disorder, unspecified 08/19/2013    History reported  . Tobacco use disorder 08/19/2013   AXIS IV:  economic problems, housing problems, other psychosocial or environmental problems, problems related to social environment and problems with primary support group AXIS V:  61-70 mild symptoms  Plan Of Care/Follow-up recommendations:  Activity:  as tolerated Diet:  low-sodium heart healthy diet  Is patient on multiple antipsychotic therapies at discharge:  No   Has Patient had three or more failed trials of antipsychotic monotherapy by history:  No  Recommended Plan for Multiple Antipsychotic Therapies: NA    LORD, JAMISON, PMH-NP 10/15/2013, 10:42 AM
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
## 2013-10-15 NOTE — ED Notes (Signed)
Dr kohut into see 

## 2013-10-15 NOTE — ED Notes (Addendum)
Up to the bathroom to shower and change scrubs.  Pt w/ bilateral dressings to feet (iodiform/curlex), open sores noted both great toes, unable to assess further-dressings stuck to areas. Pt to remove dressings when showering. Pungent odor also noted

## 2013-10-15 NOTE — ED Provider Notes (Signed)
Asked by nursing to assess pt's feet. Distal aspects of both feet with macerated appearance over dorsal aspect. Perhaps some faint erythema extending proximally. Pt reports he is homeless. Feet will often stay wet and does not regularly change socks. I think pt primarily needs better foot care. This should improve simply by keeping feet clean and dry. OK to leave open to air or in clean socks while in ED. I don't think special wound care is needed otherwise at this point. Will start on abx for possible developing cellulitis, although not convincing. If discharged will provide with some pairs of socks.   Raeford RazorStephen Kaylamarie Swickard, MD 10/15/13 1004

## 2013-10-15 NOTE — ED Provider Notes (Signed)
Medical screening examination/treatment/procedure(s) were performed by non-physician practitioner and as supervising physician I was immediately available for consultation/collaboration.   EKG Interpretation None       Enid SkeensJoshua M Feige Lowdermilk, MD 10/15/13 [id number]

## 2013-10-15 NOTE — ED Notes (Addendum)
Written dc instructions reviewed w/ pt.  Pt encouraged to take antibiotic's as directed,  watch for s/s of infection,  and keep his feet clean and dry,  Pt verbalized understanding,  additional socks and soap given.  Pt encouraged to follow up w/ OP resources provided and return/seek treatment for suicidal/homicidal thoughts or urges.  Pt ambulatory w/o to difficulty to dc window w/ mHt, belongings returned after leaving the unit.

## 2013-10-15 NOTE — ED Notes (Signed)
Pt watching tv, odor improved, open areas noted top of both great toes. No drainage/redness/swelling noted.

## 2013-10-15 NOTE — ED Notes (Signed)
EDP updated and will see to eval feet

## 2013-10-22 NOTE — ED Provider Notes (Signed)
Medical screening examination/treatment/procedure(s) were conducted as a shared visit with non-physician practitioner(s) and myself.  I personally evaluated the patient during the encounter.   EKG Interpretation None      Pt seen and examined.  History of foot blisters.  Walks a lot.  Drinks a lot.  Exam shows ruptured bullae.  Does not appear cellulitic.  Pt afebrile.  Recommend avoiding walking.  Changing to appropriate footwear. Blisters dressed.  Rolland PorterMark Timotheus Salm, MD 10/22/13 [phone]

## 2013-11-17 ENCOUNTER — Emergency Department (HOSPITAL_BASED_OUTPATIENT_CLINIC_OR_DEPARTMENT_OTHER)
Admission: EM | Admit: 2013-11-17 | Discharge: 2013-11-18 | Disposition: A | Discharge status: Home / Self Care | Payer: Self-pay | Attending: Emergency Medicine | Admitting: Emergency Medicine

## 2013-11-17 ENCOUNTER — Emergency Department (HOSPITAL_BASED_OUTPATIENT_CLINIC_OR_DEPARTMENT_OTHER): Payer: Self-pay

## 2013-11-17 ENCOUNTER — Encounter (HOSPITAL_BASED_OUTPATIENT_CLINIC_OR_DEPARTMENT_OTHER): Payer: Self-pay | Admitting: Emergency Medicine

## 2013-11-17 DIAGNOSIS — M25559 Pain in unspecified hip: Secondary | ICD-10-CM | POA: Insufficient documentation

## 2013-11-17 DIAGNOSIS — Z4800 Encounter for change or removal of nonsurgical wound dressing: Secondary | ICD-10-CM | POA: Insufficient documentation

## 2013-11-17 DIAGNOSIS — Z8659 Personal history of other mental and behavioral disorders: Secondary | ICD-10-CM | POA: Insufficient documentation

## 2013-11-17 DIAGNOSIS — F17200 Nicotine dependence, unspecified, uncomplicated: Secondary | ICD-10-CM | POA: Insufficient documentation

## 2013-11-17 DIAGNOSIS — Z792 Long term (current) use of antibiotics: Secondary | ICD-10-CM | POA: Insufficient documentation

## 2013-11-17 DIAGNOSIS — Z5189 Encounter for other specified aftercare: Secondary | ICD-10-CM

## 2013-11-17 DIAGNOSIS — M25551 Pain in right hip: Secondary | ICD-10-CM

## 2013-11-17 DIAGNOSIS — Z8719 Personal history of other diseases of the digestive system: Secondary | ICD-10-CM | POA: Insufficient documentation

## 2013-11-17 NOTE — ED Notes (Signed)
Pain in his right hip.

## 2013-11-17 NOTE — Discharge Instructions (Signed)
The wound on your right hip looks like it is healing well.  Try to keep feet clean.     Emergency Department Resource Guide 1) Find a Doctor and Pay Out of Pocket Although you won't have to find out who is covered by your insurance plan, it is a good idea to ask around and get recommendations. You will then need to call the office and see if the doctor you have chosen will accept you as a new patient and what types of options they offer for patients who are self-pay. Some doctors offer discounts or will set up payment plans for their patients who do not have insurance, but you will need to ask so you aren't surprised when you get to your appointment.  2) Contact Your Local Health Department Not all health departments have doctors that can see patients for sick visits, but many do, so it is worth a call to see if yours does. If you don't know where your local health department is, you can check in your phone book. The CDC also has a tool to help you locate your state's health department, and many state websites also have listings of all of their local health departments.  3) Find a Walk-in Clinic If your illness is not likely to be very severe or complicated, you may want to try a walk in clinic. These are popping up all over the country in pharmacies, drugstores, and shopping centers. They're usually staffed by nurse practitioners or physician assistants that have been trained to treat common illnesses and complaints. They're usually fairly quick and inexpensive. However, if you have serious medical issues or chronic medical problems, these are probably not your best option.  No Primary Care Doctor: - Call Health Connect at  [phone] - they can help you locate a primary care doctor that  accepts your insurance, provides certain services, etc. - Physician Referral Service- [phone]  Chronic Pain Problems: Organization         Address  Phone   Notes  [facility]  [phone] Patients need to be referred by their primary care doctor.   Medication Assistance: Organization         Address  Phone   Notes  [facility] Med[facility] [address] [place] [place], [place] [address] [phone] --Must be a resident of [facility] -- Must have NO insurance coverage whatsoever (no Medicaid/ Medicare, etc.) -- The pt. MUST have a primary care doctor that directs their care regularly and follows them in the community   MedAssist  [phone]   [employer]  [phone]    Agencies that provide inexpensive medical care: Organization         Address  Phone   Notes  [facility]  [phone]   [facility]    [phone]   [facility] [address] [place], [place] [address] [phone]   [facility] [address] [place] [place] [phone]   Planned Parenthood    [phone]   [facility]    [phone]   [facility]  [address], [place] Phone:  [phone], Fax:  [phone] Hours of Operation:  9 am - 6 pm, M-F.  Also accepts Medicaid/Medicare and self-pay.  [facility]  [address], [place] Phone: [phone], Fax: [phone])  [phone]. Hours of Operation:  8:30 am - 5:30 pm, M-F.  Also accepts Medicaid and self-pay.  [facility] [place] [address], [place] Phone: [phone]   [facility] [address] Natasha Bence [place], [place] [phone], Ext. [phone] Mondays & Thursdays: 7-9 AM.  First 15 patients are seen on a first come, first serve basis.    Medicaid-accepting [facility] Providers:  Organization         Address  Phone   Notes  [facility] [address], [place] [phone] Also accepts self-pay patients.  [facility] [address] Laurell Josephs [place], [place]   [phone]   [facility] [address], [place] [phone]   [facility] [address], [place] [phone]   Renaye Rakers [address], [place]   [phone] Only accepts [place] Access [place] patients after they have their name applied to their card.   Self-Pay (no insurance) in [facility]:  Organization         Address  Phone   Notes  Sickle Cell Patients, [facility] [address] [place], [place] [phone]   [facility] [address] [place], [place] [phone]   [facility] [place]  [address], [place] [phone]   [facility]/Dr. Osei-Bonsu  [address], [place] or [address], [place] [phone] Phone number for both [place] and [place] locations is the same.  [facility] and [facility] [address] [place] [phone]   [facility] [address] [place] or [address] [phone]   [facility] [address], [place] [phone], phone; [phone], fax Sees patients 1st and 3rd Saturday of every month.  Must not qualify for public or private insurance (i.e. Medicaid, Medicare, [place] Health Choice, Veterans' Benefits)  Household income should be no more than 200% of the poverty level The clinic cannot treat you if you are pregnant or think you are pregnant  Sexually transmitted diseases are not treated at the clinic.    Dental Care: Organization         Address  Phone  Notes  [facility] [address] [place], [place] [phone] Accepts children up to age 30 who are enrolled in [place] or [place] Health Choice; pregnant women with a Medicaid card; and children who have applied for Medicaid or [place] Health Choice, but  were declined, whose parents can pay a reduced fee at time of service.  [facility] of [facility]  [address], [place] [phone] Accepts children up to age 5 who are enrolled in [place] or [place] Health Choice; pregnant women with a Medicaid card; and children who have applied for Medicaid or [place] Health Choice, but were declined, whose parents can pay a reduced fee at time of service.  [facility]  [address] [place], [place] [phone] Patients are seen by appointment only. Walk-ins are not accepted. [facility] will see patients 45 years of age and older. Monday - Tuesday (8am-5pm) Most Wednesdays (8:30-5pm) $30 per visit, cash only  [facility] Adult [employer] PRO[facility]  [address], [place] [phone]  Patients are seen by appointment only. Walk-ins are not accepted. [facility] will see patients 93 years of age and older. One Wednesday Evening (Monthly: Volunteer Based).  $30 per visit, cash only  [employer]  [phone] for adults; Children under age 18, call Graduate Pediatric Dentistry at [phone]. Children aged 49-14, please call [phone] to request a pediatric application.  Dental services are provided in all areas of dental care including fillings, crowns and bridges, complete and partial dentures, implants, gum treatment, root canals, and extractions. Preventive care is also provided. Treatment is provided to both adults and children. Patients are selected via a lottery and there is often a waiting list.   [facility] [address], [place]  [phone] [email or web address]   [facility] [address] [place], [place] [phone], Ext. [phone] Second and Fourth Thursday of each month, opens at 6:30 AM; Clinic ends at 9 AM.  Patients are seen on a first-come first-served basis, and a limited number are seen during each clinic.    [facility]  [address] Ether Griffins [place], [place] [phone]   Eligibility Requirements You must have lived in [place], [place], or [place] counties for at least the last three months.   You cannot be eligible for state or federal sponsored [place], including [employer], [place], or [employer].   You generally cannot be eligible for healthcare insurance through your employer.    How to apply: Eligibility screenings are held every Tuesday and Wednesday afternoon from 1:00 pm until 4:00 pm. You do not need an appointment for the interview!  [facility] [address], [place], [place] [phone]   [facility] Health Department  [phone]   [facility] Health Department  [phone]   [facility] Health Department  [phone]    Behavioral Health Resources in the Community: Intensive Outpatient Programs Organization         Address  Phone  Notes  [facility] [address], [place], [place] [phone]   [facility] Outpatient [address] [place], [place] [phone]   [facility] [address], [place], [place]  [phone]   [facility] [address]  [place], [place] [phone] or [phone]   Substance Abuse Resources Organization         Address  Phone  Notes  [facility]  [phone]   [facility]  [phone]   [facility] [place]  [phone]   Floydene Flock  [phone]   Residential & Outpatient Substance Abuse Program  [phone]   Psychological Services Organization         Address  Phone  Notes  [facility]  [phone]   [facility]  [phone]   [facility] [address], [place] [phone] or [phone]    Mobile Crisis Teams Organization         Address  Phone  Notes  [facility], Mobile Crisis Care  Unit  [phone]   [facility]  [address] [place], [place] [phone]   Doristine Locks [address] [place] [phone]    Self-Help/Support Groups Organization         Address  Phone             Notes  [facility]ental [facility] of [facility] - variety of support groups  336- I7437963  Call for more information  Narcotics Anonymous (NA), [facility] [address], [employer]   2 meetings at this location   Residential Sports administrator         Address  Phone  Notes  [facility] [address],    [place]  [phone]   [facility]  [address], [place] [address], [place], [place] [phone]   [facility] [address] [place], [place] [phone] Admissions: 8am-3pm M-F  [facility] [address],    [place], [place] [phone]   [facility] [address] [place], [place], [place] [phone]   [facility] [address]  [place], [place] [phone]   [facility] - Intensive Outpatient [address] Laurell Josephs [address], [place], [place] [phone]   [facility] ([facility] [address] [place]  [place], [place] [phone] or [phone]   [facility] ([facility]) [address] [place], [place] [phone] Accepts Medicaid  [facility] [place] [address],  [place] [phone] Substance Abuse/Addiction Treatment   [facility] Organization         Address  Phone  Notes  [facility]  [phone]   Angie Fava, PhD [address] Ervin Knack [place], [place]   [phone] or [phone]   [facility]   [address] [place], [place] [phone]   [facility] [address] [place], [place] [phone] Insurance/Medicaid/sponsorship through [facility] and Families [address]                                     [place], [place] [phone] Therapy/tele-psych/case  [facility] [address], [place] [phone]    Dr. Lolly Mustache  [phone]   [facility]  [employer] [facility] Dept. 1) [address] [place] 2) [address] [place] 3)  [address], [place] [phone]  [phone]   [facility] Child Abuse Hotline 925-424-1308 or [phone] (After Hours)       Wound Check Your wound appears healthy today. Your wound will heal gradually over time. Eventually a scar will form that will fade with time. FACTORS THAT AFFECT SCAR FORMATION:  People differ in the severity in which they scar.  Scar severity varies according to location, size, and the traits you inherited from your parents (genetic predisposition).  Irritation to the wound from infection, rubbing, or chemical exposure will increase the amount of scar formation. HOME CARE INSTRUCTIONS   If you were given a dressing, you should change it at least once a day or as instructed by your caregiver. If the bandage sticks, soak it off with a solution of hydrogen peroxide.  If the bandage becomes wet, dirty, or develops a bad smell, change it as soon as possible.  Look for signs of infection.  Only take over-the-counter or prescription medicines for pain, discomfort, or fever as directed by your caregiver. SEEK IMMEDIATE MEDICAL CARE IF:   You have redness, swelling, or increasing pain in the wound.  You notice pus coming from the wound.  You have a fever.  You notice a bad smell coming from the wound or dressing. Document Released: 12/15/2003 Document Revised: 06/02/2011 Document Reviewed: 03/10/2005 ExitCare Patient  Information 2015 [place], [place]. This information is not intended to replace advice given to you by your health care provider. Make sure you discuss any questions you have with your health care  provider.
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
## 2013-11-30 ENCOUNTER — Encounter (HOSPITAL_COMMUNITY): Payer: Self-pay | Admitting: Emergency Medicine

## 2013-11-30 ENCOUNTER — Emergency Department (HOSPITAL_COMMUNITY)
Admission: EM | Admit: 2013-11-30 | Discharge: 2013-12-01 | Disposition: A | Discharge status: Other Acute Inpatient Hospital | Payer: Self-pay | Attending: Emergency Medicine | Admitting: Emergency Medicine

## 2013-11-30 DIAGNOSIS — R569 Unspecified convulsions: Secondary | ICD-10-CM | POA: Insufficient documentation

## 2013-11-30 DIAGNOSIS — F10120 Alcohol abuse with intoxication, uncomplicated: Secondary | ICD-10-CM

## 2013-11-30 DIAGNOSIS — F17200 Nicotine dependence, unspecified, uncomplicated: Secondary | ICD-10-CM | POA: Insufficient documentation

## 2013-11-30 DIAGNOSIS — Z008 Encounter for other general examination: Secondary | ICD-10-CM | POA: Insufficient documentation

## 2013-11-30 DIAGNOSIS — Z8719 Personal history of other diseases of the digestive system: Secondary | ICD-10-CM | POA: Insufficient documentation

## 2013-11-30 DIAGNOSIS — F10939 Alcohol use, unspecified with withdrawal, unspecified: Secondary | ICD-10-CM | POA: Insufficient documentation

## 2013-11-30 DIAGNOSIS — F1310 Sedative, hypnotic or anxiolytic abuse, uncomplicated: Secondary | ICD-10-CM | POA: Insufficient documentation

## 2013-11-30 DIAGNOSIS — F1020 Alcohol dependence, uncomplicated: Secondary | ICD-10-CM | POA: Insufficient documentation

## 2013-11-30 DIAGNOSIS — F3289 Other specified depressive episodes: Secondary | ICD-10-CM | POA: Insufficient documentation

## 2013-11-30 DIAGNOSIS — F10239 Alcohol dependence with withdrawal, unspecified: Secondary | ICD-10-CM | POA: Diagnosis present

## 2013-11-30 DIAGNOSIS — F10930 Alcohol use, unspecified with withdrawal, uncomplicated: Secondary | ICD-10-CM

## 2013-11-30 DIAGNOSIS — F10220 Alcohol dependence with intoxication, uncomplicated: Secondary | ICD-10-CM

## 2013-11-30 DIAGNOSIS — F10230 Alcohol dependence with withdrawal, uncomplicated: Secondary | ICD-10-CM

## 2013-11-30 DIAGNOSIS — Z8659 Personal history of other mental and behavioral disorders: Secondary | ICD-10-CM | POA: Insufficient documentation

## 2013-11-30 DIAGNOSIS — F329 Major depressive disorder, single episode, unspecified: Secondary | ICD-10-CM | POA: Insufficient documentation

## 2013-11-30 LAB — COMPREHENSIVE METABOLIC PANEL
ALT: 19 U/L (ref 0–53)
AST: 37 U/L (ref 0–37)
Albumin: 3.5 g/dL (ref 3.5–5.2)
Alkaline Phosphatase: 87 U/L (ref 39–117)
Anion gap: 16 — ABNORMAL HIGH (ref 5–15)
BUN: 10 mg/dL (ref 6–23)
CALCIUM: 9.1 mg/dL (ref 8.4–10.5)
CO2: 23 meq/L (ref 19–32)
CREATININE: 0.62 mg/dL (ref 0.50–1.35)
Chloride: 100 mEq/L (ref 96–112)
GFR calc Af Amer: >90 mL/min (ref >90)
GLUCOSE: 89 mg/dL (ref 70–99)
Potassium: 4.2 mEq/L (ref 3.7–5.3)
Sodium: 139 mEq/L (ref 137–147)
Total Protein: 8 g/dL (ref 6.0–8.3)

## 2013-11-30 LAB — CBC
HCT: 37 % — ABNORMAL LOW (ref 39.0–52.0)
Hemoglobin: 12.7 g/dL — ABNORMAL LOW (ref 13.0–17.0)
MCH: 33.1 pg (ref 26.0–34.0)
MCHC: 34.3 g/dL (ref 30.0–36.0)
MCV: 96.4 fL (ref 78.0–100.0)
Platelets: 305 K/uL (ref 150–400)
RBC: 3.84 MIL/uL — ABNORMAL LOW (ref 4.22–5.81)
RDW: 13.9 % (ref 11.5–15.5)
WBC: 5.8 K/uL (ref 4.0–10.5)

## 2013-11-30 LAB — SALICYLATE LEVEL

## 2013-11-30 LAB — ACETAMINOPHEN LEVEL: Acetaminophen (Tylenol), Serum: <15 ug/mL (ref 10–30)

## 2013-11-30 LAB — ETHANOL: ALCOHOL ETHYL (B): 224 mg/dL — AB (ref 0–11)

## 2013-11-30 NOTE — ED Notes (Signed)
Per ems, patient coming in for ETOH detox. Patient states he is seeking detox to keep his wife from leaving him. Patient has drank 5- 40oz  Beers today.
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
## 2013-12-01 ENCOUNTER — Observation Stay (HOSPITAL_COMMUNITY)
Admission: AD | Admit: 2013-12-01 | Discharge: 2013-12-02 | Disposition: A | Discharge status: Home / Self Care | Payer: Self-pay | Source: Intra-hospital | Attending: Psychiatry | Admitting: Psychiatry

## 2013-12-01 ENCOUNTER — Encounter (HOSPITAL_COMMUNITY): Payer: Self-pay

## 2013-12-01 ENCOUNTER — Encounter (HOSPITAL_COMMUNITY): Payer: Self-pay | Admitting: Registered Nurse

## 2013-12-01 DIAGNOSIS — F1024 Alcohol dependence with alcohol-induced mood disorder: Secondary | ICD-10-CM

## 2013-12-01 DIAGNOSIS — F3130 Bipolar disorder, current episode depressed, mild or moderate severity, unspecified: Secondary | ICD-10-CM

## 2013-12-01 DIAGNOSIS — Z9289 Personal history of other medical treatment: Secondary | ICD-10-CM

## 2013-12-01 DIAGNOSIS — F10120 Alcohol abuse with intoxication, uncomplicated: Secondary | ICD-10-CM

## 2013-12-01 DIAGNOSIS — F1020 Alcohol dependence, uncomplicated: Secondary | ICD-10-CM | POA: Insufficient documentation

## 2013-12-01 DIAGNOSIS — F17200 Nicotine dependence, unspecified, uncomplicated: Secondary | ICD-10-CM | POA: Insufficient documentation

## 2013-12-01 DIAGNOSIS — F1910 Other psychoactive substance abuse, uncomplicated: Secondary | ICD-10-CM

## 2013-12-01 DIAGNOSIS — F1994 Other psychoactive substance use, unspecified with psychoactive substance-induced mood disorder: Secondary | ICD-10-CM

## 2013-12-01 DIAGNOSIS — F10988 Alcohol use, unspecified with other alcohol-induced disorder: Principal | ICD-10-CM | POA: Insufficient documentation

## 2013-12-01 DIAGNOSIS — F1010 Alcohol abuse, uncomplicated: Secondary | ICD-10-CM

## 2013-12-01 DIAGNOSIS — Z09 Encounter for follow-up examination after completed treatment for conditions other than malignant neoplasm: Secondary | ICD-10-CM

## 2013-12-01 DIAGNOSIS — F319 Bipolar disorder, unspecified: Secondary | ICD-10-CM | POA: Insufficient documentation

## 2013-12-01 LAB — RAPID URINE DRUG SCREEN, HOSP PERFORMED
AMPHETAMINES: NOT DETECTED
Barbiturates: NOT DETECTED
Benzodiazepines: POSITIVE — AB
Cocaine: NOT DETECTED
Opiates: NOT DETECTED
Tetrahydrocannabinol: NOT DETECTED

## 2013-12-01 MED ORDER — CHLORDIAZEPOXIDE HCL 25 MG PO CAPS: 25.0000 mg | ORAL_CAPSULE | ORAL | Status: DC

## 2013-12-01 MED ORDER — HYDROXYZINE HCL 25 MG PO TABS: 25.0000 mg | ORAL_TABLET | Freq: Four times a day (QID) | ORAL | Status: DC | PRN

## 2013-12-01 MED ORDER — CHLORDIAZEPOXIDE HCL 25 MG PO CAPS
50.0000 mg | ORAL_CAPSULE | Freq: Once | ORAL | Status: DC
  Administered 2013-12-01: 50 mg via ORAL
  Filled 2013-12-01: qty 2

## 2013-12-01 MED ORDER — CHLORDIAZEPOXIDE HCL 25 MG PO CAPS: 25.0000 mg | ORAL_CAPSULE | Freq: Four times a day (QID) | ORAL | Status: DC

## 2013-12-01 MED ORDER — ONDANSETRON 4 MG PO TBDP: 4.0000 mg | ORAL_TABLET | Freq: Four times a day (QID) | ORAL | Status: DC | PRN

## 2013-12-01 MED ORDER — VITAMIN B-1 100 MG PO TABS
100.0000 mg | ORAL_TABLET | Freq: Every day | ORAL | Status: DC
  Administered 2013-12-02: 100 mg via ORAL
  Filled 2013-12-01 (×3): qty 1

## 2013-12-01 MED ORDER — CHLORDIAZEPOXIDE HCL 25 MG PO CAPS: 25.0000 mg | ORAL_CAPSULE | Freq: Four times a day (QID) | ORAL | Status: DC | PRN

## 2013-12-01 MED ORDER — CHLORDIAZEPOXIDE HCL 25 MG PO CAPS
50.0000 mg | ORAL_CAPSULE | Freq: Once | ORAL | Status: AC
  Administered 2013-12-01: 50 mg via ORAL
  Filled 2013-12-01: qty 2

## 2013-12-01 MED ORDER — LORAZEPAM 1 MG PO TABS: 0.0000 mg | ORAL_TABLET | Freq: Two times a day (BID) | ORAL | Status: DC

## 2013-12-01 MED ORDER — THIAMINE HCL 100 MG/ML IJ SOLN
100.0000 mg | Freq: Every day | INTRAMUSCULAR | Status: DC
Start: 2013-12-01 — End: 2013-12-01

## 2013-12-01 MED ORDER — CHLORDIAZEPOXIDE HCL 25 MG PO CAPS: 25.0000 mg | ORAL_CAPSULE | Freq: Every day | ORAL | Status: DC

## 2013-12-01 MED ORDER — MAGNESIUM HYDROXIDE 400 MG/5ML PO SUSP: 30.0000 mL | Freq: Every day | ORAL | Status: DC | PRN

## 2013-12-01 MED ORDER — LOPERAMIDE HCL 2 MG PO CAPS: 2.0000 mg | ORAL_CAPSULE | ORAL | Status: DC | PRN

## 2013-12-01 MED ORDER — VITAMIN B-1 100 MG PO TABS
100.0000 mg | ORAL_TABLET | Freq: Every day | ORAL | Status: DC
  Administered 2013-12-01: 100 mg via ORAL
  Filled 2013-12-01: qty 1

## 2013-12-01 MED ORDER — CHLORDIAZEPOXIDE HCL 25 MG PO CAPS: 25.0000 mg | ORAL_CAPSULE | Freq: Three times a day (TID) | ORAL | Status: DC

## 2013-12-01 MED ORDER — ALUM & MAG HYDROXIDE-SIMETH 200-200-20 MG/5ML PO SUSP
30.0000 mL | ORAL | Status: DC | PRN
  Administered 2013-12-02: 30 mL via ORAL

## 2013-12-01 MED ORDER — THIAMINE HCL 100 MG/ML IJ SOLN
100.0000 mg | Freq: Every day | INTRAMUSCULAR | Status: DC
Start: 2013-12-02 — End: 2013-12-02

## 2013-12-01 MED ORDER — LORAZEPAM 1 MG PO TABS: 0.0000 mg | ORAL_TABLET | Freq: Four times a day (QID) | ORAL | Status: DC

## 2013-12-01 MED ORDER — ACETAMINOPHEN 325 MG PO TABS: 650.0000 mg | ORAL_TABLET | Freq: Four times a day (QID) | ORAL | Status: DC | PRN

## 2013-12-01 NOTE — Plan of Care (Signed)
[facility] Observation Crisis Plan  Reason for Crisis Plan:  Substance Abuse   Plan of Care:  Referral for Substance Abuse  Family Support:   None   Current Living Environment:  Homeless  Insurance:   Hospital Account   Name Acct ID Class Status Primary Coverage   Jimmy [name], Jimmy [name] [id number] BEHAVIORAL HEALTH OBSERVATION Open None        Guarantor Account (for Hospital Account [id number])   Name Relation to Pt Service Area Active? Acct Type   Jimmy [name] Self [facility] Yes Behavioral Health   Address Phone       homeless [place], [place] [address] [phone](H)          Coverage Information (for Hospital Account [id number])   Not on file      Legal Guardian:     Primary Care Provider:  No PCP Per Patient  Current Outpatient Providers:  Vesta Mixer  Psychiatrist:     Counselor/Therapist:     Compliant with Medications:  Yes  Additional Information:   Farouk Vivero 9/10/201512:34 PM

## 2013-12-01 NOTE — ED Notes (Signed)
[place] transportation called for pickup. 

## 2013-12-01 NOTE — BH Assessment (Signed)
[facility] Assessment Progress Note      Reassessed pt.  Pt denies SI and HI.  Admits to occasional auditory hallucinations.  Pt would like to complete a rehab program with [facility].  Pt was accepted to [facility] observation unit.  Obs consent for treatment signed and faxed to [facility].  Pt signed consent for release of information to fax referral to [facility]. Spoke with Clydie Braun who instructed on how to acquire the eligibility screening form.  Completed form and faxed to [facility].
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
## 2013-12-01 NOTE — Progress Notes (Signed)
Patient in bed watching TV at the beginning of the shift. Mood and affects flat and depressed. Patient reported; "I'm detoxing". He denied any withdrawal symptoms, denied SI/HI and denied Hallucinations. Writer encouraged and supported patient. Q 15 minute check continues as ordered to maintain safety.

## 2013-12-01 NOTE — ED Notes (Signed)
Report given to Fannie Knee at [facility] for obs bed #5.

## 2013-12-01 NOTE — ED Notes (Signed)
Pt sleeping. Will awake when breakfast tray arrives. 

## 2013-12-01 NOTE — ED Notes (Signed)
Pt eating breakfast at this time.  

## 2013-12-01 NOTE — ED Notes (Signed)
Patient is resting comfortably. 

## 2013-12-01 NOTE — Consult Note (Signed)
[facility] Consult   Reason for Consult:  Alcohol abuse and withdrawal Referring Physician:  EDP  Jimmy [name] is an 58 y.o. male. Total Time spent with patient: 30 minutes  Assessment: AXIS I:  Alcohol Abuse, Substance Abuse and Substance Induced Mood Disorder AXIS II:  Deferred AXIS III:   Past Medical History  Diagnosis Date  . Alcohol abuse   . Schizophrenia   . Bipolar 1 disorder   . Mental disorder   . Depression   . Gallstones dx'd 08/04/2013  . GERD (gastroesophageal reflux disease)   . Alcohol related seizure ~ 2007    "I've had one"  . Bipolar disorder, unspecified 08/19/2013    History reported  . Tobacco use disorder 08/19/2013   AXIS IV:  other psychosocial or environmental problems AXIS V:  61-70 mild symptoms  Plan:  24 hour observation  Subjective:   Jimmy [name] is a 58 y.o. male patient admitted with Alcohol Abuse, Substance Abuse and Substance Induced Mood Disorder.  HPI:  Patient states that he wants assistance with detox and referral to rehab services.  Patient relates that he drinks 6-7 forty oz a day.  Patient also relates that he has a history of seizures related to alcohol withdrawal.  Patient denies suicidal/homicidal ideation, psychosis, and paranoia.  Patient stats that he would also like referral to [facility] in [place].   HPI Elements:   Location:  Alcohol abuse. Quality:  alcohol withdrawal. Severity:  mild to modreate. Timing:  1 day. Review of Systems  Respiratory: Negative.   Gastrointestinal: Negative for nausea, vomiting, abdominal pain and constipation.  Musculoskeletal: Negative.   Neurological: Positive for seizures. Negative for loss of consciousness.  Psychiatric/Behavioral: Positive for depression and substance abuse. Negative for suicidal ideas, hallucinations and memory loss. The patient is not nervous/anxious and does not have insomnia.   No family history on file.   Past Psychiatric  History: Past Medical History  Diagnosis Date  . Alcohol abuse   . Schizophrenia   . Bipolar 1 disorder   . Mental disorder   . Depression   . Gallstones dx'd 08/04/2013  . GERD (gastroesophageal reflux disease)   . Alcohol related seizure ~ 2007    "I've had one"  . Bipolar disorder, unspecified 08/19/2013    History reported  . Tobacco use disorder 08/19/2013    reports that he has been smoking Cigarettes.  He has a 80 pack-year smoking history. He does not have any smokeless tobacco history on file. He reports that he drinks about 50.4 ounces of alcohol per week. He reports that he uses illicit drugs (Cocaine and Marijuana). No family history on file. Family History Substance Abuse: No Family Supports: No Living Arrangements: Other (Comment) (Homeless ) Can pt return to current living arrangement?: Yes Abuse/Neglect [facility] [place]) Physical Abuse: Denies Verbal Abuse: Denies Sexual Abuse: Denies Allergies:  No Known Allergies  ACT Assessment Complete:  Yes:    Educational Status    Risk to Self: Risk to self with the past 6 months Suicidal Ideation: No Suicidal Intent: No Is patient at risk for suicide?: No Suicidal Plan?: No Specify Current Suicidal Plan: None Access to Means: No Specify Access to Suicidal Means: None  What has been your use of drugs/alcohol within the last 12 months?: Abusing: alcohol  Previous Attempts/Gestures: Yes How many times?: 3 Other Self Harm Risks: None  Triggers for Past Attempts: Unpredictable Intentional Self Injurious Behavior: None Family Suicide History: No Recent stressful life event(s):  Financial Problems;Other (Comment) (Homelessness; Chronic SA ) Persecutory voices/beliefs?: No Depression: Yes Depression Symptoms: Loss of interest in usual pleasures;Feeling worthless/self pity;Feeling angry/irritable Substance abuse history and/or treatment for substance abuse?: Yes Suicide prevention information given to non-admitted patients: Not  applicable  Risk to Others: Risk to Others within the past 6 months Homicidal Ideation: No Thoughts of Harm to Others: No Comment - Thoughts of Harm to Others: None  Current Homicidal Intent: No Current Homicidal Plan: No Access to Homicidal Means: No Identified Victim: None  History of harm to others?: No Assessment of Violence: None Noted Violent Behavior Description: None  Does patient have access to weapons?: No Criminal Charges Pending?: No Does patient have a court date: No  Abuse: Abuse/Neglect Assessment (Assessment to be complete while patient is alone) Physical Abuse: Denies Verbal Abuse: Denies Sexual Abuse: Denies Exploitation of patient/patient's resources: Denies Self-Neglect: Denies  Prior Inpatient Therapy: Prior Inpatient Therapy Prior Inpatient Therapy: Yes Prior Therapy Dates: 2006-2013 Prior Therapy Facilty/Provider(s): [place], [place], [place], [place]  Reason for Treatment: SA/SI/depression   Prior Outpatient Therapy: Prior Outpatient Therapy Prior Outpatient Therapy: Yes Prior Therapy Dates: Current  Prior Therapy Facilty/Provider(s): [facility]  Reason for Treatment: [facility]   Additional Information: Additional Information 1:1 In Past 12 Months?: No CIRT Risk: No Elopement Risk: No Does patient have medical clearance?: Yes       Objective: Blood pressure 137/77, pulse 78, temperature 98.7 F (37.1 C), temperature source Oral, resp. rate 18, SpO2 95.00%.There is no weight on file to calculate BMI. Results for orders placed during the hospital encounter of 11/30/13 (from the past 72 hour(s))  ACETAMINOPHEN LEVEL     Status: None   Collection Time    11/30/13  8:14 PM      Result Value Ref Range   Acetaminophen (Tylenol), Serum <15.0  10 - 30 ug/mL   Comment:            THERAPEUTIC CONCENTRATIONS VARY     SIGNIFICANTLY. A RANGE OF 10-30     ug/mL MAY BE AN EFFECTIVE     CONCENTRATION FOR MANY PATIENTS.     HOWEVER, SOME ARE BEST TREATED      AT CONCENTRATIONS OUTSIDE THIS     RANGE.     ACETAMINOPHEN CONCENTRATIONS     >150 ug/mL AT 4 HOURS AFTER     INGESTION AND >50 ug/mL AT 12     HOURS AFTER INGESTION ARE     OFTEN ASSOCIATED WITH TOXIC     REACTIONS.  CBC     Status: Abnormal   Collection Time    11/30/13  8:14 PM      Result Value Ref Range   WBC 5.8  4.0 - 10.5 K/uL   RBC 3.84 (*) 4.22 - 5.81 MIL/uL   Hemoglobin 12.7 (*) 13.0 - 17.0 g/dL   HCT 37.0 (*) 39.0 - 52.0 %   MCV 96.4  78.0 - 100.0 fL   MCH 33.1  26.0 - 34.0 pg   MCHC 34.3  30.0 - 36.0 g/dL   RDW 13.9  11.5 - 15.5 %   Platelets 305  150 - 400 K/uL  COMPREHENSIVE METABOLIC PANEL     Status: Abnormal   Collection Time    11/30/13  8:14 PM      Result Value Ref Range   Sodium 139  137 - 147 mEq/L   Potassium 4.2  3.7 - 5.3 mEq/L   Chloride 100  96 - 112 mEq/L   CO2 23  19 - 32 mEq/L   Glucose, Bld 89  70 - 99 mg/dL   BUN 10  6 - 23 mg/dL   Creatinine, Ser 0.62  0.50 - 1.35 mg/dL   Calcium 9.1  8.4 - 10.5 mg/dL   Total Protein 8.0  6.0 - 8.3 g/dL   Albumin 3.5  3.5 - 5.2 g/dL   AST 37  0 - 37 U/L   Comment: SLIGHT HEMOLYSIS     HEMOLYSIS AT THIS LEVEL MAY AFFECT RESULT   ALT 19  0 - 53 U/L   Alkaline Phosphatase 87  39 - 117 U/L   Total Bilirubin <0.2 (*) 0.3 - 1.2 mg/dL   GFR calc non Af Amer >90  >90 mL/min   GFR calc Af Amer >90  >90 mL/min   Comment: (NOTE)     The eGFR has been calculated using the CKD EPI equation.     This calculation has not been validated in all clinical situations.     eGFR's persistently <90 mL/min signify possible Chronic Kidney     Disease.   Anion gap 16 (*) 5 - 15  ETHANOL     Status: Abnormal   Collection Time    11/30/13  8:14 PM      Result Value Ref Range   Alcohol, Ethyl (B) 224 (*) 0 - 11 mg/dL   Comment:            LOWEST DETECTABLE LIMIT FOR     SERUM ALCOHOL IS 11 mg/dL     FOR MEDICAL PURPOSES ONLY  SALICYLATE LEVEL     Status: Abnormal   Collection Time    11/30/13  8:14 PM      Result  Value Ref Range   Salicylate Lvl <9.9 (*) 2.8 - 20.0 mg/dL  URINE RAPID DRUG SCREEN (HOSP PERFORMED)     Status: Abnormal   Collection Time    12/01/13  1:36 AM      Result Value Ref Range   Opiates NONE DETECTED  NONE DETECTED   Cocaine NONE DETECTED  NONE DETECTED   Benzodiazepines POSITIVE (*) NONE DETECTED   Amphetamines NONE DETECTED  NONE DETECTED   Tetrahydrocannabinol NONE DETECTED  NONE DETECTED   Barbiturates NONE DETECTED  NONE DETECTED   Comment:            DRUG SCREEN FOR MEDICAL PURPOSES     ONLY.  IF CONFIRMATION IS NEEDED     FOR ANY PURPOSE, NOTIFY LAB     WITHIN 5 DAYS.                LOWEST DETECTABLE LIMITS     FOR URINE DRUG SCREEN     Drug Class       Cutoff (ng/mL)     Amphetamine      1000     Barbiturate      200     Benzodiazepine   242     Tricyclics       683     Opiates          300     Cocaine          300     THC              50   Labs are reviewed see above values.  Medications reviewed.  Will start Librium for alcohol detox.   Current Facility-Administered Medications  Medication Dose Route Frequency Provider Last Rate Last Dose  . LORazepam (  ATIVAN) tablet 0-4 mg  0-4 mg Oral 4 times per day Brent [facility], PA-C       Followed by  . [START ON 12/03/2013] LORazepam (ATIVAN) tablet 0-4 mg  0-4 mg Oral Q12H Christopher W Lawyer, PA-C      . thiamine (VITAMIN B-1) tablet 100 mg  100 mg Oral Daily Resa Miner Lawyer, PA-C       Or  . thiamine (B-1) injection 100 mg  100 mg Intravenous Daily Resa Miner Lawyer, PA-C       No current outpatient prescriptions on file.    Psychiatric Specialty Exam:     Blood pressure 137/77, pulse 78, temperature 98.7 F (37.1 C), temperature source Oral, resp. rate 18, SpO2 95.00%.There is no weight on file to calculate BMI.  General Appearance: Casual and Disheveled  Eye Contact::  Good  Speech:  Clear and Coherent and Normal Rate  Volume:  Normal  Mood:  Anxious  Affect:  Congruent  Thought  Process:  Circumstantial  Orientation:  Full (Time, Place, and Person)  Thought Content:  Rumination  Suicidal Thoughts:  No  Homicidal Thoughts:  No  Memory:  Immediate;   Good Recent;   Good Remote;   Good  Judgement:  Fair  Insight:  Fair  Psychomotor Activity:  Tremor  Concentration:  Fair  Recall:  Good  Fund of Knowledge:Good  Language: Good  Akathisia:  No  Handed:  Right  AIMS (if indicated):     Assets:  Communication Skills Desire for Improvement  Sleep:      Musculoskeletal: Strength & Muscle Tone: within normal limits Gait & Station: normal Patient leans: N/A  Treatment Plan Summary: 24 hour observation recommended and starting Librium protocol for alcohol detox.  TTS/SW will also need to refer patient to [facility] in [facility].    Patient accepted to [facility] Observation unit.    Rankin, Shuvon, FNP-BC 12/01/2013 10:39 AM
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
## 2013-12-01 NOTE — Consult Note (Signed)
Face to face evaluation and I agree with this note 

## 2013-12-02 NOTE — [facility] Suicide Risk Assessment (Signed)
Suicide Risk Assessment  Discharge Assessment     Demographic Factors:  Male, Caucasian, Low socioeconomic status and Unemployed 58 Y/o male with history of alcohol dependence who came to the OBS bed requesting alcohol detox. He was started on Librium. Detox went uneventfully. Today he feels ready to be D/C. Wants to make it to [facility]. He will not accept an appointment for follow up as he is not committed to abstaining and he was instructed not to drink with medications Total Time spent with patient: 30 minutes  Psychiatric Specialty Exam:     Blood pressure 136/88, pulse 74, temperature 97.4 F (36.3 C), temperature source Oral, resp. rate 16, height  (1.727 m), weight 65.772 kg (145 lb), SpO2 100.00%.Body mass index is 22.05 kg/(m^2).  General Appearance: Fairly Groomed  Patent attorney::  Fair  Speech:  Clear and Coherent  Volume:  Normal  Mood:  worried  Affect:  Appropriate  Thought Process:  Coherent and Goal Directed  Orientation:  Full (Time, Place, and Person)  Thought Content:  plans as he moves on.  Suicidal Thoughts:  No  Homicidal Thoughts:  No  Memory:  Immediate;   Fair Recent;   Fair Remote;   Fair  Judgement:  Fair  Insight:  Present and Shallow  Psychomotor Activity:  Restlessness  Concentration:  Fair  Recall:  [employer] of Knowledge:NA  Language: Fair  Akathisia:  No  Handed:    AIMS (if indicated):     Assets:  Resilience  Sleep:       Musculoskeletal: Strength & Muscle Tone: within normal limits Gait & Station: normal Patient leans: N/A   Mental Status Per Nursing Assessment::   On Admission:  NA  Current Mental Status by Physician: In full contact with reality. There are no active S/S of withdrawal. There are no active SI plans or intent. He is not committed to abstaining from alcohol. Refused an appointment with [facility] " I don't want to lose my time or theirs."   Loss Factors: Financial problems/change in socioeconomic  status  Historical Factors: NA  Risk Reduction Factors:   support from brothers who are also homeless and pan[place]  Continued Clinical Symptoms:  Alcohol/Substance Abuse/Dependencies  Cognitive Features That Contribute To Risk:  Closed-mindedness Polarized thinking Thought constriction (tunnel vision)    Suicide Risk:  Minimal: No identifiable suicidal ideation.  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 Alcohol abuse   . Schizophrenia   . Bipolar 1 disorder   . Mental disorder   . Depression   . Gallstones dx'd 08/04/2013  . GERD (gastroesophageal reflux disease)   . Alcohol related seizure ~ 2007    "I've had one"  . Bipolar disorder, unspecified 08/19/2013    History reported  . Tobacco use disorder 08/19/2013   AXIS IV:  other psychosocial or environmental problems AXIS V:  61-70 mild symptoms  Plan Of Care/Follow-up recommendations:  Activity:  As tolerated Diet:  regular D/C today asked to consider follow up with [facility] Is patient on multiple antipsychotic therapies at discharge:  No   Has Patient had three or more failed trials of antipsychotic monotherapy by history:  No  Recommended Plan for Multiple  Antipsychotic Therapies: NA    Jaela Yepez A 12/02/2013, 10:35 AM

## 2013-12-02 NOTE — Progress Notes (Signed)
Patient ID: [name], male   DOB: June 29, 1955, 58 y.o.   MRN: [id number] Before being discharged states he will go to [facility] in a few days of weeks and get registered, not today. He is familiar with the [facility] and gets his mail thee. He has three brothers that are also homeless in the [place] area too. Pleasant and cooperative and resourceful from years of homelessness.

## 2013-12-02 NOTE — Discharge Instructions (Signed)
To help you maintain a sober lifestyle and to address your need for transitional housing contact [facility].  You will need to go there in person to go through an initial screening process and to be accepted to their wait list.       [facility]      [address]      [place], [place] [address]      [phone]

## 2013-12-02 NOTE — Progress Notes (Signed)
Patient ID: Jimmy [name], male   DOB: November 10, 1955, 58 y.o.   MRN: [id number] Discharge Note-Dr Dub Mikes saw patient this am and has determined he can be discharged. He is homeless and has been for five years. He is interested in a referral to [facility] per client request. 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 He denies any sx of withdrawal and his CIWA this am was 0. All of her property was laundered yesterday and returned to him today. He was given two bus tickets and 2.50 to get to [facility] to complete a screening for services. He was going to [facility] today.

## 2013-12-02 NOTE — Plan of Care (Signed)
[facility] Observation Crisis Plan  Reason for Crisis Plan:  Substance Abuse   Plan of Care:  Referral for Substance Abuse  Family Support:    None  Current Living Environment:  Living Arrangements: Alone; Homeless  Insurance:  Self Pay Hospital Account   Name Acct ID Class Status Primary Coverage   Jimmy [name], Jimmy [name] [id number] BEHAVIORAL HEALTH OBSERVATION Open None        Guarantor Account (for Hospital Account [id number])   Name Relation to Pt Service Area Active? Acct Type   Jimmy [name] Self [facility] Yes Behavioral Health   Address Phone       homeless [place], [place] [address] [phone](H)          Coverage Information (for Hospital Account [id number])   Not on file      Legal Guardian:   Self  Primary Care Provider:  No PCP Per Patient  Current Outpatient Providers:  [facility] (most recent)  Psychiatrist:   [facility]  Counselor/Therapist:   [facility]  Compliant with Medications:  Yes; pt is not currently on any medications  Additional Information: After consulting with Geoffery Lyons, MD it has been determined that pt does not present a life threatening danger to himself or others, and that psychiatric hospitalization is not indicated for him at this time.  Pt wants to be discharged and intends to follow up with [facility] in [facility] for substance abuse treatment and transitional housing.  Contact information will be included in his discharge instructions.  Pt has also requested a bus pass, which will be provided, so that he can go to [facility] to initiate their admission process.  [name], MA Triage Specialist Raphael Gibney 9/11/201510:35 AM

## 2013-12-04 ENCOUNTER — Emergency Department (HOSPITAL_COMMUNITY)
Admission: EM | Admit: 2013-12-04 | Discharge: 2013-12-05 | Disposition: A | Discharge status: Home / Self Care | Payer: Self-pay | Attending: Emergency Medicine | Admitting: Emergency Medicine

## 2013-12-04 ENCOUNTER — Emergency Department (HOSPITAL_COMMUNITY): Payer: Self-pay

## 2013-12-04 ENCOUNTER — Encounter (HOSPITAL_COMMUNITY): Payer: Self-pay | Admitting: Emergency Medicine

## 2013-12-04 DIAGNOSIS — Z8659 Personal history of other mental and behavioral disorders: Secondary | ICD-10-CM | POA: Insufficient documentation

## 2013-12-04 DIAGNOSIS — Z8719 Personal history of other diseases of the digestive system: Secondary | ICD-10-CM | POA: Insufficient documentation

## 2013-12-04 DIAGNOSIS — F1010 Alcohol abuse, uncomplicated: Secondary | ICD-10-CM | POA: Insufficient documentation

## 2013-12-04 DIAGNOSIS — R079 Chest pain, unspecified: Secondary | ICD-10-CM | POA: Insufficient documentation

## 2013-12-04 DIAGNOSIS — F17200 Nicotine dependence, unspecified, uncomplicated: Secondary | ICD-10-CM | POA: Insufficient documentation

## 2013-12-04 DIAGNOSIS — F1310 Sedative, hypnotic or anxiolytic abuse, uncomplicated: Secondary | ICD-10-CM | POA: Insufficient documentation

## 2013-12-04 LAB — CBC
HCT: 39.6 % (ref 39.0–52.0)
Hemoglobin: 13.7 g/dL (ref 13.0–17.0)
MCH: 33.3 pg (ref 26.0–34.0)
MCHC: 34.6 g/dL (ref 30.0–36.0)
MCV: 96.1 fL (ref 78.0–100.0)
PLATELETS: 300 10*3/uL (ref 150–400)
RBC: 4.12 MIL/uL — ABNORMAL LOW (ref 4.22–5.81)
RDW: 13.5 % (ref 11.5–15.5)
WBC: 8.2 10*3/uL (ref 4.0–10.5)

## 2013-12-04 LAB — COMPREHENSIVE METABOLIC PANEL
ALT: 14 U/L (ref 0–53)
AST: 26 U/L (ref 0–37)
Albumin: 3.8 g/dL (ref 3.5–5.2)
Alkaline Phosphatase: 81 U/L (ref 39–117)
Anion gap: 13 (ref 5–15)
BUN: 7 mg/dL (ref 6–23)
CALCIUM: 9.1 mg/dL (ref 8.4–10.5)
CO2: 27 mEq/L (ref 19–32)
CREATININE: 0.59 mg/dL (ref 0.50–1.35)
Chloride: 103 mEq/L (ref 96–112)
GFR calc Af Amer: >90 mL/min (ref >90)
GFR calc non Af Amer: >90 mL/min (ref >90)
Glucose, Bld: 84 mg/dL (ref 70–99)
Potassium: 3.9 mEq/L (ref 3.7–5.3)
SODIUM: 143 meq/L (ref 137–147)
TOTAL PROTEIN: 8.3 g/dL (ref 6.0–8.3)
Total Bilirubin: <0.2 mg/dL — ABNORMAL LOW (ref 0.3–1.2)

## 2013-12-04 LAB — SALICYLATE LEVEL

## 2013-12-04 LAB — RAPID URINE DRUG SCREEN, HOSP PERFORMED
Amphetamines: NOT DETECTED
BENZODIAZEPINES: POSITIVE — AB
Barbiturates: NOT DETECTED
Cocaine: NOT DETECTED
OPIATES: NOT DETECTED
Tetrahydrocannabinol: NOT DETECTED

## 2013-12-04 LAB — ACETAMINOPHEN LEVEL: Acetaminophen (Tylenol), Serum: <15 ug/mL (ref 10–30)

## 2013-12-04 LAB — I-STAT TROPONIN, ED: Troponin i, poc: 0 ng/mL (ref 0.00–0.08)

## 2013-12-04 LAB — ETHANOL: Alcohol, Ethyl (B): 342 mg/dL — ABNORMAL HIGH (ref 0–11)

## 2013-12-04 MED ORDER — ONDANSETRON HCL 4 MG PO TABS: 4.0000 mg | ORAL_TABLET | Freq: Three times a day (TID) | ORAL | Status: DC | PRN

## 2013-12-04 MED ORDER — LORAZEPAM 1 MG PO TABS: 0.0000 mg | ORAL_TABLET | Freq: Four times a day (QID) | ORAL | Status: DC

## 2013-12-04 MED ORDER — ZOLPIDEM TARTRATE 5 MG PO TABS: 5.0000 mg | ORAL_TABLET | Freq: Every evening | ORAL | Status: DC | PRN

## 2013-12-04 MED ORDER — VITAMIN B-1 100 MG PO TABS: 100.0000 mg | ORAL_TABLET | Freq: Every day | ORAL | Status: DC

## 2013-12-04 MED ORDER — THIAMINE HCL 100 MG/ML IJ SOLN: 100.0000 mg | Freq: Every day | INTRAMUSCULAR | Status: DC

## 2013-12-04 MED ORDER — ALUM & MAG HYDROXIDE-SIMETH 200-200-20 MG/5ML PO SUSP: 30.0000 mL | ORAL | Status: DC | PRN

## 2013-12-04 MED ORDER — ACETAMINOPHEN 325 MG PO TABS: 650.0000 mg | ORAL_TABLET | ORAL | Status: DC | PRN

## 2013-12-04 MED ORDER — NICOTINE 21 MG/24HR TD PT24: 21.0000 mg | MEDICATED_PATCH | Freq: Every day | TRANSDERMAL | Status: DC

## 2013-12-04 MED ORDER — IBUPROFEN 200 MG PO TABS: 600.0000 mg | ORAL_TABLET | Freq: Three times a day (TID) | ORAL | Status: DC | PRN

## 2013-12-04 MED ORDER — LORAZEPAM 1 MG PO TABS: 1.0000 mg | ORAL_TABLET | Freq: Three times a day (TID) | ORAL | Status: DC | PRN

## 2013-12-04 MED ORDER — LORAZEPAM 1 MG PO TABS: 0.0000 mg | ORAL_TABLET | Freq: Two times a day (BID) | ORAL | Status: DC

## 2013-12-04 NOTE — ED Notes (Signed)
Per EMS, pt has been drinking all day and wants detox. Pt denies SI/HI
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
## 2013-12-05 ENCOUNTER — Encounter (HOSPITAL_COMMUNITY): Payer: Self-pay

## 2013-12-05 ENCOUNTER — Observation Stay (HOSPITAL_COMMUNITY)
Admission: AD | Admit: 2013-12-05 | Discharge: 2013-12-06 | Disposition: A | Discharge status: Home / Self Care | Payer: Self-pay | Source: Intra-hospital | Attending: Psychiatry | Admitting: Psychiatry

## 2013-12-05 DIAGNOSIS — F17200 Nicotine dependence, unspecified, uncomplicated: Secondary | ICD-10-CM | POA: Insufficient documentation

## 2013-12-05 DIAGNOSIS — F1099 Alcohol use, unspecified with unspecified alcohol-induced disorder: Secondary | ICD-10-CM | POA: Diagnosis present

## 2013-12-05 DIAGNOSIS — F10230 Alcohol dependence with withdrawal, uncomplicated: Secondary | ICD-10-CM

## 2013-12-05 DIAGNOSIS — K219 Gastro-esophageal reflux disease without esophagitis: Secondary | ICD-10-CM | POA: Insufficient documentation

## 2013-12-05 DIAGNOSIS — F1020 Alcohol dependence, uncomplicated: Principal | ICD-10-CM | POA: Insufficient documentation

## 2013-12-05 DIAGNOSIS — F209 Schizophrenia, unspecified: Secondary | ICD-10-CM | POA: Insufficient documentation

## 2013-12-05 DIAGNOSIS — F319 Bipolar disorder, unspecified: Secondary | ICD-10-CM | POA: Insufficient documentation

## 2013-12-05 DIAGNOSIS — F1010 Alcohol abuse, uncomplicated: Secondary | ICD-10-CM

## 2013-12-05 MED ORDER — TRAZODONE HCL 50 MG PO TABS
50.0000 mg | ORAL_TABLET | Freq: Every evening | ORAL | Status: DC | PRN
  Administered 2013-12-05: 50 mg via ORAL
  Filled 2013-12-05: qty 1

## 2013-12-05 MED ORDER — CHLORDIAZEPOXIDE HCL 25 MG PO CAPS
25.0000 mg | ORAL_CAPSULE | Freq: Four times a day (QID) | ORAL | Status: AC
  Administered 2013-12-05: 25 mg via ORAL
  Filled 2013-12-05: qty 1

## 2013-12-05 MED ORDER — ADULT MULTIVITAMIN W/MINERALS CH
1.0000 | ORAL_TABLET | Freq: Every day | ORAL | Status: DC
  Administered 2013-12-05 – 2013-12-06 (×2): 1 via ORAL
  Filled 2013-12-05 (×4): qty 1

## 2013-12-05 MED ORDER — CHLORDIAZEPOXIDE HCL 25 MG PO CAPS: 25.0000 mg | ORAL_CAPSULE | ORAL | Status: DC

## 2013-12-05 MED ORDER — THIAMINE HCL 100 MG/ML IJ SOLN: 100.0000 mg | Freq: Once | INTRAMUSCULAR | Status: DC

## 2013-12-05 MED ORDER — MAGNESIUM HYDROXIDE 400 MG/5ML PO SUSP: 30.0000 mL | Freq: Every day | ORAL | Status: DC | PRN

## 2013-12-05 MED ORDER — ALUM & MAG HYDROXIDE-SIMETH 200-200-20 MG/5ML PO SUSP: 30.0000 mL | ORAL | Status: DC | PRN

## 2013-12-05 MED ORDER — ONDANSETRON 4 MG PO TBDP: 4.0000 mg | ORAL_TABLET | Freq: Four times a day (QID) | ORAL | Status: DC | PRN

## 2013-12-05 MED ORDER — HYDROXYZINE HCL 25 MG PO TABS: 25.0000 mg | ORAL_TABLET | Freq: Four times a day (QID) | ORAL | Status: DC | PRN

## 2013-12-05 MED ORDER — VITAMIN B-1 100 MG PO TABS
100.0000 mg | ORAL_TABLET | Freq: Every day | ORAL | Status: DC
  Administered 2013-12-06: 100 mg via ORAL
  Filled 2013-12-05 (×2): qty 1

## 2013-12-05 MED ORDER — ACETAMINOPHEN 325 MG PO TABS: 650.0000 mg | ORAL_TABLET | Freq: Four times a day (QID) | ORAL | Status: DC | PRN

## 2013-12-05 MED ORDER — CHLORDIAZEPOXIDE HCL 25 MG PO CAPS
25.0000 mg | ORAL_CAPSULE | Freq: Three times a day (TID) | ORAL | Status: DC
  Administered 2013-12-06: 25 mg via ORAL
  Filled 2013-12-05: qty 1

## 2013-12-05 MED ORDER — CHLORDIAZEPOXIDE HCL 25 MG PO CAPS: 25.0000 mg | ORAL_CAPSULE | Freq: Four times a day (QID) | ORAL | Status: DC | PRN

## 2013-12-05 MED ORDER — CHLORDIAZEPOXIDE HCL 25 MG PO CAPS: 25.0000 mg | ORAL_CAPSULE | Freq: Every day | ORAL | Status: DC

## 2013-12-05 MED ORDER — LOPERAMIDE HCL 2 MG PO CAPS: 2.0000 mg | ORAL_CAPSULE | ORAL | Status: DC | PRN

## 2013-12-05 NOTE — Progress Notes (Signed)
Patient is resting comfortably; CIWA is O and vitals signs stable. Patient's appetite is hearty.
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
## 2013-12-05 NOTE — BH Assessment (Signed)
Pt asleep in hall with head covered in blanket snoring loudly. Unable to arouse pt for assessment. BAL upon ED arrival 342.  Clista Bernhardt, [facility] Triage Specialist 12/05/2013 12:47 AM

## 2013-12-05 NOTE — H&P (Signed)
[facility] H&P  Patient Identification:  Jimmy [name] Date of Evaluation:  12/05/2013   Subjective: Pt seen and chart reviewed. Pt BAL was mid 300's so he is not experiencing withdrawal at this time. We will continue to monitor closely for symptoms. Pt is on the [facility]rium protocol and would like a bed at [place] for which he did have a bed lined up a few days ago. TTS will followup with this arrangement. In the meantime, pt will spend the night in the OBS Unit given his very high BAL. Pt is denying SI, HI, and AVH.   History of Present Illness:: This is a 58 year old male with past medical history significant for schizophrenia, bipolar disorder, mental disorder, alcohol abuse, presenting to the ED for alcohol detox. Patient states he drinks on a daily basis, beer only. States he drank approximately 20 beers today. Denies illicit drug use. Denies any suicidal or homicidal ideation. He denies any auditory or visual hallucinations. Patient also notes he has been experiencing diffuse chest pain for the past 2 days. He denies any associated shortness of breath, palpitations, dizziness, weakness. There is no radiation of pain into arm or neck. Patient did vomit one time earlier today, but thinks he drank his beer too quickly. He denies any current nausea. Patient has no prior cardiac history. He is a daily smoker.   Elements:  Chronic severe alcohol dependence, has been drinking for months. Associated Signs/Synptoms: Depression Symptoms:  Denies/minimizes depressive symptoms at this time (Hypo) Manic Symptoms:  Does not endorse or present with symptoms of mania or hypomania at this time Anxiety Symptoms:  Denies panic or agoraphobia or excessive worry Psychotic Symptoms:  Reports he has some hallucinations when he is actively drinking. At this time does not present with  or endorse hallucinations PTSD Symptoms: Denies  Total Time spent with patient: 45 minutes  Psychiatric Specialty  Exam: Physical Exam  Musculoskeletal: Normal range of motion.    Review of Systems  Constitutional: Negative.   HENT: Negative.   Eyes: Negative.   Respiratory: Negative.        Reports dry cough, related to chronic smoking  Cardiovascular: Positive for chest pain.  Gastrointestinal: Negative.   Genitourinary: Negative.   Musculoskeletal: Positive for myalgias.  Skin: Negative.   Neurological: Negative.   Endo/Heme/Allergies: Negative.   Psychiatric/Behavioral: Positive for depression and substance abuse. The patient is nervous/anxious.        Alcohol dependence    Height 5' 8"  (1.727 m), weight 68.04 kg (150 lb).Body mass index is 22.81 kg/(m^2).  General Appearance: Fairly Groomed  Engineer, water::  Fair  Speech:  Normal Rate  Volume:  Decreased  Mood:  Depressed  Affect:  Appropriate  Thought Process:  Goal Directed and Linear  Orientation:  Full (Time, Place, and Person)  Thought Content:  WDL  Suicidal Thoughts:  No- he denies any suicidal or homicidal ideations  Homicidal Thoughts:  No  Memory:  recent and remote grossly intact  Judgement:  Fair  Insight:  Fair  Psychomotor Activity:  Normal  Concentration:  Good  Recall:  Good  Fund of Knowledge:NA  Language: Good  Akathisia:  Negative  Handed:  Right  AIMS (if indicated):     Assets:  Desire for Improvement Resilience  Sleep:       Musculoskeletal: Strength & Muscle Tone: within normal limits- at this time is not presenting with tremors or diaphoresis or agitation /restlessness. Vitals are stable Gait & Station: normal Patient  leans: N/A  Past Psychiatric History: Diagnosis: Patient states he has been diagnosed with  Bipolar Disorder, reports episodes of increased anger and irritability lasting one to two days, reports episodes of depression. It is unclear to what degree alcohol is contributing or causative of these mood swings  Hospitalizations: states he has had prior admissions for detoxification and  bipolar disorder  Outpatient Care: none currently- had gone to [facility] in the past  Substance Abuse Care: States he has been in [place] in the past   Self-Mutilation: Denies   Suicidal Attempts: Denies   Violent Behaviors: Denies    Past Medical History:  GERD, states he has been tested negative for HIV and Viral Hepatitis Past Medical History  Diagnosis Date  . Alcohol abuse   . Schizophrenia   . Bipolar 1 disorder   . Mental disorder   . Depression   . Gallstones dx'd 08/04/2013  . GERD (gastroesophageal reflux disease)   . Alcohol related seizure ~ May 24, 2005    "I've had one"  . Bipolar disorder, unspecified 08/19/2013    History reported  . Tobacco use disorder 08/19/2013   Loss of Consciousness:  yes Seizure History:  yes- states he has had at least two seizures, which he states are likely related to ETOH withdrawal, one a few weeks ago Allergies:  No Known Allergies PTA Medications: No prescriptions prior to admission    Previous Psychotropic Medications:  Medication/Dose  Remember having taken Latuda in the past, but stopped taking it when he relapsed on alcohol               Substance Abuse History in the last 12 months:  Yes.   history of alcohol dependence- as above. He last drank 2 days ago.  Consequences of Substance Abuse: Medical Consequences:  states he has had two withdrawal seizures Blackouts:    Social History:  reports that he has been smoking Cigarettes.  He has a 80 pack-year smoking history. He does not have any smokeless tobacco history on file. He reports that he drinks about 50.4 ounces of alcohol per week. He reports that he uses illicit drugs (Cocaine and Marijuana). Additional Social History:  Current Place of Residence:   Homeless  Place of Birth:   Family Members: Marital Status:  Widowed- states wife passed away 05/24/2009 Children:   Sons: one son, no contact  Daughters: Relationships: Education:  Administrator, sports  Problems/Performance: Religious Beliefs/Practices: History of Abuse (Emotional/Phsycial/Sexual) Occupational Experiences; unemployed  Nature conservation officer History:  None. Legal History: states he has been arrested several times for breaking and entering charges, which he attributes to homelessness/looking for shelter. Hobbies/Interests:  Family History:  Has two sisters, who passed away ( from renal failure), has three brothers, mother was alcoholic.  Results for orders placed during the hospital encounter of 12/04/13 (from the past 72 hour(s))  ACETAMINOPHEN LEVEL     Status: None   Collection Time    12/04/13 10:39 PM      Result Value Ref Range   Acetaminophen (Tylenol), Serum <15.0  10 - 30 ug/mL   Comment:            THERAPEUTIC CONCENTRATIONS VARY     SIGNIFICANTLY. A RANGE OF 10-30     ug/mL MAY BE AN EFFECTIVE     CONCENTRATION FOR MANY PATIENTS.     HOWEVER, SOME ARE BEST TREATED     AT CONCENTRATIONS OUTSIDE THIS     RANGE.     ACETAMINOPHEN CONCENTRATIONS     >  150 ug/mL AT 4 HOURS AFTER     INGESTION AND >50 ug/mL AT 12     HOURS AFTER INGESTION ARE     OFTEN ASSOCIATED WITH TOXIC     REACTIONS.  CBC     Status: Abnormal   Collection Time    12/04/13 10:39 PM      Result Value Ref Range   WBC 8.2  4.0 - 10.5 K/uL   RBC 4.12 (*) 4.22 - 5.81 MIL/uL   Hemoglobin 13.7  13.0 - 17.0 g/dL   HCT 39.6  39.0 - 52.0 %   MCV 96.1  78.0 - 100.0 fL   MCH 33.3  26.0 - 34.0 pg   MCHC 34.6  30.0 - 36.0 g/dL   RDW 13.5  11.5 - 15.5 %   Platelets 300  150 - 400 K/uL  COMPREHENSIVE METABOLIC PANEL     Status: Abnormal   Collection Time    12/04/13 10:39 PM      Result Value Ref Range   Sodium 143  137 - 147 mEq/L   Potassium 3.9  3.7 - 5.3 mEq/L   Chloride 103  96 - 112 mEq/L   CO2 27  19 - 32 mEq/L   Glucose, Bld 84  70 - 99 mg/dL   BUN 7  6 - 23 mg/dL   Creatinine, Ser 0.59  0.50 - 1.35 mg/dL   Calcium 9.1  8.4 - 10.5 mg/dL   Total Protein 8.3  6.0 - 8.3 g/dL   Albumin 3.8  3.5 -  5.2 g/dL   AST 26  0 - 37 U/L   ALT 14  0 - 53 U/L   Alkaline Phosphatase 81  39 - 117 U/L   Total Bilirubin <0.2 (*) 0.3 - 1.2 mg/dL   GFR calc non Af Amer >90  >90 mL/min   GFR calc Af Amer >90  >90 mL/min   Comment: (NOTE)     The eGFR has been calculated using the CKD EPI equation.     This calculation has not been validated in all clinical situations.     eGFR's persistently <90 mL/min signify possible Chronic Kidney     Disease.   Anion gap 13  5 - 15  ETHANOL     Status: Abnormal   Collection Time    12/04/13 10:39 PM      Result Value Ref Range   Alcohol, Ethyl (B) 342 (*) 0 - 11 mg/dL   Comment:            LOWEST DETECTABLE LIMIT FOR     SERUM ALCOHOL IS 11 mg/dL     FOR MEDICAL PURPOSES ONLY  SALICYLATE LEVEL     Status: Abnormal   Collection Time    12/04/13 10:39 PM      Result Value Ref Range   Salicylate Lvl <5.0 (*) 2.8 - 20.0 mg/dL  I-STAT TROPOININ, ED     Status: None   Collection Time    12/04/13 10:47 PM      Result Value Ref Range   Troponin i, poc 0.00  0.00 - 0.08 ng/mL   Comment 3            Comment: Due to the release kinetics of cTnI,     a negative result within the first hours     of the onset of symptoms does not rule out     myocardial infarction with certainty.     If myocardial infarction is still  suspected,     repeat the test at appropriate intervals.  URINE RAPID DRUG SCREEN (HOSP PERFORMED)     Status: Abnormal   Collection Time    12/04/13 10:50 PM      Result Value Ref Range   Opiates NONE DETECTED  NONE DETECTED   Cocaine NONE DETECTED  NONE DETECTED   Benzodiazepines POSITIVE (*) NONE DETECTED   Amphetamines NONE DETECTED  NONE DETECTED   Tetrahydrocannabinol NONE DETECTED  NONE DETECTED   Barbiturates NONE DETECTED  NONE DETECTED   Comment:            DRUG SCREEN FOR MEDICAL PURPOSES     ONLY.  IF CONFIRMATION IS NEEDED     FOR ANY PURPOSE, NOTIFY LAB     WITHIN 5 DAYS.                LOWEST DETECTABLE LIMITS     FOR  URINE DRUG SCREEN     Drug Class       Cutoff (ng/mL)     Amphetamine      1000     Barbiturate      200     Benzodiazepine   269     Tricyclics       485     Opiates          300     Cocaine          300     THC              50   Psychological Evaluations:  AXIS I:  Alcohol Dependence, Bipolar Disorder by history, most recent depressed, versus alcohol induced mood disorder AXIS II:  deferred AXIS III:   Past Medical History  Diagnosis Date  . Alcohol abuse   . Schizophrenia   . Bipolar 1 disorder   . Mental disorder   . Depression   . Gallstones dx'd 08/04/2013  . GERD (gastroesophageal reflux disease)   . Alcohol related seizure ~ 2007    "I've had one"  . Bipolar disorder, unspecified 08/19/2013    History reported  . Tobacco use disorder 08/19/2013   AXIS IV:  Homelessness, unemployment, poor support system AXIS V:  Serious symptoms  Treatment Plan/Recommendations:  As below   Treatment Plan Summary: patient currently admitted to Observation Current Medications:  Current Facility-Administered Medications  Medication Dose Route Frequency Provider Last Rate Last Dose  . acetaminophen (TYLENOL) tablet 650 mg  650 mg Oral Q6H PRN Benjamine Mola, FNP      . alum & mag hydroxide-simeth (MAALOX/MYLANTA) 200-200-20 MG/5ML suspension 30 mL  30 mL Oral Q4H PRN Benjamine Mola, FNP      . chlordiazePOXIDE (LIBRIUM) capsule 25 mg  25 mg Oral Q6H PRN Benjamine Mola, FNP      . chlordiazePOXIDE (LIBRIUM) capsule 25 mg  25 mg Oral QID Benjamine Mola, FNP       Followed by  . [START ON 12/06/2013] chlordiazePOXIDE (LIBRIUM) capsule 25 mg  25 mg Oral TID Benjamine Mola, FNP       Followed by  . [START ON 12/07/2013] chlordiazePOXIDE (LIBRIUM) capsule 25 mg  25 mg Oral BH-qamhs Benjamine Mola, FNP       Followed by  . [START ON 12/09/2013] chlordiazePOXIDE (LIBRIUM) capsule 25 mg  25 mg Oral Daily Benjamine Mola, FNP      . hydrOXYzine (ATARAX/VISTARIL) tablet 25 mg  25 mg Oral Q6H PRN  Jenny Reichmann  C Withrow, FNP      . loperamide (IMODIUM) capsule 2-4 mg  2-4 mg Oral PRN Benjamine Mola, FNP      . magnesium hydroxide (MILK OF MAGNESIA) suspension 30 mL  30 mL Oral Daily PRN Benjamine Mola, FNP      . multivitamin with minerals tablet 1 tablet  1 tablet Oral Daily John C Withrow, FNP      . ondansetron (ZOFRAN-ODT) disintegrating tablet 4 mg  4 mg Oral Q6H PRN Benjamine Mola, FNP      . thiamine (B-1) injection 100 mg  100 mg Intramuscular Once Benjamine Mola, FNP      . [START ON 12/06/2013] thiamine (VITAMIN B-1) tablet 100 mg  100 mg Oral Daily Benjamine Mola, FNP      . traZODone (DESYREL) tablet 50 mg  50 mg Oral QHS PRN Benjamine Mola, FNP        PLAN- 1. At this time patient is in [place]. There are no current criteria for psychiatric commitment as he is not suicidal, homicidal , psychotic, and behavior calm and in good control. 2. Based on his history of Withdrawal Seizures he may benefit from inpatient admission for detoxification. He wants to detox and followup with [facility] for which he had an appointment a few days ago, but missed it.  3. He is agreeing to remain in OBS for further observation for now- has been in OBS for 1 hour at this time. 4. At this time he is on LIBRIUM DETOX PROTOCOL ( Q6 PRN WDL symptoms ). Also on Thiamine and Folate. 5. Staff will reevaluate- case discussed and reviewed with OBS Nursing Staff   Benjamine Mola, FNP-BC 9/14/201510:00 AM   Patient reviewed as above with NP.

## 2013-12-05 NOTE — BH Assessment (Signed)
Completed assessment. Pt does not appear to meet inpt as he is not endorsing SI/HI, a/v hallucinations, or self-harm. However, previous note indicate history of seizures with withdrawal. With discuss case with psychiatrist later in AM for recommendations regarding possible discharge to OP resources.   Informed EDP, Sharilyn Sites, PA-C who is willing to wait on final disposition recommendations later in AM.  Clista Bernhardt, [facility] Triage Specialist 12/05/2013 3:30 AM

## 2013-12-05 NOTE — Plan of Care (Signed)
[facility] Observation Crisis Plan  Reason for Crisis Plan:  Substance Abuse   Plan of Care:  Referral for Substance Abuse  Family Support: None     Current Living Environment:  Living Arrangements: Alone Lives in [place]  Insurance:   Hospital Account   Name Acct ID Class Status Primary Coverage   Jimmy [name], Jimmy [name] [id number] BEHAVIORAL HEALTH OBSERVATION Open None        Guarantor Account (for Hospital Account [id number])   Name Relation to Pt Service Area Active? Acct Type   Jimmy [name] Self [facility] Yes Behavioral Health   Address Phone       HOMELESS  [place], [place] [address] [phone](H)          Coverage Information (for Hospital Account [id number])   Not on file      Legal Guardian:     Primary Care Provider:  No PCP Per Patient  Current Outpatient Providers:  Vesta Mixer  Psychiatrist:     Counselor/Therapist:     Compliant with Medications:  No  Additional Information:   Jimmy [name] 9/14/20159:51 AM

## 2013-12-05 NOTE — Progress Notes (Signed)
[facility] INPATIENT:  Family/Significant Other Suicide Prevention Education  Suicide Prevention Education:  Patient Refusal for Family/Significant Other Suicide Prevention Education: The patient Jimmy [name] has refused to provide written consent for family/significant other to be provided Family/Significant Other Suicide Prevention Education during admission and/or prior to discharge.  Physician notified.  Desmond Szabo 12/05/2013, 9:43 AM

## 2013-12-05 NOTE — Progress Notes (Signed)
Pt alert, oriented and cooperative. Denies SI/HI. Verbally contract for safety. Denies A/[place]. Minimal interaction with staff. Denies pain or discomfort. Will continue to monitor closely and evaluate for stabilization.

## 2013-12-05 NOTE — ED Notes (Signed)
Pt walked to restroom independently with no assistance

## 2013-12-05 NOTE — ED Provider Notes (Signed)
Medical screening examination/treatment/procedure(s) were performed by non-physician practitioner and as supervising physician I was immediately available for consultation/collaboration.   EKG Interpretation   Date/Time:  Sunday December 04 2013 22:23:12 EDT Ventricular Rate:  82 PR Interval:  208 QRS Duration: 119 QT Interval:  378 QTC Calculation: 441 R Axis:   59 Text Interpretation:  Sinus rhythm Borderline prolonged PR interval  Incomplete right bundle branch block Baseline wander in lead(s) V3  Confirmed by Mirian Mo [phone]) on 12/04/2013 10:26:29 PM        Tomasita Crumble, MD 12/05/13 1615

## 2013-12-05 NOTE — Progress Notes (Addendum)
This is a 58 year old male who was a patient in the Obs unit within the last 7 days. Patient states he drinks on a daily basis, beer only. States he drank approximately 20 beers on day of admission but denies illicit drug use. Patient denies SI/HI or psychosis. Patient also denies chest pain on admission. Patient is currently homeless living on the streets with his brothers. Patient admits to drinking what he can get his hand on. Patient's CIWA on admission was 1.  Patient was malodorous on admission. Patient showered and ate noon meal. No concerns voiced at this time.  Patient's BAL was 342 on admission to the ED and was positive for benzodiazepines.

## 2013-12-06 DIAGNOSIS — F1910 Other psychoactive substance abuse, uncomplicated: Secondary | ICD-10-CM

## 2013-12-06 DIAGNOSIS — F1994 Other psychoactive substance use, unspecified with psychoactive substance-induced mood disorder: Secondary | ICD-10-CM

## 2013-12-06 DIAGNOSIS — F1020 Alcohol dependence, uncomplicated: Secondary | ICD-10-CM

## 2013-12-06 NOTE — [facility] Suicide Risk Assessment (Signed)
Suicide Risk Assessment  Discharge Assessment     Demographic Factors:  Male, Unemployed and homeless  Total Time spent with patient: 30 minutes  Psychiatric Specialty Exam:     Blood pressure 120/76, pulse 62, temperature 98 F (36.7 C), temperature source Oral, resp. rate 18, height  (1.727 m), weight 68.04 kg (150 lb), SpO2 99.00%.Body mass index is 22.81 kg/(m^2).  General Appearance: Fairly Groomed  Patent attorney::  Fair  Speech:  Clear and Coherent  Volume:  Normal  Mood:  Euthymic  Affect:  Appropriate  Thought Process:  Coherent and Goal Directed  Orientation:  Full (Time, Place, and Person)  Thought Content:  plans as he moves on, wants to be D/C today will get back with his brothers  Suicidal Thoughts:  No  Homicidal Thoughts:  No  Memory:  Immediate;   Fair Recent;   Fair Remote;   Fair  Judgement:  Fair  Insight:  Present and Shallow  Psychomotor Activity:  Normal  Concentration:  Fair  Recall:  [employer] of Knowledge:NA  Language: Fair  Akathisia:  No  Handed:    AIMS (if indicated):     Assets:  Desire for Improvement  Sleep:       Musculoskeletal: Strength & Muscle Tone: within normal limits Gait & Station: normal Patient leans: N/A   Mental Status Per Nursing Assessment::   On Admission:  NA  Current Mental Status by Physician: In full contact with reality. There are no active S/S of withdrawal. No active SI plans or intent.   Loss Factors: Financial problems/change in socioeconomic status  Historical Factors: NA  Risk Reduction Factors:   Positive social support  Continued Clinical Symptoms:  Alcohol/Substance Abuse/Dependencies  Cognitive Features That Contribute To Risk:  Polarized thinking Thought constriction (tunnel vision)    Suicide Risk:  Minimal: No identifiable suicidal ideation.  Patients presenting with no risk factors but with morbid ruminations; may be classified as minimal risk based on the severity of the  depressive symptoms  Discharge Diagnoses:   AXIS I:  Alcohol Dependence AXIS II:  No diagnosis AXIS III:   Past Medical History  Diagnosis Date  . Alcohol abuse   . Schizophrenia   . Bipolar 1 disorder   . Mental disorder   . Depression   . Gallstones dx'd 08/04/2013  . GERD (gastroesophageal reflux disease)   . Alcohol related seizure ~ 2007    "I've had one"  . Bipolar disorder, unspecified 08/19/2013    History reported  . Tobacco use disorder 08/19/2013   AXIS IV:  other psychosocial or environmental problems AXIS V:  61-70 mild symptoms  Plan Of Care/Follow-up recommendations:  Activity:  as tolerated Diet:  regular Follow up outpatient basis Is patient on multiple antipsychotic therapies at discharge:  No   Has Patient had three or more failed trials of antipsychotic monotherapy by history:  No  Recommended Plan for Multiple Antipsychotic Therapies: NA    Qaadir Kent A 12/06/2013, 12:27 PM

## 2013-12-06 NOTE — Discharge Instructions (Signed)
Many people successfully achieve sobriety by participating in Alcoholics Anonymous.  To help you maintain a sober lifestyle, consider attending a meeting today.  Visit this website to find a meeting near you:  [email or web address]  If you do not have Internet access, the following phone number may be helpful in finding a meeting:  [phone]

## 2013-12-06 NOTE — Plan of Care (Signed)
[facility] Observation Crisis Plan  Reason for Crisis Plan:  Substance Abuse   Plan of Care:  Referral for Substance Abuse  Family Support:    Pt reports having no family supports, but later voices intent to go to the home of his brothers in [place].  Current Living Environment:  Living Arrangements: Alone; Homeless  Insurance:  Self Pay Hospital Account   Name Acct ID Class Status Primary Coverage   Jimmy [name], Jimmy [name] [id number] BEHAVIORAL HEALTH OBSERVATION Discharged/Not Billed None        Guarantor Account (for Hospital Account [id number])   Name Relation to Pt Service Area Active? Acct Type   Jimmy [name] Self [facility] Yes Behavioral Health   Address Phone       [address] [place], [place] [address] [phone](H)          Coverage Information (for Hospital Account [id number])   Not on file      Legal Guardian:   Self  Primary Care Provider:  No PCP Per Patient  Current Outpatient Providers:  Jimmy [name]  Psychiatrist:   Vesta [name]  Counselor/Therapist:   [facility]  Compliant with Medications:  Yes; pt is currently not on any medications  Additional Information: After consulting with Jimmy Head, NP it has been determined that pt does not present a life threatening danger to himself or others, and that psychiatric hospitalization is not indicated for him at this time.  While pt would most likely benefit from admission to a substance abuse treatment facility or program, at this time he is refusing referrals.  Information has been included in his discharge instructions to help him find a convenient Alcoholics Anonymous meeting, if he so chooses.  Pt has requested, and been given, a [place] bus pass along with $2.50 for [facility] bus fare.  Jimmy Canning, MA Triage Specialist Jimmy [name] Jimmy [name] 9/15/20155:13 PM

## 2013-12-06 NOTE — Progress Notes (Signed)
Patient ID: Jimmy [name], male   DOB: [birth year], 57 y.o.   MRN: [id number] Patient discharged to home.  Reviewed all discharge instructions.  Patient declined referrals for follow up care.  He denies thoughts of suicide or harm to others.  He left the unit ambulatory.  He was given a bus pass and money for the [facility] bus.
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
## 2013-12-07 NOTE — ED Provider Notes (Signed)
Medical screening examination/treatment/procedure(s) were performed by non-physician practitioner and as supervising physician I was immediately available for consultation/collaboration.   EKG Interpretation None        Nattalie Santiesteban, DO 12/07/13 0751 

## 2013-12-07 NOTE — Discharge Summary (Signed)
Physician Discharge Summary Note  Patient:  Jimmy [name] is an 58 y.o., male MRN:  [id number] DOB:  08/08/1955 Patient phone:  [phone] (home)  Patient address:   [address] [place] [address],  Total Time spent with patient: 30 minutes  Date of Admission:  12/01/2013 Date of Discharge:12/02/2013  Reason for Admission:  58 Y/O male with history of alcohol dependence who came requesting detox. He also carries a diagnosis of Bipolar Disorder but has not pursued regular treatment for the samef  Discharge Diagnoses: Active Problems:   S/P alcohol detoxification   Psychiatric Specialty Exam: Physical Exam  Review of Systems  Constitutional: Negative.   HENT: Negative.   Eyes: Negative.   Respiratory: Negative.   Cardiovascular: Negative.   Gastrointestinal: Negative.   Genitourinary: Negative.   Musculoskeletal: Negative.   Skin: Negative.   Neurological: Negative.   Endo/Heme/Allergies: Negative.   Psychiatric/Behavioral: Positive for substance abuse.    Blood pressure 136/88, pulse 74, temperature 97.4 F (36.3 C), temperature source Oral, resp. rate 16, height  (1.727 m), weight 65.772 kg (145 lb), SpO2 100.00%.Body mass index is 22.05 kg/(m^2).  General Appearance: Fairly Groomed  Patent attorney::  Fair  Speech:  Clear and Coherent  Volume:  Normal  Mood:  Euthymic  Affect:  Appropriate  Thought Process:  Coherent and Goal Directed  Orientation:  Full (Time, Place, and Person)  Thought Content:  his plans as he moves on  Suicidal Thoughts:  No  Homicidal Thoughts:  No  Memory:  Immediate;   Fair Recent;   Fair Remote;   Fair  Judgement:  Fair  Insight:  Present and Shallow  Psychomotor Activity:  Normal  Concentration:  Fair  Recall:  [employer] of Knowledge:NA  Language: Fair  Akathisia:  No  Handed:    AIMS (if indicated):     Assets:  Desire for Improvement  Sleep:       Past Psychiatric History: Diagnosis:  Hospitalizations:  Outpatient  Care:  Substance Abuse Care:  Self-Mutilation:  Suicidal Attempts:  Violent Behaviors:   Musculoskeletal: Strength & Muscle Tone: within normal limits Gait & Station: normal Patient leans: N/A  DSM5: Substance/Addictive Disorders:  Alcohol Related Disorder - Severe (303.90)  Axis Diagnosis:   AXIS I:  Substance Induced Mood Disorder AXIS II:  No diagnosis AXIS III:   Past Medical History  Diagnosis Date  . Alcohol abuse   . Schizophrenia   . Bipolar 1 disorder   . Mental disorder   . Depression   . Gallstones dx'd 08/04/2013  . GERD (gastroesophageal reflux disease)   . Alcohol related seizure ~ 2007    "I've had one"  . Bipolar disorder, unspecified 08/19/2013    History reported  . Tobacco use disorder 08/19/2013   AXIS IV:  other psychosocial or environmental problems AXIS V:  61-70 mild symptoms  Level of Care:  OP  Hospital Course:  He was detox with Librium. The detox went uneventfully. He requested to be D/C as was going to [facility]. He declined outpatient referral as he was not committed to abstinence and he knew that if he was to take medications he could not drink  Consults:  None  Significant Diagnostic Studies:  labs: see chart  Discharge Vitals:   Blood pressure 136/88, pulse 74, temperature 97.4 F (36.3 C), temperature source Oral, resp. rate 16, height  (1.727 m), weight 65.772 kg (145 lb), SpO2 100.00%. Body mass index is 22.05 kg/(m^2). Lab Results:  No results found for this or any previous visit (from the past 72 hour(s)).  Physical Findings: AIMS: Facial and Oral Movements Muscles of Facial Expression: None, normal Lips and Perioral Area: None, normal Jaw: None, normal Tongue: None, normal,Extremity Movements Upper (arms, wrists, hands, fingers): None, normal Lower (legs, knees, ankles, toes): None, normal, Trunk Movements Neck, shoulders, hips: None, normal, Overall Severity Severity of abnormal movements (highest score from  questions above): None, normal Incapacitation due to abnormal movements: None, normal Patient's awareness of abnormal movements (rate only patient's report): No Awareness, Dental Status Current problems with teeth and/or dentures?: No Does patient usually wear dentures?: No  CIWA:  CIWA-Ar Total: 0 COWS:  COWS Total Score: 0  Psychiatric Specialty Exam: See Psychiatric Specialty Exam and Suicide Risk Assessment completed by Attending Physician prior to discharge.  Discharge destination:  Home  Is patient on multiple antipsychotic therapies at discharge:  No   Has Patient had three or more failed trials of antipsychotic monotherapy by history:  No  Recommended Plan for Multiple Antipsychotic Therapies: NA     Medication List    Notice   You have not been prescribed any medications.       Follow-up recommendations:  Activity:  as tolerated Diet:  regular  Comments:  Encouraged to abstain from drinking an pursue further treatment for his mood/thought disorder  Total Discharge Time:  Less than 30 minutes.  Signed: Milen Lengacher A 12/07/2013, 5:36 PM

## 2013-12-26 ENCOUNTER — Emergency Department (HOSPITAL_BASED_OUTPATIENT_CLINIC_OR_DEPARTMENT_OTHER)
Admission: EM | Admit: 2013-12-26 | Discharge: 2013-12-26 | Disposition: A | Discharge status: Home / Self Care | Payer: Self-pay | Attending: Emergency Medicine | Admitting: Emergency Medicine

## 2013-12-26 ENCOUNTER — Encounter (HOSPITAL_BASED_OUTPATIENT_CLINIC_OR_DEPARTMENT_OTHER): Payer: Self-pay | Admitting: Emergency Medicine

## 2013-12-26 DIAGNOSIS — Z720 Tobacco use: Secondary | ICD-10-CM | POA: Insufficient documentation

## 2013-12-26 DIAGNOSIS — Z8659 Personal history of other mental and behavioral disorders: Secondary | ICD-10-CM | POA: Insufficient documentation

## 2013-12-26 DIAGNOSIS — Z8719 Personal history of other diseases of the digestive system: Secondary | ICD-10-CM | POA: Insufficient documentation

## 2013-12-26 DIAGNOSIS — L02415 Cutaneous abscess of right lower limb: Secondary | ICD-10-CM | POA: Insufficient documentation

## 2013-12-26 MED ORDER — OXYCODONE-ACETAMINOPHEN 5-325 MG PO TABS
1.0000 | ORAL_TABLET | Freq: Once | ORAL | Status: AC
Start: 2013-12-26 — End: 2013-12-26
  Administered 2013-12-26: 1 via ORAL
  Filled 2013-12-26: qty 1

## 2013-12-26 MED ORDER — CLINDAMYCIN HCL 150 MG PO CAPS
300.0000 mg | ORAL_CAPSULE | Freq: Once | ORAL | Status: DC
  Filled 2013-12-26: qty 2

## 2013-12-26 MED ORDER — SULFAMETHOXAZOLE-TMP DS 800-160 MG PO TABS
1.0000 | ORAL_TABLET | Freq: Once | ORAL | Status: AC
Start: 2013-12-26 — End: 2013-12-26
  Administered 2013-12-26: 1 via ORAL
  Filled 2013-12-26: qty 1

## 2013-12-26 MED ORDER — SULFAMETHOXAZOLE-TMP DS 800-160 MG PO TABS: 1.0000 | ORAL_TABLET | Freq: Two times a day (BID) | ORAL | Status: DC

## 2013-12-26 NOTE — ED Notes (Addendum)
Pt arrived via [facility] with c/o right  hip for pain for past 3 weeks. Pt. States he is chronically homeless.   Denies any injury. States he has been drinking etoh tonight. Able to ambulate on arrival. Fevers unknown.

## 2013-12-26 NOTE — ED Notes (Signed)
MD at bedside. 

## 2013-12-26 NOTE — ED Notes (Signed)
Pt did not want vitals taken at d/c home.
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
## 2013-12-26 NOTE — Discharge Instructions (Signed)
Return to the ED with any concerns including fever/chills, increased area of redness, redness streaking up or down your leg, vomiting and not able to keep down liquids or antibiotics, decreased level of alertness/lethargy, or any other alarming symptoms  This antibiotic prescription is on [facility] free [facility] as well as on the [facility] list

## 2014-01-02 ENCOUNTER — Encounter (HOSPITAL_COMMUNITY): Payer: Self-pay | Admitting: Emergency Medicine

## 2014-01-02 ENCOUNTER — Emergency Department (HOSPITAL_COMMUNITY)
Admission: EM | Admit: 2014-01-02 | Discharge: 2014-01-03 | Disposition: A | Discharge status: Home / Self Care | Payer: Self-pay | Attending: Emergency Medicine | Admitting: Emergency Medicine

## 2014-01-02 DIAGNOSIS — Z720 Tobacco use: Secondary | ICD-10-CM | POA: Insufficient documentation

## 2014-01-02 DIAGNOSIS — Z8659 Personal history of other mental and behavioral disorders: Secondary | ICD-10-CM | POA: Insufficient documentation

## 2014-01-02 DIAGNOSIS — F10120 Alcohol abuse with intoxication, uncomplicated: Secondary | ICD-10-CM | POA: Insufficient documentation

## 2014-01-02 DIAGNOSIS — Z8719 Personal history of other diseases of the digestive system: Secondary | ICD-10-CM | POA: Insufficient documentation

## 2014-01-02 DIAGNOSIS — Z792 Long term (current) use of antibiotics: Secondary | ICD-10-CM | POA: Insufficient documentation

## 2014-01-02 NOTE — ED Notes (Signed)
Pt states that he wants detox from ETOH, last drink prior to arrival, pt denies SI/HI
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
## 2014-01-03 ENCOUNTER — Encounter (HOSPITAL_COMMUNITY): Payer: Self-pay | Admitting: Registered Nurse

## 2014-01-03 DIAGNOSIS — F1014 Alcohol abuse with alcohol-induced mood disorder: Secondary | ICD-10-CM

## 2014-01-03 LAB — RAPID URINE DRUG SCREEN, HOSP PERFORMED
Amphetamines: NOT DETECTED
Barbiturates: NOT DETECTED
Benzodiazepines: NOT DETECTED
Cocaine: NOT DETECTED
Opiates: NOT DETECTED
Tetrahydrocannabinol: NOT DETECTED

## 2014-01-03 LAB — COMPREHENSIVE METABOLIC PANEL WITH GFR
ALT: 14 U/L (ref 0–53)
AST: 34 U/L (ref 0–37)
Albumin: 3.4 g/dL — ABNORMAL LOW (ref 3.5–5.2)
Alkaline Phosphatase: 95 U/L (ref 39–117)
Anion gap: 15 (ref 5–15)
BUN: 4 mg/dL — ABNORMAL LOW (ref 6–23)
CO2: 23 meq/L (ref 19–32)
Calcium: 8.5 mg/dL (ref 8.4–10.5)
Chloride: 100 meq/L (ref 96–112)
Creatinine, Ser: 0.55 mg/dL (ref 0.50–1.35)
GFR calc Af Amer: >90 mL/min
GFR calc non Af Amer: >90 mL/min
Glucose, Bld: 95 mg/dL (ref 70–99)
Potassium: 3.7 meq/L (ref 3.7–5.3)
Sodium: 138 meq/L (ref 137–147)
Total Bilirubin: <0.2 mg/dL — ABNORMAL LOW (ref 0.3–1.2)
Total Protein: 7.9 g/dL (ref 6.0–8.3)

## 2014-01-03 LAB — CBC
HCT: 37.8 % — ABNORMAL LOW (ref 39.0–52.0)
Hemoglobin: 12.8 g/dL — ABNORMAL LOW (ref 13.0–17.0)
MCH: 33 pg (ref 26.0–34.0)
MCHC: 33.9 g/dL (ref 30.0–36.0)
MCV: 97.4 fL (ref 78.0–100.0)
Platelets: 288 10*3/uL (ref 150–400)
RBC: 3.88 MIL/uL — ABNORMAL LOW (ref 4.22–5.81)
RDW: 12.7 % (ref 11.5–15.5)
WBC: 6.8 10*3/uL (ref 4.0–10.5)

## 2014-01-03 LAB — ETHANOL: Alcohol, Ethyl (B): 269 mg/dL — ABNORMAL HIGH (ref 0–11)

## 2014-01-03 MED ORDER — LORAZEPAM 1 MG PO TABS
0.0000 mg | ORAL_TABLET | Freq: Four times a day (QID) | ORAL | Status: DC
  Administered 2014-01-03: 1 mg via ORAL
  Filled 2014-01-03: qty 1

## 2014-01-03 MED ORDER — LORAZEPAM 1 MG PO TABS: 0.0000 mg | ORAL_TABLET | Freq: Two times a day (BID) | ORAL | Status: DC

## 2014-01-03 MED ORDER — THIAMINE HCL 100 MG/ML IJ SOLN
100.0000 mg | Freq: Every day | INTRAMUSCULAR | Status: DC
  Filled 2014-01-03: qty 2

## 2014-01-03 MED ORDER — VITAMIN B-1 100 MG PO TABS
100.0000 mg | ORAL_TABLET | Freq: Every day | ORAL | Status: DC
  Administered 2014-01-03: 100 mg via ORAL
  Filled 2014-01-03: qty 1

## 2014-01-03 NOTE — ED Notes (Signed)
Bed: [facility] Expected date:  Expected time:  Means of arrival:  Comments: 

## 2014-01-03 NOTE — ED Notes (Signed)
Pt watching TV and in no distress. Did not eat breakfast provided him. Pt reports "I just did not feel like eating".

## 2014-01-03 NOTE — Discharge Instructions (Signed)
Finding Treatment for Alcohol and Drug Addiction It can be hard to find the right place to get professional treatment. Here are some important things to consider:  There are different types of treatment to choose from.  Some programs are live-in (residential) while others are not (outpatient). Sometimes a combination is offered.  No single type of program is right for everyone.  Most treatment programs involve a combination of education, counseling, and a 12-step, spiritually-based approach.  There are non-spiritually based programs (not 12-step).  Some treatment programs are government sponsored. They are geared for patients without private insurance.  Treatment programs can vary in many respects such as:  Cost and types of insurance accepted.  Types of on-site medical services offered.  Length of stay, setting, and size.  Overall philosophy of treatment. A person may need specialized treatment or have needs not addressed by all programs. For example, adolescents need treatment appropriate for their age. Other people have secondary disorders that must be managed as well. Secondary conditions can include mental illness, such as depression or diabetes. Often, a period of detoxification from alcohol or drugs is needed. This requires medical supervision and not all programs offer this. THINGS TO CONSIDER WHEN SELECTING A TREATMENT PROGRAM   Is the program certified by the appropriate government agency? Even private programs must be certified and employ certified professionals.  Does the program accept your insurance? If not, can a payment plan be set up?  Is the facility clean, organized, and well run? Do they allow you to speak with graduates who can share their treatment experience with you? Can you tour the facility? Can you meet with staff?  Does the program meet the full range of individual needs?  Does the treatment program address sexual orientation and physical disabilities?  Do they provide age, gender, and culturally appropriate treatment services?  Is treatment available in languages other than English?  Is long-term aftercare support or guidance encouraged and provided?  Is assessment of an individual's treatment plan ongoing to ensure it meets changing needs?  Does the program use strategies to encourage reluctant patients to remain in treatment long enough to increase the likelihood of success?  Does the program offer counseling (individual or group) and other behavioral therapies?  Does the program offer medicine as part of the treatment regimen, if needed?  Is there ongoing monitoring of possible relapse? Is there a defined relapse prevention program? Are services or referrals offered to family members to ensure they understand addiction and the recovery process? This would help them support the recovering individual.  Are 12-step meetings held at the center or is transport available for patients to attend outside meetings? In countries outside of the [place], Magazine features editorsee local directories for contact information for services in your area. Document Released: 02/06/2005 Document Revised: 06/02/2011 Document Reviewed: 08/19/2007 [facility] Patient Information 2015 [place]. This information is not intended to replace advice given to you by your health care provider. Make sure you discuss any questions you have with your health care provider.  Alcohol Intoxication Alcohol intoxication occurs when you drink enough alcohol that it affects your ability to function. It can be mild or very severe. Drinking a lot of alcohol in a short time is called binge drinking. This can be very harmful. Drinking alcohol can also be more dangerous if you are taking medicines or other drugs. Some of the effects caused by alcohol may include:  Loss of coordination.  Changes in mood and behavior.  Unclear thinking.  Trouble talking (slurred speech).  Throwing up  (vomiting).  Confusion.  Slowed breathing.  Twitching and shaking (seizures).  Loss of consciousness. HOME CARE  Do not drive after drinking alcohol.  Drink enough water and fluids to keep your pee (urine) clear or pale yellow. Avoid caffeine.  Only take medicine as told by your doctor. GET HELP IF:  You throw up (vomit) many times.  You do not feel better after a few days.  You frequently have alcohol intoxication. Your doctor can help decide if you should see a substance use treatment counselor. GET HELP RIGHT AWAY IF:  You become shaky when you stop drinking.  You have twitching and shaking.  You throw up blood. It may look bright red or like coffee grounds.  You notice blood in your poop (bowel movements).  You become lightheaded or pass out (faint). MAKE SURE YOU:   Understand these instructions.  Will watch your condition.  Will get help right away if you are not doing well or get worse. Document Released: 08/27/2007 Document Revised: 11/10/2012 Document Reviewed: 08/13/2012 [facility] Patient Information 2015 [place], [place]. This information is not intended to replace advice given to you by your health care provider. Make sure you discuss any questions you have with your health care provider.  Alcohol Use Disorder Alcohol use disorder is a mental disorder. It is not a one-time incident of heavy drinking. Alcohol use disorder is the excessive and uncontrollable use of alcohol over time that leads to problems with functioning in one or more areas of daily living. People with this disorder risk harming themselves and others when they drink to excess. Alcohol use disorder also can cause other mental disorders, such as mood and anxiety disorders, and serious physical problems. People with alcohol use disorder often misuse other drugs.  Alcohol use disorder is common and widespread. Some people with this disorder drink alcohol to cope with or escape from negative life  events. Others drink to relieve chronic pain or symptoms of mental illness. People with a family history of alcohol use disorder are at higher risk of losing control and using alcohol to excess.  SYMPTOMS  Signs and symptoms of alcohol use disorder may include the following:   Consumption ofalcohol inlarger amounts or over a longer period of time than intended.  Multiple unsuccessful attempts to cutdown or control alcohol use.   A great deal of time spent obtaining alcohol, using alcohol, or recovering from the effects of alcohol (hangover).  A strong desire or urge to use alcohol (cravings).   Continued use of alcohol despite problems at work, school, or home because of alcohol use.   Continued use of alcohol despite problems in relationships because of alcohol use.  Continued use of alcohol in situations when it is physically hazardous, such as driving a car.  Continued use of alcohol despite awareness of a physical or psychological problem that is likely related to alcohol use. Physical problems related to alcohol use can involve the brain, heart, liver, stomach, and intestines. Psychological problems related to alcohol use include intoxication, depression, anxiety, psychosis, delirium, and dementia.   The need for increased amounts of alcohol to achieve the same desired effect, or a decreased effect from the consumption of the same amount of alcohol (tolerance).  Withdrawal symptoms upon reducing or stopping alcohol use, or alcohol use to reduce or avoid withdrawal symptoms. Withdrawal symptoms include:  Racing heart.  Hand tremor.  Difficulty sleeping.  Nausea.  Vomiting.  Hallucinations.  Restlessness.  Seizures. DIAGNOSIS Alcohol use disorder is diagnosed through an assessment by your health care provider. Your health care provider may start by asking three or four questions to screen for excessive or problematic alcohol use. To confirm a diagnosis of alcohol use  disorder, at least two symptoms must be present within a 6925-month period. The severity of alcohol use disorder depends on the number of symptoms:  Mild--two or three.  Moderate--four or five.  Severe--six or more. Your health care provider may perform a physical exam or use results from lab tests to see if you have physical problems resulting from alcohol use. Your health care provider may refer you to a mental health professional for evaluation. TREATMENT  Some people with alcohol use disorder are able to reduce their alcohol use to low-risk levels. Some people with alcohol use disorder need to quit drinking alcohol. When necessary, mental health professionals with specialized training in substance use treatment can help. Your health care provider can help you decide how severe your alcohol use disorder is and what type of treatment you need. The following forms of treatment are available:   Detoxification. Detoxification involves the use of prescription medicines to prevent alcohol withdrawal symptoms in the first week after quitting. This is important for people with a history of symptoms of withdrawal and for heavy drinkers who are likely to have withdrawal symptoms. Alcohol withdrawal can be dangerous and, in severe cases, cause death. Detoxification is usually provided in a hospital or in-patient substance use treatment facility.  Counseling or talk therapy. Talk therapy is provided by substance use treatment counselors. It addresses the reasons people use alcohol and ways to keep them from drinking again. The goals of talk therapy are to help people with alcohol use disorder find healthy activities and ways to cope with life stress, to identify and avoid triggers for alcohol use, and to handle cravings, which can cause relapse.  Medicines.Different medicines can help treat alcohol use disorder through the following actions:  Decrease alcohol cravings.  Decrease the positive reward response  felt from alcohol use.  Produce an uncomfortable physical reaction when alcohol is used (aversion therapy).  Support groups. Support groups are run by people who have quit drinking. They provide emotional support, advice, and guidance. These forms of treatment are often combined. Some people with alcohol use disorder benefit from intensive combination treatment provided by specialized substance use treatment centers. Both inpatient and outpatient treatment programs are available. Document Released: 04/17/2004 Document Revised: 07/25/2013 Document Reviewed: 06/17/2012 [facility] Patient Information 2015 [place], [place]. This information is not intended to replace advice given to you by your health care provider. Make sure you discuss any questions you have with your health care provider.

## 2014-01-03 NOTE — [facility] Suicide Risk Assessment (Cosign Needed)
Suicide Risk Assessment  Discharge Assessment     Demographic Factors:  Male and Caucasian  Total Time spent with patient: 30 minutes  Psychiatric Specialty Exam:     Blood pressure 139/76, pulse 85, temperature 98.9 F (37.2 C), temperature source Oral, resp. rate 14, height 5\' 8"  (1.727 m), weight 65.772 kg (145 lb), SpO2 98.00%.Body mass index is 22.05 kg/(m^2).   General Appearance: Disheveled   Eye Contact:: Good   Speech: Clear and Coherent and Normal Rate   Volume: Normal   Mood: Anxious   Affect: Congruent   Thought Process: Circumstantial and Goal Directed   Orientation: Full (Time, Place, and Person)   Thought Content: WDL   Suicidal Thoughts: No   Homicidal Thoughts: No   Memory: Immediate; Good  Recent; Good  Remote; Good   Judgement: Fair   Insight: Lacking   Psychomotor Activity: Normal   Concentration: Fair   Recall: Good   Fund of Knowledge:Good   Language: Good   Akathisia: No   Handed: Right   AIMS (if indicated):   Assets: Communication Skills  Desire for Improvement   Sleep:   Musculoskeletal:  Strength & Muscle Tone: within normal limits  Gait & Station: normal  Patient leans: N/A    Mental Status Per Nursing Assessment::   On Admission:     Current Mental Status by Physician: Patient denies suicidal/homicidal ideation, psychosis, and paranoia'  Loss Factors: Homeless  Historical Factors: Family history of mental illness or substance abuse  Risk Reduction Factors:   Seeking treatment  Continued Clinical Symptoms:  Alcohol/Substance Abuse/Dependencies  Cognitive Features That Contribute To Risk:  Closed-mindedness    Suicide Risk:  Minimal: No identifiable suicidal ideation.  Patients presenting with no risk factors but with morbid ruminations; may be classified as minimal risk based on the severity of the depressive symptoms  Discharge Diagnoses:   AXIS I:  Alcohol Abuse and Substance Induced Mood Disorder AXIS II:   Deferred AXIS III:   Past Medical History  Diagnosis Date  . Alcohol abuse   . Schizophrenia   . Bipolar 1 disorder   . Mental disorder   . Depression   . Gallstones dx'd 08/04/2013  . GERD (gastroesophageal reflux disease)   . Alcohol related seizure ~ 2007    "I've had one"  . Bipolar disorder, unspecified 08/19/2013    History reported  . Tobacco use disorder 08/19/2013   AXIS IV:  economic problems, housing problems, occupational problems, other psychosocial or environmental problems, problems related to social environment and problems with primary support group AXIS V:  61-70 mild symptoms  Plan Of Care/Follow-up recommendations:  Activity:  as tolerated Diet:  as tolerated  Is patient on multiple antipsychotic therapies at discharge:  No   Has Patient had three or more failed trials of antipsychotic monotherapy by history:  No  Recommended Plan for Multiple Antipsychotic Therapies: NA    Dellie Piasecki, FNP-BC 01/03/2014, 1:20 PM

## 2014-01-03 NOTE — ED Provider Notes (Signed)
6:25 AM BP 119/71  Pulse 91  Temp(Src) 98.9 F (37.2 C) (Oral)  Resp 15  Ht 5\' 8"  (1.727 m)  Wt 145 lb (65.772 kg)  BMI 22.05 kg/m2  SpO2 95%. Patient [facility] in sign out [facility]. The patient presented to the emergency department with EtOH intoxication. Requesting detox. Denies SI HI. Leading the patient to sit up or out for TTS consult.  Arthor CaptainAbigail Marrell Dicaprio, PA-C 01/04/14 1245

## 2014-01-03 NOTE — ED Notes (Signed)
Pt verbalized understanding of discharge instructions. Patient is stable at discharge. Patient was given bus pass.

## 2014-01-03 NOTE — Consult Note (Signed)
[facility] Consult   Reason for Consult:  Alcohol abuse and intoxication Referring Physician:  EDP  Jimmy [name] is an 58 y.o. male. Total Time spent with patient: 30 minutes  Assessment: AXIS I:  Alcohol Abuse and Substance Induced Mood Disorder AXIS II:  Deferred AXIS III:   Past Medical History  Diagnosis Date  . Alcohol abuse   . Schizophrenia   . Bipolar 1 disorder   . Mental disorder   . Depression   . Gallstones dx'd 08/04/2013  . GERD (gastroesophageal reflux disease)   . Alcohol related seizure ~ 2007    "I've had one"  . Bipolar disorder, unspecified 08/19/2013    History reported  . Tobacco use disorder 08/19/2013   AXIS IV:  economic problems, housing problems, occupational problems, problems related to social environment, problems with primary support group and Homelessness AXIS V:  61-70 mild symptoms  Plan:  No evidence of imminent risk to self or others at present.   Patient does not meet criteria for psychiatric inpatient admission. Supportive therapy provided about ongoing stressors. Discussed crisis plan, support from social network, calling 911, coming to the Emergency Department, and calling Suicide Hotline.  Subjective:   HPI:  Jimmy [name] is a 58 y.o. male presented to [facility] requesting alcohol detox.  Patient states that he drinks daily all day and all night.  Patient denies suicidal/homicidal ideation, psychosis, and paranoia.  Patient is not interested in detox patient wants long term rehab.  Patient states that he has had multiple detox and been in multiple rehab programs and usually starts to drinking the day of discharge.  Patient states "I am homeless and everybody is a alcoholic; what I'm I suppose to do."  Discussed with patient resources for rehab services and the steps need to find a bed; patient voiced understanding.    HPI Elements:   Location:  alcohol abuse. Quality:  requesting rehab services. Severity:  alcohol  intoxication. Timing:  1 night. Review of Systems  Psychiatric/Behavioral: Positive for substance abuse. Negative for depression, suicidal ideas, hallucinations and memory loss. The patient is not nervous/anxious and does not have insomnia.   All other systems reviewed and are negative.  History reviewed. No pertinent family history.  Past Psychiatric History: Past Medical History  Diagnosis Date  . Alcohol abuse   . Schizophrenia   . Bipolar 1 disorder   . Mental disorder   . Depression   . Gallstones dx'd 08/04/2013  . GERD (gastroesophageal reflux disease)   . Alcohol related seizure ~ 2007    "I've had one"  . Bipolar disorder, unspecified 08/19/2013    History reported  . Tobacco use disorder 08/19/2013    reports that he has been smoking Cigarettes.  He has a 80 pack-year smoking history. He does not have any smokeless tobacco history on file. He reports that he drinks about 50.4 ounces of alcohol per week. He reports that he uses illicit drugs (Cocaine and Marijuana). History reviewed. No pertinent family history.         Allergies:  No Known Allergies  ACT Assessment Complete:  Yes:    Educational Status    Risk to Self: Risk to self with the past 6 months Is patient at risk for suicide?: No Substance abuse history and/or treatment for substance abuse?: Yes (alcohol)  Risk to Others:    Abuse:    Prior Inpatient Therapy:    Prior Outpatient Therapy:    Additional Information:  Objective: Blood pressure 139/76, pulse 85, temperature 98.9 F (37.2 C), temperature source Oral, resp. rate 14, height 5' 8"  (1.727 m), weight 65.772 kg (145 lb), SpO2 98.00%.Body mass index is 22.05 kg/(m^2). Results for orders placed during the hospital encounter of 01/02/14 (from the past 72 hour(s))  CBC     Status: Abnormal   Collection Time    01/02/14 11:39 PM      Result Value Ref Range   WBC 6.8  4.0 - 10.5 K/uL   RBC 3.88 (*) 4.22 - 5.81 MIL/uL   Hemoglobin 12.8  (*) 13.0 - 17.0 g/dL   HCT 37.8 (*) 39.0 - 52.0 %   MCV 97.4  78.0 - 100.0 fL   MCH 33.0  26.0 - 34.0 pg   MCHC 33.9  30.0 - 36.0 g/dL   RDW 12.7  11.5 - 15.5 %   Platelets 288  150 - 400 K/uL  ETHANOL     Status: Abnormal   Collection Time    01/02/14 11:39 PM      Result Value Ref Range   Alcohol, Ethyl (B) 269 (*) 0 - 11 mg/dL   Comment:            LOWEST DETECTABLE LIMIT FOR     SERUM ALCOHOL IS 11 mg/dL     FOR MEDICAL PURPOSES ONLY  COMPREHENSIVE METABOLIC PANEL     Status: Abnormal   Collection Time    01/02/14 11:39 PM      Result Value Ref Range   Sodium 138  137 - 147 mEq/L   Potassium 3.7  3.7 - 5.3 mEq/L   Chloride 100  96 - 112 mEq/L   CO2 23  19 - 32 mEq/L   Glucose, Bld 95  70 - 99 mg/dL   BUN 4 (*) 6 - 23 mg/dL   Creatinine, Ser 0.55  0.50 - 1.35 mg/dL   Calcium 8.5  8.4 - 10.5 mg/dL   Total Protein 7.9  6.0 - 8.3 g/dL   Albumin 3.4 (*) 3.5 - 5.2 g/dL   AST 34  0 - 37 U/L   ALT 14  0 - 53 U/L   Alkaline Phosphatase 95  39 - 117 U/L   Total Bilirubin <0.2 (*) 0.3 - 1.2 mg/dL   GFR calc non Af Amer >90  >90 mL/min   GFR calc Af Amer >90  >90 mL/min   Comment: (NOTE)     The eGFR has been calculated using the CKD EPI equation.     This calculation has not been validated in all clinical situations.     eGFR's persistently <90 mL/min signify possible Chronic Kidney     Disease.   Anion gap 15  5 - 15  URINE RAPID DRUG SCREEN (HOSP PERFORMED)     Status: None   Collection Time    01/03/14 12:54 AM      Result Value Ref Range   Opiates NONE DETECTED  NONE DETECTED   Cocaine NONE DETECTED  NONE DETECTED   Benzodiazepines NONE DETECTED  NONE DETECTED   Amphetamines NONE DETECTED  NONE DETECTED   Tetrahydrocannabinol NONE DETECTED  NONE DETECTED   Barbiturates NONE DETECTED  NONE DETECTED   Comment:            DRUG SCREEN FOR MEDICAL PURPOSES     ONLY.  IF CONFIRMATION IS NEEDED     FOR ANY PURPOSE, NOTIFY LAB     WITHIN 5 DAYS.  LOWEST  DETECTABLE LIMITS     FOR URINE DRUG SCREEN     Drug Class       Cutoff (ng/mL)     Amphetamine      1000     Barbiturate      200     Benzodiazepine   127     Tricyclics       517     Opiates          300     Cocaine          300     THC              50   Labs are reviewed see abnormal values above.  Medications reviewed and no changes made.  Current Facility-Administered Medications  Medication Dose Route Frequency Provider Last Rate Last Dose  . LORazepam (ATIVAN) tablet 0-4 mg  0-4 mg Oral 4 times per day Garald Balding, NP   1 mg at 01/03/14 1034   Followed by  . [START ON 01/05/2014] LORazepam (ATIVAN) tablet 0-4 mg  0-4 mg Oral Q12H Garald Balding, NP      . thiamine (VITAMIN B-1) tablet 100 mg  100 mg Oral Daily Garald Balding, NP   100 mg at 01/03/14 1028   Or  . thiamine (B-1) injection 100 mg  100 mg Intravenous Daily Garald Balding, NP       No current outpatient prescriptions on file.    Psychiatric Specialty Exam:     Blood pressure 139/76, pulse 85, temperature 98.9 F (37.2 C), temperature source Oral, resp. rate 14, height 5' 8"  (1.727 m), weight 65.772 kg (145 lb), SpO2 98.00%.Body mass index is 22.05 kg/(m^2).  General Appearance: Disheveled  Eye Contact::  Good  Speech:  Clear and Coherent and Normal Rate  Volume:  Normal  Mood:  Anxious  Affect:  Congruent  Thought Process:  Circumstantial and Goal Directed  Orientation:  Full (Time, Place, and Person)  Thought Content:  WDL  Suicidal Thoughts:  No  Homicidal Thoughts:  No  Memory:  Immediate;   Good Recent;   Good Remote;   Good  Judgement:  Fair  Insight:  Lacking  Psychomotor Activity:  Normal  Concentration:  Fair  Recall:  Good  Fund of Knowledge:Good  Language: Good  Akathisia:  No  Handed:  Right  AIMS (if indicated):     Assets:  Communication Skills Desire for Improvement  Sleep:      Musculoskeletal: Strength & Muscle Tone: within normal limits Gait & Station: normal Patient  leans: N/A  Treatment Plan Summary: Discharge home with resorces for inpatient/outpatient and rehab service  Earleen Newport, FNP-BC 01/03/2014 1:01 PM

## 2014-01-03 NOTE — Progress Notes (Signed)
Pt provided with outpatient substance abuse information. Patient provided bus pass. Pt psychiatrically stable for dc home.  Byrd HesselbachKristen Jericka Kadar, LCSW [phone]  ED CSW 01/03/2014 1352pm

## 2014-01-03 NOTE — ED Provider Notes (Signed)
Medical screening examination/treatment/procedure(s) were performed by non-physician practitioner and as supervising physician I was immediately available for consultation/collaboration.   EKG Interpretation None        Tanishi Nault, MD 01/03/14 1348 

## 2014-01-03 NOTE — ED Notes (Signed)
Pt has 2 pt belonging bags and 1 bookbag in locker #31

## 2014-01-03 NOTE — ED Notes (Signed)
Gave PO Thiamine.

## 2014-01-04 NOTE — Consult Note (Signed)
Face to face evaluation and I agree with this note 

## 2014-01-04 NOTE — ED Provider Notes (Signed)
Medical screening examination/treatment/procedure(s) were performed by non-physician practitioner and as supervising physician I was immediately available for consultation/collaboration.   EKG Interpretation None        Tomasita CrumbleAdeleke Akelia Husted, MD 01/04/14 1416

## 2014-01-07 ENCOUNTER — Encounter (HOSPITAL_COMMUNITY): Payer: Self-pay | Admitting: Emergency Medicine

## 2014-01-07 ENCOUNTER — Emergency Department (HOSPITAL_COMMUNITY)
Admission: EM | Admit: 2014-01-07 | Discharge: 2014-01-08 | Disposition: A | Discharge status: Home / Self Care | Payer: Self-pay | Attending: Emergency Medicine | Admitting: Emergency Medicine

## 2014-01-07 DIAGNOSIS — Z8719 Personal history of other diseases of the digestive system: Secondary | ICD-10-CM | POA: Insufficient documentation

## 2014-01-07 DIAGNOSIS — Z720 Tobacco use: Secondary | ICD-10-CM | POA: Insufficient documentation

## 2014-01-07 DIAGNOSIS — F1020 Alcohol dependence, uncomplicated: Secondary | ICD-10-CM | POA: Insufficient documentation

## 2014-01-07 DIAGNOSIS — Z8659 Personal history of other mental and behavioral disorders: Secondary | ICD-10-CM | POA: Insufficient documentation

## 2014-01-07 NOTE — Discharge Instructions (Signed)
FOLLOW UP WITH OUTPATIENT TREATMENT FACILITIES IN THE RESOURCE LIST PROVIDED.  Alcohol Use Disorder Alcohol use disorder is a mental disorder. It is not a one-time incident of heavy drinking. Alcohol use disorder is the excessive and uncontrollable use of alcohol over time that leads to problems with functioning in one or more areas of daily living. People with this disorder risk harming themselves and others when they drink to excess. Alcohol use disorder also can cause other mental disorders, such as mood and anxiety disorders, and serious physical problems. People with alcohol use disorder often misuse other drugs.  Alcohol use disorder is common and widespread. Some people with this disorder drink alcohol to cope with or escape from negative life events. Others drink to relieve chronic pain or symptoms of mental illness. People with a family history of alcohol use disorder are at higher risk of losing control and using alcohol to excess.  SYMPTOMS  Signs and symptoms of alcohol use disorder may include the following:   Consumption ofalcohol inlarger amounts or over a longer period of time than intended.  Multiple unsuccessful attempts to cutdown or control alcohol use.   A great deal of time spent obtaining alcohol, using alcohol, or recovering from the effects of alcohol (hangover).  A strong desire or urge to use alcohol (cravings).   Continued use of alcohol despite problems at work, school, or home because of alcohol use.   Continued use of alcohol despite problems in relationships because of alcohol use.  Continued use of alcohol in situations when it is physically hazardous, such as driving a car.  Continued use of alcohol despite awareness of a physical or psychological problem that is likely related to alcohol use. Physical problems related to alcohol use can involve the brain, heart, liver, stomach, and intestines. Psychological problems related to alcohol use include  intoxication, depression, anxiety, psychosis, delirium, and dementia.   The need for increased amounts of alcohol to achieve the same desired effect, or a decreased effect from the consumption of the same amount of alcohol (tolerance).  Withdrawal symptoms upon reducing or stopping alcohol use, or alcohol use to reduce or avoid withdrawal symptoms. Withdrawal symptoms include:  Racing heart.  Hand tremor.  Difficulty sleeping.  Nausea.  Vomiting.  Hallucinations.  Restlessness.  Seizures. DIAGNOSIS Alcohol use disorder is diagnosed through an assessment by your health care provider. Your health care provider may start by asking three or four questions to screen for excessive or problematic alcohol use. To confirm a diagnosis of alcohol use disorder, at least two symptoms must be present within a 2815-month period. The severity of alcohol use disorder depends on the number of symptoms:  Mild--two or three.  Moderate--four or five.  Severe--six or more. Your health care provider may perform a physical exam or use results from lab tests to see if you have physical problems resulting from alcohol use. Your health care provider may refer you to a mental health professional for evaluation. TREATMENT  Some people with alcohol use disorder are able to reduce their alcohol use to low-risk levels. Some people with alcohol use disorder need to quit drinking alcohol. When necessary, mental health professionals with specialized training in substance use treatment can help. Your health care provider can help you decide how severe your alcohol use disorder is and what type of treatment you need. The following forms of treatment are available:   Detoxification. Detoxification involves the use of prescription medicines to prevent alcohol withdrawal symptoms in the first week  after quitting. This is important for people with a history of symptoms of withdrawal and for heavy drinkers who are likely to  have withdrawal symptoms. Alcohol withdrawal can be dangerous and, in severe cases, cause death. Detoxification is usually provided in a hospital or in-patient substance use treatment facility.  Counseling or talk therapy. Talk therapy is provided by substance use treatment counselors. It addresses the reasons people use alcohol and ways to keep them from drinking again. The goals of talk therapy are to help people with alcohol use disorder find healthy activities and ways to cope with life stress, to identify and avoid triggers for alcohol use, and to handle cravings, which can cause relapse.  Medicines.Different medicines can help treat alcohol use disorder through the following actions:  Decrease alcohol cravings.  Decrease the positive reward response felt from alcohol use.  Produce an uncomfortable physical reaction when alcohol is used (aversion therapy).  Support groups. Support groups are run by people who have quit drinking. They provide emotional support, advice, and guidance. These forms of treatment are often combined. Some people with alcohol use disorder benefit from intensive combination treatment provided by specialized substance use treatment centers. Both inpatient and outpatient treatment programs are available. Document Released: 04/17/2004 Document Revised: 07/25/2013 Document Reviewed: 06/17/2012 [facility] Patient Information 2015 [employer] [place]. This information is not intended to replace advice given to you by your health care provider. Make sure you discuss any questions you have with your health care provider.
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
## 2014-01-07 NOTE — ED Notes (Signed)
Pt placed in room, comfort measures provided,  Ham sandwich and coke also given per request

## 2014-01-07 NOTE — ED Notes (Signed)
Pt arrived to the ED intoxicated looking for an alcohol detox program.  Pt states he called the police to bring him.  Pt denies homicidal or suicidal intent.  [facility] but each time he comes here they don't send him there.

## 2014-01-08 NOTE — ED Notes (Signed)
Pt escorted out while I was away from desk,

## 2014-01-08 NOTE — ED Provider Notes (Signed)
Medical screening examination/treatment/procedure(s) were performed by non-physician practitioner and as supervising physician I was immediately available for consultation/collaboration.   EKG Interpretation None        Curtis Uriarte L Lavoris Sparling, MD 01/08/14 0150 

## 2014-02-12 ENCOUNTER — Emergency Department (HOSPITAL_COMMUNITY)
Admission: EM | Admit: 2014-02-12 | Discharge: 2014-02-13 | Disposition: A | Discharge status: Home / Self Care | Payer: Self-pay | Attending: Emergency Medicine | Admitting: Emergency Medicine

## 2014-02-12 DIAGNOSIS — Z8659 Personal history of other mental and behavioral disorders: Secondary | ICD-10-CM | POA: Insufficient documentation

## 2014-02-12 DIAGNOSIS — F10230 Alcohol dependence with withdrawal, uncomplicated: Secondary | ICD-10-CM | POA: Insufficient documentation

## 2014-02-12 DIAGNOSIS — F1099 Alcohol use, unspecified with unspecified alcohol-induced disorder: Secondary | ICD-10-CM | POA: Diagnosis present

## 2014-02-12 DIAGNOSIS — Z720 Tobacco use: Secondary | ICD-10-CM | POA: Insufficient documentation

## 2014-02-12 DIAGNOSIS — F1020 Alcohol dependence, uncomplicated: Secondary | ICD-10-CM | POA: Diagnosis present

## 2014-02-12 DIAGNOSIS — Z8719 Personal history of other diseases of the digestive system: Secondary | ICD-10-CM | POA: Insufficient documentation

## 2014-02-12 DIAGNOSIS — F1010 Alcohol abuse, uncomplicated: Secondary | ICD-10-CM | POA: Diagnosis present

## 2014-02-12 DIAGNOSIS — F10120 Alcohol abuse with intoxication, uncomplicated: Secondary | ICD-10-CM | POA: Diagnosis present

## 2014-02-12 DIAGNOSIS — R1084 Generalized abdominal pain: Secondary | ICD-10-CM | POA: Insufficient documentation

## 2014-02-12 DIAGNOSIS — F10930 Alcohol use, unspecified with withdrawal, uncomplicated: Secondary | ICD-10-CM

## 2014-02-12 DIAGNOSIS — F10920 Alcohol use, unspecified with intoxication, uncomplicated: Secondary | ICD-10-CM

## 2014-02-12 NOTE — ED Notes (Addendum)
[facility], pt admits to not knowing if he is suicidal or not, EMS reports pt talking to himself to objects. Strong odor etoh Pt c/o RUQ pain severe today, worse than normal. Pt endorses SI and HI, stating he would shoot himself and others but he does not have a gun. Pt states he is having auditory and visual hallucinations, but can not state what he hears or sees. Pt states he usually has hallucinations when he drinks beer. Pt unable to state how much alcohol he has consumed today, pt states "as much as I could"

## 2014-02-12 NOTE — ED Notes (Signed)
Bed: [facility] Expected date:  Expected time:  Means of arrival:  Comments: DETOX/ SI
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
## 2014-02-13 ENCOUNTER — Encounter (HOSPITAL_COMMUNITY): Payer: Self-pay | Admitting: Psychiatry

## 2014-02-13 DIAGNOSIS — F1019 Alcohol abuse with unspecified alcohol-induced disorder: Secondary | ICD-10-CM

## 2014-02-13 LAB — CBC WITH DIFFERENTIAL/PLATELET
BASOS PCT: 2 % — AB (ref 0–1)
Basophils Absolute: 0.1 10*3/uL (ref 0.0–0.1)
EOS PCT: 21 % — AB (ref 0–5)
Eosinophils Absolute: 1.6 10*3/uL — ABNORMAL HIGH (ref 0.0–0.7)
HEMATOCRIT: 36.4 % — AB (ref 39.0–52.0)
HEMOGLOBIN: 12.3 g/dL — AB (ref 13.0–17.0)
LYMPHS ABS: 2.6 10*3/uL (ref 0.7–4.0)
LYMPHS PCT: 35 % (ref 12–46)
MCH: 33.1 pg (ref 26.0–34.0)
MCHC: 33.8 g/dL (ref 30.0–36.0)
MCV: 97.8 fL (ref 78.0–100.0)
Monocytes Absolute: 0.5 10*3/uL (ref 0.1–1.0)
Monocytes Relative: 7 % (ref 3–12)
NEUTROS ABS: 2.6 10*3/uL (ref 1.7–7.7)
Neutrophils Relative %: 35 % — ABNORMAL LOW (ref 43–77)
Platelets: 314 10*3/uL (ref 150–400)
RBC: 3.72 MIL/uL — ABNORMAL LOW (ref 4.22–5.81)
RDW: 12.1 % (ref 11.5–15.5)
WBC: 7.4 10*3/uL (ref 4.0–10.5)

## 2014-02-13 LAB — COMPREHENSIVE METABOLIC PANEL
ALK PHOS: 75 U/L (ref 39–117)
ALT: 24 U/L (ref 0–53)
AST: 65 U/L — ABNORMAL HIGH (ref 0–37)
Albumin: 3.3 g/dL — ABNORMAL LOW (ref 3.5–5.2)
Anion gap: 17 — ABNORMAL HIGH (ref 5–15)
BUN: 8 mg/dL (ref 6–23)
CHLORIDE: 101 meq/L (ref 96–112)
CO2: 29 mEq/L (ref 19–32)
Calcium: 8.1 mg/dL — ABNORMAL LOW (ref 8.4–10.5)
Creatinine, Ser: 0.62 mg/dL (ref 0.50–1.35)
GFR calc non Af Amer: >90 mL/min (ref >90)
GLUCOSE: 82 mg/dL (ref 70–99)
POTASSIUM: 3 meq/L — AB (ref 3.7–5.3)
SODIUM: 147 meq/L (ref 137–147)
Total Protein: 6.9 g/dL (ref 6.0–8.3)

## 2014-02-13 LAB — RAPID URINE DRUG SCREEN, HOSP PERFORMED
Amphetamines: NOT DETECTED
BARBITURATES: NOT DETECTED
Benzodiazepines: NOT DETECTED
Cocaine: NOT DETECTED
Opiates: NOT DETECTED
TETRAHYDROCANNABINOL: NOT DETECTED

## 2014-02-13 LAB — LIPASE, BLOOD: Lipase: 75 U/L — ABNORMAL HIGH (ref 11–59)

## 2014-02-13 LAB — ETHANOL: ALCOHOL ETHYL (B): 268 mg/dL — AB (ref 0–11)

## 2014-02-13 MED ORDER — PANTOPRAZOLE SODIUM 20 MG PO TBEC
20.0000 mg | DELAYED_RELEASE_TABLET | Freq: Every day | ORAL | Status: DC
  Administered 2014-02-13: 20 mg via ORAL
  Filled 2014-02-13: qty 1

## 2014-02-13 MED ORDER — THIAMINE HCL 100 MG/ML IJ SOLN: 100.0000 mg | Freq: Every day | INTRAMUSCULAR | Status: DC

## 2014-02-13 MED ORDER — POTASSIUM CHLORIDE CRYS ER 20 MEQ PO TBCR
20.0000 meq | EXTENDED_RELEASE_TABLET | Freq: Every day | ORAL | Status: DC
  Administered 2014-02-13: 20 meq via ORAL
  Filled 2014-02-13: qty 1

## 2014-02-13 MED ORDER — CALCIUM CARBONATE ANTACID 500 MG PO CHEW
2.0000 | CHEWABLE_TABLET | Freq: Two times a day (BID) | ORAL | Status: DC | PRN
  Administered 2014-02-13: 400 mg via ORAL
  Filled 2014-02-13 (×2): qty 2

## 2014-02-13 MED ORDER — POTASSIUM CHLORIDE CRYS ER 20 MEQ PO TBCR
40.0000 meq | EXTENDED_RELEASE_TABLET | Freq: Once | ORAL | Status: AC
  Administered 2014-02-13: 40 meq via ORAL
  Filled 2014-02-13: qty 2

## 2014-02-13 MED ORDER — LORAZEPAM 2 MG/ML IJ SOLN: 0.0000 mg | Freq: Two times a day (BID) | INTRAMUSCULAR | Status: DC

## 2014-02-13 MED ORDER — NICOTINE 21 MG/24HR TD PT24: 21.0000 mg | MEDICATED_PATCH | TRANSDERMAL | Status: DC | PRN

## 2014-02-13 MED ORDER — LORAZEPAM 1 MG PO TABS: 0.0000 mg | ORAL_TABLET | Freq: Two times a day (BID) | ORAL | Status: DC

## 2014-02-13 MED ORDER — LORAZEPAM 1 MG PO TABS
0.0000 mg | ORAL_TABLET | Freq: Four times a day (QID) | ORAL | Status: DC
  Administered 2014-02-13: 1 mg via ORAL
  Filled 2014-02-13: qty 1

## 2014-02-13 MED ORDER — LORAZEPAM 2 MG/ML IJ SOLN
0.0000 mg | Freq: Four times a day (QID) | INTRAMUSCULAR | Status: DC
Start: 2014-02-13 — End: 2014-02-13

## 2014-02-13 MED ORDER — VITAMIN B-1 100 MG PO TABS
100.0000 mg | ORAL_TABLET | Freq: Every day | ORAL | Status: DC
  Administered 2014-02-13: 100 mg via ORAL
  Filled 2014-02-13: qty 1

## 2014-02-13 NOTE — ED Provider Notes (Signed)
6:10 AM: At shift change, hand-off report given by [name], PA-C. Plan includes TTS eval and follow their recommendations.    7:45 AM: Pt resting without distress, according to TTS note, they plan to re-eval later in the am.  Pt provided comfort measures including shower and breakfast. CIWA protocol provided. Patient has been medically cleared in the ED and is awaiting re-eval by TTS team for possible placement vs OP clinic information. Pt is currently not having SI or HI and appears stable in NAD. [facility].      [name], NP 02/13/14 2248  [name], MD 02/16/14 [phone]

## 2014-02-13 NOTE — ED Provider Notes (Signed)
[id number] - 58 year old male, well-known to the emergency department, presents intoxicated. Patient care assumed from Ellin Sabaiffany Green, PA-C at shift change with medical clearance labs pending. Patient complaining of abdominal pain on arrival; however, this appears to be chronic in nature. Labs today significant for hypokalemia. Patient has been given oral potassium. Labs otherwise consistent with baseline. Ethanol level is 268. Patient has been allowed to sober in the ED. He has a GCS 15 at this time. Speech is goal oriented. He ambulates with normal, steady gait.  Patient at this time denies any suicidal or homicidal thoughts, but states that he is having trouble with the "voices" in his head. He is requesting to speak to someone about this. Will consult TTS for further evaluation. Disposition to be determined by oncoming ED midlevel.   Results for orders placed or performed during the hospital encounter of 02/12/14  CBC with Differential  Result Value Ref Range   WBC 7.4 4.0 - 10.5 K/uL   RBC 3.72 (L) 4.22 - 5.81 MIL/uL   Hemoglobin 12.3 (L) 13.0 - 17.0 g/dL   HCT 16.136.4 (L) 09.639.0 - 04.552.0 %   MCV 97.8 78.0 - 100.0 fL   MCH 33.1 26.0 - 34.0 pg   MCHC 33.8 30.0 - 36.0 g/dL   RDW 40.912.1 81.111.5 - 91.415.5 %   Platelets 314 150 - 400 K/uL   Neutrophils Relative % 35 (L) 43 - 77 %   Lymphocytes Relative 35 12 - 46 %   Monocytes Relative 7 3 - 12 %   Eosinophils Relative 21 (H) 0 - 5 %   Basophils Relative 2 (H) 0 - 1 %   Neutro Abs 2.6 1.7 - 7.7 K/uL   Lymphs Abs 2.6 0.7 - 4.0 K/uL   Monocytes Absolute 0.5 0.1 - 1.0 K/uL   Eosinophils Absolute 1.6 (H) 0.0 - 0.7 K/uL   Basophils Absolute 0.1 0.0 - 0.1 K/uL   RBC Morphology TARGET CELLS    WBC Morphology ATYPICAL LYMPHOCYTES   Comprehensive metabolic panel  Result Value Ref Range   Sodium 147 137 - 147 mEq/L   Potassium 3.0 (L) 3.7 - 5.3 mEq/L   Chloride 101 96 - 112 mEq/L   CO2 29 19 - 32 mEq/L   Glucose, Bld 82 70 - 99 mg/dL   BUN 8 6 - 23 mg/dL   Creatinine, Ser 7.820.62 0.50 - 1.35 mg/dL   Calcium 8.1 (L) 8.4 - 10.5 mg/dL   Total Protein 6.9 6.0 - 8.3 g/dL   Albumin 3.3 (L) 3.5 - 5.2 g/dL   AST 65 (H) 0 - 37 U/L   ALT 24 0 - 53 U/L   Alkaline Phosphatase 75 39 - 117 U/L   Total Bilirubin <0.2 (L) 0.3 - 1.2 mg/dL   GFR calc non Af Amer >90 >90 mL/min   GFR calc Af Amer >90 >90 mL/min   Anion gap 17 (H) 5 - 15  Lipase, blood  Result Value Ref Range   Lipase 75 (H) 11 - 59 U/L  Urine rapid drug screen (hosp performed)  Result Value Ref Range   Opiates NONE DETECTED NONE DETECTED   Cocaine NONE DETECTED NONE DETECTED   Benzodiazepines NONE DETECTED NONE DETECTED   Amphetamines NONE DETECTED NONE DETECTED   Tetrahydrocannabinol NONE DETECTED NONE DETECTED   Barbiturates NONE DETECTED NONE DETECTED  Ethanol  Result Value Ref Range   Alcohol, Ethyl (B) 268 (H) 0 - 11 mg/dL    Antony MaduraKelly Tocarra Gassen, PA-C 95/62/1311/23/15 0600  Onalee Huaavid  Ranae PalmsYelverton, MD 02/13/14 [phone]

## 2014-02-13 NOTE — ED Notes (Signed)
Awake. Watching TV. Verbally responsive. Resp even and unlabored. ABC's intact. Pleasant and cooperative mood. NAD noted.

## 2014-02-13 NOTE — ED Notes (Signed)
Resting quietly with eye closed. Easily arousable. Verbally responsive. Resp even and unlabored. ABC's intact. Ambulated to BR earlier to void. Pt noted in room talking to himself. Pt stated that he does that all the time. Pleasant cooperative behavior. NAD noted.

## 2014-02-13 NOTE — ED Notes (Signed)
Pt alert and oriented. Two plastic bags placed in locker #32 along with back pack . Pt assessed with CIWA of 6. He reports passive si and passive hi "anyone that pisses me off."  Pt reports that he has a tent and has been homeless for five years. He [place]. Safety maintained.

## 2014-02-13 NOTE — [facility] Suicide Risk Assessment (Signed)
Suicide Risk Assessment  Discharge Assessment     Demographic Factors:  Caucasian and Low socioeconomic status  Total Time spent with patient: 45 minutes  Psychiatric Specialty Exam:     Blood pressure 131/82, pulse 64, temperature 97.5 F (36.4 C), temperature source Oral, resp. rate 20, SpO2 97 %.There is no weight on file to calculate BMI.  General Appearance: Disheveled  Eye SolicitorContact::  Fair  Speech:  Normal Rate  Volume:  Normal  Mood:  Euthymic  Affect:  Congruent  Thought Process:  Coherent  Orientation:  Full (Time, Place, and Person)  Thought Content:  WDL  Suicidal Thoughts:  No  Homicidal Thoughts:  No  Memory:  Immediate;   Good Recent;   Good Remote;   Good  Judgement:  Fair  Insight:  Fair  Psychomotor Activity:  Decreased  Concentration:  Good  Recall:  Good  Fund of Knowledge:Good  Language: Good  Akathisia:  No  Handed:  Right  AIMS (if indicated):     Assets:  Leisure Time Physical Health Resilience Social Support  Sleep:      Musculoskeletal: Strength & Muscle Tone: within normal limits Gait & Station: normal Patient leans: N/A  Mental Status Per Nursing Assessment::   On Admission:   Alcohol intoxication  Current Mental Status by Physician: NA  Loss Factors: NA  Historical Factors: NA  Risk Reduction Factors:   Positive social support and Positive therapeutic relationship  Continued Clinical Symptoms:  Alcohol dependence  Cognitive Features That Contribute To Risk:  None   Suicide Risk:  Minimal: No identifiable suicidal ideation.  Patients presenting with no risk factors but with morbid ruminations; may be classified as minimal risk based on the severity of the depressive symptoms  Discharge Diagnoses:   AXIS I:  Alcohol Abuse and dependence AXIS II:  Deferred AXIS III:   Past Medical History  Diagnosis Date  . Alcohol abuse   . Schizophrenia   . Bipolar 1 disorder   . Mental disorder   . Depression   .  Gallstones dx'd 08/04/2013  . GERD (gastroesophageal reflux disease)   . Alcohol related seizure ~ 2007    "I've had one"  . Bipolar disorder, unspecified 08/19/2013    History reported  . Tobacco use disorder 08/19/2013   AXIS IV:  other psychosocial or environmental problems and problems related to social environment AXIS V:  61-70 mild symptoms  Plan Of Care/Follow-up recommendations:  Activity:  as tolerated Diet:  heart healthy diet  Is patient on multiple antipsychotic therapies at discharge:  No   Has Patient had three or more failed trials of antipsychotic monotherapy by history:  No  Recommended Plan for Multiple Antipsychotic Therapies: NA  LORD, JAMISON, PMH-NP 02/13/2014, 4:00 PM

## 2014-02-13 NOTE — Progress Notes (Signed)
02/13/14 Below are a list of therapeutic placement options that were contacted to see if any bed were open.   Accepting referrals [facility] accepting referrals  [facility] accepting referrals per Jim  [facility] Accepting referrals per Linda  [facility]- Fax referrals may have an opening later per Joselin  [facility] Accepting pts. Per Dean  [facility] Accepting referrals please fax per Cherie  [facility] Accepting referrals please fax per [facility]  [facility]- no high acuity, adolescents or substance abuse  [facility]-Per Miriam adult M, F low acuity, no detox [facility]-fax info [phone] Per Adeya [facility]-per Deanna-can fax over-possible discharges [facility]-per Teranda send referrals [facility] accepting referrals at capacity today but may change [name] per Chris- 2 beds available please fax referrals  ? At Capacity [facility] per Ashley  [facility] per Jill  [facility] per Marcy [facility] per Melissa  [facility]-per Tonya at capacity [facility]-Per Megan-at capacity for adult and C/A [facility]-per Joanie, but try again this evening [facility]-per Beth, no available beds [facility]- no beds per Will Unable to reach [facility]-Unable to reach [facility]  [facility]- busy tone  [facility] left VM  [facility] # not in service  [facility]  [name]- left VM  Rees Santistevan, M.S., LPCA, NCC Licensed Professional Counselor Associate  Triage Specialist  [facility]  Therapeutic Triage Services Phone: [phone] Fax: [phone] 

## 2014-02-13 NOTE — ED Notes (Signed)
Pt c/o acid reflux and was given ordered medication along with prn TUMS. Pt vomited following medication. Assisted pt with clean up. Gave Gingerale for nausea. Alerted MD assessment room. Safety maintained.

## 2014-02-13 NOTE — BH Assessment (Signed)
Assessment completed. Consulted Nanine MeansJamison Lord, NP who recommended that pt be reassess. Dr. Ranae PalmsYelverton has been informed of the recommendation.

## 2014-02-13 NOTE — Consult Note (Signed)
[facility] Psychiatry Consult   Reason for Consult:  Alcohol intoxication Referring Physician:  EDP  Jimmy [name] is an 58 y.o. male. Total Time spent with patient: 45 minutes  Assessment: AXIS I:  Alcohol Abuse AXIS II:  Deferred AXIS III:   Past Medical History  Diagnosis Date  . Alcohol abuse   . Schizophrenia   . Bipolar 1 disorder   . Mental disorder   . Depression   . Gallstones dx'd 08/04/2013  . GERD (gastroesophageal reflux disease)   . Alcohol related seizure ~ 2007    "I've had one"  . Bipolar disorder, unspecified 08/19/2013    History reported  . Tobacco use disorder 08/19/2013   AXIS IV:  other psychosocial or environmental problems, problems related to social environment and problems with primary support group AXIS V:  61-70 mild symptoms  Plan:  No evidence of imminent risk to self or others at present.    Subjective:   Jimmy [name] is a 58 y.o. male patient does not warrant admission.Marland Kitchen  HPI:  Patient got intoxicated last night and came to the ED stating he was hearing voices.  Today, he has sobered up and denies suicidal/homicidal ideations, hallucinations.  Jimmy [name] states he does not want detox, just a bus pass.  He plans on going back to drinking with his friends.  Jimmy [name] will be stabilized from his intoxication prior to discharge. HPI Elements:   Location:  generalized. Quality:  acute. Severity:  mild. Timing:  intermittent. Duration:  drinking since the age of 22. Context:  stressors.  Past Psychiatric History: Past Medical History  Diagnosis Date  . Alcohol abuse   . Schizophrenia   . Bipolar 1 disorder   . Mental disorder   . Depression   . Gallstones dx'd 08/04/2013  . GERD (gastroesophageal reflux disease)   . Alcohol related seizure ~ 2007    "I've had one"  . Bipolar disorder, unspecified 08/19/2013    History reported  . Tobacco use disorder 08/19/2013    reports that he has been smoking Cigarettes.  He has a 80 pack-year smoking  history. He does not have any smokeless tobacco history on file. He reports that he drinks about 50.4 oz of alcohol per week. He reports that he uses illicit drugs (Cocaine and Marijuana). History reviewed. No pertinent family history. Family History Substance Abuse: No Family Supports: No Living Arrangements: Other (Comment) Can pt return to current living arrangement?: Yes Abuse/Neglect [facility]) Physical Abuse: Denies Verbal Abuse: Denies Sexual Abuse: Denies Allergies:  No Known Allergies  ACT Assessment Complete:  Yes:    Educational Status    Risk to Self: Risk to self with the past 6 months Suicidal Ideation:  ("I don't know") Suicidal Intent:  ("I don't know") Is patient at risk for suicide?: No Suicidal Plan?: No Access to Means: No What has been your use of drugs/alcohol within the last 12 months?: Daily alcohol use reported.  Previous Attempts/Gestures: Yes How many times?: 1 Other Self Harm Risks: No other self harm risk identified at this time.  Triggers for Past Attempts: Unpredictable Intentional Self Injurious Behavior: None Family Suicide History: Yes ("Grandfather") Recent stressful life event(s): Financial Problems, Other (Comment) (Housing ) Persecutory voices/beliefs?: No Depression: Yes Depression Symptoms: Despondent, Insomnia, Tearfulness, Isolating, Loss of interest in usual pleasures, Feeling worthless/self pity, Feeling angry/irritable, Fatigue, Guilt Substance abuse history and/or treatment for substance abuse?: Yes Suicide prevention information given to non-admitted patients: Not applicable  Risk to Others: Risk to  Others within the past 6 months Homicidal Ideation: Yes-Currently Present Thoughts of Harm to Others: Yes-Currently Present Comment - Thoughts of Harm to Others: "I think about killing people everyday". Current Homicidal Intent: No Current Homicidal Plan: No Access to Homicidal Means: No Identified Victim: NA History of harm to others?:  No Assessment of Violence: None Noted Violent Behavior Description: No violent behaviors observed. Pt is calm and cooperative at this time.  Does patient have access to weapons?: No Criminal Charges Pending?: No Does patient have a court date: No  Abuse: Abuse/Neglect Assessment (Assessment to be complete while patient is alone) Physical Abuse: Denies Verbal Abuse: Denies Sexual Abuse: Denies Exploitation of patient/patient's resources: Denies Self-Neglect: Denies  Prior Inpatient Therapy: Prior Inpatient Therapy Prior Inpatient Therapy: Yes Prior Therapy Dates: 2006, 2013, 2015 Prior Therapy Facilty/Provider(s): [facility] Reason for Treatment: SA  Prior Outpatient Therapy: Prior Outpatient Therapy Prior Outpatient Therapy: Yes Prior Therapy Dates: 2015 Prior Therapy Facilty/Provider(s): [facility] Reason for Treatment: SA  Additional Information: Additional Information 1:1 In Past 12 Months?: No CIRT Risk: No Elopement Risk: No Does patient have medical clearance?: Yes                  Objective: Blood pressure 131/82, pulse 64, temperature 97.5 F (36.4 C), temperature source Oral, resp. rate 20, SpO2 97 %.There is no weight on file to calculate BMI. Results for orders placed or performed during the hospital encounter of 02/12/14 (from the past 72 hour(s))  Urine rapid drug screen (hosp performed)     Status: None   Collection Time: 02/12/14 11:53 PM  Result Value Ref Range   Opiates NONE DETECTED NONE DETECTED   Cocaine NONE DETECTED NONE DETECTED   Benzodiazepines NONE DETECTED NONE DETECTED   Amphetamines NONE DETECTED NONE DETECTED   Tetrahydrocannabinol NONE DETECTED NONE DETECTED   Barbiturates NONE DETECTED NONE DETECTED    Comment:        DRUG SCREEN FOR MEDICAL PURPOSES ONLY.  IF CONFIRMATION IS NEEDED FOR ANY PURPOSE, NOTIFY LAB WITHIN 5 DAYS.        LOWEST DETECTABLE LIMITS FOR URINE DRUG SCREEN Drug Class       Cutoff (ng/mL) Amphetamine       1000 Barbiturate      200 Benzodiazepine   841 Tricyclics       660 Opiates          300 Cocaine          300 THC              50   CBC with Differential     Status: Abnormal   Collection Time: 02/13/14 12:37 AM  Result Value Ref Range   WBC 7.4 4.0 - 10.5 K/uL   RBC 3.72 (L) 4.22 - 5.81 MIL/uL   Hemoglobin 12.3 (L) 13.0 - 17.0 g/dL   HCT 36.4 (L) 39.0 - 52.0 %   MCV 97.8 78.0 - 100.0 fL   MCH 33.1 26.0 - 34.0 pg   MCHC 33.8 30.0 - 36.0 g/dL   RDW 12.1 11.5 - 15.5 %   Platelets 314 150 - 400 K/uL   Neutrophils Relative % 35 (L) 43 - 77 %   Lymphocytes Relative 35 12 - 46 %   Monocytes Relative 7 3 - 12 %   Eosinophils Relative 21 (H) 0 - 5 %   Basophils Relative 2 (H) 0 - 1 %   Neutro Abs 2.6 1.7 - 7.7 K/uL   Lymphs Abs  2.6 0.7 - 4.0 K/uL   Monocytes Absolute 0.5 0.1 - 1.0 K/uL   Eosinophils Absolute 1.6 (H) 0.0 - 0.7 K/uL   Basophils Absolute 0.1 0.0 - 0.1 K/uL   RBC Morphology TARGET CELLS     Comment: TEARDROP CELLS   WBC Morphology ATYPICAL LYMPHOCYTES   Comprehensive metabolic panel     Status: Abnormal   Collection Time: 02/13/14 12:37 AM  Result Value Ref Range   Sodium 147 137 - 147 mEq/L   Potassium 3.0 (L) 3.7 - 5.3 mEq/L   Chloride 101 96 - 112 mEq/L   CO2 29 19 - 32 mEq/L   Glucose, Bld 82 70 - 99 mg/dL   BUN 8 6 - 23 mg/dL   Creatinine, Ser 0.62 0.50 - 1.35 mg/dL   Calcium 8.1 (L) 8.4 - 10.5 mg/dL   Total Protein 6.9 6.0 - 8.3 g/dL   Albumin 3.3 (L) 3.5 - 5.2 g/dL   AST 65 (H) 0 - 37 U/L   ALT 24 0 - 53 U/L   Alkaline Phosphatase 75 39 - 117 U/L   Total Bilirubin <0.2 (L) 0.3 - 1.2 mg/dL   GFR calc non Af Amer >90 >90 mL/min   GFR calc Af Amer >90 >90 mL/min    Comment: (NOTE) The eGFR has been calculated using the CKD EPI equation. This calculation has not been validated in all clinical situations. eGFR's persistently <90 mL/min signify possible Chronic Kidney Disease.    Anion gap 17 (H) 5 - 15  Lipase, blood     Status: Abnormal    Collection Time: 02/13/14 12:37 AM  Result Value Ref Range   Lipase 75 (H) 11 - 59 U/L  Ethanol     Status: Abnormal   Collection Time: 02/13/14 12:37 AM  Result Value Ref Range   Alcohol, Ethyl (B) 268 (H) 0 - 11 mg/dL    Comment:        LOWEST DETECTABLE LIMIT FOR SERUM ALCOHOL IS 11 mg/dL FOR MEDICAL PURPOSES ONLY    Labs are reviewed and are pertinent for no medical issues.  Current Facility-Administered Medications  Medication Dose Route Frequency Provider Last Rate Last Dose  . LORazepam (ATIVAN) tablet 0-4 mg  0-4 mg Oral 4 times per day Britt Bottom, NP   1 mg at 02/13/14 1025  . [START ON 02/15/2014] LORazepam (ATIVAN) tablet 0-4 mg  0-4 mg Oral Q12H Britt Bottom, NP      . nicotine (NICODERM CQ - dosed in mg/24 hours) patch 21 mg  21 mg Transdermal PRN Antonietta Breach, PA-C      . thiamine (B-1) injection 100 mg  100 mg Intravenous Daily Britt Bottom, NP      . thiamine (VITAMIN B-1) tablet 100 mg  100 mg Oral Daily Britt Bottom, NP   100 mg at 02/13/14 1025   No current outpatient prescriptions on file.    Psychiatric Specialty Exam:     Blood pressure 131/82, pulse 64, temperature 97.5 F (36.4 C), temperature source Oral, resp. rate 20, SpO2 97 %.There is no weight on file to calculate BMI.  General Appearance: Disheveled  Eye Sport and exercise psychologist::  Fair  Speech:  Normal Rate  Volume:  Normal  Mood:  Euthymic  Affect:  Congruent  Thought Process:  Coherent  Orientation:  Full (Time, Place, and Person)  Thought Content:  WDL  Suicidal Thoughts:  No  Homicidal Thoughts:  No  Memory:  Immediate;   Good Recent;   Good Remote;  Good  Judgement:  Fair  Insight:  Fair  Psychomotor Activity:  Decreased  Concentration:  Good  Recall:  Good  Fund of Knowledge:Good  Language: Good  Akathisia:  No  Handed:  Right  AIMS (if indicated):     Assets:  Leisure Time Physical Health Resilience Social Support  Sleep:      Musculoskeletal: Strength &  Muscle Tone: within normal limits Gait & Station: normal Patient leans: N/A  Treatment Plan Summary: Discharge home with a bus pass and resources for rehabs, if he decides he wants to stop drinking.  Waylan Boga, [place] 02/13/2014 10:37 AM  Patient seen, evaluated and I agree with notes by Nurse Practitioner. Corena Pilgrim, MD
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
## 2014-02-13 NOTE — ED Notes (Signed)
Pt eating breakfast.  Moving to TCU when completed.

## 2014-02-13 NOTE — ED Notes (Signed)
Bed: [id number] Expected date:  Expected time:  Means of arrival:  Comments: Room 9

## 2014-02-13 NOTE — ED Notes (Signed)
Pt is requesting medication for acid reflux. Called PA and requested orders. Safety maintained.

## 2014-02-13 NOTE — ED Notes (Signed)
Pt d/c with two bus passes. All items returned. D/C instructions given. Pt denies si and hi.

## 2014-02-13 NOTE — ED Notes (Signed)
Pt reports feeling better at this time. He currently denies si and hi. CIWA 4. No medications needed at this time.

## 2014-03-08 ENCOUNTER — Emergency Department (HOSPITAL_BASED_OUTPATIENT_CLINIC_OR_DEPARTMENT_OTHER)
Admission: EM | Admit: 2014-03-08 | Discharge: 2014-03-08 | Disposition: A | Discharge status: Home / Self Care | Payer: Self-pay | Attending: Emergency Medicine | Admitting: Emergency Medicine

## 2014-03-08 ENCOUNTER — Encounter (HOSPITAL_BASED_OUTPATIENT_CLINIC_OR_DEPARTMENT_OTHER): Payer: Self-pay | Admitting: *Deleted

## 2014-03-08 DIAGNOSIS — Z8659 Personal history of other mental and behavioral disorders: Secondary | ICD-10-CM | POA: Insufficient documentation

## 2014-03-08 DIAGNOSIS — R21 Rash and other nonspecific skin eruption: Secondary | ICD-10-CM | POA: Insufficient documentation

## 2014-03-08 DIAGNOSIS — Z8719 Personal history of other diseases of the digestive system: Secondary | ICD-10-CM | POA: Insufficient documentation

## 2014-03-08 DIAGNOSIS — Z720 Tobacco use: Secondary | ICD-10-CM | POA: Insufficient documentation

## 2014-03-08 MED ORDER — PREDNISONE 10 MG PO TABS: 10.0000 mg | ORAL_TABLET | Freq: Every day | ORAL | Status: DC

## 2014-03-08 MED ORDER — PREDNISONE 50 MG PO TABS
60.0000 mg | ORAL_TABLET | Freq: Once | ORAL | Status: AC
  Administered 2014-03-08: 60 mg via ORAL
  Filled 2014-03-08 (×2): qty 1

## 2014-03-08 NOTE — ED Notes (Addendum)
Pt brought in by EMS Amb to triage  For bil hip pain , Pt is homeless and has been sleeping in the woods. ETOH.  EMS states pt requested this facility.

## 2014-03-08 NOTE — ED Notes (Signed)
Pt has sofe on upper rt hip,  Red bruising area w itching x months
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
## 2014-03-08 NOTE — Discharge Instructions (Signed)
Contact Dermatitis °Contact dermatitis is a rash that happens when something touches the skin. You touched something that irritates your skin, or you have allergies to something you touched. °HOME CARE  °· Avoid the thing that caused your rash. °· Keep your rash away from hot water, soap, sunlight, chemicals, and other things that might bother it. °· Do not scratch your rash. °· You can take cool baths to help stop itching. °· Only take medicine as told by your doctor. °· Keep all doctor visits as told. °GET HELP RIGHT AWAY IF:  °· Your rash is not better after 3 days. °· Your rash gets worse. °· Your rash is puffy (swollen), tender, red, sore, or warm. °· You have problems with your medicine. °MAKE SURE YOU:  °· Understand these instructions. °· Will watch your condition. °· Will get help right away if you are not doing well or get worse. °Document Released: 01/05/2009 Document Revised: 06/02/2011 Document Reviewed: 08/13/2010 °Exit[employer]® Patient Information ©2015 [employer]. This information is not intended to replace advice given to you by your health care provider. Make sure you discuss any questions you have with your health care provider. ° °

## 2014-04-24 ENCOUNTER — Emergency Department (HOSPITAL_COMMUNITY)
Admission: EM | Admit: 2014-04-24 | Discharge: 2014-04-24 | Disposition: A | Discharge status: Home / Self Care | Payer: Self-pay | Attending: Emergency Medicine | Admitting: Emergency Medicine

## 2014-04-24 ENCOUNTER — Encounter (HOSPITAL_COMMUNITY): Payer: Self-pay | Admitting: Emergency Medicine

## 2014-04-24 DIAGNOSIS — Z8719 Personal history of other diseases of the digestive system: Secondary | ICD-10-CM | POA: Insufficient documentation

## 2014-04-24 DIAGNOSIS — H6093 Unspecified otitis externa, bilateral: Secondary | ICD-10-CM | POA: Insufficient documentation

## 2014-04-24 DIAGNOSIS — Z7952 Long term (current) use of systemic steroids: Secondary | ICD-10-CM | POA: Insufficient documentation

## 2014-04-24 DIAGNOSIS — Z720 Tobacco use: Secondary | ICD-10-CM | POA: Insufficient documentation

## 2014-04-24 DIAGNOSIS — R21 Rash and other nonspecific skin eruption: Secondary | ICD-10-CM | POA: Insufficient documentation

## 2014-04-24 DIAGNOSIS — Z8659 Personal history of other mental and behavioral disorders: Secondary | ICD-10-CM | POA: Insufficient documentation

## 2014-04-24 MED ORDER — PREDNISONE 5 MG PO TABS: 20.0000 mg | ORAL_TABLET | Freq: Every day | ORAL | Status: DC

## 2014-04-24 MED ORDER — PREDNISONE 20 MG PO TABS
40.0000 mg | ORAL_TABLET | Freq: Once | ORAL | Status: AC
  Administered 2014-04-24: 40 mg via ORAL
  Filled 2014-04-24: qty 2

## 2014-04-24 MED ORDER — DIPHENHYDRAMINE HCL 25 MG PO CAPS
25.0000 mg | ORAL_CAPSULE | Freq: Once | ORAL | Status: AC
  Administered 2014-04-24: 25 mg via ORAL
  Filled 2014-04-24: qty 1

## 2014-04-24 MED ORDER — NEOMYCIN-POLYMYXIN-HC 1 % OT SOLN
3.0000 [drp] | Freq: Four times a day (QID) | OTIC | Status: DC
  Administered 2014-04-24: 3 [drp] via OTIC
  Filled 2014-04-24: qty 10
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
## 2014-04-24 NOTE — ED Notes (Signed)
Pt c/o of rash to bilateal hips and buttocks x 2 months. Reports its is itchy and painful.

## 2014-04-24 NOTE — Discharge Instructions (Signed)
Call for a follow up appointment with a Family or Primary Care Provider.  Return if Symptoms worsen.   Take medication as prescribed.  Place 4 drops in both ears 3 times a day for 1 week.    Emergency Department Resource Guide 1) Find a Doctor and Pay Out of Pocket Although you won't have to find out who is covered by your insurance plan, it is a good idea to ask around and get recommendations. You will then need to call the office and see if the doctor you have chosen will accept you as a new patient and what types of options they offer for patients who are self-pay. Some doctors offer discounts or will set up payment plans for their patients who do not have insurance, but you will need to ask so you aren't surprised when you get to your appointment.  2) Contact Your Local Health Department Not all health departments have doctors that can see patients for sick visits, but many do, so it is worth a call to see if yours does. If you don't know where your local health department is, you can check in your phone book. The CDC also has a tool to help you locate your state's health department, and many state websites also have listings of all of their local health departments.  3) Find a Walk-in Clinic If your illness is not likely to be very severe or complicated, you may want to try a walk in clinic. These are popping up all over the country in pharmacies, drugstores, and shopping centers. They're usually staffed by nurse practitioners or physician assistants that have been trained to treat common illnesses and complaints. They're usually fairly quick and inexpensive. However, if you have serious medical issues or chronic medical problems, these are probably not your best option.  No Primary Care Doctor: - Call Health Connect at  [phone] - they can help you locate a primary care doctor that  accepts your insurance, provides certain services, etc. - Physician Referral Service-  [phone]  Chronic Pain Problems: Organization         Address  Phone   Notes  [facility]  [phone] Patients need to be referred by their primary care doctor.   Medication Assistance: Organization         Address  Phone   Notes  [facility] [place] Medication [facility] [address] [place], [place] [address] [phone] --Must be a resident of [facility] [place] -- Must have NO insurance coverage whatsoever (no Medicaid/ Medicare, etc.) -- The pt. MUST have a primary care doctor that directs their care regularly and follows them in the community   MedAssist  [phone]   [employer]  [phone]    Agencies that provide inexpensive medical care: Organization         Address  Phone   Notes  [facility]  [phone]   [facility]    [phone]   [facility] [address] [place], [place] [address] [phone]   [facility] [place] [address], [place] [phone]   [facility]anned Parenthood    [phone]   [facility]    [phone]) [phone]   [facility] and [facility]  [address], [place] Phone:  [phone]) [phone], Fax:  [phone]) [phone] Hours of Operation:  9 am - 6 pm, M-F.  Also accepts Medicaid/Medicare and self-pay.  [facility] for [place] [address], [address], [place] Phone: [phone], Fax: [phone]. Hours of Operation:  8:30 am - 5:30 pm, M-F.  Also accepts Medicaid and self-pay.  [facility] [place] [address] [place] Phone: [phone]   [place], [place], [place] [phone], Ext. [phone] Mondays & Thursdays: 7-9 AM.  First 15 patients are seen on a first come, first serve basis.    [place] Providers:  Organization         Address  Phone   Notes  [facility] [address],  [place] [phone] Also accepts self-pay patients.  [facility] [id number] [place], [place]  [phone]   [place], [address], [place] [phone]   [facility] [address] [place] [phone]   Lucianne Lei [address], [place]   [phone] Only accepts [place] Access [place] patients after they have their name applied to their card.   Self-Pay (no insurance) in [facility]:  Organization         Address  Phone   Notes  Sickle Cell Patients, [facility] [place] [phone]   [facility] [place] [phone]   [facility] [place]  [place], [place], [place] [phone]   [facility]/Dr. Osei-Bonsu  [address] [place] [address] [place] or [address], [place] [phone] Phone number for both [place] and [place] locations is the same.  Urgent Medical and [facility] [address], [place] [phone]   [facility] [address] [place] or [address] [phone]   [facility] [address], [place] [phone], phone; [phone], fax Sees patients 1st and 3rd Saturday of every month.  Must not qualify for public or private insurance (i.e. Medicaid, Medicare, [place] Health Choice, Veterans' Benefits)  Household income should be no more than 200% of the poverty level The clinic cannot treat you if you are pregnant or think you are pregnant  Sexually transmitted diseases are not treated at the clinic.    Dental Care: Organization         Address  Phone  Notes  [facility] of [facility] [place] [phone] Accepts children up to age 81 who are enrolled in  [place] or [place]; pregnant women with a Medicaid card; and children who have applied for Medicaid or  Health Choice, but were declined, whose parents can pay a reduced fee at time of service.  [facility] of [facility]  [address], [place] [phone] Accepts children up to age 44 who are enrolled in [place] or [place]; pregnant women with a Medicaid card; and children who have applied for Medicaid or  Health Choice, but were declined, whose parents can pay a reduced fee at time of service.  [facility]  [place] [phone] Patients are seen by appointment only. Walk-ins are not accepted. [place] will see patients 52 years of age and older. Monday - Tuesday (8am-5pm) Most Wednesdays (8:30-5pm) $30 per  visit, cash only  [facility] Adult [employer] PROGRAM  [address], [address] [phone] Patients are seen by appointment only. Walk-ins are not accepted. [place] will see patients 48 years of age and older. One Wednesday Evening (Monthly: Volunteer Based).  $30 per visit, cash only  [place]  [phone] for adults; Children under age 62, call Graduate Pediatric Dentistry at [phone]. Children aged 41-14, please call [phone] to request a pediatric application.  Dental services are provided in all areas of dental care including fillings, crowns and bridges, complete and partial dentures, implants, gum treatment, root canals, and extractions. Preventive care is also provided. Treatment is provided to both adults and children. Patients are selected via a lottery and there is often a waiting list.   [facility] [address] [place]  [phone] [email or web address]   [facility] [address] [place], [place] [phone], Ext. [phone] Second and Fourth Thursday of each month, opens at 6:30  AM; Clinic ends at 9 AM.  Patients are seen on a first-come first-served basis, and a limited number are seen during each clinic.   [facility]  [address] Hillard Danker [place], [place] [phone]   Eligibility Requirements You must have lived in [place], [place], or [place] counties for at least the last three months.   You cannot be eligible for state or federal sponsored [employer], including [employer], [place], or [employer].   You generally cannot be eligible for healthcare insurance through your employer.    How to apply: Eligibility screenings are held every Tuesday and Wednesday afternoon from 1:00 pm until 4:00 pm. You do not need an appointment for the interview!  [facility] [address], [place], [place]   [place]  [place] Department  [place]  [phone]    Behavioral Health Resources in the Community: Intensive Outpatient Programs Organization         Address  Phone  Notes  [facility] [place]. [address] [place], [place] [phone]   [facility] Outpatient [address] [place], [place]   ADS: Alcohol & Drug S[facility]cs [address] [place], [place]   [place] [address]  [place], [place] or [phone]   Substance Abuse Resources Organization         Address  Phone  Notes  Alcohol and Drug Services  [phone]   [place]  [phone]   The [place]   Chinita Pester  [phone]   Residential & Outpatient Substance Abuse Program  [phone]   Psychological Services Organization         Address  Phone  Notes  [facility] [place]  [place]  [phone]   [place] [address] [place] or  [phone]    Mobile Crisis Teams Organization         Address  Phone  Notes  [facility], Mobile Crisis Care Unit  [phone]   [facility]  [address] [place], [place]   [place] Levels [address], [place] [phone]    Self-Help/Support Groups Organization         Address  Phone  Notes  [facility] [place] - variety of support groups  [place] Call for more information  Narcotics Anonymous (NA), [facility] [address], [employer] [place]  2 meetings at this location   Special educational needs teacher         Address  Phone  Notes  [facility] Residential Treatment [place],    [place]  [phone]   [facility]  [address] [place] [id number], [place], [place]   [place], [place] [phone] Admissions: 8am-3pm M-F  Incentives Substance [place] [address]    [place], [place] [phone]   [facility] [address] [place], [place], [place]   [facility] [address],  [place], [place]   [facility] - Intensive Outpatient [address] Kristeen [address], [place], [place]   [facility] ([place] [address].,  [place], [place] [phone] or [phone]   [facility] Services (RTS) [address] [place], [place] Accepts Medicaid  [facility] [place] [address],  [place] [phone] Substance Abuse/Addiction Treatment   [facility] Organization         Address  Phone  Notes  [facility]  [phone]   Domenic Schwab, PhD [address] Arlis Porta [place], [place]   [phone] or [phone]   [place] and [place], [place] [phone]   [facility] [address],  [place], [place] [phone] Insurance/Medicaid/sponsorship through [facility] and Families [address], [address] [place]                                    [place], [place] [phone] [place] [address] [place], [place] [phone]    Dr. Adele Schilder  [phone]   [facility] Dept. 1) [address], [place] 2) [place] 3)  [place] [address], [place] [phone]  [phone]   [place] [phone] or [phone] (After Hours)

## 2014-08-03 ENCOUNTER — Emergency Department (HOSPITAL_COMMUNITY): Payer: Self-pay

## 2014-08-03 ENCOUNTER — Encounter (HOSPITAL_COMMUNITY): Payer: Self-pay | Admitting: Emergency Medicine

## 2014-08-03 ENCOUNTER — Emergency Department (HOSPITAL_COMMUNITY)
Admission: EM | Admit: 2014-08-03 | Discharge: 2014-08-04 | Disposition: A | Discharge status: Home / Self Care | Payer: Self-pay | Attending: Emergency Medicine | Admitting: Emergency Medicine

## 2014-08-03 DIAGNOSIS — Y9389 Activity, other specified: Secondary | ICD-10-CM | POA: Insufficient documentation

## 2014-08-03 DIAGNOSIS — Y998 Other external cause status: Secondary | ICD-10-CM | POA: Insufficient documentation

## 2014-08-03 DIAGNOSIS — Y9289 Other specified places as the place of occurrence of the external cause: Secondary | ICD-10-CM | POA: Insufficient documentation

## 2014-08-03 DIAGNOSIS — W01198A Fall on same level from slipping, tripping and stumbling with subsequent striking against other object, initial encounter: Secondary | ICD-10-CM | POA: Insufficient documentation

## 2014-08-03 DIAGNOSIS — Z8719 Personal history of other diseases of the digestive system: Secondary | ICD-10-CM | POA: Insufficient documentation

## 2014-08-03 DIAGNOSIS — S0081XA Abrasion of other part of head, initial encounter: Secondary | ICD-10-CM | POA: Insufficient documentation

## 2014-08-03 DIAGNOSIS — Z8659 Personal history of other mental and behavioral disorders: Secondary | ICD-10-CM | POA: Insufficient documentation

## 2014-08-03 DIAGNOSIS — E860 Dehydration: Secondary | ICD-10-CM | POA: Insufficient documentation

## 2014-08-03 DIAGNOSIS — Z720 Tobacco use: Secondary | ICD-10-CM | POA: Insufficient documentation

## 2014-08-03 DIAGNOSIS — F1010 Alcohol abuse, uncomplicated: Secondary | ICD-10-CM | POA: Insufficient documentation

## 2014-08-03 DIAGNOSIS — R569 Unspecified convulsions: Secondary | ICD-10-CM | POA: Insufficient documentation

## 2014-08-03 DIAGNOSIS — Z23 Encounter for immunization: Secondary | ICD-10-CM | POA: Insufficient documentation

## 2014-08-03 DIAGNOSIS — Z7952 Long term (current) use of systemic steroids: Secondary | ICD-10-CM | POA: Insufficient documentation

## 2014-08-03 LAB — BASIC METABOLIC PANEL
Anion gap: 19 — ABNORMAL HIGH (ref 5–15)
BUN: 13 mg/dL (ref 6–20)
CHLORIDE: 94 mmol/L — AB (ref 101–111)
CO2: 21 mmol/L — ABNORMAL LOW (ref 22–32)
Calcium: 9.2 mg/dL (ref 8.9–10.3)
Creatinine, Ser: 0.98 mg/dL (ref 0.61–1.24)
GFR calc non Af Amer: >60 mL/min (ref >60)
Glucose, Bld: 128 mg/dL — ABNORMAL HIGH (ref 65–99)
POTASSIUM: 3.9 mmol/L (ref 3.5–5.1)
Sodium: 134 mmol/L — ABNORMAL LOW (ref 135–145)

## 2014-08-03 LAB — CBC WITH DIFFERENTIAL/PLATELET
BASOS ABS: 0 10*3/uL (ref 0.0–0.1)
Basophils Relative: 0 % (ref 0–1)
EOS ABS: 0.2 10*3/uL (ref 0.0–0.7)
EOS PCT: 1 % (ref 0–5)
HCT: 37.3 % — ABNORMAL LOW (ref 39.0–52.0)
Hemoglobin: 12.8 g/dL — ABNORMAL LOW (ref 13.0–17.0)
Lymphocytes Relative: 5 % — ABNORMAL LOW (ref 12–46)
Lymphs Abs: 0.6 10*3/uL — ABNORMAL LOW (ref 0.7–4.0)
MCH: 31.9 pg (ref 26.0–34.0)
MCHC: 34.3 g/dL (ref 30.0–36.0)
MCV: 93 fL (ref 78.0–100.0)
Monocytes Absolute: 0.7 10*3/uL (ref 0.1–1.0)
Monocytes Relative: 6 % (ref 3–12)
NEUTROS PCT: 88 % — AB (ref 43–77)
Neutro Abs: 10.7 10*3/uL — ABNORMAL HIGH (ref 1.7–7.7)
Platelets: 243 10*3/uL (ref 150–400)
RBC: 4.01 MIL/uL — ABNORMAL LOW (ref 4.22–5.81)
RDW: 13.5 % (ref 11.5–15.5)
WBC: 12.1 10*3/uL — ABNORMAL HIGH (ref 4.0–10.5)

## 2014-08-03 LAB — RAPID URINE DRUG SCREEN, HOSP PERFORMED
AMPHETAMINES: NOT DETECTED
BENZODIAZEPINES: NOT DETECTED
Barbiturates: NOT DETECTED
Cocaine: NOT DETECTED
OPIATES: NOT DETECTED
TETRAHYDROCANNABINOL: NOT DETECTED

## 2014-08-03 LAB — CBG MONITORING, ED: Glucose-Capillary: 121 mg/dL — ABNORMAL HIGH (ref 65–99)

## 2014-08-03 LAB — ETHANOL: ALCOHOL ETHYL (B): 16 mg/dL — AB (ref <5)

## 2014-08-03 MED ORDER — LORAZEPAM 2 MG/ML IJ SOLN
1.0000 mg | Freq: Once | INTRAMUSCULAR | Status: AC
  Administered 2014-08-03: 1 mg via INTRAVENOUS
  Filled 2014-08-03: qty 1

## 2014-08-03 MED ORDER — TETANUS-DIPHTH-ACELL PERTUSSIS 5-2.5-18.5 LF-MCG/0.5 IM SUSP
0.5000 mL | Freq: Once | INTRAMUSCULAR | Status: AC
  Administered 2014-08-03: 0.5 mL via INTRAMUSCULAR
  Filled 2014-08-03: qty 0.5

## 2014-08-03 MED ORDER — SODIUM CHLORIDE 0.9 % IV BOLUS (SEPSIS)
1000.0000 mL | Freq: Once | INTRAVENOUS | Status: AC
  Administered 2014-08-04: 1000 mL via INTRAVENOUS
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
## 2014-08-03 NOTE — ED Notes (Signed)
[facility] presents with a 59 yo homeless male with apparent seizure; Bystanders in area of pt witnessed generalized seizure-like activity for approximately 3 minutes.  Pt struck head on wall that he was leaning on prior to seizure and sustained abrasion to right side of forehead.  No oral trauma or incontinence.  No other obvious signs of injury.  Upon [facility] arrival, pt was alert but lethargic and currently alert and oriented X4.  Pt admits to drinking a 12 pk of beer today.

## 2014-08-03 NOTE — ED Notes (Signed)
Bed: WA06 Expected date: 08/03/14 Expected time: 9:31 PM Means of arrival: Ambulance Comments: Seizure like activity, etoh, homeless

## 2014-08-04 MED ORDER — SODIUM CHLORIDE 0.9 % IV BOLUS (SEPSIS)
1000.0000 mL | Freq: Once | INTRAVENOUS | Status: AC
  Administered 2014-08-04: 1000 mL via INTRAVENOUS

## 2014-08-04 NOTE — Discharge Instructions (Signed)
Alcohol Intoxication  Alcohol intoxication occurs when the amount of alcohol that a person has consumed impairs his or her ability to mentally and physically function. Alcohol directly impairs the normal chemical activity of the brain. Drinking large amounts of alcohol can lead to changes in mental function and behavior, and it can cause many physical effects that can be harmful.   Alcohol intoxication can range in severity from mild to very severe. Various factors can affect the level of intoxication that occurs, such as the person's age, gender, weight, frequency of alcohol consumption, and the presence of other medical conditions (such as diabetes, seizures, or heart conditions). Dangerous levels of alcohol intoxication may occur when people drink large amounts of alcohol in a short period (binge drinking). Alcohol can also be especially dangerous when combined with certain prescription medicines or "recreational" drugs.  SIGNS AND SYMPTOMS  Some common signs and symptoms of mild alcohol intoxication include:  · Loss of coordination.  · Changes in mood and behavior.  · Impaired judgment.  · Slurred speech.  As alcohol intoxication progresses to more severe levels, other signs and symptoms will appear. These may include:  · Vomiting.  · Confusion and impaired memory.  · Slowed breathing.  · Seizures.  · Loss of consciousness.  DIAGNOSIS   Your health care provider will take a medical history and perform a physical exam. You will be asked about the amount and type of alcohol you have consumed. Blood tests will be done to measure the concentration of alcohol in your blood. In many places, your blood alcohol level must be lower than 80 mg/dL (0.08%) to legally drive. However, many dangerous effects of alcohol can occur at much lower levels.   TREATMENT   People with alcohol intoxication often do not require treatment. Most of the effects of alcohol intoxication are temporary, and they go away as the alcohol naturally  leaves the body. Your health care provider will monitor your condition until you are stable enough to go home. Fluids are sometimes given through an IV access tube to help prevent dehydration.   HOME CARE INSTRUCTIONS  · Do not drive after drinking alcohol.  · Stay hydrated. Drink enough water and fluids to keep your urine clear or pale yellow. Avoid caffeine.    · Only take over-the-counter or prescription medicines as directed by your health care provider.    SEEK MEDICAL CARE IF:   · You have persistent vomiting.    · You do not feel better after a few days.  · You have frequent alcohol intoxication. Your health care provider can help determine if you should see a substance use treatment counselor.  SEEK IMMEDIATE MEDICAL CARE IF:   · You become shaky or tremble when you try to stop drinking.    · You shake uncontrollably (seizure).    · You throw up (vomit) blood. This may be bright red or may look like black coffee grounds.    · You have blood in your stool. This may be bright red or may appear as a black, tarry, bad smelling stool.    · You become lightheaded or faint.    MAKE SURE YOU:   · Understand these instructions.  · Will watch your condition.  · Will get help right away if you are not doing well or get worse.  Document Released: 12/18/2004 Document Revised: 11/10/2012 Document Reviewed: 08/13/2012  ExitCare® Patient Information ©2015 [employer]. This information is not intended to replace advice given to you by your health care provider. Make sure   you discuss any questions you have with your health care provider.

## 2014-08-04 NOTE — ED Notes (Signed)
Pt up alert and eating a Malaw[place]key sandwich and drinking a coke.

## 2014-08-11 ENCOUNTER — Emergency Department (HOSPITAL_COMMUNITY)
Admission: EM | Admit: 2014-08-11 | Discharge: 2014-08-11 | Disposition: A | Discharge status: Home / Self Care | Payer: Self-pay | Attending: Emergency Medicine | Admitting: Emergency Medicine

## 2014-08-11 ENCOUNTER — Encounter (HOSPITAL_COMMUNITY): Payer: Self-pay | Admitting: Emergency Medicine

## 2014-08-11 DIAGNOSIS — F10920 Alcohol use, unspecified with intoxication, uncomplicated: Secondary | ICD-10-CM

## 2014-08-11 DIAGNOSIS — F10230 Alcohol dependence with withdrawal, uncomplicated: Secondary | ICD-10-CM | POA: Diagnosis present

## 2014-08-11 DIAGNOSIS — F10220 Alcohol dependence with intoxication, uncomplicated: Secondary | ICD-10-CM | POA: Insufficient documentation

## 2014-08-11 DIAGNOSIS — F10120 Alcohol abuse with intoxication, uncomplicated: Secondary | ICD-10-CM | POA: Diagnosis present

## 2014-08-11 DIAGNOSIS — Z8719 Personal history of other diseases of the digestive system: Secondary | ICD-10-CM | POA: Insufficient documentation

## 2014-08-11 DIAGNOSIS — Z720 Tobacco use: Secondary | ICD-10-CM | POA: Insufficient documentation

## 2014-08-11 LAB — RAPID URINE DRUG SCREEN, HOSP PERFORMED
Amphetamines: NOT DETECTED
BARBITURATES: NOT DETECTED
Benzodiazepines: NOT DETECTED
COCAINE: NOT DETECTED
Opiates: NOT DETECTED
Tetrahydrocannabinol: NOT DETECTED

## 2014-08-11 LAB — CBC
HCT: 37.7 % — ABNORMAL LOW (ref 39.0–52.0)
Hemoglobin: 13 g/dL (ref 13.0–17.0)
MCH: 32.8 pg (ref 26.0–34.0)
MCHC: 34.5 g/dL (ref 30.0–36.0)
MCV: 95.2 fL (ref 78.0–100.0)
PLATELETS: 295 10*3/uL (ref 150–400)
RBC: 3.96 MIL/uL — AB (ref 4.22–5.81)
RDW: 13.8 % (ref 11.5–15.5)
WBC: 5.3 10*3/uL (ref 4.0–10.5)

## 2014-08-11 LAB — COMPREHENSIVE METABOLIC PANEL
ALBUMIN: 3.8 g/dL (ref 3.5–5.0)
ALK PHOS: 69 U/L (ref 38–126)
ALT: 18 U/L (ref 17–63)
AST: 29 U/L (ref 15–41)
Anion gap: 11 (ref 5–15)
BILIRUBIN TOTAL: 0.5 mg/dL (ref 0.3–1.2)
BUN: <5 mg/dL — ABNORMAL LOW (ref 6–20)
CHLORIDE: 98 mmol/L — AB (ref 101–111)
CO2: 25 mmol/L (ref 22–32)
Calcium: 8.9 mg/dL (ref 8.9–10.3)
Creatinine, Ser: 0.53 mg/dL — ABNORMAL LOW (ref 0.61–1.24)
Glucose, Bld: 89 mg/dL (ref 65–99)
POTASSIUM: 3.7 mmol/L (ref 3.5–5.1)
Sodium: 134 mmol/L — ABNORMAL LOW (ref 135–145)
Total Protein: 7.9 g/dL (ref 6.5–8.1)

## 2014-08-11 LAB — ETHANOL: ALCOHOL ETHYL (B): 111 mg/dL — AB (ref <5)

## 2014-08-11 LAB — SALICYLATE LEVEL: Salicylate Lvl: <4 mg/dL (ref 2.8–30.0)

## 2014-08-11 LAB — ACETAMINOPHEN LEVEL: Acetaminophen (Tylenol), Serum: <10 ug/mL — ABNORMAL LOW (ref 10–30)

## 2014-08-11 MED ORDER — LORAZEPAM 1 MG PO TABS: 0.0000 mg | ORAL_TABLET | Freq: Two times a day (BID) | ORAL | Status: DC

## 2014-08-11 MED ORDER — ONDANSETRON HCL 4 MG PO TABS: 4.0000 mg | ORAL_TABLET | Freq: Three times a day (TID) | ORAL | Status: DC | PRN

## 2014-08-11 MED ORDER — LORAZEPAM 1 MG PO TABS: 0.0000 mg | ORAL_TABLET | Freq: Four times a day (QID) | ORAL | Status: DC

## 2014-08-11 MED ORDER — NICOTINE 21 MG/24HR TD PT24
21.0000 mg | MEDICATED_PATCH | Freq: Every day | TRANSDERMAL | Status: DC
  Administered 2014-08-11: 21 mg via TRANSDERMAL
  Filled 2014-08-11: qty 1

## 2014-08-11 NOTE — ED Notes (Signed)
Patient states he is suicidal, when asked if he has a plan he states "suicidal", when asked to clarify he states "any way I can". Patient declares he is homeless and wants his thorazine. Patient states he is an alcoholic and drinks beer, "as much as I can get, when I can get it". Patient unable to quantify today's intake. Patient shouting at staff, patient threatening to expose himself to staff when asked to change into scrubs.

## 2014-08-11 NOTE — BH Assessment (Signed)
[facility] Assessment Progress Note  Per Thedore MinsMojeed Akintayo, MD this pt does not require psychiatric hospitalization at this time.  [place]  Referral information for [facility] has been included in his discharge instructions.  [place].  [name] nurse, Minerva AreolaEric, has been given [place] bus pass, and Child psychotherapistsocial worker has been contacted to request PART fare, which she agrees to bring to this Clinical research associatewriter; it is pending as of this writing.  This pt is under voluntary status.  Doylene Canninghomas Chanequa Spees, MA Triage Specialist [phone]

## 2014-08-11 NOTE — Discharge Instructions (Addendum)
For your ongoing behavioral health needs, you are advised to follow up with [facility]:       [facility]      [address]      [place], [place] [address]       [phone]  For your shelter needs, contact [facility]:       [facility]      [address]      [place], [place] [address]      [phone]

## 2014-08-11 NOTE — Progress Notes (Addendum)
CM spoke with pt who [place] [facility] [place]. CM discussed and provided written information for self pay pcps vs EDPs, importance of pcp for f/u care, www.needymeds.org, www.goodrx.com, discounted pharmacies and other [place] resources such as [facility] , [facility], affordable care act,  financial assistance, self pay dental services, [place] med assist, DSS and  health department  Reviewed resources for [place] self pay pcps like [facility], family medicine at [address], [facility], general medical clinics, [facility] urgent care plus others, medication resources, [facility] out patient pharmacies and housing Pt voiced understanding and appreciation of resources provided   Provided [facility] contact information Pt given a list of shelters for [place] Pt stated "[place] if you give me a list of shelters" [facility] RN & TTS staff member, Tom aware
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
## 2014-08-11 NOTE — ED Notes (Signed)
Pt presents with SI, no specific plan.  Pt is homeless with auditory hallucinations.  Denies HI, feeling hopeless.  Pt reports he drinks all he can get his hand on and reports using Acid years ago messed him up.  Pt admits to history of Bipolar DO, Schizophrenia and Depression.  AAO x 3, no distress noted, monitoring for safety, Q 15 min checks in effect.

## 2014-08-11 NOTE — ED Notes (Signed)
Patient yelling and cursing at staff, states I just need a place to sleep. Patient states "send me to jail, I'll get more sleep there, hell I could get more sleep on the sidewalk". Patient redirected to stop cursing at staff. Patient is uncooperative at this time.
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
## 2014-08-11 NOTE — ED Notes (Signed)
[facility] Expected date:  Expected time:  Means of arrival:  Comments: tr3
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
## 2014-08-11 NOTE — [facility] Suicide Risk Assessment (Signed)
Suicide Risk Assessment  Discharge Assessment   [facility]   Demographic Factors:  Male and Caucasian  Total Time spent with patient: 45 minutes  Musculoskeletal: Strength & Muscle Tone: within normal limits Gait & Station: normal Patient leans: N/A  Psychiatric Specialty Exam: Physical Exam  Review of Systems  Constitutional: Negative.   HENT: Negative.   Eyes: Negative.   Respiratory: Negative.   Cardiovascular: Negative.   Gastrointestinal: Negative.   Genitourinary: Negative.   Musculoskeletal: Negative.   Skin: Negative.   Neurological: Negative.   Endo/Heme/Allergies: Negative.   Psychiatric/Behavioral: Positive for substance abuse.    Blood pressure 118/70, pulse 80, temperature 98.9 F (37.2 C), temperature source Oral, resp. rate 18, SpO2 98 %.There is no weight on file to calculate BMI.  General Appearance: Casual  Eye Contact::  Good  Speech:  Normal Rate  Volume:  Normal  Mood:  Euthymic  Affect:  Congruent  Thought Process:  Coherent  Orientation:  Full (Time, Place, and Person)  Thought Content:  WDL  Suicidal Thoughts:  No  Homicidal Thoughts:  No  Memory:  Immediate;   Good Recent;   Good Remote;   Good  Judgement:  Fair  Insight:  Good  Psychomotor Activity:  Normal  Concentration:  Good  Recall:  Good  Fund of Knowledge:Good  Language: Good  Akathisia:  No  Handed:  Right  AIMS (if indicated):     Assets:  Leisure Time Physical Health Resilience  ADL's:  Intact  Cognition: WNL  Sleep:         Has this patient used any form of tobacco in the last 30 days? (Cigarettes, Smokeless Tobacco, Cigars, and/or Pipes) Yes, A prescription for an FDA-approved tobacco cessation medication was offered at discharge and the patient refused  Mental Status Per Nursing Assessment::   On Admission:   alcohol intoxication  Current Mental Status by Physician: NA  Loss Factors: NA  Historical Factors: NA  Risk  Reduction Factors:   Positive coping skills or problem solving skills  Continued Clinical Symptoms:  Alcohol, chronic use  Cognitive Features That Contribute To Risk:  None    Suicide Risk:  Minimal: No identifiable suicidal ideation.  Patients presenting with no risk factors but with morbid ruminations; may be classified as minimal risk based on the severity of the depressive symptoms  Principal Problem: Alcohol dependence with uncomplicated withdrawal Discharge Diagnoses:  Patient Active Problem List   Diagnosis Date Noted  . Alcohol dependence with uncomplicated withdrawal [F10.230] 08/11/2014    Priority: High  . Alcohol abuse [F10.10] 12/05/2013    Priority: High  . Alcohol abuse with intoxication, uncomplicated [F10.120] 10/04/2013    Priority: High  . Alcohol withdrawal [F10.239] 09/01/2013    Priority: High  . Bipolar disorder, unspecified [F31.9] 08/19/2013    Priority: High  . Alcohol dependence [F10.20] 10/23/2011    Priority: High  . S/P alcohol detoxification [Z09] 12/01/2013  . Seizure [R56.9] 08/24/2013  . Tobacco use disorder [Z72.0] 08/19/2013  . Malnutrition of moderate degree [E44.0] 08/17/2013  . Biliary colic [K80.50] 08/16/2013  . Cannabis abuse [F12.10] 10/23/2011      Plan Of Care/Follow-up recommendations:  Activity:  as tolerated Diet:  heart healthy diet  Is patient on multiple antipsychotic therapies at discharge:  No   Has Patient had three or more failed trials of antipsychotic monotherapy by history:  No  Recommended Plan for Multiple Antipsychotic Therapies: NA    LORD, JAMISON, PMH-NP 08/11/2014, 11:27 AM

## 2014-09-03 ENCOUNTER — Emergency Department (HOSPITAL_COMMUNITY)
Admission: EM | Admit: 2014-09-03 | Discharge: 2014-09-04 | Disposition: A | Discharge status: Home / Self Care | Payer: Self-pay | Attending: Emergency Medicine | Admitting: Emergency Medicine

## 2014-09-03 ENCOUNTER — Encounter (HOSPITAL_COMMUNITY): Payer: Self-pay | Admitting: Emergency Medicine

## 2014-09-03 DIAGNOSIS — R109 Unspecified abdominal pain: Secondary | ICD-10-CM

## 2014-09-03 DIAGNOSIS — Z8719 Personal history of other diseases of the digestive system: Secondary | ICD-10-CM | POA: Insufficient documentation

## 2014-09-03 DIAGNOSIS — G8929 Other chronic pain: Secondary | ICD-10-CM | POA: Insufficient documentation

## 2014-09-03 DIAGNOSIS — Z720 Tobacco use: Secondary | ICD-10-CM | POA: Insufficient documentation

## 2014-09-03 DIAGNOSIS — R1084 Generalized abdominal pain: Secondary | ICD-10-CM | POA: Insufficient documentation

## 2014-09-03 DIAGNOSIS — Z9049 Acquired absence of other specified parts of digestive tract: Secondary | ICD-10-CM | POA: Insufficient documentation

## 2014-09-03 DIAGNOSIS — Z8711 Personal history of peptic ulcer disease: Secondary | ICD-10-CM | POA: Insufficient documentation

## 2014-09-03 DIAGNOSIS — F1010 Alcohol abuse, uncomplicated: Secondary | ICD-10-CM | POA: Insufficient documentation

## 2014-09-03 MED ORDER — SODIUM CHLORIDE 0.9 % IV BOLUS (SEPSIS)
1000.0000 mL | Freq: Once | INTRAVENOUS | Status: AC
Start: 2014-09-03 — End: 2014-09-04
  Administered 2014-09-03: 1000 mL via INTRAVENOUS

## 2014-09-03 MED ORDER — ONDANSETRON HCL 4 MG/2ML IJ SOLN
4.0000 mg | Freq: Once | INTRAMUSCULAR | Status: AC
  Administered 2014-09-03: 4 mg via INTRAVENOUS
  Filled 2014-09-03: qty 2

## 2014-09-03 MED ORDER — DICYCLOMINE HCL 10 MG/ML IM SOLN
20.0000 mg | Freq: Once | INTRAMUSCULAR | Status: AC
  Administered 2014-09-03: 20 mg via INTRAMUSCULAR
  Filled 2014-09-03: qty 2
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
## 2014-09-03 NOTE — ED Notes (Signed)
Pt reports abdominal pain since 1991.

## 2014-09-03 NOTE — ED Notes (Signed)
Bed: DI26 Expected date:  Expected time:  Means of arrival:  Comments: EMS 58yo M abd pain (chronic) worse after ETOH and pizza

## 2014-09-04 NOTE — ED Notes (Signed)
Pt yelling and cussing at RN b/c pt being discharged. Pt walked out and refused to sign and refused to take discharge paperwork.

## 2014-09-04 NOTE — Discharge Instructions (Signed)
°Emergency Department Resource Guide °1) Find a Doctor and Pay Out of Pocket °Although you won't have to find out who is covered by your insurance plan, it is a good idea to ask around and get recommendations. You will then need to call the office and see if the doctor you have chosen will accept you as a new patient and what types of options they offer for patients who are self-pay. Some doctors offer discounts or will set up payment plans for their patients who do not have insurance, but you will need to ask so you aren't surprised when you get to your appointment. ° °2) Contact Your Local Health Department °Not all health departments have doctors that can see patients for sick visits, but many do, so it is worth a call to see if yours does. If you don't know where your local health department is, you can check in your phone book. The CDC also has a tool to help you locate your state's health department, and many state websites also have listings of all of their local health departments. ° °3) Find a Walk-in Clinic °If your illness is not likely to be very severe or complicated, you may want to try a walk in clinic. These are popping up all over the country in pharmacies, drugstores, and shopping centers. They're usually staffed by nurse practitioners or physician assistants that have been trained to treat common illnesses and complaints. They're usually fairly quick and inexpensive. However, if you have serious medical issues or chronic medical problems, these are probably not your best option. ° °No Primary Care Doctor: °- Call [facility] at  832-8000 - they can help you locate a primary care doctor that  accepts your insurance, provides certain services, etc. °- Physician Referral Service- 1-800-533-3463 ° °Chronic Pain Problems: °Organization         Address  Phone   Notes  °[facility]  (336) 297-2271 Patients need to be referred by their primary care doctor.  ° °Medication  Assistance: °Organization         Address  Phone   Notes  °[place] [facility] [address] °[place], [place] [address] (336) 641-8030 --Must be a resident of [place] °-- Must have NO insurance coverage whatsoever (no Medicaid/ Medicare, etc.) °-- The pt. MUST have a primary care doctor that directs their care regularly and follows them in the community °  °[facility]  (866) 331-1348   °[employer]  (888) 892-1162   ° °Agencies that provide inexpensive medical care: °Organization         Address  Phone   Notes  °[facility]  (336) 832-8035   °[facility]    (336) 832-7272   °[facility] [address] °[place], [place] [address] (336) 832-4777   °[facility] [address], °[place] (336) 271-4999   °[facility]    (336) 373-0678   °[facility]    (336) 272-1050   °[facility] ° [address], [place] Phone:  (336) 832-4444, Fax:  (336) 832-4440 Hours of Operation:  9 am - 6 pm, M-F.  Also accepts Medicaid/Medicare and self-pay.  °[facility] ° [address], [place] Phone: (336) 832-3150, Fax: (336) 832-3151. Hours of Operation:  8:30 am - 5:30 pm, M-F.  Also accepts Medicaid and self-pay.  °[facility] [place] [address], [place] Phone: (336) 878-6027   °[facility] [address], [place], [place] (336)723-1848, Ext. 123 Mondays & Thursdays: 7-9 AM.  First 15 patients are seen on a first come, first serve basis. °  ° °Medicaid-accepting [place] Providers: ° °Organization         Address  Phone   Notes  °[facility] [address], [place] (336) 641-2100 Also accepts self-pay patients.  °[facility] [address], [place] ° (336) 856-9996   °[facility] [address], [place]  (336) 288-8857   °[facility] [address] [place] [address], [place] (336) 299-7000   °Veita Bland [address], [place]  ° (336) 373-1557 Only accepts [place] Access Medicaid patients after they have their name applied to their card.  ° °Self-Pay (no insurance) in [place]: ° °Organization         Address  Phone   Notes  °Sickle Cell Patients, [facility] [address], [place] (336) 832-1970   °[facility] [address], [place] (336) 832-4400   °[facility] ° [address], [place] (336) 992-4800   °[facility]/Dr. Osei-Bonsu ° [address] [place] [address], [place] or [address], [place] (336) 841-8500 Phone number for both [place] and [place] locations is the same.  °[facility] [address], [place] (336) 299-0000   °[facility] [address] [place] [address], [place] or [address] (336) 852-7530 °(336) 878-2260   °[facility] [address], [place] (336) 350-1642, phone; (336) 294-5005, fax Sees patients 1st and 3rd Saturday of every month.  Must not qualify for public or private insurance (i.e. Medicaid, Medicare, [place] Health Choice, Veterans' Benefits) • Household income should be no more than 200% of the poverty level •The clinic cannot treat you if you are pregnant or think you are pregnant • Sexually transmitted diseases are not treated at the clinic.  ° ° °Dental Care: °Organization         Address  Phone  Notes  °[place] [facility] [address], [place] (336) 641-6152 Accepts children up to age 21 who are enrolled in Medicaid or [place] Health Choice; pregnant women with a Medicaid card; and children who have applied for Medicaid or [place] Health Choice, but were declined, whose parents can pay a reduced fee at time of service.  °[place]  [facility] [place]  [address], [place] (336) 641-7733 Accepts children up to age 21 who are enrolled in Medicaid or [place] Health Choice; pregnant women with a Medicaid card; and children who have applied for Medicaid or  Health Choice, but were declined, whose parents can pay a reduced fee at time of service.  °[facility] ° [address], [place] (336) 641-4533 Patients are seen by appointment only. Walk-ins are not accepted. [facility] will see patients 18 years of age and older. °Monday - Tuesday (8am-5pm) °Most Wednesdays (8:30-5pm) °$30 per visit, cash only  °[facility] ° [address], [place] (336) 641-4533 Patients are seen by appointment only. Walk-ins are not accepted. [facility] will see patients 18 years of age and older. °One   Wednesday Evening (Monthly: Volunteer Based).  $30 per visit, cash only  °[facility]  (919) 537-3737 for adults; Children under age 4, call Graduate Pediatric Dentistry at (919) [phone]. Children aged 4-14, please call (919) [phone] to request a pediatric application. ° Dental services are provided in all areas of dental care including fillings, crowns and bridges, complete and partial dentures, implants, gum treatment, root canals, and extractions. Preventive care is also provided. Treatment is provided to both adults and children. °Patients are selected via a lottery and there is often a waiting list. °  °[facility] [address], °[place] ° (336) 763-8833 [email or web address] °  °[facility] [address], [place], [place] (336)723-1848, Ext. 123 Second and Fourth Thursday of each month, opens at 6:30 AM; Clinic ends at 9 AM.  Patients are seen on a first-come first-served basis, and a limited number are seen during each clinic.  ° °[facility] ° [address], [place], [place] (336) 723-7904    Eligibility Requirements °You must have lived in [name], or [place] counties for at least the last three months. °  You cannot be eligible for state or federal sponsored healthcare insurance, including Veterans Administration, Medicaid, or Medicare. °  You generally cannot be eligible for healthcare insurance through your employer.  °  How to apply: °Eligibility screenings are held every Tuesday and Wednesday afternoon from 1:00 pm until 4:00 pm. You do not need an appointment for the interview!  °[facility] [address], [place], [place] 336-631-2330   °[facility]  336-342-8273   °[name]  336-703-3100   °[facility]  336-570-6415   ° °Behavioral Health Resources in the Community: °Intensive Outpatient Programs °Organization         Address  Phone  Notes  °[facility] [address], [place], [place] 336-878-6098   °[facility] [address], [place], [place] 336-832-9800   °[facility] [address], [place], [place] ° 336-882-2125   °[facility] [address],  °[place], [place] 1-800-853-5163 or 336-641-4981   °Substance Abuse Resources °Organization         Address  Phone  Notes  °[facility]  336-882-2125   °[facility]  336-784-9470   °[facility]  336-285-9073   °[facility]  336-845-3988   °Residential & Outpatient Substance Abuse Program  1-800-659-3381   °Psychological Services °Organization         Address  Phone  Notes  °[facility]  336- 832-9600   °[facility]  336- 378-7881   °[facility] [address], [place] 1-800-853-5163 or 336-641-4981   ° °Mobile Crisis Teams °Organization         Address  Phone  Notes  °[facility], Mobile Crisis Care Unit  1-877-626-1772   °Assertive °[facility] ° [address]  [place], [place] 336-834-9664   °Sharon DeEsch [address] °[place]  336-554-5454   ° °Self-Help/Support Groups °Organization         Address  Phone             Notes  °[facility] [place] - variety of support groups  336- 373-1402 Call for more information  °Narcotics Anonymous (NA), [facility] [address], °[place]   2 meetings at this location  ° °  Residential Treatment Programs °Organization         Address  Phone  Notes  °[facility] [address],    °[place]  1-866-801-8205   °[facility] ° [address], [place], [place] 704-293-8524   °[facility] [address], [place] 336-845-3988 Admissions: 8am-3pm M-F  °[facility] [address]    °[place], [place] 336-841-1104   °[facility] [address], [place], [place] 336-379-7146   °[facility] [address]  °[place], [place] 336-285-9073   °[facility] Intensive Outpatient [address], [place], [place] 336-852-3033   °[facility] [address]  °[place], [place] 1-877-615-2722 or 336-784-9470   °[facility] [address] [place], [place] 336-227-7417 Accepts Medicaid  °[facility] [address]  °[place] 1-800-659-3381 Substance Abuse/Addiction Treatment  ° °[place] Behavioral Health Resources °Organization         Address  Phone  Notes  °[facility]  (888) 581-9988   °Julie Brannon, PhD [address] [place], [place]   (336) 349-5553 or (336) 951-0000   °[facility]   [address] °[place], [place] (336) 349-4454   °[facility] [address], [place], [place] (336) 342-8316 Insurance/Medicaid/sponsorship through [facility]  °[facility] [address]                                    [place], [place] (336) 342-8316 Therapy/tele-psych/case    °[facility] [address]  ° [place],  (336) 349-2233    °Dr. Arfeen  (336) 349-4544   °[facility] [place]  [employer] [place] [facility] 1) [address], [place] °2) [address], [place] °3)  [address], [place] (336) 349-3220 °(336) 342-7768 ° °(336) 342-8140   °[place] [facility] (336) 342-1394 or (336) 342-3537 (After Hours)    ° ° °

## 2014-09-06 ENCOUNTER — Encounter (HOSPITAL_COMMUNITY): Payer: Self-pay | Admitting: *Deleted

## 2014-09-06 ENCOUNTER — Inpatient Hospital Stay (HOSPITAL_COMMUNITY)
Admission: EM | Admit: 2014-09-06 | Discharge: 2014-09-06 | DRG: 379 | Discharge status: Against Medical Advice | Payer: Self-pay | Attending: Internal Medicine | Admitting: Internal Medicine

## 2014-09-06 DIAGNOSIS — Z720 Tobacco use: Secondary | ICD-10-CM | POA: Diagnosis present

## 2014-09-06 DIAGNOSIS — F1099 Alcohol use, unspecified with unspecified alcohol-induced disorder: Secondary | ICD-10-CM | POA: Diagnosis present

## 2014-09-06 DIAGNOSIS — F10229 Alcohol dependence with intoxication, unspecified: Secondary | ICD-10-CM | POA: Diagnosis present

## 2014-09-06 DIAGNOSIS — F1020 Alcohol dependence, uncomplicated: Secondary | ICD-10-CM | POA: Diagnosis present

## 2014-09-06 DIAGNOSIS — K922 Gastrointestinal hemorrhage, unspecified: Principal | ICD-10-CM | POA: Diagnosis present

## 2014-09-06 DIAGNOSIS — F10929 Alcohol use, unspecified with intoxication, unspecified: Secondary | ICD-10-CM | POA: Insufficient documentation

## 2014-09-06 DIAGNOSIS — F1010 Alcohol abuse, uncomplicated: Secondary | ICD-10-CM | POA: Diagnosis present

## 2014-09-06 DIAGNOSIS — F10120 Alcohol abuse with intoxication, uncomplicated: Secondary | ICD-10-CM | POA: Diagnosis present

## 2014-09-06 DIAGNOSIS — F10920 Alcohol use, unspecified with intoxication, uncomplicated: Secondary | ICD-10-CM

## 2014-09-06 DIAGNOSIS — F319 Bipolar disorder, unspecified: Secondary | ICD-10-CM | POA: Diagnosis present

## 2014-09-06 DIAGNOSIS — Y906 Blood alcohol level of 120-199 mg/100 ml: Secondary | ICD-10-CM | POA: Diagnosis present

## 2014-09-06 DIAGNOSIS — F17210 Nicotine dependence, cigarettes, uncomplicated: Secondary | ICD-10-CM | POA: Diagnosis present

## 2014-09-06 DIAGNOSIS — Z5900 Homelessness unspecified: Secondary | ICD-10-CM

## 2014-09-06 DIAGNOSIS — F1910 Other psychoactive substance abuse, uncomplicated: Secondary | ICD-10-CM | POA: Diagnosis present

## 2014-09-06 DIAGNOSIS — K279 Peptic ulcer, site unspecified, unspecified as acute or chronic, without hemorrhage or perforation: Secondary | ICD-10-CM

## 2014-09-06 DIAGNOSIS — F209 Schizophrenia, unspecified: Secondary | ICD-10-CM | POA: Insufficient documentation

## 2014-09-06 DIAGNOSIS — E876 Hypokalemia: Secondary | ICD-10-CM | POA: Diagnosis present

## 2014-09-06 DIAGNOSIS — K219 Gastro-esophageal reflux disease without esophagitis: Secondary | ICD-10-CM | POA: Diagnosis present

## 2014-09-06 DIAGNOSIS — F17200 Nicotine dependence, unspecified, uncomplicated: Secondary | ICD-10-CM | POA: Diagnosis present

## 2014-09-06 DIAGNOSIS — K921 Melena: Secondary | ICD-10-CM

## 2014-09-06 DIAGNOSIS — Z8711 Personal history of peptic ulcer disease: Secondary | ICD-10-CM

## 2014-09-06 DIAGNOSIS — F3170 Bipolar disorder, currently in remission, most recent episode unspecified: Secondary | ICD-10-CM

## 2014-09-06 HISTORY — DX: Peptic ulcer, site unspecified, unspecified as acute or chronic, without hemorrhage or perforation: K27.9

## 2014-09-06 LAB — COMPREHENSIVE METABOLIC PANEL
ALBUMIN: 2.9 g/dL — AB (ref 3.5–5.0)
ALK PHOS: 52 U/L (ref 38–126)
ALT: 15 U/L — ABNORMAL LOW (ref 17–63)
AST: 28 U/L (ref 15–41)
Anion gap: 10 (ref 5–15)
BILIRUBIN TOTAL: 0.3 mg/dL (ref 0.3–1.2)
BUN: 8 mg/dL (ref 6–20)
CHLORIDE: 100 mmol/L — AB (ref 101–111)
CO2: 27 mmol/L (ref 22–32)
Calcium: 9.2 mg/dL (ref 8.9–10.3)
Creatinine, Ser: 0.64 mg/dL (ref 0.61–1.24)
GFR calc Af Amer: >60 mL/min (ref >60)
GFR calc non Af Amer: >60 mL/min (ref >60)
Glucose, Bld: 91 mg/dL (ref 65–99)
Potassium: 2.5 mmol/L — CL (ref 3.5–5.1)
Sodium: 137 mmol/L (ref 135–145)
Total Protein: 6.3 g/dL — ABNORMAL LOW (ref 6.5–8.1)

## 2014-09-06 LAB — CBC WITH DIFFERENTIAL/PLATELET
Basophils Absolute: 0 10*3/uL (ref 0.0–0.1)
Basophils Relative: 1 % (ref 0–1)
EOS ABS: 0.1 10*3/uL (ref 0.0–0.7)
EOS PCT: 2 % (ref 0–5)
HCT: 22.9 % — ABNORMAL LOW (ref 39.0–52.0)
Hemoglobin: 7.3 g/dL — ABNORMAL LOW (ref 13.0–17.0)
LYMPHS PCT: 35 % (ref 12–46)
Lymphs Abs: 2 10*3/uL (ref 0.7–4.0)
MCH: 29.1 pg (ref 26.0–34.0)
MCHC: 31.9 g/dL (ref 30.0–36.0)
MCV: 91.2 fL (ref 78.0–100.0)
Monocytes Absolute: 0.4 10*3/uL (ref 0.1–1.0)
Monocytes Relative: 6 % (ref 3–12)
Neutro Abs: 3.2 10*3/uL (ref 1.7–7.7)
Neutrophils Relative %: 56 % (ref 43–77)
PLATELETS: 489 10*3/uL — AB (ref 150–400)
RBC: 2.51 MIL/uL — ABNORMAL LOW (ref 4.22–5.81)
RDW: 14.3 % (ref 11.5–15.5)
WBC: 5.8 10*3/uL (ref 4.0–10.5)

## 2014-09-06 LAB — HEMOGLOBIN AND HEMATOCRIT, BLOOD
HEMATOCRIT: 21.9 % — AB (ref 39.0–52.0)
HEMOGLOBIN: 7 g/dL — AB (ref 13.0–17.0)

## 2014-09-06 LAB — PREPARE RBC (CROSSMATCH)

## 2014-09-06 LAB — ABO/RH: ABO/RH(D): O POS

## 2014-09-06 LAB — POC OCCULT BLOOD, ED: FECAL OCCULT BLD: POSITIVE — AB

## 2014-09-06 LAB — ETHANOL: Alcohol, Ethyl (B): 164 mg/dL — ABNORMAL HIGH (ref <5)

## 2014-09-06 MED ORDER — PANTOPRAZOLE SODIUM 40 MG IV SOLR
40.0000 mg | Freq: Once | INTRAVENOUS | Status: AC
  Administered 2014-09-06: 40 mg via INTRAVENOUS
  Filled 2014-09-06: qty 40

## 2014-09-06 MED ORDER — POTASSIUM CHLORIDE CRYS ER 20 MEQ PO TBCR
40.0000 meq | EXTENDED_RELEASE_TABLET | Freq: Once | ORAL | Status: AC
  Administered 2014-09-06: 40 meq via ORAL
  Filled 2014-09-06: qty 2

## 2014-09-06 MED ORDER — GI COCKTAIL ~~LOC~~
30.0000 mL | Freq: Once | ORAL | Status: AC
  Administered 2014-09-06: 30 mL via ORAL
  Filled 2014-09-06: qty 30

## 2014-09-06 MED ORDER — POTASSIUM CHLORIDE 10 MEQ/100ML IV SOLN
10.0000 meq | INTRAVENOUS | Status: AC
  Administered 2014-09-06 (×2): 10 meq via INTRAVENOUS
  Filled 2014-09-06 (×2): qty 100

## 2014-09-06 MED ORDER — SODIUM CHLORIDE 0.9 % IV BOLUS (SEPSIS)
1000.0000 mL | Freq: Once | INTRAVENOUS | Status: AC
  Administered 2014-09-06: 1000 mL via INTRAVENOUS

## 2014-09-06 MED ORDER — ONDANSETRON HCL 4 MG/2ML IJ SOLN
4.0000 mg | Freq: Once | INTRAMUSCULAR | Status: AC
  Administered 2014-09-06: 4 mg via INTRAVENOUS
  Filled 2014-09-06: qty 2

## 2014-09-06 MED ORDER — SODIUM CHLORIDE 0.9 % IV SOLN: 10.0000 mL/h | Freq: Once | INTRAVENOUS | Status: DC

## 2014-09-06 NOTE — ED Notes (Signed)
Per EMS pt contacted d/t intoxication. Pt found at [employer] city. Pt would like to be detoxed and demanded to come to the hospital

## 2014-09-06 NOTE — Progress Notes (Signed)
EDCM went to speak to patient at bedside, however patient was asleep. Patient listed as being homeless.  EDCM provide patient with pamphlet to [facility], informed patient of services there and walk in times.  EDCM also provided patient with list of pcps who accept self pay patients, list of discount pharmacies and websites needymeds.org and GoodRX.com for medication assistance, phone number to inquire about the orange card, phone number to inquire about Mediciad, phone number to inquire about the Affordable Care Act, financial resources in the community such as local churches, salvation army, urban ministries, and dental assistance for uninsured patients.  Phone number and address to [facility] included in resources.  EDCM placed resources in patient's belongings bag.  EDCM did not wake patient at this time. No further EDCM needs at this time.

## 2014-09-06 NOTE — ED Notes (Addendum)
Attempt to call report to [place], Charity fundraiser. Unable since giving blood. Asked to call back for report.

## 2014-09-06 NOTE — ED Notes (Signed)
Bed: HA57 Expected date:  Expected time:  Means of arrival:  Comments: summers
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
## 2014-09-06 NOTE — ED Notes (Signed)
Attempt to call report. Per charge, will have to call back when able to find a nurse to take patient.

## 2014-09-06 NOTE — ED Notes (Signed)
Pt can swear and speak  rudely  to staff, when asked questions regarding POC or given care. He makes comments that he doesn't want to be bothered and communicates threats and blames others for him being here.
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
## 2014-09-06 NOTE — ED Provider Notes (Signed)
Patient admits to drinking alcohol earlier today. Complaint of generalized weakness. And vague epigastric discomfort which is improved since being here. Patient is alert nontoxic Glasgow Coma Score 15 no distress  Doug Sou, MD 09/06/14 2055

## 2014-09-06 NOTE — ED Notes (Signed)
Bed: [facility] Expected date:  Expected time:  Means of arrival:  Comments: etoh 

## 2014-09-06 NOTE — Progress Notes (Addendum)
RN paged this NP stating pt was just admitted to floor from ED and wanted to leave AMA. By the time this NP called back, pt had already signed out AMA and had been escorted out by security.

## 2014-09-06 NOTE — Progress Notes (Signed)
Pt states "I feel suicidal and homicidal and you better not write it down" when asked suicidal questions in nursing admission hx. [name] rn informed. Briscoe Burns BSN, RN-BC Admissions RN  09/06/2014 8:46 PM

## 2014-09-06 NOTE — Progress Notes (Signed)
CSW met with pt at bedside. Patient confirms that he is homeless.  CSW asked pt would he be interested in resources regarding homelessness and food resources. However, he declined. Patient stated " I know where they all at. Nope!"  Willette Brace [phone] ED CSW 09/06/2014 5:43 PM

## 2014-09-07 DIAGNOSIS — F10929 Alcohol use, unspecified with intoxication, unspecified: Secondary | ICD-10-CM | POA: Insufficient documentation

## 2014-09-07 NOTE — Discharge Summary (Addendum)
Physician Discharge Summary  [name] ZOX:[id number] DOB: 1955-08-21 DOA: 09/06/2014  PCP: No PCP Per Patient  Admit date: 09/06/2014 Discharge date: 09/07/2014  Time spent: 20 minutes  Recommendations for Outpatient Follow-up:  Could no be made since patient left hospital on AMA. I was not be able to talk to patient before he left hospital.  Discharge Diagnoses:  Principal Problem:   GIB (gastrointestinal bleeding) Active Problems:   Alcohol dependence   Bipolar disorder   Tobacco use disorder   Alcohol abuse with intoxication, uncomplicated   Alcohol abuse   Polysubstance abuse   GERD (gastroesophageal reflux disease)   PUD (peptic ulcer disease)   Homeless   Discharge Condition: patient is at risk of deteriorating due to ongoing GI bleeding.   Diet recommendation: Could no be made since patient left hospital on AMA. I was not be able to talk to patient.   There were no vitals filed for this visit.  History of present illness:   Jimmy [name] is a 59 y.o. male with PMH of polysubstance abuse, including alcohol abuse, tobacco abuse and drug use, schizophrenia, bipolar disorder, history of peptic ulcer disease, GERD, depression, who presents with nausea, vomiting and alcohol intoxication.  Patient was picked up by EMS for alcohol intoxication on the [place] [address]. He had active nausea and vomiting initially in emergency room. He told EMS that he had some epigastric pain, but denies abdominal pain to ED and myself. He admits to drinking all day today and initially appears to be very intoxicated per Ed. When I saw patient in ED, his mental status has improved significantly, he is laying in the bed comfortably. No specific complaints, no chest pain, shortness of breath, abdominal pain, diarrhea, nausea or vomiting. He states that he was told to have had dark stool by "them". No symptoms of UTI.  In ED, patient was found to have hemoglobin drop from 13 on 08/11/14 to 7.3,  positive FOBT, alcohol level 164, no tachycardia, normal temperature, electrolytes okay. Patient is admitted to inpatient for further evaluation and treatment.   Hospital Course:   GIB: Patient has positive FOBT with hemoglobin drop from 13-7.3. He has history of peptic ulcer disease. Likely to have alcoholic induced gastritis or bleeding from possible ulcer. He is also at risk of developing alcoholic cirrhosis. Currently hemodynamically stable. One unit of blood were ordered by ED. IV protonix was started. GI was consulted for possible EGD, unfortunately patient left hospital on AMA.  Polysubstance abuse: including history of abusing alcohol, tobacco and drug. Alcohol level 164 on admission -Did counseling about importance of quitting smoking -gave Nicotine patch -Did counseling about the importance of quitting drinking -CIWA protocol was started in hospital  Psych issues: Patient has history of schizophrenia and bipolar disorder: Seems to be stable. No suicidal or homicidal ideations. Patient is not psychotic on medications at home. Patient is psychotic on admission. -observed patient closely in hospital. No acute issues.  Homeless: -consulted to SW, but not able to see since pt left hospital on AMA  Procedures: -EKG showed QTC 461, incomplete right bundle blockage -FOBT positive  Consultations: consulted to GI, but not able to see since pt left hospital on AMA   Discharge Exam: Filed Vitals:   09/06/14 2004  BP: 100/60  Pulse: 72  Temp: 98.7 F (37.1 C)  Resp: 18   General: Not in acute distress. Dry mucus and membrane. HEENT:       Eyes: PERRL, EOMI, no scleral icterus.  ENT: No discharge from the ears and nose, no pharynx injection, no tonsillar enlargement.         Neck: No JVD, no bruit, no mass felt. Heme: No neck lymph node enlargement. Cardiac: S1/S2, RRR, No murmurs, No gallops or rubs. Pulm: No rales, wheezing, rhonchi or rubs. Abd: Soft, nondistended,  nontender, no rebound pain, no organomegaly, BS present. Ext: No pitting leg edema bilaterally. 2+DP/PT pulse bilaterally. Musculoskeletal: No joint deformities, No joint redness or warmth, no limitation of ROM in spin. Skin: No rashes.   Neuro: currently alert, oriented X3, cranial nerves II-XII grossly intact, muscle strength 5/5 in all extremities, sensation to light touch intact. Brachial reflex 2+ bilaterally. Knee reflex 1+ bilaterally. Negative Babinski's sign. Normal finger to nose test. No active withdrawal symptoms other than vomiting. No tremor.   Psych: Patient is not psychotic, no suicidal or hemocidal ideation.  Discharge Instructions:  Could no be made since patient left hospital on AMA. I was not be able to talk to patient.    Discharge Medication List as of 09/06/2014 10:09 PM    CONTINUE these medications which have NOT CHANGED   Details  predniSONE (DELTASONE) 5 MG tablet Take 4 tablets (20 mg total) by mouth daily., Starting 04/25/2014, Until Discontinued, Print    ranitidine (ZANTAC) 150 MG tablet Take 150 mg by mouth 2 (two) times daily as needed for heartburn., Until Discontinued, Historical Med       No Known Allergies    The results of significant diagnostics from this hospitalization (including imaging, microbiology, ancillary and laboratory) are listed below for reference.    Significant Diagnostic Studies: -FOBT positive -Hgb dropped from 13.0 to 7.3  Microbiology: No results found for this or any previous visit (from the past 240 hour(s)).   Labs: Basic Metabolic Panel:  Recent Labs Lab 09/06/14 1636  NA 137  K 2.5*  CL 100*  CO2 27  GLUCOSE 91  BUN 8  CREATININE 0.64  CALCIUM 9.2   Liver Function Tests:  Recent Labs Lab 09/06/14 1636  AST 28  ALT 15*  ALKPHOS 52  BILITOT 0.3  PROT 6.3*  ALBUMIN 2.9*   No results for input(s): LIPASE, AMYLASE in the last 168 hours. No results for input(s): AMMONIA in the last 168  hours. CBC:  Recent Labs Lab 09/06/14 1636 09/06/14 1932  WBC 5.8  --   NEUTROABS 3.2  --   HGB 7.3* 7.0*  HCT 22.9* 21.9*  MCV 91.2  --   PLT 489*  --    Cardiac Enzymes: No results for input(s): CKTOTAL, CKMB, CKMBINDEX, TROPONINI in the last 168 hours. BNP: BNP (last 3 results) No results for input(s): BNP in the last 8760 hours.  ProBNP (last 3 results) No results for input(s): PROBNP in the last 8760 hours.  CBG: No results for input(s): GLUCAP in the last 168 hours.   Signed:  Lorretta Harp  [facility] 09/07/2014, 8:25 AM

## 2014-09-10 LAB — TYPE AND SCREEN
ABO/RH(D): O POS
ANTIBODY SCREEN: NEGATIVE
Unit division: 0

## 2014-09-14 ENCOUNTER — Emergency Department (HOSPITAL_COMMUNITY): Payer: Self-pay

## 2014-09-14 ENCOUNTER — Encounter (HOSPITAL_COMMUNITY): Payer: Self-pay | Admitting: Emergency Medicine

## 2014-09-14 ENCOUNTER — Emergency Department (HOSPITAL_COMMUNITY)
Admission: EM | Admit: 2014-09-14 | Discharge: 2014-09-14 | Disposition: A | Discharge status: Home / Self Care | Payer: Self-pay | Attending: Emergency Medicine | Admitting: Emergency Medicine

## 2014-09-14 DIAGNOSIS — Y929 Unspecified place or not applicable: Secondary | ICD-10-CM | POA: Insufficient documentation

## 2014-09-14 DIAGNOSIS — F10920 Alcohol use, unspecified with intoxication, uncomplicated: Secondary | ICD-10-CM

## 2014-09-14 DIAGNOSIS — Y939 Activity, unspecified: Secondary | ICD-10-CM | POA: Insufficient documentation

## 2014-09-14 DIAGNOSIS — Z8659 Personal history of other mental and behavioral disorders: Secondary | ICD-10-CM | POA: Insufficient documentation

## 2014-09-14 DIAGNOSIS — Z720 Tobacco use: Secondary | ICD-10-CM | POA: Insufficient documentation

## 2014-09-14 DIAGNOSIS — F10129 Alcohol abuse with intoxication, unspecified: Secondary | ICD-10-CM | POA: Insufficient documentation

## 2014-09-14 DIAGNOSIS — Y999 Unspecified external cause status: Secondary | ICD-10-CM | POA: Insufficient documentation

## 2014-09-14 DIAGNOSIS — S0101XA Laceration without foreign body of scalp, initial encounter: Secondary | ICD-10-CM | POA: Insufficient documentation

## 2014-09-14 DIAGNOSIS — K219 Gastro-esophageal reflux disease without esophagitis: Secondary | ICD-10-CM | POA: Insufficient documentation
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
## 2015-02-22 ENCOUNTER — Emergency Department (HOSPITAL_COMMUNITY)
Admission: EM | Admit: 2015-02-22 | Discharge: 2015-02-22 | Disposition: A | Discharge status: Home / Self Care | Payer: Self-pay | Attending: Emergency Medicine | Admitting: Emergency Medicine

## 2015-02-22 DIAGNOSIS — K2920 Alcoholic gastritis without bleeding: Secondary | ICD-10-CM | POA: Insufficient documentation

## 2015-02-22 DIAGNOSIS — Z8711 Personal history of peptic ulcer disease: Secondary | ICD-10-CM | POA: Insufficient documentation

## 2015-02-22 DIAGNOSIS — F1020 Alcohol dependence, uncomplicated: Secondary | ICD-10-CM | POA: Insufficient documentation

## 2015-02-22 DIAGNOSIS — Z8659 Personal history of other mental and behavioral disorders: Secondary | ICD-10-CM | POA: Insufficient documentation

## 2015-02-22 DIAGNOSIS — F17210 Nicotine dependence, cigarettes, uncomplicated: Secondary | ICD-10-CM | POA: Insufficient documentation

## 2015-02-22 DIAGNOSIS — Z9049 Acquired absence of other specified parts of digestive tract: Secondary | ICD-10-CM | POA: Insufficient documentation

## 2015-02-22 LAB — COMPREHENSIVE METABOLIC PANEL
ALBUMIN: 4.1 g/dL (ref 3.5–5.0)
ALT: 21 U/L (ref 17–63)
ANION GAP: 13 (ref 5–15)
AST: 37 U/L (ref 15–41)
Alkaline Phosphatase: 80 U/L (ref 38–126)
BUN: 7 mg/dL (ref 6–20)
CO2: 20 mmol/L — AB (ref 22–32)
Calcium: 8.8 mg/dL — ABNORMAL LOW (ref 8.9–10.3)
Chloride: 101 mmol/L (ref 101–111)
Creatinine, Ser: 0.72 mg/dL (ref 0.61–1.24)
GFR calc Af Amer: >60 mL/min (ref >60)
GFR calc non Af Amer: >60 mL/min (ref >60)
GLUCOSE: 99 mg/dL (ref 65–99)
POTASSIUM: 4 mmol/L (ref 3.5–5.1)
SODIUM: 134 mmol/L — AB (ref 135–145)
Total Bilirubin: 0.5 mg/dL (ref 0.3–1.2)
Total Protein: 8.2 g/dL — ABNORMAL HIGH (ref 6.5–8.1)

## 2015-02-22 LAB — CBC WITH DIFFERENTIAL/PLATELET
BASOS ABS: 0.1 10*3/uL (ref 0.0–0.1)
BASOS PCT: 1 %
EOS ABS: 1.5 10*3/uL — AB (ref 0.0–0.7)
Eosinophils Relative: 13 %
HEMATOCRIT: 35.5 % — AB (ref 39.0–52.0)
HEMOGLOBIN: 12.4 g/dL — AB (ref 13.0–17.0)
Lymphocytes Relative: 28 %
Lymphs Abs: 3 10*3/uL (ref 0.7–4.0)
MCH: 31.9 pg (ref 26.0–34.0)
MCHC: 34.9 g/dL (ref 30.0–36.0)
MCV: 91.3 fL (ref 78.0–100.0)
MONO ABS: 0.7 10*3/uL (ref 0.1–1.0)
MONOS PCT: 7 %
NEUTROS ABS: 5.7 10*3/uL (ref 1.7–7.7)
NEUTROS PCT: 51 %
Platelets: 337 10*3/uL (ref 150–400)
RBC: 3.89 MIL/uL — ABNORMAL LOW (ref 4.22–5.81)
RDW: 13.8 % (ref 11.5–15.5)
WBC: 11 10*3/uL — ABNORMAL HIGH (ref 4.0–10.5)

## 2015-02-22 LAB — LIPASE, BLOOD: Lipase: 48 U/L (ref 11–51)

## 2015-02-22 MED ORDER — PANTOPRAZOLE SODIUM 40 MG IV SOLR
40.0000 mg | Freq: Once | INTRAVENOUS | Status: AC
  Administered 2015-02-22: 40 mg via INTRAVENOUS
  Filled 2015-02-22: qty 40

## 2015-02-22 MED ORDER — SODIUM CHLORIDE 0.9 % IV BOLUS (SEPSIS)
1000.0000 mL | Freq: Once | INTRAVENOUS | Status: AC
Start: 2015-02-22 — End: 2015-02-22
  Administered 2015-02-22: 1000 mL via INTRAVENOUS

## 2015-02-22 MED ORDER — FAMOTIDINE 20 MG PO TABS: 20.0000 mg | ORAL_TABLET | Freq: Two times a day (BID) | ORAL | Status: DC

## 2015-02-22 MED ORDER — FAMOTIDINE IN NACL 20-0.9 MG/50ML-% IV SOLN
20.0000 mg | Freq: Once | INTRAVENOUS | Status: AC
  Administered 2015-02-22: 20 mg via INTRAVENOUS
  Filled 2015-02-22: qty 50

## 2015-02-22 MED ORDER — SUCRALFATE 1 G PO TABS
1.0000 g | ORAL_TABLET | Freq: Once | ORAL | Status: AC
  Administered 2015-02-22: 1 g via ORAL
  Filled 2015-02-22: qty 1

## 2015-02-22 MED ORDER — OMEPRAZOLE 20 MG PO CPDR: 20.0000 mg | DELAYED_RELEASE_CAPSULE | Freq: Every day | ORAL | Status: DC

## 2015-02-22 MED ORDER — MORPHINE SULFATE (PF) 4 MG/ML IV SOLN
4.0000 mg | Freq: Once | INTRAVENOUS | Status: AC
  Administered 2015-02-22: 4 mg via INTRAVENOUS
  Filled 2015-02-22: qty 1

## 2015-02-22 MED ORDER — ONDANSETRON HCL 4 MG/2ML IJ SOLN
4.0000 mg | Freq: Once | INTRAMUSCULAR | Status: AC
  Administered 2015-02-22: 4 mg via INTRAVENOUS
  Filled 2015-02-22: qty 2

## 2015-02-22 NOTE — ED Notes (Signed)
When pt was being discharged, he asked for 2 bus passes.  It was explained that I would be happy to get him 1 bus pass, but I could not give him two.  Pt states, "well how am I supposed to get to where I need to be? Well, if you ain't gonna give me 2 bus passes, you need to give me a bus pass and 5 dollars."  I stated that I did not have 5 dollars to give him.  "Well now I need $6 and a bus pass".  I explained that I still did not have money to give him.  This writer went to get a bus pass for patient.  No more bus passes in the department.  Pt then became verbally aggressive.  "That's some bullshit!" Pt states that he will not be leaving without a bus pass and money.  Security called to escort pt off the property.  Pt refused discharge papers/prescriptions.
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
## 2015-02-22 NOTE — Discharge Instructions (Signed)
Possible Peptic Ulcer A peptic ulcer is a sore in the lining of your esophagus (esophageal ulcer), stomach (gastric ulcer), or in the first part of your small intestine (duodenal ulcer). The ulcer causes erosion into the deeper tissue. CAUSES  Normally, the lining of the stomach and the small intestine protects itself from the acid that digests food. The protective lining can be damaged by:  An infection caused by a bacterium called Helicobacter pylori (H. pylori).  Regular use of nonsteroidal anti-inflammatory drugs (NSAIDs), such as ibuprofen or aspirin.  Smoking tobacco. Other risk factors include being older than 50, drinking alcohol excessively, and having a family history of ulcer disease.  SYMPTOMS   Burning pain or gnawing in the area between the chest and the belly button.  Heartburn.  Nausea and vomiting.  Bloating. The pain can be worse on an empty stomach and at night. If the ulcer results in bleeding, it can cause:  Black, tarry stools.  Vomiting of bright red blood.  Vomiting of coffee-ground-looking materials. DIAGNOSIS  A diagnosis is usually made based upon your history and an exam. Other tests and procedures may be performed to find the cause of the ulcer. Finding a cause will help determine the best treatment. Tests and procedures may include:  Blood tests, stool tests, or breath tests to check for the bacterium H. pylori.  An upper gastrointestinal (GI) series of the esophagus, stomach, and small intestine.  An endoscopy to examine the esophagus, stomach, and small intestine.  A biopsy. TREATMENT  Treatment may include:  Eliminating the cause of the ulcer, such as smoking, NSAIDs, or alcohol.  Medicines to reduce the amount of acid in your digestive tract.  Antibiotic medicines if the ulcer is caused by the H. pylori bacterium.  An upper endoscopy to treat a bleeding ulcer.  Surgery if the bleeding is severe or if the ulcer created a hole somewhere  in the digestive system. HOME CARE INSTRUCTIONS   Avoid tobacco, alcohol, and caffeine. Smoking can increase the acid in the stomach, and continued smoking will impair the healing of ulcers.  Avoid foods and drinks that seem to cause discomfort or aggravate your ulcer.  Only take medicines as directed by your caregiver. Do not substitute over-the-counter medicines for prescription medicines without talking to your caregiver.  Keep any follow-up appointments and tests as directed. SEEK MEDICAL CARE IF:   Your do not improve within 7 days of starting treatment.  You have ongoing indigestion or heartburn. SEEK IMMEDIATE MEDICAL CARE IF:   You have sudden, sharp, or persistent abdominal pain.  You have bloody or dark black, tarry stools.  You vomit blood or vomit that looks like coffee grounds.  You become light-headed, weak, or feel faint.  You become sweaty or clammy. MAKE SURE YOU:   Understand these instructions.  Will watch your condition.  Will get help right away if you are not doing well or get worse.   This information is not intended to replace advice given to you by your health care provider. Make sure you discuss any questions you have with your health care provider.   Document Released: 03/07/2000 Document Revised: 03/31/2014 Document Reviewed: 10/08/2011 Elsevier Interactive Patient Education 2016 Elsevier Inc. Gastritis, Adult Gastritis is soreness and swelling (inflammation) of the lining of the stomach. Gastritis can develop as a sudden onset (acute) or long-term (chronic) condition. If gastritis is not treated, it can lead to stomach bleeding and ulcers. CAUSES  Gastritis occurs when the stomach lining is  weak or damaged. Digestive juices from the stomach then inflame the weakened stomach lining. The stomach lining may be weak or damaged due to viral or bacterial infections. One common bacterial infection is the Helicobacter pylori infection. Gastritis can also  result from excessive alcohol consumption, taking certain medicines, or having too much acid in the stomach.  SYMPTOMS  In some cases, there are no symptoms. When symptoms are present, they may include:  Pain or a burning sensation in the upper abdomen.  Nausea.  Vomiting.  An uncomfortable feeling of fullness after eating. DIAGNOSIS  Your caregiver may suspect you have gastritis based on your symptoms and a physical exam. To determine the cause of your gastritis, your caregiver may perform the following:  Blood or stool tests to check for the H pylori bacterium.  Gastroscopy. A thin, flexible tube (endoscope) is passed down the esophagus and into the stomach. The endoscope has a light and camera on the end. Your caregiver uses the endoscope to view the inside of the stomach.  Taking a tissue sample (biopsy) from the stomach to examine under a microscope. TREATMENT  Depending on the cause of your gastritis, medicines may be prescribed. If you have a bacterial infection, such as an H pylori infection, antibiotics may be given. If your gastritis is caused by too much acid in the stomach, H2 blockers or antacids may be given. Your caregiver may recommend that you stop taking aspirin, ibuprofen, or other nonsteroidal anti-inflammatory drugs (NSAIDs). HOME CARE INSTRUCTIONS  Only take over-the-counter or prescription medicines as directed by your caregiver.  If you were given antibiotic medicines, take them as directed. Finish them even if you start to feel better.  Drink enough fluids to keep your urine clear or pale yellow.  Avoid foods and drinks that make your symptoms worse, such as:  Caffeine or alcoholic drinks.  Chocolate.  Peppermint or mint flavorings.  Garlic and onions.  Spicy foods.  Citrus fruits, such as oranges, lemons, or limes.  Tomato-based foods such as sauce, chili, salsa, and pizza.  Fried and fatty foods.  Eat small, frequent meals instead of large  meals. SEEK IMMEDIATE MEDICAL CARE IF:   You have black or dark red stools.  You vomit blood or material that looks like coffee grounds.  You are unable to keep fluids down.  Your abdominal pain gets worse.  You have a fever.  You do not feel better after 1 week.  You have any other questions or concerns. MAKE SURE YOU:  Understand these instructions.  Will watch your condition.  Will get help right away if you are not doing well or get worse.   This information is not intended to replace advice given to you by your health care provider. Make sure you discuss any questions you have with your health care provider.   Document Released: 03/04/2001 Document Revised: 09/09/2011 Document Reviewed: 04/23/2011 Elsevier Interactive Patient Education 2016 [employer]. Alcohol Use Disorder Alcohol use disorder is a mental disorder. It is not a one-time incident of heavy drinking. Alcohol use disorder is the excessive and uncontrollable use of alcohol over time that leads to problems with functioning in one or more areas of daily living. People with this disorder risk harming themselves and others when they drink to excess. Alcohol use disorder also can cause other mental disorders, such as mood and anxiety disorders, and serious physical problems. People with alcohol use disorder often misuse other drugs.  Alcohol use disorder is common and widespread. Some  people with this disorder drink alcohol to cope with or escape from negative life events. Others drink to relieve chronic pain or symptoms of mental illness. People with a family history of alcohol use disorder are at higher risk of losing control and using alcohol to excess.  Drinking too much alcohol can cause injury, accidents, and health problems. One drink can be too much when you are:  Working.  Pregnant or breastfeeding.  Taking medicines. Ask your doctor.  Driving or planning to drive. SYMPTOMS  Signs and symptoms of  alcohol use disorder may include the following:   Consumption ofalcohol inlarger amounts or over a longer period of time than intended.  Multiple unsuccessful attempts to cutdown or control alcohol use.   A great deal of time spent obtaining alcohol, using alcohol, or recovering from the effects of alcohol (hangover).  A strong desire or urge to use alcohol (cravings).   Continued use of alcohol despite problems at work, school, or home because of alcohol use.   Continued use of alcohol despite problems in relationships because of alcohol use.  Continued use of alcohol in situations when it is physically hazardous, such as driving a car.  Continued use of alcohol despite awareness of a physical or psychological problem that is likely related to alcohol use. Physical problems related to alcohol use can involve the brain, heart, liver, stomach, and intestines. Psychological problems related to alcohol use include intoxication, depression, anxiety, psychosis, delirium, and dementia.   The need for increased amounts of alcohol to achieve the same desired effect, or a decreased effect from the consumption of the same amount of alcohol (tolerance).  Withdrawal symptoms upon reducing or stopping alcohol use, or alcohol use to reduce or avoid withdrawal symptoms. Withdrawal symptoms include:  Racing heart.  Hand tremor.  Difficulty sleeping.  Nausea.  Vomiting.  Hallucinations.  Restlessness.  Seizures. DIAGNOSIS Alcohol use disorder is diagnosed through an assessment by your health care provider. Your health care provider may start by asking three or four questions to screen for excessive or problematic alcohol use. To confirm a diagnosis of alcohol use disorder, at least two symptoms must be present within a 65-month period. The severity of alcohol use disorder depends on the number of symptoms:  Mild--two or three.  Moderate--four or five.  Severe--six or more. Your  health care provider may perform a physical exam or use results from lab tests to see if you have physical problems resulting from alcohol use. Your health care provider may refer you to a mental health professional for evaluation. TREATMENT  Some people with alcohol use disorder are able to reduce their alcohol use to low-risk levels. Some people with alcohol use disorder need to quit drinking alcohol. When necessary, mental health professionals with specialized training in substance use treatment can help. Your health care provider can help you decide how severe your alcohol use disorder is and what type of treatment you need. The following forms of treatment are available:   Detoxification. Detoxification involves the use of prescription medicines to prevent alcohol withdrawal symptoms in the first week after quitting. This is important for people with a history of symptoms of withdrawal and for heavy drinkers who are likely to have withdrawal symptoms. Alcohol withdrawal can be dangerous and, in severe cases, cause death. Detoxification is usually provided in a hospital or in-patient substance use treatment facility.  Counseling or talk therapy. Talk therapy is provided by substance use treatment counselors. It addresses the reasons people use alcohol  and ways to keep them from drinking again. The goals of talk therapy are to help people with alcohol use disorder find healthy activities and ways to cope with life stress, to identify and avoid triggers for alcohol use, and to handle cravings, which can cause relapse.  Medicines.Different medicines can help treat alcohol use disorder through the following actions:  Decrease alcohol cravings.  Decrease the positive reward response felt from alcohol use.  Produce an uncomfortable physical reaction when alcohol is used (aversion therapy).  Support groups. Support groups are run by people who have quit drinking. They provide emotional support, advice,  and guidance. These forms of treatment are often combined. Some people with alcohol use disorder benefit from intensive combination treatment provided by specialized substance use treatment centers. Both inpatient and outpatient treatment programs are available.   This information is not intended to replace advice given to you by your health care provider. Make sure you discuss any questions you have with your health care provider.   Document Released: 04/17/2004 Document Revised: 03/31/2014 Document Reviewed: 06/17/2012 Elsevier Interactive Patient Education 2016 [employer].  Emergency Department Resource Guide 1) Find a Doctor and Pay Out of Pocket Although you won't have to find out who is covered by your insurance plan, it is a good idea to ask around and get recommendations. You will then need to call the office and see if the doctor you have chosen will accept you as a new patient and what types of options they offer for patients who are self-pay. Some doctors offer discounts or will set up payment plans for their patients who do not have insurance, but you will need to ask so you aren't surprised when you get to your appointment.  2) Contact Your Local Health Department Not all health departments have doctors that can see patients for sick visits, but many do, so it is worth a call to see if yours does. If you don't know where your local health department is, you can check in your phone book. The CDC also has a tool to help you locate your state's health department, and many state websites also have listings of all of their local health departments.  3) Find a Walk-in Clinic If your illness is not likely to be very severe or complicated, you may want to try a walk in clinic. These are popping up all over the country in pharmacies, drugstores, and shopping centers. They're usually staffed by nurse practitioners or physician assistants that have been trained to treat common illnesses and  complaints. They're usually fairly quick and inexpensive. However, if you have serious medical issues or chronic medical problems, these are probably not your best option.  No Primary Care Doctor: - Call Health Connect at  [phone] - they can help you locate a primary care doctor that  accepts your insurance, provides certain services, etc. - Physician Referral Service- [phone]  Chronic Pain Problems: Organization         Address  Phone   Notes  [facility]  [phone] Patients need to be referred by their primary care doctor.   Medication Assistance: Organization         Address  Phone   Notes  [facility] Medication [facility] [address] [place], [place] [address] [phone] --Must be a resident of [facility] -- Must have NO insurance coverage whatsoever (no Medicaid/ Medicare, etc.) -- The pt. MUST have a primary care doctor that directs  their care regularly and follows them in the community   [facility]  [phone]   [facility]  [phone]    Agencies that provide inexpensive medical care: Organization         Address  Phone   Notes  [facility]  [phone]   [facility]    [phone]   [facility] [address] [place], [place] [address] [phone]   [facility] [address], [place] [phone]   [facility]anned Parenthood    [phone]   [facility]    [phone]   [facility]  [address], [place] Phone:  [phone], Fax:  [phone] Hours of Operation:  9 am - 6 pm, M-F.  Also accepts Medicaid/Medicare and self-pay.  [facility]  [address], [place] Phone: [phone], Fax: [phone]. Hours of Operation:  8:30 am - 5:30 pm, M-F.  Also accepts Medicaid and self-pay.  [facility] [place] [address] [place] Phone: [phone]   [facility] [address] Natasha Bence [place], [place] [phone], Ext. [phone] Mondays & Thursdays: 7-9 AM.  First 15 patients are seen on a first come, first serve basis.    Medicaid-accepting [facility] Providers:  Organization         Address  Phone   Notes  [facility] [address], [place] [phone] Also accepts self-pay patients.  [facility] [address] Laurell Josephs [place], [place]  [phone]   [facility] [address], [place] [phone]   [facility] [address] [place] [phone]   Renaye Rakers [address], [place]   [phone] Only accepts [place] patients after they have their name applied to their card.   Self-Pay (no insurance) in [facility]:  Organization         Address  Phone   Notes  Sickle Cell Patients, [facility] [address] [place], [place] [phone]   [facility] [address] [place], [place] [phone]   [facility] [place]  [address], [place] [phone]   [facility]/Dr. Osei-Bonsu  [address] [place] or [address], [place] [phone] Phone number for both [place] and [place] locations is the same.  [facility] [address] [place] [phone]   [facility] [address] [place] or [address] [phone]   [facility] [address] [place] [phone], phone; [phone], fax Sees patients 1st and 3rd Saturday of every month.  Must not qualify for public or private insurance (i.e. Medicaid, Medicare, [place] Health Choice, Veterans' Benefits)  Household income should be no more than 200% of the poverty level  The clinic cannot treat you if you are pregnant or think you are pregnant  Sexually transmitted diseases are not treated at the clinic.    Dental Care: Organization         Address  Phone  Notes  [facility] Department of [facility] [address] [place], [place] [phone] Accepts children up to  age 71 who are enrolled in Medicaid or [place] Health Choice; pregnant women with a Medicaid card; and children who have applied for Medicaid or [place] Health Choice, but were declined, whose parents can pay a reduced fee at time of service.  [facility] Department of [facility]  [address], [place] [phone] Accepts children up to age 36 who are enrolled in [place] or [place] Health Choice; pregnant women with a Medicaid card; and children who have applied for Medicaid or [place] Health Choice, but were declined, whose parents can pay a reduced fee at time of service.  [facility]  [address] [place], [place] [phone] Patients are seen by appointment only. Walk-ins are not accepted. [facility] will see patients 69 years of age and older. Monday - Tuesday (8am-5pm) Most Wednesdays (8:30-5pm) $30 per visit, cash only  [facility] Adult Dental Access PRO[facility]  [address], [facility] [phone] Patients are seen by appointment only. Walk-ins are not accepted. [facility] will see patients 85 years of age and older. One Wednesday Evening (Monthly: Volunteer Based).  $30 per visit, cash only  [employer]  [phone] for adults; Children under age 90, call Graduate Pediatric Dentistry at [phone]. Children aged 39-14, please call [phone] to request a pediatric application.  Dental services are provided in all areas of dental care including fillings, crowns and bridges, complete and partial dentures, implants, gum treatment, root canals, and extractions. Preventive care is  also provided. Treatment is provided to both adults and children. Patients are selected via a lottery and there is often a waiting list.   [facility] [address] [place]  [phone] [email or web address]   [facility] [address] [place], [place] [phone], Ext. [phone] Second and Fourth Thursday of each month, opens at 6:30 AM; Clinic ends at 9 AM.  Patients are seen on a first-come first-served basis, and a limited number are seen during each clinic.   [facility]  [address] Ether Griffins [place], [place] [phone]   Eligibility Requirements You must have lived in [place], [place], or [place] counties for at least the last three months.   You cannot be eligible for state or federal sponsored [place], including [employer], [place], or [employer].   You generally cannot be eligible for healthcare insurance through your employer.    How to apply: Eligibility screenings are held every Tuesday and Wednesday afternoon from 1:00 pm until 4:00 pm. You do not need an appointment for the interview!  [facility] [address] [place], [place] [phone]   [facility]  [phone]   [facility] Health Department  [phone]   [facility] Health Department  [phone]    Behavioral Health Resources in the Community: Intensive Outpatient Programs Organization         Address  Phone  Notes  [facility] [address] [place], [place] [phone]   [facility] Outpatient [address], [place], [place] [phone]   [facility] [address] [place], [place]  [phone]   [facility] [address],  [place], [place] [phone] or [phone]   Substance Abuse Resources Organization         Address  Phone  Notes  [facility]  [phone]   [facility]  [phone]   [facility]  [phone]   [facility]  [phone]   Residential & Outpatient Substance Abuse Program  [phone]   Psychological Services Organization         Address  Phone  Notes  [facility]  [phone]- [phone]   [facility]  [phone]- [phone]   [facility] [address], [place] [phone] or [phone]    Mobile Crisis Teams Organization         Address  Phone  Notes  [facility], Mobile Crisis Care Unit  [phone]   [facility]  [address] [place], [place] [phone]   Doristine LocksSharon DeEsch [address] [place] [phone]    Self-Help/Support Groups Organization         Address  Phone             Notes  [facility] [place] - variety of support groups  [phone] Call for more information  Narcotics Anonymous (NA), [facility] [address], [place]  2 meetings at this location   [facility]idential Treatment Programs Organization         Address  Phone  Notes  [facility] [address],    [place]  [phone]   [facility]  [address], [place] [phone], [place], [place] [phone]   [facility] [address], [place] [phone] Admissions: 8am-3pm M-F  [facility] [address]    [place], [place] [phone]   [facility] [address], [place], [place] [phone]   [facility] [address] [address]  [place], [place] [phone]   [facility] - Intensive Outpatient [address], [place], [place] [phone]   [facility] [address]  [place], [place] [phone] or [phone]   [facility] [address] [place], [place] [phone] Accepts Medicaid  [facility] [address]  [place] [phone]  Substance Abuse/Addiction Treatment   [facility]         Address  Phone  Notes  [facility]  [phone]   Angie FavaJulie Brannon, PhD [address], [address] [place], [place]   [phone] or [phone]   [facility]   [address] [place] [address] [place], [place] [phone]   [facility] [address], [place], [place] [phone] Insurance/Medicaid/sponsorship through [facility]  [facility] [address]                                    [place], [place] [phone] Therapy/tele-psych/case  [facility] [address]   [place], [place] [phone]    Dr. Lolly MustacheArfeen  [phone]   [facility]  [employer] [facility] 1) [address], [place] 2) [address], [place] 3)  [address], [place] [phone]  [phone]   [facility] Child Abuse Hotline ([phone] or [phone](336) [phone] (After Hours)

## 2015-02-22 NOTE — ED Notes (Signed)
Bed: WA24 Expected date:  Expected time:  Means of arrival:  Comments: EMS/abd. pain 
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
## 2015-02-22 NOTE — ED Notes (Signed)
Pt was sleeping upon this writer's entrance into room.  Upon waking pt, pt states he is having "real bad" pain.

## 2015-02-24 ENCOUNTER — Encounter (HOSPITAL_COMMUNITY): Payer: Self-pay | Admitting: Emergency Medicine

## 2015-02-24 ENCOUNTER — Observation Stay (HOSPITAL_COMMUNITY)
Admission: AD | Admit: 2015-02-24 | Discharge: 2015-02-25 | Disposition: A | Discharge status: Home / Self Care | Payer: Federal, State, Local not specified - Other | Source: Intra-hospital | Attending: Psychiatry | Admitting: Psychiatry

## 2015-02-24 ENCOUNTER — Emergency Department (HOSPITAL_COMMUNITY)
Admission: EM | Admit: 2015-02-24 | Discharge: 2015-02-24 | Disposition: A | Discharge status: Other Acute Inpatient Hospital | Payer: Self-pay | Attending: Emergency Medicine | Admitting: Emergency Medicine

## 2015-02-24 ENCOUNTER — Encounter (HOSPITAL_COMMUNITY): Payer: Self-pay | Admitting: *Deleted

## 2015-02-24 DIAGNOSIS — R45851 Suicidal ideations: Secondary | ICD-10-CM | POA: Insufficient documentation

## 2015-02-24 DIAGNOSIS — F1490 Cocaine use, unspecified, uncomplicated: Secondary | ICD-10-CM | POA: Insufficient documentation

## 2015-02-24 DIAGNOSIS — F17210 Nicotine dependence, cigarettes, uncomplicated: Secondary | ICD-10-CM | POA: Insufficient documentation

## 2015-02-24 DIAGNOSIS — F332 Major depressive disorder, recurrent severe without psychotic features: Secondary | ICD-10-CM | POA: Diagnosis not present

## 2015-02-24 DIAGNOSIS — F10920 Alcohol use, unspecified with intoxication, uncomplicated: Secondary | ICD-10-CM

## 2015-02-24 DIAGNOSIS — K219 Gastro-esophageal reflux disease without esophagitis: Secondary | ICD-10-CM | POA: Insufficient documentation

## 2015-02-24 DIAGNOSIS — F331 Major depressive disorder, recurrent, moderate: Secondary | ICD-10-CM | POA: Diagnosis present

## 2015-02-24 DIAGNOSIS — F10230 Alcohol dependence with withdrawal, uncomplicated: Secondary | ICD-10-CM

## 2015-02-24 DIAGNOSIS — F1094 Alcohol use, unspecified with alcohol-induced mood disorder: Secondary | ICD-10-CM | POA: Diagnosis present

## 2015-02-24 DIAGNOSIS — F209 Schizophrenia, unspecified: Secondary | ICD-10-CM | POA: Insufficient documentation

## 2015-02-24 DIAGNOSIS — R109 Unspecified abdominal pain: Secondary | ICD-10-CM | POA: Insufficient documentation

## 2015-02-24 DIAGNOSIS — Z590 Homelessness: Secondary | ICD-10-CM | POA: Insufficient documentation

## 2015-02-24 DIAGNOSIS — F10129 Alcohol abuse with intoxication, unspecified: Secondary | ICD-10-CM | POA: Insufficient documentation

## 2015-02-24 DIAGNOSIS — Z8711 Personal history of peptic ulcer disease: Secondary | ICD-10-CM | POA: Insufficient documentation

## 2015-02-24 DIAGNOSIS — F1020 Alcohol dependence, uncomplicated: Secondary | ICD-10-CM | POA: Diagnosis present

## 2015-02-24 DIAGNOSIS — Z79899 Other long term (current) drug therapy: Secondary | ICD-10-CM | POA: Insufficient documentation

## 2015-02-24 DIAGNOSIS — F1290 Cannabis use, unspecified, uncomplicated: Secondary | ICD-10-CM | POA: Insufficient documentation

## 2015-02-24 DIAGNOSIS — F10220 Alcohol dependence with intoxication, uncomplicated: Secondary | ICD-10-CM

## 2015-02-24 DIAGNOSIS — F1024 Alcohol dependence with alcohol-induced mood disorder: Secondary | ICD-10-CM | POA: Insufficient documentation

## 2015-02-24 LAB — LIPASE, BLOOD: LIPASE: 43 U/L (ref 11–51)

## 2015-02-24 LAB — CBC WITH DIFFERENTIAL/PLATELET
BASOS PCT: 1 %
Basophils Absolute: 0 10*3/uL (ref 0.0–0.1)
EOS ABS: 1 10*3/uL — AB (ref 0.0–0.7)
Eosinophils Relative: 12 %
HCT: 32.6 % — ABNORMAL LOW (ref 39.0–52.0)
HEMOGLOBIN: 11.2 g/dL — AB (ref 13.0–17.0)
LYMPHS ABS: 2 10*3/uL (ref 0.7–4.0)
Lymphocytes Relative: 25 %
MCH: 31.9 pg (ref 26.0–34.0)
MCHC: 34.4 g/dL (ref 30.0–36.0)
MCV: 92.9 fL (ref 78.0–100.0)
MONO ABS: 0.4 10*3/uL (ref 0.1–1.0)
MONOS PCT: 6 %
Neutro Abs: 4.5 10*3/uL (ref 1.7–7.7)
Neutrophils Relative %: 56 %
Platelets: 275 10*3/uL (ref 150–400)
RBC: 3.51 MIL/uL — ABNORMAL LOW (ref 4.22–5.81)
RDW: 14.2 % (ref 11.5–15.5)
WBC: 8 10*3/uL (ref 4.0–10.5)

## 2015-02-24 LAB — RAPID URINE DRUG SCREEN, HOSP PERFORMED
Amphetamines: NOT DETECTED
BENZODIAZEPINES: NOT DETECTED
Barbiturates: NOT DETECTED
COCAINE: NOT DETECTED
OPIATES: NOT DETECTED
Tetrahydrocannabinol: NOT DETECTED

## 2015-02-24 LAB — COMPREHENSIVE METABOLIC PANEL
ALBUMIN: 3.9 g/dL (ref 3.5–5.0)
ALK PHOS: 89 U/L (ref 38–126)
ALT: 31 U/L (ref 17–63)
AST: 49 U/L — AB (ref 15–41)
Anion gap: 13 (ref 5–15)
BUN: 7 mg/dL (ref 6–20)
CALCIUM: 8.4 mg/dL — AB (ref 8.9–10.3)
CHLORIDE: 98 mmol/L — AB (ref 101–111)
CO2: 21 mmol/L — AB (ref 22–32)
CREATININE: 0.75 mg/dL (ref 0.61–1.24)
GFR calc Af Amer: >60 mL/min (ref >60)
GFR calc non Af Amer: >60 mL/min (ref >60)
GLUCOSE: 103 mg/dL — AB (ref 65–99)
Potassium: 3.8 mmol/L (ref 3.5–5.1)
SODIUM: 132 mmol/L — AB (ref 135–145)
Total Bilirubin: 0.7 mg/dL (ref 0.3–1.2)
Total Protein: 7.5 g/dL (ref 6.5–8.1)

## 2015-02-24 LAB — ETHANOL: Alcohol, Ethyl (B): 206 mg/dL — ABNORMAL HIGH (ref <5)

## 2015-02-24 MED ORDER — INFLUENZA VAC SPLIT QUAD 0.5 ML IM SUSY
0.5000 mL | PREFILLED_SYRINGE | INTRAMUSCULAR | Status: DC
  Filled 2015-02-24: qty 0.5

## 2015-02-24 MED ORDER — HYDROXYZINE HCL 25 MG PO TABS: 25.0000 mg | ORAL_TABLET | Freq: Four times a day (QID) | ORAL | Status: DC | PRN

## 2015-02-24 MED ORDER — LURASIDONE HCL 20 MG PO TABS
20.0000 mg | ORAL_TABLET | Freq: Every day | ORAL | Status: DC
  Administered 2015-02-24 – 2015-02-25 (×2): 20 mg via ORAL
  Filled 2015-02-24 (×4): qty 1

## 2015-02-24 MED ORDER — LORAZEPAM 1 MG PO TABS: 1.0000 mg | ORAL_TABLET | Freq: Every day | ORAL | Status: DC

## 2015-02-24 MED ORDER — LORAZEPAM 1 MG PO TABS
1.0000 mg | ORAL_TABLET | Freq: Four times a day (QID) | ORAL | Status: AC
  Administered 2015-02-24 – 2015-02-25 (×4): 1 mg via ORAL
  Filled 2015-02-24 (×4): qty 1

## 2015-02-24 MED ORDER — LORAZEPAM 1 MG PO TABS: 1.0000 mg | ORAL_TABLET | ORAL | Status: DC | PRN

## 2015-02-24 MED ORDER — NICOTINE 21 MG/24HR TD PT24
21.0000 mg | MEDICATED_PATCH | Freq: Every day | TRANSDERMAL | Status: DC
  Administered 2015-02-24 – 2015-02-25 (×2): 21 mg via TRANSDERMAL
  Filled 2015-02-24: qty 1

## 2015-02-24 MED ORDER — LORAZEPAM 1 MG PO TABS: 1.0000 mg | ORAL_TABLET | Freq: Four times a day (QID) | ORAL | Status: DC | PRN

## 2015-02-24 MED ORDER — THIAMINE HCL 100 MG/ML IJ SOLN: 100.0000 mg | Freq: Once | INTRAMUSCULAR | Status: DC

## 2015-02-24 MED ORDER — ACETAMINOPHEN 325 MG PO TABS: 650.0000 mg | ORAL_TABLET | Freq: Four times a day (QID) | ORAL | Status: DC | PRN

## 2015-02-24 MED ORDER — PANTOPRAZOLE SODIUM 40 MG PO TBEC
40.0000 mg | DELAYED_RELEASE_TABLET | Freq: Every day | ORAL | Status: DC
  Administered 2015-02-24: 40 mg via ORAL
  Filled 2015-02-24: qty 1

## 2015-02-24 MED ORDER — VITAMIN B-1 100 MG PO TABS
100.0000 mg | ORAL_TABLET | Freq: Every day | ORAL | Status: DC
  Administered 2015-02-25: 100 mg via ORAL

## 2015-02-24 MED ORDER — MAGNESIUM HYDROXIDE 400 MG/5ML PO SUSP: 30.0000 mL | Freq: Every day | ORAL | Status: DC | PRN

## 2015-02-24 MED ORDER — LORAZEPAM 1 MG PO TABS
1.0000 mg | ORAL_TABLET | Freq: Three times a day (TID) | ORAL | Status: DC
  Administered 2015-02-25: 1 mg via ORAL
  Filled 2015-02-24: qty 1

## 2015-02-24 MED ORDER — LORAZEPAM 1 MG PO TABS: 1.0000 mg | ORAL_TABLET | Freq: Two times a day (BID) | ORAL | Status: DC

## 2015-02-24 MED ORDER — LOPERAMIDE HCL 2 MG PO CAPS: 2.0000 mg | ORAL_CAPSULE | ORAL | Status: DC | PRN

## 2015-02-24 MED ORDER — FAMOTIDINE 20 MG PO TABS
20.0000 mg | ORAL_TABLET | Freq: Two times a day (BID) | ORAL | Status: DC
  Administered 2015-02-24 – 2015-02-25 (×2): 20 mg via ORAL
  Filled 2015-02-24: qty 1

## 2015-02-24 MED ORDER — ONDANSETRON 4 MG PO TBDP: 4.0000 mg | ORAL_TABLET | Freq: Four times a day (QID) | ORAL | Status: DC | PRN

## 2015-02-24 MED ORDER — ADULT MULTIVITAMIN W/MINERALS CH
1.0000 | ORAL_TABLET | Freq: Every day | ORAL | Status: DC
Start: 2015-02-24 — End: 2015-02-25
  Administered 2015-02-24 – 2015-02-25 (×2): 1 via ORAL
  Filled 2015-02-24: qty 1

## 2015-02-24 MED ORDER — ALUM & MAG HYDROXIDE-SIMETH 200-200-20 MG/5ML PO SUSP: 30.0000 mL | ORAL | Status: DC | PRN

## 2015-02-24 MED ORDER — ONDANSETRON 8 MG PO TBDP
8.0000 mg | ORAL_TABLET | Freq: Three times a day (TID) | ORAL | Status: DC | PRN
Start: 2015-02-24 — End: 2015-02-24

## 2015-02-24 MED ORDER — TRAZODONE HCL 50 MG PO TABS
50.0000 mg | ORAL_TABLET | Freq: Every evening | ORAL | Status: DC | PRN
  Administered 2015-02-24: 50 mg via ORAL
  Filled 2015-02-24: qty 1

## 2015-02-24 NOTE — ED Notes (Signed)
MD Wickline at bedside. 

## 2015-02-24 NOTE — ED Notes (Addendum)
TTS at bedside. 
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
## 2015-02-24 NOTE — BH Assessment (Signed)
Assessment completed. Consulted Georjean ModeIjeoma Nweaze, NP who recommended inpatient treatment.

## 2015-02-24 NOTE — ED Notes (Signed)
[facility] Plan of care discussed with patient. Patient voices no complaints or concerns at this time. Encouragement and support provided and safety maintain. Q 15 min safety checks in place.

## 2015-02-24 NOTE — Progress Notes (Signed)
[facility] INPATIENT:  Family/Significant Other Suicide Prevention Education  Suicide Prevention Education:  Patient Refusal for Family/Significant Other Suicide Prevention Education: The patient Jimmy [name] has refused to provide written consent for family/significant other to be provided Family/Significant Other Suicide Prevention Education during admission and/or prior to discharge.  Physician notified.  Wynona LunaBeck, Adham Johnson K 02/24/2015, 2:01 PM

## 2015-02-24 NOTE — Progress Notes (Signed)
Patient seen watching TV at the beginning of the shift. Pleasant and cooperative. Denies pain,SI, AH/VH at this time. Endorses mild depression. No further complaint.  Due medications/prn given as ordered. Constant monitoring/observation maintained. No behavioral issues noted.

## 2015-02-24 NOTE — ED Notes (Signed)
Pt wanded by security. 
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
## 2015-02-24 NOTE — ED Notes (Signed)
Pt changing into paper scrubs

## 2015-02-24 NOTE — ED Notes (Signed)
Asked patient if he would like to be discharged.  He replies, "no I wanna go to [facility]!"  Informed patient we would start the paperwork and get him transported over soon.
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
## 2015-02-24 NOTE — ED Notes (Addendum)
Pt brought in by [facility] c/o generalized nonpalpable abdominal pain and alcohol intoxication. Pt was seen here yesterday for same. Pt reports suicidal and homicidal ideations  Without a plan. He reports that he hears voices. When asked what they tell them he states "to kill myself". [facility].He denies a plan.  Pt is not very forthcoming when asked questions.

## 2015-02-24 NOTE — ED Notes (Signed)
Bed: WA17 Expected date:  Expected time:  Means of arrival:  Comments: EMS 

## 2015-02-24 NOTE — ED Notes (Signed)
Patient sitting on side of bed eating breakfast.  States, "I'm fine now I got some food."  Patient is disheveled and has smell of alcohol.  He has flat, blunted affect with anxious and depressed mood.  He denies SI.  When asked if he felt like hurting other, he stated, "that I don't know."  Patient does not remember how he got here.  He remembers, "I was drinking beer all day."  Patient is focused on bus passes and how he is going to get home.  Informed him doctor will be by this morning to assess him.

## 2015-02-24 NOTE — Progress Notes (Signed)
Patient ID: Irwin BrakemanJack L Beazer, male   DOB: 08/13/1955, 59 y.o.   MRN: [id number] Admission Note-[facility] presented two days in a row at the ED with complaints of stomach pain. Last night he also was intoxicated. Suspect stomach pain related to years of alcohol use. He reports a history of seizures related to drinking. Last one several months ago. He states he drinks about a case of beer a day. He is homeless and makes money panhandling. He is able to contract for safety. He reports hearing voices, unable to say what the voices say but denies he is having command hallucinations. He states he has been compliant with his Kasandra KnudsenLatuda, and gets [facility] and [facility].  He has been in OBS before, but states it has been about one year since being hospitalized. He denies having any support systme. He was cooperative with the admission process. Fed lunch on arrival. [place] into the unit.

## 2015-02-24 NOTE — ED Notes (Signed)
Patient discharged per MD order.  [facility] [place] [facility].  Patient is calm and cooperative.  His belongings were sent along with him.  Patient has poor hygiene and belongings are extremely odorous.  Patient states he has nowhere to go.

## 2015-02-25 DIAGNOSIS — F1094 Alcohol use, unspecified with alcohol-induced mood disorder: Secondary | ICD-10-CM | POA: Diagnosis not present

## 2015-02-25 DIAGNOSIS — F332 Major depressive disorder, recurrent severe without psychotic features: Secondary | ICD-10-CM | POA: Diagnosis not present

## 2015-02-25 MED ORDER — TRAZODONE HCL 50 MG PO TABS: 50.0000 mg | ORAL_TABLET | Freq: Every day | ORAL | Status: DC

## 2015-02-25 MED ORDER — NICOTINE 21 MG/24HR TD PT24: 21.0000 mg | MEDICATED_PATCH | Freq: Every day | TRANSDERMAL | Status: DC

## 2015-02-25 MED ORDER — LURASIDONE HCL 20 MG PO TABS: 20.0000 mg | ORAL_TABLET | Freq: Every day | ORAL | Status: DC

## 2015-02-25 MED ORDER — FAMOTIDINE 20 MG PO TABS: 20.0000 mg | ORAL_TABLET | Freq: Two times a day (BID) | ORAL | Status: DC

## 2015-02-25 NOTE — Discharge Instructions (Addendum)
Continue services with [facility] for behavioral health services and case management.  Follow up with [facility] to obtain replacement ID card.  Refer to the Substance Abuse outpatient referral list for inpatient substance abuse treatment programs including [facility], [facility], Residential treatment services, and [facility].

## 2015-02-25 NOTE — BH Assessment (Signed)
[facility] Assessment Progress Note      Counselor spoke with patient who stated that he was feeling much better than he had been in the past and stated that he is ready to go home now.  Pt shared that he was not feeling suicidal. Homicidal, or experiencing any delusions or hallucinations.  Pt stated that he is currently working with [facility] and planned to follow up with them for counseling services.  Pt stated that he open to exploring inpatient treatment for substance abuse because it is getting colder and he lives in a tent with five other people.  He is not ready to pursue it at this time.  Counselor obtained a cab voucher for client because the [facility] bus is not running to his area today.  Also compiled a resource list of substance abuse treatment programs and local housing options. Client has been under constant supervision during his stay in the observation unit.
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
## 2015-02-25 NOTE — Progress Notes (Signed)
Patient ID: Jimmy BrakemanJack L [name], male   DOB: 09-23-1955, [age] y.o.   MRN: [id number] Patient discharged per MD orders. Patient given education regarding follow-up appointments and medications. Patient denies any questions or concerns about these instructions. Patient was escorted to locker and given belongings before discharge to hospital lobby. Patient currently denies SI/HI and auditory and visual hallucinations on discharge.

## 2015-03-01 ENCOUNTER — Emergency Department (HOSPITAL_COMMUNITY)
Admission: EM | Admit: 2015-03-01 | Discharge: 2015-03-02 | Disposition: A | Discharge status: Other Acute Inpatient Hospital | Payer: Self-pay | Attending: Emergency Medicine | Admitting: Emergency Medicine

## 2015-03-01 ENCOUNTER — Encounter (HOSPITAL_COMMUNITY): Payer: Self-pay

## 2015-03-01 DIAGNOSIS — K219 Gastro-esophageal reflux disease without esophagitis: Secondary | ICD-10-CM | POA: Insufficient documentation

## 2015-03-01 DIAGNOSIS — F332 Major depressive disorder, recurrent severe without psychotic features: Secondary | ICD-10-CM | POA: Diagnosis present

## 2015-03-01 DIAGNOSIS — F1020 Alcohol dependence, uncomplicated: Secondary | ICD-10-CM | POA: Diagnosis present

## 2015-03-01 DIAGNOSIS — F209 Schizophrenia, unspecified: Secondary | ICD-10-CM | POA: Insufficient documentation

## 2015-03-01 DIAGNOSIS — Z8711 Personal history of peptic ulcer disease: Secondary | ICD-10-CM | POA: Insufficient documentation

## 2015-03-01 DIAGNOSIS — F17210 Nicotine dependence, cigarettes, uncomplicated: Secondary | ICD-10-CM | POA: Insufficient documentation

## 2015-03-01 DIAGNOSIS — Z79899 Other long term (current) drug therapy: Secondary | ICD-10-CM | POA: Insufficient documentation

## 2015-03-01 DIAGNOSIS — R45851 Suicidal ideations: Secondary | ICD-10-CM

## 2015-03-01 DIAGNOSIS — F3130 Bipolar disorder, current episode depressed, mild or moderate severity, unspecified: Secondary | ICD-10-CM | POA: Insufficient documentation

## 2015-03-01 LAB — RAPID URINE DRUG SCREEN, HOSP PERFORMED
AMPHETAMINES: NOT DETECTED
BARBITURATES: NOT DETECTED
Benzodiazepines: NOT DETECTED
COCAINE: NOT DETECTED
Opiates: NOT DETECTED
TETRAHYDROCANNABINOL: NOT DETECTED

## 2015-03-01 LAB — CBC
HEMATOCRIT: 39.9 % (ref 39.0–52.0)
HEMOGLOBIN: 13.5 g/dL (ref 13.0–17.0)
MCH: 31.9 pg (ref 26.0–34.0)
MCHC: 33.8 g/dL (ref 30.0–36.0)
MCV: 94.3 fL (ref 78.0–100.0)
Platelets: 301 10*3/uL (ref 150–400)
RBC: 4.23 MIL/uL (ref 4.22–5.81)
RDW: 14.1 % (ref 11.5–15.5)
WBC: 7.2 10*3/uL (ref 4.0–10.5)

## 2015-03-01 LAB — COMPREHENSIVE METABOLIC PANEL
ALBUMIN: 4.3 g/dL (ref 3.5–5.0)
ALK PHOS: 85 U/L (ref 38–126)
ALT: 27 U/L (ref 17–63)
ANION GAP: 10 (ref 5–15)
AST: 47 U/L — AB (ref 15–41)
BILIRUBIN TOTAL: 0.4 mg/dL (ref 0.3–1.2)
BUN: 6 mg/dL (ref 6–20)
CALCIUM: 8.8 mg/dL — AB (ref 8.9–10.3)
CO2: 26 mmol/L (ref 22–32)
Chloride: 99 mmol/L — ABNORMAL LOW (ref 101–111)
Creatinine, Ser: 0.69 mg/dL (ref 0.61–1.24)
GFR calc Af Amer: >60 mL/min (ref >60)
GLUCOSE: 94 mg/dL (ref 65–99)
Potassium: 4.1 mmol/L (ref 3.5–5.1)
Sodium: 135 mmol/L (ref 135–145)
Total Protein: 8.6 g/dL — ABNORMAL HIGH (ref 6.5–8.1)

## 2015-03-01 LAB — SALICYLATE LEVEL: Salicylate Lvl: <4 mg/dL (ref 2.8–30.0)

## 2015-03-01 LAB — ACETAMINOPHEN LEVEL

## 2015-03-01 LAB — ETHANOL: Alcohol, Ethyl (B): 270 mg/dL — ABNORMAL HIGH (ref <5)

## 2015-03-01 MED ORDER — TRAZODONE HCL 50 MG PO TABS
50.0000 mg | ORAL_TABLET | Freq: Every day | ORAL | Status: DC
  Administered 2015-03-02: 50 mg via ORAL
  Filled 2015-03-01: qty 1

## 2015-03-01 MED ORDER — FAMOTIDINE 20 MG PO TABS
20.0000 mg | ORAL_TABLET | Freq: Two times a day (BID) | ORAL | Status: DC
  Administered 2015-03-02 (×2): 20 mg via ORAL
  Filled 2015-03-01 (×2): qty 1

## 2015-03-01 MED ORDER — NICOTINE 21 MG/24HR TD PT24
21.0000 mg | MEDICATED_PATCH | Freq: Every day | TRANSDERMAL | Status: DC
  Administered 2015-03-02: 21 mg via TRANSDERMAL
  Filled 2015-03-01: qty 1

## 2015-03-01 MED ORDER — LURASIDONE HCL 20 MG PO TABS
20.0000 mg | ORAL_TABLET | Freq: Every day | ORAL | Status: DC
  Administered 2015-03-02: 20 mg via ORAL
  Filled 2015-03-01: qty 1

## 2015-03-01 NOTE — BH Assessment (Signed)
Assessment completed. Consulted Hulan FessIjeoma Nwaeze, NP who recommended that pt be evaluated by psychiatry for final disposition. Informed Noelle PennerSerena Sam, PA-C has been informed of the recommendation.
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
## 2015-03-01 NOTE — ED Notes (Signed)
Pt has in his belongings bag a pair of blue jean pants, a grey shirt and a red flannel shirt, a pair of brown shoes a pair of black socks, a blue jacket and a tan jacket, pt also has a black bookbag.
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
## 2015-03-01 NOTE — ED Notes (Signed)
Pt states that he is suicidal so he called [employer], pt is voluntary

## 2015-03-02 ENCOUNTER — Inpatient Hospital Stay (HOSPITAL_COMMUNITY)
Admission: AD | Admit: 2015-03-02 | Discharge: 2015-03-05 | DRG: 885 | Disposition: A | Discharge status: Home / Self Care | Payer: Federal, State, Local not specified - Other | Source: Intra-hospital | Attending: Psychiatry | Admitting: Psychiatry

## 2015-03-02 ENCOUNTER — Encounter (HOSPITAL_COMMUNITY): Payer: Self-pay | Admitting: *Deleted

## 2015-03-02 DIAGNOSIS — R45851 Suicidal ideations: Secondary | ICD-10-CM

## 2015-03-02 DIAGNOSIS — F17210 Nicotine dependence, cigarettes, uncomplicated: Secondary | ICD-10-CM | POA: Diagnosis present

## 2015-03-02 DIAGNOSIS — F332 Major depressive disorder, recurrent severe without psychotic features: Secondary | ICD-10-CM

## 2015-03-02 DIAGNOSIS — F10121 Alcohol abuse with intoxication delirium: Secondary | ICD-10-CM | POA: Diagnosis not present

## 2015-03-02 DIAGNOSIS — F333 Major depressive disorder, recurrent, severe with psychotic symptoms: Secondary | ICD-10-CM

## 2015-03-02 DIAGNOSIS — F10129 Alcohol abuse with intoxication, unspecified: Secondary | ICD-10-CM | POA: Diagnosis present

## 2015-03-02 DIAGNOSIS — F1020 Alcohol dependence, uncomplicated: Secondary | ICD-10-CM | POA: Diagnosis present

## 2015-03-02 DIAGNOSIS — F10230 Alcohol dependence with withdrawal, uncomplicated: Secondary | ICD-10-CM | POA: Diagnosis not present

## 2015-03-02 MED ORDER — ACETAMINOPHEN 325 MG PO TABS: 650.0000 mg | ORAL_TABLET | Freq: Four times a day (QID) | ORAL | Status: DC | PRN

## 2015-03-02 MED ORDER — LORAZEPAM 1 MG PO TABS: 1.0000 mg | ORAL_TABLET | Freq: Four times a day (QID) | ORAL | Status: DC | PRN

## 2015-03-02 MED ORDER — FOLIC ACID 1 MG PO TABS
1.0000 mg | ORAL_TABLET | Freq: Every day | ORAL | Status: DC
  Administered 2015-03-02: 1 mg via ORAL
  Filled 2015-03-02: qty 1

## 2015-03-02 MED ORDER — LORAZEPAM 1 MG PO TABS
0.0000 mg | ORAL_TABLET | Freq: Four times a day (QID) | ORAL | Status: DC
  Filled 2015-03-02: qty 1

## 2015-03-02 MED ORDER — TRAZODONE HCL 100 MG PO TABS
100.0000 mg | ORAL_TABLET | Freq: Every day | ORAL | Status: DC
  Administered 2015-03-03 – 2015-03-04 (×2): 100 mg via ORAL
  Filled 2015-03-02: qty 7
  Filled 2015-03-02 (×5): qty 1

## 2015-03-02 MED ORDER — LORAZEPAM 2 MG/ML IJ SOLN: 1.0000 mg | Freq: Four times a day (QID) | INTRAMUSCULAR | Status: DC | PRN

## 2015-03-02 MED ORDER — ADULT MULTIVITAMIN W/MINERALS CH
1.0000 | ORAL_TABLET | Freq: Every day | ORAL | Status: DC
  Administered 2015-03-03 – 2015-03-05 (×3): 1 via ORAL
  Filled 2015-03-02: qty 1
  Filled 2015-03-02: qty 7
  Filled 2015-03-02 (×4): qty 1

## 2015-03-02 MED ORDER — FOLIC ACID 1 MG PO TABS
1.0000 mg | ORAL_TABLET | Freq: Every day | ORAL | Status: DC
  Administered 2015-03-03 – 2015-03-05 (×3): 1 mg via ORAL
  Filled 2015-03-02: qty 1
  Filled 2015-03-02: qty 7
  Filled 2015-03-02 (×4): qty 1

## 2015-03-02 MED ORDER — FAMOTIDINE 20 MG PO TABS
20.0000 mg | ORAL_TABLET | Freq: Two times a day (BID) | ORAL | Status: DC
  Administered 2015-03-03 – 2015-03-05 (×5): 20 mg via ORAL
  Filled 2015-03-02 (×2): qty 1
  Filled 2015-03-02: qty 14
  Filled 2015-03-02 (×8): qty 1
  Filled 2015-03-02: qty 14

## 2015-03-02 MED ORDER — TRAZODONE HCL 100 MG PO TABS: 100.0000 mg | ORAL_TABLET | Freq: Every day | ORAL | Status: DC

## 2015-03-02 MED ORDER — LORAZEPAM 1 MG PO TABS
0.0000 mg | ORAL_TABLET | Freq: Four times a day (QID) | ORAL | Status: DC
Start: 2015-03-02 — End: 2015-03-02

## 2015-03-02 MED ORDER — VITAMIN B-1 100 MG PO TABS
100.0000 mg | ORAL_TABLET | Freq: Every day | ORAL | Status: DC
  Administered 2015-03-03 – 2015-03-05 (×3): 100 mg via ORAL
  Filled 2015-03-02 (×6): qty 1

## 2015-03-02 MED ORDER — VITAMIN B-1 100 MG PO TABS
100.0000 mg | ORAL_TABLET | Freq: Every day | ORAL | Status: DC
  Administered 2015-03-02: 100 mg via ORAL
  Filled 2015-03-02: qty 1

## 2015-03-02 MED ORDER — MAGNESIUM HYDROXIDE 400 MG/5ML PO SUSP: 30.0000 mL | Freq: Every day | ORAL | Status: DC | PRN

## 2015-03-02 MED ORDER — NICOTINE 21 MG/24HR TD PT24
21.0000 mg | MEDICATED_PATCH | Freq: Every day | TRANSDERMAL | Status: DC
  Administered 2015-03-03 – 2015-03-05 (×3): 21 mg via TRANSDERMAL
  Filled 2015-03-02 (×6): qty 1

## 2015-03-02 MED ORDER — LORAZEPAM 1 MG PO TABS
1.0000 mg | ORAL_TABLET | ORAL | Status: DC | PRN
  Administered 2015-03-02: 1 mg via ORAL
  Filled 2015-03-02: qty 1

## 2015-03-02 MED ORDER — HALOPERIDOL 1 MG PO TABS
1.0000 mg | ORAL_TABLET | Freq: Two times a day (BID) | ORAL | Status: DC
  Administered 2015-03-02 – 2015-03-05 (×6): 1 mg via ORAL
  Filled 2015-03-02 (×9): qty 1
  Filled 2015-03-02: qty 14
  Filled 2015-03-02: qty 1
  Filled 2015-03-02: qty 14

## 2015-03-02 MED ORDER — THIAMINE HCL 100 MG/ML IJ SOLN
100.0000 mg | Freq: Every day | INTRAMUSCULAR | Status: DC
Start: 2015-03-03 — End: 2015-03-03

## 2015-03-02 MED ORDER — THIAMINE HCL 100 MG/ML IJ SOLN: 100.0000 mg | Freq: Every day | INTRAMUSCULAR | Status: DC

## 2015-03-02 MED ORDER — ALUM & MAG HYDROXIDE-SIMETH 200-200-20 MG/5ML PO SUSP: 30.0000 mL | ORAL | Status: DC | PRN

## 2015-03-02 MED ORDER — LORAZEPAM 1 MG PO TABS
0.0000 mg | ORAL_TABLET | Freq: Two times a day (BID) | ORAL | Status: DC
Start: 2015-03-04 — End: 2015-03-02

## 2015-03-02 MED ORDER — ADULT MULTIVITAMIN W/MINERALS CH
1.0000 | ORAL_TABLET | Freq: Every day | ORAL | Status: DC
  Administered 2015-03-02: 1 via ORAL
  Filled 2015-03-02: qty 1

## 2015-03-02 MED ORDER — LORAZEPAM 1 MG PO TABS
0.0000 mg | ORAL_TABLET | Freq: Two times a day (BID) | ORAL | Status: DC
  Administered 2015-03-02: 1 mg via ORAL

## 2015-03-02 MED ORDER — HALOPERIDOL 1 MG PO TABS
1.0000 mg | ORAL_TABLET | Freq: Two times a day (BID) | ORAL | Status: DC
  Administered 2015-03-02: 1 mg via ORAL
  Filled 2015-03-02: qty 1

## 2015-03-02 NOTE — Progress Notes (Addendum)
[facility]  Pt states he has not obtained a pcp since this ED CM spoke with him in June 2016  Pt tells Cm again today "I have no money" CM informs pt of discounted uninsured doctors CM spoke with pt who [place].  CM discussed and provided written information for uninsured accepting pcps, discussed the importance of pcp vs EDP services for f/u care, www.needymeds.org, www.goodrx.com, discounted pharmacies and other [place] resources such as [facility] , [facility], affordable care act, financial assistance, uninsured dental services, [place] med assist, DSS and  health department  Reviewed resources for [place] uninsured accepting pcps like [facility], [facility] medicine [facility] [address], [facility], local urgent care centers, [facility], [facility], general medical clinics, [facility] [place], [facility] plus others, medication resources, [facility] out patient pharmacies and housing Pt voiced understanding and appreciation of resources provided   Provided P4CC contact information Pt Informed about [facility] Pt states [facility] is [facility]

## 2015-03-02 NOTE — Progress Notes (Signed)
Jimmy [name] is  A 59 yo caucasian homeless male who [facility]  [facility] and suiiiiiiiicidal ideations. He says " 14 years ago I lost my job" and [name] been homeless ever since. He prefaces this with telling me it all started " 14 years ago when  I lost my wife . He deneis active SI now and contracts with this nurse for safety. He says he started drinking alcohol when he was [age] years ol, he deneis any other drug issues and says he has a PMH of schizophrenia also. He deneis withdrawal complications but does admit to this writer that he " has seizures" when he detoxes, he cannot remember when his last seizure was. He contracts for safety and is admitted to the unit.

## 2015-03-02 NOTE — ED Notes (Signed)
Patient pleasant on approach today. Reports increased depression with passive SI. States he is hearing voices that say bad things to him. He was just started on Haldol 1mg  PO. Also given some ativan due to alcohol abuse. CIWA at 5 at present. Cooperative on the unit.

## 2015-03-02 NOTE — ED Notes (Signed)
[facility].
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
## 2015-03-02 NOTE — Progress Notes (Signed)
Pt did not attend evening AA group because pt was asleep.  

## 2015-03-02 NOTE — ED Notes (Signed)
[facility]. Patient denies HI and AVH at this time. Encouragement and support provided and safety maintain. Q 15 min safety checks in place.

## 2015-03-02 NOTE — BH Assessment (Signed)
[facility] Assessment Progress Note  Per Thedore MinsMojeed Akintayo, MD, this pt requires psychiatric hospitalization at this time.  Berneice Heinrichina Tate, RN, [facility] Rm 300-2.  Pt has signed Voluntary Admission and Consent for Treatment, as well as Consent to Release Information to no one, and signed [facility].  [name] nurse, Victorino DikeJennifer, has been notified, and agrees to send original paperwork along with pt via [facility] [place], and to call report to [phone].  Doylene Canninghomas Kiernan Atkerson, MA Triage Specialist [phone]

## 2015-03-03 DIAGNOSIS — F333 Major depressive disorder, recurrent, severe with psychotic symptoms: Secondary | ICD-10-CM

## 2015-03-03 DIAGNOSIS — F10121 Alcohol abuse with intoxication delirium: Secondary | ICD-10-CM

## 2015-03-03 MED ORDER — HYDROXYZINE HCL 25 MG PO TABS
25.0000 mg | ORAL_TABLET | Freq: Four times a day (QID) | ORAL | Status: DC | PRN
  Filled 2015-03-03: qty 10

## 2015-03-03 MED ORDER — LORAZEPAM 1 MG PO TABS
1.0000 mg | ORAL_TABLET | Freq: Four times a day (QID) | ORAL | Status: AC
  Administered 2015-03-03 (×4): 1 mg via ORAL
  Filled 2015-03-03 (×4): qty 1

## 2015-03-03 MED ORDER — LORAZEPAM 1 MG PO TABS
1.0000 mg | ORAL_TABLET | Freq: Three times a day (TID) | ORAL | Status: AC
  Administered 2015-03-04 (×3): 1 mg via ORAL
  Filled 2015-03-03 (×3): qty 1

## 2015-03-03 MED ORDER — LORAZEPAM 1 MG PO TABS
1.0000 mg | ORAL_TABLET | Freq: Two times a day (BID) | ORAL | Status: DC
  Administered 2015-03-05: 1 mg via ORAL
  Filled 2015-03-03: qty 1

## 2015-03-03 MED ORDER — LOPERAMIDE HCL 2 MG PO CAPS: 2.0000 mg | ORAL_CAPSULE | ORAL | Status: DC | PRN

## 2015-03-03 MED ORDER — LORAZEPAM 1 MG PO TABS: 1.0000 mg | ORAL_TABLET | Freq: Four times a day (QID) | ORAL | Status: DC | PRN

## 2015-03-03 MED ORDER — ONDANSETRON 4 MG PO TBDP: 4.0000 mg | ORAL_TABLET | Freq: Four times a day (QID) | ORAL | Status: DC | PRN

## 2015-03-03 MED ORDER — LORAZEPAM 1 MG PO TABS: 1.0000 mg | ORAL_TABLET | Freq: Every day | ORAL | Status: DC
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
## 2015-03-03 NOTE — [facility] Suicide Risk Assessment (Signed)
[facility] Admission Suicide Risk Assessment   Nursing information obtained from:  Patient Demographic factors:  Male Current Mental Status:  Suicidal ideation indicated by patient Loss Factors:  Decrease in vocational status, Loss of significant relationship, Decline in physical health, Financial problems / change in socioeconomic status Historical Factors:  Prior suicide attempts, Anniversary of important loss Risk Reduction Factors:  Sense of responsibility to family Total Time spent with patient: 45 minutes Principal Problem: <principal problem not specified> Diagnosis:   Patient Active Problem List   Diagnosis Date Noted  . Suicidal ideation [R45.851] 03/02/2015  . Major depressive disorder, recurrent episode, moderate (HCC) [F33.1] 02/24/2015  . Alcohol-induced mood disorder (HCC) [F10.94] 02/24/2015  . MDD (major depressive disorder), recurrent severe, without psychosis (HCC) [F33.2] 02/24/2015  . Alcohol intoxication (HCC) [F10.129]   . Polysubstance abuse [F19.10] 09/06/2014  . GIB (gastrointestinal bleeding) [K92.2] 09/06/2014  . Homeless [Z59.0] 09/06/2014  . GERD (gastroesophageal reflux disease) [K21.9]   . Schizophrenia (HCC) [F20.9]   . PUD (peptic ulcer disease) [K27.9]   . Gastroesophageal reflux disease without esophagitis [K21.9]   . Hypokalemia [E87.6]   . Alcohol dependence with uncomplicated withdrawal (HCC) [F10.230] 08/11/2014  . Alcohol abuse [F10.10] 12/05/2013  . S/P alcohol detoxification [Z09] 12/01/2013  . Alcohol abuse with intoxication, uncomplicated (HCC) [F10.120] 10/04/2013  . Alcohol withdrawal (HCC) [F10.239] 09/01/2013  . Seizure (HCC) [R56.9] 08/24/2013  . Bipolar disorder (HCC) [F31.9] 08/19/2013  . Tobacco use disorder [F17.200] 08/19/2013  . Malnutrition of moderate degree (HCC) [E44.0] 08/17/2013  . Biliary colic [K80.50] 08/16/2013  . Alcohol dependence (HCC) [F10.20] 10/23/2011  . Cannabis abuse [F12.10] 10/23/2011     Continued Clinical  Symptoms:  Alcohol Use Disorder Identification Test Final Score (AUDIT): 3 The "Alcohol Use Disorders Identification Test", Guidelines for Use in Primary Care, Second Edition.  World Science writer [facility]). Score between 0-7:  no or low risk or alcohol related problems. Score between 8-15:  moderate risk of alcohol related problems. Score between 16-19:  high risk of alcohol related problems. Score 20 or above:  warrants further diagnostic evaluation for alcohol dependence and treatment.   CLINICAL FACTORS:   Depression:   Hopelessness Impulsivity Alcohol/Substance Abuse/Dependencies Schizophrenia:   Depressive state More than one psychiatric diagnosis Currently Psychotic   Musculoskeletal: Strength & Muscle Tone: within normal limits Gait & Station: normal Patient leans: N/A  Psychiatric Specialty Exam: Physical Exam  ROS  Blood pressure 128/73, pulse 72, temperature 97.9 F (36.6 C), temperature source Oral, resp. rate 12, height  (1.727 m), weight 78.472 kg (173 lb), SpO2 100 %.Body mass index is 26.31 kg/(m^2).  General Appearance: Casual and Fairly Groomed  Patent attorney::  Fair  Speech:  Slow  Volume:  Decreased  Mood:  Anxious, Dysphoric and Irritable  Affect:  Constricted and Depressed  Thought Process:  Logical  Orientation:  Full (Time, Place, and Person)  Thought Content:  Hallucinations: Auditory and Rumination  Suicidal Thoughts:  Yes.  without intent/plan  Homicidal Thoughts:  No  Memory:  Immediate;   Fair Recent;   Fair Remote;   Fair  Judgement:  Fair  Insight:  Lacking  Psychomotor Activity:  Tremor  Concentration:  Fair  Recall:  [employer] of Knowledge:Good  Language: Good  Akathisia:  No  Handed:  Right  AIMS (if indicated):     Assets:  Communication Skills Desire for Improvement Physical Health Social Support  Sleep:  Number of Hours: 6.75  Cognition: WNL  ADL's:  Intact  COGNITIVE FEATURES THAT CONTRIBUTE TO RISK:   Polarized thinking and Thought constriction (tunnel vision)    SUICIDE RISK:   Mild:  Suicidal ideation of limited frequency, intensity, duration, and specificity.  There are no identifiable plans, no associated intent, mild dysphoria and related symptoms, good self-control (both objective and subjective assessment), few other risk factors, and identifiable protective factors, including available and accessible social support.  PLAN OF CARE: Patient is 59 year old Caucasian man who is currently unemployed admitted because of having suicidal thoughts and having hallucinations.  [place] and he was very upset in cold weather.  He admitted drinking and his blood alcohol level is 270.  He has mild elevation of liver enzymes.  Patient has history of attempted suicide twice in the past.  Currently he is receiving treatment and [facility].  He endorse having hallucinations and persistent suicidal thoughts with no plan.  Patient needs inpatient treatment and stabilization.  Please see history and physical for more details.  Medical Decision Making:  Established Problem, Stable/Improving (1), New problem, with additional work up planned, Review of Psycho-Social Stressors (1), Review or order clinical lab tests (1), Decision to obtain old records (1), Review and summation of old records (2), Established Problem, Worsening (2), Review of Medication Regimen & Side Effects (2) and Review of New Medication or Change in Dosage (2)  I certify that inpatient services furnished can reasonably be expected to improve the patient's condition.   Kween Bacorn T. 03/03/2015, 2:32 PM

## 2015-03-03 NOTE — Progress Notes (Signed)
[facility] Group Notes:  (Nursing/MHT/Case Management/Adjunct)  Date:  03/03/2015  Time:  2100  Type of Therapy:  wrap up group  Participation Level:  Active  Participation Quality:  Appropriate, Attentive, Sharing and Supportive  Affect:  Appropriate  Cognitive:  Appropriate  Insight:  Improving  Engagement in Group:  Engaged  Modes of Intervention:  Clarification, Education and Support  Summary of Progress/Problems:  Jimmy [name], Jimmy [name] 03/03/2015, 10:07 PM

## 2015-03-03 NOTE — [facility] Suicide Risk Assessment (Signed)
[facility] INPATIENT:  Family/Significant Other Suicide Prevention Education  Suicide Prevention Education:  Patient Refusal for Family/Significant Other Suicide Prevention Education: The patient Jimmy [name] has refused to provide written consent for family/significant other to be provided Family/Significant Other Suicide Prevention Education during admission and/or prior to discharge.  Physician notified.  Sarina SerGrossman-Orr, Braiden Presutti Jo 03/03/2015, 4:12 PM

## 2015-03-03 NOTE — Plan of Care (Signed)
Problem: Diagnosis: Increased Risk For Suicide Attempt Goal: STG-Patient Will Comply With Medication Regime Outcome: Progressing Pt compliant with medication regimen     

## 2015-03-03 NOTE — Progress Notes (Signed)
D   Pt is appropriate and pleasant on approach   He denies withdrawal symptoms at present   He has been visible on the milieu and interacts well with others   He requested something to help him sleep  A   Verbal support given   Medications administered and effectiveness monitored   Q 15 min checks R   Pt safe at present

## 2015-03-03 NOTE — [facility] Group Notes (Signed)
[facility] Group Notes:  (Clinical Social Work)   01/20/2015     10:00-11:00AM  Summary of Progress/Problems:   In today's process group a decisional balance exercise was used to explore in depth the perceived benefits and costs of alcohol and drugs, as well as the  benefits and costs of replacing these with healthy coping skills.  Patients listed healthy and unhealthy coping techniques, determining with CSW guidance that unhealthy coping techniques work initially, but eventually become harmful.  Motivational Interviewing and the whiteboard were utilized for the exercises.  The patient expressed that he is in the hospital for a medication change, and stated he does drink alcohol but it is not a problem for him.  He did participate in the discussion, but did not seem to identify this as relating to him directly.  He was called out to see a practitioner during group.  Type of Therapy:  Group Therapy - Process   Participation Level:  Active  Participation Quality:  Attentive  Affect:  Blunted and Depressed  Cognitive:  Appropriate  Insight:  Developing/Improving  Engagement in Therapy:  Engaged  Modes of Intervention:  Education, Motivational Interviewing  Jimmy MantleMareida Grossman-Orr, LCSW 03/03/2015, 12:36 PM
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
## 2015-03-03 NOTE — Progress Notes (Signed)
D: Per patient self inventory form pt reports he slept good last night with the use of sleep medication. He reports a good appetite, normal energy level, good concentration. He rates depression 2/10, hopelessness 5/10, anxiety 1/10- all on 0-10 scale, 10  Being the worse. Pt denies SI on self inventory form, denies HI. Denies AVH. Pt reports his goal is "survival" and that he will "anything do what I am supposed to do" to help meet his goal today. Pt denies withdrawal symptoms. Pt verbalizes passive SI, pt verbally contract for safety. Denies plan to hurt self, reports he feels safe at the hospital. Pt observed in the day room socializing with peers. Pt did not attend nursing group, Denies pain.   A:Special checks q 15 mins in place for safety. Medication administered per MD order (see eMAR) Encouragement and support provided. CIWA protocol in place.  R:Safety maintained. Compliant with medication regimen. Will continue to monitor.

## 2015-03-03 NOTE — Progress Notes (Signed)
D   Pt spent most of the shift in bed   He did not wake up for his bedtime medications and has had no complaints of withdrawal A   Verbal support offered    Medications offered   Q 15 min checks R   Pt safe at present

## 2015-03-03 NOTE — Tx Team (Signed)
Initial Interdisciplinary Treatment Plan   PATIENT STRESSORS: Educational concerns Financial difficulties Health problems Substance abuse Traumatic event   PATIENT STRENGTHS: Ability for insight Active sense of humor Average or above average intelligence Capable of independent living   PROBLEM LIST: Problem List/Patient Goals Date to be addressed Date deferred Reason deferred Estimated date of resolution  Suicidal Ideation 2' Alcoholism 12/09016     Shizophrenia 03/02/2015                 " Im homeless" 03/02/2015     " I lost my wife 14 years ago" 03/02/2015                        DISCHARGE CRITERIA:  Ability to meet basic life and health needs Adequate post-discharge living arrangements Improved stabilization in mood, thinking, and/or behavior Medical problems require only outpatient monitoring  PRELIMINARY DISCHARGE PLAN: Attend aftercare/continuing care group Attend PHP/IOP Attend 12-step recovery group Outpatient therapy  PATIENT/FAMIILY INVOLVEMENT: This treatment plan has been presented to and reviewed with the patient, Jimmy [name], and/or family member, .  The patient and family have been given the opportunity to ask questions and make suggestions.  Rich BraveDuke, Charnice Zwilling Lynn 03/03/2015, 10:39 AM

## 2015-03-03 NOTE — [facility] Group Notes (Signed)
[facility] Group Notes:  (Nursing/MHT/Case Management/Adjunct)  Date:  03/03/2015  Time: 0930  Type of Therapy:  Nurse Education  Participation Level:  Did Not Attend    Jimmy [name] N Dynasia Kercheval 03/03/2015, 10:26 AM 

## 2015-03-04 DIAGNOSIS — F332 Major depressive disorder, recurrent severe without psychotic features: Principal | ICD-10-CM

## 2015-03-04 DIAGNOSIS — F1020 Alcohol dependence, uncomplicated: Secondary | ICD-10-CM

## 2015-03-04 MED ORDER — LORATADINE 10 MG PO TABS
10.0000 mg | ORAL_TABLET | Freq: Every day | ORAL | Status: DC
  Administered 2015-03-04 – 2015-03-05 (×2): 10 mg via ORAL
  Filled 2015-03-04: qty 7
  Filled 2015-03-04 (×5): qty 1
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
## 2015-03-04 NOTE — Progress Notes (Signed)
Patient did attend the evening speaker AA meeting.  

## 2015-03-04 NOTE — Progress Notes (Signed)
D: Patient in the dayroom on approach.  Patient states he had a good day.  Patient states his goal for today was to continue on.  Patient states he met his goal.  Patient denies SI/HI and denies AVH.   A: Staff to monitor Q 15 mins for safety.  Encouragement and support offered.  Scheduled medications administered per orders. R: Patient remains safe on the unit.  Patient attended group tonight.  Patient visible on the unit and interacting with peers.  Patient taking administered medications.

## 2015-03-04 NOTE — Plan of Care (Signed)
Problem: Alteration in mood & ability to function due to Goal: STG-Patient will comply with prescribed medication regimen (Patient will comply with prescribed medication regimen)  Outcome: Progressing Pt compliant with medication regimen     

## 2015-03-04 NOTE — Progress Notes (Signed)
D:Per patient self inventory form pt reports he slept good last night with the use of sleep medication. He reports a good appetite, normal energy level, good concentration. He rates depression 2/10, hopelesness 4/10, anxiety 0/10- all on 0-10 scale, 10 being the worse. Pt presents with mild tremor. Pt denies SI/HI. Denies AVH. Denies physical problems. Pt reports his goal for the day is "finding a place to stay." Pt laughing inappropriately at times, pleasant. Did not attend nursing group.  A:special checks q 15 mins in place for safety. Medication administered per MD order(See eMAR) Encouragement and support provided.  R:Compliant with medication. Safety maintained. Will continue to monitor.

## 2015-03-04 NOTE — [facility] Group Notes (Signed)
[facility] Group Notes:  (Nursing/MHT/Case Management/Adjunct)  Date:  03/04/2015  Time:0930  Type of Therapy:  Nurse Education  Participation Level:  Did Not Attend    Summary of Progress/Problems:  Dara Hoyershley N Sanika Brosious 03/04/2015, 10:15 AM

## 2015-03-04 NOTE — [facility] Group Notes (Signed)
[facility] Group Notes:  (Clinical Social Work)  03/04/2015  10:00-11:00AM  Summary of Progress/Problems:   The main focus of today's process group was to   1)  discuss the importance of adding supports  2)  define health supports versus unhealthy supports  3)  identify the patient's current unhealthy supports and plan how to handle them  4)  Identify the patient's current healthy supports and plan what to add.  An emphasis was placed on using counselor, doctor, therapy groups, 12-step groups, and problem-specific support groups to expand supports.    The patient expressed full comprehension of the concepts presented, and agreed that there is a need to add more supports.  The patient stated he would like to find out if it would be possible for him to go to [facility] [place] "immediately" and, if not, will go to shelter.  York SpanielSaid would consider [facility] if they would give him a way to make money and buy cigarettes.  Does not want to quit smoking.  Type of Therapy:  Process Group with Motivational Interviewing  Participation Level:  Active  Participation Quality:  Attentive and Sharing  Affect:  Blunted  Cognitive:  Oriented  Insight:  Developing/Improving  Engagement in Therapy:  Engaged  Modes of Intervention:   Education, Support and Processing, Activity  Ambrose MantleMareida Grossman-Orr, LCSW 03/04/2015

## 2015-03-05 ENCOUNTER — Encounter (HOSPITAL_COMMUNITY): Payer: Self-pay | Admitting: Psychiatry

## 2015-03-05 DIAGNOSIS — F10230 Alcohol dependence with withdrawal, uncomplicated: Secondary | ICD-10-CM

## 2015-03-05 DIAGNOSIS — F333 Major depressive disorder, recurrent, severe with psychotic symptoms: Secondary | ICD-10-CM

## 2015-03-05 MED ORDER — HALOPERIDOL 1 MG PO TABS: 1.0000 mg | ORAL_TABLET | Freq: Two times a day (BID) | ORAL | Status: DC

## 2015-03-05 MED ORDER — FAMOTIDINE 20 MG PO TABS: 20.0000 mg | ORAL_TABLET | Freq: Two times a day (BID) | ORAL | Status: DC

## 2015-03-05 MED ORDER — NICOTINE 21 MG/24HR TD PT24: 21.0000 mg | MEDICATED_PATCH | Freq: Every day | TRANSDERMAL | Status: DC

## 2015-03-05 MED ORDER — TRAZODONE HCL 100 MG PO TABS: 100.0000 mg | ORAL_TABLET | Freq: Every day | ORAL | Status: DC

## 2015-03-05 MED ORDER — HYDROXYZINE HCL 25 MG PO TABS: 25.0000 mg | ORAL_TABLET | Freq: Four times a day (QID) | ORAL | Status: DC | PRN
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
## 2015-03-05 NOTE — BHH Suicide Risk Assessment (Signed)
[facility]   Demographic Factors:  Male and Caucasian  Total Time spent with patient: 30 minutes  Musculoskeletal: Strength & Muscle Tone: within normal limits Gait & Station: normal Patient leans: normal  Psychiatric Specialty Exam: Physical Exam  Review of Systems  Constitutional: Negative.   HENT: Negative.   Eyes: Negative.   Respiratory: Negative.   Cardiovascular: Negative.   Gastrointestinal: Negative.   Genitourinary: Negative.   Musculoskeletal: Negative.   Skin: Negative.   Neurological: Negative.   Endo/Heme/Allergies: Negative.   Psychiatric/Behavioral: Positive for depression and substance abuse.    Blood pressure 110/75, pulse 95, temperature 97.6 F (36.4 C), temperature source Oral, resp. rate 20, height 5\' 8"  (1.727 m), weight 78.472 kg (173 lb), SpO2 100 %.Body mass index is 26.31 kg/(m^2).  General Appearance: Fairly Groomed  Patent attorneyye Contact::  Fair  Speech:  Clear and Coherent409  Volume:  Decreased  Mood:  Euthymic  Affect:  Restricted  Thought Process:  Coherent and Goal Directed  Orientation:  Full (Time, Place, and Person)  Thought Content:  plans as he moves on  Suicidal Thoughts:  No  Homicidal Thoughts:  No  Memory:  Immediate;   Fair Recent;   Fair Remote;   Fair  Judgement:  Fair  Insight:  Present  Psychomotor Activity:  Normal  Concentration:  Fair  Recall:  FiservFair  Fund of Knowledge:Fair  Language: Fair  Akathisia:  No  Handed:  Right  AIMS (if indicated):     Assets:  Desire for Improvement Housing Social Support  Sleep:  Number of Hours: 6  Cognition: WNL  ADL's:  Intact   Have you used any form of tobacco in the last 30 days? (Cigarettes, Smokeless Tobacco, Cigars, and/or Pipes): Yes  Has this patient used any form of tobacco in the last 30 days? (Cigarettes, Smokeless Tobacco, Cigars, and/or Pipes) Yes, A prescription for an FDA-approved tobacco cessation medication was offered at discharge and the  patient refused  Mental Status Per Nursing Assessment::   On Admission:  Suicidal ideation indicated by patient  Current Mental Status by Physician: In full contact with reality. There are no active S/S of withdrawal. There are no active SI plans or intent. He states that the JordanLatuda was not helping the voices and that as soon as he was started on the Haldol the voices went away. He is not committing to total abstinence. He is willing to do some control drinking. He is living with his brothers and they all drink.   Loss Factors: NA  Historical Factors: NA  Risk Reduction Factors:   Living with another person, especially a relative and Positive social support  Continued Clinical Symptoms:  Depression:   Comorbid alcohol abuse/dependence Alcohol/Substance Abuse/Dependencies  Cognitive Features That Contribute To Risk:  Closed-mindedness, Polarized thinking and Thought constriction (tunnel vision)    Suicide Risk:  Minimal: No identifiable suicidal ideation.  Patients presenting with no risk factors but with morbid ruminations; may be classified as minimal risk based on the severity of the depressive symptoms  Principal Problem: MDD (major depressive disorder), recurrent severe, without psychosis [facility](HCC) Discharge Diagnoses:  Patient Active Problem List   Diagnosis Date Noted  . Severe episode of recurrent major depressive disorder, with psychotic features (HCC) [F33.3]   . Suicidal ideation [R45.851] 03/02/2015  . Major depressive disorder, recurrent episode, moderate (HCC) [F33.1] 02/24/2015  . Alcohol-induced mood disorder (HCC) [F10.94] 02/24/2015  . MDD (major depressive disorder), recurrent severe, without psychosis (HCC) [F33.2] 02/24/2015  .  Alcohol intoxication (HCC) [F10.129]   . Polysubstance abuse [F19.10] 09/06/2014  . GIB (gastrointestinal bleeding) [K92.2] 09/06/2014  . Homeless [Z59.0] 09/06/2014  . GERD (gastroesophageal reflux disease) [K21.9]   . Schizophrenia  (HCC) [F20.9]   . PUD (peptic ulcer disease) [K27.9]   . Gastroesophageal reflux disease without esophagitis [K21.9]   . Hypokalemia [E87.6]   . Alcohol dependence with uncomplicated withdrawal (HCC) [F10.230] 08/11/2014  . Alcohol abuse [F10.10] 12/05/2013  . S/P alcohol detoxification [Z09] 12/01/2013  . Alcohol abuse with intoxication, uncomplicated (HCC) [F10.120] 10/04/2013  . Alcohol withdrawal (HCC) [F10.239] 09/01/2013  . Seizure (HCC) [R56.9] 08/24/2013  . Bipolar disorder (HCC) [F31.9] 08/19/2013  . Tobacco use disorder [F17.200] 08/19/2013  . Malnutrition of moderate degree (HCC) [E44.0] 08/17/2013  . Biliary colic [K80.50] 08/16/2013  . Alcohol dependence (HCC) [F10.20] 10/23/2011  . Cannabis abuse [F12.10] 10/23/2011    Follow-up Information    Follow up with [facility].   Specialty:  Behavioral Health   Why:  Walk in between 8am-9am Monday through Friday for hospital follow-up/medication management/assessment for counseling services.    Contact information:   [address] [place] [address] [phone]       Plan Of Care/Follow-up recommendations:  Activity:  as tolerated Diet:  regular Follow up with [facility] as above Is patient on multiple antipsychotic therapies at discharge:  No   Has Patient had three or more failed trials of antipsychotic monotherapy by history:  No  Recommended Plan for Multiple Antipsychotic Therapies: NA    Nanie Dunkleberger A 03/05/2015, 1:26 PM

## 2015-03-05 NOTE — Plan of Care (Signed)
Problem: Alteration in mood & ability to function due to Goal: LTG-Pt reports reduction in suicidal thoughts (Patient reports reduction in suicidal thoughts and is able to verbalize a safety plan for whenever patient is feeling suicidal)  Outcome: Progressing Patient denies SI.  Patient verbally contracts for safety.     

## 2015-03-05 NOTE — Progress Notes (Signed)
  [facility] :  Will you be returning to the same living situation after discharge:  No.[facility]  At discharge, do you have transportation home?: Yes,  bus pass in chart. Do you have the ability to pay for your medications: Yes,  mental health  Release of information consent forms completed and submitted to medical records by CSW.  Patient to Follow up at: Follow-up Information    [facility]   Specialty:  Behavioral Health   Why:  Walk in between 8am-9am Monday through Friday for hospital follow-up/medication management/assessment for counseling services.    Contact information:   [address] [place] [address] [phone]       Next level of care provider has [facility] Link:no  Patient denies SI/HI: Yes,  during group/self report.    Safety Planning and Suicide Prevention discussed: Yes,  SPE completed with pt, as he refused to consent to family contact. SPI pamphlet and Mobile Crisis information provided to pt and he was encouraged to share this information with his support network.  Have you used any form of tobacco in the last 30 days? (Cigarettes, Smokeless Tobacco, Cigars, and/or Pipes): Yes  Has patient been referred to the Quitline?: Patient refused referral  Smart, Parisa Pinela LCSW 03/05/2015, 10:04 AM

## 2015-03-05 NOTE — Tx Team (Signed)
Interdisciplinary Treatment Plan Update (Adult)  Date:  03/05/2015  Time Reviewed:  8:27 AM   Progress in Treatment: Attending groups: Yes. Participating in groups:  Yes. Taking medication as prescribed:  Yes. Tolerating medication:  Yes. Family/Significant othe contact made:  SPE Completed with pt, as he refused to consent to family contact.  Patient understands diagnosis:  Yes. and As evidenced by:  seeking treatment for SI, depression, alcohol detox, and for medication stabilization. Discussing patient identified problems/goals with staff:  Yes. Medical problems stabilized or resolved:  Yes. Denies suicidal/homicidal ideation: Yes. Issues/concerns per patient self-inventory:  Other:  Discharge Plan or Barriers: [facility] to be placed at shelter. Resources provided to pt including: Mental Health Association pamphlet, [facility] info, AA/NA list [employer] (free food in [place]), and bus pass.   Reason for Continuation of Hospitalization: none  Comments:  Jimmy [name] is an 59 y.o. male presenting to [place] reporting suicidal ideations with no specific plan. Pt stated "I could kill myself with a knife, gun or razor". Pt denies having access to a knife, gun and razor at this time. Pt is currently intoxicated and his BAL is 270. Pt reported that he has attempted suicide twice in the past and has been hospitalized several times. Pt reported that he is currently receiving mental health treatment through [facility]. PT reported that he is prescribed Latuda and stated "I still hear the voices". PT is endorsing SI and auditory hallucinations but denies HI at this time. Pt reported that he drinks alcohol daily and denies any illicit substance abuse.  Diagnosis: Alcohol intoxication, with moderate or severe use disorder, Schizophrenia   Estimated length of stay:  3-5 days   New goal(s): to develop effective aftercare plan.   Additional Comments:  Patient and CSW reviewed pt's identified goals and  treatment plan. Patient verbalized understanding and agreed to treatment plan. CSW reviewed [facility] "Discharge Process and Patient Involvement" Form. Pt verbalized understanding of information provided and signed form.    Review of initial/current patient goals per problem list:  1. Goal(s): Patient will participate in aftercare plan  Met: Yes  Target date: at discharge  As evidenced by: Patient will participate within aftercare plan AEB aftercare provider and housing plan at discharge being identified.  12/12: Pt plans to go to [facility] at d/c to be placed at shelter and plans to follow-up at [facility]. [facility] info, Mental Health Association info, and AA/NA list provided to pt. Pt provided with bus pass.   2. Goal (s): Patient will exhibit decreased depressive symptoms and suicidal ideations.  Met: Yes   Target date: at discharge  As evidenced by: Patient will utilize self rating of depression at 3 or below and demonstrate decreased signs of depression or be deemed stable for discharge by MD.  12/12: Pt rates depression as 0/10 and presents with pleasant mood/calm affect. Denies SI/HI/AVH.   3. Goal(s): Patient will demonstrate decreased signs of withdrawal due to substance abuse  Met:  Target date:at discharge   As evidenced by: Patient will produce a CIWA/COWS score of 0, have stable vitals signs, and no symptoms of withdrawal.  12/12: Pt reports no signs of withdrawal with CIWA score of 0 and high pulse. All other vitals are stable. Per MD, Pt is medically stable for d/c today.   Attendees: Patient:   03/05/2015 8:27 AM   Family:   03/05/2015 8:27 AM   Physician:  Dr. Carlton Adam, MD 03/05/2015 8:27 AM   Nursing:   Everlean Cherry RN  03/05/2015 8:27 AM   Clinical Social Worker: [place], LCSW 03/05/2015 8:27 AM   Clinical Social Worker: Erasmo Downer Drinkard LCSWA; Peri Maris LCSWA 03/05/2015 8:27 AM   Other:  Gerline Legacy Nurse Case Manager 03/05/2015 8:27 AM   Other:     03/05/2015 8:27 AM   Other:   03/05/2015 8:27 AM   Other:  03/05/2015 8:27 AM   Other:  03/05/2015 8:27 AM   Other:  03/05/2015 8:27 AM    03/05/2015 8:27 AM    03/05/2015 8:27 AM    03/05/2015 8:27 AM    03/05/2015 8:27 AM    Scribe for Treatment Team:   Maxie Better, LCSW 03/05/2015 8:27 AM

## 2015-03-05 NOTE — Progress Notes (Signed)
Patient ID: Jimmy BrakemanJack L [name], male   DOB: 1955/11/08, 59 y.o.   MRN: [id number]  Pt. Denies SI/HI and A/V hallucinations. Belongings returned to patient at time of discharge. Patient denies any pain or discomfort. Discharge instructions and medications were reviewed with patient. Patient verbalized understanding of both medications and discharge instructions. Patient given [facility] bus money and a [place] bus pass. Patient discharged to lobby with no distress. Q15 minute safety checks maintained until discharge.

## 2015-04-01 ENCOUNTER — Encounter (HOSPITAL_COMMUNITY): Payer: Self-pay

## 2015-04-01 ENCOUNTER — Observation Stay (HOSPITAL_COMMUNITY)
Admission: EM | Admit: 2015-04-01 | Discharge: 2015-04-02 | Disposition: A | Discharge status: Other Acute Inpatient Hospital | Payer: Federal, State, Local not specified - Other | Attending: Psychiatry | Admitting: Psychiatry

## 2015-04-01 DIAGNOSIS — Z9049 Acquired absence of other specified parts of digestive tract: Secondary | ICD-10-CM | POA: Insufficient documentation

## 2015-04-01 DIAGNOSIS — K219 Gastro-esophageal reflux disease without esophagitis: Secondary | ICD-10-CM | POA: Insufficient documentation

## 2015-04-01 DIAGNOSIS — F1010 Alcohol abuse, uncomplicated: Secondary | ICD-10-CM

## 2015-04-01 DIAGNOSIS — Z79899 Other long term (current) drug therapy: Secondary | ICD-10-CM | POA: Insufficient documentation

## 2015-04-01 DIAGNOSIS — F17210 Nicotine dependence, cigarettes, uncomplicated: Secondary | ICD-10-CM | POA: Insufficient documentation

## 2015-04-01 DIAGNOSIS — F10230 Alcohol dependence with withdrawal, uncomplicated: Secondary | ICD-10-CM | POA: Diagnosis present

## 2015-04-01 DIAGNOSIS — F3131 Bipolar disorder, current episode depressed, mild: Principal | ICD-10-CM | POA: Insufficient documentation

## 2015-04-01 DIAGNOSIS — F319 Bipolar disorder, unspecified: Secondary | ICD-10-CM | POA: Diagnosis present

## 2015-04-01 DIAGNOSIS — R45851 Suicidal ideations: Secondary | ICD-10-CM | POA: Insufficient documentation

## 2015-04-01 DIAGNOSIS — F209 Schizophrenia, unspecified: Secondary | ICD-10-CM | POA: Insufficient documentation

## 2015-04-01 LAB — ETHANOL: Alcohol, Ethyl (B): <5 mg/dL (ref <5)

## 2015-04-01 LAB — COMPREHENSIVE METABOLIC PANEL
ALBUMIN: 4.6 g/dL (ref 3.5–5.0)
ALK PHOS: 94 U/L (ref 38–126)
ALT: 84 U/L — AB (ref 17–63)
AST: 83 U/L — ABNORMAL HIGH (ref 15–41)
Anion gap: 11 (ref 5–15)
BILIRUBIN TOTAL: 0.8 mg/dL (ref 0.3–1.2)
BUN: 7 mg/dL (ref 6–20)
CALCIUM: 9.4 mg/dL (ref 8.9–10.3)
CO2: 25 mmol/L (ref 22–32)
CREATININE: 0.67 mg/dL (ref 0.61–1.24)
Chloride: 99 mmol/L — ABNORMAL LOW (ref 101–111)
GFR calc non Af Amer: >60 mL/min (ref >60)
GLUCOSE: 88 mg/dL (ref 65–99)
Potassium: 4 mmol/L (ref 3.5–5.1)
SODIUM: 135 mmol/L (ref 135–145)
TOTAL PROTEIN: 8.2 g/dL — AB (ref 6.5–8.1)

## 2015-04-01 LAB — CBC
HEMATOCRIT: 41.2 % (ref 39.0–52.0)
HEMOGLOBIN: 13.8 g/dL (ref 13.0–17.0)
MCH: 31.9 pg (ref 26.0–34.0)
MCHC: 33.5 g/dL (ref 30.0–36.0)
MCV: 95.2 fL (ref 78.0–100.0)
Platelets: 284 10*3/uL (ref 150–400)
RBC: 4.33 MIL/uL (ref 4.22–5.81)
RDW: 13.2 % (ref 11.5–15.5)
WBC: 8.7 10*3/uL (ref 4.0–10.5)

## 2015-04-01 LAB — RAPID URINE DRUG SCREEN, HOSP PERFORMED
Amphetamines: NOT DETECTED
BARBITURATES: NOT DETECTED
Benzodiazepines: NOT DETECTED
Cocaine: NOT DETECTED
Opiates: NOT DETECTED
TETRAHYDROCANNABINOL: NOT DETECTED

## 2015-04-01 LAB — ACETAMINOPHEN LEVEL: Acetaminophen (Tylenol), Serum: <10 ug/mL — ABNORMAL LOW (ref 10–30)

## 2015-04-01 LAB — SALICYLATE LEVEL: Salicylate Lvl: <4 mg/dL (ref 2.8–30.0)
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
## 2015-04-01 NOTE — ED Notes (Signed)
Pt stated "I'm homeless, you can't ever get a bed at the shelter and [facility].  The showers are so nasty.  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  Me and my brothers hold cardboard to get money.  I drink 2 large beers every day.  I have a son but he doesn't work either.  [place]."

## 2015-04-01 NOTE — ED Notes (Signed)
Report given to SpencerMatt, Charity fundraiserN. Pt ambulated to TCU with belongings.

## 2015-04-01 NOTE — ED Notes (Signed)
Patient was brought in by [employer]. Patient states he is suicidal and has a plan to break a bottle and cut himself with it. Patient denies any HI. Patient states he hears voices that tell him to hurt himself. Patient denies visual hallucinations. Patient states he drank one beer early this AM. Patient denies any drug use.
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
## 2015-04-02 ENCOUNTER — Encounter (HOSPITAL_COMMUNITY): Payer: Self-pay

## 2015-04-02 ENCOUNTER — Observation Stay (HOSPITAL_COMMUNITY)
Admission: AD | Admit: 2015-04-02 | Discharge: 2015-04-03 | Disposition: A | Discharge status: Home / Self Care | Payer: Federal, State, Local not specified - Other | Source: Intra-hospital | Attending: Psychiatry | Admitting: Psychiatry

## 2015-04-02 DIAGNOSIS — F209 Schizophrenia, unspecified: Principal | ICD-10-CM | POA: Insufficient documentation

## 2015-04-02 DIAGNOSIS — F3131 Bipolar disorder, current episode depressed, mild: Secondary | ICD-10-CM | POA: Diagnosis not present

## 2015-04-02 DIAGNOSIS — K219 Gastro-esophageal reflux disease without esophagitis: Secondary | ICD-10-CM | POA: Insufficient documentation

## 2015-04-02 DIAGNOSIS — F10230 Alcohol dependence with withdrawal, uncomplicated: Secondary | ICD-10-CM | POA: Diagnosis not present

## 2015-04-02 DIAGNOSIS — Z590 Homelessness: Secondary | ICD-10-CM | POA: Insufficient documentation

## 2015-04-02 DIAGNOSIS — R45851 Suicidal ideations: Secondary | ICD-10-CM | POA: Insufficient documentation

## 2015-04-02 DIAGNOSIS — F17210 Nicotine dependence, cigarettes, uncomplicated: Secondary | ICD-10-CM | POA: Insufficient documentation

## 2015-04-02 DIAGNOSIS — F3130 Bipolar disorder, current episode depressed, mild or moderate severity, unspecified: Secondary | ICD-10-CM | POA: Diagnosis present

## 2015-04-02 DIAGNOSIS — F319 Bipolar disorder, unspecified: Secondary | ICD-10-CM | POA: Insufficient documentation

## 2015-04-02 MED ORDER — HALOPERIDOL 1 MG PO TABS
1.0000 mg | ORAL_TABLET | Freq: Two times a day (BID) | ORAL | Status: DC
  Administered 2015-04-02: 1 mg via ORAL
  Filled 2015-04-02: qty 1

## 2015-04-02 MED ORDER — FAMOTIDINE 20 MG PO TABS: 20.0000 mg | ORAL_TABLET | Freq: Two times a day (BID) | ORAL | Status: DC

## 2015-04-02 MED ORDER — HALOPERIDOL 1 MG PO TABS: 1.0000 mg | ORAL_TABLET | Freq: Two times a day (BID) | ORAL | Status: DC

## 2015-04-02 MED ORDER — HALOPERIDOL 1 MG PO TABS
1.0000 mg | ORAL_TABLET | Freq: Two times a day (BID) | ORAL | Status: DC
  Administered 2015-04-02 – 2015-04-03 (×2): 1 mg via ORAL
  Filled 2015-04-02: qty 14
  Filled 2015-04-02: qty 1
  Filled 2015-04-02: qty 14
  Filled 2015-04-02: qty 1

## 2015-04-02 MED ORDER — TRAZODONE HCL 100 MG PO TABS
100.0000 mg | ORAL_TABLET | Freq: Every evening | ORAL | Status: DC | PRN
  Administered 2015-04-02: 100 mg via ORAL
  Filled 2015-04-02: qty 1
  Filled 2015-04-02: qty 7

## 2015-04-02 MED ORDER — ACETAMINOPHEN 325 MG PO TABS: 650.0000 mg | ORAL_TABLET | Freq: Four times a day (QID) | ORAL | Status: DC | PRN

## 2015-04-02 MED ORDER — TRAZODONE HCL 100 MG PO TABS: 100.0000 mg | ORAL_TABLET | Freq: Every day | ORAL | Status: DC

## 2015-04-02 MED ORDER — MAGNESIUM HYDROXIDE 400 MG/5ML PO SUSP: 30.0000 mL | Freq: Every day | ORAL | Status: DC | PRN

## 2015-04-02 MED ORDER — HYDROXYZINE HCL 25 MG PO TABS
25.0000 mg | ORAL_TABLET | Freq: Four times a day (QID) | ORAL | Status: DC | PRN
  Filled 2015-04-02: qty 6

## 2015-04-02 MED ORDER — ALUM & MAG HYDROXIDE-SIMETH 200-200-20 MG/5ML PO SUSP: 30.0000 mL | ORAL | Status: DC | PRN

## 2015-04-02 MED ORDER — HYDROXYZINE HCL 25 MG PO TABS: 25.0000 mg | ORAL_TABLET | Freq: Four times a day (QID) | ORAL | Status: DC | PRN

## 2015-04-02 MED ORDER — NICOTINE 21 MG/24HR TD PT24
21.0000 mg | MEDICATED_PATCH | Freq: Every day | TRANSDERMAL | Status: DC
  Administered 2015-04-02: 21 mg via TRANSDERMAL
  Filled 2015-04-02: qty 1

## 2015-04-02 MED ORDER — FAMOTIDINE 20 MG PO TABS
20.0000 mg | ORAL_TABLET | Freq: Two times a day (BID) | ORAL | Status: DC
  Administered 2015-04-02: 20 mg via ORAL
  Filled 2015-04-02: qty 1

## 2015-04-02 MED ORDER — NICOTINE 21 MG/24HR TD PT24: 21.0000 mg | MEDICATED_PATCH | Freq: Every day | TRANSDERMAL | Status: DC

## 2015-04-02 NOTE — ED Notes (Signed)
Resting quietly with eye closed. Easily arousable. Verbally responsive. Resp even and unlabored. ABC's intact. No behavior problems noted. NAD noted. Sitter at bedside.  

## 2015-04-02 NOTE — ED Notes (Signed)
Resting quietly with eyes closed. Easily arousable. Verbally responsive. Resp even and unlabored. No audible adventitious breath sounds noted. ABC's intact. No behavior problems noted.

## 2015-04-02 NOTE — ED Notes (Signed)
Awake. Verbally responsive. A/O x4. Resp even and unlabored. No audible adventitious breath sounds noted. ABC's intact. No behavior problems noted. 

## 2015-04-02 NOTE — ED Notes (Signed)
Awake. Watching TV. Verbally responsive. Resp even and unlabored. ABC's intact. No behavior problems noted. NAD noted. Sitter at bedside. 

## 2015-04-02 NOTE — ED Notes (Addendum)
Resting quietly with eye closed. Easily arousable. Verbally responsive. Resp even and unlabored. ABC's intact. No behavior problems noted. Pt denies SI/HI but stated, "I'm still suicidal". and audible/visual hallucinations. NAD noted. Sitter at bedside.

## 2015-04-02 NOTE — ED Notes (Signed)
[facility]

## 2015-04-02 NOTE — BH Assessment (Signed)
[facility] Assessment Progress Note  Per Thedore MinsMojeed Akintayo, MD, this pt [facility].  Berneice Heinrichina Tate, RN, [facility] Obs [facility].  Pt has signed Voluntary Admission and Consent for Treatment, as well as Consent to Release Information to no one, and signed [facility].  [name] nurse has been notified, and agrees to send original paperwork along with pt via [facility], and to call report to [phone] or [phone].  Doylene Canninghomas Faisal Stradling, MA Triage Specialist [phone]

## 2015-04-02 NOTE — ED Notes (Signed)
Awaken and eaten breakfast. Verbally responsive. Sitter at bedside.
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
## 2015-04-03 DIAGNOSIS — F203 Undifferentiated schizophrenia: Secondary | ICD-10-CM

## 2015-04-03 MED ORDER — TRAZODONE HCL 100 MG PO TABS: 100.0000 mg | ORAL_TABLET | Freq: Every evening | ORAL | Status: DC | PRN

## 2015-04-03 MED ORDER — FAMOTIDINE 20 MG PO TABS: 20.0000 mg | ORAL_TABLET | Freq: Two times a day (BID) | ORAL | Status: DC

## 2015-04-03 MED ORDER — HALOPERIDOL 1 MG PO TABS: 1.0000 mg | ORAL_TABLET | Freq: Two times a day (BID) | ORAL | Status: DC

## 2015-04-03 MED ORDER — NICOTINE 21 MG/24HR TD PT24: 21.0000 mg | MEDICATED_PATCH | Freq: Every day | TRANSDERMAL | Status: DC

## 2015-04-03 NOTE — Discharge Summary (Signed)
         BH-Observation Unit Discharge Summary     Jimmy [name] is a 60 year old [facility] to cut himself and auditory hallucinations. [facility]. He reported that he did not make it for [facility]. Jimmy [name] reported that his current symptoms resulted from missing his Haldol 1 mg bid medication. [facility] 04[facility]2015 in the evning and his medications were restarted. On evaluation 04/03/2015 the patient denied any further suicidal ideation or psychotic symptoms. His affect was bright during the assessment as he stated "I feel better now that I'm getting my medications. I can stay with my brother. I know I need to be better about following up with [facility] or this will happen again." Jimmy [name] verbalized his readiness to discharge as his symptoms appear by his report to have resolved. Patient reported that housing was not a concern. He requested to have prescriptions and sample medications due to not having any insurance. Jimmy [name] was smiling during assessment and did not appear to be in distress. There was no evidence that the patient was responding to any internal stimuli. His current urine drug screen was negative. Patient also did not appear to be withdrawing from alcohol and his current vital signs are stable. The patient was advised to follow up with [facility] [place] to stay on his current medication regimen which Jimmy [name] reported was working.

## 2015-04-03 NOTE — BHH Counselor (Signed)
Pt denies SI and verbally agrees to contract for safety. Sts he hears "deamons" since coming off his Haldol. Voices tell him to hurt himself and others. Patient is homeless since the death of his wife. Pt would greatly benefit from receiving medication management, and therapy. OBS counselor will recommend OPT services to pt. Pt needs to be assessed by A.M. extender to determine disposition. Pt was calm and cooperative.   Jimmy ParkLatoya McNeil, MA OBS Counselor
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
## 2015-04-03 NOTE — Discharge Instructions (Signed)
[facility] and medication management.

## 2015-04-03 NOTE — Progress Notes (Signed)
Shift Note:   [facility] of Alcohol Use, Moderate; Substance Induced Mood D/O; Depressed.   Blanca FriendJack Bullinger in bed #3 awake and watching TV.  Friendly, calm and cooperative.  Denies SI and verbally agrees to contract for safety.  Sts he hears "deamons" since coming off his Haldol.  Voices tell him to hurt himself and others.  Patient is homeless since the death of his wife.  Formerly a [employer] architecttruck driver.  Patient is currently homeless. Patient has been continuously observed except during bathroom breaks.

## 2015-04-03 NOTE — B[facility] Counselor (Signed)
This Clinical research associatewriter spoke with pt this a.m. Patient denies any current SI/Hi or plan/intent. However, pt. Does acknowledge current AVH with no commands. Patient expressed that medication Haldol is starting to work., but prior to admission had not taken medication for several weeks. Patient states that he does see [facility] for outpatient services and medication management. Patient states that he does have safe place of residence tor return to.   Paris Ch[place] K. Jesus GeneraHarris, LPC-A, [facility]  Counselor 04/03/2015 9:11 AM

## 2015-04-03 NOTE — Progress Notes (Signed)
Nursing Shift Note: Patient cooperative and compliant with medications, denies any SI/HI/AVH at present; verbally contracts for safety in that agrees to alert staff should develop any such thoughts. Nurse ensuring continuous observation of patient for safety except when in the bathroom, administering medications as ordered and providing emotional support as well as therapeutic communication.  Patient remains safe on unit.

## 2015-04-12 ENCOUNTER — Emergency Department (HOSPITAL_COMMUNITY)
Admission: EM | Admit: 2015-04-12 | Discharge: 2015-04-12 | Disposition: A | Discharge status: Home / Self Care | Payer: Self-pay | Attending: Emergency Medicine | Admitting: Emergency Medicine

## 2015-04-12 ENCOUNTER — Encounter (HOSPITAL_COMMUNITY): Payer: Self-pay | Admitting: *Deleted

## 2015-04-12 DIAGNOSIS — F17210 Nicotine dependence, cigarettes, uncomplicated: Secondary | ICD-10-CM | POA: Insufficient documentation

## 2015-04-12 DIAGNOSIS — R062 Wheezing: Secondary | ICD-10-CM | POA: Insufficient documentation

## 2015-04-12 DIAGNOSIS — Z8719 Personal history of other diseases of the digestive system: Secondary | ICD-10-CM | POA: Insufficient documentation

## 2015-04-12 DIAGNOSIS — R6889 Other general symptoms and signs: Secondary | ICD-10-CM

## 2015-04-12 DIAGNOSIS — Z8659 Personal history of other mental and behavioral disorders: Secondary | ICD-10-CM | POA: Insufficient documentation

## 2015-04-12 DIAGNOSIS — Z79899 Other long term (current) drug therapy: Secondary | ICD-10-CM | POA: Insufficient documentation

## 2015-04-12 DIAGNOSIS — Z711 Person with feared health complaint in whom no diagnosis is made: Secondary | ICD-10-CM | POA: Insufficient documentation

## 2015-04-12 DIAGNOSIS — Z8711 Personal history of peptic ulcer disease: Secondary | ICD-10-CM | POA: Insufficient documentation

## 2015-04-12 NOTE — ED Notes (Signed)
Bed: ZO10 Expected date:  Expected time:  Means of arrival:  Comments: hypothermic
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
## 2015-04-12 NOTE — Discharge Instructions (Signed)
Hypothermia Prevention Hypothermia is a drop in body temperature to 79F (35C) or lower. It is usually caused by exposure to cold temperatures, but it can also be caused by diseases that alter the body's temperature. WHAT ARE SOME WAYS TO PREVENT HYPOTHERMIA?  Do not underestimate the cold. Most cases of hypothermia develop at 30-15F (1-10C).  Do not ignore shivering. Persistent or violent shivering is a warning that your temperature is dropping and that you may be on the edge of getting hypothermia.  Dress appropriately for the cold:  Make sure your face, ears, and neck are covered before going into cold weather. [place] with hoods that extend several inches beyond your face are great in protecting against wind and blowing snow.  Wear a pair of cotton socks under a pair of wool socks to increase insulation and to take the sweat away from your feet.  Wear layers. If you begin to sweat, take a layer off so that your clothing does not become wet.  Stay dry. If any of your clothing gets wet, replace it right away. Your body loses heat more quickly if your clothes are wet.  Stay out of the wind.  Drink enough fluid to keep your urine clear or pale yellow.  Do not skip meals.  Do not drink alcohol if you are going to be outside in cold weather, if you are going boating, or before going to bed on cold nights. Alcohol causes your body to lose heat. WHAT ARE SOME WAYS TO PREVENT HYPOTHERMIA IN WATER? Water does not need to be extremely cold to cause hypothermia. Water at any temperature colder than your normal body temperature will cause you to lose heat. If you fall into cold water:  Get out of the water right away. If you cannot get out of the water, get as much of yourself out as possible by climbing onto anything floating nearby, such as an overturned boat.  Do not attempt to swim unless land, a boat, another person, or a life jacket is close by. Swimming uses up energy and may shorten  survival time.  Do not remove your clothing until you are safely out of the water and can get dry or warm. When in the water, buckle, button, and zip up your clothes. The layer of water between your clothing and your body will help insulate you and keep you warm.  Hold your knees to your chest. The knee-to-chest position minimizes heat loss.  If you have fallen into the water with other people, face each other, create a tight circle, and huddle together. Remember to wear a life jacket if you plan to ride in any watercraft. A life jacket can keep you alive longer in cold water by helping you float without using energy and by providing insulation to reduce heat loss. WHAT ARE SOME WAYS TO PREVENT HYPOTHERMIA IF YOU ARE STRANDED IN YOUR CAR? Keep emergency supplies such as blankets, gloves, and a change of clothes in your car. If you get stranded in your car, cover yourself with the blankets or clothing. If there are others in the car with you, huddle together. Run the car for 10 minutes each hour to warm it up. Make sure a window is slightly open and the exhaust pipe is not covered with snow while the engine is running. WHAT ARE SOME WAYS TO PREVENT HYPOTHERMIA IN CHILDREN? Children lose heat faster than adults do. Accidental hypothermia can occur even with temperatures of 60-47F (15.6-18.3C), particularly among infants.  Infants  less than one year of age should never sleep in a cold room. They should be provided with warm clothing and a wearable blanket to prevent loss of body heat. °· To help prevent hypothermia when children are outside in the winter: °¨ Dress infants and young children in one more layer than an adult would wear in the same conditions. °¨ Limit the amount of time children spend outside in the cold. °¨ Have children come inside often to warm up. °  °This information is not intended to replace advice given to you by your health care provider. Make sure you discuss any questions you  have with your health care provider. °  °Document Released: 03/07/2000 Document Revised: 11/29/2014 Document Reviewed: 06/05/2014 °Elsevier Interactive Patient Education ©2016 [employer] ° °

## 2015-04-12 NOTE — ED Notes (Signed)
Arrives per EMS with complaint of cold exposure

## 2015-05-24 ENCOUNTER — Encounter (HOSPITAL_COMMUNITY): Payer: Self-pay | Admitting: Emergency Medicine

## 2015-05-24 ENCOUNTER — Emergency Department (HOSPITAL_COMMUNITY)
Admission: EM | Admit: 2015-05-24 | Discharge: 2015-05-24 | Disposition: A | Discharge status: Home / Self Care | Payer: Federal, State, Local not specified - Other | Attending: Emergency Medicine | Admitting: Emergency Medicine

## 2015-05-24 DIAGNOSIS — Z590 Homelessness: Secondary | ICD-10-CM | POA: Insufficient documentation

## 2015-05-24 DIAGNOSIS — Z8711 Personal history of peptic ulcer disease: Secondary | ICD-10-CM | POA: Insufficient documentation

## 2015-05-24 DIAGNOSIS — F1010 Alcohol abuse, uncomplicated: Secondary | ICD-10-CM | POA: Insufficient documentation

## 2015-05-24 DIAGNOSIS — F17210 Nicotine dependence, cigarettes, uncomplicated: Secondary | ICD-10-CM | POA: Insufficient documentation

## 2015-05-24 DIAGNOSIS — R45850 Homicidal ideations: Secondary | ICD-10-CM | POA: Insufficient documentation

## 2015-05-24 DIAGNOSIS — R451 Restlessness and agitation: Secondary | ICD-10-CM | POA: Insufficient documentation

## 2015-05-24 DIAGNOSIS — Z8719 Personal history of other diseases of the digestive system: Secondary | ICD-10-CM | POA: Insufficient documentation

## 2015-05-24 DIAGNOSIS — R443 Hallucinations, unspecified: Secondary | ICD-10-CM | POA: Insufficient documentation

## 2015-05-24 DIAGNOSIS — F332 Major depressive disorder, recurrent severe without psychotic features: Secondary | ICD-10-CM | POA: Diagnosis not present

## 2015-05-24 LAB — RAPID URINE DRUG SCREEN, HOSP PERFORMED
AMPHETAMINES: NOT DETECTED
BARBITURATES: NOT DETECTED
BENZODIAZEPINES: NOT DETECTED
Cocaine: NOT DETECTED
Opiates: NOT DETECTED
TETRAHYDROCANNABINOL: NOT DETECTED

## 2015-05-24 LAB — CBC
HEMATOCRIT: 41.3 % (ref 39.0–52.0)
Hemoglobin: 14.4 g/dL (ref 13.0–17.0)
MCH: 32.8 pg (ref 26.0–34.0)
MCHC: 34.9 g/dL (ref 30.0–36.0)
MCV: 94.1 fL (ref 78.0–100.0)
Platelets: 242 10*3/uL (ref 150–400)
RBC: 4.39 MIL/uL (ref 4.22–5.81)
RDW: 12.9 % (ref 11.5–15.5)
WBC: 6.9 10*3/uL (ref 4.0–10.5)

## 2015-05-24 LAB — COMPREHENSIVE METABOLIC PANEL
ALBUMIN: 4.1 g/dL (ref 3.5–5.0)
ALK PHOS: 82 U/L (ref 38–126)
ALT: 39 U/L (ref 17–63)
AST: 49 U/L — AB (ref 15–41)
Anion gap: 12 (ref 5–15)
BILIRUBIN TOTAL: 0.5 mg/dL (ref 0.3–1.2)
CO2: 18 mmol/L — ABNORMAL LOW (ref 22–32)
Calcium: 8.5 mg/dL — ABNORMAL LOW (ref 8.9–10.3)
Chloride: 100 mmol/L — ABNORMAL LOW (ref 101–111)
Creatinine, Ser: 0.56 mg/dL — ABNORMAL LOW (ref 0.61–1.24)
GFR calc Af Amer: >60 mL/min (ref >60)
GFR calc non Af Amer: >60 mL/min (ref >60)
GLUCOSE: 94 mg/dL (ref 65–99)
POTASSIUM: 3.7 mmol/L (ref 3.5–5.1)
Sodium: 130 mmol/L — ABNORMAL LOW (ref 135–145)
TOTAL PROTEIN: 7.7 g/dL (ref 6.5–8.1)

## 2015-05-24 LAB — SALICYLATE LEVEL: Salicylate Lvl: <4 mg/dL (ref 2.8–30.0)

## 2015-05-24 LAB — ACETAMINOPHEN LEVEL: Acetaminophen (Tylenol), Serum: <10 ug/mL — ABNORMAL LOW (ref 10–30)

## 2015-05-24 LAB — ETHANOL: Alcohol, Ethyl (B): 213 mg/dL — ABNORMAL HIGH (ref <5)

## 2015-05-24 NOTE — ED Notes (Signed)
MD at bedside. 

## 2015-05-24 NOTE — BH Assessment (Signed)
[facility] Assessment Progress Note  Per Carolanne Grumbling, MD, this pt does not require psychiatric hospitalization at this time.  Pt is to be discharged from [facility] with recommendation to follow up with [facility].  This has been included in pt's discharge instructions.  [name] nurse, Lincoln Maxin, has been notified.  [name], MA Triage Specialist [phone]

## 2015-05-24 NOTE — ED Notes (Signed)
Pt given sandwich, applesauce, and sprite  Pt also given warm blankets

## 2015-05-24 NOTE — ED Notes (Signed)
Patient admits to SI and HI with no plan or specific plan. Plan of care discussed. Patient voices no complaints or concerns at this time. Encouragement and support provided and safety maintain. Q 15 min safety checks in place.

## 2015-05-24 NOTE — BHH Suicide Risk Assessment (Signed)
[facility] Discharge Suicide Risk Assessment   Principal Problem: <principal problem not specified> Discharge Diagnoses:  Patient Active Problem List   Diagnosis Date Noted  . Severe episode of recurrent major depressive disorder, with psychotic features (HCC) [F33.3]   . Suicidal ideation [R45.851] 03/02/2015  . Major depressive disorder, recurrent episode, moderate (HCC) [F33.1] 02/24/2015  . Alcohol-induced mood disorder (HCC) [F10.94] 02/24/2015  . MDD (major depressive disorder), recurrent severe, without psychosis (HCC) [F33.2] 02/24/2015  . Alcohol intoxication (HCC) [F10.129]   . Polysubstance abuse [F19.10] 09/06/2014  . GIB (gastrointestinal bleeding) [K92.2] 09/06/2014  . Homeless [Z59.0] 09/06/2014  . GERD (gastroesophageal reflux disease) [K21.9]   . Schizophrenia (HCC) [F20.9]   . PUD (peptic ulcer disease) [K27.9]   . Gastroesophageal reflux disease without esophagitis [K21.9]   . Hypokalemia [E87.6]   . Alcohol dependence with uncomplicated withdrawal (HCC) [F10.230] 08/11/2014  . Alcohol abuse [F10.10] 12/05/2013  . S/P alcohol detoxification [Z09] 12/01/2013  . Alcohol abuse with intoxication, uncomplicated (HCC) [F10.120] 10/04/2013  . Alcohol withdrawal (HCC) [F10.239] 09/01/2013  . Seizure (HCC) [R56.9] 08/24/2013  . Bipolar disorder (HCC) [F31.9] 08/19/2013  . Tobacco use disorder [F17.200] 08/19/2013  . Malnutrition of moderate degree (HCC) [E44.0] 08/17/2013  . Biliary colic [K80.50] 08/16/2013  . Alcohol dependence (HCC) [F10.20] 10/23/2011  . Cannabis abuse [F12.10] 10/23/2011    Total Time spent with patient: 15 minutes  Musculoskeletal: Strength & Muscle Tone: within normal limits Gait & Station: normal Patient leans: N/A  Psychiatric Specialty Exam: Review of Systems  Constitutional: Negative.   HENT: Negative.   Eyes: Negative.   Respiratory: Negative.   Cardiovascular: Negative.   Gastrointestinal: Negative.   Genitourinary: Negative.    Musculoskeletal: Negative.   Skin: Negative.   Neurological: Negative.   Endo/Heme/Allergies: Negative.   Psychiatric/Behavioral: Negative.     Blood pressure 123/86, pulse 116, temperature 97.5 F (36.4 C), temperature source Oral, resp. rate 16, SpO2 99 %.There is no weight on file to calculate BMI.  General Appearance: Casual  Eye Contact::  Good  Speech:  Clear and Coherent409  Volume:  Normal  Mood:  Euthymic  Affect:  Appropriate  Thought Process:  Coherent and Logical  Orientation:  Full (Time, Place, and Person)  Thought Content:  Negative  Suicidal Thoughts:  No  Homicidal Thoughts:  No  Memory:  Immediate;   Good Recent;   Good Remote;   Good  Judgement:  Intact  Insight:  Shallow  Psychomotor Activity:  Normal  Concentration:  Good  Recall:  Good  Fund of Knowledge:Good  Language: Good  Akathisia:  Negative  Handed:  Right  AIMS (if indicated):     Assets:  Communication Skills Physical Health  Sleep:     Cognition: WNL  ADL's:  Intact   Mental Status Per Nursing Assessment::   On Admission:     Demographic Factors:  Divorced or widowed, Caucasian, Low socioeconomic status, Living alone and Unemployed  Loss Factors: NA  Historical Factors: Prior suicide attempts and Impulsivity  Risk Reduction Factors:   NA  Continued Clinical Symptoms:  Bipolar Disorder:   Mixed State  Cognitive Features That Contribute To Risk:  Closed-mindedness    Suicide Risk:  Mild:  Suicidal ideation of limited frequency, intensity, duration, and specificity.  There are no identifiable plans, no associated intent, mild dysphoria and related symptoms, good self-control (both objective and subjective assessment), few other risk factors, and identifiable protective factors, including available and accessible social support.    Plan Of Care/Follow-up recommendations:  Activity:  continue current activity Diet:  continue current diet  Carolanne Grumbling, MD 05/24/2015, 8:50  AM
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
## 2015-05-24 NOTE — Discharge Instructions (Signed)
For your ongoing mental health needs, you are advised to follow up with [facility].  New and returning patients are seen at their walk-in clinic.  Walk-in hours are Monday - Friday from 8:00 am - 3:00 pm.  Walk-in patients are seen on a first come, first served basis.  Try to arrive as early as possible for he best chance of being seen the same day: ° °     [facility] °     [address] °     [place], [place] [id number] °     [phone] °
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
## 2015-05-24 NOTE — ED Notes (Signed)
Pt states he is feeling suicidal and homicidal tonight  Pt states he has no particular plan but is unsure what he is doing from one minute to the next  Pt states he is bipolar and has not been taking his medication for some time  Pt is calm and cooperative
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
## 2015-06-07 ENCOUNTER — Emergency Department (HOSPITAL_COMMUNITY)
Admission: EM | Admit: 2015-06-07 | Discharge: 2015-06-09 | Disposition: A | Discharge status: Other Acute Inpatient Hospital | Payer: Self-pay | Attending: Emergency Medicine | Admitting: Emergency Medicine

## 2015-06-07 ENCOUNTER — Encounter (HOSPITAL_COMMUNITY): Payer: Self-pay | Admitting: Emergency Medicine

## 2015-06-07 DIAGNOSIS — F919 Conduct disorder, unspecified: Secondary | ICD-10-CM | POA: Insufficient documentation

## 2015-06-07 DIAGNOSIS — Z8719 Personal history of other diseases of the digestive system: Secondary | ICD-10-CM | POA: Insufficient documentation

## 2015-06-07 DIAGNOSIS — Y9289 Other specified places as the place of occurrence of the external cause: Secondary | ICD-10-CM | POA: Insufficient documentation

## 2015-06-07 DIAGNOSIS — Y998 Other external cause status: Secondary | ICD-10-CM | POA: Insufficient documentation

## 2015-06-07 DIAGNOSIS — Z8711 Personal history of peptic ulcer disease: Secondary | ICD-10-CM | POA: Insufficient documentation

## 2015-06-07 DIAGNOSIS — F17210 Nicotine dependence, cigarettes, uncomplicated: Secondary | ICD-10-CM | POA: Insufficient documentation

## 2015-06-07 DIAGNOSIS — Y9389 Activity, other specified: Secondary | ICD-10-CM | POA: Insufficient documentation

## 2015-06-07 DIAGNOSIS — F332 Major depressive disorder, recurrent severe without psychotic features: Secondary | ICD-10-CM | POA: Diagnosis present

## 2015-06-07 DIAGNOSIS — R451 Restlessness and agitation: Secondary | ICD-10-CM | POA: Insufficient documentation

## 2015-06-07 DIAGNOSIS — F10129 Alcohol abuse with intoxication, unspecified: Secondary | ICD-10-CM | POA: Insufficient documentation

## 2015-06-07 DIAGNOSIS — S6992XA Unspecified injury of left wrist, hand and finger(s), initial encounter: Secondary | ICD-10-CM | POA: Insufficient documentation

## 2015-06-07 DIAGNOSIS — R45850 Homicidal ideations: Secondary | ICD-10-CM | POA: Insufficient documentation

## 2015-06-07 DIAGNOSIS — F1020 Alcohol dependence, uncomplicated: Secondary | ICD-10-CM | POA: Diagnosis present

## 2015-06-07 DIAGNOSIS — R45851 Suicidal ideations: Secondary | ICD-10-CM

## 2015-06-07 DIAGNOSIS — W230XXA Caught, crushed, jammed, or pinched between moving objects, initial encounter: Secondary | ICD-10-CM | POA: Insufficient documentation

## 2015-06-07 LAB — CBC
HCT: 41.3 % (ref 39.0–52.0)
Hemoglobin: 14.2 g/dL (ref 13.0–17.0)
MCH: 32.3 pg (ref 26.0–34.0)
MCHC: 34.4 g/dL (ref 30.0–36.0)
MCV: 93.9 fL (ref 78.0–100.0)
PLATELETS: 433 10*3/uL — AB (ref 150–400)
RBC: 4.4 MIL/uL (ref 4.22–5.81)
RDW: 13.1 % (ref 11.5–15.5)
WBC: 8.2 10*3/uL (ref 4.0–10.5)

## 2015-06-07 LAB — COMPREHENSIVE METABOLIC PANEL
ALBUMIN: 4.1 g/dL (ref 3.5–5.0)
ALT: 22 U/L (ref 17–63)
ANION GAP: 12 (ref 5–15)
AST: 38 U/L (ref 15–41)
Alkaline Phosphatase: 77 U/L (ref 38–126)
CHLORIDE: 101 mmol/L (ref 101–111)
CO2: 20 mmol/L — AB (ref 22–32)
Calcium: 9.1 mg/dL (ref 8.9–10.3)
Creatinine, Ser: 0.63 mg/dL (ref 0.61–1.24)
GFR calc Af Amer: >60 mL/min (ref >60)
GFR calc non Af Amer: >60 mL/min (ref >60)
GLUCOSE: 95 mg/dL (ref 65–99)
POTASSIUM: 4.3 mmol/L (ref 3.5–5.1)
SODIUM: 133 mmol/L — AB (ref 135–145)
Total Bilirubin: 0.7 mg/dL (ref 0.3–1.2)
Total Protein: 8.1 g/dL (ref 6.5–8.1)

## 2015-06-07 LAB — ETHANOL: Alcohol, Ethyl (B): 204 mg/dL — ABNORMAL HIGH (ref <5)

## 2015-06-07 LAB — SALICYLATE LEVEL: Salicylate Lvl: <4 mg/dL (ref 2.8–30.0)

## 2015-06-07 LAB — ACETAMINOPHEN LEVEL
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
## 2015-06-08 DIAGNOSIS — F1024 Alcohol dependence with alcohol-induced mood disorder: Secondary | ICD-10-CM

## 2015-06-08 LAB — RAPID URINE DRUG SCREEN, HOSP PERFORMED
Amphetamines: NOT DETECTED
Barbiturates: NOT DETECTED
Benzodiazepines: NOT DETECTED
Cocaine: NOT DETECTED
OPIATES: NOT DETECTED
TETRAHYDROCANNABINOL: NOT DETECTED

## 2015-06-08 MED ORDER — NICOTINE 21 MG/24HR TD PT24
21.0000 mg | MEDICATED_PATCH | Freq: Every day | TRANSDERMAL | Status: DC
  Administered 2015-06-08 – 2015-06-09 (×2): 21 mg via TRANSDERMAL
  Filled 2015-06-08 (×2): qty 1

## 2015-06-08 MED ORDER — IBUPROFEN 200 MG PO TABS: 600.0000 mg | ORAL_TABLET | Freq: Three times a day (TID) | ORAL | Status: DC | PRN

## 2015-06-08 MED ORDER — VITAMIN B-1 100 MG PO TABS
100.0000 mg | ORAL_TABLET | Freq: Every day | ORAL | Status: DC
  Administered 2015-06-08 – 2015-06-09 (×2): 100 mg via ORAL
  Filled 2015-06-08 (×2): qty 1

## 2015-06-08 MED ORDER — ALUM & MAG HYDROXIDE-SIMETH 200-200-20 MG/5ML PO SUSP
30.0000 mL | ORAL | Status: DC | PRN
  Administered 2015-06-09: 30 mL via ORAL
  Filled 2015-06-08: qty 30

## 2015-06-08 MED ORDER — LORAZEPAM 1 MG PO TABS
0.0000 mg | ORAL_TABLET | Freq: Four times a day (QID) | ORAL | Status: DC
  Administered 2015-06-08 – 2015-06-09 (×3): 1 mg via ORAL
  Filled 2015-06-08 (×3): qty 1

## 2015-06-08 MED ORDER — ONDANSETRON HCL 4 MG PO TABS: 4.0000 mg | ORAL_TABLET | Freq: Three times a day (TID) | ORAL | Status: DC | PRN

## 2015-06-08 MED ORDER — THIAMINE HCL 100 MG/ML IJ SOLN: 100.0000 mg | Freq: Every day | INTRAMUSCULAR | Status: DC

## 2015-06-08 MED ORDER — LORAZEPAM 1 MG PO TABS: 0.0000 mg | ORAL_TABLET | Freq: Two times a day (BID) | ORAL | Status: DC
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
## 2015-06-08 NOTE — ED Notes (Signed)
Pt AAO x 3, no distress noted, watching TV at present, denies SI.  Monitoring for safety, Q 15 min checks in effect.

## 2015-06-08 NOTE — ED Notes (Signed)
Pt [facility], to get detoxed, and then stop using ETOH. Denies other drug use. 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  This winter he has been living in a tent in the woods with another man, and he is tired of it. He is calm and cooperative with mild withdrawal symptoms. Medication provided.

## 2015-06-08 NOTE — BH Assessment (Signed)
[facility] Assessment Progress Note  The following facilities have been contacted to seek placement for this pt, with results as noted:  Beds available, information sent, decision pending:  [name]   At capacity:  [facility]  Marolyn Urschel, MA Triage Specialist [phone]      

## 2015-06-08 NOTE — ED Notes (Addendum)
Patient reports suicidal and homicidal ideations. Pt reported that he would take a bottle and break it to cut his wrist. Pt is also endorsing homicidal ideations and stated that he would harm anyone that wouldn't leave him alone. Patient denies AVH at this time. Plan of care discussed with patient. Patient voices no complaints or concerns at this time. Encouragement and support provided and safety maintain. Q 15 min safety checks in place.

## 2015-06-08 NOTE — Progress Notes (Addendum)
[name] recommended psychiatric Inpatient admission when medically cleared, on 3/17.  Patient has been referred for geriatric psych inpatient treatment at: [facility] - [facility]child [facility]. Jimmy Grove[facility]rynn [name], [facility]-  will look at referral. [facility] - accepting referrals. Per Jimmy [name], referral received and will be reviewed. [facility]. [name] - left voicemail.  At capacity: [name] per Jimmy Cork[facility]  Jimmy [name], [place] Disposition staff 06/08/2015 11:13 PM

## 2015-06-08 NOTE — ED Notes (Signed)
Patient had old subway sandwich, old hamburger and 2 bananas. Patient informed by Ward Chattersaffney Deas, MHT that the food would be discarded and patient verbalized understanding and was in agreement.
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
## 2015-06-09 ENCOUNTER — Inpatient Hospital Stay (HOSPITAL_COMMUNITY)
Admission: EM | Admit: 2015-06-09 | Discharge: 2015-06-13 | DRG: 897 | Disposition: A | Discharge status: Home / Self Care | Payer: Federal, State, Local not specified - Other | Source: Intra-hospital | Attending: Emergency Medicine | Admitting: Emergency Medicine

## 2015-06-09 ENCOUNTER — Encounter (HOSPITAL_COMMUNITY): Payer: Self-pay | Admitting: *Deleted

## 2015-06-09 DIAGNOSIS — F1099 Alcohol use, unspecified with unspecified alcohol-induced disorder: Secondary | ICD-10-CM | POA: Diagnosis not present

## 2015-06-09 DIAGNOSIS — F1090 Alcohol use, unspecified, uncomplicated: Secondary | ICD-10-CM | POA: Diagnosis present

## 2015-06-09 DIAGNOSIS — Z9114 Patient's other noncompliance with medication regimen: Secondary | ICD-10-CM | POA: Diagnosis not present

## 2015-06-09 DIAGNOSIS — F19929 Other psychoactive substance use, unspecified with intoxication, unspecified: Secondary | ICD-10-CM

## 2015-06-09 DIAGNOSIS — F17210 Nicotine dependence, cigarettes, uncomplicated: Secondary | ICD-10-CM | POA: Diagnosis present

## 2015-06-09 DIAGNOSIS — F209 Schizophrenia, unspecified: Secondary | ICD-10-CM | POA: Diagnosis present

## 2015-06-09 DIAGNOSIS — F1220 Cannabis dependence, uncomplicated: Secondary | ICD-10-CM | POA: Diagnosis present

## 2015-06-09 DIAGNOSIS — Z590 Homelessness: Secondary | ICD-10-CM | POA: Diagnosis not present

## 2015-06-09 DIAGNOSIS — F411 Generalized anxiety disorder: Secondary | ICD-10-CM | POA: Diagnosis present

## 2015-06-09 DIAGNOSIS — S6992XD Unspecified injury of left wrist, hand and finger(s), subsequent encounter: Secondary | ICD-10-CM

## 2015-06-09 DIAGNOSIS — Z23 Encounter for immunization: Secondary | ICD-10-CM | POA: Diagnosis not present

## 2015-06-09 DIAGNOSIS — M25442 Effusion, left hand: Secondary | ICD-10-CM

## 2015-06-09 DIAGNOSIS — F17200 Nicotine dependence, unspecified, uncomplicated: Secondary | ICD-10-CM | POA: Diagnosis present

## 2015-06-09 DIAGNOSIS — S62603A Fracture of unspecified phalanx of left middle finger, initial encounter for closed fracture: Secondary | ICD-10-CM | POA: Diagnosis present

## 2015-06-09 DIAGNOSIS — T148XXA Other injury of unspecified body region, initial encounter: Secondary | ICD-10-CM

## 2015-06-09 DIAGNOSIS — F1020 Alcohol dependence, uncomplicated: Secondary | ICD-10-CM | POA: Diagnosis not present

## 2015-06-09 DIAGNOSIS — IMO0002 Reserved for concepts with insufficient information to code with codable children: Secondary | ICD-10-CM | POA: Diagnosis present

## 2015-06-09 DIAGNOSIS — Z841 Family history of disorders of kidney and ureter: Secondary | ICD-10-CM | POA: Diagnosis not present

## 2015-06-09 DIAGNOSIS — F10230 Alcohol dependence with withdrawal, uncomplicated: Secondary | ICD-10-CM | POA: Diagnosis present

## 2015-06-09 DIAGNOSIS — F333 Major depressive disorder, recurrent, severe with psychotic symptoms: Secondary | ICD-10-CM | POA: Diagnosis present

## 2015-06-09 DIAGNOSIS — X58XXXA Exposure to other specified factors, initial encounter: Secondary | ICD-10-CM | POA: Diagnosis present

## 2015-06-09 DIAGNOSIS — Z811 Family history of alcohol abuse and dependence: Secondary | ICD-10-CM

## 2015-06-09 DIAGNOSIS — F1994 Other psychoactive substance use, unspecified with psychoactive substance-induced mood disorder: Secondary | ICD-10-CM | POA: Diagnosis present

## 2015-06-09 DIAGNOSIS — K219 Gastro-esophageal reflux disease without esophagitis: Secondary | ICD-10-CM | POA: Diagnosis present

## 2015-06-09 DIAGNOSIS — R45850 Homicidal ideations: Secondary | ICD-10-CM | POA: Diagnosis present

## 2015-06-09 DIAGNOSIS — R45851 Suicidal ideations: Secondary | ICD-10-CM | POA: Diagnosis present

## 2015-06-09 DIAGNOSIS — Z720 Tobacco use: Secondary | ICD-10-CM | POA: Diagnosis present

## 2015-06-09 MED ORDER — THIAMINE HCL 100 MG/ML IJ SOLN: 100.0000 mg | Freq: Every day | INTRAMUSCULAR | Status: DC

## 2015-06-09 MED ORDER — VITAMIN B-1 100 MG PO TABS
100.0000 mg | ORAL_TABLET | Freq: Every day | ORAL | Status: DC
  Filled 2015-06-09 (×2): qty 1

## 2015-06-09 MED ORDER — LORAZEPAM 1 MG PO TABS: 0.0000 mg | ORAL_TABLET | Freq: Two times a day (BID) | ORAL | Status: DC

## 2015-06-09 MED ORDER — ALUM & MAG HYDROXIDE-SIMETH 200-200-20 MG/5ML PO SUSP
30.0000 mL | ORAL | Status: DC | PRN
  Administered 2015-06-13: 30 mL via ORAL
  Filled 2015-06-09: qty 30

## 2015-06-09 MED ORDER — ONDANSETRON HCL 4 MG PO TABS: 4.0000 mg | ORAL_TABLET | Freq: Three times a day (TID) | ORAL | Status: DC | PRN

## 2015-06-09 MED ORDER — PNEUMOCOCCAL VAC POLYVALENT 25 MCG/0.5ML IJ INJ: 0.5000 mL | INJECTION | INTRAMUSCULAR | Status: DC

## 2015-06-09 MED ORDER — ACETAMINOPHEN 325 MG PO TABS: 650.0000 mg | ORAL_TABLET | Freq: Four times a day (QID) | ORAL | Status: DC | PRN

## 2015-06-09 MED ORDER — LORAZEPAM 1 MG PO TABS: 0.0000 mg | ORAL_TABLET | Freq: Four times a day (QID) | ORAL | Status: AC

## 2015-06-09 MED ORDER — IBUPROFEN 600 MG PO TABS: 600.0000 mg | ORAL_TABLET | Freq: Three times a day (TID) | ORAL | Status: DC | PRN

## 2015-06-09 MED ORDER — NICOTINE 21 MG/24HR TD PT24
21.0000 mg | MEDICATED_PATCH | Freq: Every day | TRANSDERMAL | Status: DC
  Administered 2015-06-10 – 2015-06-13 (×4): 21 mg via TRANSDERMAL
  Filled 2015-06-09 (×8): qty 1

## 2015-06-09 NOTE — Progress Notes (Signed)
[facility] Group Notes:  (Nursing/MHT/Case Management/Adjunct)  Date:  06/09/2015  Time:  10:22 PM  Type of Therapy:  Psychoeducational Skills  Participation Level:  Minimal  Participation Quality:  Attentive  Affect:  Depressed  Cognitive:  Appropriate  Insight:  Lacking  Engagement in Group:  Lacking  Modes of Intervention:  Education  Summary of Progress/Problems: The patient shared with the group that he slept quite a bit today and therefore did not attend any of his groups. The patient was unable to state who his support system will be.   Hazle CocaGOODMAN, Joei Frangos S 06/09/2015, 10:22 PM

## 2015-06-09 NOTE — Clinical Social Work Note (Signed)
[facility]  CSW will have pt sign voluntary consents and provide RN with information.  Elray Buba.Athira Janowicz, LCSW [facility] Clinical Social Worker - Weekend Coverage cell #: [phone]

## 2015-06-09 NOTE — ED Notes (Signed)
[facility] Pt cooperative, Pt denies physical pain. Pt signed for personal belongings. Given to [facility] for transfer. Pt e-signed. Ambulatory out of facility.

## 2015-06-09 NOTE — Progress Notes (Signed)
Patient ID: Jimmy BrakemanJack L Merwin, male   DOB: 05-08-1955, 60 y.o.   MRN: [id number]   60 year old white male [facility] reported that he was positive SI/HI. Pt reported that he was positive SI/HI towards himself, pt reported that he just can't do it anymore. Pt reported that he was homeless and that he lives in a tent with brother and friends. Pt reported that he just needed at new start. Pt reported that he drinks alcohol everyday. Py reported that he drinks from the time he wakes up in the morning, until he goes to bed at night. Pt reported that he drinks a case of beer daily, and the prior to admission he was very wild. Pt reported that wild meant that he was punching walls and hitting anything that he could put his hands on. Pt reported that he uses no other drugs, and that he needed a long term rehab program. Pt reported that his depression was a 10, his hopelessness was a 10, and his anxiety was a 10. Pt reported that he was not on any medications and that he had no medical problems.

## 2015-06-09 NOTE — BH Assessment (Signed)
Patient was reassessed by TTS.   Patient came out of his shower into the room to be assessed. Patient denies SI/HI and AVH. Patient does not appear to be responding to internal stimuli.  Patient states that he has a history of Bipolar disorder. Patient states that he "I'm trying to get off the streets and get my own place so i need some counseling."   Consulted with Dr. Jannifer FranklinAkintayo who recommends inpatient treatment at this time.   Davina PokeJoVea Callahan Wild, LCSW Therapeutic Triage Specialist [facility] 06/09/2015 12:06 PM

## 2015-06-09 NOTE — Tx Team (Signed)
Initial Interdisciplinary Treatment Plan   PATIENT STRESSORS: Substance abuse   PATIENT STRENGTHS: Ability for insight Motivation for treatment/growth   PROBLEM LIST: Problem List/Patient Goals Date to be addressed Date deferred Reason deferred Estimated date of resolution  "I drink a case of beer daily" 06/09/15           "I was wild" 06/09/15           Depression with SI 06/09/15                              DISCHARGE CRITERIA:  Improved stabilization in mood, thinking, and/or behavior Need for constant or close observation no longer present  PRELIMINARY DISCHARGE PLAN: Placement in alternative living arrangements  PATIENT/FAMIILY INVOLVEMENT: This treatment plan has been presented to and reviewed with the patient, Irwin BrakemanJack L Kulaga, and/or family member.  The patient and family have been given the opportunity to ask questions and make suggestions.  Jacquelyne BalintForrest, Jahzeel Poythress Shanta 06/09/2015, 6:19 PM

## 2015-06-10 ENCOUNTER — Inpatient Hospital Stay (HOSPITAL_COMMUNITY)
Admission: EM | Admit: 2015-06-10 | Discharge: 2015-06-10 | Disposition: A | Discharge status: Home / Self Care | Payer: Federal, State, Local not specified - Other | Source: Intra-hospital | Attending: Family | Admitting: Family

## 2015-06-10 DIAGNOSIS — R45851 Suicidal ideations: Secondary | ICD-10-CM

## 2015-06-10 DIAGNOSIS — R45850 Homicidal ideations: Secondary | ICD-10-CM

## 2015-06-10 DIAGNOSIS — F1099 Alcohol use, unspecified with unspecified alcohol-induced disorder: Secondary | ICD-10-CM

## 2015-06-10 MED ORDER — VITAMIN B-1 100 MG PO TABS
100.0000 mg | ORAL_TABLET | Freq: Every day | ORAL | Status: DC
  Administered 2015-06-10 – 2015-06-13 (×4): 100 mg via ORAL
  Filled 2015-06-10 (×7): qty 1

## 2015-06-10 MED ORDER — TRAMADOL HCL 50 MG PO TABS
50.0000 mg | ORAL_TABLET | Freq: Three times a day (TID) | ORAL | Status: DC | PRN
  Administered 2015-06-10 – 2015-06-12 (×3): 50 mg via ORAL
  Filled 2015-06-10 (×3): qty 1

## 2015-06-10 MED ORDER — TRAZODONE HCL 50 MG PO TABS
50.0000 mg | ORAL_TABLET | Freq: Every evening | ORAL | Status: DC | PRN
  Administered 2015-06-10 – 2015-06-12 (×4): 50 mg via ORAL
  Filled 2015-06-10 (×14): qty 1

## 2015-06-10 MED ORDER — IBUPROFEN 600 MG PO TABS: 600.0000 mg | ORAL_TABLET | Freq: Four times a day (QID) | ORAL | Status: DC | PRN

## 2015-06-10 MED ORDER — BENZTROPINE MESYLATE 0.5 MG PO TABS
0.5000 mg | ORAL_TABLET | Freq: Two times a day (BID) | ORAL | Status: DC
  Administered 2015-06-10 – 2015-06-13 (×6): 0.5 mg via ORAL
  Filled 2015-06-10 (×14): qty 1

## 2015-06-10 MED ORDER — HYDROXYZINE HCL 25 MG PO TABS
25.0000 mg | ORAL_TABLET | Freq: Four times a day (QID) | ORAL | Status: AC | PRN
  Administered 2015-06-11 – 2015-06-12 (×2): 25 mg via ORAL
  Filled 2015-06-10 (×2): qty 1

## 2015-06-10 MED ORDER — LORAZEPAM 1 MG PO TABS
1.0000 mg | ORAL_TABLET | Freq: Three times a day (TID) | ORAL | Status: AC
  Administered 2015-06-10 (×3): 1 mg via ORAL
  Filled 2015-06-10 (×3): qty 1

## 2015-06-10 MED ORDER — ONDANSETRON 4 MG PO TBDP
4.0000 mg | ORAL_TABLET | Freq: Four times a day (QID) | ORAL | Status: AC | PRN
Start: 2015-06-10 — End: 2015-06-13

## 2015-06-10 MED ORDER — TUBERCULIN PPD 5 UNIT/0.1ML ID SOLN
5.0000 [IU] | Freq: Once | INTRADERMAL | Status: AC
  Administered 2015-06-10: 5 [IU] via INTRADERMAL

## 2015-06-10 MED ORDER — LORAZEPAM 1 MG PO TABS
1.0000 mg | ORAL_TABLET | Freq: Every day | ORAL | Status: AC
  Administered 2015-06-12: 1 mg via ORAL
  Filled 2015-06-10: qty 1

## 2015-06-10 MED ORDER — LOPERAMIDE HCL 2 MG PO CAPS: 2.0000 mg | ORAL_CAPSULE | ORAL | Status: AC | PRN

## 2015-06-10 MED ORDER — HALOPERIDOL 1 MG PO TABS
1.0000 mg | ORAL_TABLET | Freq: Two times a day (BID) | ORAL | Status: DC
  Administered 2015-06-10 – 2015-06-13 (×6): 1 mg via ORAL
  Filled 2015-06-10 (×2): qty 1
  Filled 2015-06-10: qty 2
  Filled 2015-06-10 (×5): qty 1
  Filled 2015-06-10: qty 2
  Filled 2015-06-10 (×5): qty 1

## 2015-06-10 MED ORDER — LORAZEPAM 1 MG PO TABS: 1.0000 mg | ORAL_TABLET | Freq: Four times a day (QID) | ORAL | Status: AC | PRN

## 2015-06-10 MED ORDER — LORAZEPAM 1 MG PO TABS
1.0000 mg | ORAL_TABLET | Freq: Two times a day (BID) | ORAL | Status: AC
  Administered 2015-06-11 (×2): 1 mg via ORAL
  Filled 2015-06-10 (×2): qty 1

## 2015-06-10 MED ORDER — ADULT MULTIVITAMIN W/MINERALS CH
1.0000 | ORAL_TABLET | Freq: Every day | ORAL | Status: DC
  Administered 2015-06-10 – 2015-06-13 (×4): 1 via ORAL
  Filled 2015-06-10 (×7): qty 1

## 2015-06-10 NOTE — Progress Notes (Signed)
D: Pt denies any form of depression, anxiety, pain, SI, HI or AVH; states "if you had asked me this question about 6 hours ago I could had given you a different answer; but I feel just fine now." Pt is however flat isolative and withdrawn to self-make minimal interactions. Pt remained calm and cooperative through the shift assessment. A: Medications offered as prescribed.  Support, encouragement, and safe environment provided.  15-minute safety checks continue. R: Pt was med compliant.  Pt attended group. Safety checks continue
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
## 2015-06-10 NOTE — Progress Notes (Signed)
Adult Psychoeducational Group Note  Date:  06/10/2015 Time:  10:33 PM  Group Topic/Focus:  Wrap-Up Group:   The focus of this group is to help patients review their daily goal of treatment and discuss progress on daily workbooks.  Participation Level:  Minimal  Participation Quality:  Appropriate  Affect:  Flat  Cognitive:  Oriented  Insight: Limited  Engagement in Group:  Engaged  Modes of Intervention:  Socialization and Support  Additional Comments:  Patient attended and participated in group tonight. He reports that he spent most of the day in bed today. He attended his groups and went for meals. Adult Psychoeducational Group Note  Date:  06/10/2015 Time:  10:38 PM  Group Topic/Focus:  Wrap-Up Group:   The focus of this group is to help patients review their daily goal of treatment and discuss progress on daily workbooks.  Participation Level:  Minimal  Participation Quality:  Appropriate  Affect:  Flat  Cognitive:  Appropriate  Insight: Appropriate  Engagement in Group:  Engaged  Modes of Intervention:  Socialization and Support  Additional Comments:Patient attended group  [name], Jimmy [name] 06/10/2015, 10:38 PM  [name], Jimmy [name] 06/10/2015, 10:33 PM
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
## 2015-06-10 NOTE — [facility] Group Notes (Signed)
[facility] Group Notes:  (Clinical Social Work)  06/10/2015  11:00AM-12:00PM  Summary of Progress/Problems:  The main focus of today's process group was to listen to a variety of genres of music and to identify that different types of music provoke different responses.  The patient then was able to identify personally what was soothing for them, as well as energizing, as well as how patient can personally use this knowledge in sleep habits, with depression, and with other symptoms.  The patient expressed at the beginning of group the overall feeling of "good" about being in the hospital, and seemed to enjoy the music throughout group without talking much.  Type of Therapy:  Music Therapy   Participation Level:  Active  Participation Quality:  Attentive   Affect:  Blunted  Cognitive:  Oriented  Insight:  Engaged  Engagement in Therapy:  Engaged  Modes of Intervention:   Activity, Exploration  Ambrose MantleMareida Grossman-Orr, LCSW 06/10/2015

## 2015-06-10 NOTE — Progress Notes (Signed)
D: Pt presents blunted in affect. Per pt, he is feeling "good".  Pt remained in his room for the majority of the evening. Pt was present for the evening wrap-up group. Pt had limited interactions with others. Pt denied any active withdrawal symptoms. Pt [facility]. Pt denies any SI/HI/AVH. A: Writer administered scheduled and prn medications to pt, per MD orders. Continued support and availability as needed was extended to this pt. Staff continues to monitor pt with q2215min checks.  R: No adverse drug reactions noted. Pt receptive to treatment. Pt remains safe at this time.    Pt reports 5/10 pain located in his left third phalange. Pain occuring with movement. Swelling noted. Pt given tramadol as recently ordered. AC was contacted in regards to obtaining a finger splint for pt's finger, per provider's order. Pt was assessed by the on-call provider this evening. Pt' had his third phalange buddy-taped to his second phalange for support. Refer to NP's progress note for further details (note time: 2232). Pt was asleep upon follow-up of pain level.

## 2015-06-10 NOTE — [facility] Group Notes (Signed)
[facility] Group Notes:  (Nursing/MHT/Case Management/Adjunct)  Date:  06/10/2015  Time:  0930  Type of Therapy:  Nurse Education  Participation Level:  Active  Participation Quality:  Appropriate and Attentive  Affect:  Appropriate  Cognitive:  Alert and Appropriate  Insight:  Appropriate  Engagement in Group:  Engaged  Modes of Intervention:  Discussion  Summary of Progress/Problems: Patient attended and participated in self-inventory group.  Patient rates his depression "1", feelings of helplessness/hopelessness "0" and his anxiety "0". Patient unable to identify a support system outside of the hospital.  Carleene OverlieMiddleton, Layken Beg P 06/10/2015, 0930
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
## 2015-06-10 NOTE — Progress Notes (Signed)
D- Patient is in a depressed mood with a flat affect.  Patient pleasant on the unit.  Patient has been observed in the Dayroom with minimal interaction with others.  Patient has c/o left middle finger swelling due to slamming his finger in car door prior to admission.  [facility]  Patient currently denies SI/HI/AVH.  Patient rates his depression "0". Feelings of hopelessness "5" and his anxiety "3" with 10 being the worst.    A- Scheduled medications administered to patient, per MD orders. Support and encouragement provided.  Routine safety checks conducted every 15 minutes.  Patient informed to notify staff with problems or concerns. R- No adverse drug reactions noted. Patient contracts for safety at this time. Patient compliant with medications and treatment plan. Patient receptive, calm, and cooperative.  Patient remains safe at this time.

## 2015-06-10 NOTE — [facility] Suicide Risk Assessment (Signed)
[facility] INPATIENT:  Family/Significant Other Suicide Prevention Education  Suicide Prevention Education:  Patient Refusal for Family/Significant Other Suicide Prevention Education: The patient Jimmy [name] has refused to provide written consent for family/significant other to be provided Family/Significant Other Suicide Prevention Education during admission and/or prior to discharge.  Physician notified.  Jimmy [name], Jimmy [name] 06/10/2015, 5:08 PM

## 2015-06-11 ENCOUNTER — Emergency Department (HOSPITAL_COMMUNITY): Admission: EM | Admit: 2015-06-11 | Discharge: 2015-06-11 | Discharge status: Absent without leave | Payer: Self-pay

## 2015-06-11 DIAGNOSIS — F19929 Other psychoactive substance use, unspecified with intoxication, unspecified: Secondary | ICD-10-CM

## 2015-06-11 DIAGNOSIS — T148XXA Other injury of unspecified body region, initial encounter: Secondary | ICD-10-CM

## 2015-06-11 DIAGNOSIS — F1020 Alcohol dependence, uncomplicated: Principal | ICD-10-CM

## 2015-06-11 DIAGNOSIS — F1994 Other psychoactive substance use, unspecified with psychoactive substance-induced mood disorder: Secondary | ICD-10-CM | POA: Diagnosis present

## 2015-06-11 DIAGNOSIS — F17200 Nicotine dependence, unspecified, uncomplicated: Secondary | ICD-10-CM

## 2015-06-11 DIAGNOSIS — F1220 Cannabis dependence, uncomplicated: Secondary | ICD-10-CM | POA: Diagnosis present

## 2015-06-11 LAB — LIPID PANEL
CHOLESTEROL: 175 mg/dL (ref 0–200)
HDL: 65 mg/dL (ref >40)
LDL CALC: 57 mg/dL (ref 0–99)
TRIGLYCERIDES: 266 mg/dL — AB (ref <150)
Total CHOL/HDL Ratio: 2.7 RATIO
VLDL: 53 mg/dL — AB (ref 0–40)
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
## 2015-06-11 NOTE — ED Notes (Signed)
[facility] Expected date:  Expected time:  Means of arrival:  Comments[facility]

## 2015-06-11 NOTE — Progress Notes (Signed)
Adult Psychoeducational Group Note  Date:  06/11/2015 Time:  9:14 PM  Group Topic/Focus:  Wrap-Up Group:   The focus of this group is to help patients review their daily goal of treatment and discuss progress on daily workbooks.  Participation Level:  Active  Participation Quality:  Appropriate  Affect:  Appropriate  Cognitive:  Appropriate  Insight: Appropriate  Engagement in Group:  Engaged  Modes of Intervention:  Discussion  Additional Comments: The patient expressed that he attend  All groups today.The patient also said that he had a good day .  Octavio Mannshigpen, Daniyah Fohl Lee 06/11/2015, 9:14 PM
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
## 2015-06-11 NOTE — Progress Notes (Signed)
NUTRITION NOTE  Consult received for hyperlipidemia. Per protocol, RN to provide pt with packet outlining general healthy eating as well as hyperlipidemia-specific diet education materials.  No further nutrition intervention warranted at this time. If further nutrition-related needs arise please re-consult.   Trenton GammonJessica Ambrose Wile, RD, LDN Inpatient Clinical Dietitian Pager # [phone] After hours/weekend pager # [phone]

## 2015-06-11 NOTE — ED Notes (Addendum)
Report given Lanora ManisElizabeth, RN  Phone [facility] [employer]

## 2015-06-11 NOTE — Discharge Instructions (Signed)
Mr. Jimmy BrakemanJack L [name],  Nice meeting you! Please follow-up with orthopedics as needed. Return to the emergency department if you develop numbness/tingling in your finger/hand, significant swelling, fevers, chills. Feel better soon!

## 2015-06-11 NOTE — ED Notes (Signed)
[facility] Pt had an injury to L middle finger.  Xrays resulted a fracture.  [facility] that the Pt removed "buddy" tape and is being sent back for splint placement.

## 2015-06-11 NOTE — ED Notes (Signed)
Sitter at bedside.

## 2015-06-11 NOTE — Progress Notes (Signed)
D: Pt A &O X4. Presents with blunt affect on initial approach but brightened up during assessment. Pt denies SI, HI, AVH and pain when assessed. Pt attended scheduled groups this shift. Compliant with medications when offered. [facility] his left middle finger and he tolerated it well. [facility].  A: Scheduled medications administered as per [facility]. Verbal encouragement offered to pt towards treatment compliance. Availability and support provided to pt throughout this shift. Q 15 minutes checks maintained for safety without behavioral outburst or self harm gestures to note. R: Pt receptive to care. POC maintained. Safety maintained on and off unit.
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
## 2015-06-12 LAB — BASIC METABOLIC PANEL
Anion gap: 12 (ref 5–15)
BUN: 13 mg/dL (ref 6–20)
CHLORIDE: 104 mmol/L (ref 101–111)
CO2: 21 mmol/L — ABNORMAL LOW (ref 22–32)
Calcium: 9.4 mg/dL (ref 8.9–10.3)
Creatinine, Ser: 0.96 mg/dL (ref 0.61–1.24)
GFR calc Af Amer: >60 mL/min (ref >60)
GFR calc non Af Amer: >60 mL/min (ref >60)
GLUCOSE: 107 mg/dL — AB (ref 65–99)
POTASSIUM: 4.4 mmol/L (ref 3.5–5.1)
Sodium: 137 mmol/L (ref 135–145)

## 2015-06-12 LAB — PROLACTIN: Prolactin: 29.9 ng/mL — ABNORMAL HIGH (ref 4.0–15.2)

## 2015-06-12 LAB — HEMOGLOBIN A1C
Hgb A1c MFr Bld: 5.5 % (ref 4.8–5.6)
MEAN PLASMA GLUCOSE: 111 mg/dL

## 2015-06-12 NOTE — Progress Notes (Signed)
Adult Psychoeducational Group Note  Date:  06/12/2015 Time: 09:15am  Group Topic/Focus:  Recovery Goals:   The focus of this group is to identify appropriate goals for recovery and establish a plan to achieve them.  Participation Level:  Active  Participation Quality:  Appropriate  Affect:  Flat  Cognitive:  Alert and Oriented  Insight: Good  Engagement in Group:  Engaged and Improving  Modes of Intervention:  Activity, Discussion, Education and Support  Additional Comments:  Pt voices plan to attend AA meetings and "commit to my recovery" post discharge.   Aurora Maskwyman, Dylan Ruotolo E 06/12/2015, 12:24 PM
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
## 2015-06-12 NOTE — Progress Notes (Signed)
D. Pt had been up and visible in milieu this evening, did attend and participate in evening group activity. Pt spoke of his day and spoke about getting a splint for his finger and also reported feelings of anxiety otherwise pt reports feeling ok and does not report and signs or symptoms and has not appeared to be in any acute withdrawal. Pt did receive all bedtime medications without incident. A. Support and encouragement provided. R. Safety maintained, will continue to monitor.

## 2015-06-12 NOTE — B[facility] Group Notes (Signed)
B[facility] LCSW Group Therapy  06/12/2015 , 1:41 PM   Type of Therapy:  Group Therapy  Participation Level:  Active  Participation Quality:  Attentive  Affect:  Appropriate  Cognitive:  Alert  Insight:  Improving  Engagement in Therapy:  Engaged  Modes of Intervention:  Discussion, Exploration and Socialization  Summary of Progress/Problems: Today's group focused on the term Diagnosis.  Participants were asked to define the term, and then pronounce whether it is a negative, positive or neutral term.  "I'm diagnosed with Bipolar D/O, and I know I do a lot better when I am taking my meds.  I am also diagnosed with alcoholism, which is why I am trying to get into [facility].  It gets in the way of me being healthy and happy."  Minimal interaction other than that, but was engaged the entire time.  Jimmy [name], Jimmy [name] 06/12/2015 , 1:41 PM

## 2015-06-12 NOTE — Progress Notes (Signed)
Patient ID: Jimmy BrakemanJack L [name], male   DOB: 1955/04/12, 60 y.o.   MRN: [id number]  Pt currently presents with a flat affect and cooperative behavior. Per self inventory, pt rates depression at a 2, hopelessness 0 and anxiety 0. [name] daily goal is to "maintain" and they intend to do so by "go to all the meetings." Pt reports good sleep, a good appetite, normal energy and good concentration. Pt reports no cravings today.   Pt provided with medications per providers orders. [name] labs and vitals were monitored throughout the day. Pt supported emotionally and encouraged to express concerns and questions. Pt educated on medications. PPD read, no induration noted. [facility] notified and verified.   [name] safety ensured with 15 minute and environmental checks. Pt currently denies SI/HI and A/V hallucinations. Pt verbally agrees to seek staff if SI/HI or A/VH occurs and to consult with staff before acting on these thoughts. Will continue POC.

## 2015-06-12 NOTE — BHH Group Notes (Signed)
[facility] Group Notes:  (Nursing/MHT/Case Management/Adjunct)  Date:  06/12/2015  Time:  3:14 PM  Type of Therapy:  Psychoeducational Skills  Participation Level:  Minimal  Participation Quality:  Attentive  Affect:  Flat  Cognitive:  Alert  Insight:  Lacking  Engagement in Group:  Lacking and Limited  Modes of Intervention:  Discussion  Summary of Progress/Problems:   Pt. Attended AM group, but did not offer any comments  Etc.  Arsenio LoaderHiatt, Georgian Mcclory Dudley 06/12/2015, 3:14 PM

## 2015-06-13 MED ORDER — HALOPERIDOL 1 MG PO TABS: 1.0000 mg | ORAL_TABLET | Freq: Two times a day (BID) | ORAL | Status: DC

## 2015-06-13 MED ORDER — HYDROXYZINE HCL 25 MG PO TABS
25.0000 mg | ORAL_TABLET | Freq: Four times a day (QID) | ORAL | Status: DC | PRN
  Filled 2015-06-13: qty 10

## 2015-06-13 MED ORDER — HYDROXYZINE HCL 25 MG PO TABS: 25.0000 mg | ORAL_TABLET | Freq: Four times a day (QID) | ORAL | Status: DC | PRN

## 2015-06-13 MED ORDER — NICOTINE 21 MG/24HR TD PT24: 21.0000 mg | MEDICATED_PATCH | Freq: Every day | TRANSDERMAL | Status: DC

## 2015-06-13 MED ORDER — TRAZODONE HCL 50 MG PO TABS: 50.0000 mg | ORAL_TABLET | Freq: Every evening | ORAL | Status: DC | PRN

## 2015-06-13 MED ORDER — BENZTROPINE MESYLATE 0.5 MG PO TABS: 0.5000 mg | ORAL_TABLET | Freq: Two times a day (BID) | ORAL | Status: DC

## 2015-06-13 NOTE — Tx Team (Signed)
Interdisciplinary Treatment Plan Update (Adult)  Date:  06/13/2015   Time Reviewed:  10:31 AM   Progress in Treatment: Attending groups: Yes. Participating in groups:  Yes. Taking medication as prescribed:  Yes. Tolerating medication:  Yes. Family/Significant other contact made:  No Patient understands diagnosis:  Yes  As evidenced by seeking help with "getting into [facility]" Discussing patient identified problems/goals with staff:  Yes, see initial care plan. Medical problems stabilized or resolved:  Yes. Denies suicidal/homicidal ideation: Yes. Issues/concerns per patient self-inventory:  No. Other:  New problem(s) identified:  Discharge Plan or Barriers: see below  Reason for Continuation of Hospitalization:   Comments:  Pt said that he would like to go to [facility], where he has been twice before, to get detoxed, and then stop using ETOH. Denies other drug use. 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 This winter he has been [place] the [place] with another man, and he is tired of it. He is calm and cooperative with mild withdrawal symptoms.  Ativan taper, Haldol, Cogentin, Trazodone trial  Estimated length of stay:D/C today  New goal(s):  Review of initial/current patient goals per problem list:   Review of initial/current patient goals per problem list:  1. Goal(s): Patient will participate in aftercare plan   Met: Yes   Target date: 3-5 days post admission date   As evidenced by: Patient will participate within aftercare plan AEB aftercare provider and housing plan at discharge being identified. 06/11/15:  Asking for referral to [place]; states back up plan is to return to woods 06/13/15:  [facility] said no based on past non-compliance with meds and past suicide attempts. Return to the woods, follow up [facility]   2. Goal (s): Patient will exhibit decreased depressive symptoms and suicidal ideations.   Met: Yes   Target date: 3-5 days post admission date    As evidenced by: Patient will utilize self rating of depression at 3 or below and demonstrate decreased signs of depression or be deemed stable for discharge by MD. 06/11/15:  Denies SI, rates depression a 0    3. Goal(s): Patient will demonstrate decreased signs and symptoms of anxiety.   Met: Yes   Target date: 3-5 days post admission date   As evidenced by: Patient will utilize self rating of anxiety at 3 or below and demonstrated decreased signs of anxiety, or be deemed stable for discharge by MD 06/11/15:  Rates anxiety a 3 today     4. Goal(s): Patient will demonstrate decreased signs of withdrawal due to substance abuse   Met: Yes   Target date: 3-5 days post admission date   As evidenced by: Patient will produce a CIWA/COWS score of 0, have stable vitals signs, and no symptoms of withdrawal 06/11/15:  CIWA score of 0 today, stable vitals           Attendees: Patient:  06/13/2015 10:31 AM   Family:   06/13/2015 10:31 AM   Physician:  Ursula Alert, MD 06/13/2015 10:31 AM   Nursing:   Hedy Jacob RN 06/13/2015 10:31 AM   CSW:    Roque Lias, LCSW   06/13/2015 10:31 AM   Other:  06/13/2015 10:31 AM   Other:   06/13/2015 10:31 AM   Other:  Lars Pinks, Nurse CM 06/13/2015 10:31 AM   Other:   06/13/2015 10:31 AM   Other:  Norberto Sorenson, [place]  06/13/2015 10:31 AM   Other:  06/13/2015 10:31 AM   Other:  06/13/2015 10:31 AM   Other:  06/13/2015 10:31 AM   Other:  06/13/2015 10:31 AM   Other:  06/13/2015 10:31 AM   Other:   06/13/2015 10:31 AM    Scribe for Treatment Team:   Trish Mage, 06/13/2015 10:31 AM

## 2015-06-13 NOTE — Progress Notes (Signed)
D. Pt had been up and visible in milieu this evening, seen interacting appropriately with peers. Pt spoke about how he has been doing ok but does reports some on-going anxiety, nausea and vomiting and reports that they may be from withdrawal. Pt did receive all medications without incident this evening before retiring for bed for the evening. A. Support and encouragement provided. R. Safety maintained, will continue to monitor.

## 2015-06-13 NOTE — [facility] Suicide Risk Assessment (Signed)
[facility]   Principal Problem: Substance or medication-induced bipolar and related disorder with onset during intoxication [facility](HCC) Discharge Diagnoses:  Patient Active Problem List   Diagnosis Date Noted  . Alcohol use disorder, severe, dependence (HCC) [F10.20] 06/11/2015  . 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 Fracture of bone [T14.8] 06/11/2015  . Polysubstance abuse [F19.10] 09/06/2014  . GIB (gastrointestinal bleeding) [K92.2] 09/06/2014  . Homeless [Z59.0] 09/06/2014  . GERD (gastroesophageal reflux disease) [K21.9]   . PUD (peptic ulcer disease) [K27.9]   . Gastroesophageal reflux disease without esophagitis [K21.9]   . Hypokalemia [E87.6]   . S/P alcohol detoxification [Z09] 12/01/2013  . Seizure (HCC) [R56.9] 08/24/2013  . Tobacco use disorder [F17.200] 08/19/2013    Total Time spent with patient: 30 minutes  Musculoskeletal: Strength & Muscle Tone: within normal limits Gait & Station: normal Patient leans: N/A  Psychiatric Specialty Exam: Review of Systems  Psychiatric/Behavioral: Positive for substance abuse. Negative for depression.  All other systems reviewed and are negative.   Blood pressure 109/68, pulse 106, temperature 97.6 F (36.4 C), temperature source Oral, resp. rate 20, height 5\' 8"  (1.727 m), weight 73.029 kg (161 lb), SpO2 99 %.Body mass index is 24.49 kg/(m^2).  General Appearance: Casual  Eye Contact::  Fair  Speech:  Clear and Coherent409  Volume:  Normal  Mood:  Euthymic  Affect:  Congruent  Thought Process:  Coherent  Orientation:  Full (Time, Place, and Person)  Thought Content:  WDL  Suicidal Thoughts:  No  Homicidal Thoughts:  No  Memory:  Immediate;   Fair Recent;   Fair Remote;   Fair  Judgement:  Fair  Insight:  Fair  Psychomotor Activity:  Normal  Concentration:  Fair  Recall:  [employer]  Fund of Knowledge:Fair   Language: Fair  Akathisia:  No  Handed:  Right  AIMS (if indicated):     Assets:  Desire for Improvement  Sleep:  Number of Hours: 6  Cognition: WNL  ADL's:  Intact   Mental Status Per Nursing Assessment::   On Admission:  Suicidal ideation indicated by patient  Demographic Factors:  Male and Caucasian  Loss Factors: NA  Historical Factors: Impulsivity  Risk Reduction Factors:   Positive coping skills or problem solving skills  Continued Clinical Symptoms:  Alcohol/Substance Abuse/Dependencies Previous Psychiatric Diagnoses and Treatments Medical Diagnoses and Treatments/Surgeries  Cognitive Features That Contribute To Risk:  None    Suicide Risk:  Minimal: No identifiable suicidal ideation.  Patients presenting with no risk factors but with morbid ruminations; may be classified as minimal risk based on the severity of the depressive symptoms  Follow-up Information    [facility]   Specialty:  Behavioral Health   Why:  Walk-In Clinic is open Monday-Friday 8am-3pm.  Go as early as possible and be prepared for a long wait to get reestablished as a client.   Contact information:   [address] [place] [address] [phone]       Schedule an appointment as soon as possible for a visit with [facility].   Why:  need to follow up for broken finger   Contact information:   [address] [place] [phone]      Plan Of Care/Follow-up recommendations:  Activity:  no restrictions Diet:  regular Tests:  as needed Other:  follow up with aftercare  Bertis Hustead, MD 06/13/2015, 11:25  AM

## 2015-06-13 NOTE — Progress Notes (Signed)
  [facility] :  Will you be returning to the same living situation after discharge:  Yes,  home At discharge, do you have transportation home?: Yes,  bus pass Do you have the ability to pay for your medications: Yes,  mental health  Release of information consent forms completed and in the chart;  Patient's signature needed at discharge.  Patient to Follow up at: Follow-up Information    [facility]   Specialty:  Behavioral Health   Why:  Walk-In Clinic is open Monday-Friday 8am-3pm.  Go as early as possible and be prepared for a long wait to get reestablished as a client.   Contact information:   [address] [place] [address] [phone]       Schedule an appointment as soon as possible for a visit with [facility].   Why:  need to follow up for broken finger   Contact information:   [address] [place] [phone]      Next level of care provider [facility] Link:no  Safety Planning and Suicide Prevention discussed: Yes,  yes  Have you used any form of tobacco in the last 30 days? (Cigarettes, Smokeless Tobacco, Cigars, and/or Pipes): No  Has patient been referred to the Quitline?: N/A patient is not a smoker  Patient has been referred for addiction treatment: Yes  Daryel Geraldorth, Julion Gatt B 06/13/2015, 10:30 AM

## 2015-06-13 NOTE — Progress Notes (Signed)
Pneumonia vaccine was administered by writer on 06/11/15 at approximately 1040. Medication was scanned and not accepted even after manually entering related information such as site, LOT #, Expiry date. Verbal education was done prior to administration and handout given to pt as well.  Pt did tolerated vaccine well. Voice no concerns thus far.

## 2015-06-13 NOTE — Progress Notes (Signed)
Patient  discharged home with samples and prescriptions. Patient was stable and appreciative at that time. All papers and prescriptions were given and valuables returned. Verbal understanding expressed. Denies SI/HI and A/VH. Patient given opportunity to express concerns and ask questions.  
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
## 2015-06-16 ENCOUNTER — Emergency Department (HOSPITAL_COMMUNITY)
Admission: EM | Admit: 2015-06-16 | Discharge: 2015-06-17 | Disposition: A | Discharge status: Home / Self Care | Payer: Self-pay | Attending: Emergency Medicine | Admitting: Emergency Medicine

## 2015-06-16 ENCOUNTER — Encounter (HOSPITAL_COMMUNITY): Payer: Self-pay

## 2015-06-16 DIAGNOSIS — Z8711 Personal history of peptic ulcer disease: Secondary | ICD-10-CM | POA: Insufficient documentation

## 2015-06-16 DIAGNOSIS — F319 Bipolar disorder, unspecified: Secondary | ICD-10-CM | POA: Insufficient documentation

## 2015-06-16 DIAGNOSIS — K2920 Alcoholic gastritis without bleeding: Secondary | ICD-10-CM | POA: Insufficient documentation

## 2015-06-16 DIAGNOSIS — F17210 Nicotine dependence, cigarettes, uncomplicated: Secondary | ICD-10-CM | POA: Insufficient documentation

## 2015-06-16 DIAGNOSIS — Z79899 Other long term (current) drug therapy: Secondary | ICD-10-CM | POA: Insufficient documentation

## 2015-06-16 DIAGNOSIS — Z9049 Acquired absence of other specified parts of digestive tract: Secondary | ICD-10-CM | POA: Insufficient documentation

## 2015-06-16 LAB — COMPREHENSIVE METABOLIC PANEL
ALT: 27 U/L (ref 17–63)
ANION GAP: 13 (ref 5–15)
AST: 31 U/L (ref 15–41)
Albumin: 4.2 g/dL (ref 3.5–5.0)
Alkaline Phosphatase: 72 U/L (ref 38–126)
BUN: 10 mg/dL (ref 6–20)
CHLORIDE: 94 mmol/L — AB (ref 101–111)
CO2: 26 mmol/L (ref 22–32)
CREATININE: 0.86 mg/dL (ref 0.61–1.24)
Calcium: 9.5 mg/dL (ref 8.9–10.3)
Glucose, Bld: 92 mg/dL (ref 65–99)
POTASSIUM: 3.9 mmol/L (ref 3.5–5.1)
SODIUM: 133 mmol/L — AB (ref 135–145)
Total Bilirubin: 0.8 mg/dL (ref 0.3–1.2)
Total Protein: 7.9 g/dL (ref 6.5–8.1)

## 2015-06-16 LAB — CBC
HEMATOCRIT: 39 % (ref 39.0–52.0)
HEMOGLOBIN: 13.8 g/dL (ref 13.0–17.0)
MCH: 32.1 pg (ref 26.0–34.0)
MCHC: 35.4 g/dL (ref 30.0–36.0)
MCV: 90.7 fL (ref 78.0–100.0)
Platelets: 333 10*3/uL (ref 150–400)
RBC: 4.3 MIL/uL (ref 4.22–5.81)
RDW: 12.8 % (ref 11.5–15.5)
WBC: 6.3 10*3/uL (ref 4.0–10.5)

## 2015-06-16 LAB — LIPASE, BLOOD: LIPASE: 33 U/L (ref 11–51)

## 2015-06-16 NOTE — ED Notes (Signed)
Patient called to treatment room, no answer.  

## 2015-06-16 NOTE — ED Notes (Signed)
Pt here with EMS.  Pt c/o abdominal pain with vomiting x 2 days. No fever.

## 2015-06-17 LAB — URINALYSIS, ROUTINE W REFLEX MICROSCOPIC
BILIRUBIN URINE: NEGATIVE
Glucose, UA: NEGATIVE mg/dL
Hgb urine dipstick: NEGATIVE
Ketones, ur: 15 mg/dL — AB
LEUKOCYTES UA: NEGATIVE
NITRITE: NEGATIVE
Protein, ur: NEGATIVE mg/dL
SPECIFIC GRAVITY, URINE: 1.016 (ref 1.005–1.030)
pH: 7.5 (ref 5.0–8.0)

## 2015-06-17 MED ORDER — GI COCKTAIL ~~LOC~~
30.0000 mL | Freq: Once | ORAL | Status: AC
  Administered 2015-06-17: 30 mL via ORAL
  Filled 2015-06-17: qty 30

## 2015-06-17 MED ORDER — ONDANSETRON HCL 4 MG/2ML IJ SOLN
4.0000 mg | Freq: Once | INTRAMUSCULAR | Status: DC | PRN
  Filled 2015-06-17: qty 2

## 2015-06-17 MED ORDER — OMEPRAZOLE 20 MG PO CPDR: 20.0000 mg | DELAYED_RELEASE_CAPSULE | Freq: Every day | ORAL | Status: DC

## 2015-06-17 MED ORDER — ONDANSETRON 4 MG PO TBDP
4.0000 mg | ORAL_TABLET | Freq: Once | ORAL | Status: AC | PRN
  Administered 2015-06-17: 4 mg via ORAL
  Filled 2015-06-17: qty 1

## 2015-06-17 NOTE — Discharge Instructions (Signed)

## 2015-06-17 NOTE — ED Notes (Signed)
Patient denies pain and is resting comfortably.  
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
## 2015-06-23 ENCOUNTER — Emergency Department (HOSPITAL_COMMUNITY)
Admission: EM | Admit: 2015-06-23 | Discharge: 2015-06-24 | Disposition: A | Discharge status: Other Acute Inpatient Hospital | Payer: Self-pay | Attending: Emergency Medicine | Admitting: Emergency Medicine

## 2015-06-23 ENCOUNTER — Encounter (HOSPITAL_COMMUNITY): Payer: Self-pay | Admitting: Emergency Medicine

## 2015-06-23 DIAGNOSIS — F1994 Other psychoactive substance use, unspecified with psychoactive substance-induced mood disorder: Secondary | ICD-10-CM

## 2015-06-23 DIAGNOSIS — F32A Depression, unspecified: Secondary | ICD-10-CM

## 2015-06-23 DIAGNOSIS — Y9389 Activity, other specified: Secondary | ICD-10-CM | POA: Insufficient documentation

## 2015-06-23 DIAGNOSIS — F1010 Alcohol abuse, uncomplicated: Secondary | ICD-10-CM

## 2015-06-23 DIAGNOSIS — K219 Gastro-esophageal reflux disease without esophagitis: Secondary | ICD-10-CM | POA: Insufficient documentation

## 2015-06-23 DIAGNOSIS — F3130 Bipolar disorder, current episode depressed, mild or moderate severity, unspecified: Secondary | ICD-10-CM | POA: Insufficient documentation

## 2015-06-23 DIAGNOSIS — Z79899 Other long term (current) drug therapy: Secondary | ICD-10-CM | POA: Insufficient documentation

## 2015-06-23 DIAGNOSIS — F329 Major depressive disorder, single episode, unspecified: Secondary | ICD-10-CM

## 2015-06-23 DIAGNOSIS — F10929 Alcohol use, unspecified with intoxication, unspecified: Secondary | ICD-10-CM

## 2015-06-23 DIAGNOSIS — X838XXA Intentional self-harm by other specified means, initial encounter: Secondary | ICD-10-CM | POA: Insufficient documentation

## 2015-06-23 DIAGNOSIS — F10129 Alcohol abuse with intoxication, unspecified: Secondary | ICD-10-CM | POA: Insufficient documentation

## 2015-06-23 DIAGNOSIS — S60811A Abrasion of right wrist, initial encounter: Secondary | ICD-10-CM | POA: Insufficient documentation

## 2015-06-23 DIAGNOSIS — Z8711 Personal history of peptic ulcer disease: Secondary | ICD-10-CM | POA: Insufficient documentation

## 2015-06-23 DIAGNOSIS — Y998 Other external cause status: Secondary | ICD-10-CM | POA: Insufficient documentation

## 2015-06-23 DIAGNOSIS — F17210 Nicotine dependence, cigarettes, uncomplicated: Secondary | ICD-10-CM | POA: Insufficient documentation

## 2015-06-23 DIAGNOSIS — F1014 Alcohol abuse with alcohol-induced mood disorder: Secondary | ICD-10-CM | POA: Insufficient documentation

## 2015-06-23 DIAGNOSIS — Y9289 Other specified places as the place of occurrence of the external cause: Secondary | ICD-10-CM | POA: Insufficient documentation

## 2015-06-23 LAB — CBC
HEMATOCRIT: 39.9 % (ref 39.0–52.0)
HEMOGLOBIN: 13.6 g/dL (ref 13.0–17.0)
MCH: 31.6 pg (ref 26.0–34.0)
MCHC: 34.1 g/dL (ref 30.0–36.0)
MCV: 92.6 fL (ref 78.0–100.0)
Platelets: 428 10*3/uL — ABNORMAL HIGH (ref 150–400)
RBC: 4.31 MIL/uL (ref 4.22–5.81)
RDW: 13.3 % (ref 11.5–15.5)
WBC: 8.5 10*3/uL (ref 4.0–10.5)

## 2015-06-23 LAB — SALICYLATE LEVEL: Salicylate Lvl: <4 mg/dL (ref 2.8–30.0)

## 2015-06-23 LAB — COMPREHENSIVE METABOLIC PANEL WITH GFR
ALT: 20 U/L (ref 17–63)
AST: 30 U/L (ref 15–41)
Albumin: 4.2 g/dL (ref 3.5–5.0)
Alkaline Phosphatase: 78 U/L (ref 38–126)
Anion gap: 13 (ref 5–15)
BUN: 7 mg/dL (ref 6–20)
CO2: 19 mmol/L — ABNORMAL LOW (ref 22–32)
Calcium: 9.6 mg/dL (ref 8.9–10.3)
Chloride: 105 mmol/L (ref 101–111)
Creatinine, Ser: 0.57 mg/dL — ABNORMAL LOW (ref 0.61–1.24)
GFR calc Af Amer: >60 mL/min
GFR calc non Af Amer: >60 mL/min
Glucose, Bld: 90 mg/dL (ref 65–99)
Potassium: 4 mmol/L (ref 3.5–5.1)
Sodium: 137 mmol/L (ref 135–145)
Total Bilirubin: 0.2 mg/dL — ABNORMAL LOW (ref 0.3–1.2)
Total Protein: 8.1 g/dL (ref 6.5–8.1)

## 2015-06-23 LAB — RAPID URINE DRUG SCREEN, HOSP PERFORMED
AMPHETAMINES: NOT DETECTED
BARBITURATES: NOT DETECTED
BENZODIAZEPINES: NOT DETECTED
COCAINE: NOT DETECTED
Opiates: NOT DETECTED
TETRAHYDROCANNABINOL: NOT DETECTED

## 2015-06-23 LAB — ETHANOL: ALCOHOL ETHYL (B): 271 mg/dL — AB (ref <5)

## 2015-06-23 LAB — ACETAMINOPHEN LEVEL

## 2015-06-23 MED ORDER — NICOTINE 21 MG/24HR TD PT24: 21.0000 mg | MEDICATED_PATCH | Freq: Every day | TRANSDERMAL | Status: DC

## 2015-06-23 MED ORDER — HYDROXYZINE HCL 25 MG PO TABS: 25.0000 mg | ORAL_TABLET | Freq: Four times a day (QID) | ORAL | Status: DC | PRN

## 2015-06-23 MED ORDER — LORAZEPAM 1 MG PO TABS: 0.0000 mg | ORAL_TABLET | Freq: Four times a day (QID) | ORAL | Status: DC

## 2015-06-23 MED ORDER — PANTOPRAZOLE SODIUM 40 MG PO TBEC: 40.0000 mg | DELAYED_RELEASE_TABLET | Freq: Every day | ORAL | Status: DC

## 2015-06-23 MED ORDER — LORAZEPAM 1 MG PO TABS: 0.0000 mg | ORAL_TABLET | Freq: Two times a day (BID) | ORAL | Status: DC

## 2015-06-23 MED ORDER — TRAZODONE HCL 50 MG PO TABS
50.0000 mg | ORAL_TABLET | Freq: Every evening | ORAL | Status: DC | PRN
  Administered 2015-06-24: 50 mg via ORAL
  Filled 2015-06-23: qty 1

## 2015-06-23 MED ORDER — BENZTROPINE MESYLATE 1 MG PO TABS
0.5000 mg | ORAL_TABLET | Freq: Two times a day (BID) | ORAL | Status: DC
  Administered 2015-06-24: 0.5 mg via ORAL
  Filled 2015-06-23: qty 1

## 2015-06-23 MED ORDER — HALOPERIDOL 1 MG PO TABS
1.0000 mg | ORAL_TABLET | Freq: Two times a day (BID) | ORAL | Status: DC
  Administered 2015-06-24: 1 mg via ORAL
  Filled 2015-06-23: qty 1
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
## 2015-06-23 NOTE — ED Notes (Signed)
Pt refusing blood work at this time until he can be guaranteed a bed. Pt advised that was not possible at this time

## 2015-06-23 NOTE — ED Notes (Addendum)
[name] with self inflicted scratches to R wrist, bandaged by EMS, no bleeding. Strong odor etoh, pt admits to drinking alcohol "all day" [employer] at bedside, pt is voluntary at this time

## 2015-06-23 NOTE — ED Notes (Signed)
Pt continues to yell out from consult room, pt wants to go to [place]icide [place]"
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
## 2015-06-23 NOTE — ED Notes (Signed)
Pt became belligerent during triage, ranting about leaving if he has to wait too long. Pt yelling at staff. Stating he "drank all he could today"

## 2015-06-24 ENCOUNTER — Observation Stay (HOSPITAL_COMMUNITY)
Admission: AD | Admit: 2015-06-24 | Discharge: 2015-06-24 | Disposition: A | Discharge status: Home / Self Care | Payer: Federal, State, Local not specified - Other | Source: Intra-hospital | Attending: Psychiatry | Admitting: Psychiatry

## 2015-06-24 ENCOUNTER — Encounter (HOSPITAL_COMMUNITY): Payer: Self-pay

## 2015-06-24 DIAGNOSIS — F1024 Alcohol dependence with alcohol-induced mood disorder: Principal | ICD-10-CM | POA: Insufficient documentation

## 2015-06-24 DIAGNOSIS — Z590 Homelessness: Secondary | ICD-10-CM | POA: Insufficient documentation

## 2015-06-24 DIAGNOSIS — F1994 Other psychoactive substance use, unspecified with psychoactive substance-induced mood disorder: Secondary | ICD-10-CM

## 2015-06-24 DIAGNOSIS — F1020 Alcohol dependence, uncomplicated: Secondary | ICD-10-CM | POA: Diagnosis present

## 2015-06-24 DIAGNOSIS — F1010 Alcohol abuse, uncomplicated: Secondary | ICD-10-CM | POA: Diagnosis present

## 2015-06-24 DIAGNOSIS — F319 Bipolar disorder, unspecified: Secondary | ICD-10-CM | POA: Insufficient documentation

## 2015-06-24 DIAGNOSIS — R45851 Suicidal ideations: Secondary | ICD-10-CM | POA: Insufficient documentation

## 2015-06-24 DIAGNOSIS — F411 Generalized anxiety disorder: Secondary | ICD-10-CM | POA: Insufficient documentation

## 2015-06-24 DIAGNOSIS — K219 Gastro-esophageal reflux disease without esophagitis: Secondary | ICD-10-CM | POA: Insufficient documentation

## 2015-06-24 DIAGNOSIS — Z8711 Personal history of peptic ulcer disease: Secondary | ICD-10-CM | POA: Insufficient documentation

## 2015-06-24 DIAGNOSIS — F209 Schizophrenia, unspecified: Secondary | ICD-10-CM | POA: Insufficient documentation

## 2015-06-24 DIAGNOSIS — G4700 Insomnia, unspecified: Secondary | ICD-10-CM | POA: Insufficient documentation

## 2015-06-24 DIAGNOSIS — F17210 Nicotine dependence, cigarettes, uncomplicated: Secondary | ICD-10-CM | POA: Insufficient documentation

## 2015-06-24 LAB — ETHANOL: Alcohol, Ethyl (B): 199 mg/dL — ABNORMAL HIGH (ref <5)

## 2015-06-24 MED ORDER — NICOTINE 21 MG/24HR TD PT24: 21.0000 mg | MEDICATED_PATCH | Freq: Every day | TRANSDERMAL | Status: DC

## 2015-06-24 MED ORDER — HALOPERIDOL 1 MG PO TABS: 1.0000 mg | ORAL_TABLET | Freq: Two times a day (BID) | ORAL | Status: DC

## 2015-06-24 MED ORDER — BENZTROPINE MESYLATE 0.5 MG PO TABS
0.5000 mg | ORAL_TABLET | Freq: Two times a day (BID) | ORAL | Status: DC
  Administered 2015-06-24: 0.5 mg via ORAL
  Filled 2015-06-24: qty 1

## 2015-06-24 MED ORDER — HYDROXYZINE HCL 25 MG PO TABS: 25.0000 mg | ORAL_TABLET | Freq: Four times a day (QID) | ORAL | Status: DC | PRN

## 2015-06-24 MED ORDER — NICOTINE 21 MG/24HR TD PT24
21.0000 mg | MEDICATED_PATCH | Freq: Every day | TRANSDERMAL | Status: DC
  Administered 2015-06-24: 21 mg via TRANSDERMAL
  Filled 2015-06-24: qty 1

## 2015-06-24 MED ORDER — LOPERAMIDE HCL 2 MG PO CAPS: 2.0000 mg | ORAL_CAPSULE | ORAL | Status: DC | PRN

## 2015-06-24 MED ORDER — ADULT MULTIVITAMIN W/MINERALS CH
1.0000 | ORAL_TABLET | Freq: Every day | ORAL | Status: DC
  Administered 2015-06-24: 1 via ORAL
  Filled 2015-06-24: qty 1

## 2015-06-24 MED ORDER — ONDANSETRON 4 MG PO TBDP: 4.0000 mg | ORAL_TABLET | Freq: Four times a day (QID) | ORAL | Status: DC | PRN

## 2015-06-24 MED ORDER — TRAZODONE HCL 50 MG PO TABS: 50.0000 mg | ORAL_TABLET | Freq: Every evening | ORAL | Status: DC | PRN

## 2015-06-24 MED ORDER — LORAZEPAM 1 MG PO TABS: 1.0000 mg | ORAL_TABLET | Freq: Four times a day (QID) | ORAL | Status: DC | PRN

## 2015-06-24 MED ORDER — HALOPERIDOL 1 MG PO TABS
1.0000 mg | ORAL_TABLET | Freq: Two times a day (BID) | ORAL | Status: DC
  Administered 2015-06-24: 1 mg via ORAL
  Filled 2015-06-24: qty 1

## 2015-06-24 MED ORDER — LORAZEPAM 1 MG PO TABS: 1.0000 mg | ORAL_TABLET | Freq: Three times a day (TID) | ORAL | Status: DC

## 2015-06-24 MED ORDER — LORAZEPAM 1 MG PO TABS
1.0000 mg | ORAL_TABLET | Freq: Four times a day (QID) | ORAL | Status: DC
  Administered 2015-06-24: 1 mg via ORAL
  Filled 2015-06-24: qty 1

## 2015-06-24 MED ORDER — LORAZEPAM 1 MG PO TABS
1.0000 mg | ORAL_TABLET | Freq: Two times a day (BID) | ORAL | Status: DC
Start: 2015-06-26 — End: 2015-06-24

## 2015-06-24 MED ORDER — VITAMIN B-1 100 MG PO TABS: 100.0000 mg | ORAL_TABLET | Freq: Every day | ORAL | Status: DC

## 2015-06-24 MED ORDER — ACETAMINOPHEN 325 MG PO TABS: 650.0000 mg | ORAL_TABLET | Freq: Four times a day (QID) | ORAL | Status: DC | PRN

## 2015-06-24 MED ORDER — BENZTROPINE MESYLATE 0.5 MG PO TABS: 0.5000 mg | ORAL_TABLET | Freq: Two times a day (BID) | ORAL | Status: DC

## 2015-06-24 MED ORDER — LORAZEPAM 1 MG PO TABS: 1.0000 mg | ORAL_TABLET | Freq: Every day | ORAL | Status: DC

## 2015-06-24 MED ORDER — PANTOPRAZOLE SODIUM 40 MG PO TBEC
40.0000 mg | DELAYED_RELEASE_TABLET | Freq: Every day | ORAL | Status: DC
  Administered 2015-06-24: 40 mg via ORAL
  Filled 2015-06-24: qty 1

## 2015-06-24 NOTE — BH Assessment (Signed)
Assessment completed. Consulted Alberteen SamFran Hobson, NP who recommended that pt be sent to the observation unit.  Informed Dr. Bebe ShaggyWickline of the recommendation.

## 2015-06-24 NOTE — ED Notes (Signed)
Per lab etoh has not begun to be ran yet, she is performing QC's on machines.

## 2015-06-24 NOTE — Tx Team (Signed)
Initial Interdisciplinary Treatment Plan   PATIENT STRESSORS: Financial difficulties Substance abuse   PATIENT STRENGTHS: Average or above average intelligence Communication skills   PROBLEM LIST: Problem List/Patient Goals Date to be addressed Date deferred Reason deferred Estimated date of resolution  SI 06/24/15     "I just want to get life over with"      HI "I fight other homeless people if the get in my way" 06/24/15     ETOH Abuse "I drink 24 cans of ber a day if I can get it" 06/24/15                                    DISCHARGE CRITERIA:  Ability to meet basic life and health needs Improved stabilization in mood, thinking, and/or behavior Medical problems require only outpatient monitoring Motivation to continue treatment in a less acute level of care Reduction of life-threatening or endangering symptoms to within safe limits Verbal commitment to aftercare and medication compliance Withdrawal symptoms are absent or subacute and managed without 24-hour nursing intervention  PRELIMINARY DISCHARGE PLAN: Attend aftercare/continuing care group Attend 12-step recovery group Outpatient therapy  PATIENT/FAMIILY INVOLVEMENT: This treatment plan has been presented to and reviewed with the patient, Jimmy [name]. The patient has been given the opportunity to ask questions and make suggestions.  Sylvan CheeseSteven M Myndi Wamble 06/24/2015, 4:15 AM
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
## 2015-06-24 NOTE — Discharge Instructions (Signed)
[facility]. You are also encouraged to go to the [facility] [you have expressed that you have the info on hand]. You are also [facility] to [facility] and [facility] for your rehabilitation needs.

## 2015-06-24 NOTE — Progress Notes (Signed)
Nursing Shift Note  Patient is alert, cooperative, appropriate in behavior and communcation, states he denies any SI/HI/AVH but does express "i'm worried about where im gonna go from here, i thinik they'll be kicking me out of here back on the street" Patient is comp[liant with medication administration, and is taking a shower immedicately after Nurse adminiistering patient's scheduled morning medicaitons. Nurse administering medications as ordered, providing therapeutic communication and emotional support as well. Nurse also ensuring continuous observation of patient for safety except when patient in the bathrooom. Patient remains safe on Unit.

## 2015-06-24 NOTE — ED Notes (Signed)
[facility] pt assigned to Bed 5 in OBS unit.
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
## 2015-06-24 NOTE — [facility] Counselor (Signed)
Consulted with Denice BorsShuvon, NP regarding pt. Care/ disposition. This Clinical research associatewriter [facility] and left message[ no pt info disclosed], and contact [facility] in order to help pt. With continued care. Left voice messages. [facility] not operating on business hours today. Jarod Bozzo K. Sherlon HandingHarris, LCAS-A, LPC-A, [facility]  Counselor 06/24/2015 10:40 AM

## 2015-06-24 NOTE — Progress Notes (Signed)
Nursing Discharge Note:  Patient continues to deny any SI/HI/AVH. Patient receiving prescription and copy of AVS Discharge Packet, states understanding of followup instructions and also medications and how to get them filled. Patient signing another complete copy of the Discharge AVS for the chart and also signing his Belongings sheet after receiving all of his things from the locked locker in the Search Room. Staff member escorting patient to the Lobby where he is exiting on foot to catch the bus, having been issued bus pass.
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
## 2015-06-24 NOTE — ED Notes (Signed)
[facility] called for transportation to [facility].  

## 2015-06-28 ENCOUNTER — Encounter (HOSPITAL_COMMUNITY): Payer: Self-pay | Admitting: Emergency Medicine

## 2015-06-28 ENCOUNTER — Emergency Department (HOSPITAL_COMMUNITY)
Admission: EM | Admit: 2015-06-28 | Discharge: 2015-06-29 | Disposition: A | Discharge status: Home / Self Care | Payer: Federal, State, Local not specified - Other | Attending: Emergency Medicine | Admitting: Emergency Medicine

## 2015-06-28 DIAGNOSIS — F17210 Nicotine dependence, cigarettes, uncomplicated: Secondary | ICD-10-CM | POA: Insufficient documentation

## 2015-06-28 DIAGNOSIS — F1024 Alcohol dependence with alcohol-induced mood disorder: Secondary | ICD-10-CM | POA: Insufficient documentation

## 2015-06-28 DIAGNOSIS — F419 Anxiety disorder, unspecified: Secondary | ICD-10-CM | POA: Insufficient documentation

## 2015-06-28 DIAGNOSIS — K219 Gastro-esophageal reflux disease without esophagitis: Secondary | ICD-10-CM | POA: Insufficient documentation

## 2015-06-28 DIAGNOSIS — Z79899 Other long term (current) drug therapy: Secondary | ICD-10-CM | POA: Insufficient documentation

## 2015-06-28 DIAGNOSIS — F1094 Alcohol use, unspecified with alcohol-induced mood disorder: Secondary | ICD-10-CM | POA: Diagnosis present

## 2015-06-28 DIAGNOSIS — Z8711 Personal history of peptic ulcer disease: Secondary | ICD-10-CM | POA: Insufficient documentation

## 2015-06-28 DIAGNOSIS — F1020 Alcohol dependence, uncomplicated: Secondary | ICD-10-CM | POA: Diagnosis present

## 2015-06-28 DIAGNOSIS — F319 Bipolar disorder, unspecified: Secondary | ICD-10-CM | POA: Insufficient documentation

## 2015-06-28 LAB — CBC
HEMATOCRIT: 39.1 % (ref 39.0–52.0)
Hemoglobin: 13.7 g/dL (ref 13.0–17.0)
MCH: 32 pg (ref 26.0–34.0)
MCHC: 35 g/dL (ref 30.0–36.0)
MCV: 91.4 fL (ref 78.0–100.0)
Platelets: 349 10*3/uL (ref 150–400)
RBC: 4.28 MIL/uL (ref 4.22–5.81)
RDW: 13.2 % (ref 11.5–15.5)
WBC: 7.7 10*3/uL (ref 4.0–10.5)

## 2015-06-28 LAB — COMPREHENSIVE METABOLIC PANEL
ALBUMIN: 4.2 g/dL (ref 3.5–5.0)
ALK PHOS: 72 U/L (ref 38–126)
ALT: 18 U/L (ref 17–63)
ANION GAP: 11 (ref 5–15)
AST: 26 U/L (ref 15–41)
BILIRUBIN TOTAL: 0.3 mg/dL (ref 0.3–1.2)
BUN: 10 mg/dL (ref 6–20)
CALCIUM: 9.8 mg/dL (ref 8.9–10.3)
CO2: 25 mmol/L (ref 22–32)
Chloride: 100 mmol/L — ABNORMAL LOW (ref 101–111)
Creatinine, Ser: 0.69 mg/dL (ref 0.61–1.24)
GLUCOSE: 92 mg/dL (ref 65–99)
Potassium: 3.7 mmol/L (ref 3.5–5.1)
Sodium: 136 mmol/L (ref 135–145)
TOTAL PROTEIN: 8.1 g/dL (ref 6.5–8.1)

## 2015-06-28 LAB — SALICYLATE LEVEL: Salicylate Lvl: <4 mg/dL (ref 2.8–30.0)

## 2015-06-28 LAB — ETHANOL: ALCOHOL ETHYL (B): 19 mg/dL — AB (ref <5)

## 2015-06-28 LAB — RAPID URINE DRUG SCREEN, HOSP PERFORMED
AMPHETAMINES: NOT DETECTED
BENZODIAZEPINES: NOT DETECTED
Barbiturates: NOT DETECTED
COCAINE: NOT DETECTED
OPIATES: NOT DETECTED
TETRAHYDROCANNABINOL: NOT DETECTED

## 2015-06-28 LAB — ACETAMINOPHEN LEVEL

## 2015-06-28 MED ORDER — TRAZODONE HCL 50 MG PO TABS: 50.0000 mg | ORAL_TABLET | Freq: Every evening | ORAL | Status: DC | PRN

## 2015-06-28 MED ORDER — BENZTROPINE MESYLATE 1 MG PO TABS
0.5000 mg | ORAL_TABLET | Freq: Two times a day (BID) | ORAL | Status: DC
  Administered 2015-06-29 (×2): 0.5 mg via ORAL
  Filled 2015-06-28 (×2): qty 1

## 2015-06-28 MED ORDER — HYDROXYZINE HCL 25 MG PO TABS: 25.0000 mg | ORAL_TABLET | Freq: Four times a day (QID) | ORAL | Status: DC | PRN

## 2015-06-28 MED ORDER — HALOPERIDOL 1 MG PO TABS
1.0000 mg | ORAL_TABLET | Freq: Two times a day (BID) | ORAL | Status: DC
  Administered 2015-06-29 (×2): 1 mg via ORAL
  Filled 2015-06-28 (×2): qty 1
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
## 2015-06-28 NOTE — ED Notes (Signed)
Per patient states he is suicidal because it it going to be cold tonight-states he cant get his meds because he doesn't have an ID

## 2015-06-28 NOTE — ED Notes (Addendum)
Pt has been changed into scrubs, seen and wanded by security. Pt has 3.24 in cash and coins.
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
## 2015-06-28 NOTE — BH Assessment (Signed)
Assessment completed. Consulted Fran Hobson, NP who recommended inpatient treatment. TTS to seek placement. Informed Dr. Nguyen of the recommendation.  

## 2015-06-29 DIAGNOSIS — F1094 Alcohol use, unspecified with alcohol-induced mood disorder: Secondary | ICD-10-CM | POA: Diagnosis not present

## 2015-06-29 NOTE — ED Notes (Signed)
MD at bedside. 

## 2015-06-29 NOTE — ED Notes (Signed)
EDP J MADE AWARE OF PT CURRENT STATUS AND CONVERSATION. .Marland Kitchen

## 2015-06-29 NOTE — ED Notes (Signed)
Discussed with pt possible delay in bed placement. Pt became very agitated stating he was no longer SI. 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  He wanted to leave. Discussed with pt if bed was available would this pt have SI thoughts? Pt states "he would take his pills right this time and finish the job". Pt returned to stretcher without event. Pt agreed for now to stay. Leadership Liberty HandyJennifer H RN made aware of pt current status.

## 2015-06-29 NOTE — [facility] Suicide Risk Assessment (Signed)
Suicide Risk Assessment  Discharge Assessment   [facility]   Principal Problem: Alcohol-induced mood disorder [facility](HCC) Discharge Diagnoses:  Patient Active Problem List   Diagnosis Date Noted  . Alcohol-induced mood disorder (HCC) [F10.94] 06/29/2015    Priority: High  . Alcohol use disorder, severe, dependence (HCC) [F10.20] 06/11/2015    Priority: High  . Severe alcohol use disorder (HCC) [F10.20] 06/24/2015  . Substance induced mood disorder (HCC) [F19.94] 06/24/2015  . 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 Fracture of bone [T14.8] 06/11/2015  . Polysubstance abuse [F19.10] 09/06/2014  . GIB (gastrointestinal bleeding) [K92.2] 09/06/2014  . Homeless [Z59.0] 09/06/2014  . GERD (gastroesophageal reflux disease) [K21.9]   . PUD (peptic ulcer disease) [K27.9]   . Gastroesophageal reflux disease without esophagitis [K21.9]   . Hypokalemia [E87.6]   . S/P alcohol detoxification [Z09] 12/01/2013  . Seizure (HCC) [R56.9] 08/24/2013  . Tobacco use disorder [F17.200] 08/19/2013    Total Time spent with patient: 45 minutes    Musculoskeletal: Strength & Muscle Tone: within normal limits Gait & Station: normal Patient leans: N/A  Psychiatric Specialty Exam: Review of Systems  Constitutional: Negative.   HENT: Negative.   Eyes: Negative.   Respiratory: Negative.   Cardiovascular: Negative.   Gastrointestinal: Negative.   Genitourinary: Negative.   Musculoskeletal: Negative.   Skin: Negative.   Neurological: Negative.   Endo/Heme/Allergies: Negative.   Psychiatric/Behavioral: Positive for substance abuse.    Blood pressure 113/65, pulse 85, temperature 98.6 F (37 C), temperature source Oral, resp. rate 18, SpO2 95 %.There is no weight on file to calculate BMI.  General Appearance: Disheveled  Eye Contact::  Good  Speech:  Normal Rate  Volume:  Normal  Mood:   Euthymic  Affect:  Congruent  Thought Process:  Coherent  Orientation:  Full (Time, Place, and Person)  Thought Content:  WDL  Suicidal Thoughts:  No  Homicidal Thoughts:  No  Memory:  Immediate;   Good Recent;   Good Remote;   Good  Judgement:  Fair  Insight:  Fair  Psychomotor Activity:  Normal  Concentration:  Good  Recall:  Good  Fund of Knowledge:Good  Language: Good  Akathisia:  No  Handed:  Right  AIMS (if indicated):     Assets:  Leisure Time Physical Health Resilience  ADL's:  Intact  Cognition: WNL  Sleep:       Mental Status Per Nursing Assessment::   On Admission:   alcohol. Intoxication with suicidal ideations  Demographic Factors:  Male and Caucasian  Loss Factors: NA  Historical Factors: Family history of mental illness or substance abuse  Risk Reduction Factors:   Positive social support  Continued Clinical Symptoms:  None  Cognitive Features That Contribute To Risk:  None    Suicide Risk:  Minimal: No identifiable suicidal ideation.  Patients presenting with no risk factors but with morbid ruminations; may be classified as minimal risk based on the severity of the depressive symptoms    Plan Of Care/Follow-up recommendations:  Activity:  as tolerated Diet:  heart healthy diet  LORD, JAMISON, NP 06/29/2015, 2:42 PM

## 2015-06-29 NOTE — ED Provider Notes (Signed)
8:20 AM patient alert cooperative, Glasgow Coma Score 15 eating breakfast in bed. I've advised him that we are working on getting him an inpatient psychiatric bed. He is agreeable.  Doug SouSam Abimelec Grochowski, MD 06/29/15 [phone]
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
## 2015-07-13 ENCOUNTER — Emergency Department (HOSPITAL_COMMUNITY): Payer: Self-pay

## 2015-07-13 ENCOUNTER — Emergency Department (HOSPITAL_COMMUNITY)
Admission: EM | Admit: 2015-07-13 | Discharge: 2015-07-14 | Disposition: A | Discharge status: Other Acute Inpatient Hospital | Payer: Federal, State, Local not specified - Other | Attending: Emergency Medicine | Admitting: Emergency Medicine

## 2015-07-13 ENCOUNTER — Encounter (HOSPITAL_COMMUNITY): Payer: Self-pay

## 2015-07-13 DIAGNOSIS — F1020 Alcohol dependence, uncomplicated: Secondary | ICD-10-CM

## 2015-07-13 DIAGNOSIS — Y9289 Other specified places as the place of occurrence of the external cause: Secondary | ICD-10-CM | POA: Insufficient documentation

## 2015-07-13 DIAGNOSIS — S62633D Displaced fracture of distal phalanx of left middle finger, subsequent encounter for fracture with routine healing: Secondary | ICD-10-CM | POA: Insufficient documentation

## 2015-07-13 DIAGNOSIS — R45850 Homicidal ideations: Secondary | ICD-10-CM | POA: Insufficient documentation

## 2015-07-13 DIAGNOSIS — F329 Major depressive disorder, single episode, unspecified: Secondary | ICD-10-CM | POA: Insufficient documentation

## 2015-07-13 DIAGNOSIS — F99 Mental disorder, not otherwise specified: Secondary | ICD-10-CM | POA: Insufficient documentation

## 2015-07-13 DIAGNOSIS — S62629G Displaced fracture of medial phalanx of unspecified finger, subsequent encounter for fracture with delayed healing: Secondary | ICD-10-CM

## 2015-07-13 DIAGNOSIS — R45851 Suicidal ideations: Secondary | ICD-10-CM | POA: Insufficient documentation

## 2015-07-13 DIAGNOSIS — F17210 Nicotine dependence, cigarettes, uncomplicated: Secondary | ICD-10-CM | POA: Insufficient documentation

## 2015-07-13 DIAGNOSIS — F209 Schizophrenia, unspecified: Secondary | ICD-10-CM | POA: Insufficient documentation

## 2015-07-13 DIAGNOSIS — W230XXD Caught, crushed, jammed, or pinched between moving objects, subsequent encounter: Secondary | ICD-10-CM | POA: Insufficient documentation

## 2015-07-13 DIAGNOSIS — Y999 Unspecified external cause status: Secondary | ICD-10-CM | POA: Insufficient documentation

## 2015-07-13 DIAGNOSIS — Y939 Activity, unspecified: Secondary | ICD-10-CM | POA: Insufficient documentation

## 2015-07-13 DIAGNOSIS — F1094 Alcohol use, unspecified with alcohol-induced mood disorder: Secondary | ICD-10-CM | POA: Diagnosis present

## 2015-07-13 DIAGNOSIS — K219 Gastro-esophageal reflux disease without esophagitis: Secondary | ICD-10-CM | POA: Insufficient documentation

## 2015-07-13 LAB — COMPREHENSIVE METABOLIC PANEL
ALBUMIN: 4.3 g/dL (ref 3.5–5.0)
ALT: 19 U/L (ref 17–63)
ANION GAP: 7 (ref 5–15)
AST: 29 U/L (ref 15–41)
Alkaline Phosphatase: 85 U/L (ref 38–126)
BUN: 8 mg/dL (ref 6–20)
CO2: 28 mmol/L (ref 22–32)
Calcium: 9.2 mg/dL (ref 8.9–10.3)
Chloride: 104 mmol/L (ref 101–111)
Creatinine, Ser: 0.95 mg/dL (ref 0.61–1.24)
GFR calc Af Amer: >60 mL/min (ref >60)
GFR calc non Af Amer: >60 mL/min (ref >60)
Glucose, Bld: 87 mg/dL (ref 65–99)
POTASSIUM: 3.9 mmol/L (ref 3.5–5.1)
SODIUM: 139 mmol/L (ref 135–145)
Total Bilirubin: 0.4 mg/dL (ref 0.3–1.2)
Total Protein: 8.2 g/dL — ABNORMAL HIGH (ref 6.5–8.1)

## 2015-07-13 LAB — SALICYLATE LEVEL: Salicylate Lvl: <4 mg/dL (ref 2.8–30.0)

## 2015-07-13 LAB — CBC
HCT: 39.2 % (ref 39.0–52.0)
HEMOGLOBIN: 14 g/dL (ref 13.0–17.0)
MCH: 32.9 pg (ref 26.0–34.0)
MCHC: 35.7 g/dL (ref 30.0–36.0)
MCV: 92 fL (ref 78.0–100.0)
Platelets: 341 10*3/uL (ref 150–400)
RBC: 4.26 MIL/uL (ref 4.22–5.81)
RDW: 13.3 % (ref 11.5–15.5)
WBC: 8.3 10*3/uL (ref 4.0–10.5)

## 2015-07-13 LAB — RAPID URINE DRUG SCREEN, HOSP PERFORMED
Amphetamines: NOT DETECTED
BARBITURATES: NOT DETECTED
BENZODIAZEPINES: NOT DETECTED
COCAINE: NOT DETECTED
Opiates: NOT DETECTED
TETRAHYDROCANNABINOL: NOT DETECTED

## 2015-07-13 LAB — ACETAMINOPHEN LEVEL

## 2015-07-13 LAB — ETHANOL: Alcohol, Ethyl (B): 123 mg/dL — ABNORMAL HIGH (ref <5)

## 2015-07-13 NOTE — ED Notes (Addendum)
Per EMS, [place] [employer] [place] c/o SI w/ plan to cut wrist and HI toward his brother x "years" and L middle finger injury/pain x "months."  Pain score 6/10.  Denies ETOH and Drug abuse.  Pt ran of Haldol x 2 days ago.  Pt reports that he is homeless.  Sts he recently attempted suicidal by cutting wrists w/ glass.  No lacerations or healing wounds noted.        Pt initially denied SI and HI with EMS and ED staff, but stated he would become that way w/o his medications.  Pt quickly changed his story w/ ED staff.
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
## 2015-07-13 NOTE — BH Assessment (Signed)
Assessment completed. Consulted Fran Hobson, NP who recommended inpatient treatment. TTS to seek placement. Informed Serena Sam, PA-C of the recommendation.  

## 2015-07-13 NOTE — ED Notes (Signed)
1 back pack to locker 32 and 1 large plastic bag labled and behind nurses station.
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
## 2015-07-14 ENCOUNTER — Encounter (HOSPITAL_COMMUNITY): Payer: Self-pay

## 2015-07-14 ENCOUNTER — Inpatient Hospital Stay (HOSPITAL_COMMUNITY)
Admission: AD | Admit: 2015-07-14 | Discharge: 2015-07-18 | DRG: 897 | Disposition: A | Discharge status: Home / Self Care | Payer: Federal, State, Local not specified - Other | Source: Intra-hospital | Attending: Psychiatry | Admitting: Psychiatry

## 2015-07-14 DIAGNOSIS — F1094 Alcohol use, unspecified with alcohol-induced mood disorder: Secondary | ICD-10-CM | POA: Diagnosis not present

## 2015-07-14 DIAGNOSIS — R45851 Suicidal ideations: Secondary | ICD-10-CM | POA: Diagnosis present

## 2015-07-14 DIAGNOSIS — G4700 Insomnia, unspecified: Secondary | ICD-10-CM | POA: Diagnosis present

## 2015-07-14 DIAGNOSIS — F10229 Alcohol dependence with intoxication, unspecified: Secondary | ICD-10-CM | POA: Diagnosis present

## 2015-07-14 DIAGNOSIS — F1024 Alcohol dependence with alcohol-induced mood disorder: Principal | ICD-10-CM | POA: Diagnosis present

## 2015-07-14 DIAGNOSIS — F209 Schizophrenia, unspecified: Secondary | ICD-10-CM | POA: Diagnosis present

## 2015-07-14 DIAGNOSIS — Z811 Family history of alcohol abuse and dependence: Secondary | ICD-10-CM

## 2015-07-14 DIAGNOSIS — Z590 Homelessness: Secondary | ICD-10-CM

## 2015-07-14 DIAGNOSIS — F17210 Nicotine dependence, cigarettes, uncomplicated: Secondary | ICD-10-CM | POA: Diagnosis present

## 2015-07-14 DIAGNOSIS — F419 Anxiety disorder, unspecified: Secondary | ICD-10-CM | POA: Diagnosis present

## 2015-07-14 DIAGNOSIS — R45850 Homicidal ideations: Secondary | ICD-10-CM | POA: Diagnosis not present

## 2015-07-14 DIAGNOSIS — K219 Gastro-esophageal reflux disease without esophagitis: Secondary | ICD-10-CM | POA: Diagnosis present

## 2015-07-14 DIAGNOSIS — Z841 Family history of disorders of kidney and ureter: Secondary | ICD-10-CM

## 2015-07-14 MED ORDER — LORAZEPAM 1 MG PO TABS: 1.0000 mg | ORAL_TABLET | Freq: Three times a day (TID) | ORAL | Status: DC

## 2015-07-14 MED ORDER — LOPERAMIDE HCL 2 MG PO CAPS: 2.0000 mg | ORAL_CAPSULE | ORAL | Status: AC | PRN

## 2015-07-14 MED ORDER — ONDANSETRON 4 MG PO TBDP: 4.0000 mg | ORAL_TABLET | Freq: Four times a day (QID) | ORAL | Status: AC | PRN

## 2015-07-14 MED ORDER — THIAMINE HCL 100 MG/ML IJ SOLN
100.0000 mg | Freq: Once | INTRAMUSCULAR | Status: DC
  Administered 2015-07-14: 100 mg via INTRAMUSCULAR
  Filled 2015-07-14: qty 2

## 2015-07-14 MED ORDER — LORAZEPAM 1 MG PO TABS: 1.0000 mg | ORAL_TABLET | Freq: Four times a day (QID) | ORAL | Status: DC | PRN

## 2015-07-14 MED ORDER — ACETAMINOPHEN 325 MG PO TABS: 650.0000 mg | ORAL_TABLET | Freq: Four times a day (QID) | ORAL | Status: DC | PRN

## 2015-07-14 MED ORDER — LORAZEPAM 1 MG PO TABS: 1.0000 mg | ORAL_TABLET | Freq: Two times a day (BID) | ORAL | Status: DC

## 2015-07-14 MED ORDER — HYDROXYZINE HCL 25 MG PO TABS
25.0000 mg | ORAL_TABLET | Freq: Four times a day (QID) | ORAL | Status: AC | PRN
  Administered 2015-07-15: 25 mg via ORAL
  Filled 2015-07-14 (×2): qty 1

## 2015-07-14 MED ORDER — LORAZEPAM 1 MG PO TABS
1.0000 mg | ORAL_TABLET | Freq: Three times a day (TID) | ORAL | Status: AC
  Administered 2015-07-16 (×3): 1 mg via ORAL
  Filled 2015-07-14 (×3): qty 1

## 2015-07-14 MED ORDER — LOPERAMIDE HCL 2 MG PO CAPS: 2.0000 mg | ORAL_CAPSULE | ORAL | Status: DC | PRN

## 2015-07-14 MED ORDER — LORAZEPAM 1 MG PO TABS: 1.0000 mg | ORAL_TABLET | Freq: Four times a day (QID) | ORAL | Status: AC | PRN

## 2015-07-14 MED ORDER — MAGNESIUM HYDROXIDE 400 MG/5ML PO SUSP: 30.0000 mL | Freq: Every day | ORAL | Status: DC | PRN

## 2015-07-14 MED ORDER — ADULT MULTIVITAMIN W/MINERALS CH
1.0000 | ORAL_TABLET | Freq: Every day | ORAL | Status: DC
  Administered 2015-07-15 – 2015-07-18 (×4): 1 via ORAL
  Filled 2015-07-14 (×6): qty 1

## 2015-07-14 MED ORDER — ADULT MULTIVITAMIN W/MINERALS CH
1.0000 | ORAL_TABLET | Freq: Every day | ORAL | Status: DC
  Administered 2015-07-14: 1 via ORAL
  Filled 2015-07-14: qty 1

## 2015-07-14 MED ORDER — VITAMIN B-1 100 MG PO TABS: 100.0000 mg | ORAL_TABLET | Freq: Every day | ORAL | Status: DC

## 2015-07-14 MED ORDER — ALUM & MAG HYDROXIDE-SIMETH 200-200-20 MG/5ML PO SUSP
30.0000 mL | ORAL | Status: DC | PRN
  Administered 2015-07-17 – 2015-07-18 (×2): 30 mL via ORAL
  Filled 2015-07-14 (×2): qty 30

## 2015-07-14 MED ORDER — LORAZEPAM 1 MG PO TABS
1.0000 mg | ORAL_TABLET | Freq: Four times a day (QID) | ORAL | Status: AC
  Administered 2015-07-14 – 2015-07-15 (×4): 1 mg via ORAL
  Filled 2015-07-14 (×4): qty 1

## 2015-07-14 MED ORDER — LORAZEPAM 1 MG PO TABS
1.0000 mg | ORAL_TABLET | Freq: Two times a day (BID) | ORAL | Status: AC
  Administered 2015-07-17 (×2): 1 mg via ORAL
  Filled 2015-07-14 (×2): qty 1

## 2015-07-14 MED ORDER — ONDANSETRON 4 MG PO TBDP: 4.0000 mg | ORAL_TABLET | Freq: Four times a day (QID) | ORAL | Status: DC | PRN

## 2015-07-14 MED ORDER — LORAZEPAM 1 MG PO TABS: 1.0000 mg | ORAL_TABLET | Freq: Every day | ORAL | Status: DC

## 2015-07-14 MED ORDER — LORAZEPAM 1 MG PO TABS: 1.0000 mg | ORAL_TABLET | Freq: Four times a day (QID) | ORAL | Status: DC

## 2015-07-14 MED ORDER — VITAMIN B-1 100 MG PO TABS
100.0000 mg | ORAL_TABLET | Freq: Every day | ORAL | Status: DC
  Administered 2015-07-15 – 2015-07-18 (×4): 100 mg via ORAL
  Filled 2015-07-14 (×6): qty 1

## 2015-07-14 MED ORDER — THIAMINE HCL 100 MG/ML IJ SOLN: 100.0000 mg | Freq: Once | INTRAMUSCULAR | Status: DC

## 2015-07-14 MED ORDER — ALUM & MAG HYDROXIDE-SIMETH 200-200-20 MG/5ML PO SUSP
15.0000 mL | Freq: Once | ORAL | Status: AC
  Administered 2015-07-14: 15 mL via ORAL
  Filled 2015-07-14: qty 30

## 2015-07-14 MED ORDER — HYDROXYZINE HCL 25 MG PO TABS
25.0000 mg | ORAL_TABLET | Freq: Four times a day (QID) | ORAL | Status: DC | PRN
Start: 2015-07-14 — End: 2015-07-14

## 2015-07-14 MED ORDER — LORAZEPAM 1 MG PO TABS
1.0000 mg | ORAL_TABLET | Freq: Every day | ORAL | Status: AC
  Administered 2015-07-18: 1 mg via ORAL
  Filled 2015-07-14: qty 1

## 2015-07-14 NOTE — ED Notes (Signed)
Patient given a drink.  Patient currently watching TV and comfortable.  No needs at this time.

## 2015-07-14 NOTE — Progress Notes (Signed)
Patient was [facility] [place] [facility]  [facility] [place] Call report to [phone]. Patient can be transported at 7pm.  Maryelizabeth Rowanressa Shion Bluestein, MSW, Clare CharonLCSW, LCAS [facility] Triage Specialist [phone]
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
## 2015-07-14 NOTE — ED Notes (Signed)
PT HAS BEEN ACCEPTED TO THE OBS UNIT, HE CAN BE TRANSPORTED AFTER 7PM.

## 2015-07-14 NOTE — ED Notes (Addendum)
Hourly rounding completed, pt asleep, unlabored respirations noted. Vitals will be obtain when pt is awake.
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
## 2015-07-15 DIAGNOSIS — G4700 Insomnia, unspecified: Secondary | ICD-10-CM | POA: Diagnosis present

## 2015-07-15 DIAGNOSIS — Z841 Family history of disorders of kidney and ureter: Secondary | ICD-10-CM | POA: Diagnosis not present

## 2015-07-15 DIAGNOSIS — F1024 Alcohol dependence with alcohol-induced mood disorder: Secondary | ICD-10-CM | POA: Diagnosis present

## 2015-07-15 DIAGNOSIS — F1094 Alcohol use, unspecified with alcohol-induced mood disorder: Secondary | ICD-10-CM | POA: Diagnosis not present

## 2015-07-15 DIAGNOSIS — K219 Gastro-esophageal reflux disease without esophagitis: Secondary | ICD-10-CM | POA: Diagnosis present

## 2015-07-15 DIAGNOSIS — R45851 Suicidal ideations: Secondary | ICD-10-CM | POA: Diagnosis present

## 2015-07-15 DIAGNOSIS — F10229 Alcohol dependence with intoxication, unspecified: Secondary | ICD-10-CM | POA: Diagnosis present

## 2015-07-15 DIAGNOSIS — F17210 Nicotine dependence, cigarettes, uncomplicated: Secondary | ICD-10-CM | POA: Diagnosis present

## 2015-07-15 DIAGNOSIS — F209 Schizophrenia, unspecified: Secondary | ICD-10-CM | POA: Diagnosis present

## 2015-07-15 DIAGNOSIS — F419 Anxiety disorder, unspecified: Secondary | ICD-10-CM | POA: Diagnosis present

## 2015-07-15 DIAGNOSIS — Z811 Family history of alcohol abuse and dependence: Secondary | ICD-10-CM | POA: Diagnosis not present

## 2015-07-15 DIAGNOSIS — Z590 Homelessness: Secondary | ICD-10-CM | POA: Diagnosis not present

## 2015-07-15 MED ORDER — HALOPERIDOL 1 MG PO TABS
1.0000 mg | ORAL_TABLET | Freq: Two times a day (BID) | ORAL | Status: DC
  Administered 2015-07-15 – 2015-07-18 (×6): 1 mg via ORAL
  Filled 2015-07-15 (×2): qty 1
  Filled 2015-07-15: qty 14
  Filled 2015-07-15 (×5): qty 1
  Filled 2015-07-15: qty 14
  Filled 2015-07-15 (×2): qty 1

## 2015-07-15 MED ORDER — BENZTROPINE MESYLATE 0.5 MG PO TABS
0.5000 mg | ORAL_TABLET | Freq: Two times a day (BID) | ORAL | Status: DC | PRN
Start: 2015-07-15 — End: 2015-07-18
  Filled 2015-07-15: qty 14
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
## 2015-07-15 NOTE — Tx Team (Signed)
Initial Interdisciplinary Treatment Plan   PATIENT STRESSORS: Financial difficulties Medication change or noncompliance Occupational concerns Substance abuse   PATIENT STRENGTHS: Average or above average intelligence Communication skills   PROBLEM LIST: Problem List/Patient Goals Date to be addressed Date deferred Reason deferred Estimated date of resolution  Polysubstance abuse 07/15/2015     Homelessness 07/15/2015     Lack of resources and support 07/14/1025                                          DISCHARGE CRITERIA:  Improved stabilization in mood, thinking, and/or behavior Motivation to continue treatment in a less acute level of care Reduction of life-threatening or endangering symptoms to within safe limits Withdrawal symptoms are absent or subacute and managed without 24-hour nursing intervention  PRELIMINARY DISCHARGE PLAN: Attend 12-step recovery group Outpatient therapy  PATIENT/FAMIILY INVOLVEMENT: This treatment plan has been presented to and reviewed with the patient, Irwin BrakemanJack L Gotts.  The patient and family have been given the opportunity to ask questions and make suggestions.  Cranford MonBeaudry, Styles Fambro Evans 07/15/2015, 12:26 PM

## 2015-07-15 NOTE — Progress Notes (Signed)
Patient greeted in obs and brought to search room.  No issues identified on skin assessment. Clothing retrieved from locker and will be washed per patient's request. Other items remain secure. Patient endorsing passive SI however verbally contracts for safety. Patient's affect slightly anxious, mood pleasant. States his goals for admission are 1. "to get my meds straight" and 2. "to get an ID card." Patient anxious to speak with case worker. States he has no support from family or friends. Oriented to unit, emotional support and reassurance offered. He denies HI/AVH at present though states he has had hallucinations before. Patient remains safe, presently resting in bed. Level III obs in place.

## 2015-07-15 NOTE — Progress Notes (Signed)
Patient ID: Irwin BrakemanJack L Portee, male   DOB: [birth year], 60 y.o.   MRN: [id number] D: Patient cooperative reports doing well. Pt reports decreased anxiety and depressive symptoms. Pt denies any withdrawal symptom, SI/HI/AVH and pain. Pt attended evening AA group. Pt denies any needs or concerns.  A: Support and encouragement offered as needed. Medications administered as prescribed.  R: Patient remains safe and complaint with medications.

## 2015-07-15 NOTE — Progress Notes (Signed)
D: Patient irritable with minimal interaction with staff.  When asked if he was suicidal stated, "not yet."  Patient is on ativan protocol for alcohol abuse and has minimal withdrawal symptoms.  He denies HI/AVH.  [facility].  He is resting quietly in bed. A: Continue to monitor medication management and MD orders.  Safety checks completed every 15 minutes per protocol.  Offer support and encouragement as needed. R: Patient awaiting disposition.

## 2015-07-15 NOTE — Progress Notes (Signed)
Patient did attend the evening speaker AA meeting.  

## 2015-07-16 ENCOUNTER — Encounter (HOSPITAL_COMMUNITY): Payer: Self-pay | Admitting: Psychiatry

## 2015-07-16 DIAGNOSIS — F1094 Alcohol use, unspecified with alcohol-induced mood disorder: Secondary | ICD-10-CM
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
## 2015-07-16 NOTE — Tx Team (Signed)
Interdisciplinary Treatment Plan Update (Adult)  Date:  07/16/2015  Time Reviewed:  8:26 AM   Progress in Treatment: Attending groups: No. New to unit. Continuing to assess.  Participating in groups:  No. Taking medication as prescribed:  Yes. Tolerating medication:  Yes. Family/Significant othe contact made:  SPE required for this pt.  Patient understands diagnosis:  Yes. and As evidenced by:  seeking treatment for SI, HI/ depression, alcohol abuse, and for medication stabilization.  Discussing patient identified problems/goals with staff:  Yes. Medical problems stabilized or resolved:  Yes. Denies suicidal/homicidal ideation: Yes. Issues/concerns per patient self-inventory:  Other:  Discharge Plan or Barriers:  CSW assessing for appropriate referrals. Pt was [facility] to [facility] OBS unit 5x in the past and on the unit 06/09/15 and 03/12/15 for similar issues.   Reason for Continuation of Hospitalization: Depression Medication stabilization Suicidal ideation Withdrawal symptoms  Comments:  Jimmy [name] is an 60 y.o. male presenting to [place] reporting suicidal and homicidal ideations. Pt reported having a plan of taking a bottle and cutting his wrist. Pt also reported homicidal ideations towards his brothers and stated "I would hurt them with anything I can get my hands on". Pt reported multiple suicide attempts in the past and shared that he has had several psychiatric admissions. Pt reported that he is currently not receiving any mental health treatment at this time. PT is reporting some depressive symptoms and shared that everyday living is a stressor for him. Pt reported daily alcohol use and denied illicit substance use because he is unable to afford it. Diagnosis: Alcohol use disorder, severe; Major Depressive Disorder, Recurrent episode, moderate   Estimated length of stay:  3-5 days   New goal(s): to develop effective aftercare plan.   Additional Comments:  Patient and CSW  reviewed pt's identified goals and treatment plan. Patient verbalized understanding and agreed to treatment plan. CSW reviewed [facility] "Discharge Process and Patient Involvement" Form. Pt verbalized understanding of information provided and signed form.    Review of initial/current patient goals per problem list:  1. Goal(s): Patient will participate in aftercare plan  Met: No.   Target date: at discharge As evidenced by: Patient will participate within aftercare plan AEB aftercare provider and housing plan at discharge being identified.   4/24: Pt is not attending d/c planning group at this time. CSW assessing for appropriate referrals.   2. Goal (s): Patient will exhibit decreased depressive symptoms and suicidal ideations.  Met: No.    Target date: at discharge  As evidenced by: Patient will utilize self rating of depression at 3 or below and demonstrate decreased signs of depression or be deemed stable for discharge by MD.  4/24: Pt rates depression as high. Pt denies SI/HI/AVH at this time.   3. Goal(s): Patient will demonstrate decreased signs of withdrawal due to substance abuse  Met:No.   Target date:at discharge   As evidenced by: Patient will produce a CIWA/COWS score of 0, have stable vitals signs, and no symptoms of withdrawal.  4/24: Pt reports minimal withdrawals with no COWS/CIWA score. High respirations but all other vitals are stable. Goal progressing.   Attendees: Patient:   07/16/2015 8:26 AM   Family:   07/16/2015 8:26 AM   Physician:  Dr. Carlton Adam, MD 07/16/2015 8:26 AM   Nursing:   Ashok Pall RN 07/16/2015 8:26 AM   Clinical Social Worker: Maxie Better, LCSW 07/16/2015 8:26 AM   Clinical Social Worker: Erasmo Downer Drinkard LCSW 07/16/2015 8:26 AM   Other:  Gerline Legacy Nurse Case Manager 07/16/2015 8:26 AM   Other: Agustina Caroli NP  07/16/2015 8:26 AM   Other:   07/16/2015 8:26 AM   Other:  07/16/2015 8:26 AM   Other:  07/16/2015 8:26 AM   Other:   07/16/2015 8:26 AM    07/16/2015 8:26 AM    07/16/2015 8:26 AM    07/16/2015 8:26 AM    07/16/2015 8:26 AM    Scribe for Treatment Team:   Maxie Better, LCSW 07/16/2015 8:26 AM

## 2015-07-16 NOTE — Progress Notes (Signed)
Patient ID: Jimmy [name], male   DOB: October 04, 1955, 60 y.o.   MRN: [id number] D: Patient cooperative reports doing well. Pt reports decreased anxiety and depressive symptoms. Pt denies any withdrawal symptom, SI/HI/AVH and pain. Pt attended evening wrap up group. Pt denies any needs or concerns.  A: Support and encouragement offered as needed. Medications administered as prescribed.  R: Patient remains safe and complaint with medications.

## 2015-07-16 NOTE — [facility] Group Notes (Signed)
Adult Psychoeducational Group Note  Date:  07/16/2015 Time:  9:51 PM  Group Topic/Focus:  Wrap-Up Group:   The focus of this group is to help patients review their daily goal of treatment and discuss progress on daily workbooks.  Participation Level:  Minimal  Participation Quality:  Appropriate  Affect:  Appropriate  Cognitive:  Appropriate  Insight: Good  Engagement in Group:  Limited  Modes of Intervention:  Discussion  Additional Comments:  Pt rated his day a 5.  Pt stated he slept most of the day.  His goal was to "get back on medications and not run out and have to come back here".  Pt stated he was ready to go at anytime.  Caroll RancherLindsay, Mayreli Alden A 07/16/2015, 9:51 PM

## 2015-07-16 NOTE — [facility] Suicide Risk Assessment (Signed)
[facility] Admission Suicide Risk Assessment   Nursing information obtained from:    Demographic factors:    Current Mental Status:    Loss Factors:    Historical Factors:    Risk Reduction Factors:     Total Time spent with patient: 45 minutes Principal Problem: Alcohol-induced mood disorder (HCC) Diagnosis:   Patient Active Problem List   Diagnosis Date Noted  . Homicidal ideation [R45.850]   . Alcohol-induced mood disorder (HCC) [F10.94] 06/29/2015  . Severe alcohol use disorder (HCC) [F10.20] 06/24/2015  . Substance induced mood disorder (HCC) [F19.94] 06/24/2015  . Alcohol use disorder, severe, dependence (HCC) [F10.20] 06/11/2015  . 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 Fracture of bone [T14.8] 06/11/2015  . Polysubstance abuse [F19.10] 09/06/2014  . GIB (gastrointestinal bleeding) [K92.2] 09/06/2014  . Homeless [Z59.0] 09/06/2014  . GERD (gastroesophageal reflux disease) [K21.9]   . PUD (peptic ulcer disease) [K27.9]   . Gastroesophageal reflux disease without esophagitis [K21.9]   . Hypokalemia [E87.6]   . S/P alcohol detoxification [Z09] 12/01/2013  . Seizure (HCC) [R56.9] 08/24/2013  . Tobacco use disorder [F17.200] 08/19/2013   Subjective Data: see admission H and P  Continued Clinical Symptoms:  Alcohol Use Disorder Identification Test Final Score (AUDIT): 20 The "Alcohol Use Disorders Identification Test", Guidelines for Use in Primary Care, Second Edition.  World Science writer [facility]). Score between 0-7:  no or low risk or alcohol related problems. Score between 8-15:  moderate risk of alcohol related problems. Score between 16-19:  high risk of alcohol related problems. Score 20 or above:  warrants further diagnostic evaluation for alcohol dependence and treatment.   CLINICAL FACTORS:   Depression:   Comorbid alcohol abuse/dependence Alcohol/Substance  Abuse/Dependencies   Musculoskeletal: Strength & Muscle Tone: within normal limits Gait & Station: normal Patient leans: normal  Psychiatric Specialty Exam: ROS  Blood pressure 135/73, pulse 72, temperature 98.4 F (36.9 C), temperature source Oral, resp. rate 20, height  (1.727 m), weight 73.029 kg (161 lb), SpO2 99 %.Body mass index is 24.49 kg/(m^2).  General Appearance: Disheveled  Eye Solicitor::  Fair  Speech:  Clear and Coherent  Volume:  Normal  Mood:  Anxious and worried  Affect:  Restricted  Thought Process:  Coherent and Goal Directed  Orientation:  Full (Time, Place, and Person)  Thought Content:  symptoms events worries concerns  Suicidal Thoughts:  No  Homicidal Thoughts:  No  Memory:  Immediate;   Fair Recent;   Fair Remote;   Fair  Judgement:  Fair  Insight:  Present  Psychomotor Activity:  Decreased  Concentration:  Fair  Recall:  [employer] of Knowledge:Fair  Language: Fair  Akathisia:  No  Handed:  Right  AIMS (if indicated):     Assets:  Desire for Improvement  Sleep:  Number of Hours: 5.5  Cognition: WNL  ADL's:  Intact    COGNITIVE FEATURES THAT CONTRIBUTE TO RISK:  Closed-mindedness, Polarized thinking and Thought constriction (tunnel vision)    SUICIDE RISK:   Moderate:  Frequent suicidal ideation with limited intensity, and duration, some specificity in terms of plans, no associated intent, good self-control, limited dysphoria/symptomatology, some risk factors present, and identifiable protective factors, including available and accessible social support.  PLAN OF CARE: Supportive approach/coping skills Alcohol dependence; Ativan detox protocol/ work a relapse prevention  Perceptual changes: Haldol 1 mg HS Placement; explore residential treatment options  I certify that inpatient services furnished can reasonably  be expected to improve the patient's condition.   Rachael FeeLUGO,Warren Kugelman A, MD 07/16/2015, 6:48 PM
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
## 2015-07-16 NOTE — Progress Notes (Signed)
Patient isolative to room, keeps to self. Denying withdrawal symptoms and VSS. Denies pain. Rating his depression, hopelessness and anxiety all at a 5/10. Reports his goal is to speak with his counselor. Refusing groups. Medicated per orders. No prn's requested or required. Emotional support offered. Self inventory reviewed. She denies SI/HI and remains safe on level III obs.

## 2015-07-16 NOTE — [facility] Group Notes (Signed)
[facility] LCSW Group Therapy  07/16/2015 3:44 PM  Type of Therapy:  Group Therapy  Participation Level:  Did Not Attend-invited. Pt chose to remain in bed.   Summary of Progress/Problems: Today's Topic: Overcoming Obstacles. Patients identified one short term goal and potential obstacles in reaching this goal. Patients processed barriers involved in overcoming these obstacles. Patients identified steps necessary for overcoming these obstacles and explored motivation (internal and external) for facing these difficulties head on.   Smart, Ashaz Robling LCSW 07/16/2015, 3:44 PM

## 2015-07-16 NOTE — Plan of Care (Signed)
Problem: Diagnosis: Increased Risk For Suicide Attempt Goal: STG-Patient Will Attend All Groups On The Unit Outcome: Progressing Pt attended evening AA group     

## 2015-07-16 NOTE — Plan of Care (Signed)
Problem: Alteration in mood Goal: STG-Patient reports thoughts of self-harm to staff Outcome: Progressing Patient denies thoughts of self harm.  Problem: Alteration in mood & ability to function due to Goal: STG-Patient will comply with prescribed medication regimen (Patient will comply with prescribed medication regimen)  Outcome: Progressing Patient has been compliant with meds.

## 2015-07-16 NOTE — Progress Notes (Signed)
Recreation Therapy Notes  Date: 04.24.2017 Time: 9:30am Location: [address] Group Room   Group Topic: Stress Management  Goal Area(s) Addresses:  Patient will actively participate in stress management techniques presented during session.   Behavioral Response: Did not attend.   Bellamy Rubey L Kynzlee Hucker, LRT/CTRS        Marcelus Dubberly L 07/16/2015 3:40 PM 

## 2015-07-16 NOTE — [facility] Group Notes (Signed)
[facility] Note   07/16/2015 11:31 AM  Participation Quality:  Invited. DID NOT ATTEND. Pt chose to remain in bed.   Smart, Urias Sheek LCSW

## 2015-07-16 NOTE — [facility] Suicide Risk Assessment (Signed)
[facility] Admission Suicide Risk Assessment   Nursing information obtained from:    Demographic factors:    Current Mental Status:    Loss Factors:    Historical Factors:    Risk Reduction Factors:     Total Time spent with patient: 45 minutes Principal Problem: Alcohol-induced mood disorder (HCC) Diagnosis:   Patient Active Problem List   Diagnosis Date Noted  . Homicidal ideation [R45.850]   . Alcohol-induced mood disorder (HCC) [F10.94] 06/29/2015  . Severe alcohol use disorder (HCC) [F10.20] 06/24/2015  . Substance induced mood disorder (HCC) [F19.94] 06/24/2015  . Alcohol use disorder, severe, dependence (HCC) [F10.20] 06/11/2015  . 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 Fracture of bone [T14.8] 06/11/2015  . Polysubstance abuse [F19.10] 09/06/2014  . GIB (gastrointestinal bleeding) [K92.2] 09/06/2014  . Homeless [Z59.0] 09/06/2014  . GERD (gastroesophageal reflux disease) [K21.9]   . PUD (peptic ulcer disease) [K27.9]   . Gastroesophageal reflux disease without esophagitis [K21.9]   . Hypokalemia [E87.6]   . S/P alcohol detoxification [Z09] 12/01/2013  . Seizure (HCC) [R56.9] 08/24/2013  . Tobacco use disorder [F17.200] 08/19/2013   Subjective Data: 60 Y/O male who states he is here because he has taken off and does not know where he is. Admits to drinking alcohol 12 pack a day for years. Last time here in the winter. He states he tried to go to rehab, but he was not accepted. States he relapsed shortly after he left. Stays in a tent in the woods. His other brothers are there too. 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  World Science writer [facility]). Score between 0-7:  no or low risk or alcohol related problems. Score between 8-15:  moderate risk of alcohol related problems. Score between 16-19:  high risk of alcohol related problems. Score 20 or above:  warrants further diagnostic evaluation for alcohol dependence and treatment.   CLINICAL FACTORS:   Alcohol/Substance Abuse/Dependencies   Musculoskeletal: Strength & Muscle Tone: within normal limits Gait & Station: normal Patient leans: normal  Psychiatric Specialty Exam: Review of Systems  Constitutional: Negative.   HENT:       Pressure frontal  Eyes: Negative.   Respiratory: Positive for cough.        Pack a day  Cardiovascular: Negative.   Gastrointestinal: Positive for heartburn, nausea, vomiting and diarrhea.  Genitourinary: Positive for dysuria.  Musculoskeletal: Negative.   Skin: Negative.   Neurological: Positive for tremors and headaches.  Endo/Heme/Allergies: Negative.   Psychiatric/Behavioral: Positive for depression and substance abuse. The patient is nervous/anxious.     Blood pressure 135/73, pulse 72, temperature 98.4 F (36.9 C), temperature source Oral, resp. rate 20, height  (1.727 m), weight 73.029 kg (161 lb), SpO2 99 %.Body mass index is 24.49 kg/(m^2).  General Appearance: Disheveled  Eye Solicitor::  Fair  Speech:  Clear and Coherent and Slow  Volume:  Decreased  Mood:  Anxious and Depressed  Affect:  Restricted  Thought Process:  Coherent and Goal Directed  Orientation:  Full (Time, Place,  and Person)  Thought Content:  symptoms events worries concerns  Suicidal Thoughts:  No  Homicidal Thoughts:  No  Memory:  Immediate;   Fair Recent;   Fair Remote;   Fair  Judgement:  Fair  Insight:   Shallow  Psychomotor Activity:  Restlessness  Concentration:  Fair  Recall:  FiservFair  Fund of Knowledge:Fair  Language: Fair  Akathisia:  No  Handed:  Left  AIMS (if indicated):     Assets:  Desire for Improvement  Sleep:  Number of Hours: 5.5  Cognition: WNL  ADL's:  Intact    COGNITIVE FEATURES THAT CONTRIBUTE TO RISK:  Closed-mindedness, Polarized thinking and Thought constriction (tunnel vision)    SUICIDE RISK:   Mild:  Suicidal ideation of limited frequency, intensity, duration, and specificity.  There are no identifiable plans, no associated intent, mild dysphoria and related symptoms, good self-control (both objective and subjective assessment), few other risk factors, and identifiable protective factors, including available and accessible social support.  PLAN OF CARE: Supportive approach/coping skills Alcohol dependence; ativan detox protocol/work a relapse prevention plan Work with CBT/mindfulness Reassess and address other co morbidities  I certify that inpatient services furnished can reasonably be expected to improve the patient's condition.   Rachael FeeLUGO,Nels Munn A, MD 07/16/2015, 12:56 PM

## 2015-07-17 NOTE — Progress Notes (Signed)
D: Pt presents blunted in affect and neutral in mood. Pt was isolative and minimal in interaction this evening. Pt denies any active withdrawal symptoms. Pt denies any SI/HI/AVH. Writer verbally reviewed the remainder of the evening schedule. Pt had no concerns he wished for this writer to address at this time.  A: Continued support and availability as needed was extended to this pt. Staff continues to monitor pt with q5615min checks.  R: Pt receptive to treatment. Pt remains safe at this time.

## 2015-07-17 NOTE — Plan of Care (Signed)
Problem: Alteration in mood & ability to function due to Goal: STG-Patient will attend groups Outcome: Progressing Pt attended evening wrap up group     

## 2015-07-17 NOTE — Progress Notes (Signed)
D: Patient not engaged this morning.  He is minimal with staff and peers.  He denies with withdrawal symptoms; he denies SI/HI/AVH.  Patient rates his depression as a 1; hopelessness as a 3; anxiety as a 2.  His goal today is to "get an ID." A: Continue to monitor medication management and MD orders.  Safety checks completed every 15 minutes protocol.  Offer support and encouragement as needed. R: Patient is isolative and withdrawn.

## 2015-07-17 NOTE — Progress Notes (Signed)
Pt did not attend evening AA group. Pt was asleep.  

## 2015-07-17 NOTE — [facility] Group Notes (Signed)
[facility] LCSW Group Therapy  07/17/2015 12:52 PM  Type of Therapy:  Group Therapy  Participation Level:  Active  Participation Quality:  Attentive  Affect:  Appropriate  Cognitive:  Alert and Oriented  Insight:  Improving  Engagement in Therapy:  Improving  Modes of Intervention:  Confrontation, Discussion, Education, Exploration, Problem-solving, Rapport Building, Socialization and Support  Summary of Progress/Problems: [facility] Speaker came to talk about his personal journey with substance abuse and addiction. The pt processed ways by which to relate to the speaker. [facility]HA speaker provided handouts and educational information pertaining to groups and services offered by the [facility].   Smart, Jimmy Vallee  LCSW  07/17/2015, 12:52 PM

## 2015-07-17 NOTE — [facility] Group Notes (Signed)
[facility] Group Notes:  (Nursing/MHT/Case Management/Adjunct)  Date:  07/17/2015  Time:  9:59 AM  Type of Therapy:  Psychoeducational Skills  Participation Level:  Minimal  Participation Quality:  Attentive  Affect:  Blunted  Cognitive:  Appropriate  Insight:  Improving  Engagement in Group:  Engaged  Modes of Intervention:  Discussion and Education  Summary of Progress/Problems: Patient attended group and received daily workbook however did not speak during group. He did sit and listen attentively.   Marzetta BoardDopson, Shaila Gilchrest E 07/17/2015, 9:59 AM
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
## 2015-07-18 MED ORDER — HALOPERIDOL 1 MG PO TABS: 1.0000 mg | ORAL_TABLET | Freq: Two times a day (BID) | ORAL | Status: DC

## 2015-07-18 MED ORDER — BENZTROPINE MESYLATE 0.5 MG PO TABS: 0.5000 mg | ORAL_TABLET | Freq: Two times a day (BID) | ORAL | Status: DC | PRN

## 2015-07-18 NOTE — [facility] Suicide Risk Assessment (Signed)
[facility] INPATIENT:  Family/Significant Other Suicide Prevention Education  Suicide Prevention Education:  Patient Refusal for Family/Significant Other Suicide Prevention Education: The patient Jimmy [name] has refused to provide written consent for family/significant other to be provided Family/Significant Other Suicide Prevention Education during admission and/or prior to discharge.  Physician notified.  SPE completed with pt, as pt refused to consent to family contact. SPI pamphlet provided to pt and pt was encouraged to share information with support network, ask questions, and talk about any concerns relating to SPE. Pt denies access to guns/firearms and verbalized understanding of information provided. Mobile Crisis information also provided to pt.    Pt denies SI/HI and reports that he plans to return to tent shared by his brothers.   Smart, Katheryn Culliton LCSW 07/18/2015, 10:19 AM

## 2015-07-18 NOTE — Progress Notes (Signed)
  [facility] Adult Case Management Discharge Plan :  Will you be returning to the same living situation after discharge:  Yes,  home (homeless and staying in tent with his brother(s). At discharge, do you have transportation home?: Yes,  bus pass and $3 part bus (pt [place] in [place]) Do you have the ability to pay for your medications: Yes,  mental health  Release of information consent forms completed and submitted to medical records by CSW.   Patient to Follow up at: Follow-up Information    Follow up with [facility].   Why:  Walk in between 8am-9am Monday through Friday for hospital follow-up/medication management/assessment for counseling services.    Contact information:   [address] [place], [place] [address] Phone: [phone] Fax: [phone]      Next level of care provider has [facility] to [facility] Link:no  Safety Planning and Suicide Prevention discussed: Yes,  SPE completed with pt; SPI pamphlet and mobile crisis information provided to pt . he was encouraged to share this information with his support network   Have you used any form of tobacco in the last 30 days? (Cigarettes, Smokeless Tobacco, Cigars, and/or Pipes): Yes  Has patient been referred to the Quitline?: Patient refused referral  Patient has been referred for addiction treatment: Yes  Smart, Chessa Barrasso LCSW 07/18/2015, 10:27 AM

## 2015-07-18 NOTE — Tx Team (Signed)
Interdisciplinary Treatment Plan Update (Adult)  Date:  07/18/2015  Time Reviewed:  10:28 AM   Progress in Treatment: Attending groups: Yes Participating in groups:  Minimally  Taking medication as prescribed:  Yes. Tolerating medication:  Yes. Family/Significant othe contact made:  SPE completed with pt, as he declined to consent to family contact.  Patient understands diagnosis:  Yes. and As evidenced by:  seeking treatment for SI, HI/ depression, alcohol abuse, and for medication stabilization.  Discussing patient identified problems/goals with staff:  Yes. Medical problems stabilized or resolved:  Yes. Denies suicidal/homicidal ideation: Yes.Pt denies SI/HI/AVH.  Issues/concerns per patient self-inventory:  Other:  Discharge Plan or Barriers:  Pt plans to return home to tent shared by his brothers located in [place], [place]. Pt plans to follow-up at [facility]. He has been provided with [employer], [place] pamphlet/PATH pamphlet, Mental Health Association information, and AA/NA list for [employer].   Reason for Continuation of Hospitalization: none  Comments:  Jimmy [name] is an 60 y.o. male presenting to [place] reporting suicidal and homicidal ideations. Pt reported having a plan of taking a bottle and cutting his wrist. Pt also reported homicidal ideations towards his brothers and stated "I would hurt them with anything I can get my hands on". Pt reported multiple suicide attempts in the past and shared that he has had several psychiatric admissions. Pt reported that he is currently not receiving any mental health treatment at this time. PT is reporting some depressive symptoms and shared that everyday living is a stressor for him. Pt reported daily alcohol use and denied illicit substance use because he is unable to afford it. Diagnosis: Alcohol use disorder, severe; Major Depressive Disorder, Recurrent episode, moderate   Estimated length of stay:  D/c today   Additional  Comments:  Patient and CSW reviewed pt's identified goals and treatment plan. Patient verbalized understanding and agreed to treatment plan. CSW reviewed [facility] "Discharge Process and Patient Involvement" Form. Pt verbalized understanding of information provided and signed form.    Review of initial/current patient goals per problem list:  1. Goal(s): Patient will participate in aftercare plan  Met: Yes   Target date: at discharge As evidenced by: Patient will participate within aftercare plan AEB aftercare provider and housing plan at discharge being identified.   4/24: Pt is not attending d/c planning group at this time. CSW assessing for appropriate referrals.    4/26: Pt plans to return home; follow-up at [facility].   2. Goal (s): Patient will exhibit decreased depressive symptoms and suicidal ideations.  Met: Yes   Target date: at discharge  As evidenced by: Patient will utilize self rating of depression at 3 or below and demonstrate decreased signs of depression or be deemed stable for discharge by MD.  4/24: Pt rates depression as high. Pt denies SI/HI/AVH at this time.   4/26: Pt rates depression as 2/10 and presents with pleasant mood/calm affect. He denies SI/HI/AVH.   3. Goal(s): Patient will demonstrate decreased signs of withdrawal due to substance abuse  Met:Yes.   Target date:at discharge   As evidenced by: Patient will produce a CIWA/COWS score of 0, have stable vitals signs, and no symptoms of withdrawal.  4/24: Pt reports minimal withdrawals with no COWS/CIWA score. High respirations but all other vitals are stable. Goal progressing.   4/26: Pt reports no signs of withdrawal with CIWA of 0 and stable vitals.   Attendees: Patient:   07/18/2015 10:28 AM   Family:   07/18/2015  10:28 AM   Physician:  Dr. Carlton Adam, MD 07/18/2015 10:28 AM   Nursing:  Lisbeth Renshaw RN 07/18/2015 10:28 AM   Clinical Social Worker: Maxie Better, LCSW 07/18/2015 10:28 AM    Clinical Social Worker: Erasmo Downer Drinkard LCSW 07/18/2015 10:28 AM   Other:  Gerline Legacy Nurse Case Manager 07/18/2015 10:28 AM   Other: Agustina Caroli NP  07/18/2015 10:28 AM   Other:   07/18/2015 10:28 AM   Other:  07/18/2015 10:28 AM   Other:  07/18/2015 10:28 AM   Other:  07/18/2015 10:28 AM    07/18/2015 10:28 AM    07/18/2015 10:28 AM    07/18/2015 10:28 AM    07/18/2015 10:28 AM    Scribe for Treatment Team:   Maxie Better, LCSW 07/18/2015 10:28 AM

## 2015-07-18 NOTE — Progress Notes (Signed)
Recreation Therapy Notes  Date: 04.26.2017 Time: 9:30am Location: [address] Day[address]   Group Topic: Stress Management  Goal Area(s) Addresses:  Patient will actively participate in stress management techniques presented during session.   Behavioral Response: Did not attend.    Auryn Paige L Jasey Cortez, LRT/CTRS        Chad Donoghue L 07/18/2015 2:22 PM 

## 2015-07-18 NOTE — [facility] Suicide Risk Assessment (Signed)
[facility] Discharge Suicide Risk Assessment   Principal Problem: Alcohol-induced mood disorder [facility]) Discharge Diagnoses:  Patient Active Problem List   Diagnosis Date Noted  . Homicidal ideation [R45.850]   . Alcohol-induced mood disorder (HCC) [F10.94] 06/29/2015  . Severe alcohol use disorder (HCC) [F10.20] 06/24/2015  . Substance induced mood disorder (HCC) [F19.94] 06/24/2015  . Alcohol use disorder, severe, dependence (HCC) [F10.20] 06/11/2015  . 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 Fracture of bone [T14.8] 06/11/2015  . Polysubstance abuse [F19.10] 09/06/2014  . GIB (gastrointestinal bleeding) [K92.2] 09/06/2014  . Homeless [Z59.0] 09/06/2014  . GERD (gastroesophageal reflux disease) [K21.9]   . PUD (peptic ulcer disease) [K27.9]   . Gastroesophageal reflux disease without esophagitis [K21.9]   . Hypokalemia [E87.6]   . S/P alcohol detoxification [Z09] 12/01/2013  . Seizure (HCC) [R56.9] 08/24/2013  . Tobacco use disorder [F17.200] 08/19/2013    Total Time spent with patient: 15 minutes  Musculoskeletal: Strength & Muscle Tone: within normal limits Gait & Station: normal Patient leans: normal  Psychiatric Specialty Exam: Review of Systems  Constitutional: Negative.   HENT: Negative.   Eyes: Negative.   Respiratory: Negative.   Cardiovascular: Negative.   Gastrointestinal: Negative.   Genitourinary: Negative.   Musculoskeletal: Negative.   Skin: Negative.   Neurological: Negative.   Endo/Heme/Allergies: Negative.   Psychiatric/Behavioral: Positive for substance abuse.    Blood pressure 124/80, pulse 94, temperature 97.7 F (36.5 C), temperature source Oral, resp. rate 16, height  (1.727 m), weight 73.029 kg (161 lb), SpO2 99 %.Body mass index is 24.49 kg/(m^2).  General Appearance: Fairly Groomed  Patent attorney::  Fair  Speech:  Clear and Coherent409  Volume:  Normal  Mood:   Euthymic  Affect:  Restricted  Thought Process:  Coherent and Goal Directed  Orientation:  Full (Time, Place, and Person)  Thought Content:  plans as he moves on, relapse prevention plan  Suicidal Thoughts:  No  Homicidal Thoughts:  No  Memory:  Immediate;   Fair Recent;   Fair Remote;   Fair  Judgement:  Fair  Insight:  Present  Psychomotor Activity:  Restlessness  Concentration:  Fair  Recall:  [employer] of Knowledge:Fair  Language: Fair  Akathisia:  No  Handed:  Right  AIMS (if indicated):     Assets:  Desire for Improvement  Sleep:  Number of Hours: 6  Cognition: WNL  ADL's:  Intact  In full contact with reality. There are no active S/S of withdrawal. There are no active SI plans or intent. He declined a referral to a residential treatment program. He is going back to his tent around his brothers. States he hopes to be able to abstain Mental Status Per Nursing Assessment::   On Admission:     Demographic Factors:  Male and Caucasian  Loss Factors: Decline in physical health  Historical Factors: none identified  Risk Reduction Factors:   Positive social support  Continued Clinical Symptoms:  Alcohol/Substance Abuse/Dependencies  Cognitive Features That Contribute To Risk:  None    Suicide Risk:  Minimal: No identifiable suicidal ideation.  Patients presenting with no risk factors but with morbid ruminations; may be classified as minimal risk based on the severity of the depressive symptoms  Follow-up Information    Follow up with [facility].   Why:  Walk in between 8am-9am Monday through Friday for hospital follow-up/medication management/assessment for counseling services.    Contact information:   [address] [place], [place] [address] Phone: [phone] Fax: [phone]      Plan Of Care/Follow-up recommendations:  Activity:  as tolerated Diet:  regular Follow up as above Soledad Budreau A, MD 07/18/2015, 10:49 AM

## 2015-07-18 NOTE — Progress Notes (Signed)
D: Patient discharged home per MD order.  [facility].  Patient received prescriptions and medication samples.  Reviewed AVS/discharge instructions with patient, along with follow up appointment.  Patient indicated understanding.  Patient denies SI/HI/AVH.  Patient left ambulatory with 2 bus passes.
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
## 2015-07-27 ENCOUNTER — Emergency Department (HOSPITAL_COMMUNITY)
Admission: EM | Admit: 2015-07-27 | Discharge: 2015-07-28 | Disposition: A | Discharge status: Home / Self Care | Payer: Self-pay | Attending: Emergency Medicine | Admitting: Emergency Medicine

## 2015-07-27 DIAGNOSIS — F209 Schizophrenia, unspecified: Secondary | ICD-10-CM | POA: Insufficient documentation

## 2015-07-27 DIAGNOSIS — F1020 Alcohol dependence, uncomplicated: Secondary | ICD-10-CM

## 2015-07-27 DIAGNOSIS — F17210 Nicotine dependence, cigarettes, uncomplicated: Secondary | ICD-10-CM | POA: Insufficient documentation

## 2015-07-27 DIAGNOSIS — F1910 Other psychoactive substance abuse, uncomplicated: Secondary | ICD-10-CM | POA: Insufficient documentation

## 2015-07-27 DIAGNOSIS — Z79899 Other long term (current) drug therapy: Secondary | ICD-10-CM | POA: Insufficient documentation

## 2015-07-27 DIAGNOSIS — F1094 Alcohol use, unspecified with alcohol-induced mood disorder: Secondary | ICD-10-CM | POA: Diagnosis present

## 2015-07-27 DIAGNOSIS — K219 Gastro-esophageal reflux disease without esophagitis: Secondary | ICD-10-CM | POA: Insufficient documentation

## 2015-07-27 DIAGNOSIS — F1099 Alcohol use, unspecified with unspecified alcohol-induced disorder: Secondary | ICD-10-CM | POA: Insufficient documentation

## 2015-07-27 DIAGNOSIS — F319 Bipolar disorder, unspecified: Secondary | ICD-10-CM | POA: Insufficient documentation

## 2015-07-27 DIAGNOSIS — R45851 Suicidal ideations: Secondary | ICD-10-CM

## 2015-07-28 ENCOUNTER — Encounter (HOSPITAL_COMMUNITY): Payer: Self-pay | Admitting: *Deleted

## 2015-07-28 DIAGNOSIS — F1094 Alcohol use, unspecified with alcohol-induced mood disorder: Secondary | ICD-10-CM

## 2015-07-28 LAB — COMPREHENSIVE METABOLIC PANEL
ALBUMIN: 4.1 g/dL (ref 3.5–5.0)
ALT: 16 U/L — ABNORMAL LOW (ref 17–63)
ANION GAP: 9 (ref 5–15)
AST: 26 U/L (ref 15–41)
Alkaline Phosphatase: 79 U/L (ref 38–126)
BUN: 7 mg/dL (ref 6–20)
CHLORIDE: 106 mmol/L (ref 101–111)
CO2: 22 mmol/L (ref 22–32)
Calcium: 8.5 mg/dL — ABNORMAL LOW (ref 8.9–10.3)
Creatinine, Ser: 0.46 mg/dL — ABNORMAL LOW (ref 0.61–1.24)
GFR calc Af Amer: >60 mL/min (ref >60)
GLUCOSE: 88 mg/dL (ref 65–99)
POTASSIUM: 4.1 mmol/L (ref 3.5–5.1)
Sodium: 137 mmol/L (ref 135–145)
TOTAL PROTEIN: 8.1 g/dL (ref 6.5–8.1)
Total Bilirubin: 0.4 mg/dL (ref 0.3–1.2)

## 2015-07-28 LAB — CBC
HCT: 38.6 % — ABNORMAL LOW (ref 39.0–52.0)
Hemoglobin: 13.6 g/dL (ref 13.0–17.0)
MCH: 33.3 pg (ref 26.0–34.0)
MCHC: 35.2 g/dL (ref 30.0–36.0)
MCV: 94.4 fL (ref 78.0–100.0)
PLATELETS: 376 10*3/uL (ref 150–400)
RBC: 4.09 MIL/uL — AB (ref 4.22–5.81)
RDW: 13.2 % (ref 11.5–15.5)
WBC: 7.9 10*3/uL (ref 4.0–10.5)

## 2015-07-28 LAB — SALICYLATE LEVEL: Salicylate Lvl: <4 mg/dL (ref 2.8–30.0)

## 2015-07-28 LAB — RAPID URINE DRUG SCREEN, HOSP PERFORMED
AMPHETAMINES: NOT DETECTED
Barbiturates: NOT DETECTED
Benzodiazepines: NOT DETECTED
Cocaine: NOT DETECTED
OPIATES: NOT DETECTED
Tetrahydrocannabinol: NOT DETECTED

## 2015-07-28 LAB — ETHANOL: ALCOHOL ETHYL (B): 214 mg/dL — AB (ref <5)

## 2015-07-28 LAB — ACETAMINOPHEN LEVEL

## 2015-07-28 MED ORDER — LORAZEPAM 1 MG PO TABS: 0.0000 mg | ORAL_TABLET | Freq: Two times a day (BID) | ORAL | Status: DC

## 2015-07-28 MED ORDER — THIAMINE HCL 100 MG/ML IJ SOLN: 100.0000 mg | Freq: Every day | INTRAMUSCULAR | Status: DC

## 2015-07-28 MED ORDER — BENZTROPINE MESYLATE 1 MG PO TABS: 0.5000 mg | ORAL_TABLET | Freq: Two times a day (BID) | ORAL | Status: DC | PRN

## 2015-07-28 MED ORDER — ZOLPIDEM TARTRATE 5 MG PO TABS: 5.0000 mg | ORAL_TABLET | Freq: Every evening | ORAL | Status: DC | PRN

## 2015-07-28 MED ORDER — ALUM & MAG HYDROXIDE-SIMETH 200-200-20 MG/5ML PO SUSP: 30.0000 mL | ORAL | Status: DC | PRN

## 2015-07-28 MED ORDER — IBUPROFEN 200 MG PO TABS: 600.0000 mg | ORAL_TABLET | Freq: Three times a day (TID) | ORAL | Status: DC | PRN

## 2015-07-28 MED ORDER — VITAMIN B-1 100 MG PO TABS
100.0000 mg | ORAL_TABLET | Freq: Every day | ORAL | Status: DC
  Administered 2015-07-28: 100 mg via ORAL
  Filled 2015-07-28: qty 1

## 2015-07-28 MED ORDER — HALOPERIDOL 1 MG PO TABS
1.0000 mg | ORAL_TABLET | Freq: Two times a day (BID) | ORAL | Status: DC
  Administered 2015-07-28 (×2): 1 mg via ORAL
  Filled 2015-07-28 (×2): qty 1

## 2015-07-28 MED ORDER — NICOTINE 21 MG/24HR TD PT24
21.0000 mg | MEDICATED_PATCH | Freq: Every day | TRANSDERMAL | Status: DC
  Administered 2015-07-28: 21 mg via TRANSDERMAL
  Filled 2015-07-28: qty 1

## 2015-07-28 MED ORDER — LORAZEPAM 1 MG PO TABS
0.0000 mg | ORAL_TABLET | Freq: Four times a day (QID) | ORAL | Status: DC
  Administered 2015-07-28: 1 mg via ORAL
  Filled 2015-07-28: qty 1

## 2015-07-28 MED ORDER — ONDANSETRON HCL 4 MG PO TABS: 4.0000 mg | ORAL_TABLET | Freq: Three times a day (TID) | ORAL | Status: DC | PRN

## 2015-07-28 NOTE — ED Notes (Signed)
PA at bedside.

## 2015-07-28 NOTE — ED Notes (Signed)
Patient discharged with resources.  Denies thoughts of harm to self or others.  Denies auditory or visual hallucinations.  He was given a bus pass and a copy of his AVS.

## 2015-07-28 NOTE — ED Notes (Signed)
[facility] Expected date:  Expected time:  Means of arrival:  Comments: tr4

## 2015-07-28 NOTE — ED Notes (Signed)
Pt has been wanded by security and changed into scrubs 

## 2015-07-28 NOTE — ED Notes (Signed)
Patient received calm and visible with mild discomfort. Patient denies SI. HI, pain, but endorses visual hallucinations. Patient showing mild tremors and reports last drink was some time 5/5 PM. Patient fed meal trey and medication provided. Patient contracted for safety. No other complaints, stable, in no acute distress. Q15 minute rounds and monitoring via security cameras continue for safety.

## 2015-07-28 NOTE — [facility] Suicide Risk Assessment (Signed)
Suicide Risk Assessment  Discharge Assessment   [facility]   Principal Problem: Alcohol-induced mood disorder [facility](HCC) Discharge Diagnoses:  Patient Active Problem List   Diagnosis Date Noted  . Alcohol-induced mood disorder (HCC) [F10.94] 06/29/2015    Priority: High  . Alcohol use disorder, severe, dependence (HCC) [F10.20] 06/11/2015    Priority: High  . Homicidal ideation [R45.850]   . Severe alcohol use disorder (HCC) [F10.20] 06/24/2015  . Substance induced mood disorder (HCC) [F19.94] 06/24/2015  . 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 Fracture of bone [T14.8] 06/11/2015  . Polysubstance abuse [F19.10] 09/06/2014  . GIB (gastrointestinal bleeding) [K92.2] 09/06/2014  . Homeless [Z59.0] 09/06/2014  . GERD (gastroesophageal reflux disease) [K21.9]   . PUD (peptic ulcer disease) [K27.9]   . Gastroesophageal reflux disease without esophagitis [K21.9]   . Hypokalemia [E87.6]   . S/P alcohol detoxification [Z09] 12/01/2013  . Seizure (HCC) [R56.9] 08/24/2013  . Tobacco use disorder [F17.200] 08/19/2013    Total Time spent with patient: 45 minutes  Musculoskeletal: Strength & Muscle Tone: within normal limits Gait & Station: normal Patient leans: N/A  Psychiatric Specialty Exam: Review of Systems  Constitutional: Negative.   HENT: Negative.   Eyes: Negative.   Respiratory: Negative.   Cardiovascular: Negative.   Gastrointestinal: Negative.   Genitourinary: Negative.   Musculoskeletal: Negative.   Skin: Negative.   Neurological: Negative.   Endo/Heme/Allergies: Negative.   Psychiatric/Behavioral: Positive for substance abuse.    Blood pressure 126/68, pulse 100, temperature 98.1 F (36.7 C), temperature source Oral, resp. rate 20, SpO2 99 %.There is no weight on file to calculate BMI.  General Appearance: Disheveled  Eye Contact::  Good  Speech:  Normal  Rate  Volume:  Normal  Mood:  Irritable  Affect:  Congruent  Thought Process:  Coherent  Orientation:  Full (Time, Place, and Person)  Thought Content:  WDL  Suicidal Thoughts:  No  Homicidal Thoughts:  No  Memory:  Immediate;   Good Recent;   Good Remote;   Good  Judgement:  Fair  Insight:  Fair  Psychomotor Activity:  Normal  Concentration:  Good  Recall:  Good  Fund of Knowledge:Good  Language: Good  Akathisia:  No  Handed:  Right  AIMS (if indicated):     Assets:  Leisure Time Physical Health Resilience  ADL's:  Intact  Cognition: WNL  Sleep:       Mental Status Per Nursing Assessment::   On Admission:   alcohol intoxication with suicidal ideations  Demographic Factors:  Male  Loss Factors: NA  Historical Factors: NA  Risk Reduction Factors:   NA  Continued Clinical Symptoms:  Substance abuse  Cognitive Features That Contribute To Risk:  None    Suicide Risk:  Minimal: No identifiable suicidal ideation.  Patients presenting with no risk factors but with morbid ruminations; may be classified as minimal risk based on the severity of the depressive symptoms    Plan Of Care/Follow-up recommendations:  Activity:  as tolerated Diet:  heart health diet  Nanine MeansLORD, JAMISON, NP 07/28/2015, 1:33 PM
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
## 2015-07-28 NOTE — ED Notes (Signed)
Pt says that he is hearing voices, telling him "everything", reports voices are telling him to hurt himself and other people. Pt says he would cut his wrist with a piece of glass or a knife "whatever I could get a hold of", wants to hurt no one else specifically. Suppose to be on haldol but ran out 1 week ago.
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
## 2015-08-16 ENCOUNTER — Encounter (HOSPITAL_COMMUNITY): Payer: Self-pay | Admitting: Emergency Medicine

## 2015-08-16 ENCOUNTER — Emergency Department (HOSPITAL_COMMUNITY)
Admission: EM | Admit: 2015-08-16 | Discharge: 2015-08-17 | Disposition: A | Discharge status: Home / Self Care | Payer: Self-pay | Attending: Emergency Medicine | Admitting: Emergency Medicine

## 2015-08-16 DIAGNOSIS — F1094 Alcohol use, unspecified with alcohol-induced mood disorder: Secondary | ICD-10-CM

## 2015-08-16 DIAGNOSIS — Z79899 Other long term (current) drug therapy: Secondary | ICD-10-CM | POA: Insufficient documentation

## 2015-08-16 DIAGNOSIS — F1014 Alcohol abuse with alcohol-induced mood disorder: Secondary | ICD-10-CM | POA: Insufficient documentation

## 2015-08-16 DIAGNOSIS — F17210 Nicotine dependence, cigarettes, uncomplicated: Secondary | ICD-10-CM | POA: Insufficient documentation

## 2015-08-16 DIAGNOSIS — F209 Schizophrenia, unspecified: Secondary | ICD-10-CM | POA: Insufficient documentation

## 2015-08-16 DIAGNOSIS — F319 Bipolar disorder, unspecified: Secondary | ICD-10-CM | POA: Insufficient documentation

## 2015-08-16 NOTE — ED Notes (Signed)
Patient presents from [facility] via EMS for ETOH, patient requesting "help with ETOH abuse". Endorses ETOH today. Ambulatory upon arrival.  Last VS: 132/78, 82hr, 17resp.

## 2015-08-16 NOTE — ED Notes (Signed)
Patient given Malaw[place]urkey sandwich and water. Consumed 100% of same.
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
## 2015-08-16 NOTE — ED Notes (Signed)
Bed: [facility] Expected date:  Expected time:  Means of arrival:  Comments: EMS/ETOH 

## 2015-08-17 NOTE — BH Assessment (Addendum)
Assessment completed. Consulted with Maryjean Mornharles kober, PA-C who recommends patient be observed overnight and referred out to facilities that will offer treatment.   Davina PokeJoVea Kyndall Chaplin, LCSW Therapeutic Triage Specialist  Health 08/17/2015 2:07 AM

## 2015-08-17 NOTE — Discharge Instructions (Signed)
Alcohol Use Disorder °Alcohol use disorder is a mental disorder. It is not a one-time incident of heavy drinking. Alcohol use disorder is the excessive and uncontrollable use of alcohol over time that leads to problems with functioning in one or more areas of daily living. People with this disorder risk harming themselves and others when they drink to excess. Alcohol use disorder also can cause other mental disorders, such as mood and anxiety disorders, and serious physical problems. People with alcohol use disorder often misuse other drugs.  °Alcohol use disorder is common and widespread. Some people with this disorder drink alcohol to cope with or escape from negative life events. Others drink to relieve chronic pain or symptoms of mental illness. People with a family history of alcohol use disorder are at higher risk of losing control and using alcohol to excess.  °Drinking too much alcohol can cause injury, accidents, and health problems. One drink can be too much when you are: °· Working. °· Pregnant or breastfeeding. °· Taking medicines. Ask your doctor. °· Driving or planning to drive. °SYMPTOMS  °Signs and symptoms of alcohol use disorder may include the following:  °· Consumption of alcohol in larger amounts or over a longer period of time than intended. °· Multiple unsuccessful attempts to cut down or control alcohol use.   °· A great deal of time spent obtaining alcohol, using alcohol, or recovering from the effects of alcohol (hangover). °· A strong desire or urge to use alcohol (cravings).   °· Continued use of alcohol despite problems at work, school, or home because of alcohol use.   °· Continued use of alcohol despite problems in relationships because of alcohol use. °· Continued use of alcohol in situations when it is physically hazardous, such as driving a car. °· Continued use of alcohol despite awareness of a physical or psychological problem that is likely related to alcohol use. Physical  problems related to alcohol use can involve the brain, heart, liver, stomach, and intestines. Psychological problems related to alcohol use include intoxication, depression, anxiety, psychosis, delirium, and dementia.   °· The need for increased amounts of alcohol to achieve the same desired effect, or a decreased effect from the consumption of the same amount of alcohol (tolerance). °· Withdrawal symptoms upon reducing or stopping alcohol use, or alcohol use to reduce or avoid withdrawal symptoms. Withdrawal symptoms include: °¨ Racing heart. °¨ Hand tremor. °¨ Difficulty sleeping. °¨ Nausea. °¨ Vomiting. °¨ Hallucinations. °¨ Restlessness. °¨ Seizures. °DIAGNOSIS °Alcohol use disorder is diagnosed through an assessment by your health care provider. Your health care provider may start by asking three or four questions to screen for excessive or problematic alcohol use. To confirm a diagnosis of alcohol use disorder, at least two symptoms must be present within a 12-month period. The severity of alcohol use disorder depends on the number of symptoms: °· Mild--two or three. °· Moderate--four or five. °· Severe--six or more. °Your health care provider may perform a physical exam or use results from lab tests to see if you have physical problems resulting from alcohol use. Your health care provider may refer you to a mental health professional for evaluation. °TREATMENT  °Some people with alcohol use disorder are able to reduce their alcohol use to low-risk levels. Some people with alcohol use disorder need to quit drinking alcohol. When necessary, mental health professionals with specialized training in substance use treatment can help. Your health care provider can help you decide how severe your alcohol use disorder is and what type of treatment you need.   The following forms of treatment are available:  °· Detoxification. Detoxification involves the use of prescription medicines to prevent alcohol withdrawal  symptoms in the first week after quitting. This is important for people with a history of symptoms of withdrawal and for heavy drinkers who are likely to have withdrawal symptoms. Alcohol withdrawal can be dangerous and, in severe cases, cause death. Detoxification is usually provided in a hospital or in-patient substance use treatment facility. °· Counseling or talk therapy. Talk therapy is provided by substance use treatment counselors. It addresses the reasons people use alcohol and ways to keep them from drinking again. The goals of talk therapy are to help people with alcohol use disorder find healthy activities and ways to cope with life stress, to identify and avoid triggers for alcohol use, and to handle cravings, which can cause relapse. °· Medicines. Different medicines can help treat alcohol use disorder through the following actions: °¨ Decrease alcohol cravings. °¨ Decrease the positive reward response felt from alcohol use. °¨ Produce an uncomfortable physical reaction when alcohol is used (aversion therapy). °· Support groups. Support groups are run by people who have quit drinking. They provide emotional support, advice, and guidance. °These forms of treatment are often combined. Some people with alcohol use disorder benefit from intensive combination treatment provided by specialized substance use treatment centers. Both inpatient and outpatient treatment programs are available. °  °This information is not intended to replace advice given to you by your health care provider. Make sure you discuss any questions you have with your health care provider. °  °Document Released: 04/17/2004 Document Revised: 03/31/2014 Document Reviewed: 06/17/2012 °Elsevier Interactive Patient Education ©2016 [employer]vier Inc. °Community Resource Guide Outpatient Counseling/Substance Abuse Adult °The United Way’s “211” is a great source of information about community services available.  Access by dialing 2-1-1 from anywhere  in [place], or by website -  www.nc211.org.  ° °Other Local Resources (Updated 03/2015) ° °Crisis Hotlines °  °Services  ° °  °Area Served  °[facility] • Crisis Hotline, available 24 hours a day, 7 days a week: 800-939-5911 [place], [place]  ° [facility] • Crisis Hotline, available 24 hours a day, 7 days a week: 866-275-9552 [place], [place]  °[facility] • Suicide Prevention Hotline, available 24 hours a day, 7 days a week: 800-273-8255 [place], [place]  °[facility] ° • Crisis Hotline, available 24 hours a day, 7 days a week: 336-676-6840 [place], [place] °  °[facility] • Crisis Hotline, available 24 hours a day, 7 days a week: 800-256-2452 All °  °[facility] • Crisis Hotline, available 24 hours a day, 7 days a week: 877-626-1772 All  ° °Other Local Resources (Updated 03/2015) ° °Outpatient Counseling/ Substance Abuse Programs  °Services  ° °  °Address and Phone Number  °[facility] ° • Options include Individual counseling, group counseling, intensive outpatient program (several hours a day, several days a week) °• Offers depression assessments °• Provides methadone maintenance program 336-333-6860 °[address], [address] °[place], [place] [address] °  °[facility] ° • Offers partial hospitalization/day treatment and DUI/DWI programs °• Accepts Medicare, private insurance 336-299-4655 °[address], [address] °[place], [place] [address]  °[facility] ° ° • Services include intensive outpatient program (several hours a day, several days a week), outpatient treatment, DUI/DWI services, family education °• Also has some services specifically for Veterans °• Offers transitional housing  336-886-5594 °[address] °[place],   [place] [address] °  °  °[facility] • Accepts Medicare, private pay, and private insurance 336-272-0855 °[address] °[place], [place] [address]  °[facility] • Services include individual counseling, substance abuse intensive outpatient program (several hours a day, several days a week), day treatment °• Accepts Medicare, Medicaid, private insurance [phone] °[address] °[place], [place] [address]  °[facility] ° • Offers substance abuse intensive outpatient program (several hours a day, several days a week), partial hospitalization program 336-832-9800 °[address] °[place], [place] [address] ° °336-349-4454 °[address] °[place], [place] [address] ° °336-386-3795 °[address] °[place], [place] [address] ° °[phone] °[address] °[place], [place] [address]  °[facility] • Individual counseling only °• Accepts private insurance only 336-292-1510 °[address] °[place], [place] [address]  °[facility] • Methadone maintenance program 800-805-6989 °[address] °[place], [place] [address]  °[facility] • Walk-In Clinic providing substance abuse and mental health counseling °• Accepts Medicaid, Medicare, private insurance °• Offers sliding scale for uninsured 336-342-8316 °[address] °[place], [place]   °[facility] • Offers individual counseling, and intensive in-home services 336-347-7415 °[address] °[place], [place] [address]  °[facility] • Offers individual counseling, family counseling, group therapy, domestic violence counseling, consumer credit counseling °• Accepts Medicare, Medicaid, private insurance °• Offers sliding scale for uninsured 336-387-6161 °[address] °[place], [place] [address] ° °336-889-6161 °[facility], [address] °[place], [place] [address]  °[facility] • Offers individual, family and group counseling °• 3 locations - [place], [place], and [place] ° 336-899-8800 ° °[address] °[place], [place] [address] ° °[address] °[place], [place] [address] ° °[address] °[place], [place] [address]  °[facility]  ° • Offers psychiatric assessment, 8-week Intensive Outpatient Program (several hours a day, several times a week, daytime or evenings), early recovery group, family Program, medication management °• Private pay or private insurance only [phone] -[phone], or  °800-659-3381 °[address] °[place], [place] [address]  °[facility] • Offers individual, couples and family counseling °• Accepts Medicaid, private insurance, and sliding scale for uninsured 336-542-2076 °[address] °[place], [place] [address]  °David Fuller, MD • Individual counseling °• Private insurance 336-852-4051 °[address] °[place], [place] [address]  °[facility] ° • Offers assessment, substance abuse treatment, and behavioral health treatment [phone] °[address] °[place], [place] [address]  °[facility] • Individual counseling °• Accepts private insurance 336-272-1972 °[address] °[place], [place] [address]  °[facility] • Individual counseling °• Accepts Medicare, private insurance [phone] °[address] °[place], [place] [address]  °[facility]  ° • Offers intensive outpatient program (several hours a day, several times a week) °• Private pay, private insurance [phone] °[address] °[place], [place]  °[facility] • Individual counseling °• Medicare, private insurance [phone] °[address] °[place], [place] [address]  °[facility]  ° • Offers intensive outpatient program (several hours a day, several times a week) and partial hospitalization program 336-794-3550 °[address] °[place], [place] [address]  °Parrish McKinney, MD • Individual counseling 336-282-1251 °[address] °,  [address]   °[facility] • Offers Christian counseling to individuals, couples, and families °• Accepts   Medicare and private insurance; offers sliding scale for uninsured 336-288-1484 °[address] °[place], [place] [address]  °[facility] • Christian counseling 336-542-2060 °[address] °[place], [place] [address]  °[facility] ° • Offers crisis counseling, individual counseling, group therapy, in-home therapy, domestic violence services, day treatment, DWI services, Community Support Team (CST), Assertive Community Treatment Team (ACTT), substance abuse Intensive Outpatient Program (several hours a day, several times a week) °• 2 locations - [facility] and [place] [phone] °[address] °[place], [place] [address] ° °336-694-1777 °[address] °[place], [place] [address]  °[facility]  ° ° • Individual counseling and group therapy °• Accepts private insurance, Medicare, Medicaid 336-379-7146 °[address] °[place], [place]  °[facility] • Offers individual and family counseling °• Offers LGBTQ services °• Accepts private insurance and private pay 336-288-9190 °[address] °[place], [place] [address]  °[facility]  ° • Offers individual counseling, group therapy, and outpatient detox °• Accepts private insurance [phone] °[address] °[place], [place]  °[facility] • Individual counseling °• Accepts Medicare, private insurance [phone] °[address] °[place], [place] [address]  °[facility] • Individual counseling °• Accepts Medicare, private insurance 336-570-0104 °[address] °[place],  [address]  °[facility] ° • Offers substance abuse Intensive Outpatient Program (several hours a day, several times a week) [phone], or °[phone] °[place],   ° °
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
## 2015-08-17 NOTE — ED Notes (Signed)
Pt alert and oriented and requesting to go outside and smoke. Walked to bathroom by hisself with no issues

## 2015-11-07 ENCOUNTER — Encounter (HOSPITAL_COMMUNITY): Payer: Self-pay

## 2015-11-07 ENCOUNTER — Emergency Department (HOSPITAL_COMMUNITY)
Admission: EM | Admit: 2015-11-07 | Discharge: 2015-11-08 | Disposition: A | Discharge status: Home / Self Care | Payer: Self-pay | Attending: Emergency Medicine | Admitting: Emergency Medicine

## 2015-11-07 DIAGNOSIS — F10920 Alcohol use, unspecified with intoxication, uncomplicated: Secondary | ICD-10-CM

## 2015-11-07 DIAGNOSIS — F10120 Alcohol abuse with intoxication, uncomplicated: Secondary | ICD-10-CM | POA: Insufficient documentation

## 2015-11-07 DIAGNOSIS — F1290 Cannabis use, unspecified, uncomplicated: Secondary | ICD-10-CM | POA: Insufficient documentation

## 2015-11-07 DIAGNOSIS — F1010 Alcohol abuse, uncomplicated: Secondary | ICD-10-CM

## 2015-11-07 DIAGNOSIS — F17210 Nicotine dependence, cigarettes, uncomplicated: Secondary | ICD-10-CM | POA: Insufficient documentation

## 2015-11-07 NOTE — ED Notes (Signed)
No respiratory or acute distress noted resting in bed with eyes closed. 
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
## 2015-11-07 NOTE — ED Triage Notes (Signed)
Wants detox for tonight from alcohol and also complains of shortness of breath.

## 2015-11-07 NOTE — Progress Notes (Signed)
Patient noted to have been seen in the ED 9 times within the last six months. EDCM went to speak to patient at bedside. Patient asleep at this time.  EDCM did not disturb patient. Patient's address listed as [address] Which is the address of the [facility]. Patient may receive assistance with medications, receive a medical examination and receive assistance with housing and food resources at the [facility]. EDCM left the follow ing at bedside:   [facility], informed patient of services there and walk in times.  EDCM also provided patient with list of pcps who accept self pay patients, list of discount pharmacies and websites needymeds.org and GoodRX.com for medication assistance, phone number to inquire about the orange card, phone number to inquire about Medicaid, phone number to inquire about the Affordable Care Act, financial resources in the community such as local churches, salvation army, urban ministries, and dental assistance for uninsured patients. .  No further EDCM needs at this time.

## 2015-11-07 NOTE — ED Notes (Signed)
[facility] Expected date:  Expected time:  Means of arrival:  Comments: 58M/etoh/SOB

## 2015-11-07 NOTE — ED Notes (Signed)
Food and drink given alert and oriented no respiratory or acute distress noted.

## 2015-11-08 NOTE — ED Notes (Signed)
No respiratory or acute distress noted resting in bed with eyes closed. 

## 2015-11-09 ENCOUNTER — Encounter (HOSPITAL_COMMUNITY): Payer: Self-pay | Admitting: Emergency Medicine

## 2015-11-09 DIAGNOSIS — F17210 Nicotine dependence, cigarettes, uncomplicated: Secondary | ICD-10-CM | POA: Insufficient documentation

## 2015-11-09 DIAGNOSIS — F1020 Alcohol dependence, uncomplicated: Secondary | ICD-10-CM | POA: Insufficient documentation

## 2015-11-09 DIAGNOSIS — F1290 Cannabis use, unspecified, uncomplicated: Secondary | ICD-10-CM | POA: Insufficient documentation

## 2015-11-09 NOTE — ED Triage Notes (Signed)
Pt c/o being "sick" all day, pt states he has been drinking etoh all day with 1 episode of emesis per patient. Pt requesting detox. Steady gait, A & O

## 2015-11-10 ENCOUNTER — Emergency Department (HOSPITAL_COMMUNITY)
Admission: EM | Admit: 2015-11-10 | Discharge: 2015-11-10 | Disposition: A | Discharge status: Home / Self Care | Payer: Self-pay | Attending: Emergency Medicine | Admitting: Emergency Medicine

## 2015-11-10 DIAGNOSIS — IMO0002 Reserved for concepts with insufficient information to code with codable children: Secondary | ICD-10-CM
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
## 2015-12-13 ENCOUNTER — Emergency Department (HOSPITAL_COMMUNITY): Payer: Self-pay

## 2015-12-13 ENCOUNTER — Emergency Department (HOSPITAL_COMMUNITY)
Admission: EM | Admit: 2015-12-13 | Discharge: 2015-12-13 | Disposition: A | Discharge status: Home / Self Care | Payer: Self-pay | Attending: Emergency Medicine | Admitting: Emergency Medicine

## 2015-12-13 DIAGNOSIS — F17210 Nicotine dependence, cigarettes, uncomplicated: Secondary | ICD-10-CM | POA: Insufficient documentation

## 2015-12-13 DIAGNOSIS — R1013 Epigastric pain: Secondary | ICD-10-CM | POA: Insufficient documentation

## 2015-12-13 DIAGNOSIS — R112 Nausea with vomiting, unspecified: Secondary | ICD-10-CM | POA: Insufficient documentation

## 2015-12-13 LAB — COMPREHENSIVE METABOLIC PANEL
ALBUMIN: 4.2 g/dL (ref 3.5–5.0)
ALT: 38 U/L (ref 17–63)
ANION GAP: 16 — AB (ref 5–15)
AST: 44 U/L — AB (ref 15–41)
Alkaline Phosphatase: 85 U/L (ref 38–126)
BUN: 10 mg/dL (ref 6–20)
CALCIUM: 9.8 mg/dL (ref 8.9–10.3)
CHLORIDE: 92 mmol/L — AB (ref 101–111)
CO2: 27 mmol/L (ref 22–32)
CREATININE: 0.87 mg/dL (ref 0.61–1.24)
GFR calc non Af Amer: >60 mL/min (ref >60)
GLUCOSE: 109 mg/dL — AB (ref 65–99)
Potassium: 3.4 mmol/L — ABNORMAL LOW (ref 3.5–5.1)
Sodium: 135 mmol/L (ref 135–145)
Total Bilirubin: 1.4 mg/dL — ABNORMAL HIGH (ref 0.3–1.2)
Total Protein: 7.9 g/dL (ref 6.5–8.1)

## 2015-12-13 LAB — URINALYSIS, ROUTINE W REFLEX MICROSCOPIC
GLUCOSE, UA: NEGATIVE mg/dL
HGB URINE DIPSTICK: NEGATIVE
Ketones, ur: 40 mg/dL — AB
Leukocytes, UA: NEGATIVE
Nitrite: NEGATIVE
PROTEIN: 30 mg/dL — AB
Specific Gravity, Urine: 1.018 (ref 1.005–1.030)
pH: 8.5 — ABNORMAL HIGH (ref 5.0–8.0)

## 2015-12-13 LAB — CBC
HCT: 43.7 % (ref 39.0–52.0)
Hemoglobin: 15.2 g/dL (ref 13.0–17.0)
MCH: 33.5 pg (ref 26.0–34.0)
MCHC: 34.8 g/dL (ref 30.0–36.0)
MCV: 96.3 fL (ref 78.0–100.0)
PLATELETS: 291 10*3/uL (ref 150–400)
RBC: 4.54 MIL/uL (ref 4.22–5.81)
RDW: 12.6 % (ref 11.5–15.5)
WBC: 7.2 10*3/uL (ref 4.0–10.5)

## 2015-12-13 LAB — TROPONIN I: Troponin I: <0.03 ng/mL (ref <0.03)

## 2015-12-13 LAB — URINE MICROSCOPIC-ADD ON
RBC / HPF: NONE SEEN RBC/hpf (ref 0–5)
Squamous Epithelial / LPF: NONE SEEN

## 2015-12-13 LAB — LIPASE, BLOOD: LIPASE: 33 U/L (ref 11–51)

## 2015-12-13 MED ORDER — GI COCKTAIL ~~LOC~~
30.0000 mL | Freq: Once | ORAL | Status: AC
  Administered 2015-12-13: 30 mL via ORAL
  Filled 2015-12-13: qty 30

## 2015-12-13 MED ORDER — PANTOPRAZOLE SODIUM 20 MG PO TBEC: 20.0000 mg | DELAYED_RELEASE_TABLET | Freq: Every day | ORAL | 1 refills | Status: DC

## 2015-12-13 MED ORDER — SODIUM CHLORIDE 0.9 % IV BOLUS (SEPSIS)
1000.0000 mL | Freq: Once | INTRAVENOUS | Status: AC
  Administered 2015-12-13: 1000 mL via INTRAVENOUS

## 2015-12-13 MED ORDER — ONDANSETRON HCL 4 MG/2ML IJ SOLN
4.0000 mg | Freq: Once | INTRAMUSCULAR | Status: AC
  Administered 2015-12-13: 4 mg via INTRAVENOUS
  Filled 2015-12-13: qty 2

## 2015-12-13 MED ORDER — IOPAMIDOL (ISOVUE-300) INJECTION 61%
100.0000 mL | Freq: Once | INTRAVENOUS | Status: AC | PRN
  Administered 2015-12-13: 100 mL via INTRAVENOUS

## 2015-12-13 MED ORDER — ONDANSETRON 4 MG PO TBDP: 4.0000 mg | ORAL_TABLET | Freq: Three times a day (TID) | ORAL | 0 refills | Status: DC | PRN

## 2015-12-13 MED FILL — ONDANSETRON ODT 4 MG TABLET: 4 | 6 days supply | Qty: 20 | Fill #0

## 2015-12-13 MED FILL — PANTOPRAZOLE SOD DR 20 MG T: 20 | 30 days supply | Qty: 30 | Fill #0

## 2015-12-13 NOTE — Progress Notes (Signed)
                  Dear _________[name]__________________:  You have been approved to have your discharge prescriptions filled through our [facility]) program. This program allows for a one-time (no refills) 34-day supply of selected medications for a low copay amount.  The copay is $3.00 per prescription. For instance, if you have one prescription, you will pay $3.00; for two prescriptions, you pay $6.00; for three prescriptions, you pay $9.00; and so on.  Only certain pharmacies are participating in this program with [facility]. You will need to select one of the pharmacies from the attached list and take your prescriptions, this letter, and your photo ID to one of the participating pharmacies.   We are excited that you are able to use the [facility] program to get your medications. These prescriptions must be filled within 7 days of hospital discharge or they will no longer be valid for the [facility] program. Should you have any problems with your prescriptions please contact your case management team member at [phone].  Thank you,   [facility]   Participating [facility] Pharmacies  [facility] . [facility] [address] [place] [facility] [address] [place] [facility] [address] [place], [place]   [facility] . [address], [place], [place] . [id number], [place], [place] . [address], [place], [place] . [address], [place], [place] . [address], [place], [place] . [address], [place], [place] . [address], [place], [place] . [address], [place], [place] . [address], [place], [place] . [address] [place], [place], [place]  [facility] . [address], [place], [place] . [address] [place],  [place] . [address], [place], [place] . [address], [place], [place] . [address], [place], [place] . [address], [place], [place] [facility] . [address], [place], [place] . [address], [place], [place] . [address], [place], [place] . [address], [place], [place] . [address], [place], [place] . [address], [place], [place] . [address], [place], [place] . [address], [place], [place] . [id number], [place], [place] . [address], [place], [place] . [address], [place], [place] . [address], [place], [place] . [address], [place], [place] . [id number], [place], [place]  Independent Pharmacies . [facility] [address], [address] [place], [place] . [facility] [address] [place], [place] . [facility] [address] [place], [place]   For continued medication needs, please contact the [facility] at  [phone].

## 2015-12-13 NOTE — ED Notes (Signed)
Patient called for blood draw with no answer. 

## 2015-12-13 NOTE — Discharge Instructions (Signed)
Take the Protonix as prescribed and do not drink alcohol. Follow up with the [facility] regarding your abdominal pain to be reevaluated. Return to the emergency department if you experience worsening abdominal pain, blood in your vomit, dark tarry stools, weakness, fevers, diarrhea, or any other concerning symptoms.

## 2015-12-13 NOTE — Progress Notes (Signed)
Discussed pt with CM lead Med entered in PDMI for homeless Cm reviewed with pt his letter and directions again  CM discussed his 92015 & 2017 MATCH assistance Cm reviewed haldol, protonix and cogentin goodrx cost sheets, [facility] to total $22-23 for next month in detail Pt states he can afford that  Pt mentioned in conversation that he has a "brother" locally "[place] [phone]' and will be able to make money to get cost of next month meds or go to [facility]  Pt is familiar with DSS and gets "food stamp card"

## 2015-12-13 NOTE — ED Triage Notes (Signed)
Pt states that he has had abdominal pain w/ N/V x 2 days. CBG 124. Alert and oriented. Ambulatory.

## 2015-12-13 NOTE — Progress Notes (Signed)
Attempts to reach ED PA, Jessica x 2 after Reviewing her ED note, labs and imaging Question tx for r/o possible sbo
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
## 2015-12-13 NOTE — Progress Notes (Signed)
ED CM consulted by EDPA Shanda BumpsJessica for medication assistance   CM reviewed EPIC notes and chart review information CM spoke with the pt about [facility] program ($3 co pay for each Rx through [facility] program, does not include refills, 7 day expiration of MATCH letter and choice of pharmacies) Pt agreed to receive assistance from program    Pt is eligible for [facility] program (unable to find pt listed in PDMI per cardholder name inquiry)  PDMI information entered. MATCH letter completed and provided to pt.   CM updated EDP and ED RN   confirmation of d/c medication list - protonix  CM spoke with pt who [place]. CM discussed and provided written information for self pay pcps, importance of pcp for f/u care, www.needymeds.org, discounted pharmacies and other [place] resources such as financial assistance, DSS and  health department  Reviewed resources for [place] self pay pcps like [facility], family medicine at [address], [facility], general medical clinics, [facility] plus others, [facility] out [facility], housing, and other resources in [place]. Pt voiced understanding and appreciation of resources provided  Pt states he is still homeless but has been able [place] [address]" Pt states he has not taken meds in months but is familiar with [facility] States he is not familiar with [facility], [employer] nor [facility] [place] Cm discussed each one of these resources with him including goodrx.com in details Discussed availability to go to [facility] or [place] [employer] [facility] to get access to computer and internet Pt states not knowing how to  Use internet but denies issues with reading concerns Cm discussed that he could be taught at [facility] or [employer] [facility] how to use it to access goodrx so he could be able to get discount cards for his medications for after the month or less supply  of medication offered by EDPA is gone Pt able to repeat back to Cm via teach back method of his need to [facility] to pick up medications today plus the plan to go to [facility] or [facility] [place] to get assist to get his medication next month and each month from that point on. The pt states he prefers [facility]  This pt was reminded that these resources are the same resources provided by [facility] ED PM CM recently and if he is non compliant further services would be determined He voiced understanding Pt with [facility] 9 ED visits and 2 admissions in the last 6 months  Pt also given cost sheets for each med with a discount card from good rx

## 2015-12-28 ENCOUNTER — Emergency Department (HOSPITAL_BASED_OUTPATIENT_CLINIC_OR_DEPARTMENT_OTHER): Payer: Self-pay

## 2015-12-28 ENCOUNTER — Encounter (HOSPITAL_BASED_OUTPATIENT_CLINIC_OR_DEPARTMENT_OTHER): Payer: Self-pay

## 2015-12-28 ENCOUNTER — Emergency Department (HOSPITAL_BASED_OUTPATIENT_CLINIC_OR_DEPARTMENT_OTHER)
Admission: EM | Admit: 2015-12-28 | Discharge: 2015-12-29 | Disposition: A | Discharge status: Home / Self Care | Payer: Self-pay | Attending: Emergency Medicine | Admitting: Emergency Medicine

## 2015-12-28 DIAGNOSIS — F10120 Alcohol abuse with intoxication, uncomplicated: Secondary | ICD-10-CM | POA: Insufficient documentation

## 2015-12-28 DIAGNOSIS — F10920 Alcohol use, unspecified with intoxication, uncomplicated: Secondary | ICD-10-CM

## 2015-12-28 DIAGNOSIS — E876 Hypokalemia: Secondary | ICD-10-CM | POA: Insufficient documentation

## 2015-12-28 LAB — COMPREHENSIVE METABOLIC PANEL
ALBUMIN: 3.7 g/dL (ref 3.5–5.0)
ALT: 20 U/L (ref 17–63)
AST: 36 U/L (ref 15–41)
Alkaline Phosphatase: 61 U/L (ref 38–126)
Anion gap: 12 (ref 5–15)
BILIRUBIN TOTAL: 0.6 mg/dL (ref 0.3–1.2)
BUN: 8 mg/dL (ref 6–20)
CALCIUM: 9.2 mg/dL (ref 8.9–10.3)
CHLORIDE: 91 mmol/L — AB (ref 101–111)
CO2: 26 mmol/L (ref 22–32)
Creatinine, Ser: 0.57 mg/dL — ABNORMAL LOW (ref 0.61–1.24)
GFR calc Af Amer: >60 mL/min (ref >60)
GFR calc non Af Amer: >60 mL/min (ref >60)
GLUCOSE: 87 mg/dL (ref 65–99)
POTASSIUM: 2.8 mmol/L — AB (ref 3.5–5.1)
Sodium: 129 mmol/L — ABNORMAL LOW (ref 135–145)
Total Protein: 7.2 g/dL (ref 6.5–8.1)

## 2015-12-28 LAB — URINALYSIS, ROUTINE W REFLEX MICROSCOPIC
BILIRUBIN URINE: NEGATIVE
GLUCOSE, UA: NEGATIVE mg/dL
HGB URINE DIPSTICK: NEGATIVE
KETONES UR: NEGATIVE mg/dL
Leukocytes, UA: NEGATIVE
Nitrite: NEGATIVE
PROTEIN: NEGATIVE mg/dL
Specific Gravity, Urine: 1.003 — ABNORMAL LOW (ref 1.005–1.030)
pH: 7 (ref 5.0–8.0)

## 2015-12-28 LAB — LIPASE, BLOOD: LIPASE: 41 U/L (ref 11–51)

## 2015-12-28 LAB — CBC WITH DIFFERENTIAL/PLATELET
Basophils Absolute: 0.1 10*3/uL (ref 0.0–0.1)
Basophils Relative: 1 %
EOS ABS: 0.8 10*3/uL — AB (ref 0.0–0.7)
EOS PCT: 14 %
HCT: 37.8 % — ABNORMAL LOW (ref 39.0–52.0)
Hemoglobin: 13.4 g/dL (ref 13.0–17.0)
LYMPHS ABS: 2.3 10*3/uL (ref 0.7–4.0)
Lymphocytes Relative: 39 %
MCH: 33.3 pg (ref 26.0–34.0)
MCHC: 35.4 g/dL (ref 30.0–36.0)
MCV: 94 fL (ref 78.0–100.0)
MONO ABS: 0.5 10*3/uL (ref 0.1–1.0)
Monocytes Relative: 9 %
Neutro Abs: 2.2 10*3/uL (ref 1.7–7.7)
Neutrophils Relative %: 37 %
PLATELETS: 258 10*3/uL (ref 150–400)
RBC: 4.02 MIL/uL — AB (ref 4.22–5.81)
RDW: 11.3 % — AB (ref 11.5–15.5)
WBC: 5.9 10*3/uL (ref 4.0–10.5)

## 2015-12-28 LAB — TROPONIN I

## 2015-12-28 LAB — MAGNESIUM: Magnesium: 1.9 mg/dL (ref 1.7–2.4)

## 2015-12-28 MED ORDER — POTASSIUM CHLORIDE 10 MEQ/100ML IV SOLN
10.0000 meq | INTRAVENOUS | Status: DC
  Filled 2015-12-28: qty 100

## 2015-12-28 MED ORDER — SODIUM CHLORIDE 0.9 % IV BOLUS (SEPSIS)
1000.0000 mL | Freq: Once | INTRAVENOUS | Status: AC
  Administered 2015-12-28: 1000 mL via INTRAVENOUS

## 2015-12-28 MED ORDER — POTASSIUM CHLORIDE 10 MEQ/100ML IV SOLN
10.0000 meq | INTRAVENOUS | Status: DC
  Administered 2015-12-28 – 2015-12-29 (×2): 10 meq via INTRAVENOUS
  Filled 2015-12-28 (×2): qty 100

## 2015-12-28 MED ORDER — ONDANSETRON HCL 4 MG/2ML IJ SOLN
4.0000 mg | Freq: Once | INTRAMUSCULAR | Status: AC
  Administered 2015-12-28: 4 mg via INTRAVENOUS
  Filled 2015-12-28: qty 2

## 2015-12-28 MED ORDER — GI COCKTAIL ~~LOC~~
30.0000 mL | Freq: Once | ORAL | Status: AC
  Administered 2015-12-28: 30 mL via ORAL
  Filled 2015-12-28: qty 30

## 2015-12-28 MED ORDER — POTASSIUM CHLORIDE CRYS ER 20 MEQ PO TBCR
60.0000 meq | EXTENDED_RELEASE_TABLET | Freq: Once | ORAL | Status: DC
  Filled 2015-12-28: qty 3

## 2015-12-28 MED ORDER — SUCRALFATE 1 G PO TABS
1.0000 g | ORAL_TABLET | Freq: Once | ORAL | Status: AC
  Administered 2015-12-28: 1 g via ORAL
  Filled 2015-12-28: qty 1

## 2015-12-28 NOTE — ED Provider Notes (Signed)
Complains of epigastric pain for the past 2 days with several episodes of vomiting. Pain is mild at present. He denies nausea at present. Nothing makes symptoms better or worse. He admits to drinking 6 beers today and 6 beers yesterday. On exam sleepy, easily arousable. No distress lungs with mild to very wheezes heart regular rate and rhythm abdomen nondistended, normoactive bowel sounds, minimal tenderness at epigastrium, no guarding rigidity or rebound   Jimmy SouSam Avalon Coppinger, MD 12/28/15 2339
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
## 2015-12-28 NOTE — ED Triage Notes (Addendum)
Pt comes in via EMS, [facility] and 911 was called bc pt c/o chronic abdominal pain that is not helped by antacids.  Pt admits to drinking "1 beer" today.  Pt is alert and oriented, walking steady without assistance.

## 2015-12-29 MED ORDER — VITAMIN B-1 100 MG PO TABS
50.0000 mg | ORAL_TABLET | Freq: Once | ORAL | Status: AC
  Administered 2015-12-29: 50 mg via ORAL
  Filled 2015-12-29: qty 1

## 2015-12-29 MED ORDER — POTASSIUM CHLORIDE CRYS ER 20 MEQ PO TBCR
60.0000 meq | EXTENDED_RELEASE_TABLET | Freq: Once | ORAL | Status: AC
  Administered 2015-12-29: 60 meq via ORAL
  Filled 2015-12-29: qty 3

## 2015-12-29 MED ORDER — POTASSIUM CHLORIDE CRYS ER 20 MEQ PO TBCR: 40.0000 meq | EXTENDED_RELEASE_TABLET | Freq: Once | ORAL | Status: DC

## 2015-12-29 NOTE — Discharge Instructions (Signed)
Substance Abuse Treatment Programs ° °Intensive Outpatient Programs °[facility]     °[address]      °[place], [place]                   °[phone]      ° °[facility] °[address] °[place], [place] °[phone] ° °[facility]     °(Inpatient and outpatient)     °[address]           °[phone]   ° °[facility] °[phone] (Suboxone and Methadone) ° °[address]      °[place], [place] [address]      °[phone]      ° °[address] °[place], [place] °852-3033 ° °[facility] (Outpatient/Inpatient, Chemical)    °(insurance only) [phone]      °       °[facility] (Groups & Residential) °[place], [place] °[phone] ° °   °[facility]     °[address]     °[place], [place]      °[phone]      ° °[facility] (for caregivers and family) °[address] °[place], [place] °336-299-4655 ° ° ° ° ° °Residential Treatment Programs °[facility]      °[address], [place], [place] [address]  °[phone]      ° °[facility] °[address] [place], [place] [address] °[phone] ° °[facility]        °336-248-8914      ° °[facility] °1-800-659-3381 ° °[facility]             °[address]                                         °[place], [place]                                                °[phone] or 336-784-9470                              ° °[facility] [place] °[address] °[place], [address] °[phone] ° °[facility]    °[address]      °[place], [place]     °[phone]      ° °[facility] °[address] °[place], [place] °336-285-9073 ° °[facility]   °[address]     °[place], [place] [address]     °[phone]      °Admissions: 8am-3pm M-F ° °[facility] °[address] °[place],  [place] °336-227-7417 ° °[facility]: Residential Program (90 Days)   °[place],       °336-725-8389 or [phone]    ° °[facility] °[place],  °(Walk in Hours over the weekend or by referral) ° °[facility] °[address], [place],  [address] °([phone] ° °Crisis Mobile: [facility]:  1-877-626-1772 (for crisis response 24 hours a day) °[facility] Hotline:      1-800-256-2452 °Outpatient Psychiatry and Counseling ° °[facility]: Mobile Crisis   Management 24 hours:  1-877-626-1772 ° °[facility] sliding scale fee and walk in schedule: M-F 8am-12pm/1pm-3pm °[address]  °[place], [place] [address] °[phone] ° °[facility] °[address] °[place], [place] [address] °336-703-9650 ° °[facility] ([facility])- new patient walk-in appointments available Monday - Friday 8am -3pm.          °[address] °[place], [place] [address] °336-676-6840 or crisis line- 336-676-6905 ° °[facility] Intensive Outpatient Therapy Program °[address] °[place], [place] [address] °336-832-9804 ° °[facility]                  °Crisis Services      °[phone]      °[address]     °[place], [place] [address]                ° °[facility]   °[facility] °[phone] °[address] °[place], [place] [address] ° ° °[facility]          °[address]  °[place], [place] [address]       °[phone] ° °[facility] °[address] °[place], [place] [address] °336-292-1510 ° °[facility]    °[address]    °[place], [place] [address]     °[phone]      ° °Parish McKinney, MD     °[address]     °[place] [address]     °336-282-1251     °  °[facility] °[address] °[place] [address] ° °[facility]     °[address]     °[place], [place]      °336-542-2076      ° °[facility] °Shamsher Ahluwalia, MD °[address] °[place], [place] [address] °[phone] ° °[facility]     °[address]     °[place], [place] [address]     °336-274-1237      ° °[facility] °[address] °[place], [place] [address] °336-854-4450 °Resources for Temporary Residential Assistance/Crisis Centers ° °DAY CENTERS °[facility] °M-F 8am-3pm   °[address] [place], [place] [address]   336-332-0824 °Services include: laundry, barbering, support groups, case management, phone  & computer access, showers, AA/NA mtgs, mental health/substance abuse nurse, job skills class, disability information, VA assistance, spiritual classes, etc.  ° °HOMELESS SHELTERS ° °[facility]     °[facility]   °[address], [place]     °[phone]       °       °[facility] (women and children)       °[address] °[place], [place] [address] °336-275-0820 °[email or web address] for application and process °Application Required ° °[facility]   °[address]    °[place] [address]     °[phone]       °             °[facility] °[address] °[place], [place] [address] °336.273.5572 °336-235-0363(schedule application appt.) °Application Required ° °[facility] (women only)    °[address]     °[place],  [address]     °336-884-1039      °  Intake starts 6pm daily °Need valid ID, SSC, & Police report °[facility] [place] °[address] °[place], [place] °336-881-5420 °Application Required ° °[facility] (men only)     °[address]      °[place], [place]     °[phone]      ° °[facility] °(Pregnant women only) °[address] °, [place] °336-275-0206 ° °[facility]      °[address]      °[place], [place] [address]     °[phone]      °       °[facility] °[address] °[place], [place] °336-723-1848 °90 day commitment/SA/Application process ° °[facility](men only)     °[address]     °[place], [place]     °[phone]       °Check-in at 7pm     °       °[facility] °[address] °[place], [place] [address] °336-248-6684 °Men/Women/Women and Children must be there by 7 pm ° °[facility] °[place],  °336-722-8721                ° °

## 2015-12-29 NOTE — ED Notes (Signed)
Pt eating grits and drinking sprite without issue.

## 2015-12-29 NOTE — ED Notes (Signed)
Pt verbalizes understanding of d/c instructions and denies any further needs at this time. 

## 2016-01-08 ENCOUNTER — Encounter (HOSPITAL_COMMUNITY): Payer: Self-pay | Admitting: Family Medicine

## 2016-01-08 DIAGNOSIS — Z79899 Other long term (current) drug therapy: Secondary | ICD-10-CM | POA: Insufficient documentation

## 2016-01-08 DIAGNOSIS — R440 Auditory hallucinations: Secondary | ICD-10-CM | POA: Insufficient documentation

## 2016-01-08 DIAGNOSIS — R45850 Homicidal ideations: Secondary | ICD-10-CM | POA: Insufficient documentation

## 2016-01-08 DIAGNOSIS — F1010 Alcohol abuse, uncomplicated: Secondary | ICD-10-CM | POA: Insufficient documentation

## 2016-01-08 DIAGNOSIS — F17210 Nicotine dependence, cigarettes, uncomplicated: Secondary | ICD-10-CM | POA: Insufficient documentation

## 2016-01-08 NOTE — ED Triage Notes (Signed)
Patient reports he would like to detox from ETOH and "dope", cocaine. Pt last used cocaine 2 days ago and last drink one hour ago.

## 2016-01-09 ENCOUNTER — Encounter (HOSPITAL_COMMUNITY): Payer: Self-pay

## 2016-01-09 ENCOUNTER — Observation Stay (HOSPITAL_COMMUNITY)
Admission: AD | Admit: 2016-01-09 | Discharge: 2016-01-10 | Disposition: A | Discharge status: Home / Self Care | Payer: Self-pay | Source: Intra-hospital | Attending: Psychiatry | Admitting: Psychiatry

## 2016-01-09 ENCOUNTER — Emergency Department (HOSPITAL_COMMUNITY)
Admission: EM | Admit: 2016-01-09 | Discharge: 2016-01-09 | Disposition: A | Discharge status: Other Acute Inpatient Hospital | Payer: Self-pay | Attending: Emergency Medicine | Admitting: Emergency Medicine

## 2016-01-09 DIAGNOSIS — Z590 Homelessness: Secondary | ICD-10-CM | POA: Insufficient documentation

## 2016-01-09 DIAGNOSIS — F1910 Other psychoactive substance abuse, uncomplicated: Secondary | ICD-10-CM | POA: Diagnosis present

## 2016-01-09 DIAGNOSIS — F1014 Alcohol abuse with alcohol-induced mood disorder: Principal | ICD-10-CM | POA: Diagnosis present

## 2016-01-09 DIAGNOSIS — Z79899 Other long term (current) drug therapy: Secondary | ICD-10-CM

## 2016-01-09 DIAGNOSIS — Z8489 Family history of other specified conditions: Secondary | ICD-10-CM

## 2016-01-09 DIAGNOSIS — R45850 Homicidal ideations: Secondary | ICD-10-CM

## 2016-01-09 DIAGNOSIS — Z811 Family history of alcohol abuse and dependence: Secondary | ICD-10-CM

## 2016-01-09 DIAGNOSIS — R45851 Suicidal ideations: Secondary | ICD-10-CM

## 2016-01-09 DIAGNOSIS — F17210 Nicotine dependence, cigarettes, uncomplicated: Secondary | ICD-10-CM

## 2016-01-09 DIAGNOSIS — F1010 Alcohol abuse, uncomplicated: Secondary | ICD-10-CM

## 2016-01-09 DIAGNOSIS — R440 Auditory hallucinations: Secondary | ICD-10-CM

## 2016-01-09 DIAGNOSIS — F1020 Alcohol dependence, uncomplicated: Secondary | ICD-10-CM | POA: Diagnosis present

## 2016-01-09 DIAGNOSIS — F1094 Alcohol use, unspecified with alcohol-induced mood disorder: Secondary | ICD-10-CM

## 2016-01-09 LAB — CBC WITH DIFFERENTIAL/PLATELET
Basophils Absolute: 0 10*3/uL (ref 0.0–0.1)
Basophils Relative: 0 %
EOS PCT: 19 %
Eosinophils Absolute: 1.2 10*3/uL — ABNORMAL HIGH (ref 0.0–0.7)
HEMATOCRIT: 40.4 % (ref 39.0–52.0)
Hemoglobin: 13.7 g/dL (ref 13.0–17.0)
LYMPHS ABS: 2.3 10*3/uL (ref 0.7–4.0)
Lymphocytes Relative: 37 %
MCH: 33 pg (ref 26.0–34.0)
MCHC: 33.9 g/dL (ref 30.0–36.0)
MCV: 97.3 fL (ref 78.0–100.0)
MONO ABS: 0.3 10*3/uL (ref 0.1–1.0)
MONOS PCT: 4 %
NEUTROS ABS: 2.5 10*3/uL (ref 1.7–7.7)
Neutrophils Relative %: 40 %
PLATELETS: 292 10*3/uL (ref 150–400)
RBC: 4.15 MIL/uL — AB (ref 4.22–5.81)
RDW: 12.5 % (ref 11.5–15.5)
WBC: 6.3 10*3/uL (ref 4.0–10.5)

## 2016-01-09 LAB — COMPREHENSIVE METABOLIC PANEL
ALT: 25 U/L (ref 17–63)
ANION GAP: 9 (ref 5–15)
AST: 37 U/L (ref 15–41)
Albumin: 3.8 g/dL (ref 3.5–5.0)
Alkaline Phosphatase: 59 U/L (ref 38–126)
BILIRUBIN TOTAL: 0.6 mg/dL (ref 0.3–1.2)
BUN: 5 mg/dL — ABNORMAL LOW (ref 6–20)
CHLORIDE: 103 mmol/L (ref 101–111)
CO2: 22 mmol/L (ref 22–32)
Calcium: 8.5 mg/dL — ABNORMAL LOW (ref 8.9–10.3)
Creatinine, Ser: 0.59 mg/dL — ABNORMAL LOW (ref 0.61–1.24)
Glucose, Bld: 98 mg/dL (ref 65–99)
POTASSIUM: 3.8 mmol/L (ref 3.5–5.1)
Sodium: 134 mmol/L — ABNORMAL LOW (ref 135–145)
TOTAL PROTEIN: 7.1 g/dL (ref 6.5–8.1)

## 2016-01-09 LAB — SALICYLATE LEVEL

## 2016-01-09 LAB — RAPID URINE DRUG SCREEN, HOSP PERFORMED
Amphetamines: NOT DETECTED
Barbiturates: NOT DETECTED
Benzodiazepines: NOT DETECTED
COCAINE: NOT DETECTED
OPIATES: NOT DETECTED
TETRAHYDROCANNABINOL: NOT DETECTED

## 2016-01-09 LAB — ETHANOL: ALCOHOL ETHYL (B): 116 mg/dL — AB (ref <5)

## 2016-01-09 LAB — ACETAMINOPHEN LEVEL

## 2016-01-09 MED ORDER — MAGNESIUM HYDROXIDE 400 MG/5ML PO SUSP: 30.0000 mL | Freq: Every day | ORAL | Status: DC | PRN

## 2016-01-09 MED ORDER — ACETAMINOPHEN 325 MG PO TABS: 650.0000 mg | ORAL_TABLET | Freq: Four times a day (QID) | ORAL | Status: DC | PRN

## 2016-01-09 MED ORDER — HYDROXYZINE HCL 25 MG PO TABS: 25.0000 mg | ORAL_TABLET | Freq: Four times a day (QID) | ORAL | Status: DC | PRN

## 2016-01-09 MED ORDER — PNEUMOCOCCAL VAC POLYVALENT 25 MCG/0.5ML IJ INJ
0.5000 mL | INJECTION | INTRAMUSCULAR | Status: AC
  Administered 2016-01-10: 0.5 mL via INTRAMUSCULAR

## 2016-01-09 MED ORDER — TRAZODONE HCL 50 MG PO TABS
50.0000 mg | ORAL_TABLET | Freq: Every day | ORAL | Status: DC
  Administered 2016-01-09: 50 mg via ORAL
  Filled 2016-01-09: qty 1

## 2016-01-09 MED ORDER — NICOTINE 21 MG/24HR TD PT24
21.0000 mg | MEDICATED_PATCH | Freq: Every day | TRANSDERMAL | Status: DC
  Administered 2016-01-10: 21 mg via TRANSDERMAL
  Filled 2016-01-09: qty 1

## 2016-01-09 MED ORDER — NICOTINE 21 MG/24HR TD PT24
21.0000 mg | MEDICATED_PATCH | Freq: Once | TRANSDERMAL | Status: DC
  Administered 2016-01-09: 21 mg via TRANSDERMAL
  Filled 2016-01-09: qty 1

## 2016-01-09 MED ORDER — ALUM & MAG HYDROXIDE-SIMETH 200-200-20 MG/5ML PO SUSP: 30.0000 mL | ORAL | Status: DC | PRN

## 2016-01-09 MED ORDER — INFLUENZA VAC SPLIT QUAD 0.5 ML IM SUSY
0.5000 mL | PREFILLED_SYRINGE | INTRAMUSCULAR | Status: AC
  Administered 2016-01-10: 0.5 mL via INTRAMUSCULAR
  Filled 2016-01-09: qty 0.5

## 2016-01-09 NOTE — BH Assessment (Signed)
[facility] Assessment Progress Note  Per Thedore MinsMojeed Akintayo, MD, this pt [facility].  Lillia AbedLindsay, RN, [facility] pt to Obs 4; they will be ready to receive pt at 13:00.  Pt has signed Voluntary Admission and Consent for Treatment, as well as Consent to Release Information to no one, and signed forms [facility].  [name] nurse has been notified, and agrees to send original paperwork along with pt via [facility], and to call report to [phone] or [phone].  Doylene Canninghomas Patricia Fargo, MA Triage Specialist [phone]

## 2016-01-09 NOTE — ED Notes (Signed)
[facility] gave report to RN
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
## 2016-01-09 NOTE — ED Notes (Signed)
[facility] here to pick up patient

## 2016-01-09 NOTE — ED Notes (Addendum)
Report attempted several times [facility]

## 2016-01-09 NOTE — ED Notes (Signed)
[facility] transportation called  
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
## 2016-01-09 NOTE — Progress Notes (Signed)
Admission Note: Ad[facility] patient, a 60 y/o male, [facility].  [facility]  Patient reportedly presented to ER with suicidal ideation and for Substance Abuse treatment.  On arrival he denies suicidal and homicidal ideation.  He does report auditory hallucinations.  He states that he hears voices carrying on a conversation but states that voices do not tell him to hurt himself.  He denies visual hallucinations.  Patient pleasant and cooperative.  He reports that he drinks at least 12 beers daily with his last use yesterday. He also reports cocaine use, stating last use 2 to 3 days ago when he used $400 - $500 dollars worth cocaine. Patient and belongings checked for contraband.  Belongings locked up in [address] and [address] by Security and bag placed on luggage shelf in search room. Skin assessment done and witnessed by two staff members.  Patient has multiple tattoos but no other skin abnormalities noted.  Patient oriented to unit and plan of care. Emotional support provided; encouraged him to seek assistance with needs/concerns.

## 2016-01-09 NOTE — ED Notes (Addendum)
TTS recommends AM psych evaluation.

## 2016-01-10 NOTE — Discharge Instructions (Signed)
Pt will be provided with a list of resources to [place] facilities [place] and will be provided with a bus pass prior to discharge.
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
## 2016-01-10 NOTE — Progress Notes (Signed)
D: Pt A & O X4. Denies SI, HI, AVH and pain at this time. D/C home as ordered. Bus pass fare ($1.50) given to pt at time of d/c. A: Scheduled medications (Flu and Pneumonia injections) administered as ordered with verbal education. Emotional support and availability provided to pt. Encouraged to voice concerns and comply with current treatment regimen. D/C instructions reviewed with pt. All belongings in assigned lockers returned to pt at time of d/c. Safety maintained in Observation Unit without self harm gestures to report at this time.  R: Pt compliant with medications as ordered. Denies adverse drug reactions when assessed. Verbalized understanding related to d/c instructions. Signed belonging sheet in agreement with items received from lockers 27 & 28 and his backpack. Appears to be in no physical distress at time of departure from facility.

## 2016-01-10 NOTE — B[facility] Counselor (Signed)
This Probation officer met with pt who communicated that he was ready to be discharged. Pt states "I feel better and I look better". This Probation officer asked pt if he wanted a follow up to an OPT provider; this pt stated "no". This Probation officer will provide the pt with a list of resources to [place] facilities in the [facility] area. This Probation officer notified the NP Jinny Blossom that pt was ready to be discharged. NP came in and reassessed this pt and determined that pt was stable enough to be discharged back to his living environment. This Probation officer will provide pt with a bus pass prior to discharge.

## 2016-01-10 NOTE — Progress Notes (Signed)
D: Patient seen watching TV at the time of shift change. Patient denies pain, SI, AH/VH at this time. Requested "sleeping pill". Made no further complaint. Accepted his due meds. No behavioral issues noted.  A: Staff offered support and encouragement as needded. Constant monitoring maintained for safety except when patient is in the bathroom. Will continue to monitor patient for safety and stability.  R: Patient remains safe.

## 2016-02-02 ENCOUNTER — Encounter (HOSPITAL_COMMUNITY): Payer: Self-pay | Admitting: Emergency Medicine

## 2016-02-02 ENCOUNTER — Emergency Department (HOSPITAL_COMMUNITY)
Admission: EM | Admit: 2016-02-02 | Discharge: 2016-02-03 | Disposition: A | Discharge status: Home / Self Care | Payer: Self-pay | Attending: Emergency Medicine | Admitting: Emergency Medicine

## 2016-02-02 DIAGNOSIS — Y999 Unspecified external cause status: Secondary | ICD-10-CM | POA: Insufficient documentation

## 2016-02-02 DIAGNOSIS — Y929 Unspecified place or not applicable: Secondary | ICD-10-CM | POA: Insufficient documentation

## 2016-02-02 DIAGNOSIS — Y939 Activity, unspecified: Secondary | ICD-10-CM | POA: Insufficient documentation

## 2016-02-02 DIAGNOSIS — R45851 Suicidal ideations: Secondary | ICD-10-CM | POA: Insufficient documentation

## 2016-02-02 DIAGNOSIS — X58XXXA Exposure to other specified factors, initial encounter: Secondary | ICD-10-CM | POA: Insufficient documentation

## 2016-02-02 DIAGNOSIS — R4689 Other symptoms and signs involving appearance and behavior: Secondary | ICD-10-CM

## 2016-02-02 DIAGNOSIS — S90424A Blister (nonthermal), right lesser toe(s), initial encounter: Secondary | ICD-10-CM | POA: Insufficient documentation

## 2016-02-02 DIAGNOSIS — Z5181 Encounter for therapeutic drug level monitoring: Secondary | ICD-10-CM | POA: Insufficient documentation

## 2016-02-02 DIAGNOSIS — F17210 Nicotine dependence, cigarettes, uncomplicated: Secondary | ICD-10-CM | POA: Insufficient documentation

## 2016-02-02 DIAGNOSIS — R45850 Homicidal ideations: Secondary | ICD-10-CM

## 2016-02-02 DIAGNOSIS — R4589 Other symptoms and signs involving emotional state: Secondary | ICD-10-CM

## 2016-02-02 LAB — CBC WITH DIFFERENTIAL/PLATELET
Basophils Absolute: 0.1 K/uL (ref 0.0–0.1)
Basophils Relative: 1 %
Eosinophils Absolute: 0.9 K/uL — ABNORMAL HIGH (ref 0.0–0.7)
Eosinophils Relative: 14 %
HCT: 38.8 % — ABNORMAL LOW (ref 39.0–52.0)
Hemoglobin: 13.7 g/dL (ref 13.0–17.0)
Lymphocytes Relative: 34 %
Lymphs Abs: 2.3 K/uL (ref 0.7–4.0)
MCH: 32.9 pg (ref 26.0–34.0)
MCHC: 35.3 g/dL (ref 30.0–36.0)
MCV: 93 fL (ref 78.0–100.0)
Monocytes Absolute: 0.6 K/uL (ref 0.1–1.0)
Monocytes Relative: 9 %
Neutro Abs: 2.9 K/uL (ref 1.7–7.7)
Neutrophils Relative %: 42 %
Platelets: 251 K/uL (ref 150–400)
RBC: 4.17 MIL/uL — ABNORMAL LOW (ref 4.22–5.81)
RDW: 11.8 % (ref 11.5–15.5)
WBC: 6.7 K/uL (ref 4.0–10.5)

## 2016-02-02 LAB — ACETAMINOPHEN LEVEL: Acetaminophen (Tylenol), Serum: <10 ug/mL — ABNORMAL LOW (ref 10–30)

## 2016-02-02 LAB — COMPREHENSIVE METABOLIC PANEL WITH GFR
ALT: 39 U/L (ref 17–63)
AST: 56 U/L — ABNORMAL HIGH (ref 15–41)
Albumin: 4.1 g/dL (ref 3.5–5.0)
Alkaline Phosphatase: 58 U/L (ref 38–126)
Anion gap: 11 (ref 5–15)
BUN: 6 mg/dL (ref 6–20)
CO2: 26 mmol/L (ref 22–32)
Calcium: 9 mg/dL (ref 8.9–10.3)
Chloride: 90 mmol/L — ABNORMAL LOW (ref 101–111)
Creatinine, Ser: 0.55 mg/dL — ABNORMAL LOW (ref 0.61–1.24)
GFR calc Af Amer: >60 mL/min
GFR calc non Af Amer: >60 mL/min
Glucose, Bld: 87 mg/dL (ref 65–99)
Potassium: 3.2 mmol/L — ABNORMAL LOW (ref 3.5–5.1)
Sodium: 127 mmol/L — ABNORMAL LOW (ref 135–145)
Total Bilirubin: 0.5 mg/dL (ref 0.3–1.2)
Total Protein: 7.4 g/dL (ref 6.5–8.1)

## 2016-02-02 LAB — SALICYLATE LEVEL: Salicylate Lvl: <7 mg/dL (ref 2.8–30.0)

## 2016-02-02 LAB — ETHANOL: Alcohol, Ethyl (B): 159 mg/dL — ABNORMAL HIGH
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
## 2016-02-02 NOTE — ED Notes (Signed)
TTS at bedside, delay in labs
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
## 2016-02-02 NOTE — ED Notes (Signed)
Report called [facility] [place]ine[facility] [place] [facility]

## 2016-02-02 NOTE — ED Triage Notes (Signed)
Pt brought by EMS for evaluation of foot pain that has been chronic. Denies any injury.

## 2016-02-02 NOTE — ED Notes (Signed)
Patient changed into scrubs and wanded by security. ?

## 2016-02-03 ENCOUNTER — Encounter (HOSPITAL_COMMUNITY): Payer: Self-pay | Admitting: Registered Nurse

## 2016-02-03 LAB — RAPID URINE DRUG SCREEN, HOSP PERFORMED
Amphetamines: NOT DETECTED
Barbiturates: NOT DETECTED
Benzodiazepines: NOT DETECTED
Cocaine: NOT DETECTED
Opiates: NOT DETECTED
Tetrahydrocannabinol: NOT DETECTED

## 2016-02-03 LAB — URINALYSIS, ROUTINE W REFLEX MICROSCOPIC
Bilirubin Urine: NEGATIVE
Glucose, UA: NEGATIVE mg/dL
Hgb urine dipstick: NEGATIVE
Ketones, ur: NEGATIVE mg/dL
Leukocytes, UA: NEGATIVE
Nitrite: NEGATIVE
Protein, ur: NEGATIVE mg/dL
Specific Gravity, Urine: 1.007 (ref 1.005–1.030)
pH: 7 (ref 5.0–8.0)

## 2016-02-03 NOTE — ED Notes (Signed)
Pt came up to nurses' station and asked to be signed out. Dr. Fayrene FearingJames made aware. Said for pt to wait and be evaluated by Psych MD. Jimmy MondaySpoke with patient about same. Patient is OK with waiting until Psych MD see him. Resting quietly in bed watching television.

## 2016-02-03 NOTE — [facility] Suicide Risk Assessment (Cosign Needed)
Suicide Risk Assessment  [facility] Discharge Suicide Risk Assessment   Principal Problem: <principal problem not specified> Discharge Diagnoses:  Patient Active Problem List   Diagnosis Date Noted  . Alcohol abuse with alcohol-induced mood disorder (HCC) [F10.14] 01/09/2016  . Homicidal ideation [R45.850]   . Alcohol-induced mood disorder (HCC) [F10.94] 06/29/2015  . Severe alcohol use disorder (HCC) [F10.20] 06/24/2015  . Substance induced mood disorder (HCC) [F19.94] 06/24/2015  . Alcohol use disorder, severe, dependence (HCC) [F10.20] 06/11/2015  . 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 Fracture of bone [T14.8XXA] 06/11/2015  . Polysubstance abuse [F19.10] 09/06/2014  . GIB (gastrointestinal bleeding) [K92.2] 09/06/2014  . Homeless [Z59.0] 09/06/2014  . GERD (gastroesophageal reflux disease) [K21.9]   . PUD (peptic ulcer disease) [K27.9]   . Gastroesophageal reflux disease without esophagitis [K21.9]   . Hypokalemia [E87.6]   . Alcohol abuse [F10.10] 12/05/2013  . S/P alcohol detoxification [Z09] 12/01/2013  . Seizure (HCC) [R56.9] 08/24/2013  . Tobacco use disorder [F17.200] 08/19/2013    Total Time spent with patient: 30 minutes  Musculoskeletal: Strength & Muscle Tone: within normal limits Gait & Station: normal Patient leans: N/A  Psychiatric Specialty Exam: Physical Exam  Constitutional: He is oriented to person, place, and time.  Neck: Normal range of motion.  Respiratory: Effort normal.  Musculoskeletal: Normal range of motion.  Neurological: He is alert and oriented to person, place, and time.    ROS  Blood pressure 111/74, pulse 82, temperature 98.1 F (36.7 C), temperature source Oral, resp. rate 18, height 5\' 6"  (1.676 m), weight 70.3 kg (155 lb), SpO2 96 %.Body mass index is 25.02 kg/m.  General Appearance: Casual  Eye Contact:  Good  Speech:  Clear and Coherent and Normal  Rate  Volume:  Normal  Mood:  "Good"  Affect:  Appropriate and Congruent  Thought Process:  Coherent  Orientation:  Full (Time, Place, and Person)  Thought Content:  WDL  Suicidal Thoughts:  No  Homicidal Thoughts:  No  Memory:  Immediate;   Good Recent;   Good  Judgement:  Fair  Insight:  Fair  Psychomotor Activity:  Normal  Concentration:  Concentration: Fair  Recall:  Good  Fund of Knowledge:  Fair  Language:  Good  Akathisia:  No  Handed:  Right  AIMS (if indicated):     Assets:  Communication Skills Desire for Improvement  ADL's:  Intact  Cognition:  WNL  Sleep:       Mental Status Per Nursing Assessment::   On Admission:     Demographic Factors:  Male and Caucasian  Loss Factors: NA  Historical Factors: Impulsivity  Risk Reduction Factors:   NA  Continued Clinical Symptoms:  Alcohol/Substance Abuse/Dependencies  Cognitive Features That Contribute To Risk:  None    Suicide Risk:  Minimal: No identifiable suicidal ideation.  Patients presenting with no risk factors but with morbid ruminations; may be classified as minimal risk based on the severity of the depressive symptoms  Follow-up Information    [facility] Go on 02/04/2016.   [facility]  [facility] Contact information: [address] [place] [address] [phone]           Plan Of Care/Follow-up recommendations:  Activity:  As tolerated Diet:  As tolerated  Rankin, Shuvon, NP 02/03/2016, 2:41 PM

## 2016-02-03 NOTE — Progress Notes (Signed)
CSW spoke with patient at bedside regarding following up with [facility]. Patient reported that he was familiar with [facility], CSW encouraged patient to follow up. CSW provided patient with handout with contact information for [facility]. CSW inquired if patient's had any questions, patient replied no.

## 2016-02-03 NOTE — ED Notes (Signed)
Pt discharged to home. DC instructions given. No concerns voiced. Pt encouraged to make f/u visit at [facility] tomorrow off [address]. Importance of meeting emphasized. Pt voiced understanding. Left unit ambulatorily accompanied by security guard. Left in stable condition. VWilliams,rn.

## 2016-02-03 NOTE — ED Notes (Signed)
Pt alert x4 sitter at the bedside, states he is SI and HI, pleasant watching t.v. will continue to monitor

## 2016-02-03 NOTE — ED Notes (Signed)
Made MD Delo aware of the pt's K 3.2 and NA 127, no orders given at this time, we will continue to monitor.
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
## 2016-02-13 ENCOUNTER — Encounter (HOSPITAL_COMMUNITY): Payer: Self-pay | Admitting: Emergency Medicine

## 2016-02-13 ENCOUNTER — Emergency Department (HOSPITAL_COMMUNITY)
Admission: EM | Admit: 2016-02-13 | Discharge: 2016-02-13 | Disposition: A | Discharge status: Home / Self Care | Payer: Self-pay | Attending: Emergency Medicine | Admitting: Emergency Medicine

## 2016-02-13 DIAGNOSIS — F333 Major depressive disorder, recurrent, severe with psychotic symptoms: Secondary | ICD-10-CM | POA: Insufficient documentation

## 2016-02-13 DIAGNOSIS — R45851 Suicidal ideations: Secondary | ICD-10-CM

## 2016-02-13 DIAGNOSIS — F17210 Nicotine dependence, cigarettes, uncomplicated: Secondary | ICD-10-CM | POA: Insufficient documentation

## 2016-02-13 DIAGNOSIS — F1020 Alcohol dependence, uncomplicated: Secondary | ICD-10-CM | POA: Insufficient documentation

## 2016-02-13 LAB — COMPREHENSIVE METABOLIC PANEL
ALBUMIN: 4.2 g/dL (ref 3.5–5.0)
ALT: 19 U/L (ref 17–63)
AST: 31 U/L (ref 15–41)
Alkaline Phosphatase: 65 U/L (ref 38–126)
Anion gap: 9 (ref 5–15)
BILIRUBIN TOTAL: 0.5 mg/dL (ref 0.3–1.2)
BUN: 5 mg/dL — AB (ref 6–20)
CHLORIDE: 103 mmol/L (ref 101–111)
CO2: 21 mmol/L — ABNORMAL LOW (ref 22–32)
CREATININE: 0.62 mg/dL (ref 0.61–1.24)
Calcium: 8.6 mg/dL — ABNORMAL LOW (ref 8.9–10.3)
GFR calc Af Amer: >60 mL/min (ref >60)
GLUCOSE: 79 mg/dL (ref 65–99)
POTASSIUM: 3.3 mmol/L — AB (ref 3.5–5.1)
Sodium: 133 mmol/L — ABNORMAL LOW (ref 135–145)
TOTAL PROTEIN: 7.1 g/dL (ref 6.5–8.1)

## 2016-02-13 LAB — RAPID URINE DRUG SCREEN, HOSP PERFORMED
AMPHETAMINES: NOT DETECTED
BARBITURATES: NOT DETECTED
BENZODIAZEPINES: NOT DETECTED
Cocaine: NOT DETECTED
Opiates: NOT DETECTED
Tetrahydrocannabinol: NOT DETECTED

## 2016-02-13 LAB — ACETAMINOPHEN LEVEL: Acetaminophen (Tylenol), Serum: <10 ug/mL — ABNORMAL LOW (ref 10–30)

## 2016-02-13 LAB — CBC
HEMATOCRIT: 40.5 % (ref 39.0–52.0)
Hemoglobin: 13.9 g/dL (ref 13.0–17.0)
MCH: 32.6 pg (ref 26.0–34.0)
MCHC: 34.3 g/dL (ref 30.0–36.0)
MCV: 94.8 fL (ref 78.0–100.0)
Platelets: 380 10*3/uL (ref 150–400)
RBC: 4.27 MIL/uL (ref 4.22–5.81)
RDW: 12.3 % (ref 11.5–15.5)
WBC: 7.1 10*3/uL (ref 4.0–10.5)

## 2016-02-13 LAB — ETHANOL: Alcohol, Ethyl (B): 135 mg/dL — ABNORMAL HIGH (ref <5)

## 2016-02-13 LAB — SALICYLATE LEVEL: Salicylate Lvl: <7 mg/dL (ref 2.8–30.0)

## 2016-02-13 MED ORDER — POTASSIUM CHLORIDE CRYS ER 20 MEQ PO TBCR
40.0000 meq | EXTENDED_RELEASE_TABLET | Freq: Once | ORAL | Status: AC
  Administered 2016-02-13: 40 meq via ORAL
  Filled 2016-02-13: qty 2

## 2016-02-13 NOTE — Discharge Instructions (Signed)
Follow up with out pt tx for depression

## 2016-02-13 NOTE — ED Notes (Signed)
Bed: [id number] Expected date:  Expected time:  Means of arrival:  Comments: Cleaned and ready for Jen's use

## 2016-02-13 NOTE — Progress Notes (Signed)
   02/13/16 0918  Clinical Encounter Type  Visited With Patient;Health care provider  Visit Type Initial;Psychological support;Spiritual support;Other (Comment)  Referral From Nurse;Social work  Consult/Referral To Chaplain  Spiritual Encounters  Spiritual Needs Other (Comment) (Clothing )  Stress Factors  Patient Stress Factors Other (Comment) Recruitment consultant(Clothing )  Advance Directives (For Healthcare)  Does Patient Have a Medical Advance Directive? No  Mental Health Advance Directives  Does Patient Have a Mental Health Advance Directive? No   I visited with the patient per referral by the social worker and the nurse who stated that the patient was in need of clothing.  Clothing was provided for the patient to be discharged in.   Please, contact Spiritual Care for further assistance.   Chaplain Clint BolderBrittany Alechia Lezama M.Div.

## 2016-02-13 NOTE — Progress Notes (Signed)
[facility] for primarily detox, gastritis or finger injury and 0 admissions in the last 6 months This ED CM has seen pt in May 2016 and September 2017 and provided uninsured resources Pt given MATCH letter in 12/13/15 to get medications Pt continues to be listed today without a pcp, no insurance coverage and an address for [place] (interactive resources center)  Pt [facility] an ED CP or [facility]
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
## 2016-02-13 NOTE — ED Notes (Signed)
Pt in shower.  

## 2016-02-13 NOTE — Progress Notes (Signed)
CSW was informed by Psychiatrist and NP that patient was in need of blankets. Spoke with patient at bedside regarding need of blankets. Patient stated he was in needs of blankets and clothing items: shirts, sweatshirts, pants, thermals, and socks. Spoke with [facility] and she stated they did not have blankets, however, she stated they have clothing they can provide for patient. Spoke with Nurse and she stated patient could be provided a blanket. Psychiatry Team made aware.    Posey ReaLaVonia Deborahann Poteat, LCSWA Clinical Social Worker [phone] 10:43 AM

## 2016-02-17 ENCOUNTER — Encounter (HOSPITAL_COMMUNITY): Payer: Self-pay | Admitting: Emergency Medicine

## 2016-02-17 ENCOUNTER — Emergency Department (HOSPITAL_COMMUNITY)
Admission: EM | Admit: 2016-02-17 | Discharge: 2016-02-18 | Disposition: A | Discharge status: Home / Self Care | Payer: Federal, State, Local not specified - Other | Attending: Emergency Medicine | Admitting: Emergency Medicine

## 2016-02-17 DIAGNOSIS — F17210 Nicotine dependence, cigarettes, uncomplicated: Secondary | ICD-10-CM | POA: Insufficient documentation

## 2016-02-17 DIAGNOSIS — F1020 Alcohol dependence, uncomplicated: Secondary | ICD-10-CM

## 2016-02-17 DIAGNOSIS — F918 Other conduct disorders: Secondary | ICD-10-CM | POA: Insufficient documentation

## 2016-02-17 DIAGNOSIS — R45850 Homicidal ideations: Secondary | ICD-10-CM

## 2016-02-17 DIAGNOSIS — R4689 Other symptoms and signs involving appearance and behavior: Secondary | ICD-10-CM

## 2016-02-17 DIAGNOSIS — F1094 Alcohol use, unspecified with alcohol-induced mood disorder: Secondary | ICD-10-CM

## 2016-02-17 DIAGNOSIS — F1010 Alcohol abuse, uncomplicated: Secondary | ICD-10-CM

## 2016-02-17 DIAGNOSIS — F1024 Alcohol dependence with alcohol-induced mood disorder: Secondary | ICD-10-CM | POA: Insufficient documentation

## 2016-02-17 DIAGNOSIS — Z5181 Encounter for therapeutic drug level monitoring: Secondary | ICD-10-CM | POA: Insufficient documentation

## 2016-02-17 LAB — RAPID URINE DRUG SCREEN, HOSP PERFORMED
AMPHETAMINES: NOT DETECTED
BARBITURATES: NOT DETECTED
BENZODIAZEPINES: NOT DETECTED
COCAINE: NOT DETECTED
Opiates: NOT DETECTED
TETRAHYDROCANNABINOL: NOT DETECTED

## 2016-02-17 LAB — COMPREHENSIVE METABOLIC PANEL
ALBUMIN: 4.2 g/dL (ref 3.5–5.0)
ALT: 19 U/L (ref 17–63)
ANION GAP: 9 (ref 5–15)
AST: 35 U/L (ref 15–41)
Alkaline Phosphatase: 62 U/L (ref 38–126)
BILIRUBIN TOTAL: 0.7 mg/dL (ref 0.3–1.2)
BUN: 8 mg/dL (ref 6–20)
CHLORIDE: 102 mmol/L (ref 101–111)
CO2: 25 mmol/L (ref 22–32)
Calcium: 8.7 mg/dL — ABNORMAL LOW (ref 8.9–10.3)
Creatinine, Ser: 0.85 mg/dL (ref 0.61–1.24)
GFR calc Af Amer: >60 mL/min (ref >60)
GFR calc non Af Amer: >60 mL/min (ref >60)
GLUCOSE: 85 mg/dL (ref 65–99)
POTASSIUM: 4.3 mmol/L (ref 3.5–5.1)
SODIUM: 136 mmol/L (ref 135–145)
TOTAL PROTEIN: 7.9 g/dL (ref 6.5–8.1)

## 2016-02-17 LAB — CBC
HEMATOCRIT: 40.6 % (ref 39.0–52.0)
HEMOGLOBIN: 14.1 g/dL (ref 13.0–17.0)
MCH: 33.3 pg (ref 26.0–34.0)
MCHC: 34.7 g/dL (ref 30.0–36.0)
MCV: 95.8 fL (ref 78.0–100.0)
Platelets: 348 10*3/uL (ref 150–400)
RBC: 4.24 MIL/uL (ref 4.22–5.81)
RDW: 12.6 % (ref 11.5–15.5)
WBC: 7.8 10*3/uL (ref 4.0–10.5)

## 2016-02-17 LAB — ETHANOL: Alcohol, Ethyl (B): 142 mg/dL — ABNORMAL HIGH (ref <5)

## 2016-02-17 LAB — SALICYLATE LEVEL: Salicylate Lvl: <7 mg/dL (ref 2.8–30.0)

## 2016-02-17 LAB — ACETAMINOPHEN LEVEL

## 2016-02-17 MED ORDER — ACETAMINOPHEN 325 MG PO TABS: 650.0000 mg | ORAL_TABLET | ORAL | Status: DC | PRN

## 2016-02-17 MED ORDER — VITAMIN B-1 100 MG PO TABS
100.0000 mg | ORAL_TABLET | Freq: Every day | ORAL | Status: DC
  Administered 2016-02-18: 100 mg via ORAL
  Filled 2016-02-17: qty 1

## 2016-02-17 MED ORDER — FOLIC ACID 1 MG PO TABS
1.0000 mg | ORAL_TABLET | Freq: Every day | ORAL | Status: DC
  Administered 2016-02-18: 1 mg via ORAL
  Filled 2016-02-17: qty 1

## 2016-02-17 MED ORDER — LORAZEPAM 1 MG PO TABS: 0.0000 mg | ORAL_TABLET | Freq: Two times a day (BID) | ORAL | Status: DC

## 2016-02-17 MED ORDER — LORAZEPAM 1 MG PO TABS
0.0000 mg | ORAL_TABLET | Freq: Four times a day (QID) | ORAL | Status: DC
Start: 2016-02-17 — End: 2016-02-18

## 2016-02-17 MED ORDER — LORAZEPAM 1 MG PO TABS: 1.0000 mg | ORAL_TABLET | Freq: Four times a day (QID) | ORAL | Status: DC | PRN

## 2016-02-17 MED ORDER — ALUM & MAG HYDROXIDE-SIMETH 200-200-20 MG/5ML PO SUSP: 30.0000 mL | ORAL | Status: DC | PRN

## 2016-02-17 MED ORDER — FAMOTIDINE 20 MG PO TABS
20.0000 mg | ORAL_TABLET | Freq: Two times a day (BID) | ORAL | Status: DC
Start: 2016-02-17 — End: 2016-02-18
  Administered 2016-02-18: 20 mg via ORAL
  Filled 2016-02-17: qty 1

## 2016-02-17 MED ORDER — LORAZEPAM 2 MG/ML IJ SOLN: 1.0000 mg | Freq: Four times a day (QID) | INTRAMUSCULAR | Status: DC | PRN

## 2016-02-17 MED ORDER — ADULT MULTIVITAMIN W/MINERALS CH
1.0000 | ORAL_TABLET | Freq: Every day | ORAL | Status: DC
  Administered 2016-02-18: 1 via ORAL
  Filled 2016-02-17: qty 1

## 2016-02-17 MED ORDER — THIAMINE HCL 100 MG/ML IJ SOLN: 100.0000 mg | Freq: Every day | INTRAMUSCULAR | Status: DC

## 2016-02-17 NOTE — ED Notes (Signed)
[employer] here serving paper on pt.
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
## 2016-02-17 NOTE — ED Triage Notes (Signed)
Pt comes by [employer] with complaints of SI/HI. Pt cooperative in route.  Homeless and hx of alcohol use. Comes with large trash bags and book bag of belongings.

## 2016-02-17 NOTE — ED Notes (Signed)
SBAR Report received from previous nurse. Pt received calm and visible on unit. Pt denies current SI/ HI, A/V H, depression, anxiety or pain at this time, and is otherwise stable. Pt reminded of camera surveillance, q 15 min rounds, and rules of the milieu. Pt screened for contraband by Clinical research associatewriter, will continue to assess.

## 2016-02-18 DIAGNOSIS — Z841 Family history of disorders of kidney and ureter: Secondary | ICD-10-CM

## 2016-02-18 DIAGNOSIS — F1094 Alcohol use, unspecified with alcohol-induced mood disorder: Secondary | ICD-10-CM

## 2016-02-18 DIAGNOSIS — Z811 Family history of alcohol abuse and dependence: Secondary | ICD-10-CM | POA: Diagnosis not present

## 2016-02-18 DIAGNOSIS — Z9889 Other specified postprocedural states: Secondary | ICD-10-CM

## 2016-02-18 DIAGNOSIS — Z79899 Other long term (current) drug therapy: Secondary | ICD-10-CM

## 2016-02-18 DIAGNOSIS — F17210 Nicotine dependence, cigarettes, uncomplicated: Secondary | ICD-10-CM

## 2016-02-18 NOTE — BH Assessment (Signed)
[facility] Assessment Progress Note  Per Thedore MinsMojeed Akintayo, MD, this pt does not require psychiatric hospitalization at this time.  Pt presents under IVC initiated by EDP Arby BarretteMarcy Pfeiffer, MD, which Dr Jannifer FranklinAkintayo has rescinded.  [facility] with referral information for resources for the homeless.  Included in pt's discharge instructions is information for [facility], [facility], [facility], [facility], and [facility].  [name] nurse, Diane, has been notified.  Doylene Canninghomas Ammara Raj, MA Triage Specialist [phone]

## 2016-02-18 NOTE — Discharge Instructions (Signed)
For your shelter needs, contact the following service providers:       [facility]      [address]      [place], [place] [address]      [phone]       [facility]      [address]      [place], [place] [address]      [phone]  For day shelter and other supportive services for the homeless, contact the [facility]):       [facility]      [address]      [place], [place] [address]      [phone]  For transitional housing, contact one of the following agencies.  They provide longer term housing than a shelter, but there is an application process:       [facility]      [address]      [place], [place] [address]      [phone]       [facility]      [facility]      [address]      [place], [place] [address]      [phone]
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
## 2016-02-18 NOTE — [facility] Suicide Risk Assessment (Signed)
Suicide Risk Assessment  Discharge Assessment   [facility]   Principal Problem: Alcohol-induced mood disorder [facility](HCC) Discharge Diagnoses:  Patient Active Problem List   Diagnosis Date Noted  . Alcohol-induced mood disorder ([facility]) [F10.94] 06/29/2015    Priority: High  . Alcohol use disorder, severe, dependence ([facility]CC) [F10.20] 06/11/2015    Priority: High  . Alcohol abuse [F10.10] 12/05/2013    Priority: High  . Alcohol abuse with alcohol-induced mood disorder (HCC) [F10.14] 01/09/2016  . Homicidal ideation [R45.850]   . Severe alcohol use disorder ([facility]) [F10.20] 06/24/2015  . Substance induced mood disorder ([facility]CC) [F19.94] 06/24/2015  . 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 Fracture of bone [T14.8XXA] 06/11/2015  . Polysubstance abuse [F19.10] 09/06/2014  . GIB (gastrointestinal bleeding) [K92.2] 09/06/2014  . Homeless [Z59.0] 09/06/2014  . GERD (gastroesophageal reflux disease) [K21.9]   . PUD (peptic ulcer disease) [K27.9]   . Gastroesophageal reflux disease without esophagitis [K21.9]   . Hypokalemia [E87.6]   . S/P alcohol detoxification [Z09] 12/01/2013  . Seizure (HCC) [R56.9] 08/24/2013  . Tobacco use disorder [F17.200] 08/19/2013    Total Time spent with patient: 45 minutes  Musculoskeletal: Strength & Muscle Tone: within normal limits Gait & Station: normal Patient leans: N/A  Psychiatric Specialty Exam: Physical Exam  Constitutional: He is oriented to person, place, and time. He appears well-developed and well-nourished.  HENT:  Head: Normocephalic.  Neck: Normal range of motion.  Respiratory: Effort normal.  Musculoskeletal: Normal range of motion.  Neurological: He is alert and oriented to person, place, and time.  Psychiatric: He has a normal mood and affect. His speech is normal and behavior is normal. Judgment and thought content normal. Cognition  and memory are normal.    Review of Systems  Constitutional: Negative.   HENT: Negative.   Eyes: Negative.   Respiratory: Negative.   Cardiovascular: Negative.   Gastrointestinal: Negative.   Genitourinary: Negative.   Musculoskeletal: Negative.   Skin: Negative.   Neurological: Negative.   Endo/Heme/Allergies: Negative.   Psychiatric/Behavioral: Positive for substance abuse.    Blood pressure 132/89, pulse 87, temperature 98.3 F (36.8 C), temperature source Oral, resp. rate 16, height 5\' 6"  (1.676 m), weight 68 kg (150 lb), SpO2 100 %.Body mass index is 24.21 kg/m.  General Appearance: Disheveled  Eye Contact:  Good  Speech:  Normal Rate  Volume:  Normal  Mood:  Irritable  Affect:  Congruent  Thought Process:  Coherent and Descriptions of Associations: Intact  Orientation:  Full (Time, Place, and Person)  Thought Content:  WDL  Suicidal Thoughts:  No  Homicidal Thoughts:  No  Memory:  Immediate;   Good Recent;   Good Remote;   Good  Judgement:  Fair  Insight:  Fair  Psychomotor Activity:  Normal  Concentration:  Concentration: Fair and Attention Span: Good  Recall:  Good  Fund of Knowledge:  Fair  Language:  Good  Akathisia:  No  Handed:  Right  AIMS (if indicated):     Assets:  Leisure Time Physical Health Resilience  ADL's:  Intact  Cognition:  WNL  Sleep:      Mental Status Per Nursing Assessment::   On Admission:   alcohol abuse with suicidal/homicidal ideations  Demographic Factors:  Living alone  Loss Factors: NA  Historical Factors: NA  Risk Reduction Factors:   Positive therapeutic relationship  Continued Clinical Symptoms:  None  Cognitive Features That Contribute To Risk:  None    Suicide Risk:  Minimal: No identifiable suicidal ideation.  Patients presenting with no risk factors but with morbid ruminations; may be classified as minimal risk based on the severity of the depressive symptoms    Plan Of Care/Follow-up  recommendations:  Activity:  as tolerated  Diet:  heart healthy diet  LORD, JAMISON, NP 02/18/2016, 11:14 AM

## 2016-02-18 NOTE — ED Notes (Signed)
Pt discharged home. Discharged instructions read to pt who verbalized understanding. All belongings returned to pt who signed for same. Denies SI/HI, is not delusional and not responding to internal stimuli. Escorted pt to the ED exit.    

## 2016-02-18 NOTE — ED Notes (Signed)
Pt had 1 eppisode of emesis at this time. When further assessed pt endorsed that he drank large amount of water in short time. Pt denies any nausea at this time.

## 2016-02-18 NOTE — Progress Notes (Signed)
Pt familiar with [facility], [facility]  Pt asking about medications Cm reminded him he received a MATCH letter from this CM in September 2017 and will not receive another Pt voiced understanding Cm encouraged use of [facility] for access to medications Pt discussed having to pay for them at these facilities with [facility] RN present CM informed pt yes he would have to pay for his medications a [facility] and [facility] Informed him there is a charge for medications  CM spoke with pt who confirms [place] an [place]cp.  CM discussed and provided written information to assist pt with determining choice for uninsured accepting pcps, discussed the importance of pcp vs EDP services for f/u care, www.needymeds.org, www.goodrx.com, discounted pharmacies and other [place] resources such as [facility] , [facility], affordable care act, financial assistance, uninsured dental services, [place] med assist, DSS and  health department  Reviewed resources for [place] uninsured accepting pcps like [facility], [facility] [address], [facility], [facility], local urgent care centers, [facility], [facility], general medical clinics, [facility] [place], [facility] plus others, medication resources, [facility] out patient pharmacies and housing Pt voiced understanding and appreciation of resources provided   Provided [facility] contact information as he was leaving with [facility] RN for D/c

## 2016-02-24 ENCOUNTER — Emergency Department (HOSPITAL_COMMUNITY)
Admission: EM | Admit: 2016-02-24 | Discharge: 2016-02-25 | Disposition: A | Discharge status: Home / Self Care | Payer: Self-pay | Attending: Emergency Medicine | Admitting: Emergency Medicine

## 2016-02-24 ENCOUNTER — Encounter (HOSPITAL_COMMUNITY): Payer: Self-pay | Admitting: *Deleted

## 2016-02-24 DIAGNOSIS — K2920 Alcoholic gastritis without bleeding: Secondary | ICD-10-CM | POA: Insufficient documentation

## 2016-02-24 DIAGNOSIS — F17210 Nicotine dependence, cigarettes, uncomplicated: Secondary | ICD-10-CM | POA: Insufficient documentation

## 2016-02-24 LAB — COMPREHENSIVE METABOLIC PANEL WITH GFR
ALT: 25 U/L (ref 17–63)
AST: 40 U/L (ref 15–41)
Albumin: 4.3 g/dL (ref 3.5–5.0)
Alkaline Phosphatase: 67 U/L (ref 38–126)
Anion gap: 13 (ref 5–15)
BUN: 10 mg/dL (ref 6–20)
CO2: 25 mmol/L (ref 22–32)
Calcium: 10.7 mg/dL — ABNORMAL HIGH (ref 8.9–10.3)
Chloride: 95 mmol/L — ABNORMAL LOW (ref 101–111)
Creatinine, Ser: 0.7 mg/dL (ref 0.61–1.24)
GFR calc Af Amer: >60 mL/min
GFR calc non Af Amer: >60 mL/min
Glucose, Bld: 109 mg/dL — ABNORMAL HIGH (ref 65–99)
Potassium: 3.6 mmol/L (ref 3.5–5.1)
Sodium: 133 mmol/L — ABNORMAL LOW (ref 135–145)
Total Bilirubin: 0.7 mg/dL (ref 0.3–1.2)
Total Protein: 8.3 g/dL — ABNORMAL HIGH (ref 6.5–8.1)

## 2016-02-24 LAB — CBC
HCT: 41.4 % (ref 39.0–52.0)
Hemoglobin: 14.7 g/dL (ref 13.0–17.0)
MCH: 33.2 pg (ref 26.0–34.0)
MCHC: 35.5 g/dL (ref 30.0–36.0)
MCV: 93.5 fL (ref 78.0–100.0)
PLATELETS: 299 10*3/uL (ref 150–400)
RBC: 4.43 MIL/uL (ref 4.22–5.81)
RDW: 12.5 % (ref 11.5–15.5)
WBC: 6.6 10*3/uL (ref 4.0–10.5)

## 2016-02-24 LAB — URINALYSIS, ROUTINE W REFLEX MICROSCOPIC
Bilirubin Urine: NEGATIVE
GLUCOSE, UA: NEGATIVE mg/dL
HGB URINE DIPSTICK: NEGATIVE
Ketones, ur: NEGATIVE mg/dL
LEUKOCYTES UA: NEGATIVE
Nitrite: NEGATIVE
Protein, ur: NEGATIVE mg/dL
SPECIFIC GRAVITY, URINE: 1.019 (ref 1.005–1.030)
pH: 7.5 (ref 5.0–8.0)

## 2016-02-24 LAB — LIPASE, BLOOD: Lipase: 47 U/L (ref 11–51)

## 2016-02-24 MED ORDER — GI COCKTAIL ~~LOC~~
30.0000 mL | Freq: Once | ORAL | Status: AC
  Administered 2016-02-25: 30 mL via ORAL
  Filled 2016-02-24: qty 30

## 2016-02-24 NOTE — ED Notes (Signed)
Bed: [id number] Expected date:  Expected time:  Means of arrival:  Comments: 60yo M epigastric pain
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
## 2016-02-24 NOTE — ED Notes (Signed)
Pt aware that a urine sample is needed. Pt currently has a urinal and is attempting to provide sample.

## 2016-02-24 NOTE — ED Triage Notes (Signed)
EMS reports pt has developed worsening epigastric pain over the last 3 days, n/v no fever or diarrhea, denies ETOH. VS 135/85-108-18-99% RA CBG 81

## 2016-02-25 MED ORDER — OMEPRAZOLE 20 MG PO CPDR: 20.0000 mg | DELAYED_RELEASE_CAPSULE | Freq: Every day | ORAL | 0 refills | Status: DC

## 2016-03-07 ENCOUNTER — Encounter (HOSPITAL_COMMUNITY): Payer: Self-pay | Admitting: *Deleted

## 2016-03-07 ENCOUNTER — Emergency Department (HOSPITAL_COMMUNITY)
Admission: EM | Admit: 2016-03-07 | Discharge: 2016-03-07 | Disposition: A | Discharge status: Home / Self Care | Payer: Self-pay | Attending: Emergency Medicine | Admitting: Emergency Medicine

## 2016-03-07 DIAGNOSIS — Y9389 Activity, other specified: Secondary | ICD-10-CM | POA: Insufficient documentation

## 2016-03-07 DIAGNOSIS — S41112A Laceration without foreign body of left upper arm, initial encounter: Secondary | ICD-10-CM | POA: Insufficient documentation

## 2016-03-07 DIAGNOSIS — Y929 Unspecified place or not applicable: Secondary | ICD-10-CM | POA: Insufficient documentation

## 2016-03-07 DIAGNOSIS — Y999 Unspecified external cause status: Secondary | ICD-10-CM | POA: Insufficient documentation

## 2016-03-07 DIAGNOSIS — F17210 Nicotine dependence, cigarettes, uncomplicated: Secondary | ICD-10-CM | POA: Insufficient documentation

## 2016-03-07 MED ORDER — BACITRACIN ZINC 500 UNIT/GM EX OINT: 1.0000 "application " | TOPICAL_OINTMENT | Freq: Two times a day (BID) | CUTANEOUS | 0 refills | Status: DC
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
## 2016-03-07 NOTE — Discharge Instructions (Signed)
Staples out in 5-7 days.
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
## 2016-03-28 ENCOUNTER — Encounter (HOSPITAL_COMMUNITY): Payer: Self-pay

## 2016-03-28 ENCOUNTER — Emergency Department (HOSPITAL_COMMUNITY)
Admission: EM | Admit: 2016-03-28 | Discharge: 2016-03-28 | Disposition: A | Discharge status: Home / Self Care | Payer: Self-pay | Attending: Emergency Medicine | Admitting: Emergency Medicine

## 2016-03-28 DIAGNOSIS — F17210 Nicotine dependence, cigarettes, uncomplicated: Secondary | ICD-10-CM | POA: Insufficient documentation

## 2016-03-28 DIAGNOSIS — Z79899 Other long term (current) drug therapy: Secondary | ICD-10-CM | POA: Insufficient documentation

## 2016-03-28 DIAGNOSIS — Z4802 Encounter for removal of sutures: Secondary | ICD-10-CM | POA: Insufficient documentation

## 2016-03-28 MED ORDER — OMEPRAZOLE 20 MG PO CPDR: 20.0000 mg | DELAYED_RELEASE_CAPSULE | Freq: Every day | ORAL | 0 refills | Status: DC
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
## 2016-06-13 ENCOUNTER — Emergency Department (HOSPITAL_COMMUNITY)
Admission: EM | Admit: 2016-06-13 | Discharge: 2016-06-14 | Disposition: A | Discharge status: Home / Self Care | Payer: Self-pay | Attending: Emergency Medicine | Admitting: Emergency Medicine

## 2016-06-13 ENCOUNTER — Encounter (HOSPITAL_COMMUNITY): Payer: Self-pay

## 2016-06-13 DIAGNOSIS — K529 Noninfective gastroenteritis and colitis, unspecified: Secondary | ICD-10-CM | POA: Insufficient documentation

## 2016-06-13 DIAGNOSIS — F17210 Nicotine dependence, cigarettes, uncomplicated: Secondary | ICD-10-CM | POA: Insufficient documentation

## 2016-06-13 LAB — I-STAT CHEM 8, ED
BUN: 7 mg/dL (ref 6–20)
CREATININE: 0.7 mg/dL (ref 0.61–1.24)
Calcium, Ion: 1.13 mmol/L — ABNORMAL LOW (ref 1.15–1.40)
Chloride: 104 mmol/L (ref 101–111)
Glucose, Bld: 91 mg/dL (ref 65–99)
HEMATOCRIT: 34 % — AB (ref 39.0–52.0)
HEMOGLOBIN: 11.6 g/dL — AB (ref 13.0–17.0)
POTASSIUM: 3.5 mmol/L (ref 3.5–5.1)
SODIUM: 137 mmol/L (ref 135–145)
TCO2: 20 mmol/L (ref 0–100)

## 2016-06-13 MED ORDER — DICYCLOMINE HCL 10 MG/ML IM SOLN
20.0000 mg | Freq: Once | INTRAMUSCULAR | Status: AC
  Administered 2016-06-13: 20 mg via INTRAMUSCULAR
  Filled 2016-06-13: qty 2

## 2016-06-13 MED ORDER — GI COCKTAIL ~~LOC~~
30.0000 mL | Freq: Once | ORAL | Status: AC
  Administered 2016-06-13: 30 mL via ORAL
  Filled 2016-06-13: qty 30

## 2016-06-13 MED ORDER — ONDANSETRON 8 MG PO TBDP
8.0000 mg | ORAL_TABLET | Freq: Once | ORAL | Status: AC
  Administered 2016-06-13: 8 mg via ORAL
  Filled 2016-06-13: qty 1

## 2016-06-13 NOTE — ED Notes (Signed)
Bed: [facility]14 Expected date:  Expected time:  Means of arrival:  Comments: diarrhea 

## 2016-06-13 NOTE — ED Notes (Signed)
Pt states he's had a few beers tonight but he's unable to keep any food done, states that he's had diarrhea for a couple of days
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
## 2016-06-14 MED ORDER — ONDANSETRON 8 MG PO TBDP: ORAL_TABLET | ORAL | 0 refills | Status: DC

## 2016-06-14 MED ORDER — DICYCLOMINE HCL 20 MG PO TABS: 20.0000 mg | ORAL_TABLET | Freq: Two times a day (BID) | ORAL | 0 refills | Status: DC

## 2016-06-14 NOTE — ED Notes (Signed)
Pt left before receiving d/c instructions and without signing.

## 2016-06-21 ENCOUNTER — Emergency Department (HOSPITAL_COMMUNITY)
Admission: EM | Admit: 2016-06-21 | Discharge: 2016-06-21 | Disposition: A | Discharge status: Home / Self Care | Payer: Self-pay | Attending: Emergency Medicine | Admitting: Emergency Medicine

## 2016-06-21 ENCOUNTER — Encounter (HOSPITAL_COMMUNITY): Payer: Self-pay | Admitting: Emergency Medicine

## 2016-06-21 DIAGNOSIS — F17210 Nicotine dependence, cigarettes, uncomplicated: Secondary | ICD-10-CM | POA: Insufficient documentation

## 2016-06-21 DIAGNOSIS — E860 Dehydration: Secondary | ICD-10-CM | POA: Insufficient documentation

## 2016-06-21 DIAGNOSIS — Z79899 Other long term (current) drug therapy: Secondary | ICD-10-CM | POA: Insufficient documentation

## 2016-06-21 DIAGNOSIS — F1010 Alcohol abuse, uncomplicated: Secondary | ICD-10-CM

## 2016-06-21 LAB — COMPREHENSIVE METABOLIC PANEL
ALBUMIN: 3.6 g/dL (ref 3.5–5.0)
ALK PHOS: 98 U/L (ref 38–126)
ALT: 34 U/L (ref 17–63)
ANION GAP: 9 (ref 5–15)
AST: 25 U/L (ref 15–41)
BUN: 13 mg/dL (ref 6–20)
CALCIUM: 8.8 mg/dL — AB (ref 8.9–10.3)
CO2: 19 mmol/L — AB (ref 22–32)
CREATININE: 0.66 mg/dL (ref 0.61–1.24)
Chloride: 106 mmol/L (ref 101–111)
GFR calc Af Amer: >60 mL/min (ref >60)
GFR calc non Af Amer: >60 mL/min (ref >60)
GLUCOSE: 89 mg/dL (ref 65–99)
Potassium: 4 mmol/L (ref 3.5–5.1)
SODIUM: 134 mmol/L — AB (ref 135–145)
Total Bilirubin: 0.3 mg/dL (ref 0.3–1.2)
Total Protein: 7.4 g/dL (ref 6.5–8.1)

## 2016-06-21 LAB — RAPID URINE DRUG SCREEN, HOSP PERFORMED
AMPHETAMINES: NOT DETECTED
BARBITURATES: NOT DETECTED
Benzodiazepines: NOT DETECTED
Cocaine: NOT DETECTED
Opiates: NOT DETECTED
Tetrahydrocannabinol: NOT DETECTED

## 2016-06-21 LAB — CBC
HCT: 33.9 % — ABNORMAL LOW (ref 39.0–52.0)
Hemoglobin: 11.8 g/dL — ABNORMAL LOW (ref 13.0–17.0)
MCH: 32.7 pg (ref 26.0–34.0)
MCHC: 34.8 g/dL (ref 30.0–36.0)
MCV: 93.9 fL (ref 78.0–100.0)
PLATELETS: 362 10*3/uL (ref 150–400)
RBC: 3.61 MIL/uL — AB (ref 4.22–5.81)
RDW: 13.9 % (ref 11.5–15.5)
WBC: 9.1 10*3/uL (ref 4.0–10.5)

## 2016-06-21 LAB — ETHANOL: Alcohol, Ethyl (B): 143 mg/dL — ABNORMAL HIGH (ref <5)

## 2016-06-21 NOTE — ED Notes (Signed)
Pt discharged  Pt belongings returned to pt  Pt given sand witch and meal  Pt given bus pass

## 2016-06-21 NOTE — ED Notes (Signed)
Bed: ZO10 Expected date:  Expected time:  Means of arrival:  Comments: [employer]/voluntary
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
## 2016-06-21 NOTE — Discharge Instructions (Signed)
Substance Abuse Treatment Programs ° °Intensive Outpatient Programs °[facility]     °[address]      °[place], [place]                   °[phone]      ° °[facility] °[address] °[place], [place] °[phone] ° ° Behavioral Health Outpatient     °(Inpatient and outpatient)     °[address]           °[phone]   ° °[facility] °[phone] (Suboxone and Methadone) ° °[address]      °[place], [place] [address]      °[phone]      ° °[address] °[place], [place] °852-3033 ° °[facility] (Outpatient/Inpatient, Chemical)    °(insurance only) [phone]      °       °[facility] (Groups & Residential) °[place], [place] °[phone] ° °   °[facility]     °[address]     °[place], [place]      °[phone]      ° °[facility] (for caregivers and family) °[address] °[place], [place] °336-299-4655 ° ° ° ° ° °Residential Treatment Programs °[facility]      °[address], [place], [place] [address]  °([phone]      ° °[facility] °[address] , [place] [address] °[phone] ° °[facility]        °336-248-8914      ° °[facility] °[phone] ° °[facility]             °[address]                                         °[place], [place]                                                °877-615-2722 or 336-784-9470                              ° °[facility] [place] °[address] °[place], [address] °[phone] ° °[facility]    °[address]      °[place], [place]     °[phone]      ° °[facility] °[address] °[place], [place] °336-285-9073 ° °[facility]   °[address]     °[place], [place] [address]     °[phone]      °Admissions: 8am-3pm M-F ° °[facility] °[address] °[place],  [place] °336-227-7417 ° °[facility]: Residential Program (90 Days)   °[place], [place]      °336-725-8389 or [phone]    ° °[facility] °[place], [place] °(Walk in Hours over the weekend or by referral) ° °[facility] °[address], [place],  [address] °([phone] ° °Crisis Mobile: [facility]:  1-877-626-1772 (for crisis response 24 hours a day) °[facility] Hotline:      1-800-256-2452 °Outpatient Psychiatry and Counseling ° °[facility]: Mobile Crisis   Management 24 hours:  1-877-626-1772 ° °[facility] sliding scale fee and walk in schedule: M-F 8am-12pm/1pm-3pm °[address]  °[place], [place] [address] °[phone] ° °[facility] °[address] °[place], [place] [address] °[phone] ° °[facility] (Former[facility])- new patient walk-in appointments available Monday - Friday 8am -3pm.          °[address] °[place], [place] [address] °336-676-6840 or crisis line- 336-676-6905 ° °[facility] Intensive Outpatient Therapy Program °[address] °[place], [place] [address] °336-832-9804 ° °[facility]                  °Crisis Services      °[phone]      °[address]     °[place], [place] [address]                ° °[facility]   °[facility] °[phone] °[address] °[place], [place] [address] ° ° °[facility]          °[address],  °[place], [place] [address]       °[phone] ° °[facility] °[address] °[place], [place] [address] °336-292-1510 ° °[facility]    °[address]    °[place], [place] [address]     °[phone]      ° °[facility] McKin[facility], [place]     °[address]     °[place] [address]     °336-282-1251     °  °[facility] °[address] °[place] [address] ° °[facility]     °[address]     °[place], [place]      °336-542-2076      ° °[facility] °Shamsher Ahluwalia, MD °[address] °[place], [place] [address] °[phone] ° °[facility]     °[address]     °[place], [place] [address]     °336-274-1237      ° °[facility] °[address] °[place], [place] [address] °336-854-4450 °Resources for Temporary Residential Assistance/Crisis Centers ° °DAY CENTERS °[facility] °M-F 8am-3pm   °[address] [place], [place] [address]   336-332-0824 °Services include: laundry, barbering, support groups, case management, phone  & computer access, showers, AA/NA mtgs, mental health/substance abuse nurse, job skills class, disability information, VA assistance, spiritual classes, etc.  ° °HOMELESS SHELTERS ° °[facility]     °[facility]   °[address], [place]     °[phone]       °       °[facility] (women and children)       °[address] °, [place] [address] °336-275-0820 °[email or web address] for application and process °Application Required ° °[facility]   °[address]    °[place] [address]     °[phone]       °             °[facility] °[address] °, [place] [address] °336.273.5572 °336-235-0363(schedule application appt.) °Application Required ° °[facility] (women only)    °[address]     °[place],  [address]     °336-884-1039      °  Intake starts 6pm daily °Need valid ID, SSC, & Police report °[facility] [place] °[address] °[place], [place] °336-881-5420 °Application Required ° °[facility] (men only)     °[address]      °[place], [place]     °[phone]      ° °[facility] °(Pregnant women only) °[address] °[place], [place] °336-275-0206 ° °[facility]      °[address]      °[place], [place] [address]     °[phone]      °       °[facility] °[address] °[place], [place] °336-723-1848 °90 day commitment/SA/Application process ° °[facility](men only)     °[address]     °[place], [place]     °[phone]       °Check-in at 7pm     °       °[facility] °[address] °[place], [place] [address] °336-248-6684 °Men/Women/Women and Children must be there by 7 pm ° °[facility] °[place], [place] °336-722-8721                ° °
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
## 2016-06-21 NOTE — ED Notes (Signed)
Provider in room  

## 2016-06-28 IMAGING — CT CT HEAD W/O CM
1 series · 16 of 30 positions shown, 20 images · non-contrast
Comparison: 08/23/2013

CLINICAL DATA: Seizure, striking head on a wall

EXAM:
CT HEAD WITHOUT CONTRAST
TECHNIQUE: Contiguous axial images were obtained from the base of the skull
through the vertex without intravenous contrast.

[Series 2: head_seq 4.5 h37s st · axial · 0.43mm/px · z∈[-127,+17]mm · 16 of 36 slices shown, 20 images]
[im 2/36  brain]
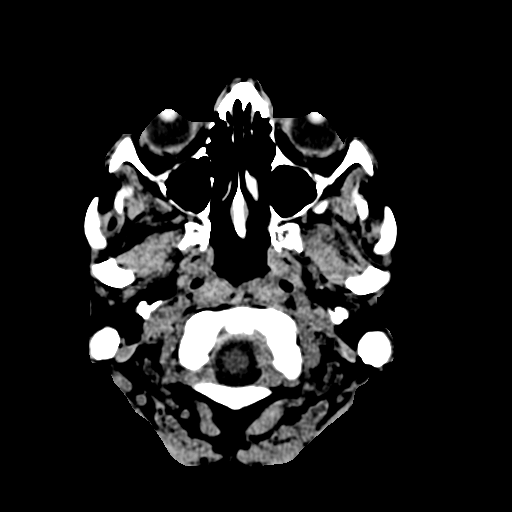
[im 2/36  bone]
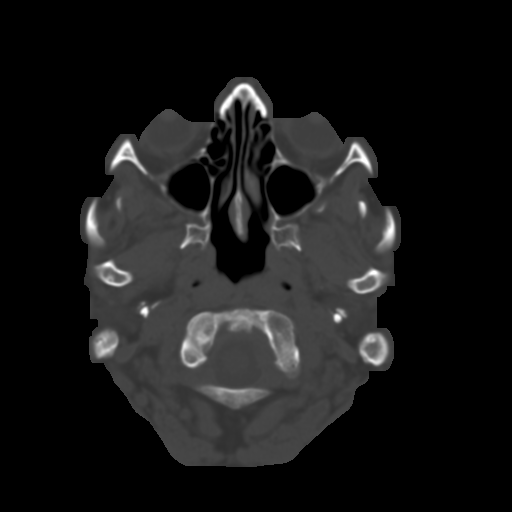
[im 4/36  brain]
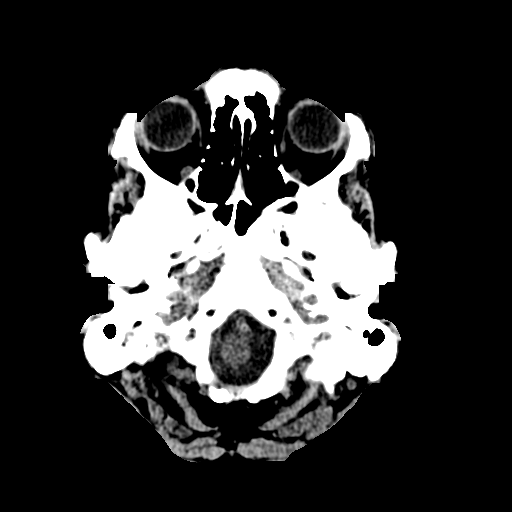
[im 7/36  brain]
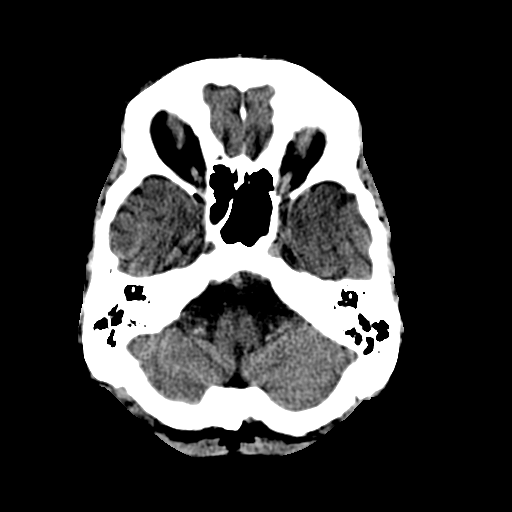
[im 9/36  brain]
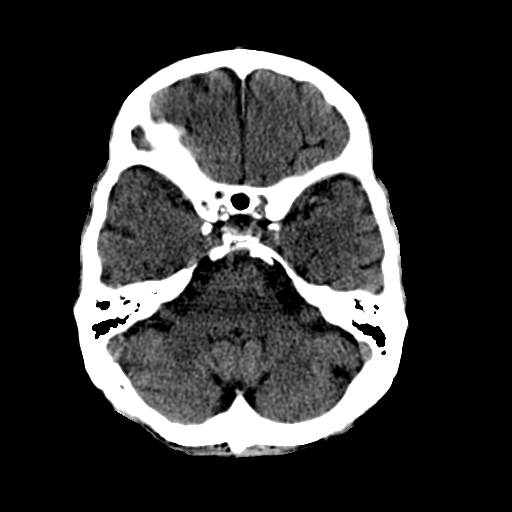
[im 10/36  brain]
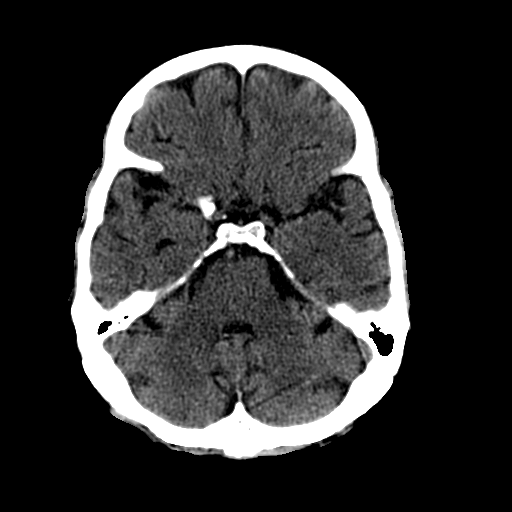
[im 10/36  bone]
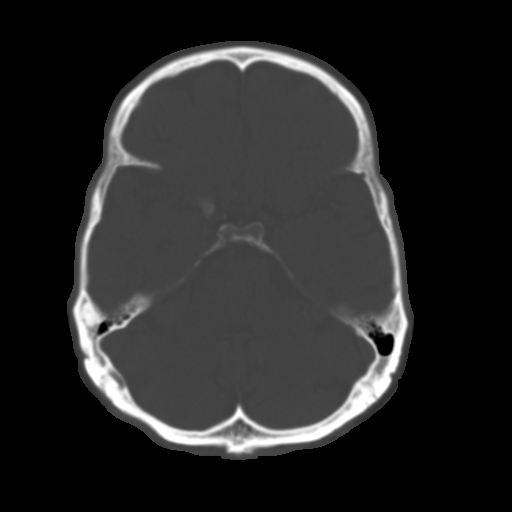
[im 13/36  brain]
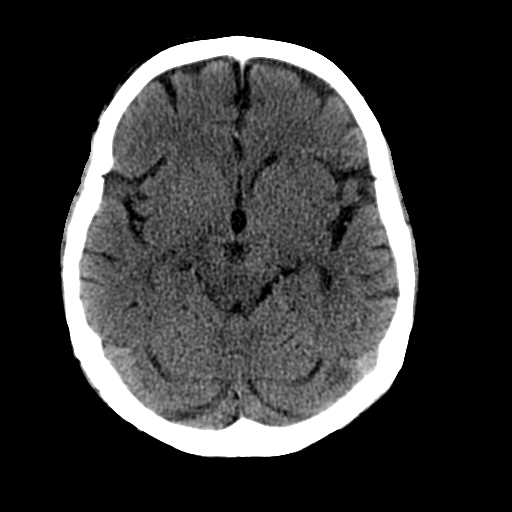
[im 15/36  brain]
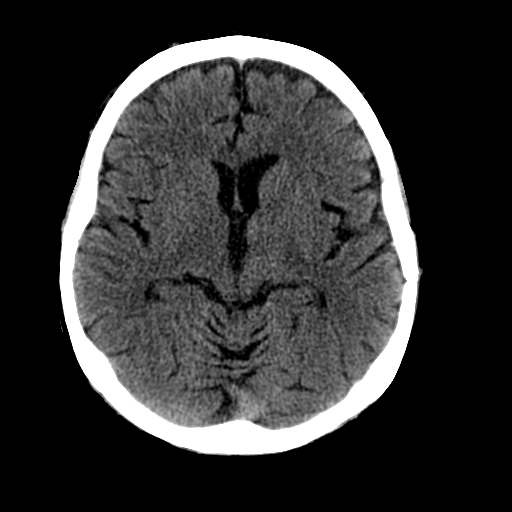
[im 17/36  brain]
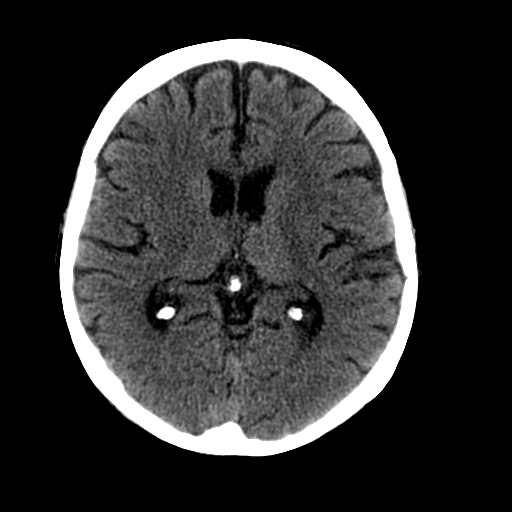
[im 19/36  brain]
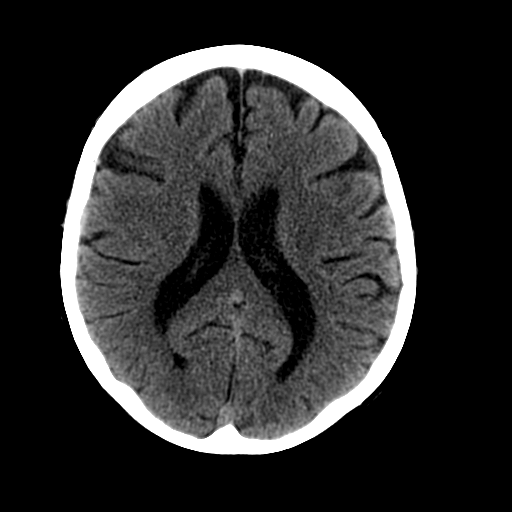
[im 19/36  bone]
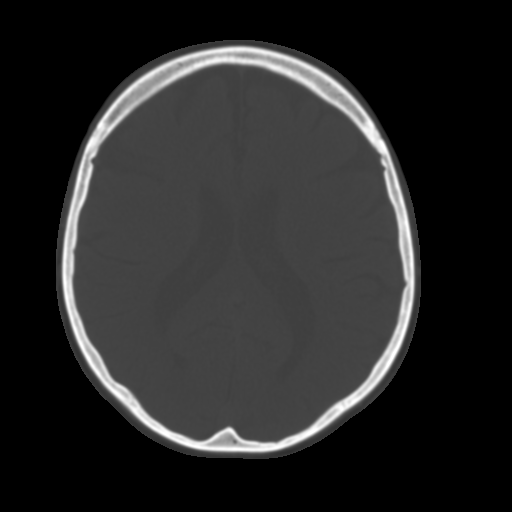
[im 21/36  brain]
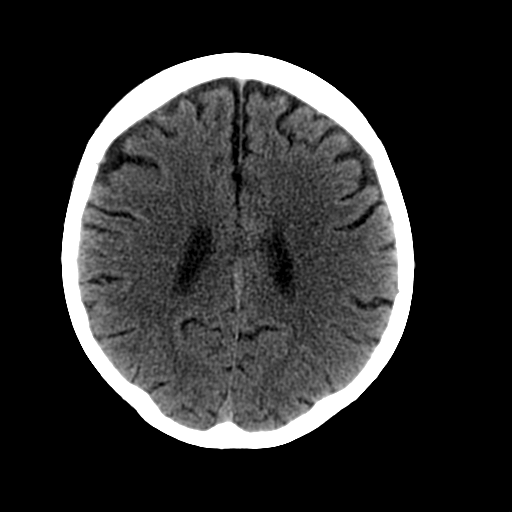
[im 23/36  brain]
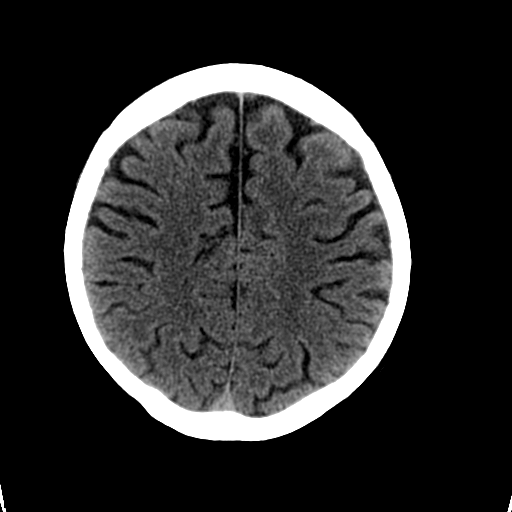
[im 26/36  brain]
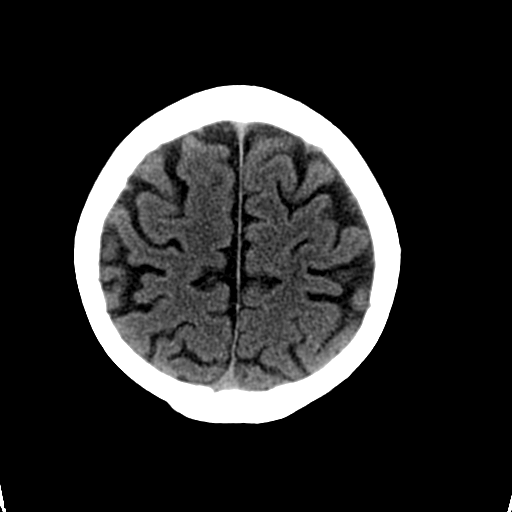
[im 27/36  brain]
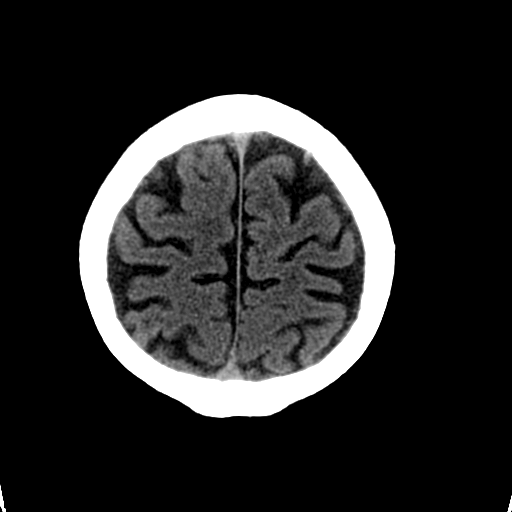
[im 27/36  bone]
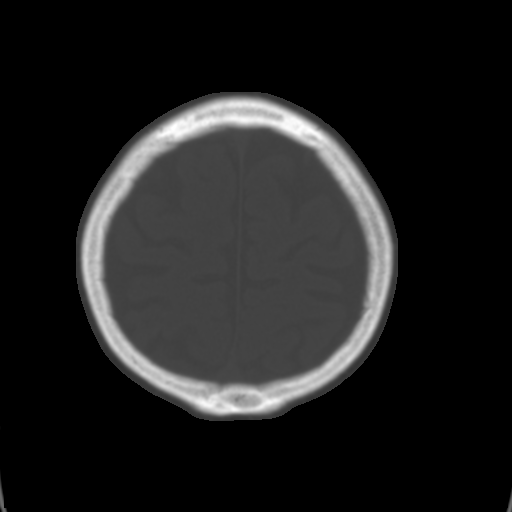
[im 29/36  brain]
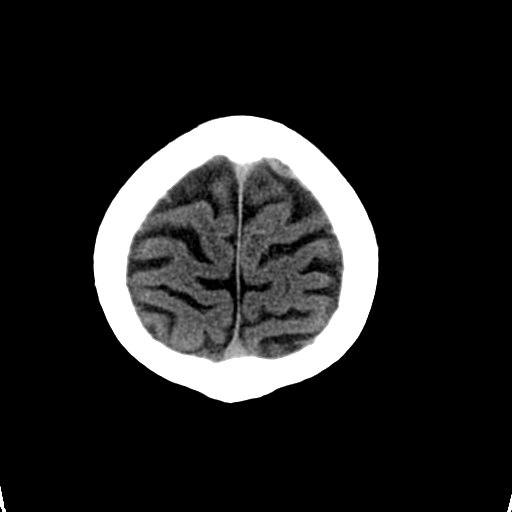
[im 32/36  brain]
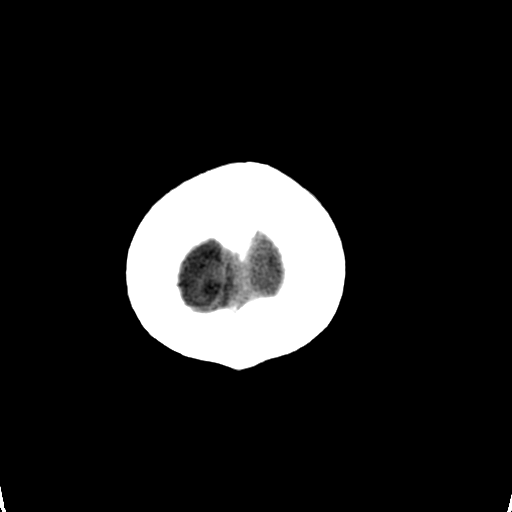
[im 34/36  brain]
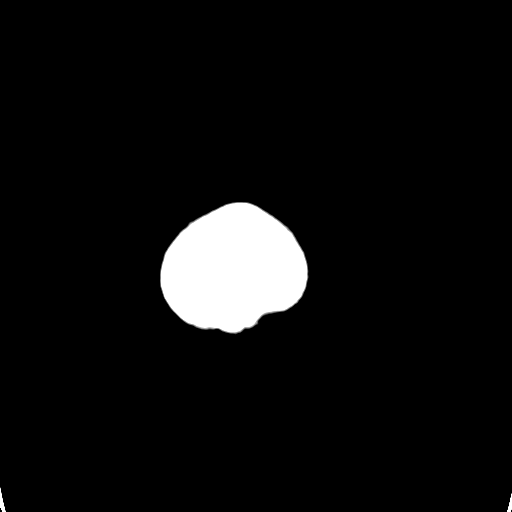

[16 of 30 positions shown; findings below may reference images not displayed]

FINDINGS: There is no intracranial hemorrhage, mass or evidence of acute
infarction. There is mild generalized atrophy. There is mild chronic
microvascular ischemic change. There is no significant extra-axial
fluid collection.

No acute intracranial findings are evident. Calvarium and skullbase
are intact.
IMPRESSION: Mild atrophy and chronic microvascular changes.  No acute findings.

## 2016-07-07 ENCOUNTER — Emergency Department (HOSPITAL_COMMUNITY)
Admission: EM | Admit: 2016-07-07 | Discharge: 2016-07-07 | Disposition: A | Discharge status: Home / Self Care | Payer: Self-pay | Attending: Emergency Medicine | Admitting: Emergency Medicine

## 2016-07-07 ENCOUNTER — Encounter (HOSPITAL_COMMUNITY): Payer: Self-pay

## 2016-07-07 DIAGNOSIS — R45851 Suicidal ideations: Secondary | ICD-10-CM | POA: Insufficient documentation

## 2016-07-07 DIAGNOSIS — F10920 Alcohol use, unspecified with intoxication, uncomplicated: Secondary | ICD-10-CM

## 2016-07-07 DIAGNOSIS — F10120 Alcohol abuse with intoxication, uncomplicated: Secondary | ICD-10-CM | POA: Insufficient documentation

## 2016-07-07 DIAGNOSIS — F17210 Nicotine dependence, cigarettes, uncomplicated: Secondary | ICD-10-CM | POA: Insufficient documentation

## 2016-07-07 LAB — COMPREHENSIVE METABOLIC PANEL
ALK PHOS: 81 U/L (ref 38–126)
ALT: 27 U/L (ref 17–63)
ANION GAP: 10 (ref 5–15)
AST: 40 U/L (ref 15–41)
Albumin: 4.3 g/dL (ref 3.5–5.0)
BILIRUBIN TOTAL: 0.3 mg/dL (ref 0.3–1.2)
BUN: 8 mg/dL (ref 6–20)
CALCIUM: 9.1 mg/dL (ref 8.9–10.3)
CO2: 24 mmol/L (ref 22–32)
Chloride: 100 mmol/L — ABNORMAL LOW (ref 101–111)
Creatinine, Ser: 0.66 mg/dL (ref 0.61–1.24)
GFR calc Af Amer: >60 mL/min (ref >60)
Glucose, Bld: 91 mg/dL (ref 65–99)
POTASSIUM: 3.7 mmol/L (ref 3.5–5.1)
Sodium: 134 mmol/L — ABNORMAL LOW (ref 135–145)
TOTAL PROTEIN: 8.4 g/dL — AB (ref 6.5–8.1)

## 2016-07-07 LAB — CBC
HCT: 38.7 % — ABNORMAL LOW (ref 39.0–52.0)
HEMOGLOBIN: 13.8 g/dL (ref 13.0–17.0)
MCH: 32.3 pg (ref 26.0–34.0)
MCHC: 35.7 g/dL (ref 30.0–36.0)
MCV: 90.6 fL (ref 78.0–100.0)
Platelets: 268 10*3/uL (ref 150–400)
RBC: 4.27 MIL/uL (ref 4.22–5.81)
RDW: 13.2 % (ref 11.5–15.5)
WBC: 6.8 10*3/uL (ref 4.0–10.5)

## 2016-07-07 LAB — ETHANOL: ALCOHOL ETHYL (B): 340 mg/dL — AB (ref <5)

## 2016-07-07 LAB — ACETAMINOPHEN LEVEL: Acetaminophen (Tylenol), Serum: <10 ug/mL — ABNORMAL LOW (ref 10–30)

## 2016-07-07 LAB — SALICYLATE LEVEL: Salicylate Lvl: <7 mg/dL (ref 2.8–30.0)

## 2016-07-07 NOTE — ED Triage Notes (Signed)
[employer] brought in pt with ETOH and states he is suicidal no other complaints voiced.

## 2016-07-07 NOTE — ED Notes (Signed)
Bed: [facility] Expected date:  Expected time:  Means of arrival:  Comments: 

## 2016-07-07 NOTE — ED Notes (Signed)
Bed: WTR5 Expected date:  Expected time:  Means of arrival:  Comments: 
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
## 2016-07-07 NOTE — ED Notes (Signed)
No respiratory or acute distress noted resting in bed with eyes closed call light in reach. 

## 2016-07-07 NOTE — ED Notes (Signed)
Date and time results received: 07/07/16 2:32 AM    Test:ETOH Critical Value: 340  Name of Provider Notified: Nan[place]iti  Orders Received? Or Actions Taken?:

## 2016-07-21 ENCOUNTER — Encounter (HOSPITAL_COMMUNITY): Payer: Self-pay | Admitting: Emergency Medicine

## 2016-07-21 DIAGNOSIS — F10220 Alcohol dependence with intoxication, uncomplicated: Secondary | ICD-10-CM | POA: Insufficient documentation

## 2016-07-21 DIAGNOSIS — M79671 Pain in right foot: Secondary | ICD-10-CM | POA: Insufficient documentation

## 2016-07-21 DIAGNOSIS — F17210 Nicotine dependence, cigarettes, uncomplicated: Secondary | ICD-10-CM | POA: Insufficient documentation

## 2016-07-21 DIAGNOSIS — M79672 Pain in left foot: Secondary | ICD-10-CM | POA: Insufficient documentation

## 2016-07-21 NOTE — ED Notes (Signed)
Did not respond when called 

## 2016-07-21 NOTE — ED Triage Notes (Signed)
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

## 2016-07-22 ENCOUNTER — Emergency Department (HOSPITAL_COMMUNITY)
Admission: EM | Admit: 2016-07-22 | Discharge: 2016-07-22 | Disposition: A | Discharge status: Home / Self Care | Payer: Self-pay | Attending: Emergency Medicine | Admitting: Emergency Medicine

## 2016-07-22 DIAGNOSIS — M79672 Pain in left foot: Secondary | ICD-10-CM

## 2016-07-22 DIAGNOSIS — M79671 Pain in right foot: Secondary | ICD-10-CM

## 2016-07-22 DIAGNOSIS — F10220 Alcohol dependence with intoxication, uncomplicated: Secondary | ICD-10-CM

## 2016-07-22 NOTE — Discharge Instructions (Signed)
You have been provided with resources to arrange outpatient alcohol treatment. Please call to make these arrangements.
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
## 2016-07-22 NOTE — ED Notes (Signed)
Patient feet are red. He states that they hurt. No injury.

## 2016-09-01 ENCOUNTER — Emergency Department (HOSPITAL_COMMUNITY)
Admission: EM | Admit: 2016-09-01 | Discharge: 2016-09-02 | Disposition: A | Discharge status: Home / Self Care | Payer: Self-pay | Attending: Emergency Medicine | Admitting: Emergency Medicine

## 2016-09-01 ENCOUNTER — Encounter (HOSPITAL_COMMUNITY): Payer: Self-pay | Admitting: Emergency Medicine

## 2016-09-01 DIAGNOSIS — F1094 Alcohol use, unspecified with alcohol-induced mood disorder: Secondary | ICD-10-CM | POA: Diagnosis present

## 2016-09-01 DIAGNOSIS — F1010 Alcohol abuse, uncomplicated: Secondary | ICD-10-CM

## 2016-09-01 DIAGNOSIS — F1020 Alcohol dependence, uncomplicated: Secondary | ICD-10-CM | POA: Diagnosis present

## 2016-09-01 DIAGNOSIS — F1014 Alcohol abuse with alcohol-induced mood disorder: Secondary | ICD-10-CM | POA: Insufficient documentation

## 2016-09-01 DIAGNOSIS — F17210 Nicotine dependence, cigarettes, uncomplicated: Secondary | ICD-10-CM | POA: Insufficient documentation

## 2016-09-01 DIAGNOSIS — Z5900 Homelessness unspecified: Secondary | ICD-10-CM

## 2016-09-01 DIAGNOSIS — F10920 Alcohol use, unspecified with intoxication, uncomplicated: Secondary | ICD-10-CM

## 2016-09-01 LAB — ACETAMINOPHEN LEVEL

## 2016-09-01 LAB — COMPREHENSIVE METABOLIC PANEL
ALK PHOS: 95 U/L (ref 38–126)
ALT: 14 U/L — ABNORMAL LOW (ref 17–63)
ANION GAP: 9 (ref 5–15)
AST: 37 U/L (ref 15–41)
Albumin: 3.9 g/dL (ref 3.5–5.0)
BUN: <5 mg/dL — ABNORMAL LOW (ref 6–20)
CALCIUM: 8.5 mg/dL — AB (ref 8.9–10.3)
CO2: 22 mmol/L (ref 22–32)
Chloride: 93 mmol/L — ABNORMAL LOW (ref 101–111)
Creatinine, Ser: 0.66 mg/dL (ref 0.61–1.24)
Glucose, Bld: 82 mg/dL (ref 65–99)
Potassium: 4.1 mmol/L (ref 3.5–5.1)
SODIUM: 124 mmol/L — AB (ref 135–145)
TOTAL PROTEIN: 7.6 g/dL (ref 6.5–8.1)

## 2016-09-01 LAB — RAPID URINE DRUG SCREEN, HOSP PERFORMED
Amphetamines: NOT DETECTED
Barbiturates: NOT DETECTED
Benzodiazepines: NOT DETECTED
COCAINE: NOT DETECTED
OPIATES: NOT DETECTED
Tetrahydrocannabinol: NOT DETECTED

## 2016-09-01 LAB — CBC
HCT: 38.2 % — ABNORMAL LOW (ref 39.0–52.0)
Hemoglobin: 13.5 g/dL (ref 13.0–17.0)
MCH: 32.5 pg (ref 26.0–34.0)
MCHC: 35.3 g/dL (ref 30.0–36.0)
MCV: 91.8 fL (ref 78.0–100.0)
PLATELETS: 252 10*3/uL (ref 150–400)
RBC: 4.16 MIL/uL — ABNORMAL LOW (ref 4.22–5.81)
RDW: 12.8 % (ref 11.5–15.5)
WBC: 5.8 10*3/uL (ref 4.0–10.5)

## 2016-09-01 LAB — SALICYLATE LEVEL

## 2016-09-01 LAB — ETHANOL: ALCOHOL ETHYL (B): 172 mg/dL — AB (ref <5)

## 2016-09-01 NOTE — ED Notes (Signed)
Pt belongings removed and placed at nursing station. Pt currently in paper scrubs and to be wanded by security.

## 2016-09-01 NOTE — ED Triage Notes (Signed)
Pt reports feeling suicidal and had a plan of cutting wrists with piece of glass or razor. Pt states that symptoms came on suddenly.
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
## 2016-09-01 NOTE — Progress Notes (Signed)
Per Donell SievertSpencer, Simon NP meets inpatient criteria Elsie LincolnShean K. Sherlon HandingHarris, LCAS-A, LPC-A, [facility]  Counselor 09/01/2016 11:30 PM

## 2016-09-01 NOTE — ED Notes (Signed)
Bed: [facility] Expected date:  Expected time:  Means of arrival:  Comments: 
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
## 2016-09-01 NOTE — ED Triage Notes (Signed)
Pt reports to drinking all day today.

## 2016-09-02 DIAGNOSIS — F17210 Nicotine dependence, cigarettes, uncomplicated: Secondary | ICD-10-CM

## 2016-09-02 DIAGNOSIS — Z79899 Other long term (current) drug therapy: Secondary | ICD-10-CM

## 2016-09-02 DIAGNOSIS — Z813 Family history of other psychoactive substance abuse and dependence: Secondary | ICD-10-CM

## 2016-09-02 DIAGNOSIS — Y909 Presence of alcohol in blood, level not specified: Secondary | ICD-10-CM

## 2016-09-02 DIAGNOSIS — F1014 Alcohol abuse with alcohol-induced mood disorder: Secondary | ICD-10-CM

## 2016-09-02 DIAGNOSIS — Z811 Family history of alcohol abuse and dependence: Secondary | ICD-10-CM

## 2016-09-02 DIAGNOSIS — R45851 Suicidal ideations: Secondary | ICD-10-CM

## 2016-09-02 MED ORDER — GABAPENTIN 300 MG PO CAPS: 300.0000 mg | ORAL_CAPSULE | Freq: Three times a day (TID) | ORAL | Status: DC

## 2016-09-02 MED ORDER — LORAZEPAM 2 MG/ML IJ SOLN: 0.0000 mg | Freq: Four times a day (QID) | INTRAMUSCULAR | Status: DC

## 2016-09-02 MED ORDER — THIAMINE HCL 100 MG/ML IJ SOLN: 100.0000 mg | Freq: Every day | INTRAMUSCULAR | Status: DC

## 2016-09-02 MED ORDER — LORAZEPAM 1 MG PO TABS
0.0000 mg | ORAL_TABLET | Freq: Four times a day (QID) | ORAL | Status: DC
  Administered 2016-09-02: 1 mg via ORAL
  Filled 2016-09-02: qty 1

## 2016-09-02 MED ORDER — VITAMIN B-1 100 MG PO TABS: 100.0000 mg | ORAL_TABLET | Freq: Every day | ORAL | Status: DC

## 2016-09-02 MED ORDER — GABAPENTIN 300 MG PO CAPS: 300.0000 mg | ORAL_CAPSULE | Freq: Three times a day (TID) | ORAL | 0 refills | Status: DC

## 2016-09-02 MED ORDER — ALUM & MAG HYDROXIDE-SIMETH 200-200-20 MG/5ML PO SUSP: 30.0000 mL | Freq: Four times a day (QID) | ORAL | Status: DC | PRN

## 2016-09-02 MED ORDER — NICOTINE 21 MG/24HR TD PT24: 21.0000 mg | MEDICATED_PATCH | Freq: Every day | TRANSDERMAL | Status: DC

## 2016-09-02 NOTE — Discharge Instructions (Signed)
To help you maintain a sober lifestyle, a substance abuse treatment program may be beneficial to you.  Contact [facility] at your earliest opportunity to ask about enrolling in their program: ° °     [facility] ([facility] °     [address] °     [place], [place] [address] °     [phone] °     New patients are seen at the walk-in clinic every Tuesday from 9:00 am - 12:00 pm. °

## 2016-09-02 NOTE — BH Assessment (Signed)
[facility] Assessment Progress Note  Per Thedore MinsMojeed Akintayo, MD, this pt does not require psychiatric hospitalization at this time.  [facility]  This has been included in pt's discharge instructions.  [name] nurse, Carlisle BeersLuann, has been notified.  Doylene Canninghomas Evangelyne Loja, MA Triage Specialist [phone]

## 2016-09-02 NOTE — B[facility] Suicide Risk Assessment (Signed)
Suicide Risk Assessment  Discharge Assessment   [facility] Discharge Suicide Risk Assessment   Principal Problem: Alcohol-induced mood disorder [facility]) Discharge Diagnoses:  Patient Active Problem List   Diagnosis Date Noted  . Alcohol-induced mood disorder (HCC) [F10.94] 06/29/2015    Priority: High  . Alcohol use disorder, severe, dependence (HCC) [F10.20] 06/11/2015    Priority: High  . Alcohol abuse [F10.10] 12/05/2013    Priority: High  . Alcohol abuse with alcohol-induced mood disorder (HCC) [F10.14] 01/09/2016  . Homicidal ideation [R45.850]   . Severe alcohol use disorder (HCC) [F10.20] 06/24/2015  . Substance induced mood disorder (HCC) [F19.94] 06/24/2015  . 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 Fracture of bone [T14.8XXA] 06/11/2015  . Polysubstance abuse [F19.10] 09/06/2014  . GIB (gastrointestinal bleeding) [K92.2] 09/06/2014  . Homeless [Z59.0] 09/06/2014  . GERD (gastroesophageal reflux disease) [K21.9]   . PUD (peptic ulcer disease) [K27.9]   . Gastroesophageal reflux disease without esophagitis [K21.9]   . Hypokalemia [E87.6]   . S/P alcohol detoxification [Z09] 12/01/2013  . Seizure (HCC) [R56.9] 08/24/2013  . Tobacco use disorder [F17.200] 08/19/2013    Total Time spent with patient: 45 minutes  Musculoskeletal: Strength & Muscle Tone: within normal limits Gait & Station: normal Patient leans: N/A  Psychiatric Specialty Exam: Physical Exam  Constitutional: He is oriented to person, place, and time. He appears well-developed and well-nourished.  HENT:  Head: Normocephalic.  Neck: Normal range of motion.  Respiratory: Effort normal.  Musculoskeletal: Normal range of motion.  Neurological: He is alert and oriented to person, place, and time.  Psychiatric: He has a normal mood and affect. His speech is normal. Judgment normal. Cognition and memory are normal.    Review of  Systems  Psychiatric/Behavioral: Positive for substance abuse.  All other systems reviewed and are negative.   Blood pressure 113/73, pulse 89, temperature 98.1 F (36.7 C), temperature source Oral, resp. rate 16, height 5\' 7"  (1.702 m), weight 65.8 kg (145 lb), SpO2 99 %.Body mass index is 22.71 kg/m.  General Appearance: Casual  Eye Contact:  Good  Speech:  Normal Rate  Volume:  Normal  Mood:  Euthymic  Affect:  Congruent  Thought Process:  Coherent and Descriptions of Associations: Intact  Orientation:  Full (Time, Place, and Person)  Thought Content:  WDL and Logical  Suicidal Thoughts:  No  Homicidal Thoughts:  No  Memory:  Immediate;   Good Recent;   Good Remote;   Good  Judgement:  Fair  Insight:  Fair  Psychomotor Activity:  Normal  Concentration:  Concentration: Good and Attention Span: Good  Recall:  Good  Fund of Knowledge:  Good  Language:  Good  Akathisia:  No  Handed:  Right  AIMS (if indicated):     Assets:  Leisure Time Physical Health Resilience  ADL's:  Intact  Cognition:  WNL  Sleep:      Mental Status Per Nursing Assessment::   On Admission:   alcohol intoxication with suicidal ideations  Demographic Factors:  Male and Caucasian  Loss Factors: NA  Historical Factors: NA  Risk Reduction Factors:   Sense of responsibility to family  Continued Clinical Symptoms:  None  Cognitive Features That Contribute To Risk:  None    Suicide Risk:  Minimal: No identifiable suicidal ideation.  Patients presenting with no risk factors but with morbid ruminations; may be classified as minimal risk based on the severity of the depressive symptoms  Plan Of Care/Follow-up recommendations:  Activity:  as tolerated Diet:  heart healthy diet  Jimmy Demario, NP 09/02/2016, 11:42 AM
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
## 2016-09-02 NOTE — Progress Notes (Signed)
   09/02/16 1000  Clinical Encounter Type  Visited With Patient  Visit Type Initial;Psychological support;Spiritual support;Behavioral Health;ED  Referral From Patient  Consult/Referral To Chaplain  Spiritual Encounters  Spiritual Needs Other (Comment) (Clothing )  Stress Factors  Patient Stress Factors Other (Comment) (Clothing )   I visited with the patient who stated that he needed clothing. I provided clothing for the patient.   Please, contact Spiritual care for further assistance.   Chaplain Clint BolderBrittany Muneeb Veras M.Div.

## 2016-09-02 NOTE — ED Notes (Signed)
Presents with SI, plan to cut wrists with broken glass.  Denies HI.  Admits to AVH, telling him to kill himself.  Feeling hopeless.    Pt reports he has 3-4 drinks per day.  Denies street drugs.  Pt reports he has been diagnosed with Bipolar, Schizophrenia and Depression in the past.  A&O x 3, no distress noted, calm & cooperative.  Monitoring for safety, Q 15 min checks in effect.  Safety check for contraband completed, no items found.

## 2016-10-04 ENCOUNTER — Encounter (HOSPITAL_COMMUNITY): Payer: Self-pay

## 2016-10-04 DIAGNOSIS — F17210 Nicotine dependence, cigarettes, uncomplicated: Secondary | ICD-10-CM | POA: Insufficient documentation

## 2016-10-04 DIAGNOSIS — F1010 Alcohol abuse, uncomplicated: Secondary | ICD-10-CM | POA: Insufficient documentation

## 2016-10-04 DIAGNOSIS — Z79899 Other long term (current) drug therapy: Secondary | ICD-10-CM | POA: Insufficient documentation

## 2016-10-04 LAB — CBC
HCT: 37.3 % — ABNORMAL LOW (ref 39.0–52.0)
Hemoglobin: 13.1 g/dL (ref 13.0–17.0)
MCH: 32.1 pg (ref 26.0–34.0)
MCHC: 35.1 g/dL (ref 30.0–36.0)
MCV: 91.4 fL (ref 78.0–100.0)
PLATELETS: 220 10*3/uL (ref 150–400)
RBC: 4.08 MIL/uL — ABNORMAL LOW (ref 4.22–5.81)
RDW: 13.3 % (ref 11.5–15.5)
WBC: 5.9 10*3/uL (ref 4.0–10.5)
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
## 2016-10-04 NOTE — ED Triage Notes (Signed)
[facility] from [facility], last drink was a little while ago, pt states he drank "all he could get his hand on". Pt states he drinks as much as he can everyday, and want to stop now cause he cant eat, denies SI/Hi/AVH

## 2016-10-05 ENCOUNTER — Emergency Department (HOSPITAL_COMMUNITY)
Admission: EM | Admit: 2016-10-05 | Discharge: 2016-10-05 | Disposition: A | Discharge status: Home / Self Care | Payer: Self-pay | Attending: Emergency Medicine | Admitting: Emergency Medicine

## 2016-10-05 DIAGNOSIS — F1010 Alcohol abuse, uncomplicated: Secondary | ICD-10-CM

## 2016-10-05 LAB — RAPID URINE DRUG SCREEN, HOSP PERFORMED
Amphetamines: NOT DETECTED
BENZODIAZEPINES: NOT DETECTED
Barbiturates: NOT DETECTED
COCAINE: NOT DETECTED
OPIATES: NOT DETECTED
Tetrahydrocannabinol: NOT DETECTED

## 2016-10-05 LAB — COMPREHENSIVE METABOLIC PANEL
ALBUMIN: 3.9 g/dL (ref 3.5–5.0)
ALK PHOS: 74 U/L (ref 38–126)
ALT: 24 U/L (ref 17–63)
AST: 42 U/L — AB (ref 15–41)
Anion gap: 10 (ref 5–15)
BILIRUBIN TOTAL: 0.5 mg/dL (ref 0.3–1.2)
CALCIUM: 8.7 mg/dL — AB (ref 8.9–10.3)
CO2: 21 mmol/L — AB (ref 22–32)
CREATININE: 0.65 mg/dL (ref 0.61–1.24)
Chloride: 95 mmol/L — ABNORMAL LOW (ref 101–111)
GFR calc Af Amer: >60 mL/min (ref >60)
GFR calc non Af Amer: >60 mL/min (ref >60)
GLUCOSE: 100 mg/dL — AB (ref 65–99)
Potassium: 3.9 mmol/L (ref 3.5–5.1)
Sodium: 126 mmol/L — ABNORMAL LOW (ref 135–145)
TOTAL PROTEIN: 7.5 g/dL (ref 6.5–8.1)

## 2016-10-05 LAB — ETHANOL: ALCOHOL ETHYL (B): 228 mg/dL — AB (ref <5)

## 2016-10-05 NOTE — ED Notes (Signed)
Unable to sign e signature due to no pad in room.

## 2016-10-18 ENCOUNTER — Emergency Department (HOSPITAL_COMMUNITY)
Admission: EM | Admit: 2016-10-18 | Discharge: 2016-10-19 | Disposition: A | Discharge status: Home / Self Care | Payer: Self-pay | Attending: Emergency Medicine | Admitting: Emergency Medicine

## 2016-10-18 ENCOUNTER — Encounter (HOSPITAL_COMMUNITY): Payer: Self-pay

## 2016-10-18 DIAGNOSIS — F1094 Alcohol use, unspecified with alcohol-induced mood disorder: Secondary | ICD-10-CM | POA: Diagnosis present

## 2016-10-18 DIAGNOSIS — R45851 Suicidal ideations: Secondary | ICD-10-CM

## 2016-10-18 DIAGNOSIS — F332 Major depressive disorder, recurrent severe without psychotic features: Secondary | ICD-10-CM | POA: Insufficient documentation

## 2016-10-18 DIAGNOSIS — R45850 Homicidal ideations: Secondary | ICD-10-CM

## 2016-10-18 DIAGNOSIS — E871 Hypo-osmolality and hyponatremia: Secondary | ICD-10-CM

## 2016-10-18 DIAGNOSIS — F17210 Nicotine dependence, cigarettes, uncomplicated: Secondary | ICD-10-CM | POA: Insufficient documentation

## 2016-10-18 DIAGNOSIS — F1020 Alcohol dependence, uncomplicated: Secondary | ICD-10-CM

## 2016-10-18 DIAGNOSIS — F10920 Alcohol use, unspecified with intoxication, uncomplicated: Secondary | ICD-10-CM

## 2016-10-18 DIAGNOSIS — F10229 Alcohol dependence with intoxication, unspecified: Secondary | ICD-10-CM | POA: Insufficient documentation

## 2016-10-18 LAB — COMPREHENSIVE METABOLIC PANEL
ALK PHOS: 81 U/L (ref 38–126)
ALT: 22 U/L (ref 17–63)
AST: 41 U/L (ref 15–41)
Albumin: 4 g/dL (ref 3.5–5.0)
Anion gap: 11 (ref 5–15)
BILIRUBIN TOTAL: 0.4 mg/dL (ref 0.3–1.2)
BUN: <5 mg/dL — ABNORMAL LOW (ref 6–20)
CALCIUM: 8.9 mg/dL (ref 8.9–10.3)
CO2: 20 mmol/L — ABNORMAL LOW (ref 22–32)
CREATININE: 0.75 mg/dL (ref 0.61–1.24)
Chloride: 97 mmol/L — ABNORMAL LOW (ref 101–111)
GFR calc non Af Amer: >60 mL/min (ref >60)
Glucose, Bld: 63 mg/dL — ABNORMAL LOW (ref 65–99)
Potassium: 4.2 mmol/L (ref 3.5–5.1)
SODIUM: 128 mmol/L — AB (ref 135–145)
TOTAL PROTEIN: 7.8 g/dL (ref 6.5–8.1)

## 2016-10-18 LAB — CBC
HCT: 37.5 % — ABNORMAL LOW (ref 39.0–52.0)
Hemoglobin: 13.3 g/dL (ref 13.0–17.0)
MCH: 32.7 pg (ref 26.0–34.0)
MCHC: 35.5 g/dL (ref 30.0–36.0)
MCV: 92.1 fL (ref 78.0–100.0)
PLATELETS: 266 10*3/uL (ref 150–400)
RBC: 4.07 MIL/uL — ABNORMAL LOW (ref 4.22–5.81)
RDW: 13.3 % (ref 11.5–15.5)
WBC: 7.9 10*3/uL (ref 4.0–10.5)

## 2016-10-18 LAB — RAPID URINE DRUG SCREEN, HOSP PERFORMED
Amphetamines: NOT DETECTED
Barbiturates: NOT DETECTED
Benzodiazepines: NOT DETECTED
COCAINE: NOT DETECTED
OPIATES: NOT DETECTED
TETRAHYDROCANNABINOL: NOT DETECTED

## 2016-10-18 LAB — ACETAMINOPHEN LEVEL

## 2016-10-18 LAB — ETHANOL: ALCOHOL ETHYL (B): 160 mg/dL — AB (ref <5)

## 2016-10-18 LAB — SALICYLATE LEVEL

## 2016-10-18 MED ORDER — ONDANSETRON HCL 4 MG PO TABS: 4.0000 mg | ORAL_TABLET | Freq: Three times a day (TID) | ORAL | Status: DC | PRN

## 2016-10-18 MED ORDER — ALUM & MAG HYDROXIDE-SIMETH 200-200-20 MG/5ML PO SUSP: 30.0000 mL | Freq: Four times a day (QID) | ORAL | Status: DC | PRN

## 2016-10-18 MED ORDER — ZOLPIDEM TARTRATE 5 MG PO TABS: 5.0000 mg | ORAL_TABLET | Freq: Every evening | ORAL | Status: DC | PRN

## 2016-10-18 MED ORDER — NICOTINE 14 MG/24HR TD PT24
14.0000 mg | MEDICATED_PATCH | Freq: Every day | TRANSDERMAL | Status: DC
  Administered 2016-10-19: 14 mg via TRANSDERMAL
  Filled 2016-10-18: qty 1

## 2016-10-18 MED ORDER — IBUPROFEN 200 MG PO TABS: 600.0000 mg | ORAL_TABLET | Freq: Three times a day (TID) | ORAL | Status: DC | PRN

## 2016-10-18 NOTE — ED Notes (Signed)
Bed: [facility] Expected date:  Expected time:  Means of arrival:  Comments: 
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
## 2016-10-18 NOTE — ED Triage Notes (Signed)
States wants to hurt self with no plan voiced alert and oriented states he wants help.

## 2016-10-19 MED ORDER — HALOPERIDOL 5 MG PO TABS
5.0000 mg | ORAL_TABLET | Freq: Every day | ORAL | Status: DC
  Administered 2016-10-19: 5 mg via ORAL
  Filled 2016-10-19: qty 1

## 2016-10-19 MED ORDER — HALOPERIDOL 5 MG PO TABS
5.0000 mg | ORAL_TABLET | Freq: Every day | ORAL | 0 refills | Status: DC
Start: 2016-10-19 — End: 2017-04-17

## 2016-10-19 NOTE — ED Notes (Signed)
Hourly rounding reveals patient sleeping in room. No complaints, stable, in no acute distress. Q15 minute rounds and monitoring via Security Cameras to continue. 

## 2016-10-19 NOTE — ED Notes (Signed)
Pt. Transferred to [facility] from ED to room 34 after screening for contraband. Report to include Situation, Background, Assessment and [facility]. Pt. Oriented to unit including Q15 minute rounds as well as the security cameras for their protection. Patient is alert and oriented, warm and dry in no acute distress. Patient denies HI  AVH. Pt. Encouraged to let me know if needs arise.

## 2016-10-19 NOTE — [facility] Suicide Risk Assessment (Signed)
Suicide Risk Assessment  Discharge Assessment   [name]   Principal Problem: Alcohol-induced mood disorder [facility]HCC) Discharge Diagnoses:  Patient Active Problem List   Diagnosis Date Noted  . Alcohol-induced mood disorder (HCC) [F10.94] 06/29/2015    Priority: High  . Alcohol use disorder, severe, dependence (HCC) [F10.20] 06/11/2015    Priority: High  . Alcohol abuse [F10.10] 12/05/2013    Priority: High  . Alcohol abuse with alcohol-induced mood disorder (HCC) [F10.14] 01/09/2016  . Homicidal ideation [R45.850]   . Severe alcohol use disorder (HCC) [F10.20] 06/24/2015  . Substance induced mood disorder (HCC) [F19.94] 06/24/2015  . 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 Fracture of bone [T14.8XXA] 06/11/2015  . Polysubstance abuse [F19.10] 09/06/2014  . GIB (gastrointestinal bleeding) [K92.2] 09/06/2014  . Homeless [Z59.0] 09/06/2014  . GERD (gastroesophageal reflux disease) [K21.9]   . PUD (peptic ulcer disease) [K27.9]   . Gastroesophageal reflux disease without esophagitis [K21.9]   . Hypokalemia [E87.6]   . S/P alcohol detoxification [Z09] 12/01/2013  . Seizure (HCC) [R56.9] 08/24/2013  . Tobacco use disorder [F17.200] 08/19/2013    Total Time spent with patient: 45 minutes  Musculoskeletal: Strength & Muscle Tone: within normal limits Gait & Station: normal Patient leans: N/A  Psychiatric Specialty Exam:   Blood pressure 125/79, pulse 82, temperature 98 F (36.7 C), resp. rate 20, SpO2 99 %.There is no height or weight on file to calculate BMI.  General Appearance: Casual  Eye Contact::  Good  Speech:  Normal Rate409  Volume:  Normal  Mood:  Irritable  Affect:  Congruent  Thought Process:  Coherent and Descriptions of Associations: Intact  Orientation:  Full (Time, Place, and Person)  Thought Content:  WDL and Logical  Suicidal Thoughts:  No   Homicidal Thoughts:  No  Memory:  Immediate;   Good Recent;   Good Remote;   Good  Judgement:  Fair  Insight:  Fair  Psychomotor Activity:  Normal  Concentration:  Good  Recall:  Good  Fund of Knowledge:Fair  Language: Good  Akathisia:  No  Handed:  Right  AIMS (if indicated):     Assets:  Leisure Time Physical Health Resilience  Sleep:     Cognition: WNL  ADL's:  Intact   Mental Status Per Nursing Assessment::   On Admission:   alcohol abuse with auditory hallucinations.  He is well known to this ED and providers with multiple, similar presentations and multiple admissions for detox and rehab.  Today, he reports he could not afford his medicines-[facility]  "I'm not going to stop drinking."  Education regarding voices and alcohol abuse.  No suicidal/homicidal ideations, does not appear to be responding to internal stimuli, no withdrawal symptoms.  Stable for discharge.  Demographic Factors:  Male and Caucasian  Loss Factors: NA  Historical Factors: NA  Risk Reduction Factors:   NA  Continued Clinical Symptoms:  Irritable   Cognitive Features That Contribute To Risk:  None    Suicide Risk:  Minimal: No identifiable suicidal ideation.  Patients presenting with no risk factors but with morbid ruminations; may be classified as minimal risk based on the severity of the depressive symptoms    Plan Of Care/Follow-up recommendations:  Activity:  as tolerated Diet:  heart healthy diet  LORD, JAMISON, NP 10/19/2016, 9:57 AM

## 2016-10-19 NOTE — Discharge Instructions (Signed)
For your ongoing mental health needs, you are advised to follow up with [facility].  New and returning patients are seen at their walk-in clinic.  Walk-in hours are Monday - Friday from 8:00 am - 3:00 pm.  Walk-in patients are seen on a first come, first served basis.  Try to arrive as early as possible for he best chance of being seen the same day: ° °     [facility] °     [address] °     [place], [place] [address] °     [phone] °

## 2016-10-19 NOTE — Progress Notes (Signed)
Pt is recommended for AM psych eval per Nira ConnJason Berry, NP. TTS attempted to contact EDP to advise of recommendation but was unable to reach provider who is unavailable. [name] nurse Jillyn HiddenGary, RN notified of the recommendation.  Princess BruinsAquicha Gunther Zawadzki, MSW, LCSWA TTS Specialist [phone]

## 2016-10-19 NOTE — ED Notes (Signed)
Snack and beverage given. 
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
## 2016-10-19 NOTE — ED Notes (Signed)
Pt A&O x 3, no distress noted, calm & cooperative.  Watching TV at present.  Monitoring for safety, Q 15 min checks in effect. 

## 2016-11-03 ENCOUNTER — Emergency Department (HOSPITAL_COMMUNITY)
Admission: EM | Admit: 2016-11-03 | Discharge: 2016-11-03 | Disposition: A | Discharge status: Home / Self Care | Payer: Self-pay | Attending: Emergency Medicine | Admitting: Emergency Medicine

## 2016-11-03 DIAGNOSIS — F1994 Other psychoactive substance use, unspecified with psychoactive substance-induced mood disorder: Secondary | ICD-10-CM

## 2016-11-03 DIAGNOSIS — R45851 Suicidal ideations: Secondary | ICD-10-CM | POA: Insufficient documentation

## 2016-11-03 DIAGNOSIS — F17210 Nicotine dependence, cigarettes, uncomplicated: Secondary | ICD-10-CM | POA: Insufficient documentation

## 2016-11-03 DIAGNOSIS — F1010 Alcohol abuse, uncomplicated: Secondary | ICD-10-CM

## 2016-11-03 DIAGNOSIS — Z79899 Other long term (current) drug therapy: Secondary | ICD-10-CM | POA: Insufficient documentation

## 2016-11-03 DIAGNOSIS — F329 Major depressive disorder, single episode, unspecified: Secondary | ICD-10-CM | POA: Insufficient documentation

## 2016-11-03 DIAGNOSIS — F1014 Alcohol abuse with alcohol-induced mood disorder: Secondary | ICD-10-CM | POA: Insufficient documentation

## 2016-11-03 DIAGNOSIS — Y907 Blood alcohol level of 200-239 mg/100 ml: Secondary | ICD-10-CM | POA: Insufficient documentation

## 2016-11-03 DIAGNOSIS — R45850 Homicidal ideations: Secondary | ICD-10-CM | POA: Insufficient documentation

## 2016-11-03 LAB — COMPREHENSIVE METABOLIC PANEL
ALT: 22 U/L (ref 17–63)
AST: 50 U/L — ABNORMAL HIGH (ref 15–41)
Albumin: 3.9 g/dL (ref 3.5–5.0)
Alkaline Phosphatase: 79 U/L (ref 38–126)
Anion gap: 12 (ref 5–15)
BILIRUBIN TOTAL: 0.4 mg/dL (ref 0.3–1.2)
BUN: 7 mg/dL (ref 6–20)
CHLORIDE: 103 mmol/L (ref 101–111)
CO2: 20 mmol/L — ABNORMAL LOW (ref 22–32)
CREATININE: 0.67 mg/dL (ref 0.61–1.24)
Calcium: 9.1 mg/dL (ref 8.9–10.3)
Glucose, Bld: 89 mg/dL (ref 65–99)
POTASSIUM: 4.1 mmol/L (ref 3.5–5.1)
Sodium: 135 mmol/L (ref 135–145)
TOTAL PROTEIN: 7.8 g/dL (ref 6.5–8.1)

## 2016-11-03 LAB — CBC
HCT: 37.8 % — ABNORMAL LOW (ref 39.0–52.0)
HEMOGLOBIN: 13.7 g/dL (ref 13.0–17.0)
MCH: 33.4 pg (ref 26.0–34.0)
MCHC: 36.2 g/dL — AB (ref 30.0–36.0)
MCV: 92.2 fL (ref 78.0–100.0)
PLATELETS: 277 10*3/uL (ref 150–400)
RBC: 4.1 MIL/uL — ABNORMAL LOW (ref 4.22–5.81)
RDW: 13.5 % (ref 11.5–15.5)
WBC: 8.2 10*3/uL (ref 4.0–10.5)

## 2016-11-03 LAB — RAPID URINE DRUG SCREEN, HOSP PERFORMED
Amphetamines: NOT DETECTED
Barbiturates: NOT DETECTED
Benzodiazepines: NOT DETECTED
COCAINE: NOT DETECTED
OPIATES: NOT DETECTED
Tetrahydrocannabinol: NOT DETECTED

## 2016-11-03 LAB — ETHANOL: ALCOHOL ETHYL (B): 201 mg/dL — AB (ref <5)

## 2016-11-03 LAB — SALICYLATE LEVEL: Salicylate Lvl: <7 mg/dL (ref 2.8–30.0)

## 2016-11-03 LAB — ACETAMINOPHEN LEVEL

## 2016-11-03 NOTE — ED Notes (Signed)
Patient informed of discharge, he refused vital signs as well as signing for discharge. Pt alert and oriented, ambulating well with out assistance, escorted out by security.

## 2016-11-03 NOTE — ED Notes (Signed)
Pt has eaten a sandwich and cheese stick and had PO fluids

## 2016-11-03 NOTE — ED Notes (Addendum)
Pt refuse his  Discharge vitals

## 2016-11-03 NOTE — ED Notes (Addendum)
Pt had a shower. 

## 2016-11-03 NOTE — ED Triage Notes (Signed)
Per [employer]  Pt is brought in by [employer]. Pt states he is SI and HI. [employer] brought him to the ED for evaluation. Pt is calm and cooperative at this time.

## 2016-11-03 NOTE — ED Notes (Signed)
Pt requesting food, drink, and a shower

## 2016-11-03 NOTE — Discharge Instructions (Signed)
It was our pleasure to provide your ER care today - we hope that you feel better.  Do not drink alcohol.  No driving for the next 8 hours or anytime when drinking alcohol.   Follow up with AA, and use additional resources for help provided.  For mental health issues and/or crisis, [facility]  Also follow up with primary care doctor in the coming week.

## 2016-11-03 NOTE — ED Notes (Signed)
ED Provider at bedside. 
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
## 2016-11-11 ENCOUNTER — Emergency Department (HOSPITAL_COMMUNITY)
Admission: EM | Admit: 2016-11-11 | Discharge: 2016-11-11 | Disposition: A | Discharge status: Home / Self Care | Payer: Self-pay | Attending: Emergency Medicine | Admitting: Emergency Medicine

## 2016-11-11 ENCOUNTER — Encounter (HOSPITAL_COMMUNITY): Payer: Self-pay | Admitting: Emergency Medicine

## 2016-11-11 DIAGNOSIS — F17210 Nicotine dependence, cigarettes, uncomplicated: Secondary | ICD-10-CM | POA: Insufficient documentation

## 2016-11-11 DIAGNOSIS — Z79899 Other long term (current) drug therapy: Secondary | ICD-10-CM | POA: Insufficient documentation

## 2016-11-11 DIAGNOSIS — R21 Rash and other nonspecific skin eruption: Secondary | ICD-10-CM | POA: Insufficient documentation

## 2016-11-11 MED ORDER — KETOCONAZOLE 2 % EX CREA
TOPICAL_CREAM | Freq: Two times a day (BID) | CUTANEOUS | Status: DC
  Administered 2016-11-11: 1 via TOPICAL
  Filled 2016-11-11: qty 15

## 2016-11-11 NOTE — ED Triage Notes (Signed)
Pt here with itchy rash to right hand finger x 1 year

## 2016-11-11 NOTE — ED Notes (Signed)
See provider assessment 

## 2016-11-11 NOTE — ED Notes (Signed)
Pt given graham crackers per Nicole(RN)

## 2016-11-11 NOTE — Discharge Instructions (Signed)
Use neosporin and Hydrocortisone 1% on your finger. Use this twice a day Use ketoconazole cream twice a day on your hip for three weeks. The discoloration may last several months after treatment
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
## 2016-11-11 NOTE — ED Notes (Signed)
Called pharmacy for medication

## 2016-11-19 ENCOUNTER — Emergency Department (HOSPITAL_COMMUNITY)
Admission: EM | Admit: 2016-11-19 | Discharge: 2016-11-20 | Disposition: A | Discharge status: Home / Self Care | Payer: Self-pay | Attending: Emergency Medicine | Admitting: Emergency Medicine

## 2016-11-19 DIAGNOSIS — F329 Major depressive disorder, single episode, unspecified: Secondary | ICD-10-CM | POA: Insufficient documentation

## 2016-11-19 DIAGNOSIS — Z79899 Other long term (current) drug therapy: Secondary | ICD-10-CM | POA: Insufficient documentation

## 2016-11-19 DIAGNOSIS — R45851 Suicidal ideations: Secondary | ICD-10-CM

## 2016-11-19 DIAGNOSIS — F17210 Nicotine dependence, cigarettes, uncomplicated: Secondary | ICD-10-CM | POA: Insufficient documentation

## 2016-11-19 DIAGNOSIS — R440 Auditory hallucinations: Secondary | ICD-10-CM

## 2016-11-19 LAB — CBC
HCT: 36.2 % — ABNORMAL LOW (ref 39.0–52.0)
Hemoglobin: 12.5 g/dL — ABNORMAL LOW (ref 13.0–17.0)
MCH: 32.6 pg (ref 26.0–34.0)
MCHC: 34.5 g/dL (ref 30.0–36.0)
MCV: 94.5 fL (ref 78.0–100.0)
PLATELETS: 329 10*3/uL (ref 150–400)
RBC: 3.83 MIL/uL — ABNORMAL LOW (ref 4.22–5.81)
RDW: 13.3 % (ref 11.5–15.5)
WBC: 8.3 10*3/uL (ref 4.0–10.5)

## 2016-11-19 LAB — COMPREHENSIVE METABOLIC PANEL
ALBUMIN: 3.8 g/dL (ref 3.5–5.0)
ALT: 16 U/L — ABNORMAL LOW (ref 17–63)
ANION GAP: 9 (ref 5–15)
AST: 27 U/L (ref 15–41)
Alkaline Phosphatase: 61 U/L (ref 38–126)
BUN: 9 mg/dL (ref 6–20)
CHLORIDE: 103 mmol/L (ref 101–111)
CO2: 21 mmol/L — AB (ref 22–32)
Calcium: 8.8 mg/dL — ABNORMAL LOW (ref 8.9–10.3)
Creatinine, Ser: 0.73 mg/dL (ref 0.61–1.24)
GFR calc Af Amer: >60 mL/min (ref >60)
GFR calc non Af Amer: >60 mL/min (ref >60)
GLUCOSE: 90 mg/dL (ref 65–99)
POTASSIUM: 3.7 mmol/L (ref 3.5–5.1)
SODIUM: 133 mmol/L — AB (ref 135–145)
Total Bilirubin: 0.4 mg/dL (ref 0.3–1.2)
Total Protein: 7.4 g/dL (ref 6.5–8.1)

## 2016-11-19 LAB — RAPID URINE DRUG SCREEN, HOSP PERFORMED
AMPHETAMINES: NOT DETECTED
BARBITURATES: NOT DETECTED
BENZODIAZEPINES: NOT DETECTED
COCAINE: NOT DETECTED
Opiates: NOT DETECTED
TETRAHYDROCANNABINOL: NOT DETECTED

## 2016-11-19 LAB — ETHANOL: Alcohol, Ethyl (B): 168 mg/dL — ABNORMAL HIGH (ref <5)

## 2016-11-19 MED ORDER — NICOTINE 21 MG/24HR TD PT24: 21.0000 mg | MEDICATED_PATCH | Freq: Every day | TRANSDERMAL | Status: DC

## 2016-11-19 MED ORDER — LORAZEPAM 2 MG/ML IJ SOLN: 0.0000 mg | Freq: Four times a day (QID) | INTRAMUSCULAR | Status: DC

## 2016-11-19 MED ORDER — LORAZEPAM 2 MG/ML IJ SOLN: 0.0000 mg | Freq: Two times a day (BID) | INTRAMUSCULAR | Status: DC

## 2016-11-19 MED ORDER — LORAZEPAM 1 MG PO TABS
0.0000 mg | ORAL_TABLET | Freq: Four times a day (QID) | ORAL | Status: DC
Start: 2016-11-19 — End: 2016-11-20
  Administered 2016-11-19: 1 mg via ORAL
  Filled 2016-11-19: qty 1

## 2016-11-19 MED ORDER — THIAMINE HCL 100 MG/ML IJ SOLN: 100.0000 mg | Freq: Every day | INTRAMUSCULAR | Status: DC

## 2016-11-19 MED ORDER — LORAZEPAM 1 MG PO TABS
0.0000 mg | ORAL_TABLET | Freq: Two times a day (BID) | ORAL | Status: DC
Start: 2016-11-22 — End: 2016-11-20

## 2016-11-19 MED ORDER — ONDANSETRON HCL 4 MG PO TABS: 4.0000 mg | ORAL_TABLET | Freq: Three times a day (TID) | ORAL | Status: DC | PRN

## 2016-11-19 MED ORDER — VITAMIN B-1 100 MG PO TABS
100.0000 mg | ORAL_TABLET | Freq: Every day | ORAL | Status: DC
  Administered 2016-11-19: 100 mg via ORAL
  Filled 2016-11-19: qty 1

## 2016-11-19 NOTE — Clinical Social Work Note (Signed)
Clinical Social Work Assessment  Patient Details  Name: Jimmy [name] MRN: [id number] Date of Birth: [birth year]  Date of referral:  11/19/16               Reason for consult:  Substance Use/ETOH Abuse                Permission sought to share information with:    Permission granted to share information::  Yes, Verbal Permission Granted  Name::        Agency::     Relationship::     Contact Information:     Housing/Transportation Living arrangements for the past 2 months:  Homeless Source of Information:  Patient Patient Interpreter Needed:  None Criminal Activity/Legal Involvement Pertinent to Current Situation/Hospitalization:    Significant Relationships:  None Lives with:  Self Do you feel safe going back to the place where you live?  No Need for family participation in patient care:  No (Coment)  Care giving concerns:  Pt asked for information on substance abuse treatment facilities and then refused info due to these facilities not allowing smoking.    Social Worker assessment / plan:  CSW met with pt and confirmed pt's plan to be discharged to a treatment facility to treat his substance use, to live at discharge. Pt then stated he was uninterested because he could not smoke. CSW provided active listening and validated pt's concerns.  Pt has been homeless prior to being admitted to [facility].   Employment status:  Unemployed Forensic scientist:  Self Pay (Medicaid Pending) PT Recommendations:  Not assessed at this time Information / Referral to community resources:     Patient/Family's Response to care:  Patient alert and oriented.  Patient not agreeable to plan.  Pt.'s pleasant and appreciated CSW intervention.    Patient/Family's Understanding of and Emotional Response to Diagnosis, Current Treatment, and Prognosis:  Still assessing  Emotional Assessment Appearance:  Appears stated age Attitude/Demeanor/Rapport:    Affect (typically observed):  Irritable, Flat, In  denial, Depressed Orientation:  Oriented to Self, Oriented to Place, Oriented to  Time, Oriented to Situation Alcohol / Substance use:  Alcohol Use Psych involvement (Current and /or in the community):     Discharge Needs  Concerns to be addressed:  Substance Abuse Concerns Readmission within the last 30 days:    Current discharge risk:  Homeless Barriers to Discharge:  Active Substance Use   Claudine Mouton, LCSWA 11/19/2016, 8:46 PM
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
## 2016-11-19 NOTE — ED Triage Notes (Signed)
Patient arrived with [employer] officer after telling officer that he wanted detox and that he was hearing voices.  Patient arrived irritable and argumentative.  Denies SI or HI.
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
## 2016-11-20 NOTE — ED Notes (Signed)
Patient out at nurses station threatening to leave. States that he no longer needs our services.

## 2016-11-20 NOTE — ED Provider Notes (Signed)
Dispo per psych.  I did no evaluate this patient.   Roxy HorsemanBrowning, [place] Rico, PA-C 11/20/16 0458    Gilda CreasePollina, Christopher J, MD 11/20/16 [phone]

## 2016-11-21 ENCOUNTER — Encounter (HOSPITAL_COMMUNITY): Payer: Self-pay | Admitting: Oncology

## 2016-11-21 ENCOUNTER — Emergency Department (HOSPITAL_COMMUNITY)
Admission: EM | Admit: 2016-11-21 | Discharge: 2016-11-21 | Disposition: A | Discharge status: Home / Self Care | Payer: Self-pay

## 2016-11-21 ENCOUNTER — Emergency Department (HOSPITAL_COMMUNITY)
Admission: EM | Admit: 2016-11-21 | Discharge: 2016-11-22 | Disposition: A | Discharge status: Home / Self Care | Payer: Self-pay | Attending: Emergency Medicine | Admitting: Emergency Medicine

## 2016-11-21 DIAGNOSIS — F17210 Nicotine dependence, cigarettes, uncomplicated: Secondary | ICD-10-CM | POA: Insufficient documentation

## 2016-11-21 DIAGNOSIS — F319 Bipolar disorder, unspecified: Secondary | ICD-10-CM | POA: Insufficient documentation

## 2016-11-21 DIAGNOSIS — F1014 Alcohol abuse with alcohol-induced mood disorder: Secondary | ICD-10-CM | POA: Insufficient documentation

## 2016-11-21 DIAGNOSIS — R45851 Suicidal ideations: Secondary | ICD-10-CM | POA: Insufficient documentation

## 2016-11-21 DIAGNOSIS — Z5900 Homelessness unspecified: Secondary | ICD-10-CM

## 2016-11-21 DIAGNOSIS — Z79899 Other long term (current) drug therapy: Secondary | ICD-10-CM | POA: Insufficient documentation

## 2016-11-21 DIAGNOSIS — Z23 Encounter for immunization: Secondary | ICD-10-CM | POA: Insufficient documentation

## 2016-11-21 DIAGNOSIS — F209 Schizophrenia, unspecified: Secondary | ICD-10-CM | POA: Insufficient documentation

## 2016-11-21 DIAGNOSIS — F1910 Other psychoactive substance abuse, uncomplicated: Secondary | ICD-10-CM | POA: Insufficient documentation

## 2016-11-21 LAB — COMPREHENSIVE METABOLIC PANEL
ALT: 19 U/L (ref 17–63)
AST: 30 U/L (ref 15–41)
Albumin: 4 g/dL (ref 3.5–5.0)
Alkaline Phosphatase: 67 U/L (ref 38–126)
Anion gap: 13 (ref 5–15)
BUN: 10 mg/dL (ref 6–20)
CALCIUM: 8.8 mg/dL — AB (ref 8.9–10.3)
CO2: 20 mmol/L — ABNORMAL LOW (ref 22–32)
Chloride: 99 mmol/L — ABNORMAL LOW (ref 101–111)
Creatinine, Ser: 0.63 mg/dL (ref 0.61–1.24)
GLUCOSE: 112 mg/dL — AB (ref 65–99)
Potassium: 3.6 mmol/L (ref 3.5–5.1)
SODIUM: 132 mmol/L — AB (ref 135–145)
TOTAL PROTEIN: 7.8 g/dL (ref 6.5–8.1)
Total Bilirubin: 0.3 mg/dL (ref 0.3–1.2)

## 2016-11-21 LAB — CBC
HEMATOCRIT: 38 % — AB (ref 39.0–52.0)
HEMOGLOBIN: 13.4 g/dL (ref 13.0–17.0)
MCH: 33.3 pg (ref 26.0–34.0)
MCHC: 35.3 g/dL (ref 30.0–36.0)
MCV: 94.5 fL (ref 78.0–100.0)
Platelets: 356 10*3/uL (ref 150–400)
RBC: 4.02 MIL/uL — ABNORMAL LOW (ref 4.22–5.81)
RDW: 13.1 % (ref 11.5–15.5)
WBC: 9 10*3/uL (ref 4.0–10.5)

## 2016-11-21 LAB — RAPID URINE DRUG SCREEN, HOSP PERFORMED
Amphetamines: NOT DETECTED
BARBITURATES: NOT DETECTED
Benzodiazepines: NOT DETECTED
Cocaine: NOT DETECTED
Opiates: NOT DETECTED
Tetrahydrocannabinol: NOT DETECTED

## 2016-11-21 LAB — SALICYLATE LEVEL: Salicylate Lvl: <7 mg/dL (ref 2.8–30.0)

## 2016-11-21 LAB — ACETAMINOPHEN LEVEL: Acetaminophen (Tylenol), Serum: <10 ug/mL — ABNORMAL LOW (ref 10–30)

## 2016-11-21 LAB — ETHANOL: Alcohol, Ethyl (B): 183 mg/dL — ABNORMAL HIGH (ref <5)

## 2016-11-21 MED ORDER — HALOPERIDOL 5 MG PO TABS
5.0000 mg | ORAL_TABLET | Freq: Every day | ORAL | Status: DC
  Administered 2016-11-22: 5 mg via ORAL
  Filled 2016-11-21: qty 1

## 2016-11-21 MED ORDER — TETANUS-DIPHTH-ACELL PERTUSSIS 5-2.5-18.5 LF-MCG/0.5 IM SUSP
0.5000 mL | Freq: Once | INTRAMUSCULAR | Status: AC
  Administered 2016-11-22: 0.5 mL via INTRAMUSCULAR
  Filled 2016-11-21: qty 0.5

## 2016-11-21 NOTE — ED Notes (Signed)
Bed: [facility] Expected date:  Expected time:  Means of arrival:  Comments: 
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
## 2016-11-21 NOTE — ED Notes (Signed)
Bed: [facility] Expected date:  Expected time:  Means of arrival:  Comments: 

## 2016-11-21 NOTE — Progress Notes (Signed)
Nira ConnJason Berry, NP recommends inpt treatment. TTS attempted to notify triage nurse and EDP, routed to nurse secretary instead. Providers unavailable at this time.   Princess BruinsAquicha Derhonda Eastlick, MSW, LCSWA TTS Specialist [phone]
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
## 2016-11-21 NOTE — ED Notes (Signed)
Bed: [facility] Expected date:  Expected time:  Means of arrival:  Comments: 
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
## 2016-11-22 DIAGNOSIS — R4587 Impulsiveness: Secondary | ICD-10-CM

## 2016-11-22 DIAGNOSIS — F1910 Other psychoactive substance abuse, uncomplicated: Secondary | ICD-10-CM

## 2016-11-22 DIAGNOSIS — F419 Anxiety disorder, unspecified: Secondary | ICD-10-CM

## 2016-11-22 DIAGNOSIS — R450 Nervousness: Secondary | ICD-10-CM

## 2016-11-22 DIAGNOSIS — F1014 Alcohol abuse with alcohol-induced mood disorder: Secondary | ICD-10-CM

## 2016-11-22 DIAGNOSIS — F17210 Nicotine dependence, cigarettes, uncomplicated: Secondary | ICD-10-CM

## 2016-11-22 DIAGNOSIS — Z811 Family history of alcohol abuse and dependence: Secondary | ICD-10-CM

## 2016-11-22 NOTE — ED Notes (Signed)
Hourly rounding reveals patient in room. Stable, in no acute distress. Q15 minute rounds and monitoring via [employer] to continue. Patient asked about "checking out". Informed pt. Doctor would be in later if he wanted to discuss discharge with them.

## 2016-11-22 NOTE — ED Notes (Signed)
Hourly rounding reveals patient sleeping in room. No complaints, stable, in no acute distress. Q15 minute rounds and monitoring via Security Cameras to continue. 
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
## 2016-11-22 NOTE — ED Notes (Signed)
[facility] Expected date:  Expected time:  Means of arrival:  Comments: Jimmy [name]

## 2016-11-22 NOTE — ED Notes (Signed)
Pt. Transferred to [facility] from ED to room after screening for contraband. Report to include Situation, Background, Assessment and Recommendations from [facility] RN. Pt. Oriented to unit including Q15 minute rounds as well as the security cameras for their protection. Patient is alert and oriented, warm and dry in no acute distress. Patient denies SI, HI, and AVH. Pt. Encouraged to let me know if needs arise.

## 2016-11-22 NOTE — [facility] Suicide Risk Assessment (Signed)
Suicide Risk Assessment  Discharge Assessment   [facility]   Principal Problem: Alcohol abuse with alcohol-induced mood disorder [facility](HCC) Discharge Diagnoses:  Patient Active Problem List   Diagnosis Date Noted  . Alcohol abuse with alcohol-induced mood disorder (HCC) [F10.14] 01/09/2016    Priority: High  . Polysubstance abuse [F19.10] 09/06/2014    Priority: High  . Homicidal ideation [R45.850]   . Alcohol-induced mood disorder (HCC) [F10.94] 06/29/2015  . Severe alcohol use disorder (HCC) [F10.20] 06/24/2015  . Substance induced mood disorder (HCC) [F19.94] 06/24/2015  . Alcohol use disorder, severe, dependence (HCC) [F10.20] 06/11/2015  . 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 Fracture of bone [T14.8XXA] 06/11/2015  . GIB (gastrointestinal bleeding) [K92.2] 09/06/2014  . Homeless [Z59.0] 09/06/2014  . GERD (gastroesophageal reflux disease) [K21.9]   . PUD (peptic ulcer disease) [K27.9]   . Gastroesophageal reflux disease without esophagitis [K21.9]   . Hypokalemia [E87.6]   . Alcohol abuse [F10.10] 12/05/2013  . S/P alcohol detoxification [Z09] 12/01/2013  . Seizure (HCC) [R56.9] 08/24/2013  . Tobacco use disorder [F17.200] 08/19/2013    Total Time spent with patient: 45 minutes  Musculoskeletal: Strength & Muscle Tone: within normal limits Gait & Station: normal Patient leans: N/A  Psychiatric Specialty Exam: Physical Exam  Constitutional: He is oriented to person, place, and time. He appears well-developed and well-nourished.  Respiratory: Effort normal.  Musculoskeletal: Normal range of motion.  Neurological: He is alert and oriented to person, place, and time.  Psychiatric: His speech is normal and behavior is normal. Thought content normal. His mood appears anxious. Cognition and memory are normal. He expresses impulsivity.   Review of Systems   Psychiatric/Behavioral: Positive for depression and substance abuse. Negative for hallucinations, memory loss and suicidal ideas. The patient is nervous/anxious. The patient does not have insomnia.    Blood pressure 118/69, pulse 78, temperature 98.1 F (36.7 C), temperature source Oral, resp. rate 18, SpO2 98 %.There is no height or weight on file to calculate BMI. General Appearance: Disheveled Eye Contact:  Fair Speech:  Clear and Coherent Volume:  Normal Mood:  Anxious and Depressed Affect:  Congruent Thought Process:  Coherent Orientation:  Full (Time, Place, and Person) Thought Content:  Logical Suicidal Thoughts:  No Homicidal Thoughts:  No Memory:  Immediate;   Fair Recent;   Fair Remote;   Fair Judgement:  Fair Insight:  Fair Psychomotor Activity:  Normal Concentration:  Concentration: Fair and Attention Span: Fair Recall:  [employer] of Knowledge:  Good Language:  Good Akathisia:  No Handed:  Right AIMS (if indicated):    Assets:  Communication Skills Resilience Social Support ADL's:  Intact Cognition:  WNL  Mental Status Per Nursing Assessment::   On Admission:     Demographic Factors:  Male, Caucasian, Low socioeconomic status and Unemployed  Loss Factors: Financial problems/change in socioeconomic status  Historical Factors: Impulsivity  Risk Reduction Factors:   Sense of responsibility to family  Continued Clinical Symptoms:  Depression:   Impulsivity Alcohol/Substance Abuse/Dependencies  Cognitive Features That Contribute To Risk:  Closed-mindedness    Suicide Risk:  Minimal: No identifiable suicidal ideation.  Patients presenting with no risk factors but with morbid ruminations; may be classified as minimal risk based on the severity of the depressive symptoms    Plan Of Care/Follow-up recommendations:  Activity:  as tolerated Diet:  Heart healthy  Laveda AbbeLaurie Britton Parks, NP 11/22/2016, 1:00 PM

## 2016-11-22 NOTE — ED Notes (Signed)
Pt discharged ambulatory with resources and a bus pass.  All belongings were returned to patient.  Pt was in no distress. Pt denies S/I and H/I.

## 2016-11-22 NOTE — ED Notes (Signed)
Hourly rounding reveals sleeping patient in room. No complaints, stable, in no acute distress. Q15 minute rounds and monitoring via [employer] Cam[employer] to continue.

## 2016-11-26 ENCOUNTER — Emergency Department (HOSPITAL_COMMUNITY)
Admission: EM | Admit: 2016-11-26 | Discharge: 2016-11-26 | Disposition: A | Discharge status: Home / Self Care | Payer: Self-pay | Attending: Emergency Medicine | Admitting: Emergency Medicine

## 2016-11-26 ENCOUNTER — Encounter (HOSPITAL_COMMUNITY): Payer: Self-pay

## 2016-11-26 DIAGNOSIS — Z79899 Other long term (current) drug therapy: Secondary | ICD-10-CM | POA: Insufficient documentation

## 2016-11-26 DIAGNOSIS — F17210 Nicotine dependence, cigarettes, uncomplicated: Secondary | ICD-10-CM | POA: Insufficient documentation

## 2016-11-26 DIAGNOSIS — L309 Dermatitis, unspecified: Secondary | ICD-10-CM | POA: Insufficient documentation

## 2016-11-26 NOTE — ED Notes (Addendum)
Melvenia BeamShari, PA went to bedside to assess patient and provided him food. While giving report to DrewLilbeth, RN, patient ambulated out of facility. When inquiring why he was leaving out the front door, he states I will just put cream on it. Notified Shari, RN of patients departure. As patient was leaving, he appeared in no respiratory distress.
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
## 2016-11-26 NOTE — ED Triage Notes (Signed)
Per EMS, Pt c/o bilateral hand itching x atleast 2 months.  Pt reported to EMS that he has tried several OTC medications w/o relief.  No rash noted.  Poor hygiene noted.

## 2016-11-30 ENCOUNTER — Encounter (HOSPITAL_COMMUNITY): Payer: Self-pay

## 2016-11-30 ENCOUNTER — Emergency Department (HOSPITAL_COMMUNITY): Payer: Self-pay

## 2016-11-30 ENCOUNTER — Emergency Department (HOSPITAL_COMMUNITY)
Admission: EM | Admit: 2016-11-30 | Discharge: 2016-11-30 | Disposition: A | Discharge status: Home / Self Care | Payer: Self-pay | Attending: Emergency Medicine | Admitting: Emergency Medicine

## 2016-11-30 DIAGNOSIS — Y929 Unspecified place or not applicable: Secondary | ICD-10-CM | POA: Insufficient documentation

## 2016-11-30 DIAGNOSIS — Z5900 Homelessness unspecified: Secondary | ICD-10-CM

## 2016-11-30 DIAGNOSIS — Z720 Tobacco use: Secondary | ICD-10-CM

## 2016-11-30 DIAGNOSIS — X088XXA Exposure to other specified smoke, fire and flames, initial encounter: Secondary | ICD-10-CM | POA: Insufficient documentation

## 2016-11-30 DIAGNOSIS — Y939 Activity, unspecified: Secondary | ICD-10-CM | POA: Insufficient documentation

## 2016-11-30 DIAGNOSIS — Y998 Other external cause status: Secondary | ICD-10-CM | POA: Insufficient documentation

## 2016-11-30 DIAGNOSIS — F1010 Alcohol abuse, uncomplicated: Secondary | ICD-10-CM

## 2016-11-30 DIAGNOSIS — K2920 Alcoholic gastritis without bleeding: Secondary | ICD-10-CM

## 2016-11-30 DIAGNOSIS — F17210 Nicotine dependence, cigarettes, uncomplicated: Secondary | ICD-10-CM | POA: Insufficient documentation

## 2016-11-30 DIAGNOSIS — Z79899 Other long term (current) drug therapy: Secondary | ICD-10-CM | POA: Insufficient documentation

## 2016-11-30 DIAGNOSIS — T23222A Burn of second degree of single left finger (nail) except thumb, initial encounter: Secondary | ICD-10-CM

## 2016-11-30 LAB — BASIC METABOLIC PANEL
ANION GAP: 8 (ref 5–15)
BUN: 5 mg/dL — ABNORMAL LOW (ref 6–20)
CALCIUM: 8.6 mg/dL — AB (ref 8.9–10.3)
CO2: 21 mmol/L — AB (ref 22–32)
Chloride: 104 mmol/L (ref 101–111)
Creatinine, Ser: 0.68 mg/dL (ref 0.61–1.24)
GFR calc non Af Amer: >60 mL/min (ref >60)
Glucose, Bld: 87 mg/dL (ref 65–99)
Potassium: 3.7 mmol/L (ref 3.5–5.1)
Sodium: 133 mmol/L — ABNORMAL LOW (ref 135–145)

## 2016-11-30 LAB — I-STAT TROPONIN, ED
TROPONIN I, POC: 0 ng/mL (ref 0.00–0.08)
Troponin i, poc: 0 ng/mL (ref 0.00–0.08)

## 2016-11-30 LAB — CBC
HCT: 38.8 % — ABNORMAL LOW (ref 39.0–52.0)
HEMOGLOBIN: 13.5 g/dL (ref 13.0–17.0)
MCH: 33.1 pg (ref 26.0–34.0)
MCHC: 34.8 g/dL (ref 30.0–36.0)
MCV: 95.1 fL (ref 78.0–100.0)
Platelets: 339 10*3/uL (ref 150–400)
RBC: 4.08 MIL/uL — AB (ref 4.22–5.81)
RDW: 13.1 % (ref 11.5–15.5)
WBC: 9.5 10*3/uL (ref 4.0–10.5)

## 2016-11-30 LAB — ETHANOL: Alcohol, Ethyl (B): 138 mg/dL — ABNORMAL HIGH (ref <5)

## 2016-11-30 LAB — LIPASE, BLOOD: LIPASE: 45 U/L (ref 11–51)

## 2016-11-30 MED ORDER — GI COCKTAIL ~~LOC~~
30.0000 mL | Freq: Once | ORAL | Status: AC
  Administered 2016-11-30: 30 mL via ORAL
  Filled 2016-11-30: qty 30

## 2016-11-30 MED ORDER — SODIUM CHLORIDE 0.9 % IV BOLUS (SEPSIS)
1000.0000 mL | Freq: Once | INTRAVENOUS | Status: AC
  Administered 2016-11-30: 1000 mL via INTRAVENOUS

## 2016-11-30 MED ORDER — SILVER SULFADIAZINE 1 % EX CREA
TOPICAL_CREAM | Freq: Once | CUTANEOUS | Status: AC
  Administered 2016-11-30: 21:00:00 via TOPICAL
  Filled 2016-11-30: qty 85

## 2016-11-30 MED ORDER — SILVER SULFADIAZINE 1 % EX CREA
1.0000 | TOPICAL_CREAM | Freq: Every day | CUTANEOUS | 0 refills | Status: DC
Start: 2016-11-30 — End: 2017-04-17

## 2016-11-30 MED ORDER — FAMOTIDINE 20 MG PO TABS: 20.0000 mg | ORAL_TABLET | Freq: Two times a day (BID) | ORAL | 0 refills | Status: DC

## 2016-11-30 MED ORDER — FAMOTIDINE IN NACL 20-0.9 MG/50ML-% IV SOLN
20.0000 mg | Freq: Once | INTRAVENOUS | Status: AC
  Administered 2016-11-30: 20 mg via INTRAVENOUS
  Filled 2016-11-30: qty 50
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
## 2016-11-30 NOTE — ED Triage Notes (Addendum)
Pt from home via EMS with recurrent 10/10 central chest pressure radiating to L arm x 10 hours. Pt with alochol abuse and reports he was sitting and drinking beer when his CP began. Pt with hx of alcohol abuse, last drink approx 1 hour ago, pt drinks approx 6-12 beers daily. Pt given 324 ASA, 1 NTG, and 500cc NS en route. EMS VS: 110 NSR, 132/79, 97% on RA. 18 G R forearm.

## 2016-11-30 NOTE — ED Notes (Signed)
Pt ambulatory to restroom with steady gait.

## 2016-11-30 NOTE — Discharge Instructions (Signed)
Try to stop drinking alcohol.  Stop smoking.

## 2016-11-30 NOTE — ED Notes (Signed)
Pt in XR at this time

## 2016-11-30 NOTE — ED Notes (Signed)
Pt asleep at this time. IV Pepcid still infusing.

## 2016-11-30 NOTE — ED Notes (Signed)
Pt given coca cola.

## 2016-11-30 NOTE — ED Notes (Signed)
EDP at the bedside.  ?

## 2016-11-30 NOTE — ED Notes (Signed)
ED Provider at bedside. 

## 2016-12-01 ENCOUNTER — Encounter (HOSPITAL_COMMUNITY): Payer: Self-pay | Admitting: *Deleted

## 2016-12-01 ENCOUNTER — Emergency Department (HOSPITAL_COMMUNITY)
Admission: EM | Admit: 2016-12-01 | Discharge: 2016-12-01 | Disposition: A | Discharge status: Home / Self Care | Payer: Self-pay | Attending: Emergency Medicine | Admitting: Emergency Medicine

## 2016-12-01 DIAGNOSIS — F329 Major depressive disorder, single episode, unspecified: Secondary | ICD-10-CM | POA: Insufficient documentation

## 2016-12-01 DIAGNOSIS — Z79899 Other long term (current) drug therapy: Secondary | ICD-10-CM | POA: Insufficient documentation

## 2016-12-01 DIAGNOSIS — F17210 Nicotine dependence, cigarettes, uncomplicated: Secondary | ICD-10-CM | POA: Insufficient documentation

## 2016-12-01 DIAGNOSIS — R45851 Suicidal ideations: Secondary | ICD-10-CM | POA: Insufficient documentation

## 2016-12-01 LAB — CBC
HCT: 39.8 % (ref 39.0–52.0)
Hemoglobin: 13.4 g/dL (ref 13.0–17.0)
MCH: 32.5 pg (ref 26.0–34.0)
MCHC: 33.7 g/dL (ref 30.0–36.0)
MCV: 96.6 fL (ref 78.0–100.0)
PLATELETS: 318 10*3/uL (ref 150–400)
RBC: 4.12 MIL/uL — ABNORMAL LOW (ref 4.22–5.81)
RDW: 13 % (ref 11.5–15.5)
WBC: 7.5 10*3/uL (ref 4.0–10.5)

## 2016-12-01 LAB — RAPID URINE DRUG SCREEN, HOSP PERFORMED
Amphetamines: NOT DETECTED
BENZODIAZEPINES: NOT DETECTED
Barbiturates: NOT DETECTED
COCAINE: NOT DETECTED
Opiates: NOT DETECTED
Tetrahydrocannabinol: NOT DETECTED

## 2016-12-01 LAB — COMPREHENSIVE METABOLIC PANEL
ALK PHOS: 73 U/L (ref 38–126)
ALT: 14 U/L — AB (ref 17–63)
AST: 24 U/L (ref 15–41)
Albumin: 3.5 g/dL (ref 3.5–5.0)
Anion gap: 6 (ref 5–15)
BILIRUBIN TOTAL: 0.7 mg/dL (ref 0.3–1.2)
BUN: 5 mg/dL — AB (ref 6–20)
CALCIUM: 8.5 mg/dL — AB (ref 8.9–10.3)
CO2: 23 mmol/L (ref 22–32)
CREATININE: 0.75 mg/dL (ref 0.61–1.24)
Chloride: 105 mmol/L (ref 101–111)
Glucose, Bld: 99 mg/dL (ref 65–99)
Potassium: 4 mmol/L (ref 3.5–5.1)
Sodium: 134 mmol/L — ABNORMAL LOW (ref 135–145)
TOTAL PROTEIN: 7.4 g/dL (ref 6.5–8.1)

## 2016-12-01 LAB — ETHANOL

## 2016-12-01 MED ORDER — NICOTINE 21 MG/24HR TD PT24: 21.0000 mg | MEDICATED_PATCH | Freq: Every day | TRANSDERMAL | Status: DC

## 2016-12-01 MED ORDER — THIAMINE HCL 100 MG/ML IJ SOLN: 100.0000 mg | Freq: Every day | INTRAMUSCULAR | Status: DC

## 2016-12-01 MED ORDER — HALOPERIDOL 5 MG PO TABS: 5.0000 mg | ORAL_TABLET | Freq: Every day | ORAL | Status: DC

## 2016-12-01 MED ORDER — ACETAMINOPHEN 325 MG PO TABS
650.0000 mg | ORAL_TABLET | ORAL | Status: DC | PRN
Start: 2016-12-01 — End: 2016-12-01

## 2016-12-01 MED ORDER — LORAZEPAM 2 MG/ML IJ SOLN: 0.0000 mg | Freq: Four times a day (QID) | INTRAMUSCULAR | Status: DC

## 2016-12-01 MED ORDER — ONDANSETRON HCL 4 MG PO TABS: 4.0000 mg | ORAL_TABLET | Freq: Three times a day (TID) | ORAL | Status: DC | PRN

## 2016-12-01 MED ORDER — FAMOTIDINE 20 MG PO TABS: 20.0000 mg | ORAL_TABLET | Freq: Two times a day (BID) | ORAL | Status: DC

## 2016-12-01 MED ORDER — LORAZEPAM 1 MG PO TABS: 2.0000 mg | ORAL_TABLET | Freq: Once | ORAL | Status: DC

## 2016-12-01 MED ORDER — LORAZEPAM 2 MG/ML IJ SOLN: 0.0000 mg | Freq: Two times a day (BID) | INTRAMUSCULAR | Status: DC

## 2016-12-01 MED ORDER — FOLIC ACID 1 MG PO TABS: 1.0000 mg | ORAL_TABLET | Freq: Every day | ORAL | Status: DC

## 2016-12-01 MED ORDER — VITAMIN B-1 100 MG PO TABS: 100.0000 mg | ORAL_TABLET | Freq: Every day | ORAL | Status: DC

## 2016-12-01 MED ORDER — LORAZEPAM 1 MG PO TABS: 0.0000 mg | ORAL_TABLET | Freq: Two times a day (BID) | ORAL | Status: DC

## 2016-12-01 MED ORDER — ALUM & MAG HYDROXIDE-SIMETH 200-200-20 MG/5ML PO SUSP: 30.0000 mL | Freq: Four times a day (QID) | ORAL | Status: DC | PRN

## 2016-12-01 MED ORDER — LORAZEPAM 1 MG PO TABS: 0.0000 mg | ORAL_TABLET | Freq: Four times a day (QID) | ORAL | Status: DC
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
## 2016-12-01 NOTE — ED Triage Notes (Signed)
The pt reports that he is suicidal he has all his clothes  He denies that he has been here in the past few days for this

## 2016-12-01 NOTE — ED Notes (Signed)
AVS reviewed with pt and belongings returned. Pt denies hi/si. Ambulatory out of dept. Verbalizes understanding to follow up at [facility] today
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
## 2016-12-01 NOTE — ED Notes (Signed)
TTS consult in progress. °

## 2016-12-01 NOTE — ED Notes (Signed)
[name] brown blanket placed in Locker [employer] in [employer].

## 2016-12-01 NOTE — ED Notes (Signed)
Sitter requested through staffing hes on the list changed into burgundy scrubs charge  Aware   Security to wand

## 2016-12-01 NOTE — Discharge Instructions (Signed)
Please follow with your primary care doctor in the next 2 days for a check-up. They must obtain records for further management.  ° °Do not hesitate to return to the Emergency Department for any new, worsening or concerning symptoms.  ° °

## 2016-12-01 NOTE — ED Triage Notes (Addendum)
The pt came through triage and reported that he was homeless and he needed to stay in the waiting romm out of the rain when he was told he could not stay he  Then said he would check in that he was suicidal and hearing voices  He was just seen earlier in the shift for something else

## 2016-12-01 NOTE — ED Notes (Signed)
Pt to shower with sitter at this time. Denies HI/ SI at

## 2016-12-01 NOTE — Progress Notes (Signed)
Patient's case discussed with Dr. Lucianne MussKumar following TTS reassessment this morning. Patient is currently denying any SI/HI/VAH  Per Dr. Remus BlakeKumar's recommendations: Discharge Home [facility] and medication management Take all medications as prescribed Avoid the use of alcohol and/or drugs Stay well hydrated Activity as tolerated Follow up with PCP for any new or existing medical concerns   Signed: Justina A. Beryle Lathekonkwo, NP

## 2016-12-15 ENCOUNTER — Encounter (HOSPITAL_COMMUNITY): Payer: Self-pay | Admitting: Emergency Medicine

## 2016-12-15 ENCOUNTER — Emergency Department (HOSPITAL_COMMUNITY)
Admission: EM | Admit: 2016-12-15 | Discharge: 2016-12-15 | Disposition: A | Discharge status: Home / Self Care | Payer: Self-pay | Attending: Emergency Medicine | Admitting: Emergency Medicine

## 2016-12-15 DIAGNOSIS — Z5321 Procedure and treatment not carried out due to patient leaving prior to being seen by health care provider: Secondary | ICD-10-CM | POA: Insufficient documentation

## 2016-12-15 DIAGNOSIS — R21 Rash and other nonspecific skin eruption: Secondary | ICD-10-CM | POA: Insufficient documentation

## 2016-12-15 NOTE — ED Triage Notes (Signed)
Per EMS-pt approached [facility] with concern about rash on l/index finger. Rash appears to be spreading to 3 rd finger. Pt c/o red, itching rash x 2 week. Pt is alert, oriented and ambulatory.

## 2016-12-15 NOTE — ED Triage Notes (Signed)
Called to room x 1  - no answer  

## 2016-12-15 NOTE — ED Triage Notes (Signed)
Pt did not answer to name when called to treatment room x 2

## 2016-12-20 ENCOUNTER — Encounter (HOSPITAL_COMMUNITY): Payer: Self-pay | Admitting: *Deleted

## 2016-12-20 ENCOUNTER — Emergency Department (HOSPITAL_COMMUNITY)
Admission: EM | Admit: 2016-12-20 | Discharge: 2016-12-20 | Disposition: A | Discharge status: Home / Self Care | Payer: Self-pay | Attending: Emergency Medicine | Admitting: Emergency Medicine

## 2016-12-20 DIAGNOSIS — L309 Dermatitis, unspecified: Secondary | ICD-10-CM | POA: Insufficient documentation

## 2016-12-20 DIAGNOSIS — R2232 Localized swelling, mass and lump, left upper limb: Secondary | ICD-10-CM | POA: Insufficient documentation

## 2016-12-20 DIAGNOSIS — F17210 Nicotine dependence, cigarettes, uncomplicated: Secondary | ICD-10-CM | POA: Insufficient documentation

## 2016-12-20 DIAGNOSIS — Z79899 Other long term (current) drug therapy: Secondary | ICD-10-CM | POA: Insufficient documentation

## 2016-12-20 DIAGNOSIS — L03114 Cellulitis of left upper limb: Secondary | ICD-10-CM | POA: Insufficient documentation

## 2016-12-20 MED ORDER — SULFAMETHOXAZOLE-TRIMETHOPRIM 800-160 MG PO TABS: 1.0000 | ORAL_TABLET | Freq: Two times a day (BID) | ORAL | 0 refills | Status: DC

## 2016-12-20 MED ORDER — CLOTRIMAZOLE-BETAMETHASONE 1-0.05 % EX CREA: TOPICAL_CREAM | CUTANEOUS | 0 refills | Status: DC

## 2016-12-20 NOTE — ED Notes (Signed)
Bed: WA09 Expected date:  Expected time:  Means of arrival:  Comments: No bed 
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
## 2016-12-20 NOTE — ED Notes (Signed)
Called Pt in lobby for vital recheck, no response. 

## 2016-12-20 NOTE — Discharge Instructions (Signed)
Keep wound clean and dry, if bandages get moist then change the bandages, making sure to keep them as dry as possible. Use lotrisone cream as directed. Take antibiotic until it is finished. Alternate between tylenol and motrin for pain. Use over the counter antihistamine like zyrtec or claritin to help symptoms. [facility] in 3-5 days for recheck of symptoms and for ongoing medical care. Monitor area for signs of infection to include, but not limited to: increasing pain, spreading redness, drainage/pus, worsening swelling, or fevers. Return to emergency department for emergent changing or worsening symptoms.

## 2016-12-20 NOTE — ED Triage Notes (Signed)
Skin irritation on fingers of left hand from scatching skin, swelling noted along with inflammation

## 2016-12-29 ENCOUNTER — Emergency Department (HOSPITAL_COMMUNITY)
Admission: EM | Admit: 2016-12-29 | Discharge: 2016-12-29 | Disposition: A | Discharge status: Home / Self Care | Payer: Self-pay | Attending: Emergency Medicine | Admitting: Emergency Medicine

## 2016-12-29 ENCOUNTER — Encounter (HOSPITAL_COMMUNITY): Payer: Self-pay

## 2016-12-29 DIAGNOSIS — F17210 Nicotine dependence, cigarettes, uncomplicated: Secondary | ICD-10-CM | POA: Insufficient documentation

## 2016-12-29 DIAGNOSIS — R21 Rash and other nonspecific skin eruption: Secondary | ICD-10-CM | POA: Insufficient documentation

## 2016-12-29 MED ORDER — HYDROCERIN EX CREA
TOPICAL_CREAM | Freq: Two times a day (BID) | CUTANEOUS | Status: DC
  Administered 2016-12-29: 1 via TOPICAL
  Filled 2016-12-29: qty 113

## 2016-12-29 MED ORDER — DOXYCYCLINE HYCLATE 100 MG PO CAPS: 100.0000 mg | ORAL_CAPSULE | Freq: Two times a day (BID) | ORAL | 0 refills | Status: DC

## 2016-12-29 MED ORDER — DIPHENHYDRAMINE HCL 25 MG PO CAPS: 25.0000 mg | ORAL_CAPSULE | Freq: Three times a day (TID) | ORAL | 0 refills | Status: DC | PRN

## 2016-12-29 NOTE — ED Notes (Signed)
Gave pt Coke Cola, per [facility] - Char[facility] fundraiser.

## 2016-12-29 NOTE — ED Notes (Signed)
ED Provider at bedside. 

## 2016-12-29 NOTE — ED Notes (Signed)
Pt updated on delay, advised we will discharge him once we have the medication the provider ordered that has to come from pharmacy. Pt verbalized understanding, provided cold drink while he waits.

## 2016-12-29 NOTE — Discharge Instructions (Signed)
Please buy EUCERIN CREAM and apply that to the skin. Please take the medicine prescribed for likely infection to the skin.  Return to the ER if the symptoms get worse - otherwise come back in 2 days for a recheck to ensure you are healing well. Stop all other topical hand meds.

## 2016-12-29 NOTE — ED Triage Notes (Signed)
[facility]- pt coming in for rash bilaterally on hands and feet making it painful to walk. Pt alert and oriented, vitals stable with EMS. Cracking and dryness noted to hands.
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
## 2017-01-03 ENCOUNTER — Encounter (HOSPITAL_COMMUNITY): Payer: Self-pay | Admitting: Emergency Medicine

## 2017-01-03 ENCOUNTER — Emergency Department (HOSPITAL_COMMUNITY)
Admission: EM | Admit: 2017-01-03 | Discharge: 2017-01-04 | Disposition: A | Discharge status: Home / Self Care | Payer: Self-pay | Attending: Emergency Medicine | Admitting: Emergency Medicine

## 2017-01-03 DIAGNOSIS — R21 Rash and other nonspecific skin eruption: Secondary | ICD-10-CM | POA: Insufficient documentation

## 2017-01-03 DIAGNOSIS — F17210 Nicotine dependence, cigarettes, uncomplicated: Secondary | ICD-10-CM | POA: Insufficient documentation

## 2017-01-03 DIAGNOSIS — L03114 Cellulitis of left upper limb: Secondary | ICD-10-CM | POA: Insufficient documentation

## 2017-01-03 DIAGNOSIS — M79641 Pain in right hand: Secondary | ICD-10-CM | POA: Insufficient documentation

## 2017-01-03 DIAGNOSIS — M79642 Pain in left hand: Secondary | ICD-10-CM | POA: Insufficient documentation

## 2017-01-03 NOTE — ED Triage Notes (Signed)
Pt states his hands are burning and aching  [name] hands are red and peeling  Pt states he has tried stuff from the store without relief

## 2017-01-03 NOTE — ED Notes (Signed)
Bed: [facility] Expected date:  Expected time:  Means of arrival:  Comments: held

## 2017-01-03 NOTE — ED Notes (Signed)
Pt called from the lobby with no response 

## 2017-01-04 LAB — CBC WITH DIFFERENTIAL/PLATELET
BASOS ABS: 0.1 10*3/uL (ref 0.0–0.1)
BASOS PCT: 1 %
EOS ABS: 2 10*3/uL — AB (ref 0.0–0.7)
Eosinophils Relative: 26 %
HCT: 39.8 % (ref 39.0–52.0)
Hemoglobin: 14 g/dL (ref 13.0–17.0)
Lymphocytes Relative: 32 %
Lymphs Abs: 2.4 10*3/uL (ref 0.7–4.0)
MCH: 33.3 pg (ref 26.0–34.0)
MCHC: 35.2 g/dL (ref 30.0–36.0)
MCV: 94.5 fL (ref 78.0–100.0)
Monocytes Absolute: 0.6 10*3/uL (ref 0.1–1.0)
Monocytes Relative: 8 %
NEUTROS PCT: 33 %
Neutro Abs: 2.5 10*3/uL (ref 1.7–7.7)
PLATELETS: 276 10*3/uL (ref 150–400)
RBC: 4.21 MIL/uL — AB (ref 4.22–5.81)
RDW: 13.1 % (ref 11.5–15.5)
WBC: 7.6 10*3/uL (ref 4.0–10.5)

## 2017-01-04 LAB — I-STAT CHEM 8, ED
BUN: 7 mg/dL (ref 6–20)
CALCIUM ION: 1.07 mmol/L — AB (ref 1.15–1.40)
CHLORIDE: 107 mmol/L (ref 101–111)
Creatinine, Ser: 0.6 mg/dL — ABNORMAL LOW (ref 0.61–1.24)
Glucose, Bld: 96 mg/dL (ref 65–99)
HCT: 43 % (ref 39.0–52.0)
Hemoglobin: 14.6 g/dL (ref 13.0–17.0)
POTASSIUM: 5.1 mmol/L (ref 3.5–5.1)
SODIUM: 138 mmol/L (ref 135–145)
TCO2: 22 mmol/L (ref 22–32)

## 2017-01-04 MED ORDER — CEPHALEXIN 500 MG PO CAPS: 500.0000 mg | ORAL_CAPSULE | Freq: Four times a day (QID) | ORAL | 0 refills | Status: DC

## 2017-01-04 MED ORDER — CEPHALEXIN 500 MG PO CAPS
500.0000 mg | ORAL_CAPSULE | Freq: Once | ORAL | Status: AC
  Administered 2017-01-04: 500 mg via ORAL
  Filled 2017-01-04: qty 1
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
## 2017-04-06 ENCOUNTER — Emergency Department (HOSPITAL_COMMUNITY)
Admission: EM | Admit: 2017-04-06 | Discharge: 2017-04-08 | Disposition: A | Discharge status: Other Acute Inpatient Hospital | Payer: Self-pay | Attending: Emergency Medicine | Admitting: Emergency Medicine

## 2017-04-06 ENCOUNTER — Encounter (HOSPITAL_COMMUNITY): Payer: Self-pay | Admitting: Emergency Medicine

## 2017-04-06 DIAGNOSIS — Z590 Homelessness: Secondary | ICD-10-CM | POA: Insufficient documentation

## 2017-04-06 DIAGNOSIS — R440 Auditory hallucinations: Secondary | ICD-10-CM | POA: Insufficient documentation

## 2017-04-06 DIAGNOSIS — Z046 Encounter for general psychiatric examination, requested by authority: Secondary | ICD-10-CM | POA: Insufficient documentation

## 2017-04-06 DIAGNOSIS — Z79899 Other long term (current) drug therapy: Secondary | ICD-10-CM | POA: Insufficient documentation

## 2017-04-06 DIAGNOSIS — R45851 Suicidal ideations: Secondary | ICD-10-CM | POA: Insufficient documentation

## 2017-04-06 DIAGNOSIS — F17210 Nicotine dependence, cigarettes, uncomplicated: Secondary | ICD-10-CM | POA: Insufficient documentation

## 2017-04-06 DIAGNOSIS — F1994 Other psychoactive substance use, unspecified with psychoactive substance-induced mood disorder: Secondary | ICD-10-CM | POA: Diagnosis present

## 2017-04-06 DIAGNOSIS — F329 Major depressive disorder, single episode, unspecified: Secondary | ICD-10-CM | POA: Insufficient documentation

## 2017-04-06 DIAGNOSIS — F19929 Other psychoactive substance use, unspecified with intoxication, unspecified: Secondary | ICD-10-CM

## 2017-04-06 DIAGNOSIS — R45850 Homicidal ideations: Secondary | ICD-10-CM | POA: Insufficient documentation

## 2017-04-06 DIAGNOSIS — R110 Nausea: Secondary | ICD-10-CM | POA: Insufficient documentation

## 2017-04-06 DIAGNOSIS — F1094 Alcohol use, unspecified with alcohol-induced mood disorder: Secondary | ICD-10-CM | POA: Diagnosis present

## 2017-04-06 LAB — COMPREHENSIVE METABOLIC PANEL
ALBUMIN: 4.3 g/dL (ref 3.5–5.0)
ALK PHOS: 82 U/L (ref 38–126)
ALT: 27 U/L (ref 17–63)
AST: 39 U/L (ref 15–41)
Anion gap: 17 — ABNORMAL HIGH (ref 5–15)
BUN: 15 mg/dL (ref 6–20)
CO2: 32 mmol/L (ref 22–32)
CREATININE: 1.23 mg/dL (ref 0.61–1.24)
Calcium: 10.3 mg/dL (ref 8.9–10.3)
Chloride: 79 mmol/L — ABNORMAL LOW (ref 101–111)
GFR calc Af Amer: >60 mL/min (ref >60)
GFR calc non Af Amer: >60 mL/min (ref >60)
GLUCOSE: 112 mg/dL — AB (ref 65–99)
Potassium: 3 mmol/L — ABNORMAL LOW (ref 3.5–5.1)
SODIUM: 128 mmol/L — AB (ref 135–145)
Total Bilirubin: 0.7 mg/dL (ref 0.3–1.2)
Total Protein: 8.5 g/dL — ABNORMAL HIGH (ref 6.5–8.1)

## 2017-04-06 LAB — CBC WITH DIFFERENTIAL/PLATELET
BASOS ABS: 0.1 10*3/uL (ref 0.0–0.1)
BASOS PCT: 1 %
EOS ABS: 0.4 10*3/uL (ref 0.0–0.7)
Eosinophils Relative: 4 %
HCT: 44.3 % (ref 39.0–52.0)
Hemoglobin: 15.6 g/dL (ref 13.0–17.0)
Lymphocytes Relative: 19 %
Lymphs Abs: 1.8 10*3/uL (ref 0.7–4.0)
MCH: 32.6 pg (ref 26.0–34.0)
MCHC: 35.2 g/dL (ref 30.0–36.0)
MCV: 92.5 fL (ref 78.0–100.0)
MONOS PCT: 12 %
Monocytes Absolute: 1.2 10*3/uL — ABNORMAL HIGH (ref 0.1–1.0)
Neutro Abs: 6.2 10*3/uL (ref 1.7–7.7)
Neutrophils Relative %: 64 %
Platelets: 352 10*3/uL (ref 150–400)
RBC: 4.79 MIL/uL (ref 4.22–5.81)
RDW: 12.9 % (ref 11.5–15.5)
WBC: 9.6 10*3/uL (ref 4.0–10.5)

## 2017-04-06 LAB — RAPID URINE DRUG SCREEN, HOSP PERFORMED
AMPHETAMINES: NOT DETECTED
BARBITURATES: NOT DETECTED
Benzodiazepines: NOT DETECTED
Cocaine: NOT DETECTED
OPIATES: NOT DETECTED
TETRAHYDROCANNABINOL: NOT DETECTED

## 2017-04-06 LAB — ACETAMINOPHEN LEVEL: Acetaminophen (Tylenol), Serum: <10 ug/mL — ABNORMAL LOW (ref 10–30)

## 2017-04-06 LAB — SALICYLATE LEVEL: Salicylate Lvl: <7 mg/dL (ref 2.8–30.0)

## 2017-04-06 LAB — ETHANOL: Alcohol, Ethyl (B): <10 mg/dL (ref <10)

## 2017-04-06 MED ORDER — THIAMINE HCL 100 MG/ML IJ SOLN
100.0000 mg | Freq: Every day | INTRAMUSCULAR | Status: DC
  Filled 2017-04-06: qty 2

## 2017-04-06 MED ORDER — LORAZEPAM 2 MG/ML IJ SOLN: 0.0000 mg | Freq: Two times a day (BID) | INTRAMUSCULAR | Status: DC

## 2017-04-06 MED ORDER — ONDANSETRON HCL 4 MG PO TABS
4.0000 mg | ORAL_TABLET | Freq: Three times a day (TID) | ORAL | Status: DC | PRN
  Administered 2017-04-06: 4 mg via ORAL
  Filled 2017-04-06: qty 1

## 2017-04-06 MED ORDER — LORAZEPAM 1 MG PO TABS: 0.0000 mg | ORAL_TABLET | Freq: Two times a day (BID) | ORAL | Status: DC

## 2017-04-06 MED ORDER — VITAMIN B-1 100 MG PO TABS
100.0000 mg | ORAL_TABLET | Freq: Every day | ORAL | Status: DC
  Administered 2017-04-06 – 2017-04-08 (×3): 100 mg via ORAL
  Filled 2017-04-06 (×4): qty 1

## 2017-04-06 MED ORDER — LORAZEPAM 2 MG/ML IJ SOLN
0.0000 mg | Freq: Four times a day (QID) | INTRAMUSCULAR | Status: DC
  Filled 2017-04-06: qty 1

## 2017-04-06 MED ORDER — LORAZEPAM 1 MG PO TABS
0.0000 mg | ORAL_TABLET | Freq: Four times a day (QID) | ORAL | Status: DC
  Administered 2017-04-06 – 2017-04-07 (×2): 1 mg via ORAL
  Filled 2017-04-06 (×2): qty 1

## 2017-04-06 NOTE — ED Provider Notes (Signed)
Patient placed in Quick Look pathway, seen and evaluated   Chief Complaint: suicidal and homicidal HPI:   Pt reports he plans to cut his throat  ROS: no cough no congestion  Physical Exam:   Gen: No distress  Neuro: Awake and Alert  Skin: Warm    Focused Exam: Lung clear   Initiation of care has begun. The patient has been counseled on the process, plan, and necessity for staying for the completion/evaluation, and the remainder of the medical screening examination   Osie CheeksSofia, Leslie K, PA-C 04/06/17 1907    Terrilee FilesButler, Michael C, MD 04/07/17 [phone]
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
## 2017-04-06 NOTE — ED Notes (Signed)
Patient placed in paper scrubs and all belongings placed in belonging bags and stored in triage.

## 2017-04-06 NOTE — ED Triage Notes (Signed)
Patient brought in by [employer] voluntarily for SI and HI. Patient reports his plan is to break a bottle and kill himself with it after he kills someone else. Denies any one specific person he wants to kill/harm besides himself.
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
## 2017-04-06 NOTE — ED Notes (Signed)
Patient is being wanded by security. Gave report to The AcreageNikki, Charity fundraiserN. Patient ambulated to [facility] with 5 white patent belongings bag and a yellow/black book bag. Patient ambulated with a steady assistance.

## 2017-04-07 DIAGNOSIS — Z811 Family history of alcohol abuse and dependence: Secondary | ICD-10-CM

## 2017-04-07 DIAGNOSIS — F1994 Other psychoactive substance use, unspecified with psychoactive substance-induced mood disorder: Secondary | ICD-10-CM

## 2017-04-07 DIAGNOSIS — R45851 Suicidal ideations: Secondary | ICD-10-CM

## 2017-04-07 DIAGNOSIS — R45850 Homicidal ideations: Secondary | ICD-10-CM

## 2017-04-07 DIAGNOSIS — F17210 Nicotine dependence, cigarettes, uncomplicated: Secondary | ICD-10-CM

## 2017-04-07 LAB — AMYLASE: Amylase: 79 U/L (ref 28–100)

## 2017-04-07 LAB — LIPASE, BLOOD: Lipase: 53 U/L — ABNORMAL HIGH (ref 11–51)

## 2017-04-07 MED ORDER — GI COCKTAIL ~~LOC~~
30.0000 mL | Freq: Once | ORAL | Status: AC
  Administered 2017-04-07: 30 mL via ORAL
  Filled 2017-04-07 (×2): qty 30

## 2017-04-07 MED ORDER — FAMOTIDINE 20 MG PO TABS
20.0000 mg | ORAL_TABLET | Freq: Every day | ORAL | Status: DC
  Administered 2017-04-07 – 2017-04-08 (×2): 20 mg via ORAL
  Filled 2017-04-07 (×2): qty 1

## 2017-04-07 MED ORDER — POTASSIUM CHLORIDE CRYS ER 20 MEQ PO TBCR
40.0000 meq | EXTENDED_RELEASE_TABLET | Freq: Once | ORAL | Status: AC
  Administered 2017-04-07: 40 meq via ORAL
  Filled 2017-04-07: qty 2

## 2017-04-07 NOTE — [facility] Counselor (Signed)
Ron[facility] called to inquire about status. States that pt would need to be IVCd in order to transport to [facility]. Pt is under review at their facility but has not been officially accepted. Will inquire if pt is able to be IVC'd.   Phone # for [facility]: [phone]

## 2017-04-07 NOTE — Patient Outreach (Signed)
ED Peer Support Specialist Patient Intake (Complete at intake & 30-60 Day Follow-up)  Name: Jimmy [name]  MRN: [id number]  Age: 62 y.o.   Date of Admission: 04/07/2017  Intake:   Comments:      Primary Reason Admitted:  Pt states "I been having suicidal thoughts with a plan to break a bottle and stab myself in the neck for couple of days."  Pt did not identify a specific individual he wanted to hurt or kill; but stated, "I may kill someone, I just don't know who."  Pt reports hearing voices with command telling him to "kill somebody" or "kill myself".  Pt became agitated and irritated during the assessment.  Pt reports "drinking a 6 pack of beer daily" with last use being a couple of days ago.  Pt was not able to contract for safety and stated "I'm not going to say that I'm not going to hurt myself or someone else because I don't know."    Lab values: Alcohol/ETOH: Negative Positive UDS? No Amphetamines: No Barbiturates: No Benzodiazepines: No Cocaine: No Opiates: No Cannabinoids: No  Demographic information: Gender: Male Ethnicity: White Marital Status: Widowed Insurance Status: Uninsured/Self-pay Ecologist (Work Neurosurgeon, Physicist, medical, etc.: No Lives with: Sibling Living situation: House/Apartment  Reported Patient History: Patient reported health conditions: Depression Patient aware of HIV and hepatitis status: No  In past year, has patient visited ED for any reason? No  Number of ED visits:    Reason(s) for visit:    In past year, has patient been hospitalized for any reason? No  Number of hospitalizations:    Reason(s) for hospitalization:    In past year, has patient been arrested? Yes  Number of arrests:    Reason(s) for arrest: tresspassing but turned himself in an its over.  In past year, has patient been incarcerated? No  Number of incarcerations:    Reason(s) for incarceration:    In past year, has patient  received medication-assisted treatment? No  In past year, patient received the following treatments: Group therapy, Individual therapy, Residential treatment (non-hospital)  In past year, has patient received any harm reduction services? No  Did this include any of the following?    In past year, has patient received care from a mental health provider for diagnosis other than SUD? No  In past year, is this first time patient has overdosed? No  Number of past overdoses:    In past year, is this first time patient has been hospitalized for an overdose? No  Number of hospitalizations for overdose(s):    Is patient currently receiving treatment for a mental health diagnosis? No  Patient reports experiencing difficulty participating in SUD treatment: No    Most important reason(s) for this difficulty?    Has patient received prior services for treatment? No  In past, patient has received services from following agencies:    Plan of Care:  Suggested follow up at these agencies/treatment centers: Partial hospitalization(Stated he wants help for wanting to Harm himself and others. )  Other information: CPSS met with Pt for motivational interviewing and to monitor services. CPSS processed with Pt to understand what services he wanted to receive. CPSS was made aware that Pt has thoughts of harming himself and others. CPSS addressed Pt that CPSS will follow up with Pt after speaking with RN.    Aaron Edelman Jimmy [name], [place]  04/07/2017 11:41 AM

## 2017-04-07 NOTE — ED Notes (Signed)
Patient alert and oriented taking shower now.
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
## 2017-04-07 NOTE — ED Notes (Signed)
Pt A&O x 3, no distress noted, watching TV at present.  Monitoring for safety, Q 15 min checks in effect. 

## 2017-04-07 NOTE — BHH Counselor (Signed)
Clinician [facility]    Redmond Pullingreylese D Louine Tenpenny, MS, [facility], [facility] Triage Specialist [phone]

## 2017-04-07 NOTE — Progress Notes (Signed)
CSW received [facility] and was told that if pt can keep his fluids down" and pt's labs stabilize the pt can be considered for admission on 1/16.  In addition Brandy states the Amilase lab,  Lipase labs and an EKG are needed and as long as nausea and vomiting are under control and the lab results are accepted the pt can "likely" be accepted.  If these are submitted by fax [phone] attn to Nurse's Station on 1/15 the pt's referral can continue to be reviewed.  CSW will update EDP and RN.  CSW will continue to follow for D/C needs.  Jimmy PeaJonathan F. Jessica Seidman, LCSW, LCAS, CSI Clinical Social Worker Ph: [phone]

## 2017-04-07 NOTE — ED Notes (Signed)
Patient reports he is feeling good this morning.  No symptoms of ETOH withdrawal.  Patient asleep, was awakened to give medication.

## 2017-04-07 NOTE — BH Assessment (Signed)
BHH Assessment Progress Note  Per Nelly RoutArchana Kumar, MD, this pt would benefit from psychiatric hospitalization at this time.  The following facilities have been contacted to seek placement for this pt, with results as noted:  Beds available, information sent, decision pending:  [name]   At capacity:  [facility]   Deondra Labrador, [place] Coordinator [phone]

## 2017-04-07 NOTE — ED Notes (Signed)
Patient admitted on unit. Patient continues to endorse SI/HI but contracts for safety and agrees to come to staff when needed. Denies pain. Will continue to monitor patient.

## 2017-04-07 NOTE — ED Notes (Signed)
Phlebotomy contacted to obtain labs from patient.

## 2017-04-08 DIAGNOSIS — R440 Auditory hallucinations: Secondary | ICD-10-CM

## 2017-04-08 DIAGNOSIS — F19929 Other psychoactive substance use, unspecified with intoxication, unspecified: Secondary | ICD-10-CM

## 2017-04-08 DIAGNOSIS — Z590 Homelessness: Secondary | ICD-10-CM

## 2017-04-08 DIAGNOSIS — R4587 Impulsiveness: Secondary | ICD-10-CM

## 2017-04-08 DIAGNOSIS — F1094 Alcohol use, unspecified with alcohol-induced mood disorder: Secondary | ICD-10-CM

## 2017-04-08 LAB — COMPREHENSIVE METABOLIC PANEL
ALBUMIN: 3.6 g/dL (ref 3.5–5.0)
ALT: 18 U/L (ref 17–63)
AST: 27 U/L (ref 15–41)
Alkaline Phosphatase: 63 U/L (ref 38–126)
Anion gap: 7 (ref 5–15)
BILIRUBIN TOTAL: 0.7 mg/dL (ref 0.3–1.2)
BUN: 26 mg/dL — AB (ref 6–20)
CHLORIDE: 90 mmol/L — AB (ref 101–111)
CO2: 30 mmol/L (ref 22–32)
CREATININE: 1.05 mg/dL (ref 0.61–1.24)
Calcium: 8.7 mg/dL — ABNORMAL LOW (ref 8.9–10.3)
GFR calc Af Amer: >60 mL/min (ref >60)
GLUCOSE: 93 mg/dL (ref 65–99)
Potassium: 3.4 mmol/L — ABNORMAL LOW (ref 3.5–5.1)
Sodium: 127 mmol/L — ABNORMAL LOW (ref 135–145)
Total Protein: 7.2 g/dL (ref 6.5–8.1)

## 2017-04-08 MED ORDER — GABAPENTIN 300 MG PO CAPS
300.0000 mg | ORAL_CAPSULE | Freq: Three times a day (TID) | ORAL | Status: DC
  Administered 2017-04-08 (×2): 300 mg via ORAL
  Filled 2017-04-08: qty 3
  Filled 2017-04-08: qty 1

## 2017-04-08 MED ORDER — ENSURE ENLIVE PO LIQD
237.0000 mL | Freq: Two times a day (BID) | ORAL | Status: DC
  Administered 2017-04-08: 237 mL via ORAL
  Filled 2017-04-08: qty 237

## 2017-04-08 NOTE — ED Provider Notes (Signed)
6:40 PM [name] Dr. Hinda LenisAugustine.  Patient feels well and has no acute complaints.  His vital signs are unremarkable at this time.   Pricilla LovelessGoldston, Toyna Erisman, MD 04/08/17 [phone]

## 2017-04-08 NOTE — ED Notes (Signed)
IVC has been rescinded 
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
## 2017-04-08 NOTE — ED Notes (Signed)
On the phone 

## 2017-04-08 NOTE — ED Notes (Signed)
Chaplin into see 

## 2017-04-08 NOTE — Progress Notes (Signed)
CSW reviewed chart and did not note pt's labs and EKG were [facility] by fax to [phone] as requested on 1/15. CSW faxed requested Amilase lab,  Lipase labs and EKG.  CSW will continue to follow for D/C needs.  Dorothe PeaJonathan F. Marlana Mckowen, LCSW, LCAS, CSI Clinical Social Worker Ph: [phone]

## 2017-04-08 NOTE — ED Notes (Signed)
[facility] called for transport

## 2017-04-08 NOTE — ED Provider Notes (Signed)
The patient is medically cleared  Na of 127 is noted and is secondary to ETOH abuse and poor nutrition. With abstinence of ETOH and improved diet and oral hydration this should improve. No indication for acute hospitalization for medical reasons at this time. His sodium will need to be rechecked by his primary care physician in 1 week.    Azalia Bilisampos, Lyndol Vanderheiden, MD 04/08/17 1344

## 2017-04-08 NOTE — ED Notes (Signed)
Lab contacted for blood draw.

## 2017-04-08 NOTE — ED Notes (Signed)
[facility]fterry [name] updated (meds/vs) and is aware that the pt is in transit.  Pt ambulatory w/o difficulty with [facility], Voluntary admission papers, EKG, face sheet, assessment note, MAR report, transfer report, belongings, EMTala given to [facility].

## 2017-04-08 NOTE — Progress Notes (Signed)
   04/08/17 1200  Clinical Encounter Type  Visited With Patient  Visit Type Follow-up;Psychological support;Spiritual support;Behavioral Health;ED  Referral From Nurse  Consult/Referral To Chaplain  Spiritual Encounters  Spiritual Needs Emotional;Other (Comment) (Clothing )  Stress Factors  Patient Stress Factors Other (Comment);Financial concerns Recruitment consultant(Clothing )   I visited with the patient who had requested clothing. He stated that he is homeless and does not have anything to wear. I provided clothing for him.  The patient stated that he is dealing with financial issues since the death of his wife. The patient stated that he hopes to be able to get the finances to get housing. He stated that he was hopeful that his financial situation would improve.   Please, contact Spiritual Care for further assistance.   Chaplain Clint BolderBrittany Rozalynn Buege M.Div., [facility]

## 2017-04-08 NOTE — ED Notes (Signed)
[name] still experiencing phone difficult's, reports called to D. Hermelinda DellenBerryman RN

## 2017-04-08 NOTE — ED Notes (Signed)
EDP into see 

## 2017-04-08 NOTE — ED Notes (Addendum)
Dr Norman and Sharma CovertJacki ConesLaurie NP into see.  Pt reports that he does not have and OP psych provider, and that he hears voices. PT also reports that he is homeless and "want to stay out of the cold weather."

## 2017-04-08 NOTE — ED Notes (Signed)
Dr Patria Maneampos updated, will order ensure, encourage fluids, does not require additional treatment at this time, but does need to follow up with MD after dc.

## 2017-04-08 NOTE — ED Notes (Signed)
Snack given.

## 2017-04-08 NOTE — ED Notes (Signed)
Up to the bathroom 

## 2017-04-08 NOTE — ED Notes (Signed)
[facility]new fax number provided by [facility]

## 2017-04-08 NOTE — ED Notes (Addendum)
On the phone, ot is aware that his ride will be here soon.

## 2017-04-17 ENCOUNTER — Encounter (HOSPITAL_COMMUNITY): Payer: Self-pay | Admitting: *Deleted

## 2017-04-17 ENCOUNTER — Emergency Department (HOSPITAL_COMMUNITY)
Admission: EM | Admit: 2017-04-17 | Discharge: 2017-04-17 | Disposition: A | Discharge status: Home / Self Care | Payer: Self-pay | Attending: Emergency Medicine | Admitting: Emergency Medicine

## 2017-04-17 ENCOUNTER — Other Ambulatory Visit: Payer: Self-pay

## 2017-04-17 DIAGNOSIS — R45851 Suicidal ideations: Secondary | ICD-10-CM | POA: Insufficient documentation

## 2017-04-17 DIAGNOSIS — Z79899 Other long term (current) drug therapy: Secondary | ICD-10-CM | POA: Insufficient documentation

## 2017-04-17 DIAGNOSIS — F1014 Alcohol abuse with alcohol-induced mood disorder: Secondary | ICD-10-CM | POA: Insufficient documentation

## 2017-04-17 DIAGNOSIS — F17210 Nicotine dependence, cigarettes, uncomplicated: Secondary | ICD-10-CM | POA: Insufficient documentation

## 2017-04-17 DIAGNOSIS — Z590 Homelessness: Secondary | ICD-10-CM | POA: Insufficient documentation

## 2017-04-17 DIAGNOSIS — F209 Schizophrenia, unspecified: Secondary | ICD-10-CM | POA: Insufficient documentation

## 2017-04-17 LAB — ACETAMINOPHEN LEVEL: Acetaminophen (Tylenol), Serum: <10 ug/mL — ABNORMAL LOW (ref 10–30)

## 2017-04-17 LAB — COMPREHENSIVE METABOLIC PANEL
ALBUMIN: 3.5 g/dL (ref 3.5–5.0)
ALT: 23 U/L (ref 17–63)
AST: 28 U/L (ref 15–41)
Alkaline Phosphatase: 57 U/L (ref 38–126)
Anion gap: 8 (ref 5–15)
BUN: 8 mg/dL (ref 6–20)
CHLORIDE: 105 mmol/L (ref 101–111)
CO2: 22 mmol/L (ref 22–32)
CREATININE: 0.71 mg/dL (ref 0.61–1.24)
Calcium: 8.5 mg/dL — ABNORMAL LOW (ref 8.9–10.3)
GFR calc Af Amer: >60 mL/min (ref >60)
GLUCOSE: 95 mg/dL (ref 65–99)
Potassium: 3.8 mmol/L (ref 3.5–5.1)
Sodium: 135 mmol/L (ref 135–145)
Total Bilirubin: 0.2 mg/dL — ABNORMAL LOW (ref 0.3–1.2)
Total Protein: 7.5 g/dL (ref 6.5–8.1)

## 2017-04-17 LAB — CBC
HEMATOCRIT: 37.6 % — AB (ref 39.0–52.0)
HEMOGLOBIN: 13 g/dL (ref 13.0–17.0)
MCH: 32.2 pg (ref 26.0–34.0)
MCHC: 34.6 g/dL (ref 30.0–36.0)
MCV: 93.1 fL (ref 78.0–100.0)
Platelets: 360 10*3/uL (ref 150–400)
RBC: 4.04 MIL/uL — AB (ref 4.22–5.81)
RDW: 13.6 % (ref 11.5–15.5)
WBC: 6.7 10*3/uL (ref 4.0–10.5)

## 2017-04-17 LAB — SALICYLATE LEVEL: Salicylate Lvl: <7 mg/dL (ref 2.8–30.0)

## 2017-04-17 LAB — ETHANOL: ALCOHOL ETHYL (B): 217 mg/dL — AB (ref <10)

## 2017-04-17 MED ORDER — THIAMINE HCL 100 MG/ML IJ SOLN: 100.0000 mg | Freq: Every day | INTRAMUSCULAR | Status: DC

## 2017-04-17 MED ORDER — HYDROCORTISONE 0.5 % EX CREA
TOPICAL_CREAM | Freq: Two times a day (BID) | CUTANEOUS | Status: DC
  Administered 2017-04-17: 14:00:00 via TOPICAL
  Filled 2017-04-17: qty 28.35

## 2017-04-17 MED ORDER — VITAMIN B-1 100 MG PO TABS
100.0000 mg | ORAL_TABLET | Freq: Every day | ORAL | Status: DC
  Administered 2017-04-17: 100 mg via ORAL
  Filled 2017-04-17: qty 1

## 2017-04-17 MED ORDER — LORAZEPAM 2 MG/ML IJ SOLN: 0.0000 mg | Freq: Four times a day (QID) | INTRAMUSCULAR | Status: DC

## 2017-04-17 MED ORDER — HYDROCORTISONE 0.5 % EX CREA: TOPICAL_CREAM | Freq: Two times a day (BID) | CUTANEOUS | 0 refills | Status: DC

## 2017-04-17 MED ORDER — ACETAMINOPHEN 325 MG PO TABS: 650.0000 mg | ORAL_TABLET | ORAL | Status: DC | PRN

## 2017-04-17 MED ORDER — LORAZEPAM 1 MG PO TABS: 0.0000 mg | ORAL_TABLET | Freq: Two times a day (BID) | ORAL | Status: DC

## 2017-04-17 MED ORDER — ONDANSETRON HCL 4 MG PO TABS: 4.0000 mg | ORAL_TABLET | Freq: Three times a day (TID) | ORAL | Status: DC | PRN

## 2017-04-17 MED ORDER — NICOTINE 21 MG/24HR TD PT24
21.0000 mg | MEDICATED_PATCH | Freq: Every day | TRANSDERMAL | Status: DC
  Administered 2017-04-17: 21 mg via TRANSDERMAL
  Filled 2017-04-17: qty 1

## 2017-04-17 MED ORDER — LORAZEPAM 1 MG PO TABS: 0.0000 mg | ORAL_TABLET | Freq: Four times a day (QID) | ORAL | Status: DC

## 2017-04-17 MED ORDER — LORAZEPAM 2 MG/ML IJ SOLN: 0.0000 mg | Freq: Two times a day (BID) | INTRAMUSCULAR | Status: DC
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
## 2017-04-17 NOTE — ED Notes (Signed)
Pt discharged home. Discharged instructions read to pt who verbalized understanding. All belongings returned to pt who signed for same. Denies SI/HI, is not delusional and not responding to internal stimuli. Escorted pt to the ED exit.  Bus pass given to pt. 

## 2017-04-17 NOTE — ED Notes (Signed)
Pt stated "I hear voices and I'm talking to them."  Pt having conversation with no one in the room.

## 2017-04-17 NOTE — [facility] Suicide Risk Assessment (Signed)
62 yo male who presented to the ED with alcohol intoxication and suicidal ideations.  Today, he is clear and coherent with no suicidal ideations. No homicidal ideations, hallucinations, or withdrawal symptoms.  He left Jimmy [name] a few days ago for similar presentations.  Met with Peer Support, resources provided, stable for discharge.  Waylan Boga, PMHNP

## 2017-04-17 NOTE — BH Assessment (Signed)
[facility] Assessment Progress Note  Case discussed with Nira ConnJason Berry, NP who recommends overnight observation and to be reassessed in the AM by psychiatry. EDP Pricilla LovelessGoldston, Scott, MD and pt's nurse Consuella LoseElaine, RN have been advised of the disposition.  Jimmy [name], MSW, LCSW Therapeutic Triage Specialist  [phone]

## 2017-04-17 NOTE — ED Triage Notes (Signed)
Pt brought in by [employer] d/t hearing voices & being suicidal.

## 2017-04-17 NOTE — ED Notes (Signed)
Pt with 2 [employer] magazines under pillow.  Removed & placed in locker.

## 2017-04-17 NOTE — ED Notes (Signed)
Bed: WA29 Expected date:  Expected time:  Means of arrival:  Comments: [employer] 62 yo SI

## 2017-04-17 NOTE — [facility] Suicide Risk Assessment (Signed)
Suicide Risk Assessment  Discharge Assessment   [facility] Discharge Suicide Risk Assessment   Principal Problem: Alcohol abuse with alcohol-induced mood disorder [facility]) Discharge Diagnoses:  Patient Active Problem List   Diagnosis Date Noted  . Alcohol abuse with alcohol-induced mood disorder ([place]) [F10.14] 01/09/2016    Priority: High  . Homicidal ideation [R45.850]   . Severe alcohol use disorder ([place]) [F10.20] 06/24/2015  . Substance induced mood disorder ([place]) [F19.94] 06/24/2015  . Substance or medication-induced bipolar and related disorder with onset during intoxication ([place]) [Y60.600, F19.94] 06/11/2015  . Fracture of bone [T14.8XXA] 06/11/2015  . Polysubstance abuse ([place]) [F19.10] 09/06/2014  . GIB (gastrointestinal bleeding) [K92.2] 09/06/2014  . Homeless [Z59.0] 09/06/2014  . GERD (gastroesophageal reflux disease) [K21.9]   . PUD (peptic ulcer disease) [K27.9]   . Gastroesophageal reflux disease without esophagitis [K21.9]   . Hypokalemia [E87.6]   . S/P alcohol detoxification [Z09] 12/01/2013  . Seizure ([place]) [R56.9] 08/24/2013  . Tobacco use disorder [F17.200] 08/19/2013    Total Time spent with patient: 45 minutes  Musculoskeletal: Strength & Muscle Tone: within normal limits Gait & Station: normal Patient leans: N/A  Psychiatric Specialty Exam:   Blood pressure 119/72, pulse 97, temperature 98.4 F (36.9 C), temperature source Oral, resp. rate 16, height _0  (1.727 m), weight 65.8 kg (145 lb), SpO2 98 %.Body mass index is 22.05 kg/m.  General Appearance: Casual  Eye Contact::  Good  Speech:  Normal Rate  Volume:  Normal  Mood:  Euthymic  Affect:  Congruent  Thought Process:  Coherent and Descriptions of Associations: Intact  Orientation:  Full (Time, Place, and Person)  Thought Content:  WDL and Logical  Suicidal Thoughts:  No  Homicidal Thoughts:  No  Memory:  Immediate;   Good Recent;   Good Remote;   Good  Judgement:  Fair  Insight:  Fair   Psychomotor Activity:  Normal  Concentration:  Good  Recall:  Good  Fund of Knowledge:Fair  Language: Good  Akathisia:  No  Handed:  Right  AIMS (if indicated):     Assets:  Leisure Time Physical Health Resilience  Sleep:     Cognition: WNL  ADL's:  Intact   Mental Status Per Nursing Assessment::   On Admission:    62yo male who presented to the ED with alcohol intoxication and suicidal ideations.  Today, he is clear and coherent with no suicidal ideations. No homicidal ideations, hallucinations, or withdrawal symptoms.  He left [facility] a few days ago for similar presentations.  Met with Peer Support, resources provided, stable for discharge.  Demographic Factors:  Male and Caucasian  Loss Factors: NA  Historical Factors: NA  Risk Reduction Factors:   Sense of responsibility to family and Positive social support  Continued Clinical Symptoms:  None   Cognitive Features That Contribute To Risk:  None    Suicide Risk:  Minimal: No identifiable suicidal ideation.  Patients presenting with no risk factors but with morbid ruminations; may be classified as minimal risk based on the severity of the depressive symptoms    Plan Of Care/Follow-up recommendations:  Activity:  as tolerated Diet:  heart healthy diet  Latesha Chesney, NP 04/17/2017, 1:56 PM

## 2017-04-17 NOTE — Patient Outreach (Signed)
[facility] met with the patient and provided substance use recovery support. Patient is interested in residential substance use treatment at [facility]. [facility] currently does not have a treatment bed available, but the patient plans to follow up with [facility] after discharge. CPSS provided a list of residential and outpatient substance use treatment centers in the area. CPSS also provided an AA meeting list for [place]. CPSS encouraged the patient to contact [facility]PSS at anytime for substance use recovery support.

## 2017-04-17 NOTE — ED Notes (Signed)
TTS assessment in progress. 

## 2017-04-17 NOTE — Patient Outreach (Deleted)
[facility] met with the patient and provided substance use treatment resources. Patient is currently connected with the [place] for outpatient services. CPSS provided the patient with a list of residential treatment centers, outpatient treatment center, and AA meetings in the area. CPSS encouraged the patient to contact CPSS for substance use recovery support after discharge.

## 2017-04-18 ENCOUNTER — Encounter (HOSPITAL_COMMUNITY): Payer: Self-pay

## 2017-04-18 ENCOUNTER — Other Ambulatory Visit: Payer: Self-pay

## 2017-04-18 DIAGNOSIS — F10120 Alcohol abuse with intoxication, uncomplicated: Secondary | ICD-10-CM | POA: Insufficient documentation

## 2017-04-18 DIAGNOSIS — F17210 Nicotine dependence, cigarettes, uncomplicated: Secondary | ICD-10-CM | POA: Insufficient documentation

## 2017-04-18 NOTE — ED Triage Notes (Signed)
[facility] and states he want to get detox from alcohol no pain voiced.

## 2017-04-19 ENCOUNTER — Emergency Department (HOSPITAL_COMMUNITY)
Admission: EM | Admit: 2017-04-19 | Discharge: 2017-04-19 | Disposition: A | Discharge status: Home / Self Care | Payer: Self-pay | Attending: Emergency Medicine | Admitting: Emergency Medicine

## 2017-04-19 DIAGNOSIS — F10920 Alcohol use, unspecified with intoxication, uncomplicated: Secondary | ICD-10-CM

## 2017-04-19 NOTE — ED Notes (Signed)
Pt ambulating well in the room and out of room in the hall without assistance---- pt's gait and balance steady.  Pt requested snacks; stated, "I haven't eaten a thing the whole day".  Pt was given ham sandwich and sprite.
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
## 2017-04-24 ENCOUNTER — Other Ambulatory Visit: Payer: Self-pay

## 2017-04-24 ENCOUNTER — Encounter (HOSPITAL_COMMUNITY): Payer: Self-pay | Admitting: Emergency Medicine

## 2017-04-24 ENCOUNTER — Emergency Department (HOSPITAL_COMMUNITY)
Admission: EM | Admit: 2017-04-24 | Discharge: 2017-04-24 | Disposition: A | Discharge status: Home / Self Care | Payer: Self-pay | Attending: Emergency Medicine | Admitting: Emergency Medicine

## 2017-04-24 DIAGNOSIS — F1010 Alcohol abuse, uncomplicated: Secondary | ICD-10-CM

## 2017-04-24 DIAGNOSIS — R45850 Homicidal ideations: Secondary | ICD-10-CM | POA: Insufficient documentation

## 2017-04-24 DIAGNOSIS — F1014 Alcohol abuse with alcohol-induced mood disorder: Secondary | ICD-10-CM | POA: Insufficient documentation

## 2017-04-24 DIAGNOSIS — F17210 Nicotine dependence, cigarettes, uncomplicated: Secondary | ICD-10-CM | POA: Insufficient documentation

## 2017-04-24 DIAGNOSIS — E876 Hypokalemia: Secondary | ICD-10-CM | POA: Insufficient documentation

## 2017-04-24 DIAGNOSIS — Z590 Homelessness: Secondary | ICD-10-CM | POA: Insufficient documentation

## 2017-04-24 DIAGNOSIS — F1994 Other psychoactive substance use, unspecified with psychoactive substance-induced mood disorder: Secondary | ICD-10-CM | POA: Insufficient documentation

## 2017-04-24 LAB — BASIC METABOLIC PANEL
Anion gap: 9 (ref 5–15)
BUN: 7 mg/dL (ref 6–20)
CHLORIDE: 104 mmol/L (ref 101–111)
CO2: 24 mmol/L (ref 22–32)
CREATININE: 0.59 mg/dL — AB (ref 0.61–1.24)
Calcium: 8.9 mg/dL (ref 8.9–10.3)
GFR calc Af Amer: >60 mL/min (ref >60)
GFR calc non Af Amer: >60 mL/min (ref >60)
Glucose, Bld: 82 mg/dL (ref 65–99)
Potassium: 3.4 mmol/L — ABNORMAL LOW (ref 3.5–5.1)
SODIUM: 137 mmol/L (ref 135–145)

## 2017-04-24 LAB — CBC WITH DIFFERENTIAL/PLATELET
BASOS PCT: 1 %
Basophils Absolute: 0.1 10*3/uL (ref 0.0–0.1)
EOS ABS: 1.3 10*3/uL — AB (ref 0.0–0.7)
EOS PCT: 16 %
HEMATOCRIT: 40.9 % (ref 39.0–52.0)
Hemoglobin: 13.9 g/dL (ref 13.0–17.0)
Lymphocytes Relative: 34 %
Lymphs Abs: 2.8 10*3/uL (ref 0.7–4.0)
MCH: 32 pg (ref 26.0–34.0)
MCHC: 34 g/dL (ref 30.0–36.0)
MCV: 94 fL (ref 78.0–100.0)
MONO ABS: 0.4 10*3/uL (ref 0.1–1.0)
MONOS PCT: 5 %
NEUTROS ABS: 3.6 10*3/uL (ref 1.7–7.7)
Neutrophils Relative %: 44 %
Platelets: 392 10*3/uL (ref 150–400)
RBC: 4.35 MIL/uL (ref 4.22–5.81)
RDW: 13.3 % (ref 11.5–15.5)
WBC: 8.3 10*3/uL (ref 4.0–10.5)

## 2017-04-24 LAB — ETHANOL: Alcohol, Ethyl (B): 142 mg/dL — ABNORMAL HIGH (ref <10)

## 2017-04-24 MED ORDER — POTASSIUM CHLORIDE CRYS ER 20 MEQ PO TBCR
40.0000 meq | EXTENDED_RELEASE_TABLET | Freq: Once | ORAL | Status: AC
Start: 2017-04-24 — End: 2017-04-24
  Administered 2017-04-24: 40 meq via ORAL
  Filled 2017-04-24: qty 2

## 2017-04-24 NOTE — ED Notes (Signed)
Patient provided with a sandwich and orange juice

## 2017-04-24 NOTE — ED Notes (Signed)
Pt sleeping soundly, in NAD

## 2017-04-24 NOTE — ED Notes (Signed)
Pt ambulatory independently to bathroom w/ normal gait. Pt requesting to go home.
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
## 2017-04-24 NOTE — Discharge Instructions (Signed)
1. Medications: usual home medications 2. Treatment: rest, drink plenty of fluids,  3. Follow Up: Please is the resource guide to find a treatment center.

## 2017-04-24 NOTE — ED Triage Notes (Addendum)
[place] [employer] [place] [employer]. States he was released [facility] yesterday and is requesting detox. Pt reported to EMS that "I need to go to the hospital, they give me fluids, a sandwich and a place to stay warm."

## 2017-04-26 ENCOUNTER — Encounter (HOSPITAL_COMMUNITY): Payer: Self-pay | Admitting: Emergency Medicine

## 2017-04-26 ENCOUNTER — Encounter (HOSPITAL_COMMUNITY): Payer: Self-pay

## 2017-04-26 ENCOUNTER — Other Ambulatory Visit: Payer: Self-pay

## 2017-04-26 ENCOUNTER — Emergency Department (HOSPITAL_COMMUNITY)
Admission: EM | Admit: 2017-04-26 | Discharge: 2017-04-26 | Disposition: A | Discharge status: Home / Self Care | Payer: Self-pay | Attending: Emergency Medicine | Admitting: Emergency Medicine

## 2017-04-26 DIAGNOSIS — F1910 Other psychoactive substance abuse, uncomplicated: Secondary | ICD-10-CM | POA: Insufficient documentation

## 2017-04-26 DIAGNOSIS — R45850 Homicidal ideations: Secondary | ICD-10-CM | POA: Insufficient documentation

## 2017-04-26 DIAGNOSIS — Z5321 Procedure and treatment not carried out due to patient leaving prior to being seen by health care provider: Secondary | ICD-10-CM | POA: Insufficient documentation

## 2017-04-26 DIAGNOSIS — F10220 Alcohol dependence with intoxication, uncomplicated: Secondary | ICD-10-CM | POA: Insufficient documentation

## 2017-04-26 DIAGNOSIS — F17210 Nicotine dependence, cigarettes, uncomplicated: Secondary | ICD-10-CM | POA: Insufficient documentation

## 2017-04-26 DIAGNOSIS — R45851 Suicidal ideations: Secondary | ICD-10-CM | POA: Insufficient documentation

## 2017-04-26 DIAGNOSIS — Y908 Blood alcohol level of 240 mg/100 ml or more: Secondary | ICD-10-CM | POA: Insufficient documentation

## 2017-04-26 DIAGNOSIS — R52 Pain, unspecified: Secondary | ICD-10-CM | POA: Insufficient documentation

## 2017-04-26 DIAGNOSIS — F10920 Alcohol use, unspecified with intoxication, uncomplicated: Secondary | ICD-10-CM

## 2017-04-26 LAB — COMPREHENSIVE METABOLIC PANEL
ALBUMIN: 3.8 g/dL (ref 3.5–5.0)
ALT: 15 U/L — AB (ref 17–63)
AST: 22 U/L (ref 15–41)
Alkaline Phosphatase: 64 U/L (ref 38–126)
Anion gap: 10 (ref 5–15)
BUN: 8 mg/dL (ref 6–20)
CO2: 21 mmol/L — AB (ref 22–32)
CREATININE: 0.68 mg/dL (ref 0.61–1.24)
Calcium: 9.2 mg/dL (ref 8.9–10.3)
Chloride: 104 mmol/L (ref 101–111)
GFR calc Af Amer: >60 mL/min (ref >60)
GLUCOSE: 91 mg/dL (ref 65–99)
POTASSIUM: 3.5 mmol/L (ref 3.5–5.1)
SODIUM: 135 mmol/L (ref 135–145)
Total Bilirubin: 0.3 mg/dL (ref 0.3–1.2)
Total Protein: 7.6 g/dL (ref 6.5–8.1)

## 2017-04-26 LAB — RAPID URINE DRUG SCREEN, HOSP PERFORMED
AMPHETAMINES: NOT DETECTED
BENZODIAZEPINES: POSITIVE — AB
Barbiturates: NOT DETECTED
Cocaine: NOT DETECTED
OPIATES: NOT DETECTED
TETRAHYDROCANNABINOL: NOT DETECTED

## 2017-04-26 LAB — CBC
HCT: 36.8 % — ABNORMAL LOW (ref 39.0–52.0)
HEMOGLOBIN: 12.8 g/dL — AB (ref 13.0–17.0)
MCH: 31.9 pg (ref 26.0–34.0)
MCHC: 34.8 g/dL (ref 30.0–36.0)
MCV: 91.8 fL (ref 78.0–100.0)
PLATELETS: 346 10*3/uL (ref 150–400)
RBC: 4.01 MIL/uL — AB (ref 4.22–5.81)
RDW: 13.6 % (ref 11.5–15.5)
WBC: 7 10*3/uL (ref 4.0–10.5)

## 2017-04-26 LAB — SALICYLATE LEVEL: Salicylate Lvl: <7 mg/dL (ref 2.8–30.0)

## 2017-04-26 LAB — ETHANOL: ALCOHOL ETHYL (B): 255 mg/dL — AB (ref <10)

## 2017-04-26 LAB — ACETAMINOPHEN LEVEL: Acetaminophen (Tylenol), Serum: <10 ug/mL — ABNORMAL LOW (ref 10–30)

## 2017-04-26 MED ORDER — THIAMINE HCL 100 MG/ML IJ SOLN
Freq: Once | INTRAVENOUS | Status: AC
  Administered 2017-04-26: 03:00:00 via INTRAVENOUS
  Filled 2017-04-26: qty 1000

## 2017-04-26 NOTE — ED Triage Notes (Signed)
States want detox from alcohol and no other complaints voiced states he hurts all over alert and oriented x 3 no respiratory or acute distress noted.

## 2017-04-26 NOTE — ED Notes (Signed)
4 belonging bags and a yellow backpack retrieved from room 21

## 2017-04-26 NOTE — ED Triage Notes (Signed)
Patient went on a drinking binge and said he wanted to kill hisself to the police but is not saying it to staff. Patient is not compliant with medication.
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
## 2017-04-26 NOTE — ED Notes (Signed)
Pt called for vs, Pt is soundly sleeping in lobby

## 2017-04-27 ENCOUNTER — Emergency Department (HOSPITAL_COMMUNITY)
Admission: EM | Admit: 2017-04-27 | Discharge: 2017-04-27 | Discharge status: Against Medical Advice | Payer: Self-pay | Attending: Emergency Medicine | Admitting: Emergency Medicine

## 2017-04-27 NOTE — ED Notes (Addendum)
Pt called for room. No response from lobby and pt not visualized in waiting room.

## 2017-04-27 NOTE — ED Notes (Signed)
Pt called for room. No response from lobby 

## 2017-04-27 NOTE — ED Notes (Signed)
Pt called for recheck, no response from lobby

## 2017-05-01 ENCOUNTER — Emergency Department (HOSPITAL_COMMUNITY): Admission: EM | Admit: 2017-05-01 | Discharge: 2017-05-02 | Discharge status: Against Medical Advice | Payer: Self-pay

## 2017-05-01 NOTE — ED Notes (Signed)
Bed: WTR6 Expected date:  Expected time:  Means of arrival:  Comments: 

## 2017-05-02 ENCOUNTER — Ambulatory Visit (HOSPITAL_COMMUNITY)
Admission: RE | Admit: 2017-05-02 | Discharge: 2017-05-02 | Disposition: A | Discharge status: Home / Self Care | Payer: Self-pay | Attending: Psychiatry | Admitting: Psychiatry

## 2017-05-02 ENCOUNTER — Emergency Department (HOSPITAL_COMMUNITY)
Admission: EM | Admit: 2017-05-02 | Discharge: 2017-05-02 | Disposition: A | Discharge status: Home / Self Care | Payer: Self-pay | Attending: Emergency Medicine | Admitting: Emergency Medicine

## 2017-05-02 ENCOUNTER — Encounter (HOSPITAL_COMMUNITY): Payer: Self-pay | Admitting: *Deleted

## 2017-05-02 DIAGNOSIS — F1020 Alcohol dependence, uncomplicated: Secondary | ICD-10-CM | POA: Insufficient documentation

## 2017-05-02 DIAGNOSIS — F17210 Nicotine dependence, cigarettes, uncomplicated: Secondary | ICD-10-CM | POA: Insufficient documentation

## 2017-05-02 DIAGNOSIS — F319 Bipolar disorder, unspecified: Secondary | ICD-10-CM | POA: Insufficient documentation

## 2017-05-02 DIAGNOSIS — K219 Gastro-esophageal reflux disease without esophagitis: Secondary | ICD-10-CM | POA: Insufficient documentation

## 2017-05-02 DIAGNOSIS — F209 Schizophrenia, unspecified: Secondary | ICD-10-CM | POA: Insufficient documentation

## 2017-05-02 DIAGNOSIS — Z5900 Homelessness unspecified: Secondary | ICD-10-CM

## 2017-05-02 DIAGNOSIS — R45851 Suicidal ideations: Secondary | ICD-10-CM | POA: Insufficient documentation

## 2017-05-02 DIAGNOSIS — F1010 Alcohol abuse, uncomplicated: Secondary | ICD-10-CM | POA: Insufficient documentation

## 2017-05-02 LAB — COMPREHENSIVE METABOLIC PANEL
ALT: 17 U/L (ref 17–63)
AST: 30 U/L (ref 15–41)
Albumin: 4 g/dL (ref 3.5–5.0)
Alkaline Phosphatase: 76 U/L (ref 38–126)
Anion gap: 7 (ref 5–15)
BILIRUBIN TOTAL: 0.2 mg/dL — AB (ref 0.3–1.2)
BUN: 5 mg/dL — AB (ref 6–20)
CO2: 24 mmol/L (ref 22–32)
CREATININE: 0.62 mg/dL (ref 0.61–1.24)
Calcium: 8.6 mg/dL — ABNORMAL LOW (ref 8.9–10.3)
Chloride: 102 mmol/L (ref 101–111)
Glucose, Bld: 81 mg/dL (ref 65–99)
Potassium: 3.8 mmol/L (ref 3.5–5.1)
Sodium: 133 mmol/L — ABNORMAL LOW (ref 135–145)
TOTAL PROTEIN: 8 g/dL (ref 6.5–8.1)

## 2017-05-02 LAB — CBC
HCT: 40.1 % (ref 39.0–52.0)
Hemoglobin: 13.9 g/dL (ref 13.0–17.0)
MCH: 32 pg (ref 26.0–34.0)
MCHC: 34.7 g/dL (ref 30.0–36.0)
MCV: 92.2 fL (ref 78.0–100.0)
PLATELETS: 306 10*3/uL (ref 150–400)
RBC: 4.35 MIL/uL (ref 4.22–5.81)
RDW: 14 % (ref 11.5–15.5)
WBC: 5.7 10*3/uL (ref 4.0–10.5)

## 2017-05-02 LAB — ETHANOL: ALCOHOL ETHYL (B): 131 mg/dL — AB (ref <10)

## 2017-05-02 LAB — RAPID URINE DRUG SCREEN, HOSP PERFORMED
Amphetamines: NOT DETECTED
Barbiturates: NOT DETECTED
Benzodiazepines: POSITIVE — AB
Cocaine: NOT DETECTED
Opiates: NOT DETECTED
Tetrahydrocannabinol: NOT DETECTED

## 2017-05-02 LAB — ACETAMINOPHEN LEVEL: Acetaminophen (Tylenol), Serum: <10 ug/mL — ABNORMAL LOW (ref 10–30)

## 2017-05-02 LAB — SALICYLATE LEVEL: Salicylate Lvl: <7 mg/dL (ref 2.8–30.0)

## 2017-05-02 NOTE — ED Triage Notes (Signed)
Pt ambulatory from lobby to triage room states that he would take a piece of glass and would cut himself to die and "im going to kill somebody"

## 2017-05-02 NOTE — ED Triage Notes (Signed)
Pt has been called in lobby x2 over 30 minute period without response.

## 2017-05-02 NOTE — H&P (Signed)
Behavioral Health Medical Screening Exam  Jimmy BrakemanJack L [name] is an 62 y.o. male who [facility]. Patient denies current suicide or homicide ideations. [facility] under the impression that he can be assisted with living arrangement as was advised in the hospital.   Total Time spent with patient: 30 minutes  Psychiatric Specialty Exam: Physical Exam  Vitals reviewed. Constitutional: He is oriented to person, place, and time. He appears well-developed and well-nourished.  HENT:  Head: Normocephalic and atraumatic.  Eyes: Pupils are equal, round, and reactive to light.  Neck: Normal range of motion.  Cardiovascular: Normal rate, regular rhythm and normal heart sounds.  Respiratory: Effort normal and breath sounds normal.  GI: Soft. Bowel sounds are normal.  Musculoskeletal: Normal range of motion.  Neurological: He is alert and oriented to person, place, and time.  Skin: Skin is warm and dry.    Review of Systems  Psychiatric/Behavioral: Positive for depression, hallucinations and substance abuse. Negative for memory loss and suicidal ideas. The patient is nervous/anxious. The patient does not have insomnia.     Blood pressure (!) 147/69, pulse (!) 109, temperature 98.2 F (36.8 C), temperature source Oral, resp. rate 18, SpO2 98 %.There is no height or weight on file to calculate BMI.  General Appearance: Casual and Disheveled  Eye Contact:  Good  Speech:  Clear and Coherent and Normal Rate  Volume:  Normal  Mood:  Anxious, Depressed and Hopeless  Affect:  Blunt  Thought Process:  Coherent and Goal Directed  Orientation:  Full (Time, Place, and Person)  Thought Content:  WDL and Logical  Suicidal Thoughts:  No  Homicidal Thoughts:  No  Memory:  Immediate;   Good Recent;   Fair Remote;   Fair  Judgement:  Intact  Insight:  Present  Psychomotor Activity:  Normal  Concentration: Concentration: Fair and  Attention Span: Fair  Recall:  Good  Fund of Knowledge:Good  Language: Good  Akathisia:  Negative  Handed:  Right  AIMS (if indicated):     Assets:  Communication Skills Desire for Improvement Financial Resources/Insurance Social Support  Sleep:       Musculoskeletal: Strength & Muscle Tone: within normal limits Gait & Station: normal Patient leans: N/A  Blood pressure (!) 147/69, pulse (!) 109, temperature 98.2 F (36.8 C), temperature source Oral, resp. rate 18, SpO2 98 %.  Recommendations:  Based on my evaluation the patient does not appear to have an emergency medical condition.  [name]. [facility].  Delila PereyraJustina A Ligia Duguay, NP 05/02/2017, 10:02 AM
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
## 2017-05-13 ENCOUNTER — Encounter (HOSPITAL_BASED_OUTPATIENT_CLINIC_OR_DEPARTMENT_OTHER): Payer: Self-pay

## 2017-05-13 ENCOUNTER — Emergency Department (HOSPITAL_BASED_OUTPATIENT_CLINIC_OR_DEPARTMENT_OTHER)
Admission: EM | Admit: 2017-05-13 | Discharge: 2017-05-13 | Disposition: A | Discharge status: Home / Self Care | Payer: Self-pay | Attending: Emergency Medicine | Admitting: Emergency Medicine

## 2017-05-13 ENCOUNTER — Other Ambulatory Visit: Payer: Self-pay

## 2017-05-13 DIAGNOSIS — L259 Unspecified contact dermatitis, unspecified cause: Secondary | ICD-10-CM | POA: Insufficient documentation

## 2017-05-13 DIAGNOSIS — L309 Dermatitis, unspecified: Secondary | ICD-10-CM

## 2017-05-13 DIAGNOSIS — F17210 Nicotine dependence, cigarettes, uncomplicated: Secondary | ICD-10-CM | POA: Insufficient documentation

## 2017-05-13 MED ORDER — TRIAMCINOLONE ACETONIDE 0.1 % EX OINT
TOPICAL_OINTMENT | Freq: Two times a day (BID) | CUTANEOUS | Status: DC
  Filled 2017-05-13: qty 15

## 2017-05-13 MED ORDER — TRIAMCINOLONE ACETONIDE 0.1 % EX CREA: 1.0000 "application " | TOPICAL_CREAM | Freq: Two times a day (BID) | CUTANEOUS | 0 refills | Status: DC

## 2017-05-13 NOTE — ED Triage Notes (Signed)
[facility] report-pt with rash to back of legs- VS 150/94 P 99 O2 sat 98-pt ambulatory states rash to back of legs and to scrotum x 1 month-NAD-steady gait-smells of ETOH-admits to daily intake

## 2017-05-13 NOTE — ED Notes (Signed)
Cab call for pt to take to [address]  [place]  Address given to us pt pt

## 2017-05-13 NOTE — Discharge Instructions (Signed)
Your rash is likely eczema.  Apply triamcinolone ointment to affected area twice daily for the next 2 weeks and follow up with dermatologist for further care.
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
## 2017-05-21 ENCOUNTER — Encounter (HOSPITAL_COMMUNITY): Payer: Self-pay | Admitting: *Deleted

## 2017-05-21 DIAGNOSIS — F319 Bipolar disorder, unspecified: Secondary | ICD-10-CM | POA: Insufficient documentation

## 2017-05-21 DIAGNOSIS — F17210 Nicotine dependence, cigarettes, uncomplicated: Secondary | ICD-10-CM | POA: Insufficient documentation

## 2017-05-21 DIAGNOSIS — L2082 Flexural eczema: Secondary | ICD-10-CM | POA: Insufficient documentation

## 2017-05-21 NOTE — ED Triage Notes (Signed)
Pt arrives via [facility] (pt is homeless). Per report, pt has rash on front and back of his knees, evaluated for the same recently, dx with eczema. Pt is out of his cream and needs more as the rash is not improving. +ETOH tonight.

## 2017-05-22 ENCOUNTER — Emergency Department (HOSPITAL_COMMUNITY)
Admission: EM | Admit: 2017-05-22 | Discharge: 2017-05-22 | Disposition: A | Discharge status: Home / Self Care | Payer: Self-pay | Attending: Emergency Medicine | Admitting: Emergency Medicine

## 2017-05-22 ENCOUNTER — Emergency Department (HOSPITAL_COMMUNITY)
Admission: EM | Admit: 2017-05-22 | Discharge: 2017-05-23 | Disposition: A | Discharge status: Home / Self Care | Payer: Self-pay | Attending: Emergency Medicine | Admitting: Emergency Medicine

## 2017-05-22 ENCOUNTER — Encounter (HOSPITAL_COMMUNITY): Payer: Self-pay

## 2017-05-22 ENCOUNTER — Other Ambulatory Visit: Payer: Self-pay

## 2017-05-22 DIAGNOSIS — R45851 Suicidal ideations: Secondary | ICD-10-CM | POA: Insufficient documentation

## 2017-05-22 DIAGNOSIS — F250 Schizoaffective disorder, bipolar type: Secondary | ICD-10-CM | POA: Insufficient documentation

## 2017-05-22 DIAGNOSIS — L2082 Flexural eczema: Secondary | ICD-10-CM

## 2017-05-22 DIAGNOSIS — F17210 Nicotine dependence, cigarettes, uncomplicated: Secondary | ICD-10-CM | POA: Insufficient documentation

## 2017-05-22 DIAGNOSIS — F1014 Alcohol abuse with alcohol-induced mood disorder: Secondary | ICD-10-CM | POA: Diagnosis present

## 2017-05-22 LAB — COMPREHENSIVE METABOLIC PANEL
ALT: 19 U/L (ref 17–63)
AST: 37 U/L (ref 15–41)
Albumin: 3.9 g/dL (ref 3.5–5.0)
Alkaline Phosphatase: 74 U/L (ref 38–126)
Anion gap: 11 (ref 5–15)
BILIRUBIN TOTAL: 0.9 mg/dL (ref 0.3–1.2)
BUN: 6 mg/dL (ref 6–20)
CALCIUM: 8.9 mg/dL (ref 8.9–10.3)
CO2: 22 mmol/L (ref 22–32)
CREATININE: 0.76 mg/dL (ref 0.61–1.24)
Chloride: 94 mmol/L — ABNORMAL LOW (ref 101–111)
GFR calc Af Amer: >60 mL/min (ref >60)
Glucose, Bld: 91 mg/dL (ref 65–99)
Potassium: 3.5 mmol/L (ref 3.5–5.1)
Sodium: 127 mmol/L — ABNORMAL LOW (ref 135–145)
TOTAL PROTEIN: 7.8 g/dL (ref 6.5–8.1)

## 2017-05-22 LAB — RAPID URINE DRUG SCREEN, HOSP PERFORMED
AMPHETAMINES: NOT DETECTED
Barbiturates: NOT DETECTED
Benzodiazepines: NOT DETECTED
Cocaine: NOT DETECTED
Opiates: NOT DETECTED
Tetrahydrocannabinol: NOT DETECTED

## 2017-05-22 LAB — CBC
HCT: 37.5 % — ABNORMAL LOW (ref 39.0–52.0)
Hemoglobin: 13.3 g/dL (ref 13.0–17.0)
MCH: 33.3 pg (ref 26.0–34.0)
MCHC: 35.5 g/dL (ref 30.0–36.0)
MCV: 94 fL (ref 78.0–100.0)
PLATELETS: 276 10*3/uL (ref 150–400)
RBC: 3.99 MIL/uL — ABNORMAL LOW (ref 4.22–5.81)
RDW: 13.8 % (ref 11.5–15.5)
WBC: 7.3 10*3/uL (ref 4.0–10.5)

## 2017-05-22 LAB — SALICYLATE LEVEL: Salicylate Lvl: <7 mg/dL (ref 2.8–30.0)

## 2017-05-22 LAB — ETHANOL

## 2017-05-22 LAB — ACETAMINOPHEN LEVEL: Acetaminophen (Tylenol), Serum: <10 ug/mL — ABNORMAL LOW (ref 10–30)

## 2017-05-22 MED ORDER — VITAMIN B-1 100 MG PO TABS
100.0000 mg | ORAL_TABLET | Freq: Every day | ORAL | Status: DC
  Administered 2017-05-22 – 2017-05-23 (×2): 100 mg via ORAL
  Filled 2017-05-22 (×2): qty 1

## 2017-05-22 MED ORDER — TRIAMCINOLONE ACETONIDE 0.1 % EX CREA
1.0000 "application " | TOPICAL_CREAM | Freq: Two times a day (BID) | CUTANEOUS | Status: DC
  Administered 2017-05-22 – 2017-05-23 (×2): 1 via TOPICAL
  Filled 2017-05-22 (×2): qty 15

## 2017-05-22 MED ORDER — TRIAMCINOLONE ACETONIDE 0.1 % EX CREA: 1.0000 "application " | TOPICAL_CREAM | Freq: Two times a day (BID) | CUTANEOUS | 0 refills | Status: DC

## 2017-05-22 MED ORDER — LORAZEPAM 2 MG/ML IJ SOLN: 0.0000 mg | Freq: Two times a day (BID) | INTRAMUSCULAR | Status: DC

## 2017-05-22 MED ORDER — LORAZEPAM 2 MG/ML IJ SOLN: 0.0000 mg | Freq: Four times a day (QID) | INTRAMUSCULAR | Status: DC

## 2017-05-22 MED ORDER — LORAZEPAM 1 MG PO TABS: 0.0000 mg | ORAL_TABLET | Freq: Two times a day (BID) | ORAL | Status: DC

## 2017-05-22 MED ORDER — THIAMINE HCL 100 MG/ML IJ SOLN: 100.0000 mg | Freq: Every day | INTRAMUSCULAR | Status: DC

## 2017-05-22 MED ORDER — NICOTINE 21 MG/24HR TD PT24
21.0000 mg | MEDICATED_PATCH | Freq: Every day | TRANSDERMAL | Status: DC
  Administered 2017-05-22: 21 mg via TRANSDERMAL
  Filled 2017-05-22: qty 1

## 2017-05-22 MED ORDER — LORAZEPAM 1 MG PO TABS
0.0000 mg | ORAL_TABLET | Freq: Four times a day (QID) | ORAL | Status: DC
  Administered 2017-05-22: 2 mg via ORAL
  Filled 2017-05-22: qty 2

## 2017-05-22 NOTE — ED Triage Notes (Signed)
Pt states that he's feeling SI tonight
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
## 2017-05-22 NOTE — ED Notes (Signed)
Urine cup given and patient informed that urine specimen needed.

## 2017-05-22 NOTE — ED Notes (Signed)
[id number] Expected date:  Expected time:  Means of arrival:  Comments: Blanca FriendJack Sappen[place]
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
## 2017-05-22 NOTE — ED Notes (Signed)
Pt left without discharge paperwork/ prescription
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
## 2017-05-22 NOTE — ED Triage Notes (Signed)
Pt reports bilateral leg rash, now also on his feet. Pt reports he did not get the prescription cream filled because he could not afford it.

## 2017-05-22 NOTE — Discharge Instructions (Signed)
Use the coupon provided to fill the prescription for triamcinolone cream. Apply a thin layer of the cream to your rash 2 times per day.  You can also take benadryl every 6 hours as needed for itching. An alternative to the triamcinolone cream when you run out is an ointment such as vaseline.

## 2017-05-22 NOTE — ED Notes (Signed)
Bed: [facility] Expected date:  Expected time:  Means of arrival:  Comments: 

## 2017-05-22 NOTE — Progress Notes (Signed)
Nira ConnJason Berry, NP recommends overnight observation and to be reassessed in the AM by psychiatry. EDP Ward, Chase PicketJaime Pilcher, PA-C and pt's nurse Rashell, RN have been advised of the disposition.   Princess BruinsAquicha Haadi Santellan, MSW, LCSW Therapeutic Triage Specialist  [phone]

## 2017-05-22 NOTE — ED Notes (Signed)
Patient arrives to treatment area 40 via ambulatory with a steady gait. Patient reports SI no plan and denies HI/AVH. Plan of care discussed. Encouragement and support provided and safety maintain. Q 15 min safety checks in place and video monitoring.

## 2017-05-23 ENCOUNTER — Emergency Department (HOSPITAL_COMMUNITY)
Admission: EM | Admit: 2017-05-23 | Discharge: 2017-05-24 | Disposition: A | Discharge status: Home / Self Care | Payer: Self-pay | Attending: Emergency Medicine | Admitting: Emergency Medicine

## 2017-05-23 ENCOUNTER — Encounter (HOSPITAL_COMMUNITY): Payer: Self-pay | Admitting: *Deleted

## 2017-05-23 ENCOUNTER — Other Ambulatory Visit: Payer: Self-pay

## 2017-05-23 DIAGNOSIS — Z5900 Homelessness unspecified: Secondary | ICD-10-CM

## 2017-05-23 DIAGNOSIS — Z811 Family history of alcohol abuse and dependence: Secondary | ICD-10-CM

## 2017-05-23 DIAGNOSIS — F17210 Nicotine dependence, cigarettes, uncomplicated: Secondary | ICD-10-CM | POA: Insufficient documentation

## 2017-05-23 DIAGNOSIS — Z79899 Other long term (current) drug therapy: Secondary | ICD-10-CM | POA: Insufficient documentation

## 2017-05-23 DIAGNOSIS — M79672 Pain in left foot: Secondary | ICD-10-CM | POA: Insufficient documentation

## 2017-05-23 DIAGNOSIS — M79671 Pain in right foot: Secondary | ICD-10-CM | POA: Insufficient documentation

## 2017-05-23 DIAGNOSIS — F1014 Alcohol abuse with alcohol-induced mood disorder: Secondary | ICD-10-CM

## 2017-05-23 LAB — BASIC METABOLIC PANEL
Anion gap: 10 (ref 5–15)
BUN: 7 mg/dL (ref 6–20)
CO2: 25 mmol/L (ref 22–32)
CREATININE: 0.95 mg/dL (ref 0.61–1.24)
Calcium: 9.1 mg/dL (ref 8.9–10.3)
Chloride: 100 mmol/L — ABNORMAL LOW (ref 101–111)
GFR calc Af Amer: >60 mL/min (ref >60)
GFR calc non Af Amer: >60 mL/min (ref >60)
GLUCOSE: 90 mg/dL (ref 65–99)
Potassium: 3.7 mmol/L (ref 3.5–5.1)
SODIUM: 135 mmol/L (ref 135–145)

## 2017-05-23 NOTE — ED Notes (Signed)
Pt taking a shower 
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
## 2017-05-23 NOTE — [facility] Suicide Risk Assessment (Signed)
Suicide Risk Assessment  Discharge Assessment   [facility]   Principal Problem: Alcohol abuse with alcohol-induced mood disorder [facility](HCC) Discharge Diagnoses:  Patient Active Problem List   Diagnosis Date Noted  . Alcohol abuse with alcohol-induced mood disorder (HCC) [F10.14] 01/09/2016    Priority: High  . Polysubstance abuse (HCC) [F19.10] 09/06/2014    Priority: High  . Homicidal ideation [R45.850]   . Severe alcohol use disorder (HCC) [F10.20] 06/24/2015  . Substance induced mood disorder (HCC) [F19.94] 06/24/2015  . Substance or medication-induced bipolar and related disorder with onset during intoxication (HCC) [J47.829[F19.929, F19.94] 06/11/2015  . Fracture of bone [T14.8XXA] 06/11/2015  . GIB (gastrointestinal bleeding) [K92.2] 09/06/2014  . Homeless [Z59.0] 09/06/2014  . GERD (gastroesophageal reflux disease) [K21.9]   . PUD (peptic ulcer disease) [K27.9]   . Gastroesophageal reflux disease without esophagitis [K21.9]   . Hypokalemia [E87.6]   . S/P alcohol detoxification [Z09] 12/01/2013  . Seizure (HCC) [R56.9] 08/24/2013  . Tobacco use disorder [F17.200] 08/19/2013    Total Time spent with patient: 30 minutes  Musculoskeletal: Strength & Muscle Tone: within normal limits Gait & Station: normal Patient leans: N/A  Psychiatric Specialty Exam: Physical Exam  Constitutional: He is oriented to person, place, and time. He appears well-developed and well-nourished.  HENT:  Head: Normocephalic.  Neurological: He is alert and oriented to person, place, and time. He has normal strength.  Skin: Skin is warm and dry.  Psychiatric: He has a normal mood and affect. His speech is normal and behavior is normal. Judgment and thought content normal. Cognition and memory are normal.   ROS Blood pressure 121/82, pulse 95, temperature 98.3 F (36.8 C), temperature source Oral, resp. rate 20, SpO2 99 %.There is no height or weight on file to calculate  BMI. General Appearance: Casual Eye Contact:  Good Speech:  Clear and Coherent Volume:  Normal Mood:  Euthymic Affect:  Appropriate Thought Process:  Coherent Orientation:  Full (Time, Place, and Person) Thought Content:  Logical Suicidal Thoughts:  No Homicidal Thoughts:  No Memory:  Immediate;   Good Recent;   Good Remote;   Good Judgement:  Good Insight:  Good Psychomotor Activity:  Normal Concentration:  Concentration: Good and Attention Span: Good Recall:  Good Fund of Knowledge:  Good Language:  Good Akathisia:  No Handed:  Right AIMS (if indicated):    Assets:  Communication Skills Desire for Improvement Physical Health Resilience ADL's:  Intact Cognition:  WNL   Mental Status Per Nursing Assessment::   On Admission:    suicidal thoughts while intoxicated Demographic Factors:  Male, Caucasian, Low socioeconomic status and Unemployed  Loss Factors: Financial problems/change in socioeconomic status  Historical Factors: Impulsivity  Risk Reduction Factors:   Sense of responsibility to family  Continued Clinical Symptoms:  Alcohol/Substance Abuse/Dependencies  Cognitive Features That Contribute To Risk:  Closed-mindedness    Suicide Risk:  Minimal: No identifiable suicidal ideation.  Patients presenting with no risk factors but with morbid ruminations; may be classified as minimal risk based on the severity of the depressive symptoms    Plan Of Care/Follow-up recommendations:  Activity:  as tolerated Diet:  Heart Healthy  Laveda AbbeLaurie Britton Myleka Moncure, NP 05/23/2017, 11:40 AM
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
## 2017-05-23 NOTE — ED Notes (Signed)
Pt d/c home per MD order. Discharge summary reviewed with pt. Pt verbalizes understanding. Pt denies SI/HI/AVH. Pt signed for personal property and property returned. Donated clothes from Chaplain provided, per pt request. Pt signed e-signature. Ambulatory off unit.
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
## 2017-05-24 NOTE — Discharge Instructions (Signed)
As discussed, your evaluation today has been largely reassuring.  But, it is important that you monitor your condition carefully, and do not hesitate to return to the ED if you develop new, or concerning changes in your condition. ? ?Otherwise, please follow-up with your physician for appropriate ongoing care. ? ?
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
## 2017-07-11 ENCOUNTER — Other Ambulatory Visit: Payer: Self-pay

## 2017-07-11 ENCOUNTER — Encounter (HOSPITAL_COMMUNITY): Payer: Self-pay | Admitting: *Deleted

## 2017-07-11 ENCOUNTER — Emergency Department (HOSPITAL_COMMUNITY)
Admission: EM | Admit: 2017-07-11 | Discharge: 2017-07-11 | Disposition: A | Discharge status: Home / Self Care | Payer: Self-pay | Attending: Emergency Medicine | Admitting: Emergency Medicine

## 2017-07-11 DIAGNOSIS — R112 Nausea with vomiting, unspecified: Secondary | ICD-10-CM | POA: Insufficient documentation

## 2017-07-11 DIAGNOSIS — F1010 Alcohol abuse, uncomplicated: Secondary | ICD-10-CM | POA: Insufficient documentation

## 2017-07-11 DIAGNOSIS — R42 Dizziness and giddiness: Secondary | ICD-10-CM | POA: Insufficient documentation

## 2017-07-11 DIAGNOSIS — R195 Other fecal abnormalities: Secondary | ICD-10-CM | POA: Insufficient documentation

## 2017-07-11 DIAGNOSIS — F17210 Nicotine dependence, cigarettes, uncomplicated: Secondary | ICD-10-CM | POA: Insufficient documentation

## 2017-07-11 DIAGNOSIS — F1410 Cocaine abuse, uncomplicated: Secondary | ICD-10-CM | POA: Insufficient documentation

## 2017-07-11 DIAGNOSIS — E876 Hypokalemia: Secondary | ICD-10-CM | POA: Insufficient documentation

## 2017-07-11 DIAGNOSIS — R197 Diarrhea, unspecified: Secondary | ICD-10-CM | POA: Insufficient documentation

## 2017-07-11 LAB — URINALYSIS, ROUTINE W REFLEX MICROSCOPIC
Bilirubin Urine: NEGATIVE
Glucose, UA: NEGATIVE mg/dL
HGB URINE DIPSTICK: NEGATIVE
KETONES UR: NEGATIVE mg/dL
Leukocytes, UA: NEGATIVE
NITRITE: NEGATIVE
PH: 7 (ref 5.0–8.0)
PROTEIN: NEGATIVE mg/dL
SPECIFIC GRAVITY, URINE: 1.01 (ref 1.005–1.030)
Squamous Epithelial / LPF: NONE SEEN

## 2017-07-11 LAB — COMPREHENSIVE METABOLIC PANEL
ALBUMIN: 4.2 g/dL (ref 3.5–5.0)
ALT: 29 U/L (ref 17–63)
ANION GAP: 14 (ref 5–15)
AST: 40 U/L (ref 15–41)
Alkaline Phosphatase: 71 U/L (ref 38–126)
BUN: 14 mg/dL (ref 6–20)
CO2: 30 mmol/L (ref 22–32)
Calcium: 10.5 mg/dL — ABNORMAL HIGH (ref 8.9–10.3)
Chloride: 83 mmol/L — ABNORMAL LOW (ref 101–111)
Creatinine, Ser: 0.79 mg/dL (ref 0.61–1.24)
GFR calc Af Amer: >60 mL/min (ref >60)
GFR calc non Af Amer: >60 mL/min (ref >60)
GLUCOSE: 108 mg/dL — AB (ref 65–99)
POTASSIUM: 2.8 mmol/L — AB (ref 3.5–5.1)
SODIUM: 127 mmol/L — AB (ref 135–145)
Total Bilirubin: 0.6 mg/dL (ref 0.3–1.2)
Total Protein: 8.3 g/dL — ABNORMAL HIGH (ref 6.5–8.1)

## 2017-07-11 LAB — CBC
HCT: 33 % — ABNORMAL LOW (ref 39.0–52.0)
HEMOGLOBIN: 11.8 g/dL — AB (ref 13.0–17.0)
MCH: 33.6 pg (ref 26.0–34.0)
MCHC: 35.8 g/dL (ref 30.0–36.0)
MCV: 94 fL (ref 78.0–100.0)
Platelets: 306 10*3/uL (ref 150–400)
RBC: 3.51 MIL/uL — ABNORMAL LOW (ref 4.22–5.81)
RDW: 12.2 % (ref 11.5–15.5)
WBC: 5.5 10*3/uL (ref 4.0–10.5)

## 2017-07-11 LAB — RAPID URINE DRUG SCREEN, HOSP PERFORMED
Amphetamines: NOT DETECTED
BENZODIAZEPINES: NOT DETECTED
Barbiturates: NOT DETECTED
COCAINE: NOT DETECTED
Opiates: NOT DETECTED
TETRAHYDROCANNABINOL: NOT DETECTED

## 2017-07-11 LAB — POC OCCULT BLOOD, ED: Fecal Occult Bld: POSITIVE — AB

## 2017-07-11 LAB — LIPASE, BLOOD: Lipase: 33 U/L (ref 11–51)

## 2017-07-11 MED ORDER — POTASSIUM CHLORIDE 10 MEQ/100ML IV SOLN
10.0000 meq | Freq: Once | INTRAVENOUS | Status: AC
Start: 2017-07-11 — End: 2017-07-11
  Administered 2017-07-11: 10 meq via INTRAVENOUS
  Filled 2017-07-11: qty 100

## 2017-07-11 MED ORDER — LORAZEPAM 2 MG/ML IJ SOLN: 0.0000 mg | Freq: Four times a day (QID) | INTRAMUSCULAR | Status: DC

## 2017-07-11 MED ORDER — POTASSIUM CHLORIDE CRYS ER 20 MEQ PO TBCR
40.0000 meq | EXTENDED_RELEASE_TABLET | Freq: Once | ORAL | Status: AC
  Administered 2017-07-11: 40 meq via ORAL
  Filled 2017-07-11: qty 2

## 2017-07-11 MED ORDER — LORAZEPAM 2 MG/ML IJ SOLN: 0.0000 mg | Freq: Two times a day (BID) | INTRAMUSCULAR | Status: DC

## 2017-07-11 MED ORDER — LORAZEPAM 1 MG PO TABS: 0.0000 mg | ORAL_TABLET | Freq: Two times a day (BID) | ORAL | Status: DC

## 2017-07-11 MED ORDER — LORAZEPAM 1 MG PO TABS
0.0000 mg | ORAL_TABLET | Freq: Four times a day (QID) | ORAL | Status: DC
  Administered 2017-07-11: 1 mg via ORAL
  Filled 2017-07-11: qty 1

## 2017-07-11 MED ORDER — FAMOTIDINE 20 MG PO TABS: 20.0000 mg | ORAL_TABLET | Freq: Two times a day (BID) | ORAL | 0 refills | Status: DC

## 2017-07-11 MED ORDER — VITAMIN B-1 100 MG PO TABS
100.0000 mg | ORAL_TABLET | Freq: Every day | ORAL | Status: DC
  Administered 2017-07-11: 100 mg via ORAL
  Filled 2017-07-11: qty 1

## 2017-07-11 MED ORDER — SODIUM CHLORIDE 0.9 % IV BOLUS
500.0000 mL | Freq: Once | INTRAVENOUS | Status: AC
  Administered 2017-07-11: 500 mL via INTRAVENOUS

## 2017-07-11 MED ORDER — THIAMINE HCL 100 MG/ML IJ SOLN: 100.0000 mg | Freq: Every day | INTRAMUSCULAR | Status: DC

## 2017-07-11 MED ORDER — FAMOTIDINE IN NACL 20-0.9 MG/50ML-% IV SOLN
20.0000 mg | Freq: Once | INTRAVENOUS | Status: AC
  Administered 2017-07-11: 20 mg via INTRAVENOUS
  Filled 2017-07-11: qty 50

## 2017-07-11 NOTE — ED Notes (Signed)
Care management to contact pt. Later on not here.

## 2017-07-11 NOTE — ED Notes (Signed)
Declined signature

## 2017-07-11 NOTE — ED Notes (Signed)
Pt aware that urine sample is needed.  
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
## 2017-07-11 NOTE — ED Triage Notes (Signed)
EMS states pt reports nausea and vomiting for 3 days.

## 2017-07-11 NOTE — Discharge Instructions (Addendum)
You were given a prescription for Pepcid.  You need to take 20 mg two times daily.  You are also given a referral for a gastroenterologist.  He will need to call the office and make an appointment for follow-up.  You will need to return to the emergency department immediately if you have any persistent abdominal pain, vomiting, fevers, diarrhea, blood in your stool or vomit, chest pain, shortness of breath, or any new or worsening symptoms.

## 2017-07-11 NOTE — ED Notes (Signed)
Instructed on needing u/a 

## 2017-07-12 ENCOUNTER — Encounter (HOSPITAL_COMMUNITY): Payer: Self-pay | Admitting: Emergency Medicine

## 2017-07-12 ENCOUNTER — Other Ambulatory Visit: Payer: Self-pay

## 2017-07-12 ENCOUNTER — Emergency Department (HOSPITAL_COMMUNITY)
Admission: EM | Admit: 2017-07-12 | Discharge: 2017-07-12 | Disposition: A | Discharge status: Home / Self Care | Payer: Self-pay | Attending: Emergency Medicine | Admitting: Emergency Medicine

## 2017-07-12 DIAGNOSIS — L2082 Flexural eczema: Secondary | ICD-10-CM

## 2017-07-12 DIAGNOSIS — F17210 Nicotine dependence, cigarettes, uncomplicated: Secondary | ICD-10-CM | POA: Insufficient documentation

## 2017-07-12 MED ORDER — TRIAMCINOLONE ACETONIDE 0.1 % EX CREA: 1.0000 "application " | TOPICAL_CREAM | Freq: Two times a day (BID) | CUTANEOUS | 0 refills | Status: DC

## 2017-07-12 NOTE — ED Triage Notes (Signed)
Pt reports to having rash to legs and groin "for a long time now."

## 2017-07-12 NOTE — Discharge Instructions (Signed)
To find a primary care or specialty doctor please call [phone] or [phone] to access " Find a Doctor Service."  You may also go on the [facility] website at [email or web address]  There are also multiple [facility], [facility], [facility] and [facility] practices throughout [place] that are frequently accepting new patients. You may find a clinic that is close to your home and contact them.  [facility] -  [address] [place] [phone]   [facility] -  [address] [place] [id number] [phone]   [facility] - [address] [place] [address]  [place] [phone]     [facility]:   [facility]  [address] [place]  [phone]   Dr. Mertha FindersJohn H. Hall Jr, MD  [address]  [phone]  [facility]  [address]  [phone]   [facility]  [address]  [phone]  Janalyn Harderafeen Stuart MD  [address]  [phone]  [facility]  [address]  [phone]  [place]azil[place]Jordan Amy Y MD  [address]  [phone]  [facility]  [address]  [phone]

## 2017-07-12 NOTE — ED Provider Notes (Signed)
TIME SEEN: 5:10 AM  CHIEF COMPLAINT: Eczema  HPI: Patient is a 62 year old male with history of schizophrenia, alcohol abuse who presents to the emergency department with pruritic rash to his legs.  Has history of eczema and has been seen several times for the same.  Reports triamcinolone has helped him previously.  No fever.  No drainage.  ROS: See HPI Constitutional: no fever  Eyes: no drainage  ENT: no runny nose   Cardiovascular:  no chest pain  Resp: no SOB  GI: no vomiting GU: no dysuria Integumentary: no rash  Allergy: no hives  Musculoskeletal: no leg swelling  Neurological: no slurred speech ROS otherwise negative  PAST MEDICAL HISTORY/PAST SURGICAL HISTORY:  Past Medical History:  Diagnosis Date  . Alcohol abuse   . Alcohol related seizure (HCC) ~ 2007   "I've had one"  . Bipolar 1 disorder (HCC)   . Bipolar disorder, unspecified (HCC) 08/19/2013   History reported  . Depression   . Gallstones dx'd 08/04/2013  . GERD (gastroesophageal reflux disease)   . Mental disorder   . PUD (peptic ulcer disease)   . Schizophrenia (HCC)   . Tobacco use disorder 08/19/2013    MEDICATIONS:  Prior to Admission medications   Medication Sig Start Date End Date Taking? Authorizing Provider  famotidine (PEPCID) 20 MG tablet Take 1 tablet (20 mg total) by mouth 2 (two) times daily. 07/11/17   Couture, Cortni S, PA-C  triamcinolone cream (KENALOG) 0.1 % Apply 1 application topically 2 (two) times daily. Patient not taking: Reported on 07/11/2017 05/22/17   Robinson, [place] N, PA-C    ALLERGIES:  Allergies  Allergen Reactions  . Neosporin [Neomycin-Bacitracin Zn-Polymyx] Rash    SOCIAL HISTORY:  Social History   Tobacco Use  . Smoking status: Current Every Day Smoker    Packs/day: 1.00    Years: 40.00    Pack years: 40.00    Types: Cigarettes  . Smokeless tobacco: Never Used  . Tobacco comment: Refused Cessation Material  Substance Use Topics  . Alcohol use: Yes   Alcohol/week: 50.4 oz    Types: 84 Cans of beer per week    Comment: Drink at least a 12 pk a day    FAMILY HISTORY: Family History  Problem Relation Age of Onset  . Alcohol abuse Mother   . Alcohol abuse Father   . Alcohol abuse Brother   . Kidney disease Sister        ESRD-HD    EXAM: BP 108/66 (BP Location: Left Arm)   Pulse 94   Temp 98 F (36.7 C) (Oral)   Resp 18   Ht 5\' 8"  (1.727 m)   Wt 65.8 kg (145 lb)   SpO2 100%   BMI 22.05 kg/m  CONSTITUTIONAL: Alert and oriented and responds appropriately to questions. Well-appearing; well-nourished HEAD: Normocephalic EYES: Conjunctivae clear, pupils appear equal, EOMI ENT: normal nose; moist mucous membranes NECK: Supple, no meningismus, no nuchal rigidity, no LAD  CARD: RRR; S1 and S2 appreciated; no murmurs, no clicks, no rubs, no gallops RESP: Normal chest excursion without splinting or tachypnea; breath sounds clear and equal bilaterally; no wheezes, no rhonchi, no rales, no hypoxia or respiratory distress, speaking full sentences ABD/GI: Normal bowel sounds; non-distended; soft, non-tender, no rebound, no guarding, no peritoneal signs, no hepatosplenomegaly BACK:  The back appears normal and is non-tender to palpation, there is no CVA tenderness EXT: Normal ROM in all joints; non-tender to palpation; no edema; normal capillary refill; no cyanosis, no  calf tenderness or swelling    SKIN: Normal color for age and race; warm; eczematous rash noted to the back of both of his legs without sign of superimposed infection, no satellite lesions, no drainage or bleeding, some excoriations noted.  No blisters or desquamation.  No petechiae or purpura.  No hives. NEURO: Moves all extremities equally PSYCH: The patient's mood and manner are appropriate.  Poor grooming.  MEDICAL DECISION MAKING: Patient here with eczema.  Has been seen several times for the same.  Will discharge with another prescription of triamcinolone as he reports  this is helped him before and have provided him with a coupon as well.  I feel he is safe for discharge.  No sign of superimposed infection.  No sign of life-threatening rash including urticaria, cellulitis, necrotizing fasciitis, Stevens-Johnson syndrome, TPN.  At this time, I do not feel there is any life-threatening condition present. I have reviewed and discussed all results (EKG, imaging, lab, urine as appropriate) and exam findings with patient/family. I have reviewed nursing notes and appropriate previous records.  I feel the patient is safe to be discharged home without further emergent workup and can continue workup as an outpatient as needed. Discussed usual and customary return precautions. Patient/family verbalize understanding and are comfortable with this plan.  Outpatient follow-up has been provided if needed. All questions have been answered.      Henrick Mcgue, Layla Maw, DO 07/12/17 [phone]

## 2017-07-13 NOTE — Care Management Note (Signed)
Case Management Note  CM consulted for medication assistance.  CM noted [facility] and has no phone number, no ins, and no pcp.  CM contacted Lavinia SharpsMary Ann Placey, [facility] and advised of pt's needs.  She reports the name is familiar and will be on the look out for him to assist.  No further CM needs noted at this time.

## 2017-07-19 ENCOUNTER — Emergency Department (HOSPITAL_COMMUNITY)
Admission: EM | Admit: 2017-07-19 | Discharge: 2017-07-20 | Disposition: A | Discharge status: Home / Self Care | Payer: Self-pay | Attending: Emergency Medicine | Admitting: Emergency Medicine

## 2017-07-19 DIAGNOSIS — R112 Nausea with vomiting, unspecified: Secondary | ICD-10-CM | POA: Insufficient documentation

## 2017-07-19 DIAGNOSIS — F17210 Nicotine dependence, cigarettes, uncomplicated: Secondary | ICD-10-CM | POA: Insufficient documentation

## 2017-07-19 DIAGNOSIS — R110 Nausea: Secondary | ICD-10-CM

## 2017-07-19 LAB — CBC WITH DIFFERENTIAL/PLATELET
BASOS ABS: 0.1 10*3/uL (ref 0.0–0.1)
BASOS PCT: 1 %
Eosinophils Absolute: 0.6 10*3/uL (ref 0.0–0.7)
Eosinophils Relative: 9 %
HCT: 33.2 % — ABNORMAL LOW (ref 39.0–52.0)
HEMOGLOBIN: 11.5 g/dL — AB (ref 13.0–17.0)
LYMPHS PCT: 25 %
Lymphs Abs: 1.5 10*3/uL (ref 0.7–4.0)
MCH: 33.7 pg (ref 26.0–34.0)
MCHC: 34.6 g/dL (ref 30.0–36.0)
MCV: 97.4 fL (ref 78.0–100.0)
Monocytes Absolute: 0.5 10*3/uL (ref 0.1–1.0)
Monocytes Relative: 9 %
NEUTROS ABS: 3.5 10*3/uL (ref 1.7–7.7)
NEUTROS PCT: 56 %
Platelets: 428 10*3/uL — ABNORMAL HIGH (ref 150–400)
RBC: 3.41 MIL/uL — ABNORMAL LOW (ref 4.22–5.81)
RDW: 12.6 % (ref 11.5–15.5)
WBC: 6.2 10*3/uL (ref 4.0–10.5)

## 2017-07-19 LAB — COMPREHENSIVE METABOLIC PANEL
ALBUMIN: 3.9 g/dL (ref 3.5–5.0)
ALT: 17 U/L (ref 17–63)
ANION GAP: 13 (ref 5–15)
AST: 28 U/L (ref 15–41)
Alkaline Phosphatase: 65 U/L (ref 38–126)
BILIRUBIN TOTAL: 0.2 mg/dL — AB (ref 0.3–1.2)
BUN: 10 mg/dL (ref 6–20)
CALCIUM: 9.4 mg/dL (ref 8.9–10.3)
CO2: 27 mmol/L (ref 22–32)
Chloride: 96 mmol/L — ABNORMAL LOW (ref 101–111)
Creatinine, Ser: 0.93 mg/dL (ref 0.61–1.24)
GFR calc non Af Amer: >60 mL/min (ref >60)
Glucose, Bld: 103 mg/dL — ABNORMAL HIGH (ref 65–99)
POTASSIUM: 3.1 mmol/L — AB (ref 3.5–5.1)
SODIUM: 136 mmol/L (ref 135–145)
TOTAL PROTEIN: 7.5 g/dL (ref 6.5–8.1)

## 2017-07-19 LAB — LIPASE, BLOOD: Lipase: 40 U/L (ref 11–51)

## 2017-07-19 LAB — I-STAT TROPONIN, ED: TROPONIN I, POC: 0 ng/mL (ref 0.00–0.08)

## 2017-07-19 MED ORDER — SODIUM CHLORIDE 0.9 % IV BOLUS
1000.0000 mL | Freq: Once | INTRAVENOUS | Status: AC
  Administered 2017-07-19: 1000 mL via INTRAVENOUS

## 2017-07-19 MED ORDER — GI COCKTAIL ~~LOC~~
30.0000 mL | Freq: Once | ORAL | Status: AC
  Administered 2017-07-19: 30 mL via ORAL
  Filled 2017-07-19: qty 30

## 2017-07-19 MED ORDER — FAMOTIDINE IN NACL 20-0.9 MG/50ML-% IV SOLN
20.0000 mg | Freq: Once | INTRAVENOUS | Status: AC
  Administered 2017-07-19: 20 mg via INTRAVENOUS
  Filled 2017-07-19: qty 50

## 2017-07-19 MED ORDER — ONDANSETRON HCL 4 MG/2ML IJ SOLN
4.0000 mg | Freq: Once | INTRAMUSCULAR | Status: AC
  Administered 2017-07-19: 4 mg via INTRAVENOUS
  Filled 2017-07-19: qty 2
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
## 2017-07-19 NOTE — ED Notes (Signed)
EKG given to EDP,Zammitt,MD., for review. 

## 2017-07-19 NOTE — ED Notes (Signed)
Bed: WU98 Expected date:  Expected time:  Means of arrival:  Comments: 62 yo M/ NV X 2 DAYS

## 2017-07-19 NOTE — ED Triage Notes (Signed)
Pt to ED via [facility] with c/o of N/V for the last 2 days. Pt was picked up from store front.  EMS Vitals Bp 142/80 Pulse 98 98% RA CBG 81

## 2017-07-20 LAB — URINALYSIS, ROUTINE W REFLEX MICROSCOPIC
BACTERIA UA: NONE SEEN
BILIRUBIN URINE: NEGATIVE
Glucose, UA: NEGATIVE mg/dL
HGB URINE DIPSTICK: NEGATIVE
KETONES UR: 20 mg/dL — AB
LEUKOCYTES UA: NEGATIVE
NITRITE: NEGATIVE
PH: 8 (ref 5.0–8.0)
Protein, ur: 30 mg/dL — AB
SPECIFIC GRAVITY, URINE: 1.021 (ref 1.005–1.030)

## 2017-07-20 LAB — POC OCCULT BLOOD, ED: Fecal Occult Bld: NEGATIVE

## 2017-07-20 MED ORDER — FAMOTIDINE 20 MG PO TABS: 20.0000 mg | ORAL_TABLET | Freq: Two times a day (BID) | ORAL | 0 refills | Status: DC

## 2017-07-20 MED ORDER — ONDANSETRON 4 MG PO TBDP: 4.0000 mg | ORAL_TABLET | Freq: Three times a day (TID) | ORAL | 0 refills | Status: DC | PRN

## 2017-07-20 MED ORDER — POTASSIUM CHLORIDE CRYS ER 20 MEQ PO TBCR
40.0000 meq | EXTENDED_RELEASE_TABLET | Freq: Once | ORAL | Status: AC
  Administered 2017-07-20: 40 meq via ORAL
  Filled 2017-07-20: qty 2

## 2017-07-20 NOTE — Discharge Instructions (Signed)
Your abdominal pain is likely from gastritis, reflux or a stomach ulcer. You will need to take the prescribed proton pump inhibitor as directed, and avoid spicy/fatty/acidic foods. Avoid laying down flat within 30 minutes of eating. Avoid NSAIDs like ibuprofen or Aleve on an empty stomach. Use zofran as needed for nausea. Follow up with the gastroenterologist (GI doctor) listed for ongoing evaluation of your abdominal pain. Return to the ER for new or worsening symptoms, any additional concers. ° ° °SEEK IMMEDIATE MEDICAL ATTENTION IF YOU DEVELOP ANY OF THE FOLLOWING SYMPTOMS: °The pain does not go away or becomes severe.  °A temperature above 101 develops.  °Repeated vomiting occurs (multiple episodes).  °Blood is being passed in stools or vomit (bright red or black tarry stools).  °Return also if you develop chest pain, difficulty breathing, dizziness or fainting ° °

## 2017-07-27 ENCOUNTER — Other Ambulatory Visit: Payer: Self-pay

## 2017-07-27 ENCOUNTER — Emergency Department (HOSPITAL_BASED_OUTPATIENT_CLINIC_OR_DEPARTMENT_OTHER)
Admission: EM | Admit: 2017-07-27 | Discharge: 2017-07-27 | Disposition: A | Discharge status: Home / Self Care | Payer: Self-pay | Attending: Emergency Medicine | Admitting: Emergency Medicine

## 2017-07-27 ENCOUNTER — Encounter (HOSPITAL_BASED_OUTPATIENT_CLINIC_OR_DEPARTMENT_OTHER): Payer: Self-pay | Admitting: *Deleted

## 2017-07-27 DIAGNOSIS — K2920 Alcoholic gastritis without bleeding: Secondary | ICD-10-CM | POA: Insufficient documentation

## 2017-07-27 DIAGNOSIS — F17210 Nicotine dependence, cigarettes, uncomplicated: Secondary | ICD-10-CM | POA: Insufficient documentation

## 2017-07-27 DIAGNOSIS — R1013 Epigastric pain: Secondary | ICD-10-CM

## 2017-07-27 LAB — COMPREHENSIVE METABOLIC PANEL
ALT: 17 U/L (ref 17–63)
ANION GAP: 15 (ref 5–15)
AST: 40 U/L (ref 15–41)
Albumin: 4.3 g/dL (ref 3.5–5.0)
Alkaline Phosphatase: 72 U/L (ref 38–126)
BUN: 7 mg/dL (ref 6–20)
CHLORIDE: 90 mmol/L — AB (ref 101–111)
CO2: 28 mmol/L (ref 22–32)
Calcium: 10.6 mg/dL — ABNORMAL HIGH (ref 8.9–10.3)
Creatinine, Ser: 0.9 mg/dL (ref 0.61–1.24)
Glucose, Bld: 113 mg/dL — ABNORMAL HIGH (ref 65–99)
POTASSIUM: 3.5 mmol/L (ref 3.5–5.1)
Sodium: 133 mmol/L — ABNORMAL LOW (ref 135–145)
Total Bilirubin: 0.6 mg/dL (ref 0.3–1.2)
Total Protein: 8.3 g/dL — ABNORMAL HIGH (ref 6.5–8.1)

## 2017-07-27 LAB — CBC WITH DIFFERENTIAL/PLATELET
Basophils Absolute: 0.1 10*3/uL (ref 0.0–0.1)
Basophils Relative: 1 %
EOS PCT: 6 %
Eosinophils Absolute: 0.4 10*3/uL (ref 0.0–0.7)
HCT: 35.8 % — ABNORMAL LOW (ref 39.0–52.0)
Hemoglobin: 12.9 g/dL — ABNORMAL LOW (ref 13.0–17.0)
LYMPHS PCT: 23 %
Lymphs Abs: 1.5 10*3/uL (ref 0.7–4.0)
MCH: 34 pg (ref 26.0–34.0)
MCHC: 36 g/dL (ref 30.0–36.0)
MCV: 94.5 fL (ref 78.0–100.0)
MONO ABS: 0.8 10*3/uL (ref 0.1–1.0)
Monocytes Relative: 13 %
Neutro Abs: 3.8 10*3/uL (ref 1.7–7.7)
Neutrophils Relative %: 57 %
PLATELETS: 386 10*3/uL (ref 150–400)
RBC: 3.79 MIL/uL — ABNORMAL LOW (ref 4.22–5.81)
RDW: 11.5 % (ref 11.5–15.5)
WBC: 6.5 10*3/uL (ref 4.0–10.5)

## 2017-07-27 LAB — LIPASE, BLOOD: LIPASE: 32 U/L (ref 11–51)

## 2017-07-27 MED ORDER — SUCRALFATE 1 G PO TABS: 1.0000 g | ORAL_TABLET | Freq: Three times a day (TID) | ORAL | 0 refills | Status: DC

## 2017-07-27 MED ORDER — GI COCKTAIL ~~LOC~~
30.0000 mL | Freq: Once | ORAL | Status: AC
  Administered 2017-07-27: 30 mL via ORAL
  Filled 2017-07-27: qty 30

## 2017-07-27 MED ORDER — SUCRALFATE 1 G PO TABS
1.0000 g | ORAL_TABLET | Freq: Once | ORAL | Status: AC
  Administered 2017-07-27: 1 g via ORAL
  Filled 2017-07-27: qty 1

## 2017-07-27 MED ORDER — ONDANSETRON HCL 8 MG PO TABS
4.0000 mg | ORAL_TABLET | Freq: Once | ORAL | Status: AC
  Administered 2017-07-27: 4 mg via ORAL
  Filled 2017-07-27: qty 1

## 2017-07-27 MED ORDER — OMEPRAZOLE 20 MG PO CPDR: 20.0000 mg | DELAYED_RELEASE_CAPSULE | Freq: Every day | ORAL | 0 refills | Status: DC
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
## 2017-07-27 NOTE — ED Triage Notes (Signed)
C/o abdominal pain and vomiting that started today. Pt c/o nausea. etoh per EMS  report. Pt arrived via [facility].  States pain is constant. Denies fevers.

## 2017-07-27 NOTE — ED Notes (Signed)
Alert, NAD, calm, interactive, resps e/u, speaking in clear complete sentences, no dyspnea noted, skin W&D, initial VSS, c/o generalized abd pain, onset today, constant, thinks it may be related to ETOH &/or smoking, no meds PTA, (denies: sob, urinary sx, fever, NVD, bleeding, dizziness or visual changes). Pt frustrated and impatient with questions.

## 2017-07-27 NOTE — ED Notes (Signed)
Given food/drink

## 2017-07-27 NOTE — ED Notes (Signed)
EDP into room at [facility], prior to RN assessment, see MD notes, orders received and intiated.

## 2017-07-27 NOTE — Discharge Instructions (Addendum)
You were seen today in for abdominal pain.  You need to stop drinking.  Take medications as prescribed.  Follow-up with gastroenterology if not improving.

## 2017-07-27 NOTE — ED Notes (Signed)
Pt was given coke and snacks to eat on .

## 2017-08-26 ENCOUNTER — Encounter (HOSPITAL_COMMUNITY): Payer: Self-pay

## 2017-08-26 ENCOUNTER — Emergency Department (HOSPITAL_COMMUNITY)
Admission: EM | Admit: 2017-08-26 | Discharge: 2017-08-27 | Disposition: A | Discharge status: Home / Self Care | Payer: Self-pay | Attending: Emergency Medicine | Admitting: Emergency Medicine

## 2017-08-26 DIAGNOSIS — K2920 Alcoholic gastritis without bleeding: Secondary | ICD-10-CM | POA: Insufficient documentation

## 2017-08-26 DIAGNOSIS — F17210 Nicotine dependence, cigarettes, uncomplicated: Secondary | ICD-10-CM | POA: Insufficient documentation

## 2017-08-26 DIAGNOSIS — F1010 Alcohol abuse, uncomplicated: Secondary | ICD-10-CM | POA: Insufficient documentation

## 2017-08-26 DIAGNOSIS — Z79899 Other long term (current) drug therapy: Secondary | ICD-10-CM | POA: Insufficient documentation

## 2017-08-26 MED ORDER — METOCLOPRAMIDE HCL 5 MG/ML IJ SOLN
10.0000 mg | Freq: Once | INTRAMUSCULAR | Status: AC
  Administered 2017-08-27: 10 mg via INTRAVENOUS
  Filled 2017-08-26: qty 2

## 2017-08-26 MED ORDER — SODIUM CHLORIDE 0.9 % IV BOLUS
1000.0000 mL | Freq: Once | INTRAVENOUS | Status: AC
  Administered 2017-08-27: 1000 mL via INTRAVENOUS

## 2017-08-26 MED ORDER — GI COCKTAIL ~~LOC~~
30.0000 mL | Freq: Once | ORAL | Status: AC
  Administered 2017-08-27: 30 mL via ORAL
  Filled 2017-08-26: qty 30

## 2017-08-26 NOTE — ED Triage Notes (Signed)
Pt complains of abdominal pain and vomiting today

## 2017-08-27 LAB — COMPREHENSIVE METABOLIC PANEL
ALK PHOS: 75 U/L (ref 38–126)
ALT: 11 U/L — ABNORMAL LOW (ref 17–63)
ANION GAP: 10 (ref 5–15)
AST: 27 U/L (ref 15–41)
Albumin: 4 g/dL (ref 3.5–5.0)
BILIRUBIN TOTAL: 0.5 mg/dL (ref 0.3–1.2)
BUN: 9 mg/dL (ref 6–20)
CALCIUM: 10.7 mg/dL — AB (ref 8.9–10.3)
CO2: 32 mmol/L (ref 22–32)
Chloride: 93 mmol/L — ABNORMAL LOW (ref 101–111)
Creatinine, Ser: 1.04 mg/dL (ref 0.61–1.24)
GFR calc non Af Amer: >60 mL/min (ref >60)
Glucose, Bld: 106 mg/dL — ABNORMAL HIGH (ref 65–99)
POTASSIUM: 3.7 mmol/L (ref 3.5–5.1)
SODIUM: 135 mmol/L (ref 135–145)
TOTAL PROTEIN: 7.8 g/dL (ref 6.5–8.1)

## 2017-08-27 LAB — LIPASE, BLOOD: Lipase: 31 U/L (ref 11–51)

## 2017-08-27 LAB — CBC WITH DIFFERENTIAL/PLATELET
BASOS PCT: 1 %
Basophils Absolute: 0 10*3/uL (ref 0.0–0.1)
EOS ABS: 1.2 10*3/uL — AB (ref 0.0–0.7)
Eosinophils Relative: 19 %
HEMATOCRIT: 38.6 % — AB (ref 39.0–52.0)
Hemoglobin: 13.2 g/dL (ref 13.0–17.0)
LYMPHS ABS: 1.8 10*3/uL (ref 0.7–4.0)
Lymphocytes Relative: 27 %
MCH: 32.4 pg (ref 26.0–34.0)
MCHC: 34.2 g/dL (ref 30.0–36.0)
MCV: 94.8 fL (ref 78.0–100.0)
MONO ABS: 0.6 10*3/uL (ref 0.1–1.0)
MONOS PCT: 10 %
Neutro Abs: 2.9 10*3/uL (ref 1.7–7.7)
Neutrophils Relative %: 43 %
Platelets: 341 10*3/uL (ref 150–400)
RBC: 4.07 MIL/uL — ABNORMAL LOW (ref 4.22–5.81)
RDW: 13.4 % (ref 11.5–15.5)
WBC: 6.5 10*3/uL (ref 4.0–10.5)
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
## 2017-12-17 ENCOUNTER — Emergency Department (HOSPITAL_COMMUNITY)
Admission: EM | Admit: 2017-12-17 | Discharge: 2017-12-17 | Disposition: A | Discharge status: Home / Self Care | Payer: Self-pay | Attending: Emergency Medicine | Admitting: Emergency Medicine

## 2017-12-17 ENCOUNTER — Encounter (HOSPITAL_COMMUNITY): Payer: Self-pay

## 2017-12-17 DIAGNOSIS — F10229 Alcohol dependence with intoxication, unspecified: Secondary | ICD-10-CM | POA: Insufficient documentation

## 2017-12-17 DIAGNOSIS — F1924 Other psychoactive substance dependence with psychoactive substance-induced mood disorder: Secondary | ICD-10-CM | POA: Insufficient documentation

## 2017-12-17 DIAGNOSIS — E871 Hypo-osmolality and hyponatremia: Secondary | ICD-10-CM | POA: Insufficient documentation

## 2017-12-17 DIAGNOSIS — Z79899 Other long term (current) drug therapy: Secondary | ICD-10-CM | POA: Insufficient documentation

## 2017-12-17 DIAGNOSIS — F10920 Alcohol use, unspecified with intoxication, uncomplicated: Secondary | ICD-10-CM

## 2017-12-17 DIAGNOSIS — F1010 Alcohol abuse, uncomplicated: Secondary | ICD-10-CM

## 2017-12-17 DIAGNOSIS — F1410 Cocaine abuse, uncomplicated: Secondary | ICD-10-CM | POA: Insufficient documentation

## 2017-12-17 DIAGNOSIS — F17210 Nicotine dependence, cigarettes, uncomplicated: Secondary | ICD-10-CM | POA: Insufficient documentation

## 2017-12-17 LAB — CBC WITH DIFFERENTIAL/PLATELET
Abs Immature Granulocytes: 0 10*3/uL (ref 0.0–0.1)
Basophils Absolute: 0.1 10*3/uL (ref 0.0–0.1)
Basophils Relative: 2 %
EOS ABS: 1.4 10*3/uL — AB (ref 0.0–0.7)
Eosinophils Relative: 25 %
HEMATOCRIT: 40.4 % (ref 39.0–52.0)
Hemoglobin: 13.4 g/dL (ref 13.0–17.0)
Immature Granulocytes: 0 %
Lymphocytes Relative: 39 %
Lymphs Abs: 2.3 10*3/uL (ref 0.7–4.0)
MCH: 30.7 pg (ref 26.0–34.0)
MCHC: 33.2 g/dL (ref 30.0–36.0)
MCV: 92.4 fL (ref 78.0–100.0)
MONO ABS: 0.5 10*3/uL (ref 0.1–1.0)
MONOS PCT: 9 %
Neutro Abs: 1.4 10*3/uL — ABNORMAL LOW (ref 1.7–7.7)
Neutrophils Relative %: 25 %
Platelets: 250 10*3/uL (ref 150–400)
RBC: 4.37 MIL/uL (ref 4.22–5.81)
RDW: 15.1 % (ref 11.5–15.5)
WBC: 5.6 10*3/uL (ref 4.0–10.5)

## 2017-12-17 LAB — COMPREHENSIVE METABOLIC PANEL
ALK PHOS: 90 U/L (ref 38–126)
ALT: 15 U/L (ref 0–44)
ANION GAP: 9 (ref 5–15)
AST: 32 U/L (ref 15–41)
Albumin: 3.6 g/dL (ref 3.5–5.0)
BILIRUBIN TOTAL: 0.4 mg/dL (ref 0.3–1.2)
CALCIUM: 8.8 mg/dL — AB (ref 8.9–10.3)
CO2: 23 mmol/L (ref 22–32)
CREATININE: 0.78 mg/dL (ref 0.61–1.24)
Chloride: 97 mmol/L — ABNORMAL LOW (ref 98–111)
GFR calc Af Amer: >60 mL/min (ref >60)
GFR calc non Af Amer: >60 mL/min (ref >60)
GLUCOSE: 87 mg/dL (ref 70–99)
Potassium: 3.5 mmol/L (ref 3.5–5.1)
Sodium: 129 mmol/L — ABNORMAL LOW (ref 135–145)
TOTAL PROTEIN: 7.9 g/dL (ref 6.5–8.1)

## 2017-12-17 LAB — ETHANOL: Alcohol, Ethyl (B): 143 mg/dL — ABNORMAL HIGH (ref <10)

## 2017-12-17 MED ORDER — ALUM & MAG HYDROXIDE-SIMETH 200-200-20 MG/5ML PO SUSP: 30.0000 mL | Freq: Four times a day (QID) | ORAL | Status: DC | PRN

## 2017-12-17 MED ORDER — VITAMIN B-1 100 MG PO TABS: 100.0000 mg | ORAL_TABLET | Freq: Every day | ORAL | Status: DC

## 2017-12-17 MED ORDER — LORAZEPAM 1 MG PO TABS: 0.0000 mg | ORAL_TABLET | Freq: Two times a day (BID) | ORAL | Status: DC

## 2017-12-17 MED ORDER — IBUPROFEN 400 MG PO TABS: 600.0000 mg | ORAL_TABLET | Freq: Three times a day (TID) | ORAL | Status: DC | PRN

## 2017-12-17 MED ORDER — THIAMINE HCL 100 MG/ML IJ SOLN: 100.0000 mg | Freq: Every day | INTRAMUSCULAR | Status: DC

## 2017-12-17 MED ORDER — LORAZEPAM 1 MG PO TABS: 0.0000 mg | ORAL_TABLET | Freq: Four times a day (QID) | ORAL | Status: DC

## 2017-12-17 MED ORDER — ONDANSETRON HCL 4 MG PO TABS: 4.0000 mg | ORAL_TABLET | Freq: Three times a day (TID) | ORAL | Status: DC | PRN

## 2017-12-17 MED ORDER — ZOLPIDEM TARTRATE 5 MG PO TABS: 5.0000 mg | ORAL_TABLET | Freq: Every evening | ORAL | Status: DC | PRN

## 2017-12-17 MED ORDER — NICOTINE 14 MG/24HR TD PT24: 14.0000 mg | MEDICATED_PATCH | Freq: Every day | TRANSDERMAL | Status: DC

## 2017-12-17 MED ORDER — LORAZEPAM 2 MG/ML IJ SOLN: 0.0000 mg | Freq: Two times a day (BID) | INTRAMUSCULAR | Status: DC

## 2017-12-17 MED ORDER — LORAZEPAM 2 MG/ML IJ SOLN: 0.0000 mg | Freq: Four times a day (QID) | INTRAMUSCULAR | Status: DC

## 2017-12-17 NOTE — ED Provider Notes (Signed)
  Provider Note MRN:  [id number]  Arrival date & time: 12/17/17    ED Course and Medical Decision Making  As the default provider, I was called to evaluate this patient.  Patient was kept overnight for possible need for TTS consult and help with alcohol detox.  Patient is awake, conversant, no evidence of alcohol withdrawal.  Requesting discharge.  Explains that he never really wanted to quit drinking alcohol, was just sleeping on the ground last night and was uncomfortable and thought he would come to the emergency department for the night.  Not a risk to himself or others, appropriate for discharge.  Does not wish to stay for any further consulting services, such as peers support.   After the discussed management above, the patient was determined to be safe for discharge.  The patient was in agreement with this plan and all questions regarding their care were answered.  ED return precautions were discussed and the patient will return to the ED with any significant worsening of condition.  Elmer Sow. Pilar Plate, MD [facility] Emergency Medicine [facility] [email or web address]     Sabas Sous, MD 12/17/17 [phone]

## 2017-12-17 NOTE — Progress Notes (Signed)
Spoke with Benancio Deeds, RN who requested additional time to locate telepsych cart and place in pt's room. Will call back in 5 minutes.   Princess Bruins, MSW, LCSW Therapeutic Triage Specialist  [phone]'
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
## 2017-12-17 NOTE — ED Notes (Signed)
Notified Dr. Pilar Plate patient requesting discharge.

## 2017-12-17 NOTE — Consult Note (Signed)
  Tele Assessment   Jimmy [name], 62 y.o., male patient with history of cocaine abuse, alcohol abuse, seizures with alcohol withdrawal, and bipolar disorder presented to [facility] intoxicated denying suicidal/homicidal ideation but reported auditory hallucinations.  Patient discharged by EDP prior tele assessment related to no complaints of suicidal/homicidal ideation and reporting to EDP that  He only came to hospital because was uncomfortable sleeping on ground.      Assunta Found, NP

## 2017-12-17 NOTE — Progress Notes (Signed)
[name], NP recommends peer support consult to assist with substance abuse issues. EDP Dione Booze, MD and pt's nurse Benancio Deeds, RN have been advised,   Princess Bruins, MSW, LCSW Therapeutic Triage Specialist  [phone]

## 2017-12-17 NOTE — ED Notes (Signed)
Pt denies SI and HI at this time, per ems pt was saying he was going to kill himself and everybody but he states he only said that because he wants to get a into a detox and he states "he is not really SI or HI."

## 2017-12-17 NOTE — ED Triage Notes (Signed)
Pt has his last drink today, pt drinks a 12 pack a day would like to detox

## 2017-12-17 NOTE — Discharge Instructions (Addendum)
You were evaluated in the Emergency Department and after careful evaluation, we did not find any emergent condition requiring admission or further testing in the hospital. ° °Please return to the Emergency Department if you experience any worsening of your condition.  We encourage you to follow up with a primary care provider.  Thank you for allowing us to be a part of your care. °

## 2017-12-17 NOTE — ED Notes (Signed)
Pt belongings have been inventoried placed in locker 5 and valuables with security. Pt has been wand.

## 2018-03-04 ENCOUNTER — Emergency Department (HOSPITAL_COMMUNITY)
Admission: EM | Admit: 2018-03-04 | Discharge: 2018-03-05 | Disposition: A | Discharge status: Home / Self Care | Payer: Self-pay | Attending: Emergency Medicine | Admitting: Emergency Medicine

## 2018-03-04 DIAGNOSIS — R45851 Suicidal ideations: Secondary | ICD-10-CM | POA: Insufficient documentation

## 2018-03-04 DIAGNOSIS — F1020 Alcohol dependence, uncomplicated: Secondary | ICD-10-CM | POA: Insufficient documentation

## 2018-03-04 DIAGNOSIS — Y901 Blood alcohol level of 20-39 mg/100 ml: Secondary | ICD-10-CM | POA: Insufficient documentation

## 2018-03-04 DIAGNOSIS — F17210 Nicotine dependence, cigarettes, uncomplicated: Secondary | ICD-10-CM | POA: Insufficient documentation

## 2018-03-04 DIAGNOSIS — Z590 Homelessness: Secondary | ICD-10-CM | POA: Insufficient documentation

## 2018-03-04 DIAGNOSIS — F319 Bipolar disorder, unspecified: Secondary | ICD-10-CM | POA: Insufficient documentation

## 2018-03-04 LAB — COMPREHENSIVE METABOLIC PANEL
ALK PHOS: 66 U/L (ref 38–126)
ALT: 21 U/L (ref 0–44)
AST: 29 U/L (ref 15–41)
Albumin: 4.1 g/dL (ref 3.5–5.0)
Anion gap: 11 (ref 5–15)
BUN: 7 mg/dL — ABNORMAL LOW (ref 8–23)
CO2: 23 mmol/L (ref 22–32)
Calcium: 8.7 mg/dL — ABNORMAL LOW (ref 8.9–10.3)
Chloride: 101 mmol/L (ref 98–111)
Creatinine, Ser: 0.86 mg/dL (ref 0.61–1.24)
GFR calc Af Amer: >60 mL/min (ref >60)
GFR calc non Af Amer: >60 mL/min (ref >60)
Glucose, Bld: 81 mg/dL (ref 70–99)
Potassium: 4.1 mmol/L (ref 3.5–5.1)
Sodium: 135 mmol/L (ref 135–145)
Total Bilirubin: 0.7 mg/dL (ref 0.3–1.2)
Total Protein: 7.6 g/dL (ref 6.5–8.1)

## 2018-03-04 LAB — CBC
HCT: 39.6 % (ref 39.0–52.0)
Hemoglobin: 13.1 g/dL (ref 13.0–17.0)
MCH: 31.3 pg (ref 26.0–34.0)
MCHC: 33.1 g/dL (ref 30.0–36.0)
MCV: 94.5 fL (ref 80.0–100.0)
Platelets: 265 10*3/uL (ref 150–400)
RBC: 4.19 MIL/uL — ABNORMAL LOW (ref 4.22–5.81)
RDW: 15 % (ref 11.5–15.5)
WBC: 7.1 10*3/uL (ref 4.0–10.5)
nRBC: 0 % (ref 0.0–0.2)

## 2018-03-04 LAB — ETHANOL: Alcohol, Ethyl (B): 29 mg/dL — ABNORMAL HIGH (ref <10)

## 2018-03-04 LAB — SALICYLATE LEVEL: Salicylate Lvl: <7 mg/dL (ref 2.8–30.0)

## 2018-03-04 LAB — ACETAMINOPHEN LEVEL: Acetaminophen (Tylenol), Serum: <10 ug/mL — ABNORMAL LOW (ref 10–30)

## 2018-03-04 NOTE — ED Notes (Signed)
Pt stated to this writer, "I really ain't suicidal. I just need a place to sleep."  Writer asked pt about going to the local homeless shelter and pt stated, "I don't want to go there. Can I just sleep in the lobby?" EDP Wentz notified of pt's statement Pt seen walking out of triage rm Pt stated, "Im going to the lobby to go sleep." Triage RN notified
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
## 2018-03-04 NOTE — ED Triage Notes (Addendum)
Pt to Ed by [employer] with c/o of suicidal ideation. Pt states he has been feeling like wanted to be dead, and today has "gotten worse".  PT stated he had plans to break a bottle and slit his wrists before calling [employer]. Pt states "he thought he should call and get some help."

## 2018-03-04 NOTE — ED Notes (Signed)
Bed: [facility] Expected date:  Expected time:  Means of arrival:  Comments: 

## 2018-03-04 NOTE — BHH Counselor (Addendum)
As clinician approached the triage area she observed the pt leave. Per Joy, NT [facility]. Per Joy, NT pt denies SI. Clinician followed up with Dr. Effie ShyWentz. No TTS assessment needed.   Redmond Pullingreylese D Hansen Carino, MS, [facility], [facility] Triage Specialist [phone]

## 2018-04-07 ENCOUNTER — Encounter (HOSPITAL_COMMUNITY): Payer: Self-pay | Admitting: Emergency Medicine

## 2018-04-07 ENCOUNTER — Emergency Department (HOSPITAL_COMMUNITY)
Admission: EM | Admit: 2018-04-07 | Discharge: 2018-04-08 | Disposition: A | Discharge status: Home / Self Care | Payer: Self-pay | Attending: Emergency Medicine | Admitting: Emergency Medicine

## 2018-04-07 DIAGNOSIS — K2920 Alcoholic gastritis without bleeding: Secondary | ICD-10-CM | POA: Insufficient documentation

## 2018-04-07 DIAGNOSIS — F1010 Alcohol abuse, uncomplicated: Secondary | ICD-10-CM | POA: Insufficient documentation

## 2018-04-07 DIAGNOSIS — F17210 Nicotine dependence, cigarettes, uncomplicated: Secondary | ICD-10-CM | POA: Insufficient documentation

## 2018-04-07 MED ORDER — SODIUM CHLORIDE 0.9% FLUSH: 3.0000 mL | Freq: Once | INTRAVENOUS | Status: DC

## 2018-04-07 NOTE — ED Triage Notes (Signed)
BIB EMS, reports feeling "sick" X4 days. Poor historian, very vague. ETOH on board.

## 2018-04-08 LAB — URINALYSIS, ROUTINE W REFLEX MICROSCOPIC
Bilirubin Urine: NEGATIVE
Glucose, UA: NEGATIVE mg/dL
Hgb urine dipstick: NEGATIVE
Ketones, ur: NEGATIVE mg/dL
Leukocytes, UA: NEGATIVE
Nitrite: NEGATIVE
Protein, ur: NEGATIVE mg/dL
SPECIFIC GRAVITY, URINE: 1.004 — AB (ref 1.005–1.030)
pH: 7 (ref 5.0–8.0)

## 2018-04-08 LAB — CBC
HCT: 39.2 % (ref 39.0–52.0)
HEMOGLOBIN: 13.4 g/dL (ref 13.0–17.0)
MCH: 31.7 pg (ref 26.0–34.0)
MCHC: 34.2 g/dL (ref 30.0–36.0)
MCV: 92.7 fL (ref 80.0–100.0)
Platelets: 342 10*3/uL (ref 150–400)
RBC: 4.23 MIL/uL (ref 4.22–5.81)
RDW: 13.5 % (ref 11.5–15.5)
WBC: 6.1 10*3/uL (ref 4.0–10.5)
nRBC: 0 % (ref 0.0–0.2)

## 2018-04-08 LAB — COMPREHENSIVE METABOLIC PANEL
ALBUMIN: 3.7 g/dL (ref 3.5–5.0)
ALT: 20 U/L (ref 0–44)
AST: 33 U/L (ref 15–41)
Alkaline Phosphatase: 57 U/L (ref 38–126)
Anion gap: 12 (ref 5–15)
BUN: 5 mg/dL — ABNORMAL LOW (ref 8–23)
CO2: 28 mmol/L (ref 22–32)
Calcium: 9.2 mg/dL (ref 8.9–10.3)
Chloride: 89 mmol/L — ABNORMAL LOW (ref 98–111)
Creatinine, Ser: 0.79 mg/dL (ref 0.61–1.24)
GFR calc Af Amer: >60 mL/min (ref >60)
GFR calc non Af Amer: >60 mL/min (ref >60)
Glucose, Bld: 99 mg/dL (ref 70–99)
Potassium: 3.4 mmol/L — ABNORMAL LOW (ref 3.5–5.1)
Sodium: 129 mmol/L — ABNORMAL LOW (ref 135–145)
Total Bilirubin: 0.3 mg/dL (ref 0.3–1.2)
Total Protein: 7.6 g/dL (ref 6.5–8.1)

## 2018-04-08 LAB — LIPASE, BLOOD: Lipase: 38 U/L (ref 11–51)

## 2018-04-08 MED ORDER — ONDANSETRON 4 MG PO TBDP: 4.0000 mg | ORAL_TABLET | Freq: Three times a day (TID) | ORAL | 0 refills | Status: DC | PRN

## 2018-04-08 MED ORDER — HYOSCYAMINE SULFATE 0.125 MG SL SUBL
0.2500 mg | SUBLINGUAL_TABLET | Freq: Once | SUBLINGUAL | Status: AC
  Administered 2018-04-08: 0.25 mg via SUBLINGUAL
  Filled 2018-04-08: qty 2

## 2018-04-08 MED ORDER — LIDOCAINE VISCOUS HCL 2 % MT SOLN
15.0000 mL | Freq: Once | OROMUCOSAL | Status: AC
  Administered 2018-04-08: 15 mL via ORAL
  Filled 2018-04-08: qty 15

## 2018-04-08 MED ORDER — ALUM & MAG HYDROXIDE-SIMETH 200-200-20 MG/5ML PO SUSP
30.0000 mL | Freq: Once | ORAL | Status: AC
  Administered 2018-04-08: 30 mL via ORAL
  Filled 2018-04-08: qty 30

## 2018-04-08 MED ORDER — VITAMIN B-1 100 MG PO TABS
100.0000 mg | ORAL_TABLET | Freq: Once | ORAL | Status: AC
  Administered 2018-04-08: 100 mg via ORAL
  Filled 2018-04-08: qty 1

## 2018-04-08 MED ORDER — ONDANSETRON 4 MG PO TBDP
4.0000 mg | ORAL_TABLET | Freq: Once | ORAL | Status: AC
  Administered 2018-04-08: 4 mg via ORAL
  Filled 2018-04-08: qty 1

## 2018-04-08 NOTE — ED Notes (Signed)
Pt now reports N/V X4 days. Denies diarrhea, cough, fever/chills, SOB, etc.

## 2018-04-08 NOTE — Discharge Instructions (Addendum)
Get help right away if: °You vomit blood or material that looks like coffee grounds. °You have black or dark red stools. °You are unable to keep fluids down. °Your abdominal pain gets worse. °You have a fever. °You do not feel better after one week. °
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
## 2018-04-08 NOTE — ED Notes (Signed)
No answer for vitals recheck

## 2018-09-17 ENCOUNTER — Encounter (HOSPITAL_COMMUNITY): Payer: Self-pay | Admitting: Emergency Medicine

## 2018-09-17 ENCOUNTER — Emergency Department (HOSPITAL_COMMUNITY)
Admission: EM | Admit: 2018-09-17 | Discharge: 2018-09-17 | Disposition: A | Discharge status: Home / Self Care | Payer: Self-pay | Attending: Emergency Medicine | Admitting: Emergency Medicine

## 2018-09-17 ENCOUNTER — Other Ambulatory Visit: Payer: Self-pay

## 2018-09-17 DIAGNOSIS — F1010 Alcohol abuse, uncomplicated: Secondary | ICD-10-CM | POA: Insufficient documentation

## 2018-09-17 DIAGNOSIS — Y905 Blood alcohol level of 100-119 mg/100 ml: Secondary | ICD-10-CM | POA: Insufficient documentation

## 2018-09-17 DIAGNOSIS — Z79899 Other long term (current) drug therapy: Secondary | ICD-10-CM | POA: Insufficient documentation

## 2018-09-17 DIAGNOSIS — Z883 Allergy status to other anti-infective agents status: Secondary | ICD-10-CM | POA: Insufficient documentation

## 2018-09-17 DIAGNOSIS — K219 Gastro-esophageal reflux disease without esophagitis: Secondary | ICD-10-CM | POA: Insufficient documentation

## 2018-09-17 DIAGNOSIS — K273 Acute peptic ulcer, site unspecified, without hemorrhage or perforation: Secondary | ICD-10-CM | POA: Insufficient documentation

## 2018-09-17 DIAGNOSIS — Z9049 Acquired absence of other specified parts of digestive tract: Secondary | ICD-10-CM | POA: Insufficient documentation

## 2018-09-17 DIAGNOSIS — F17210 Nicotine dependence, cigarettes, uncomplicated: Secondary | ICD-10-CM | POA: Insufficient documentation

## 2018-09-17 DIAGNOSIS — R112 Nausea with vomiting, unspecified: Secondary | ICD-10-CM | POA: Insufficient documentation

## 2018-09-17 LAB — URINALYSIS, ROUTINE W REFLEX MICROSCOPIC
Bacteria, UA: NONE SEEN
Bilirubin Urine: NEGATIVE
Glucose, UA: NEGATIVE mg/dL
Ketones, ur: 20 mg/dL — AB
Leukocytes,Ua: NEGATIVE
Nitrite: NEGATIVE
Protein, ur: NEGATIVE mg/dL
Specific Gravity, Urine: 1.015 (ref 1.005–1.030)
pH: 5 (ref 5.0–8.0)

## 2018-09-17 LAB — CBC WITH DIFFERENTIAL/PLATELET
Abs Immature Granulocytes: 0.02 10*3/uL (ref 0.00–0.07)
Basophils Absolute: 0.1 10*3/uL (ref 0.0–0.1)
Basophils Relative: 2 %
Eosinophils Absolute: 0.6 10*3/uL — ABNORMAL HIGH (ref 0.0–0.5)
Eosinophils Relative: 7 %
HCT: 41 % (ref 39.0–52.0)
Hemoglobin: 14.5 g/dL (ref 13.0–17.0)
Immature Granulocytes: 0 %
Lymphocytes Relative: 20 %
Lymphs Abs: 1.6 10*3/uL (ref 0.7–4.0)
MCH: 32.9 pg (ref 26.0–34.0)
MCHC: 35.4 g/dL (ref 30.0–36.0)
MCV: 93 fL (ref 80.0–100.0)
Monocytes Absolute: 0.6 10*3/uL (ref 0.1–1.0)
Monocytes Relative: 7 %
Neutro Abs: 5.3 10*3/uL (ref 1.7–7.7)
Neutrophils Relative %: 64 %
Platelets: 280 10*3/uL (ref 150–400)
RBC: 4.41 MIL/uL (ref 4.22–5.81)
RDW: 12.2 % (ref 11.5–15.5)
WBC: 8.3 10*3/uL (ref 4.0–10.5)
nRBC: 0 % (ref 0.0–0.2)

## 2018-09-17 LAB — COMPREHENSIVE METABOLIC PANEL
ALT: 46 U/L — ABNORMAL HIGH (ref 0–44)
AST: 78 U/L — ABNORMAL HIGH (ref 15–41)
Albumin: 4 g/dL (ref 3.5–5.0)
Alkaline Phosphatase: 80 U/L (ref 38–126)
Anion gap: 18 — ABNORMAL HIGH (ref 5–15)
BUN: 7 mg/dL — ABNORMAL LOW (ref 8–23)
CO2: 17 mmol/L — ABNORMAL LOW (ref 22–32)
Calcium: 8.9 mg/dL (ref 8.9–10.3)
Chloride: 95 mmol/L — ABNORMAL LOW (ref 98–111)
Creatinine, Ser: 0.81 mg/dL (ref 0.61–1.24)
GFR calc Af Amer: >60 mL/min (ref >60)
GFR calc non Af Amer: >60 mL/min (ref >60)
Glucose, Bld: 70 mg/dL (ref 70–99)
Potassium: 4.7 mmol/L (ref 3.5–5.1)
Sodium: 130 mmol/L — ABNORMAL LOW (ref 135–145)
Total Bilirubin: 0.9 mg/dL (ref 0.3–1.2)
Total Protein: 7.9 g/dL (ref 6.5–8.1)

## 2018-09-17 LAB — ETHANOL: Alcohol, Ethyl (B): 119 mg/dL — ABNORMAL HIGH (ref <10)

## 2018-09-17 MED ORDER — ONDANSETRON HCL 4 MG/2ML IJ SOLN
4.0000 mg | Freq: Once | INTRAMUSCULAR | Status: AC
  Administered 2018-09-17: 4 mg via INTRAVENOUS
  Filled 2018-09-17: qty 2

## 2018-09-17 MED ORDER — ALUM & MAG HYDROXIDE-SIMETH 200-200-20 MG/5ML PO SUSP
15.0000 mL | Freq: Once | ORAL | Status: AC
  Administered 2018-09-17: 15 mL via ORAL
  Filled 2018-09-17: qty 30

## 2018-09-17 MED ORDER — PANTOPRAZOLE SODIUM 40 MG IV SOLR
40.0000 mg | Freq: Once | INTRAVENOUS | Status: AC
  Administered 2018-09-17: 40 mg via INTRAVENOUS
  Filled 2018-09-17: qty 40

## 2018-09-17 MED ORDER — SODIUM CHLORIDE 0.9 % IV BOLUS
1000.0000 mL | Freq: Once | INTRAVENOUS | Status: AC
  Administered 2018-09-17: 1000 mL via INTRAVENOUS

## 2018-09-17 NOTE — ED Notes (Signed)
Blood and urine collected, at bedside waiting for orders.
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
## 2018-09-17 NOTE — ED Notes (Signed)
Pt given a coke and bag lunch and tolerating well.

## 2018-09-25 ENCOUNTER — Emergency Department (HOSPITAL_COMMUNITY)
Admission: EM | Admit: 2018-09-25 | Discharge: 2018-09-26 | Disposition: A | Discharge status: Home / Self Care | Payer: Self-pay | Attending: Emergency Medicine | Admitting: Emergency Medicine

## 2018-09-25 ENCOUNTER — Encounter (HOSPITAL_COMMUNITY): Payer: Self-pay

## 2018-09-25 ENCOUNTER — Other Ambulatory Visit: Payer: Self-pay

## 2018-09-25 DIAGNOSIS — F1020 Alcohol dependence, uncomplicated: Secondary | ICD-10-CM | POA: Insufficient documentation

## 2018-09-25 DIAGNOSIS — F1014 Alcohol abuse with alcohol-induced mood disorder: Secondary | ICD-10-CM | POA: Diagnosis present

## 2018-09-25 DIAGNOSIS — R45851 Suicidal ideations: Secondary | ICD-10-CM | POA: Insufficient documentation

## 2018-09-25 DIAGNOSIS — F1010 Alcohol abuse, uncomplicated: Secondary | ICD-10-CM

## 2018-09-25 DIAGNOSIS — F1094 Alcohol use, unspecified with alcohol-induced mood disorder: Secondary | ICD-10-CM | POA: Insufficient documentation

## 2018-09-25 DIAGNOSIS — Y908 Blood alcohol level of 240 mg/100 ml or more: Secondary | ICD-10-CM | POA: Insufficient documentation

## 2018-09-25 DIAGNOSIS — Z008 Encounter for other general examination: Secondary | ICD-10-CM | POA: Insufficient documentation

## 2018-09-25 DIAGNOSIS — F17210 Nicotine dependence, cigarettes, uncomplicated: Secondary | ICD-10-CM | POA: Insufficient documentation

## 2018-09-25 LAB — CBC WITH DIFFERENTIAL/PLATELET
Abs Immature Granulocytes: 0.02 10*3/uL (ref 0.00–0.07)
Basophils Absolute: 0.1 10*3/uL (ref 0.0–0.1)
Basophils Relative: 2 %
Eosinophils Absolute: 1.3 10*3/uL — ABNORMAL HIGH (ref 0.0–0.5)
Eosinophils Relative: 23 %
HCT: 36.6 % — ABNORMAL LOW (ref 39.0–52.0)
Hemoglobin: 12.7 g/dL — ABNORMAL LOW (ref 13.0–17.0)
Immature Granulocytes: 0 %
Lymphocytes Relative: 37 %
Lymphs Abs: 2.1 10*3/uL (ref 0.7–4.0)
MCH: 32.8 pg (ref 26.0–34.0)
MCHC: 34.7 g/dL (ref 30.0–36.0)
MCV: 94.6 fL (ref 80.0–100.0)
Monocytes Absolute: 0.4 10*3/uL (ref 0.1–1.0)
Monocytes Relative: 7 %
Neutro Abs: 1.7 10*3/uL (ref 1.7–7.7)
Neutrophils Relative %: 31 %
Platelets: 232 10*3/uL (ref 150–400)
RBC: 3.87 MIL/uL — ABNORMAL LOW (ref 4.22–5.81)
RDW: 12.7 % (ref 11.5–15.5)
WBC: 5.7 10*3/uL (ref 4.0–10.5)
nRBC: 0 % (ref 0.0–0.2)

## 2018-09-25 LAB — COMPREHENSIVE METABOLIC PANEL
ALT: 41 U/L (ref 0–44)
AST: 59 U/L — ABNORMAL HIGH (ref 15–41)
Albumin: 4.2 g/dL (ref 3.5–5.0)
Alkaline Phosphatase: 70 U/L (ref 38–126)
Anion gap: 12 (ref 5–15)
BUN: 5 mg/dL — ABNORMAL LOW (ref 8–23)
CO2: 23 mmol/L (ref 22–32)
Calcium: 8.6 mg/dL — ABNORMAL LOW (ref 8.9–10.3)
Chloride: 97 mmol/L — ABNORMAL LOW (ref 98–111)
Creatinine, Ser: 0.63 mg/dL (ref 0.61–1.24)
GFR calc Af Amer: >60 mL/min (ref >60)
GFR calc non Af Amer: >60 mL/min (ref >60)
Glucose, Bld: 93 mg/dL (ref 70–99)
Potassium: 3.6 mmol/L (ref 3.5–5.1)
Sodium: 132 mmol/L — ABNORMAL LOW (ref 135–145)
Total Bilirubin: 0.5 mg/dL (ref 0.3–1.2)
Total Protein: 8 g/dL (ref 6.5–8.1)

## 2018-09-25 LAB — RAPID URINE DRUG SCREEN, HOSP PERFORMED
Amphetamines: NOT DETECTED
Barbiturates: NOT DETECTED
Benzodiazepines: NOT DETECTED
Cocaine: NOT DETECTED
Opiates: NOT DETECTED
Tetrahydrocannabinol: NOT DETECTED

## 2018-09-25 LAB — ETHANOL: Alcohol, Ethyl (B): 259 mg/dL — ABNORMAL HIGH (ref <10)

## 2018-09-25 NOTE — ED Notes (Signed)
Pt dressed out in burgundy colored scrubs and yellow socks, pts belongings labled and placed in a patient belongings bag.

## 2018-09-25 NOTE — ED Triage Notes (Signed)
Arrived by EMS; patient picked up from [facility] in [facility]. Patient reports he wants to detox from alcohol, he is having suicidal thought and he says he has not eaten in a week.

## 2018-09-25 NOTE — ED Notes (Signed)
Pt A&O x 3, no distress noted, presents with SI, no specific plan and requesting Detox for alcohol abuse.  Pt reports he drinks everything, everyday.  Denies HI or AVH.  Calm & cooperative, irritable and guarded behavior.  Monitoring for safety.  Sitter at bedside.
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
## 2018-09-26 DIAGNOSIS — F1014 Alcohol abuse with alcohol-induced mood disorder: Secondary | ICD-10-CM

## 2018-09-26 MED ORDER — GABAPENTIN 300 MG PO CAPS
300.0000 mg | ORAL_CAPSULE | Freq: Two times a day (BID) | ORAL | Status: DC
  Administered 2018-09-26: 300 mg via ORAL
  Filled 2018-09-26: qty 1

## 2018-09-26 NOTE — BH Assessment (Signed)
[facility] Assessment Progress Note    Per Jinny Blossom, FNP, patient does not meet admission criteria, but would benefit from a peer support consult to assist patient with possible alcohol detox services.
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
## 2018-09-26 NOTE — Progress Notes (Signed)
CSW received consult for patient to assist with transportation. CSW spoke with patient's RN Beth who informed CSW that patient wanted to get back to the [place] on [address]. CSW met with patient at bedside to determine his plan for discharge. Patient reports that he is homeless and "lives" at the [place] on [address] 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 Patient reports that he will be safe once he gets back to his stuff that is located at the store. Patient pleasant and cooperative, agreeable to discharge via cab. CSW arranged for transportation via cab at 12:45pm today. Beth, RN aware of plan.  Madilyn Fireman, MSW, LCSW-A Clinical Social Worker [facility] [phone]
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
## 2018-09-26 NOTE — ED Notes (Signed)
Pt off unit to home per MD order. Pt alert, calm, cooperative, no s/s of distress. Pt DC information given to and reviewed with acknowledged understanding.Belongings given to pt. Pt ambulatory off unit, escorted off unit with RN. Pt transported by cab with cab voucher.

## 2018-10-13 ENCOUNTER — Encounter (HOSPITAL_COMMUNITY): Payer: Self-pay

## 2018-10-13 ENCOUNTER — Other Ambulatory Visit: Payer: Self-pay

## 2018-10-13 ENCOUNTER — Emergency Department (HOSPITAL_COMMUNITY)
Admission: EM | Admit: 2018-10-13 | Discharge: 2018-10-13 | Disposition: A | Discharge status: Home / Self Care | Payer: Self-pay | Attending: Emergency Medicine | Admitting: Emergency Medicine

## 2018-10-13 DIAGNOSIS — Z79899 Other long term (current) drug therapy: Secondary | ICD-10-CM | POA: Insufficient documentation

## 2018-10-13 DIAGNOSIS — R569 Unspecified convulsions: Secondary | ICD-10-CM | POA: Insufficient documentation

## 2018-10-13 DIAGNOSIS — Z5900 Homelessness unspecified: Secondary | ICD-10-CM

## 2018-10-13 DIAGNOSIS — R197 Diarrhea, unspecified: Secondary | ICD-10-CM | POA: Insufficient documentation

## 2018-10-13 DIAGNOSIS — F1010 Alcohol abuse, uncomplicated: Secondary | ICD-10-CM | POA: Insufficient documentation

## 2018-10-13 DIAGNOSIS — F17210 Nicotine dependence, cigarettes, uncomplicated: Secondary | ICD-10-CM | POA: Insufficient documentation

## 2018-10-13 DIAGNOSIS — R112 Nausea with vomiting, unspecified: Secondary | ICD-10-CM | POA: Insufficient documentation

## 2018-10-13 LAB — RAPID URINE DRUG SCREEN, HOSP PERFORMED
Amphetamines: NOT DETECTED
Barbiturates: NOT DETECTED
Benzodiazepines: NOT DETECTED
Cocaine: NOT DETECTED
Opiates: NOT DETECTED
Tetrahydrocannabinol: NOT DETECTED

## 2018-10-13 LAB — COMPREHENSIVE METABOLIC PANEL
ALT: 57 U/L — ABNORMAL HIGH (ref 0–44)
AST: 81 U/L — ABNORMAL HIGH (ref 15–41)
Albumin: 4.1 g/dL (ref 3.5–5.0)
Alkaline Phosphatase: 68 U/L (ref 38–126)
Anion gap: 14 (ref 5–15)
BUN: <5 mg/dL — ABNORMAL LOW (ref 8–23)
CO2: 23 mmol/L (ref 22–32)
Calcium: 9.5 mg/dL (ref 8.9–10.3)
Chloride: 93 mmol/L — ABNORMAL LOW (ref 98–111)
Creatinine, Ser: 0.79 mg/dL (ref 0.61–1.24)
GFR calc Af Amer: >60 mL/min (ref >60)
GFR calc non Af Amer: >60 mL/min (ref >60)
Glucose, Bld: 86 mg/dL (ref 70–99)
Potassium: 4 mmol/L (ref 3.5–5.1)
Sodium: 130 mmol/L — ABNORMAL LOW (ref 135–145)
Total Bilirubin: 0.9 mg/dL (ref 0.3–1.2)
Total Protein: 8.2 g/dL — ABNORMAL HIGH (ref 6.5–8.1)

## 2018-10-13 LAB — URINALYSIS, ROUTINE W REFLEX MICROSCOPIC
Bilirubin Urine: NEGATIVE
Glucose, UA: NEGATIVE mg/dL
Hgb urine dipstick: NEGATIVE
Ketones, ur: 5 mg/dL — AB
Leukocytes,Ua: NEGATIVE
Nitrite: NEGATIVE
Protein, ur: NEGATIVE mg/dL
Specific Gravity, Urine: 1.015 (ref 1.005–1.030)
pH: 6 (ref 5.0–8.0)

## 2018-10-13 LAB — LIPASE, BLOOD: Lipase: 42 U/L (ref 11–51)

## 2018-10-13 LAB — CBC
HCT: 37.5 % — ABNORMAL LOW (ref 39.0–52.0)
Hemoglobin: 13 g/dL (ref 13.0–17.0)
MCH: 32.8 pg (ref 26.0–34.0)
MCHC: 34.7 g/dL (ref 30.0–36.0)
MCV: 94.7 fL (ref 80.0–100.0)
Platelets: 284 10*3/uL (ref 150–400)
RBC: 3.96 MIL/uL — ABNORMAL LOW (ref 4.22–5.81)
RDW: 12.8 % (ref 11.5–15.5)
WBC: 6.9 10*3/uL (ref 4.0–10.5)
nRBC: 0 % (ref 0.0–0.2)

## 2018-10-13 MED ORDER — LOPERAMIDE HCL 2 MG PO CAPS: 2.0000 mg | ORAL_CAPSULE | Freq: Four times a day (QID) | ORAL | 2 refills | Status: DC | PRN

## 2018-10-13 MED ORDER — SODIUM CHLORIDE 0.9 % IV BOLUS
1000.0000 mL | Freq: Once | INTRAVENOUS | Status: AC
  Administered 2018-10-13: 1000 mL via INTRAVENOUS

## 2018-10-13 MED ORDER — FAMOTIDINE 20 MG PO TABS: 20.0000 mg | ORAL_TABLET | Freq: Two times a day (BID) | ORAL | 0 refills | Status: DC

## 2018-10-13 MED ORDER — SODIUM CHLORIDE 0.9% FLUSH: 3.0000 mL | Freq: Once | INTRAVENOUS | Status: DC

## 2018-10-13 MED ORDER — ONDANSETRON 4 MG PO TBDP: 4.0000 mg | ORAL_TABLET | ORAL | 0 refills | Status: DC | PRN

## 2018-10-13 MED ORDER — PANTOPRAZOLE SODIUM 40 MG IV SOLR
40.0000 mg | Freq: Once | INTRAVENOUS | Status: AC
  Administered 2018-10-13: 40 mg via INTRAVENOUS
  Filled 2018-10-13: qty 40

## 2018-10-13 MED ORDER — ONDANSETRON HCL 4 MG/2ML IJ SOLN
4.0000 mg | Freq: Once | INTRAMUSCULAR | Status: AC
  Administered 2018-10-13: 4 mg via INTRAVENOUS
  Filled 2018-10-13: qty 2
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
## 2018-10-13 NOTE — ED Triage Notes (Signed)
To triage via EMS.  Onset 3 days N/V/D and abd pain.  Vomiting 5 times day, frequent diarrhea.  Pt normally drinks a case beer per day and has been able to drink same.  Is not able to tolerate foods.

## 2018-10-13 NOTE — ED Notes (Signed)
Pt verbalized understanding of d/c instructions and has no further questions, VSS, NAD.  

## 2018-10-19 ENCOUNTER — Encounter (HOSPITAL_COMMUNITY): Payer: Self-pay | Admitting: Emergency Medicine

## 2018-10-19 ENCOUNTER — Emergency Department (HOSPITAL_COMMUNITY)
Admission: EM | Admit: 2018-10-19 | Discharge: 2018-10-19 | Disposition: A | Discharge status: Home / Self Care | Payer: Self-pay | Attending: Emergency Medicine | Admitting: Emergency Medicine

## 2018-10-19 DIAGNOSIS — F1010 Alcohol abuse, uncomplicated: Secondary | ICD-10-CM | POA: Insufficient documentation

## 2018-10-19 DIAGNOSIS — Z79899 Other long term (current) drug therapy: Secondary | ICD-10-CM | POA: Insufficient documentation

## 2018-10-19 DIAGNOSIS — K2920 Alcoholic gastritis without bleeding: Secondary | ICD-10-CM | POA: Insufficient documentation

## 2018-10-19 DIAGNOSIS — F17210 Nicotine dependence, cigarettes, uncomplicated: Secondary | ICD-10-CM | POA: Insufficient documentation

## 2018-10-19 LAB — COMPREHENSIVE METABOLIC PANEL
ALT: 37 U/L (ref 0–44)
AST: 50 U/L — ABNORMAL HIGH (ref 15–41)
Albumin: 3.9 g/dL (ref 3.5–5.0)
Alkaline Phosphatase: 62 U/L (ref 38–126)
Anion gap: 14 (ref 5–15)
BUN: <5 mg/dL — ABNORMAL LOW (ref 8–23)
CO2: 25 mmol/L (ref 22–32)
Calcium: 9.5 mg/dL (ref 8.9–10.3)
Chloride: 96 mmol/L — ABNORMAL LOW (ref 98–111)
Creatinine, Ser: 0.85 mg/dL (ref 0.61–1.24)
GFR calc Af Amer: >60 mL/min (ref >60)
GFR calc non Af Amer: >60 mL/min (ref >60)
Glucose, Bld: 88 mg/dL (ref 70–99)
Potassium: 3.4 mmol/L — ABNORMAL LOW (ref 3.5–5.1)
Sodium: 135 mmol/L (ref 135–145)
Total Bilirubin: 0.9 mg/dL (ref 0.3–1.2)
Total Protein: 7.6 g/dL (ref 6.5–8.1)

## 2018-10-19 LAB — CBC
HCT: 37.7 % — ABNORMAL LOW (ref 39.0–52.0)
Hemoglobin: 12.7 g/dL — ABNORMAL LOW (ref 13.0–17.0)
MCH: 32.7 pg (ref 26.0–34.0)
MCHC: 33.7 g/dL (ref 30.0–36.0)
MCV: 97.2 fL (ref 80.0–100.0)
Platelets: 287 10*3/uL (ref 150–400)
RBC: 3.88 MIL/uL — ABNORMAL LOW (ref 4.22–5.81)
RDW: 13.2 % (ref 11.5–15.5)
WBC: 6.2 10*3/uL (ref 4.0–10.5)
nRBC: 0 % (ref 0.0–0.2)

## 2018-10-19 LAB — LIPASE, BLOOD: Lipase: 36 U/L (ref 11–51)

## 2018-10-19 MED ORDER — ONDANSETRON 4 MG PO TBDP
8.0000 mg | ORAL_TABLET | Freq: Once | ORAL | Status: AC
  Administered 2018-10-19: 8 mg via ORAL
  Filled 2018-10-19: qty 2

## 2018-10-19 MED ORDER — FAMOTIDINE 20 MG PO TABS
20.0000 mg | ORAL_TABLET | Freq: Once | ORAL | Status: AC
  Administered 2018-10-19: 20 mg via ORAL
  Filled 2018-10-19: qty 1

## 2018-10-19 MED ORDER — SODIUM CHLORIDE 0.9% FLUSH: 3.0000 mL | Freq: Once | INTRAVENOUS | Status: DC

## 2018-10-19 MED ORDER — HYDROCODONE-ACETAMINOPHEN 5-325 MG PO TABS
1.0000 | ORAL_TABLET | Freq: Once | ORAL | Status: AC
  Administered 2018-10-19: 1 via ORAL
  Filled 2018-10-19: qty 1

## 2018-10-19 MED ORDER — ALUM & MAG HYDROXIDE-SIMETH 200-200-20 MG/5ML PO SUSP
30.0000 mL | Freq: Once | ORAL | Status: AC
  Administered 2018-10-19: 30 mL via ORAL
  Filled 2018-10-19: qty 30

## 2018-10-19 MED ORDER — PANTOPRAZOLE SODIUM 40 MG PO TBEC: 40.0000 mg | DELAYED_RELEASE_TABLET | Freq: Every day | ORAL | 0 refills | Status: DC

## 2018-10-19 NOTE — ED Triage Notes (Signed)
Pt arrives by [facility] after [facility] after pt reports drinking 12 beers today. Pt arrives alert and ox4- pt states he drank a 24 pack today and then felt "very sick'. Pt states he has generalized abd pain with emesis. Pt states he is currently lives on the streets and drinks at least a 12 pack each day.
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
## 2018-10-19 NOTE — ED Notes (Signed)
Patient verbalizes understanding of discharge instructions. Opportunity for questioning and answers were provided. Armband removed by staff, pt discharged from ED.  

## 2018-10-19 NOTE — ED Notes (Signed)
Pt made aware of the need for urine.

## 2018-10-19 NOTE — Discharge Instructions (Addendum)
It was our pleasure to provide your ER care today - we hope that you feel better.  Avoid any alcohol use - use resource guide provided for alcohol treatment options.   Take protonix (acid blocker medication). You may also try pepcid or maalox as need for symptom relief.  Follow up with primary care doctor in the next 1-2 weeks.  Return to ER if worse, new symptoms, fevers, severe or worsening pain, persistent vomiting, other concern.

## 2018-10-31 ENCOUNTER — Emergency Department (HOSPITAL_BASED_OUTPATIENT_CLINIC_OR_DEPARTMENT_OTHER)
Admission: EM | Admit: 2018-10-31 | Discharge: 2018-11-01 | Disposition: A | Discharge status: Home / Self Care | Payer: Self-pay | Attending: Emergency Medicine | Admitting: Emergency Medicine

## 2018-10-31 ENCOUNTER — Other Ambulatory Visit: Payer: Self-pay

## 2018-10-31 ENCOUNTER — Encounter (HOSPITAL_BASED_OUTPATIENT_CLINIC_OR_DEPARTMENT_OTHER): Payer: Self-pay | Admitting: Emergency Medicine

## 2018-10-31 DIAGNOSIS — F1010 Alcohol abuse, uncomplicated: Secondary | ICD-10-CM | POA: Insufficient documentation

## 2018-10-31 DIAGNOSIS — R1084 Generalized abdominal pain: Secondary | ICD-10-CM | POA: Insufficient documentation

## 2018-10-31 DIAGNOSIS — E876 Hypokalemia: Secondary | ICD-10-CM | POA: Insufficient documentation

## 2018-10-31 DIAGNOSIS — R112 Nausea with vomiting, unspecified: Secondary | ICD-10-CM | POA: Insufficient documentation

## 2018-10-31 DIAGNOSIS — Z590 Homelessness: Secondary | ICD-10-CM | POA: Insufficient documentation

## 2018-10-31 DIAGNOSIS — F17210 Nicotine dependence, cigarettes, uncomplicated: Secondary | ICD-10-CM | POA: Insufficient documentation

## 2018-10-31 DIAGNOSIS — Z79899 Other long term (current) drug therapy: Secondary | ICD-10-CM | POA: Insufficient documentation

## 2018-10-31 LAB — CBC WITH DIFFERENTIAL/PLATELET
Abs Immature Granulocytes: 0.02 10*3/uL (ref 0.00–0.07)
Basophils Absolute: 0.1 10*3/uL (ref 0.0–0.1)
Basophils Relative: 2 %
Eosinophils Absolute: 0.8 10*3/uL — ABNORMAL HIGH (ref 0.0–0.5)
Eosinophils Relative: 15 %
HCT: 33.3 % — ABNORMAL LOW (ref 39.0–52.0)
Hemoglobin: 11.5 g/dL — ABNORMAL LOW (ref 13.0–17.0)
Immature Granulocytes: 0 %
Lymphocytes Relative: 33 %
Lymphs Abs: 1.9 10*3/uL (ref 0.7–4.0)
MCH: 33.2 pg (ref 26.0–34.0)
MCHC: 34.5 g/dL (ref 30.0–36.0)
MCV: 96.2 fL (ref 80.0–100.0)
Monocytes Absolute: 0.5 10*3/uL (ref 0.1–1.0)
Monocytes Relative: 9 %
Neutro Abs: 2.3 10*3/uL (ref 1.7–7.7)
Neutrophils Relative %: 41 %
Platelets: 273 10*3/uL (ref 150–400)
RBC: 3.46 MIL/uL — ABNORMAL LOW (ref 4.22–5.81)
RDW: 12.6 % (ref 11.5–15.5)
WBC: 5.6 10*3/uL (ref 4.0–10.5)
nRBC: 0 % (ref 0.0–0.2)

## 2018-10-31 LAB — COMPREHENSIVE METABOLIC PANEL
ALT: 19 U/L (ref 0–44)
AST: 29 U/L (ref 15–41)
Albumin: 3.7 g/dL (ref 3.5–5.0)
Alkaline Phosphatase: 54 U/L (ref 38–126)
Anion gap: 13 (ref 5–15)
BUN: <5 mg/dL — ABNORMAL LOW (ref 8–23)
CO2: 22 mmol/L (ref 22–32)
Calcium: 8.9 mg/dL (ref 8.9–10.3)
Chloride: 94 mmol/L — ABNORMAL LOW (ref 98–111)
Creatinine, Ser: 0.61 mg/dL (ref 0.61–1.24)
GFR calc Af Amer: >60 mL/min (ref >60)
GFR calc non Af Amer: >60 mL/min (ref >60)
Glucose, Bld: 94 mg/dL (ref 70–99)
Potassium: 3.1 mmol/L — ABNORMAL LOW (ref 3.5–5.1)
Sodium: 129 mmol/L — ABNORMAL LOW (ref 135–145)
Total Bilirubin: 0.4 mg/dL (ref 0.3–1.2)
Total Protein: 7 g/dL (ref 6.5–8.1)

## 2018-10-31 LAB — LIPASE, BLOOD: Lipase: 43 U/L (ref 11–51)

## 2018-10-31 MED ORDER — ONDANSETRON HCL 4 MG/2ML IJ SOLN
4.0000 mg | Freq: Once | INTRAMUSCULAR | Status: AC
  Administered 2018-10-31: 23:00:00 4 mg via INTRAVENOUS
  Filled 2018-10-31: qty 2

## 2018-10-31 MED ORDER — SODIUM CHLORIDE 0.9 % IV BOLUS
500.0000 mL | Freq: Once | INTRAVENOUS | Status: AC
  Administered 2018-10-31: 23:00:00 500 mL via INTRAVENOUS

## 2018-10-31 MED ORDER — POTASSIUM CHLORIDE CRYS ER 20 MEQ PO TBCR
20.0000 meq | EXTENDED_RELEASE_TABLET | Freq: Once | ORAL | Status: AC
  Administered 2018-10-31: 20 meq via ORAL
  Filled 2018-10-31: qty 1
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
## 2018-10-31 NOTE — ED Notes (Signed)
Received report

## 2018-10-31 NOTE — ED Triage Notes (Signed)
Brought by ems for c/o central abdominal pain and n/v that started today.

## 2018-10-31 NOTE — ED Notes (Signed)
ED Provider at bedside. 

## 2018-10-31 NOTE — Discharge Instructions (Signed)
You were seen in the emergency department for nausea vomiting and abdominal pain.  You had test here that did not show any serious findings.  Your continued drinking may have something to do with your abdominal pain and you should consider detox.

## 2018-10-31 NOTE — ED Notes (Addendum)
Update given to patient about plan of care. Pt mumbling under his breath about IV, when asked to speak up he states "nothing." PO fluids given.

## 2018-11-01 MED ORDER — SUCRALFATE 1 G PO TABS: 1.0000 g | ORAL_TABLET | Freq: Three times a day (TID) | ORAL | 0 refills | Status: DC

## 2018-11-01 NOTE — ED Notes (Signed)
Provided D/C paperwork and encouraged decreasing alcohol consumption. Homeless, provided meal and cab voucher. Wants to go to [facility] on [address]. Registration notifed to call cab. Pt eating frozen meal at this time. Refused discharge vitals.

## 2018-11-01 NOTE — ED Notes (Signed)
Passed PO challenge

## 2018-11-05 ENCOUNTER — Other Ambulatory Visit: Payer: Self-pay

## 2018-11-05 ENCOUNTER — Emergency Department (HOSPITAL_COMMUNITY)
Admission: EM | Admit: 2018-11-05 | Discharge: 2018-11-06 | Disposition: A | Discharge status: Home / Self Care | Payer: Self-pay | Attending: Emergency Medicine | Admitting: Emergency Medicine

## 2018-11-05 ENCOUNTER — Encounter (HOSPITAL_COMMUNITY): Payer: Self-pay

## 2018-11-05 DIAGNOSIS — R112 Nausea with vomiting, unspecified: Secondary | ICD-10-CM

## 2018-11-05 DIAGNOSIS — Z590 Homelessness: Secondary | ICD-10-CM | POA: Insufficient documentation

## 2018-11-05 DIAGNOSIS — K0889 Other specified disorders of teeth and supporting structures: Secondary | ICD-10-CM | POA: Insufficient documentation

## 2018-11-05 DIAGNOSIS — F1010 Alcohol abuse, uncomplicated: Secondary | ICD-10-CM | POA: Insufficient documentation

## 2018-11-05 DIAGNOSIS — Z79899 Other long term (current) drug therapy: Secondary | ICD-10-CM | POA: Insufficient documentation

## 2018-11-05 DIAGNOSIS — F17210 Nicotine dependence, cigarettes, uncomplicated: Secondary | ICD-10-CM | POA: Insufficient documentation

## 2018-11-05 DIAGNOSIS — K2920 Alcoholic gastritis without bleeding: Secondary | ICD-10-CM | POA: Insufficient documentation

## 2018-11-05 LAB — URINALYSIS, ROUTINE W REFLEX MICROSCOPIC
Bilirubin Urine: NEGATIVE
Glucose, UA: NEGATIVE mg/dL
Hgb urine dipstick: NEGATIVE
Ketones, ur: 5 mg/dL — AB
Leukocytes,Ua: NEGATIVE
Nitrite: NEGATIVE
Protein, ur: 30 mg/dL — AB
Specific Gravity, Urine: 1.027 (ref 1.005–1.030)
pH: 6 (ref 5.0–8.0)

## 2018-11-05 LAB — COMPREHENSIVE METABOLIC PANEL
ALT: 21 U/L (ref 0–44)
AST: 32 U/L (ref 15–41)
Albumin: 3.8 g/dL (ref 3.5–5.0)
Alkaline Phosphatase: 56 U/L (ref 38–126)
Anion gap: 12 (ref 5–15)
BUN: 10 mg/dL (ref 8–23)
CO2: 26 mmol/L (ref 22–32)
Calcium: 10.1 mg/dL (ref 8.9–10.3)
Chloride: 94 mmol/L — ABNORMAL LOW (ref 98–111)
Creatinine, Ser: 0.89 mg/dL (ref 0.61–1.24)
GFR calc Af Amer: >60 mL/min (ref >60)
GFR calc non Af Amer: >60 mL/min (ref >60)
Glucose, Bld: 90 mg/dL (ref 70–99)
Potassium: 3.4 mmol/L — ABNORMAL LOW (ref 3.5–5.1)
Sodium: 132 mmol/L — ABNORMAL LOW (ref 135–145)
Total Bilirubin: 0.7 mg/dL (ref 0.3–1.2)
Total Protein: 7.6 g/dL (ref 6.5–8.1)

## 2018-11-05 LAB — CBC
HCT: 36.5 % — ABNORMAL LOW (ref 39.0–52.0)
Hemoglobin: 12.3 g/dL — ABNORMAL LOW (ref 13.0–17.0)
MCH: 33.2 pg (ref 26.0–34.0)
MCHC: 33.7 g/dL (ref 30.0–36.0)
MCV: 98.4 fL (ref 80.0–100.0)
Platelets: 317 10*3/uL (ref 150–400)
RBC: 3.71 MIL/uL — ABNORMAL LOW (ref 4.22–5.81)
RDW: 12.4 % (ref 11.5–15.5)
WBC: 6.2 10*3/uL (ref 4.0–10.5)
nRBC: 0 % (ref 0.0–0.2)

## 2018-11-05 LAB — LIPASE, BLOOD: Lipase: 65 U/L — ABNORMAL HIGH (ref 11–51)

## 2018-11-05 MED ORDER — SODIUM CHLORIDE 0.9% FLUSH: 3.0000 mL | Freq: Once | INTRAVENOUS | Status: DC

## 2018-11-05 NOTE — ED Triage Notes (Signed)
Pt arrives with PTAR for abd pain and emesis for 3 days, seen at [facility] for the same. Hx of alcohol abuse.

## 2018-11-06 MED ORDER — PANTOPRAZOLE SODIUM 40 MG IV SOLR
40.0000 mg | Freq: Once | INTRAVENOUS | Status: AC
  Administered 2018-11-06: 02:00:00 40 mg via INTRAVENOUS
  Filled 2018-11-06: qty 40

## 2018-11-06 MED ORDER — ACETAMINOPHEN 500 MG PO TABS
1000.0000 mg | ORAL_TABLET | Freq: Once | ORAL | Status: AC
  Administered 2018-11-06: 1000 mg via ORAL
  Filled 2018-11-06: qty 2

## 2018-11-06 MED ORDER — ONDANSETRON HCL 4 MG/2ML IJ SOLN
4.0000 mg | Freq: Once | INTRAMUSCULAR | Status: AC
  Administered 2018-11-06: 02:00:00 4 mg via INTRAVENOUS
  Filled 2018-11-06: qty 2

## 2018-11-06 MED ORDER — SODIUM CHLORIDE 0.9 % IV BOLUS
1000.0000 mL | Freq: Once | INTRAVENOUS | Status: AC
  Administered 2018-11-06: 1000 mL via INTRAVENOUS
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
## 2019-01-24 ENCOUNTER — Emergency Department (HOSPITAL_COMMUNITY)
Admission: EM | Admit: 2019-01-24 | Discharge: 2019-01-24 | Disposition: A | Discharge status: Home / Self Care | Payer: Self-pay | Attending: Emergency Medicine | Admitting: Emergency Medicine

## 2019-01-24 ENCOUNTER — Emergency Department (HOSPITAL_COMMUNITY): Payer: Self-pay

## 2019-01-24 ENCOUNTER — Encounter (HOSPITAL_COMMUNITY): Payer: Self-pay

## 2019-01-24 ENCOUNTER — Other Ambulatory Visit: Payer: Self-pay

## 2019-01-24 DIAGNOSIS — F17210 Nicotine dependence, cigarettes, uncomplicated: Secondary | ICD-10-CM | POA: Insufficient documentation

## 2019-01-24 DIAGNOSIS — F1410 Cocaine abuse, uncomplicated: Secondary | ICD-10-CM | POA: Insufficient documentation

## 2019-01-24 DIAGNOSIS — T50901A Poisoning by unspecified drugs, medicaments and biological substances, accidental (unintentional), initial encounter: Secondary | ICD-10-CM | POA: Insufficient documentation

## 2019-01-24 LAB — CBC WITH DIFFERENTIAL/PLATELET
Abs Immature Granulocytes: 0.02 10*3/uL (ref 0.00–0.07)
Basophils Absolute: 0.1 10*3/uL (ref 0.0–0.1)
Basophils Relative: 2 %
Eosinophils Absolute: 1.4 10*3/uL — ABNORMAL HIGH (ref 0.0–0.5)
Eosinophils Relative: 24 %
HCT: 38.1 % — ABNORMAL LOW (ref 39.0–52.0)
Hemoglobin: 12.7 g/dL — ABNORMAL LOW (ref 13.0–17.0)
Immature Granulocytes: 0 %
Lymphocytes Relative: 24 %
Lymphs Abs: 1.4 10*3/uL (ref 0.7–4.0)
MCH: 32.6 pg (ref 26.0–34.0)
MCHC: 33.3 g/dL (ref 30.0–36.0)
MCV: 97.9 fL (ref 80.0–100.0)
Monocytes Absolute: 0.5 10*3/uL (ref 0.1–1.0)
Monocytes Relative: 8 %
Neutro Abs: 2.4 10*3/uL (ref 1.7–7.7)
Neutrophils Relative %: 42 %
Platelets: 213 10*3/uL (ref 150–400)
RBC: 3.89 MIL/uL — ABNORMAL LOW (ref 4.22–5.81)
RDW: 12.1 % (ref 11.5–15.5)
WBC: 5.7 10*3/uL (ref 4.0–10.5)
nRBC: 0 % (ref 0.0–0.2)

## 2019-01-24 LAB — RAPID URINE DRUG SCREEN, HOSP PERFORMED
Amphetamines: NOT DETECTED
Barbiturates: NOT DETECTED
Benzodiazepines: NOT DETECTED
Cocaine: POSITIVE — AB
Opiates: NOT DETECTED
Tetrahydrocannabinol: NOT DETECTED

## 2019-01-24 LAB — ETHANOL: Alcohol, Ethyl (B): 124 mg/dL — ABNORMAL HIGH (ref <10)

## 2019-01-24 LAB — COMPREHENSIVE METABOLIC PANEL
ALT: 45 U/L — ABNORMAL HIGH (ref 0–44)
AST: 75 U/L — ABNORMAL HIGH (ref 15–41)
Albumin: 3.5 g/dL (ref 3.5–5.0)
Alkaline Phosphatase: 61 U/L (ref 38–126)
Anion gap: 10 (ref 5–15)
BUN: 7 mg/dL — ABNORMAL LOW (ref 8–23)
CO2: 21 mmol/L — ABNORMAL LOW (ref 22–32)
Calcium: 8.1 mg/dL — ABNORMAL LOW (ref 8.9–10.3)
Chloride: 101 mmol/L (ref 98–111)
Creatinine, Ser: 0.58 mg/dL — ABNORMAL LOW (ref 0.61–1.24)
GFR calc Af Amer: >60 mL/min (ref >60)
GFR calc non Af Amer: >60 mL/min (ref >60)
Glucose, Bld: 88 mg/dL (ref 70–99)
Potassium: 3.6 mmol/L (ref 3.5–5.1)
Sodium: 132 mmol/L — ABNORMAL LOW (ref 135–145)
Total Bilirubin: 0.3 mg/dL (ref 0.3–1.2)
Total Protein: 7.1 g/dL (ref 6.5–8.1)
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
## 2019-01-24 NOTE — ED Triage Notes (Signed)
Pt found unresponsive at hotel with friends, who began cpr. Pulses returned. Upon arrival ems states patient was still not responsive to verbal stimuli, given 1mg  narcan intranasal. Pt alert and oriented upon arrival.

## 2019-01-24 NOTE — Discharge Instructions (Addendum)
Stop using drugs and alcohol follow-up with [facility]

## 2019-01-27 LAB — DRUG SCREEN 10 W/CONF, SERUM
Amphetamines, IA: NEGATIVE ng/mL
Barbiturates, IA: NEGATIVE ug/mL
Benzodiazepines, IA: NEGATIVE ng/mL
Cocaine & Metabolite, IA: NEGATIVE ng/mL
Methadone, IA: NEGATIVE ng/mL
Opiates, IA: NEGATIVE ng/mL
Oxycodones, IA: NEGATIVE ng/mL
Phencyclidine, IA: NEGATIVE ng/mL
Propoxyphene, IA: NEGATIVE ng/mL
THC(Marijuana) Metabolite, IA: NEGATIVE ng/mL

## 2019-03-28 ENCOUNTER — Emergency Department (HOSPITAL_BASED_OUTPATIENT_CLINIC_OR_DEPARTMENT_OTHER): Admission: EM | Admit: 2019-03-28 | Discharge: 2019-03-28 | Discharge status: Against Medical Advice | Payer: Self-pay

## 2019-03-28 ENCOUNTER — Encounter (HOSPITAL_BASED_OUTPATIENT_CLINIC_OR_DEPARTMENT_OTHER): Payer: Self-pay | Admitting: Emergency Medicine

## 2019-03-28 ENCOUNTER — Other Ambulatory Visit: Payer: Self-pay

## 2019-03-28 NOTE — ED Triage Notes (Signed)
Pt is brought in via EMS  - he states that he wants a GI cocktail and IV fluid  - he is homeless. He is unable to get any meds filled for his GI reflux

## 2019-04-02 ENCOUNTER — Encounter (HOSPITAL_COMMUNITY): Payer: Self-pay | Admitting: Emergency Medicine

## 2019-04-02 ENCOUNTER — Other Ambulatory Visit: Payer: Self-pay

## 2019-04-02 DIAGNOSIS — Z79899 Other long term (current) drug therapy: Secondary | ICD-10-CM | POA: Insufficient documentation

## 2019-04-02 DIAGNOSIS — F17210 Nicotine dependence, cigarettes, uncomplicated: Secondary | ICD-10-CM | POA: Insufficient documentation

## 2019-04-02 DIAGNOSIS — E876 Hypokalemia: Secondary | ICD-10-CM | POA: Insufficient documentation

## 2019-04-02 DIAGNOSIS — K2920 Alcoholic gastritis without bleeding: Secondary | ICD-10-CM | POA: Insufficient documentation

## 2019-04-02 DIAGNOSIS — G8929 Other chronic pain: Secondary | ICD-10-CM | POA: Insufficient documentation

## 2019-04-02 LAB — COMPREHENSIVE METABOLIC PANEL
ALT: 16 U/L (ref 0–44)
AST: 23 U/L (ref 15–41)
Albumin: 4.1 g/dL (ref 3.5–5.0)
Alkaline Phosphatase: 55 U/L (ref 38–126)
Anion gap: 10 (ref 5–15)
BUN: 8 mg/dL (ref 8–23)
CO2: 29 mmol/L (ref 22–32)
Calcium: 9.7 mg/dL (ref 8.9–10.3)
Chloride: 89 mmol/L — ABNORMAL LOW (ref 98–111)
Creatinine, Ser: 0.9 mg/dL (ref 0.61–1.24)
GFR calc Af Amer: >60 mL/min (ref >60)
GFR calc non Af Amer: >60 mL/min (ref >60)
Glucose, Bld: 98 mg/dL (ref 70–99)
Potassium: 2.9 mmol/L — ABNORMAL LOW (ref 3.5–5.1)
Sodium: 128 mmol/L — ABNORMAL LOW (ref 135–145)
Total Bilirubin: 0.5 mg/dL (ref 0.3–1.2)
Total Protein: 7.9 g/dL (ref 6.5–8.1)

## 2019-04-02 LAB — CBC
HCT: 39.1 % (ref 39.0–52.0)
Hemoglobin: 13.2 g/dL (ref 13.0–17.0)
MCH: 32.3 pg (ref 26.0–34.0)
MCHC: 33.8 g/dL (ref 30.0–36.0)
MCV: 95.6 fL (ref 80.0–100.0)
Platelets: 337 10*3/uL (ref 150–400)
RBC: 4.09 MIL/uL — ABNORMAL LOW (ref 4.22–5.81)
RDW: 11.9 % (ref 11.5–15.5)
WBC: 7 10*3/uL (ref 4.0–10.5)
nRBC: 0 % (ref 0.0–0.2)

## 2019-04-02 LAB — LIPASE, BLOOD: Lipase: 28 U/L (ref 11–51)

## 2019-04-02 MED ORDER — SODIUM CHLORIDE 0.9% FLUSH: 3.0000 mL | Freq: Once | INTRAVENOUS | Status: DC

## 2019-04-02 NOTE — ED Notes (Signed)
Labeled specimen cup given to pt for U/A collection per MD order. ENMiles 

## 2019-04-02 NOTE — ED Triage Notes (Signed)
Per [facility] pt comes from [place] on [address] for abd pains x week.  150/90, 99%, 90HR, 18R, 98.1

## 2019-04-03 ENCOUNTER — Emergency Department (HOSPITAL_COMMUNITY)
Admission: EM | Admit: 2019-04-03 | Discharge: 2019-04-03 | Disposition: A | Discharge status: Home / Self Care | Payer: Self-pay | Attending: Emergency Medicine | Admitting: Emergency Medicine

## 2019-04-03 DIAGNOSIS — K2920 Alcoholic gastritis without bleeding: Secondary | ICD-10-CM

## 2019-04-03 DIAGNOSIS — G8929 Other chronic pain: Secondary | ICD-10-CM

## 2019-04-03 DIAGNOSIS — E876 Hypokalemia: Secondary | ICD-10-CM

## 2019-04-03 LAB — URINALYSIS, ROUTINE W REFLEX MICROSCOPIC
Bilirubin Urine: NEGATIVE
Glucose, UA: NEGATIVE mg/dL
Hgb urine dipstick: NEGATIVE
Ketones, ur: NEGATIVE mg/dL
Leukocytes,Ua: NEGATIVE
Nitrite: NEGATIVE
Protein, ur: NEGATIVE mg/dL
Specific Gravity, Urine: 1.014 (ref 1.005–1.030)
pH: 6 (ref 5.0–8.0)

## 2019-04-03 MED ORDER — POTASSIUM CHLORIDE ER 20 MEQ PO TBCR: 20.0000 meq | EXTENDED_RELEASE_TABLET | Freq: Every day | ORAL | 0 refills | Status: DC

## 2019-04-03 MED ORDER — POTASSIUM CHLORIDE 10 MEQ/100ML IV SOLN
10.0000 meq | Freq: Once | INTRAVENOUS | Status: AC
  Administered 2019-04-03: 10 meq via INTRAVENOUS
  Filled 2019-04-03: qty 100

## 2019-04-03 MED ORDER — SUCRALFATE 1 G PO TABS: 1.0000 g | ORAL_TABLET | Freq: Three times a day (TID) | ORAL | 0 refills | Status: DC

## 2019-04-03 MED ORDER — SUCRALFATE 1 GM/10ML PO SUSP: 0.5000 g | Freq: Three times a day (TID) | ORAL | Status: DC

## 2019-04-03 MED ORDER — SODIUM CHLORIDE 0.9 % IV BOLUS
1000.0000 mL | Freq: Once | INTRAVENOUS | Status: AC
  Administered 2019-04-03: 1000 mL via INTRAVENOUS

## 2019-04-03 MED ORDER — PANTOPRAZOLE SODIUM 40 MG PO TBEC: 40.0000 mg | DELAYED_RELEASE_TABLET | Freq: Every day | ORAL | 0 refills | Status: DC

## 2019-04-03 MED ORDER — POTASSIUM CHLORIDE CRYS ER 20 MEQ PO TBCR
40.0000 meq | EXTENDED_RELEASE_TABLET | Freq: Once | ORAL | Status: AC
  Administered 2019-04-03: 40 meq via ORAL
  Filled 2019-04-03: qty 2

## 2019-04-03 MED ORDER — PANTOPRAZOLE SODIUM 40 MG PO TBEC
40.0000 mg | DELAYED_RELEASE_TABLET | Freq: Once | ORAL | Status: AC
  Administered 2019-04-03: 40 mg via ORAL
  Filled 2019-04-03: qty 1
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
## 2019-04-03 NOTE — Discharge Instructions (Addendum)
1. Medications: protonix, carafate, usual home medications 2. Treatment: rest, drink plenty of fluids, advance diet slowly 3. Follow Up: Please followup with your primary doctor in 2 days for discussion of your diagnoses and further evaluation after today's visit; if you do not have a primary care doctor use the resource guide provided to find one; Please return to the ER for persistent vomiting, high fevers or worsening symptoms

## 2020-05-21 ENCOUNTER — Emergency Department (HOSPITAL_BASED_OUTPATIENT_CLINIC_OR_DEPARTMENT_OTHER)
Admission: EM | Admit: 2020-05-21 | Discharge: 2020-05-21 | Disposition: A | Discharge status: Home / Self Care | Payer: Self-pay | Attending: Emergency Medicine | Admitting: Emergency Medicine

## 2020-05-21 ENCOUNTER — Other Ambulatory Visit: Payer: Self-pay

## 2020-05-21 ENCOUNTER — Encounter (HOSPITAL_BASED_OUTPATIENT_CLINIC_OR_DEPARTMENT_OTHER): Payer: Self-pay

## 2020-05-21 DIAGNOSIS — F17210 Nicotine dependence, cigarettes, uncomplicated: Secondary | ICD-10-CM | POA: Insufficient documentation

## 2020-05-21 DIAGNOSIS — R112 Nausea with vomiting, unspecified: Secondary | ICD-10-CM | POA: Insufficient documentation

## 2020-05-21 DIAGNOSIS — R0989 Other specified symptoms and signs involving the circulatory and respiratory systems: Secondary | ICD-10-CM | POA: Insufficient documentation

## 2020-05-21 LAB — RAPID URINE DRUG SCREEN, HOSP PERFORMED
Amphetamines: NOT DETECTED
Barbiturates: NOT DETECTED
Benzodiazepines: NOT DETECTED
Cocaine: POSITIVE — AB
Opiates: NOT DETECTED
Tetrahydrocannabinol: NOT DETECTED

## 2020-05-21 LAB — CBC WITH DIFFERENTIAL/PLATELET
Abs Immature Granulocytes: 0.02 10*3/uL (ref 0.00–0.07)
Basophils Absolute: 0.1 10*3/uL (ref 0.0–0.1)
Basophils Relative: 1 %
Eosinophils Absolute: 0.3 10*3/uL (ref 0.0–0.5)
Eosinophils Relative: 3 %
HCT: 38.3 % — ABNORMAL LOW (ref 39.0–52.0)
Hemoglobin: 13.6 g/dL (ref 13.0–17.0)
Immature Granulocytes: 0 %
Lymphocytes Relative: 11 %
Lymphs Abs: 0.8 10*3/uL (ref 0.7–4.0)
MCH: 33.7 pg (ref 26.0–34.0)
MCHC: 35.5 g/dL (ref 30.0–36.0)
MCV: 94.8 fL (ref 80.0–100.0)
Monocytes Absolute: 0.9 10*3/uL (ref 0.1–1.0)
Monocytes Relative: 12 %
Neutro Abs: 5.5 10*3/uL (ref 1.7–7.7)
Neutrophils Relative %: 73 %
Platelets: 270 10*3/uL (ref 150–400)
RBC: 4.04 MIL/uL — ABNORMAL LOW (ref 4.22–5.81)
RDW: 12.4 % (ref 11.5–15.5)
WBC: 7.6 10*3/uL (ref 4.0–10.5)
nRBC: 0 % (ref 0.0–0.2)

## 2020-05-21 LAB — URINALYSIS, ROUTINE W REFLEX MICROSCOPIC
Bilirubin Urine: NEGATIVE
Glucose, UA: NEGATIVE mg/dL
Hgb urine dipstick: NEGATIVE
Ketones, ur: NEGATIVE mg/dL
Leukocytes,Ua: NEGATIVE
Nitrite: NEGATIVE
Protein, ur: 30 mg/dL — AB
Specific Gravity, Urine: 1.01 (ref 1.005–1.030)
pH: 8 (ref 5.0–8.0)

## 2020-05-21 LAB — COMPREHENSIVE METABOLIC PANEL
ALT: 15 U/L (ref 0–44)
AST: 26 U/L (ref 15–41)
Albumin: 4.2 g/dL (ref 3.5–5.0)
Alkaline Phosphatase: 67 U/L (ref 38–126)
Anion gap: 11 (ref 5–15)
BUN: 7 mg/dL — ABNORMAL LOW (ref 8–23)
CO2: 21 mmol/L — ABNORMAL LOW (ref 22–32)
Calcium: 9.1 mg/dL (ref 8.9–10.3)
Chloride: 95 mmol/L — ABNORMAL LOW (ref 98–111)
Creatinine, Ser: 0.68 mg/dL (ref 0.61–1.24)
GFR, Estimated: >60 mL/min (ref >60)
Glucose, Bld: 118 mg/dL — ABNORMAL HIGH (ref 70–99)
Potassium: 3.9 mmol/L (ref 3.5–5.1)
Sodium: 127 mmol/L — ABNORMAL LOW (ref 135–145)
Total Bilirubin: 0.8 mg/dL (ref 0.3–1.2)
Total Protein: 8.4 g/dL — ABNORMAL HIGH (ref 6.5–8.1)

## 2020-05-21 LAB — URINALYSIS, MICROSCOPIC (REFLEX): Squamous Epithelial / HPF: NONE SEEN (ref 0–5)

## 2020-05-21 LAB — ETHANOL: Alcohol, Ethyl (B): <10 mg/dL (ref <10)

## 2020-05-21 LAB — LIPASE, BLOOD: Lipase: 55 U/L — ABNORMAL HIGH (ref 11–51)

## 2020-05-21 MED ORDER — ONDANSETRON HCL 4 MG/2ML IJ SOLN
4.0000 mg | Freq: Once | INTRAMUSCULAR | Status: AC
  Administered 2020-05-21: 4 mg via INTRAVENOUS
  Filled 2020-05-21: qty 2

## 2020-05-21 MED ORDER — SUCRALFATE 1 G PO TABS: 1.0000 g | ORAL_TABLET | Freq: Once | ORAL | Status: DC

## 2020-05-21 MED ORDER — SUCRALFATE 1 GM/10ML PO SUSP
1.0000 g | Freq: Once | ORAL | Status: AC
  Administered 2020-05-21: 1 g via ORAL
  Filled 2020-05-21: qty 10

## 2020-05-21 MED ORDER — PANTOPRAZOLE SODIUM 40 MG IV SOLR
40.0000 mg | Freq: Once | INTRAVENOUS | Status: AC
  Administered 2020-05-21: 40 mg via INTRAVENOUS
  Filled 2020-05-21: qty 40

## 2020-05-21 MED ORDER — THIAMINE HCL 100 MG/ML IJ SOLN
100.0000 mg | Freq: Every day | INTRAMUSCULAR | Status: DC
  Administered 2020-05-21: 100 mg via INTRAVENOUS
  Filled 2020-05-21: qty 2

## 2020-05-21 MED ORDER — THIAMINE HCL 100 MG/ML IJ SOLN
INTRAMUSCULAR | Status: AC
  Filled 2020-05-21: qty 2

## 2020-05-21 NOTE — ED Triage Notes (Signed)
Pt arrives via [facility] with c/o NV since yesterday denies pain. VS 170/100 HR 102 97% RA, 18 RR with EMS  Pt unsure of number of episodes of vomiting.

## 2020-05-21 NOTE — ED Notes (Signed)
ED Provider at bedside. 
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
## 2020-05-21 NOTE — Discharge Instructions (Addendum)
Please discontinue alcohol use, resources were provided to you at length.  If you experience any palpitations, dizziness, worsening symptoms please return to the emergency department.

## 2020-05-21 NOTE — ED Notes (Signed)
Pt reports drinking about 12 beers per day at least, states his last drink was last night, reports he typically drinks during the day but is unable to keep anything down at this time.

## 2020-09-07 ENCOUNTER — Other Ambulatory Visit: Payer: Self-pay

## 2020-09-07 ENCOUNTER — Encounter (HOSPITAL_BASED_OUTPATIENT_CLINIC_OR_DEPARTMENT_OTHER): Payer: Self-pay

## 2020-09-07 ENCOUNTER — Emergency Department (HOSPITAL_BASED_OUTPATIENT_CLINIC_OR_DEPARTMENT_OTHER)
Admission: EM | Admit: 2020-09-07 | Discharge: 2020-09-08 | Disposition: A | Discharge status: Home / Self Care | Payer: Self-pay | Attending: Emergency Medicine | Admitting: Emergency Medicine

## 2020-09-07 DIAGNOSIS — R112 Nausea with vomiting, unspecified: Secondary | ICD-10-CM | POA: Insufficient documentation

## 2020-09-07 DIAGNOSIS — F17210 Nicotine dependence, cigarettes, uncomplicated: Secondary | ICD-10-CM | POA: Insufficient documentation

## 2020-09-07 NOTE — ED Triage Notes (Signed)
[place] [facility] [place] for reports of vomiting all day. States he has been unable to keep down any food. Has been able to drink 5-6 beers and keep them down. Denies abd pain or diarrhea.

## 2020-09-08 ENCOUNTER — Encounter (HOSPITAL_BASED_OUTPATIENT_CLINIC_OR_DEPARTMENT_OTHER): Payer: Self-pay | Admitting: Emergency Medicine

## 2020-09-08 LAB — CBC WITH DIFFERENTIAL/PLATELET
Abs Immature Granulocytes: 0.02 10*3/uL (ref 0.00–0.07)
Basophils Absolute: 0.1 10*3/uL (ref 0.0–0.1)
Basophils Relative: 1 %
Eosinophils Absolute: 1 10*3/uL — ABNORMAL HIGH (ref 0.0–0.5)
Eosinophils Relative: 15 %
HCT: 39 % (ref 39.0–52.0)
Hemoglobin: 13.8 g/dL (ref 13.0–17.0)
Immature Granulocytes: 0 %
Lymphocytes Relative: 19 %
Lymphs Abs: 1.3 10*3/uL (ref 0.7–4.0)
MCH: 33.1 pg (ref 26.0–34.0)
MCHC: 35.4 g/dL (ref 30.0–36.0)
MCV: 93.5 fL (ref 80.0–100.0)
Monocytes Absolute: 0.6 10*3/uL (ref 0.1–1.0)
Monocytes Relative: 9 %
Neutro Abs: 3.8 10*3/uL (ref 1.7–7.7)
Neutrophils Relative %: 56 %
Platelets: 321 10*3/uL (ref 150–400)
RBC: 4.17 MIL/uL — ABNORMAL LOW (ref 4.22–5.81)
RDW: 11.9 % (ref 11.5–15.5)
WBC: 6.8 10*3/uL (ref 4.0–10.5)
nRBC: 0 % (ref 0.0–0.2)

## 2020-09-08 LAB — COMPREHENSIVE METABOLIC PANEL
ALT: 40 U/L (ref 0–44)
AST: 62 U/L — ABNORMAL HIGH (ref 15–41)
Albumin: 4.2 g/dL (ref 3.5–5.0)
Alkaline Phosphatase: 72 U/L (ref 38–126)
Anion gap: 11 (ref 5–15)
BUN: 8 mg/dL (ref 8–23)
CO2: 24 mmol/L (ref 22–32)
Calcium: 9.1 mg/dL (ref 8.9–10.3)
Chloride: 92 mmol/L — ABNORMAL LOW (ref 98–111)
Creatinine, Ser: 0.74 mg/dL (ref 0.61–1.24)
GFR, Estimated: >60 mL/min (ref >60)
Glucose, Bld: 101 mg/dL — ABNORMAL HIGH (ref 70–99)
Potassium: 3.9 mmol/L (ref 3.5–5.1)
Sodium: 127 mmol/L — ABNORMAL LOW (ref 135–145)
Total Bilirubin: 0.6 mg/dL (ref 0.3–1.2)
Total Protein: 8.4 g/dL — ABNORMAL HIGH (ref 6.5–8.1)

## 2020-09-08 LAB — URINALYSIS, ROUTINE W REFLEX MICROSCOPIC
Glucose, UA: NEGATIVE mg/dL
Hgb urine dipstick: NEGATIVE
Ketones, ur: NEGATIVE mg/dL
Leukocytes,Ua: NEGATIVE
Nitrite: NEGATIVE
Protein, ur: NEGATIVE mg/dL
Specific Gravity, Urine: 1.025 (ref 1.005–1.030)
pH: 5.5 (ref 5.0–8.0)

## 2020-09-08 LAB — LIPASE, BLOOD: Lipase: 39 U/L (ref 11–51)

## 2020-09-08 MED ORDER — ALUM & MAG HYDROXIDE-SIMETH 200-200-20 MG/5ML PO SUSP
30.0000 mL | Freq: Once | ORAL | Status: AC
  Administered 2020-09-08: 01:00:00 30 mL via ORAL
  Filled 2020-09-08: qty 30

## 2020-09-08 MED ORDER — ONDANSETRON 4 MG PO TBDP
8.0000 mg | ORAL_TABLET | Freq: Once | ORAL | Status: AC
  Administered 2020-09-08: 8 mg via ORAL
  Filled 2020-09-08: qty 2

## 2020-09-08 MED ORDER — OMEPRAZOLE 20 MG PO CPDR: 20.0000 mg | DELAYED_RELEASE_CAPSULE | Freq: Every day | ORAL | 0 refills | Status: DC

## 2020-09-08 NOTE — ED Notes (Signed)
Pt was given room temp ginger ale.
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
## 2020-09-08 NOTE — ED Notes (Signed)
Pt drank ginger ale without vomiting. Denies nausea at this time.

## 2020-09-20 ENCOUNTER — Other Ambulatory Visit: Payer: Self-pay

## 2020-09-20 ENCOUNTER — Encounter (HOSPITAL_COMMUNITY)
Admission: EM | Disposition: A | Discharge status: Home / Self Care | Payer: Self-pay | Source: Home / Self Care | Attending: Internal Medicine

## 2020-09-20 ENCOUNTER — Observation Stay (HOSPITAL_COMMUNITY): Payer: Medicaid Other

## 2020-09-20 ENCOUNTER — Observation Stay (HOSPITAL_COMMUNITY): Payer: Medicaid Other | Admitting: Anesthesiology

## 2020-09-20 ENCOUNTER — Inpatient Hospital Stay (HOSPITAL_COMMUNITY): Payer: Medicaid Other

## 2020-09-20 ENCOUNTER — Encounter (HOSPITAL_COMMUNITY): Payer: Self-pay | Admitting: Internal Medicine

## 2020-09-20 ENCOUNTER — Inpatient Hospital Stay (HOSPITAL_COMMUNITY)
Admission: EM | Admit: 2020-09-20 | Discharge: 2020-09-23 | DRG: 356 | Disposition: A | Discharge status: Home / Self Care | Payer: Medicaid Other | Attending: Internal Medicine | Admitting: Internal Medicine

## 2020-09-20 DIAGNOSIS — K2210 Ulcer of esophagus without bleeding: Secondary | ICD-10-CM | POA: Diagnosis present

## 2020-09-20 DIAGNOSIS — E871 Hypo-osmolality and hyponatremia: Secondary | ICD-10-CM

## 2020-09-20 DIAGNOSIS — F209 Schizophrenia, unspecified: Secondary | ICD-10-CM | POA: Diagnosis present

## 2020-09-20 DIAGNOSIS — K922 Gastrointestinal hemorrhage, unspecified: Secondary | ICD-10-CM

## 2020-09-20 DIAGNOSIS — Z5900 Homelessness unspecified: Secondary | ICD-10-CM

## 2020-09-20 DIAGNOSIS — B953 Streptococcus pneumoniae as the cause of diseases classified elsewhere: Secondary | ICD-10-CM | POA: Diagnosis present

## 2020-09-20 DIAGNOSIS — F319 Bipolar disorder, unspecified: Secondary | ICD-10-CM | POA: Diagnosis present

## 2020-09-20 DIAGNOSIS — J95821 Acute postprocedural respiratory failure: Secondary | ICD-10-CM | POA: Diagnosis not present

## 2020-09-20 DIAGNOSIS — Z79899 Other long term (current) drug therapy: Secondary | ICD-10-CM

## 2020-09-20 DIAGNOSIS — K920 Hematemesis: Secondary | ICD-10-CM

## 2020-09-20 DIAGNOSIS — E876 Hypokalemia: Secondary | ICD-10-CM

## 2020-09-20 DIAGNOSIS — F1099 Alcohol use, unspecified with unspecified alcohol-induced disorder: Secondary | ICD-10-CM | POA: Diagnosis present

## 2020-09-20 DIAGNOSIS — F1010 Alcohol abuse, uncomplicated: Secondary | ICD-10-CM | POA: Diagnosis present

## 2020-09-20 DIAGNOSIS — K2100 Gastro-esophageal reflux disease with esophagitis, without bleeding: Secondary | ICD-10-CM

## 2020-09-20 DIAGNOSIS — Z978 Presence of other specified devices: Secondary | ICD-10-CM

## 2020-09-20 DIAGNOSIS — Z811 Family history of alcohol abuse and dependence: Secondary | ICD-10-CM

## 2020-09-20 DIAGNOSIS — K254 Chronic or unspecified gastric ulcer with hemorrhage: Secondary | ICD-10-CM

## 2020-09-20 DIAGNOSIS — K7030 Alcoholic cirrhosis of liver without ascites: Secondary | ICD-10-CM | POA: Diagnosis present

## 2020-09-20 DIAGNOSIS — K250 Acute gastric ulcer with hemorrhage: Principal | ICD-10-CM | POA: Diagnosis present

## 2020-09-20 DIAGNOSIS — K7460 Unspecified cirrhosis of liver: Secondary | ICD-10-CM

## 2020-09-20 DIAGNOSIS — J690 Pneumonitis due to inhalation of food and vomit: Secondary | ICD-10-CM | POA: Diagnosis not present

## 2020-09-20 DIAGNOSIS — D62 Acute posthemorrhagic anemia: Secondary | ICD-10-CM | POA: Diagnosis present

## 2020-09-20 DIAGNOSIS — F17210 Nicotine dependence, cigarettes, uncomplicated: Secondary | ICD-10-CM | POA: Diagnosis present

## 2020-09-20 DIAGNOSIS — Z9049 Acquired absence of other specified parts of digestive tract: Secondary | ICD-10-CM

## 2020-09-20 DIAGNOSIS — K219 Gastro-esophageal reflux disease without esophagitis: Secondary | ICD-10-CM | POA: Diagnosis present

## 2020-09-20 DIAGNOSIS — K760 Fatty (change of) liver, not elsewhere classified: Secondary | ICD-10-CM | POA: Diagnosis present

## 2020-09-20 DIAGNOSIS — D649 Anemia, unspecified: Secondary | ICD-10-CM

## 2020-09-20 DIAGNOSIS — Z5902 Unsheltered homelessness: Secondary | ICD-10-CM

## 2020-09-20 DIAGNOSIS — Z20822 Contact with and (suspected) exposure to covid-19: Secondary | ICD-10-CM | POA: Diagnosis present

## 2020-09-20 DIAGNOSIS — Z881 Allergy status to other antibiotic agents status: Secondary | ICD-10-CM

## 2020-09-20 HISTORY — PX: ESOPHAGOGASTRODUODENOSCOPY (EGD) WITH PROPOFOL: SHX5813

## 2020-09-20 HISTORY — PX: SCLEROTHERAPY: SHX6841

## 2020-09-20 HISTORY — PX: IR ANGIOGRAM FOLLOW UP STUDY: IMG697

## 2020-09-20 HISTORY — PX: IR US GUIDE VASC ACCESS RIGHT: IMG2390

## 2020-09-20 HISTORY — PX: IR ANGIOGRAM VISCERAL SELECTIVE: IMG657

## 2020-09-20 HISTORY — PX: IR EMBO ART  VEN HEMORR LYMPH EXTRAV  INC GUIDE ROADMAPPING: IMG5450

## 2020-09-20 HISTORY — PX: HEMOSTASIS CONTROL: SHX6838

## 2020-09-20 HISTORY — PX: HOT HEMOSTASIS: SHX5433

## 2020-09-20 LAB — POCT I-STAT 7, (LYTES, BLD GAS, ICA,H+H)
Acid-Base Excess: 1 mmol/L (ref 0.0–2.0)
Acid-Base Excess: 0 mmol/L (ref 0.0–2.0)
Bicarbonate: 24.7 mmol/L (ref 20.0–28.0)
Bicarbonate: 22.4 mmol/L (ref 20.0–28.0)
Calcium, Ion: 1.11 mmol/L — ABNORMAL LOW (ref 1.15–1.40)
Calcium, Ion: 1.14 mmol/L — ABNORMAL LOW (ref 1.15–1.40)
HCT: 27 % — ABNORMAL LOW (ref 39.0–52.0)
HCT: 30 % — ABNORMAL LOW (ref 39.0–52.0)
Hemoglobin: 9.2 g/dL — ABNORMAL LOW (ref 13.0–17.0)
Hemoglobin: 10.2 g/dL — ABNORMAL LOW (ref 13.0–17.0)
O2 Saturation: 100 %
O2 Saturation: 99 %
Patient temperature: 35.7
Patient temperature: 97.5
Potassium: 3.7 mmol/L (ref 3.5–5.1)
Potassium: 3.5 mmol/L (ref 3.5–5.1)
Sodium: 133 mmol/L — ABNORMAL LOW (ref 135–145)
Sodium: 134 mmol/L — ABNORMAL LOW (ref 135–145)
TCO2: 26 mmol/L (ref 22–32)
TCO2: 23 mmol/L (ref 22–32)
pCO2 arterial: 34.6 mmHg (ref 32.0–48.0)
pCO2 arterial: 28.8 mmHg — ABNORMAL LOW (ref 32.0–48.0)
pH, Arterial: 7.457 — ABNORMAL HIGH (ref 7.350–7.450)
pH, Arterial: 7.497 — ABNORMAL HIGH (ref 7.350–7.450)
pO2, Arterial: 223 mmHg — ABNORMAL HIGH (ref 83.0–108.0)
pO2, Arterial: 109 mmHg — ABNORMAL HIGH (ref 83.0–108.0)

## 2020-09-20 LAB — ETHANOL: Alcohol, Ethyl (B): <10 mg/dL (ref <10)

## 2020-09-20 LAB — CBC WITH DIFFERENTIAL/PLATELET
Abs Immature Granulocytes: 0.05 10*3/uL (ref 0.00–0.07)
Basophils Absolute: 0.1 10*3/uL (ref 0.0–0.1)
Basophils Relative: 1 %
Eosinophils Absolute: 0.6 10*3/uL — ABNORMAL HIGH (ref 0.0–0.5)
Eosinophils Relative: 7 %
HCT: 22.5 % — ABNORMAL LOW (ref 39.0–52.0)
Hemoglobin: 7.8 g/dL — ABNORMAL LOW (ref 13.0–17.0)
Immature Granulocytes: 1 %
Lymphocytes Relative: 16 %
Lymphs Abs: 1.2 10*3/uL (ref 0.7–4.0)
MCH: 33.6 pg (ref 26.0–34.0)
MCHC: 34.7 g/dL (ref 30.0–36.0)
MCV: 97 fL (ref 80.0–100.0)
Monocytes Absolute: 0.8 10*3/uL (ref 0.1–1.0)
Monocytes Relative: 11 %
Neutro Abs: 4.9 10*3/uL (ref 1.7–7.7)
Neutrophils Relative %: 64 %
Platelets: 321 10*3/uL (ref 150–400)
RBC: 2.32 MIL/uL — ABNORMAL LOW (ref 4.22–5.81)
RDW: 12 % (ref 11.5–15.5)
WBC: 7.7 10*3/uL (ref 4.0–10.5)
nRBC: 0 % (ref 0.0–0.2)

## 2020-09-20 LAB — PREPARE RBC (CROSSMATCH)

## 2020-09-20 LAB — MRSA NEXT GEN BY PCR, NASAL: MRSA by PCR Next Gen: NOT DETECTED

## 2020-09-20 LAB — PROTIME-INR
INR: 1 (ref 0.8–1.2)
Prothrombin Time: 13.4 seconds (ref 11.4–15.2)

## 2020-09-20 LAB — COMPREHENSIVE METABOLIC PANEL
ALT: 13 U/L (ref 0–44)
AST: 18 U/L (ref 15–41)
Albumin: 2.6 g/dL — ABNORMAL LOW (ref 3.5–5.0)
Alkaline Phosphatase: 37 U/L — ABNORMAL LOW (ref 38–126)
Anion gap: 10 (ref 5–15)
BUN: 25 mg/dL — ABNORMAL HIGH (ref 8–23)
CO2: 24 mmol/L (ref 22–32)
Calcium: 8.4 mg/dL — ABNORMAL LOW (ref 8.9–10.3)
Chloride: 98 mmol/L (ref 98–111)
Creatinine, Ser: 0.81 mg/dL (ref 0.61–1.24)
GFR, Estimated: >60 mL/min (ref >60)
Glucose, Bld: 123 mg/dL — ABNORMAL HIGH (ref 70–99)
Potassium: 3.2 mmol/L — ABNORMAL LOW (ref 3.5–5.1)
Sodium: 132 mmol/L — ABNORMAL LOW (ref 135–145)
Total Bilirubin: 0.2 mg/dL — ABNORMAL LOW (ref 0.3–1.2)
Total Protein: 5.1 g/dL — ABNORMAL LOW (ref 6.5–8.1)

## 2020-09-20 LAB — SARS CORONAVIRUS 2 (TAT 6-24 HRS): SARS Coronavirus 2: NEGATIVE

## 2020-09-20 LAB — GLUCOSE, CAPILLARY
Glucose-Capillary: 129 mg/dL — ABNORMAL HIGH (ref 70–99)
Glucose-Capillary: 111 mg/dL — ABNORMAL HIGH (ref 70–99)
Glucose-Capillary: 89 mg/dL (ref 70–99)

## 2020-09-20 LAB — LIPASE, BLOOD: Lipase: 30 U/L (ref 11–51)

## 2020-09-20 SURGERY — ESOPHAGOGASTRODUODENOSCOPY (EGD) WITH PROPOFOL
Anesthesia: Monitor Anesthesia Care

## 2020-09-20 MED ORDER — FENTANYL CITRATE (PF) 100 MCG/2ML IJ SOLN
100.0000 ug | Freq: Once | INTRAMUSCULAR | Status: AC
  Administered 2020-09-20: 100 ug via INTRAVENOUS

## 2020-09-20 MED ORDER — DOCUSATE SODIUM 50 MG/5ML PO LIQD: 100.0000 mg | Freq: Two times a day (BID) | ORAL | Status: DC

## 2020-09-20 MED ORDER — SODIUM CHLORIDE (PF) 0.9 % IJ SOLN
PREFILLED_SYRINGE | INTRAMUSCULAR | Status: DC | PRN
  Administered 2020-09-20: 5 mL

## 2020-09-20 MED ORDER — LORAZEPAM 2 MG/ML IJ SOLN
0.0000 mg | Freq: Four times a day (QID) | INTRAMUSCULAR | Status: DC
  Administered 2020-09-20: 0 mg via INTRAVENOUS

## 2020-09-20 MED ORDER — SODIUM CHLORIDE (PF) 0.9 % IJ SOLN: INTRAMUSCULAR | Status: DC | PRN

## 2020-09-20 MED ORDER — CHLORHEXIDINE GLUCONATE CLOTH 2 % EX PADS
6.0000 | MEDICATED_PAD | Freq: Every day | CUTANEOUS | Status: DC
  Administered 2020-09-20 – 2020-09-22 (×3): 6 via TOPICAL

## 2020-09-20 MED ORDER — MIDAZOLAM HCL 2 MG/2ML IJ SOLN: 2.0000 mg | INTRAMUSCULAR | Status: DC | PRN

## 2020-09-20 MED ORDER — FENTANYL 2500MCG IN NS 250ML (10MCG/ML) PREMIX INFUSION
50.0000 ug/h | INTRAVENOUS | Status: DC
Start: 2020-09-20 — End: 2020-09-21
  Administered 2020-09-20: 50 ug/h via INTRAVENOUS
  Filled 2020-09-20: qty 250

## 2020-09-20 MED ORDER — PROPOFOL 500 MG/50ML IV EMUL
INTRAVENOUS | Status: DC | PRN
  Administered 2020-09-20: 175 ug/kg/min via INTRAVENOUS

## 2020-09-20 MED ORDER — SODIUM CHLORIDE 0.9% IV SOLUTION: Freq: Once | INTRAVENOUS | Status: AC

## 2020-09-20 MED ORDER — CHLORHEXIDINE GLUCONATE 0.12% ORAL RINSE (MEDLINE KIT)
15.0000 mL | Freq: Two times a day (BID) | OROMUCOSAL | Status: DC
  Administered 2020-09-20 – 2020-09-22 (×4): 15 mL via OROMUCOSAL

## 2020-09-20 MED ORDER — PANTOPRAZOLE 80MG IVPB - SIMPLE MED
80.0000 mg | Freq: Once | INTRAVENOUS | Status: AC
  Administered 2020-09-20: 04:00:00 80 mg via INTRAVENOUS
  Filled 2020-09-20: qty 80

## 2020-09-20 MED ORDER — ACETAMINOPHEN 650 MG RE SUPP
650.0000 mg | Freq: Four times a day (QID) | RECTAL | Status: DC | PRN
Start: 2020-09-20 — End: 2020-09-23

## 2020-09-20 MED ORDER — SUCCINYLCHOLINE CHLORIDE 200 MG/10ML IV SOSY
PREFILLED_SYRINGE | INTRAVENOUS | Status: DC | PRN
  Administered 2020-09-20: 140 mg via INTRAVENOUS

## 2020-09-20 MED ORDER — ROCURONIUM BROMIDE 10 MG/ML (PF) SYRINGE
PREFILLED_SYRINGE | INTRAVENOUS | Status: DC | PRN
  Administered 2020-09-20: 50 mg via INTRAVENOUS
  Administered 2020-09-20: 20 mg via INTRAVENOUS
  Administered 2020-09-20: 30 mg via INTRAVENOUS

## 2020-09-20 MED ORDER — NICOTINE 21 MG/24HR TD PT24
21.0000 mg | MEDICATED_PATCH | Freq: Once | TRANSDERMAL | Status: DC
  Administered 2020-09-20: 21 mg via TRANSDERMAL
  Filled 2020-09-20: qty 1

## 2020-09-20 MED ORDER — MIDAZOLAM HCL 2 MG/2ML IJ SOLN
4.0000 mg | Freq: Once | INTRAMUSCULAR | Status: AC
  Administered 2020-09-20: 4 mg via INTRAVENOUS
  Filled 2020-09-20: qty 4

## 2020-09-20 MED ORDER — LORAZEPAM 1 MG PO TABS: 0.0000 mg | ORAL_TABLET | Freq: Four times a day (QID) | ORAL | Status: DC

## 2020-09-20 MED ORDER — THIAMINE HCL 100 MG/ML IJ SOLN
100.0000 mg | Freq: Every day | INTRAMUSCULAR | Status: DC
  Administered 2020-09-20 – 2020-09-21 (×2): 100 mg via INTRAVENOUS
  Filled 2020-09-20 (×2): qty 2

## 2020-09-20 MED ORDER — SODIUM CHLORIDE 0.9 % IV SOLN: INTRAVENOUS | Status: DC | PRN

## 2020-09-20 MED ORDER — ONDANSETRON HCL 4 MG/2ML IJ SOLN: 4.0000 mg | Freq: Four times a day (QID) | INTRAMUSCULAR | Status: DC | PRN

## 2020-09-20 MED ORDER — FENTANYL BOLUS VIA INFUSION
50.0000 ug | INTRAVENOUS | Status: DC | PRN
Start: 2020-09-20 — End: 2020-09-21
  Filled 2020-09-20: qty 100

## 2020-09-20 MED ORDER — FENTANYL CITRATE (PF) 100 MCG/2ML IJ SOLN
50.0000 ug | Freq: Once | INTRAMUSCULAR | Status: DC
Start: 2020-09-20 — End: 2020-09-20

## 2020-09-20 MED ORDER — PANTOPRAZOLE SODIUM 40 MG IV SOLR: 40.0000 mg | Freq: Two times a day (BID) | INTRAVENOUS | Status: DC

## 2020-09-20 MED ORDER — HYDRALAZINE HCL 20 MG/ML IJ SOLN
10.0000 mg | INTRAMUSCULAR | Status: DC | PRN
  Administered 2020-09-20: 10 mg via INTRAVENOUS
  Filled 2020-09-20 (×3): qty 1

## 2020-09-20 MED ORDER — EPINEPHRINE 1 MG/10ML IJ SOSY
PREFILLED_SYRINGE | INTRAMUSCULAR | Status: AC
  Filled 2020-09-20: qty 10

## 2020-09-20 MED ORDER — POLYETHYLENE GLYCOL 3350 17 G PO PACK: 17.0000 g | PACK | Freq: Every day | ORAL | Status: DC

## 2020-09-20 MED ORDER — LORAZEPAM 2 MG/ML IJ SOLN: 0.0000 mg | Freq: Two times a day (BID) | INTRAMUSCULAR | Status: DC

## 2020-09-20 MED ORDER — LIDOCAINE HCL 1 % IJ SOLN
INTRAMUSCULAR | Status: AC
  Filled 2020-09-20: qty 20

## 2020-09-20 MED ORDER — ACETAMINOPHEN 325 MG PO TABS: 650.0000 mg | ORAL_TABLET | Freq: Four times a day (QID) | ORAL | Status: DC | PRN

## 2020-09-20 MED ORDER — ONDANSETRON HCL 4 MG PO TABS: 4.0000 mg | ORAL_TABLET | Freq: Four times a day (QID) | ORAL | Status: DC | PRN

## 2020-09-20 MED ORDER — LORAZEPAM 2 MG/ML IJ SOLN: 1.0000 mg | INTRAMUSCULAR | Status: AC | PRN

## 2020-09-20 MED ORDER — LORAZEPAM 1 MG PO TABS: 1.0000 mg | ORAL_TABLET | ORAL | Status: AC | PRN

## 2020-09-20 MED ORDER — PROPOFOL 10 MG/ML IV BOLUS
INTRAVENOUS | Status: DC | PRN
  Administered 2020-09-20: 20 mg via INTRAVENOUS

## 2020-09-20 MED ORDER — PROPOFOL 1000 MG/100ML IV EMUL
0.0000 ug/kg/min | INTRAVENOUS | Status: DC
  Administered 2020-09-20 (×2): 50 ug/kg/min via INTRAVENOUS
  Administered 2020-09-21: 40 ug/kg/min via INTRAVENOUS
  Filled 2020-09-20 (×3): qty 100

## 2020-09-20 MED ORDER — PHENYLEPHRINE HCL-NACL 10-0.9 MG/250ML-% IV SOLN
INTRAVENOUS | Status: DC | PRN
  Administered 2020-09-20: 25 ug/min via INTRAVENOUS

## 2020-09-20 MED ORDER — ORAL CARE MOUTH RINSE
15.0000 mL | OROMUCOSAL | Status: DC
  Administered 2020-09-20 – 2020-09-21 (×10): 15 mL via OROMUCOSAL

## 2020-09-20 MED ORDER — LACTATED RINGERS IV SOLN: INTRAVENOUS | Status: DC | PRN

## 2020-09-20 MED ORDER — NICOTINE 21 MG/24HR TD PT24
21.0000 mg | MEDICATED_PATCH | Freq: Every day | TRANSDERMAL | Status: DC
  Administered 2020-09-21 – 2020-09-23 (×3): 21 mg via TRANSDERMAL
  Filled 2020-09-20 (×3): qty 1

## 2020-09-20 MED ORDER — SODIUM CHLORIDE 0.9 % IV SOLN
50.0000 ug/h | INTRAVENOUS | Status: DC
  Administered 2020-09-20: 50 ug/h via INTRAVENOUS
  Filled 2020-09-20 (×2): qty 1

## 2020-09-20 MED ORDER — OCTREOTIDE LOAD VIA INFUSION
50.0000 ug | Freq: Once | INTRAVENOUS | Status: AC
  Administered 2020-09-20: 50 ug via INTRAVENOUS
  Filled 2020-09-20: qty 25

## 2020-09-20 MED ORDER — THIAMINE HCL 100 MG PO TABS
100.0000 mg | ORAL_TABLET | Freq: Every day | ORAL | Status: DC
  Administered 2020-09-22 – 2020-09-23 (×2): 100 mg via ORAL
  Filled 2020-09-20 (×3): qty 1

## 2020-09-20 MED ORDER — FENTANYL CITRATE (PF) 100 MCG/2ML IJ SOLN
INTRAMUSCULAR | Status: DC | PRN
  Administered 2020-09-20: 50 ug via INTRAVENOUS

## 2020-09-20 MED ORDER — PANTOPRAZOLE INFUSION (NEW) - SIMPLE MED
8.0000 mg/h | INTRAVENOUS | Status: AC
  Administered 2020-09-20 – 2020-09-22 (×5): 8 mg/h via INTRAVENOUS
  Filled 2020-09-20: qty 100
  Filled 2020-09-20 (×5): qty 80

## 2020-09-20 MED ORDER — FENTANYL CITRATE (PF) 100 MCG/2ML IJ SOLN: 50.0000 ug | INTRAMUSCULAR | Status: DC | PRN

## 2020-09-20 MED ORDER — LORAZEPAM 1 MG PO TABS: 0.0000 mg | ORAL_TABLET | Freq: Two times a day (BID) | ORAL | Status: DC

## 2020-09-20 MED ORDER — SODIUM CHLORIDE 0.9 % IV BOLUS
1000.0000 mL | Freq: Once | INTRAVENOUS | Status: AC
  Administered 2020-09-20: 1000 mL via INTRAVENOUS

## 2020-09-20 MED ORDER — FENTANYL CITRATE (PF) 100 MCG/2ML IJ SOLN
50.0000 ug | INTRAMUSCULAR | Status: DC | PRN
  Administered 2020-09-20: 100 ug via INTRAVENOUS
  Filled 2020-09-20: qty 4

## 2020-09-20 MED ORDER — ALBUMIN HUMAN 5 % IV SOLN: INTRAVENOUS | Status: DC | PRN

## 2020-09-20 SURGICAL SUPPLY — 15 items
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
## 2020-09-20 NOTE — ED Notes (Signed)
Consent at bedside.  
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
## 2020-09-20 NOTE — Sedation Documentation (Signed)
Dr. Randol Kern messaged to request ICU admit order.

## 2020-09-20 NOTE — Sedation Documentation (Addendum)
Spoke with Zella Ball in pt placement. Pt will need new admit order for ICU bed.
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
## 2020-09-20 NOTE — ED Notes (Signed)
Patient is get a unit of blood product

## 2020-09-20 NOTE — Procedures (Signed)
Interventional Radiology Procedure Note  Procedure: GDA angio and coil embo for bleeding pyloric ulcer on endo    Complications: None  Estimated Blood Loss:  min  Findings: Successful GDA embo with 6-12mm coils Full report in pacs     Sharen Counter, MD

## 2020-09-20 NOTE — ED Notes (Signed)
Patient transported to Ultrasound 

## 2020-09-20 NOTE — Transfer of Care (Signed)
Immediate Anesthesia Transfer of Care Note  Patient: Jimmy [name]  Procedure(s) Performed: ESOPHAGOGASTRODUODENOSCOPY (EGD) WITH PROPOFOL SCLEROTHERAPY HOT HEMOSTASIS (ARGON PLASMA COAGULATION/BICAP) HEMOSTASIS CONTROL  Patient Location: ICU  Anesthesia Type:General  Level of Consciousness: sedated and Patient remains intubated per anesthesia plan  Airway & Oxygen Therapy: Patient remains intubated per anesthesia plan and Patient placed on Ventilator (see vital sign flow sheet for setting)  Post-op Assessment: Report given to RN, Post -op Vital signs reviewed and stable and Patient moving all extremities X 4  Post vital signs: Reviewed and stable  Last Vitals:  Vitals Value Taken Time  BP 140/89 09/20/20 1408  Temp    Pulse 73 09/20/20 1409  Resp 23 09/20/20 1409  SpO2 100 % 09/20/20 1409  Vitals shown include unvalidated device data.  Last Pain:  Vitals:   09/20/20 1101  TempSrc: Oral  PainSc:          Complications: No notable events documented.

## 2020-09-20 NOTE — ED Triage Notes (Addendum)
[place] due to bloody emesis. Pt had an episode of bloody emesis at about 0230 and again with EMS at about 0300. Dark red in color. Pt has felt sick for a few days but denies abdominal pain or diarrhea. Pt slowly became hypotensive with EMS getting down to 92/60. 500cc bolus given and 4mg  of zofran.   Vitals: BP: 104/66 after bolus HR: 104 O2: 96% CBG: 170

## 2020-09-20 NOTE — Progress Notes (Addendum)
Large cratered pyloric channel ulcer with visible vessel, actively bleeding/spurting able to temporarily stop bleeding with Hemo spray.  Patient was intubated during EGD. He was transferred to IR for GDA embolization  Called and updated patient's brother, Vraj Denardo [phone]  Informed PCCM Brett Canales Minor ) that patient is currently in IR and will need ICU bed postprocedure given he may possibly remain intubated  K. Scherry Ran , MD [phone]

## 2020-09-20 NOTE — Anesthesia Procedure Notes (Signed)
Central Venous Catheter Insertion Performed by: Shelton Silvas, MD, anesthesiologist Start/End6/30/2022 12:00 PM, 09/20/2020 12:10 PM Patient location: Pre-op. Preanesthetic checklist: patient identified, IV checked, site marked, risks and benefits discussed, surgical consent, monitors and equipment checked, pre-op evaluation, timeout performed and anesthesia consent Position: Trendelenburg Lidocaine 1% used for infiltration and patient sedated Hand hygiene performed , maximum sterile barriers used  and Seldinger technique used Catheter size: 8 Fr Total catheter length 16. Central line was placed.Double lumen Procedure performed using ultrasound guided technique. Ultrasound Notes:anatomy identified, needle tip was noted to be adjacent to the nerve/plexus identified, no ultrasound evidence of intravascular and/or intraneural injection and image(s) printed for medical record Attempts: 1 Following insertion, dressing applied, line sutured and Biopatch. Post procedure assessment: blood return through all ports  Patient tolerated the procedure well with no immediate complications.

## 2020-09-20 NOTE — Anesthesia Procedure Notes (Signed)
Arterial Line Insertion Start/End6/30/2022 11:50 AM, 09/20/2020 12:00 PM Performed by: Lanell Matar, CRNA, CRNA  Preanesthetic checklist: patient identified, IV checked, site marked, risks and benefits discussed, surgical consent, monitors and equipment checked, pre-op evaluation, timeout performed and anesthesia consent Patient sedated Right, radial was placed Catheter size: 20 G  Attempts: 1 Procedure performed without using ultrasound guided technique. Following insertion, dressing applied and Biopatch. Post procedure assessment: normal

## 2020-09-20 NOTE — Op Note (Addendum)
[facility] Patient Name: Jimmy [name] Procedure Date : 09/20/2020 MRN: [id number] Attending MD: Napoleon Form , MD Date of Birth: 05-22-1955 CSN: [id number] Age: 65 Admit Type: Inpatient Procedure:                Upper GI endoscopy Indications:              Active gastrointestinal bleeding, Hematemesis Providers:                Napoleon Form, MD, Blenda Mounts, RN, Leanne Lovely, Technician, Bettey Mare. Print production planner, CRNA Referring MD:              Medicines:                Monitored Anesthesia Care Complications:            No immediate complications. Estimated Blood Loss:     Estimated blood loss: 250 mL requiring treatment                            with hemospray. Procedure:                Pre-Anesthesia Assessment:                           - Prior to the procedure, a History and Physical                            was performed, and patient medications and                            allergies were reviewed. The patient's tolerance of                            previous anesthesia was also reviewed. The risks                            and benefits of the procedure and the sedation                            options and risks were discussed with the patient.                            All questions were answered, and informed consent                            was obtained. Prior Anticoagulants: The patient has                            taken no previous anticoagulant or antiplatelet                            agents. ASA Grade Assessment: III - A patient with  severe systemic disease. After reviewing the risks                            and benefits, the patient was deemed in                            satisfactory condition to undergo the procedure.                           After obtaining informed consent, the endoscope was                            passed under direct vision. Throughout the                             procedure, the patient's blood pressure, pulse, and                            oxygen saturations were monitored continuously. The                            GIF-H190 ([id number]) Olympus gastroscope was                            introduced through the mouth, and advanced to the                            second part of duodenum. The upper GI endoscopy was                            technically difficult and complex due to excessive                            bleeding. Successful completion of the procedure                            was aided by treating with intubation and                            ventilation. Scope In: Scope Out: Findings:      LA Grade D (one or more mucosal breaks involving at least 75% of       esophageal circumference) esophagitis with no bleeding was found 38 cm       from the incisors.      Large amount of red blood and clot was found in the gastric fundus.      One partially obstructing large cratered gastric ulcer with a visible       vessel was found at the pylorus. The lesion was 12 mm in largest       dimension. There is no evidence of perforation. Area was injected with 5       mL of a 1:100,000 solution of epinephrine for hemostasis. Coagulation       for hemostasis using bipolar probe was unsuccessful. Visible vessel       started spurting with active bleeding.  To stop active bleeding,       hemostatic spray was deployed. A single spray was applied. There was no       bleeding at the end of the procedure, large clot covering the ulcer, the       clot was not dislodged, at risk for recurrent bleed.      The examined duodenum was normal. Impression:               - LA Grade D reflux and erosive esophagitis with no                            bleeding.                           - Large blood clot in the gastric fundus.                           - Partially obstructing spurting gastric ulcer with                            a visible  vessel. Treatment not successful with                            bipolar cautery. Hemostatic spray applied, resulted                            in large clot covering the ulcer base.                           - Normal examined duodenum.                           - No specimens collected. Recommendation:           - NPO.                           - Transfer patient to IR for GDA embolization                           - Continue present medications.                           - PPI gtt for 72 hours and then switch to PPI twice                            daily                           - Monitor Hgb and transfuse to >7 Procedure Code(s):        --- Professional ---                           [phone], Esophagogastroduodenoscopy, flexible,                            transoral; with control of bleeding, any method Diagnosis  Code(s):        --- Professional ---                           K21.00, Gastro-esophageal reflux disease with                            esophagitis, without bleeding                           K20.80, Other esophagitis without bleeding                           K92.2, Gastrointestinal hemorrhage, unspecified                           K25.4, Chronic or unspecified gastric ulcer with                            hemorrhage                           K92.0, Hematemesis CPT copyright 2019 American [employer] Association. All rights reserved. The codes documented in this report are preliminary and upon coder review may  be revised to meet current compliance requirements. Napoleon Form, MD 09/20/2020 12:16:02 PM This report has been signed electronically. Number of Addenda: 0

## 2020-09-20 NOTE — Sedation Documentation (Signed)
Spoke with Fleet Contras, RN, 4M Charge. Aware of pending admission. Will call back in 5 minutes for bed assignment.
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
## 2020-09-20 NOTE — Anesthesia Postprocedure Evaluation (Signed)
Anesthesia Post Note  Patient: Jimmy [name]  Procedure(s) Performed: ESOPHAGOGASTRODUODENOSCOPY (EGD) WITH PROPOFOL SCLEROTHERAPY HOT HEMOSTASIS (ARGON PLASMA COAGULATION/BICAP) HEMOSTASIS CONTROL     Patient location during evaluation: SICU Anesthesia Type: General Level of consciousness: sedated Pain management: pain level controlled Vital Signs Assessment: post-procedure vital signs reviewed and stable Respiratory status: patient remains intubated per anesthesia plan Cardiovascular status: stable Postop Assessment: no apparent nausea or vomiting Anesthetic complications: no   No notable events documented.  Last Vitals:  Vitals:   09/20/20 1101 09/20/20 1412  BP: (!) 148/71   Pulse: 78   Resp: (!) 21   Temp: 36.9 C   SpO2:  100%    Last Pain:  Vitals:   09/20/20 1101  TempSrc: Oral  PainSc:                  Shelton Silvas

## 2020-09-20 NOTE — Interval H&P Note (Signed)
History and Physical Interval Note:  09/20/2020 10:35 AM  Jimmy [name]  has presented today for surgery, with the diagnosis of Hematemesis.  Acute blood loss anemia.   Alcoholism..  The various methods of treatment have been discussed with the patient and family. After consideration of risks, benefits and other options for treatment, the patient has consented to  Procedure(s): ESOPHAGOGASTRODUODENOSCOPY (EGD) WITH PROPOFOL (N/A) as a surgical intervention.  The patient's history has been reviewed, patient examined, no change in status, stable for surgery.  I have reviewed the patient's chart and labs.  Questions were answered to the patient's satisfaction.     Cardale Dorer

## 2020-09-20 NOTE — ED Notes (Signed)
Endo called this RN & report was given for this pt to go get an upper endoscopy done, they reported that they will come get the pt soon.

## 2020-09-20 NOTE — Anesthesia Preprocedure Evaluation (Addendum)
Anesthesia Evaluation  Patient identified by MRN, date of birth, ID band Patient awake    Reviewed: Allergy & Precautions, NPO status , Patient's Chart, lab work & pertinent test results  Airway Mallampati: II  TM Distance: >3 FB Neck ROM: Full    Dental  (+) Poor Dentition, Missing, Chipped   Pulmonary Current Smoker,    breath sounds clear to auscultation       Cardiovascular negative cardio ROS   Rhythm:Regular Rate:Normal     Neuro/Psych Seizures -,  PSYCHIATRIC DISORDERS Depression Bipolar Disorder    GI/Hepatic Neg liver ROS, PUD, GERD  Medicated,  Endo/Other  negative endocrine ROS  Renal/GU negative Renal ROS     Musculoskeletal negative musculoskeletal ROS (+)   Abdominal Normal abdominal exam  (+)   Peds  Hematology negative hematology ROS (+)   Anesthesia Other Findings   Reproductive/Obstetrics                            Anesthesia Physical Anesthesia Plan  ASA: 3  Anesthesia Plan: MAC   Post-op Pain Management:    Induction: Intravenous  PONV Risk Score and Plan: 0 and Propofol infusion  Airway Management Planned: Natural Airway and Nasal Cannula  Additional Equipment: None  Intra-op Plan:   Post-operative Plan: Possible Post-op intubation/ventilation  Informed Consent: I have reviewed the patients History and Physical, chart, labs and discussed the procedure including the risks, benefits and alternatives for the proposed anesthesia with the patient or authorized representative who has indicated his/her understanding and acceptance.     Dental advisory given  Plan Discussed with: CRNA  Anesthesia Plan Comments: (Discussed intubation and prolonged intubation if intervention is required. Mr. Minor is agreeable. )       Anesthesia Quick Evaluation

## 2020-09-20 NOTE — Anesthesia Procedure Notes (Signed)
Procedure Name: Intubation Date/Time: 09/20/2020 11:38 AM Performed by: Lanell Matar, CRNA Pre-anesthesia Checklist: Patient identified, Emergency Drugs available, Suction available and Patient being monitored Patient Re-evaluated:Patient Re-evaluated prior to induction Oxygen Delivery Method: Circle System Utilized Preoxygenation: Pre-oxygenation with 100% oxygen Induction Type: IV induction and Rapid sequence Laryngoscope Size: Miller and 2 Grade View: Grade II Tube type: Oral Tube size: 7.5 mm Number of attempts: 1 Airway Equipment and Method: Stylet and Oral airway Placement Confirmation: ETT inserted through vocal cords under direct vision, positive ETCO2 and breath sounds checked- equal and bilateral Secured at: 22 cm Tube secured with: Tape Dental Injury: Teeth and Oropharynx as per pre-operative assessment

## 2020-09-21 DIAGNOSIS — K250 Acute gastric ulcer with hemorrhage: Principal | ICD-10-CM

## 2020-09-21 DIAGNOSIS — Z9911 Dependence on respirator [ventilator] status: Secondary | ICD-10-CM

## 2020-09-21 DIAGNOSIS — D62 Acute posthemorrhagic anemia: Secondary | ICD-10-CM

## 2020-09-21 DIAGNOSIS — F1010 Alcohol abuse, uncomplicated: Secondary | ICD-10-CM

## 2020-09-21 LAB — COMPREHENSIVE METABOLIC PANEL
ALT: 12 U/L (ref 0–44)
AST: 14 U/L — ABNORMAL LOW (ref 15–41)
Albumin: 2.7 g/dL — ABNORMAL LOW (ref 3.5–5.0)
Alkaline Phosphatase: 37 U/L — ABNORMAL LOW (ref 38–126)
Anion gap: 6 (ref 5–15)
BUN: 16 mg/dL (ref 8–23)
CO2: 23 mmol/L (ref 22–32)
Calcium: 8.3 mg/dL — ABNORMAL LOW (ref 8.9–10.3)
Chloride: 102 mmol/L (ref 98–111)
Creatinine, Ser: 0.6 mg/dL — ABNORMAL LOW (ref 0.61–1.24)
GFR, Estimated: >60 mL/min (ref >60)
Glucose, Bld: 97 mg/dL (ref 70–99)
Potassium: 3.2 mmol/L — ABNORMAL LOW (ref 3.5–5.1)
Sodium: 131 mmol/L — ABNORMAL LOW (ref 135–145)
Total Bilirubin: 1 mg/dL (ref 0.3–1.2)
Total Protein: 5.2 g/dL — ABNORMAL LOW (ref 6.5–8.1)

## 2020-09-21 LAB — CBC
HCT: 31.2 % — ABNORMAL LOW (ref 39.0–52.0)
HCT: 31.6 % — ABNORMAL LOW (ref 39.0–52.0)
Hemoglobin: 10.9 g/dL — ABNORMAL LOW (ref 13.0–17.0)
Hemoglobin: 11 g/dL — ABNORMAL LOW (ref 13.0–17.0)
MCH: 32.2 pg (ref 26.0–34.0)
MCH: 32.4 pg (ref 26.0–34.0)
MCHC: 34.5 g/dL (ref 30.0–36.0)
MCHC: 35.3 g/dL (ref 30.0–36.0)
MCV: 91.8 fL (ref 80.0–100.0)
MCV: 93.2 fL (ref 80.0–100.0)
Platelets: 267 10*3/uL (ref 150–400)
Platelets: 270 K/uL (ref 150–400)
RBC: 3.39 MIL/uL — ABNORMAL LOW (ref 4.22–5.81)
RBC: 3.4 MIL/uL — ABNORMAL LOW (ref 4.22–5.81)
RDW: 13.5 % (ref 11.5–15.5)
RDW: 13.7 % (ref 11.5–15.5)
WBC: 10.2 10*3/uL (ref 4.0–10.5)
WBC: 9.7 K/uL (ref 4.0–10.5)
nRBC: 0 % (ref 0.0–0.2)
nRBC: 0 % (ref 0.0–0.2)

## 2020-09-21 LAB — HEPATITIS B SURFACE ANTIGEN: Hepatitis B Surface Ag: NONREACTIVE

## 2020-09-21 LAB — GLUCOSE, CAPILLARY
Glucose-Capillary: 106 mg/dL — ABNORMAL HIGH (ref 70–99)
Glucose-Capillary: 109 mg/dL — ABNORMAL HIGH (ref 70–99)
Glucose-Capillary: 115 mg/dL — ABNORMAL HIGH (ref 70–99)
Glucose-Capillary: 74 mg/dL (ref 70–99)
Glucose-Capillary: 79 mg/dL (ref 70–99)
Glucose-Capillary: 91 mg/dL (ref 70–99)
Glucose-Capillary: 97 mg/dL (ref 70–99)

## 2020-09-21 LAB — HEPATITIS A ANTIBODY, TOTAL: hep A Total Ab: NONREACTIVE

## 2020-09-21 LAB — MAGNESIUM: Magnesium: 1.8 mg/dL (ref 1.7–2.4)

## 2020-09-21 LAB — HEPATITIS B SURFACE ANTIBODY,QUALITATIVE: Hep B S Ab: NONREACTIVE

## 2020-09-21 LAB — TRIGLYCERIDES: Triglycerides: 100 mg/dL (ref <150)

## 2020-09-21 LAB — HIV ANTIBODY (ROUTINE TESTING W REFLEX): HIV Screen 4th Generation wRfx: NONREACTIVE

## 2020-09-21 LAB — HEPATITIS C ANTIBODY: HCV Ab: NONREACTIVE

## 2020-09-21 MED ORDER — POTASSIUM CHLORIDE 10 MEQ/50ML IV SOLN
10.0000 meq | INTRAVENOUS | Status: AC
  Administered 2020-09-21 (×4): 10 meq via INTRAVENOUS
  Filled 2020-09-21 (×5): qty 50

## 2020-09-21 MED ORDER — POTASSIUM CHLORIDE 20 MEQ PO PACK
20.0000 meq | PACK | ORAL | Status: DC
Start: 2020-09-21 — End: 2020-09-21

## 2020-09-21 MED ORDER — MAGNESIUM SULFATE 2 GM/50ML IV SOLN
2.0000 g | Freq: Once | INTRAVENOUS | Status: AC
Start: 2020-09-21 — End: 2020-09-21
  Administered 2020-09-21: 2 g via INTRAVENOUS
  Filled 2020-09-21: qty 50

## 2020-09-21 MED ORDER — SODIUM CHLORIDE 0.9 % IV SOLN
3.0000 g | Freq: Three times a day (TID) | INTRAVENOUS | Status: DC
  Administered 2020-09-21 – 2020-09-22 (×3): 3 g via INTRAVENOUS
  Filled 2020-09-21 (×3): qty 8

## 2020-09-21 MED ORDER — FOLIC ACID 1 MG PO TABS
1.0000 mg | ORAL_TABLET | Freq: Every day | ORAL | Status: DC
  Administered 2020-09-21 – 2020-09-23 (×3): 1 mg via ORAL
  Filled 2020-09-21 (×3): qty 1

## 2020-09-21 MED ORDER — PANTOPRAZOLE SODIUM 40 MG PO TBEC: 40.0000 mg | DELAYED_RELEASE_TABLET | Freq: Two times a day (BID) | ORAL | Status: DC

## 2020-09-21 MED ORDER — ADULT MULTIVITAMIN W/MINERALS CH
1.0000 | ORAL_TABLET | Freq: Every day | ORAL | Status: DC
  Administered 2020-09-21 – 2020-09-23 (×3): 1 via ORAL
  Filled 2020-09-21 (×3): qty 1

## 2020-09-21 NOTE — Progress Notes (Signed)
RT NOTE: attempted SBT on patient this AM on CPAP/PSV of 15/5 however patient went apneic and backup ventilation alarmed.  Placed patient back on full support settings and is tolerating well at this time.  RN currently working on weaning sedation.  RT will attempt again once patient is more awake.  Will continue to monitor.
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
## 2020-09-21 NOTE — Progress Notes (Signed)
Pharmacy Antibiotic Note  Jimmy [name] is a 65 y.o. male admitted on 09/20/2020 with hematemesis.  Pharmacy has been consulted for Unasyn dosing for aspiration PNA. Low temps. WBC 10.2. Scr stable.   Plan: Start Unasyn 3g q8h  Monitor renal function, cultures, and clinical progression  Height: 5\' 8"  (172.7 cm) Weight: 68 kg (149 lb 14.6 oz) IBW/kg (Calculated) : 68.4  Temp (24hrs), Avg:98.2 F (36.8 C), Min:97.5 F (36.4 C), Max:99 F (37.2 C)  Recent Labs  Lab 09/20/20 0334 09/20/20 2332 09/21/20 0436  WBC 7.7 9.7 10.2  CREATININE 0.81  --  0.60*    Estimated Creatinine Clearance: 90.9 mL/min (A) (by C-G formula based on SCr of 0.6 mg/dL (L)).    Allergies  Allergen Reactions   Neosporin [Neomycin-Bacitracin Zn-Polymyx] Rash    Antimicrobials this admission: Unasyn 7/1 >>   Dose adjustments this admission:   Microbiology results: 7/1 TA:   Thank you for allowing pharmacy to be a part of this patient's care.  9/1, PharmD Clinical Pharmacist  09/21/2020 9:49 AM
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
## 2020-09-21 NOTE — Progress Notes (Addendum)
Daily Rounding Note  09/21/2020, 1:54 PM  LOS: 1 day   SUBJECTIVE:   Chief complaint:   Bleeding gastric ulcer.  GDA embolization.  Patient is feeling pretty good.  Has not had any stools today.  No nausea or vomiting.  OBJECTIVE:         Vital signs in last 24 hours:    Temp:  [97.5 F (36.4 C)-99 F (37.2 C)] 98.8 F (37.1 C) (07/01 1130) Pulse Rate:  [59-101] 97 (07/01 1145) Resp:  [14-21] 18 (07/01 1145) BP: (98-175)/(51-92) 127/64 (07/01 1100) SpO2:  [98 %-100 %] 98 % (07/01 1145) Arterial Line BP: (115-177)/(48-83) 120/50 (07/01 0800) FiO2 (%):  [30 %-40 %] 30 % (07/01 0900) Last BM Date:  (Prior to admission) Filed Weights   09/20/20 0332 09/20/20 1050  Weight: 68 kg 68 kg   General: Does not look acutely ill but looks "road heart put up wet".  Alert and comfortable Heart: RRR. Chest: Labored breathing or cough Abdomen: Soft.  Not tender or distended. Extremities: No CCE. Neuro/Psych: Oriented x3.  Appropriate.  Intake/Output from previous day: 06/30 0701 - 07/01 0700 In: 1626.5 [I.V.:821.9; Blood:554.6; IV Piggyback:250] Out: 1610 [RUEAV:[id number] [Urine:2899; Emesis/NG output:350; Blood:300]  Intake/Output this shift: Total I/O In: 343.5 [P.O.:240; I.V.:30.4; IV Piggyback:73.1] Out: -   Lab Results: Recent Labs    09/20/20 0334 09/20/20 1309 09/20/20 1623 09/20/20 2332 09/21/20 0436  WBC 7.7  --   --  9.7 10.2  HGB 7.8*   < > 10.2* 11.0* 10.9*  HCT 22.5*   < > 30.0* 31.2* 31.6*  PLT 321  --   --  270 267   < > = values in this interval not displayed.   BMET Recent Labs    09/20/20 0334 09/20/20 1309 09/20/20 1623 09/21/20 0436  NA 132* 133* 134* 131*  K 3.2* 3.7 3.5 3.2*  CL 98  --   --  102  CO2 24  --   --  23  GLUCOSE 123*  --   --  97  BUN 25*  --   --  16  CREATININE 0.81  --   --  0.60*  CALCIUM 8.4*  --   --  8.3*   LFT Recent Labs    09/20/20 0334 09/21/20 0436  PROT 5.1* 5.2*   ALBUMIN 2.6* 2.7*  AST 18 14*  ALT 13 12  ALKPHOS 37* 37*  BILITOT 0.2* 1.0   PT/INR Recent Labs    09/20/20 0334  LABPROT 13.4  INR 1.0   Hepatitis Panel Recent Labs    09/21/20 0436  HEPBSAG NON REACTIVE  HCVAB NON REACTIVE    Studies/Results: DG Abd 1 View  Result Date: 09/20/2020 CLINICAL DATA:  Orogastric tube placement. EXAM: ABDOMEN - 1 VIEW COMPARISON:  December 28, 2015 FINDINGS: An orogastric tube is seen with its distal tip overlying the body of the stomach. Its distal side hole is approximately 4.5 cm distal to the gastroesophageal junction. The bowel gas pattern is normal. Radiopaque surgical clips and vascular coils are seen overlying the right upper quadrant. No radio-opaque calculi or other significant radiographic abnormality are seen. IMPRESSION: Orogastric tube positioning, as described above. Electronically Signed   By: Aram Candelahaddeus  Houston M.D.   On: 09/20/2020 18:19   IR Angiogram Visceral Selective  Result Date: 09/20/2020 INDICATION: Emergent angio EMBO for bleeding pyloric channel ulcer at endoscopy EXAM: ULTRASOUND GUIDANCE FOR VASCULAR ACCESS CELIAC AND GDA CATHETERIZATIONS AND ANGIOGRAMS  GDA MICRO COIL EMBOLIZATION MEDICATIONS: Anesthesia. ANESTHESIA/SEDATION: General anesthesia CONTRAST:  75 cc omni 300 FLUOROSCOPY TIME:  Fluoroscopy Time: 8 minutes 24 seconds (127 mGy). COMPLICATIONS: None immediate. PROCEDURE: Patient is unable to give consent and no available family members. Because of the active acute arterial bleeding pyloric channel ulcer, emergent consent is necessary. A time out was performed prior to the initiation of the procedure. Maximal barrier sterile technique utilized including caps, mask, sterile gowns, sterile gloves, large sterile drape, hand hygiene, and Betadine prep. Under sterile conditions and local anesthesia, ultrasound micropuncture access performed of the right common femoral artery. Five French sheath inserted over a guidewire. C2  catheter utilized to select the celiac artery. Celiac angiogram performed. Celiac: Celiac origin including its branches are widely patent. Specifically, the splenic, left gastric, common hepatic, gastroduodenal, and common hepatic vasculature all patent. No active bleeding on this initial angiogram. Over a Glidewire, the C2 catheter was advanced into the proximal GDA. GDA angiogram performed. GDA: GDA and gastroepiploic vasculature all patent as well as the hepatic vasculature. Proximal GDA spasm noted from catheter and guidewire access. No active arterial bleeding appreciated. Because of the location by endoscopy, preventative emergent GDA embolization will be performed. GDA micro coil embolization: Through the C2 catheter, a Renegade STC catheter was advanced more peripherally into the gastroduodenal artery. Repeat GDA angiogram performed. Measurements obtained for vessel diameter. GDA embolization successfully performed by deploying interlock IDC 2D fibered coils ranging in size from 6-12 mm and up to 30 cm in length. A total of 10 coils deployed successfully. Post embolization angiogram confirms stasis in the GDA vasculature. Access removed. Hemostasis obtained with the ExoSeal device. No immediate complication. Patient tolerated the procedure well. IMPRESSION: Successful GDA micro coil embolization for active bleeding pyloric channel ulcer endoscopically Electronically Signed   By: Judie Petit.  Shick M.D.   On: 09/20/2020 13:48   IR Angiogram Follow Up Study  Result Date: 09/20/2020 INDICATION: Emergent angio EMBO for bleeding pyloric channel ulcer at endoscopy EXAM: ULTRASOUND GUIDANCE FOR VASCULAR ACCESS CELIAC AND GDA CATHETERIZATIONS AND ANGIOGRAMS GDA MICRO COIL EMBOLIZATION MEDICATIONS: Anesthesia. ANESTHESIA/SEDATION: General anesthesia CONTRAST:  75 cc omni 300 FLUOROSCOPY TIME:  Fluoroscopy Time: 8 minutes 24 seconds (127 mGy). COMPLICATIONS: None immediate. PROCEDURE: Patient is unable to give consent and  no available family members. Because of the active acute arterial bleeding pyloric channel ulcer, emergent consent is necessary. A time out was performed prior to the initiation of the procedure. Maximal barrier sterile technique utilized including caps, mask, sterile gowns, sterile gloves, large sterile drape, hand hygiene, and Betadine prep. Under sterile conditions and local anesthesia, ultrasound micropuncture access performed of the right common femoral artery. Five French sheath inserted over a guidewire. C2 catheter utilized to select the celiac artery. Celiac angiogram performed. Celiac: Celiac origin including its branches are widely patent. Specifically, the splenic, left gastric, common hepatic, gastroduodenal, and common hepatic vasculature all patent. No active bleeding on this initial angiogram. Over a Glidewire, the C2 catheter was advanced into the proximal GDA. GDA angiogram performed. GDA: GDA and gastroepiploic vasculature all patent as well as the hepatic vasculature. Proximal GDA spasm noted from catheter and guidewire access. No active arterial bleeding appreciated. Because of the location by endoscopy, preventative emergent GDA embolization will be performed. GDA micro coil embolization: Through the C2 catheter, a Renegade STC catheter was advanced more peripherally into the gastroduodenal artery. Repeat GDA angiogram performed. Measurements obtained for vessel diameter. GDA embolization successfully performed by deploying interlock IDC 2D fibered coils  ranging in size from 6-12 mm and up to 30 cm in length. A total of 10 coils deployed successfully. Post embolization angiogram confirms stasis in the GDA vasculature. Access removed. Hemostasis obtained with the ExoSeal device. No immediate complication. Patient tolerated the procedure well. IMPRESSION: Successful GDA micro coil embolization for active bleeding pyloric channel ulcer endoscopically Electronically Signed   By: Judie Petit.  Shick M.D.    On: 09/20/2020 13:48   IR US Guide Vasc Access Right  Result Date: 09/20/2020 INDICATION: Emergent angio EMBO for bleeding pyloric channel ulcer at endoscopy EXAM: ULTRASOUND GUIDANCE FOR VASCULAR ACCESS CELIAC AND GDA CATHETERIZATIONS AND ANGIOGRAMS GDA MICRO COIL EMBOLIZATION MEDICATIONS: Anesthesia. ANESTHESIA/SEDATION: General anesthesia CONTRAST:  75 cc omni 300 FLUOROSCOPY TIME:  Fluoroscopy Time: 8 minutes 24 seconds (127 mGy). COMPLICATIONS: None immediate. PROCEDURE: Patient is unable to give consent and no available family members. Because of the active acute arterial bleeding pyloric channel ulcer, emergent consent is necessary. A time out was performed prior to the initiation of the procedure. Maximal barrier sterile technique utilized including caps, mask, sterile gowns, sterile gloves, large sterile drape, hand hygiene, and Betadine prep. Under sterile conditions and local anesthesia, ultrasound micropuncture access performed of the right common femoral artery. Five French sheath inserted over a guidewire. C2 catheter utilized to select the celiac artery. Celiac angiogram performed. Celiac: Celiac origin including its branches are widely patent. Specifically, the splenic, left gastric, common hepatic, gastroduodenal, and common hepatic vasculature all patent. No active bleeding on this initial angiogram. Over a Glidewire, the C2 catheter was advanced into the proximal GDA. GDA angiogram performed. GDA: GDA and gastroepiploic vasculature all patent as well as the hepatic vasculature. Proximal GDA spasm noted from catheter and guidewire access. No active arterial bleeding appreciated. Because of the location by endoscopy, preventative emergent GDA embolization will be performed. GDA micro coil embolization: Through the C2 catheter, a Renegade STC catheter was advanced more peripherally into the gastroduodenal artery. Repeat GDA angiogram performed. Measurements obtained for vessel diameter. GDA  embolization successfully performed by deploying interlock IDC 2D fibered coils ranging in size from 6-12 mm and up to 30 cm in length. A total of 10 coils deployed successfully. Post embolization angiogram confirms stasis in the GDA vasculature. Access removed. Hemostasis obtained with the ExoSeal device. No immediate complication. Patient tolerated the procedure well. IMPRESSION: Successful GDA micro coil embolization for active bleeding pyloric channel ulcer endoscopically Electronically Signed   By: Judie Petit.  Shick M.D.   On: 09/20/2020 13:48   DG CHEST PORT 1 VIEW  Result Date: 09/20/2020 CLINICAL DATA:  Hematemesis. EXAM: PORTABLE CHEST 1 VIEW COMPARISON:  February 12, 2018 FINDINGS: An endotracheal tube is seen with its distal tip approximately 6.8 cm from the carina. A right internal jugular venous catheter is noted. Its distal tip is seen overlying the mid superior vena cava. This is approximately 3.8 cm proximal to the junction of the superior vena cava and right atrium. Very mild atelectasis is seen within the bilateral lung bases. There is no evidence of a pleural effusion or pneumothorax. The heart size and mediastinal contours are within normal limits. The visualized skeletal structures are unremarkable. IMPRESSION: 1. Endotracheal tube and right internal jugular venous catheter positioning, as described above. 2. Very mild bibasilar atelectasis. Electronically Signed   By: Aram Candela M.D.   On: 09/20/2020 18:17   IR EMBO ART  VEN HEMORR LYMPH EXTRAV  INC GUIDE ROADMAPPING  Result Date: 09/20/2020 INDICATION: Emergent angio EMBO for bleeding pyloric channel ulcer  at endoscopy EXAM: ULTRASOUND GUIDANCE FOR VASCULAR ACCESS CELIAC AND GDA CATHETERIZATIONS AND ANGIOGRAMS GDA MICRO COIL EMBOLIZATION MEDICATIONS: Anesthesia. ANESTHESIA/SEDATION: General anesthesia CONTRAST:  75 cc omni 300 FLUOROSCOPY TIME:  Fluoroscopy Time: 8 minutes 24 seconds (127 mGy). COMPLICATIONS: None immediate. PROCEDURE:  Patient is unable to give consent and no available family members. Because of the active acute arterial bleeding pyloric channel ulcer, emergent consent is necessary. A time out was performed prior to the initiation of the procedure. Maximal barrier sterile technique utilized including caps, mask, sterile gowns, sterile gloves, large sterile drape, hand hygiene, and Betadine prep. Under sterile conditions and local anesthesia, ultrasound micropuncture access performed of the right common femoral artery. Five French sheath inserted over a guidewire. C2 catheter utilized to select the celiac artery. Celiac angiogram performed. Celiac: Celiac origin including its branches are widely patent. Specifically, the splenic, left gastric, common hepatic, gastroduodenal, and common hepatic vasculature all patent. No active bleeding on this initial angiogram. Over a Glidewire, the C2 catheter was advanced into the proximal GDA. GDA angiogram performed. GDA: GDA and gastroepiploic vasculature all patent as well as the hepatic vasculature. Proximal GDA spasm noted from catheter and guidewire access. No active arterial bleeding appreciated. Because of the location by endoscopy, preventative emergent GDA embolization will be performed. GDA micro coil embolization: Through the C2 catheter, a Renegade STC catheter was advanced more peripherally into the gastroduodenal artery. Repeat GDA angiogram performed. Measurements obtained for vessel diameter. GDA embolization successfully performed by deploying interlock IDC 2D fibered coils ranging in size from 6-12 mm and up to 30 cm in length. A total of 10 coils deployed successfully. Post embolization angiogram confirms stasis in the GDA vasculature. Access removed. Hemostasis obtained with the ExoSeal device. No immediate complication. Patient tolerated the procedure well. IMPRESSION: Successful GDA micro coil embolization for active bleeding pyloric channel ulcer endoscopically  Electronically Signed   By: Judie Petit.  Shick M.D.   On: 09/20/2020 13:48   US Abdomen Limited RUQ (LIVER/GB)  Result Date: 09/20/2020 CLINICAL DATA:  Cirrhosis.  Cholecystectomy. EXAM: ULTRASOUND ABDOMEN LIMITED RIGHT UPPER QUADRANT COMPARISON:  CT 12/13/2015. FINDINGS: Gallbladder: Cholecystectomy Common bile duct: Diameter: 7 mm Liver: Increased echogenicity. Slightly nodular hepatic contour. Findings suggest cirrhosis. No focal hepatic abnormality identified. Portal vein is patent on color Doppler imaging with normal direction of blood flow towards the liver. Other: None. IMPRESSION: 1. Cholecystectomy. Common bile duct is upper limits normal in caliber at 7 mm. This may be from prior cholecystectomy. 2. Increased hepatic echogenicity. Slightly nodular hepatic contour. Findings consistent with the patient's clinical diagnosis of cirrhosis. No focal hepatic abnormality identified. Electronically Signed   By: Maisie Fus  Register   On: 09/20/2020 08:43    ASSESMENT:      Upper GI bleed.   09/20/2020 EGD revealing severe, nonbleeding erosive esophagitis.  Gastric blood clot, actively bleeding gastric ulcer with visible vessel and large clot.  Unable to stop bleeding with cautery, hemostasis following chemo spray.  09/20/2020 gastroduodenal artery angiography and toil embolization.          Anemia due to GI blood loss.  Hgb 7.8 >> 3 PRBC >> 10.9.       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  He is an active alcoholic.  Advised pt to stop all ETOH.     PLAN      Finish 72 hours of Protonix drip then switch to Protonix 40 mg p.o.bid.      If doing well by tomorrow could advance to full  liquids and then to appropriate solid diet.     GI signing off.  Call us back if you need additional assistance.   GI follow up in several weeks.       Jennye Moccasin  09/21/2020, 1:54 PM Phone [phone]    Attending physician's note   I have taken an interval history, reviewed the chart and examined the patient. I agree  with the Advanced Practitioner's note, impression and recommendations.    Hemoglobin remained stable. 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  Advance diet as tolerated  Follow-up in GI office in 4 to 6 weeks.  Will need repeat EGD in 2 to 3 months to document healing of the pyloric channel ulcer and obtain repeat biopsies if needed  GI will sign off, is available if have any questions    The patient was provided an opportunity to ask questions and all were answered. The patient agreed with the plan and demonstrated an understanding of the instructions.  Iona Beard , MD [phone]

## 2020-09-21 NOTE — Progress Notes (Signed)
RT NOTE: sputum sample obtained and sent down to main lab without complications.  

## 2020-09-21 NOTE — Progress Notes (Signed)
eLink Physician-Brief Progress Note Patient Name: ADONYS WILDES DOB: 02/23/1956 MRN: [id number]   Date of Service  09/21/2020  HPI/Events of Note  K+ 3.2, Mg++ 1.8.  eICU Interventions  K+ and Mg++ replaced per E-Link adult electrolyte replacement protocol.        Thomasene Lot Hermann Dottavio 09/21/2020, 6:54 AM

## 2020-09-21 NOTE — Procedures (Signed)
Extubation Procedure Note  Patient Details:   Name: Jimmy [name] DOB: 1955-05-16 MRN: [id number]   Airway Documentation:    Vent end date: 09/21/20 Vent end time: 0935   Evaluation  O2 sats: stable throughout Complications: No apparent complications Patient did tolerate procedure well. Bilateral Breath Sounds: Clear   Yes  Patient extubated to 2L [place].  Positive cuff leak noted.  No evidence of stridor noted.  Patient able to speak post extubation.  Sats currently 99%.  Vitals are stable.  No complications noted at this time.   Elyn Peers 09/21/2020, 10:32 AM

## 2020-09-22 DIAGNOSIS — K7030 Alcoholic cirrhosis of liver without ascites: Secondary | ICD-10-CM

## 2020-09-22 LAB — COMPREHENSIVE METABOLIC PANEL
ALT: 10 U/L (ref 0–44)
AST: 16 U/L (ref 15–41)
Albumin: 2.6 g/dL — ABNORMAL LOW (ref 3.5–5.0)
Alkaline Phosphatase: 41 U/L (ref 38–126)
Anion gap: 8 (ref 5–15)
BUN: 10 mg/dL (ref 8–23)
CO2: 22 mmol/L (ref 22–32)
Calcium: 8 mg/dL — ABNORMAL LOW (ref 8.9–10.3)
Chloride: 97 mmol/L — ABNORMAL LOW (ref 98–111)
Creatinine, Ser: 0.72 mg/dL (ref 0.61–1.24)
GFR, Estimated: >60 mL/min (ref >60)
Glucose, Bld: 98 mg/dL (ref 70–99)
Potassium: 3.1 mmol/L — ABNORMAL LOW (ref 3.5–5.1)
Sodium: 127 mmol/L — ABNORMAL LOW (ref 135–145)
Total Bilirubin: 0.7 mg/dL (ref 0.3–1.2)
Total Protein: 5.4 g/dL — ABNORMAL LOW (ref 6.5–8.1)

## 2020-09-22 LAB — GLUCOSE, CAPILLARY
Glucose-Capillary: 88 mg/dL (ref 70–99)
Glucose-Capillary: 101 mg/dL — ABNORMAL HIGH (ref 70–99)

## 2020-09-22 LAB — CBC
HCT: 31.2 % — ABNORMAL LOW (ref 39.0–52.0)
Hemoglobin: 11 g/dL — ABNORMAL LOW (ref 13.0–17.0)
MCH: 32.4 pg (ref 26.0–34.0)
MCHC: 35.3 g/dL (ref 30.0–36.0)
MCV: 91.8 fL (ref 80.0–100.0)
Platelets: 259 10*3/uL (ref 150–400)
RBC: 3.4 MIL/uL — ABNORMAL LOW (ref 4.22–5.81)
RDW: 13.3 % (ref 11.5–15.5)
WBC: 11.6 10*3/uL — ABNORMAL HIGH (ref 4.0–10.5)
nRBC: 0 % (ref 0.0–0.2)

## 2020-09-22 LAB — MAGNESIUM: Magnesium: 1.7 mg/dL (ref 1.7–2.4)

## 2020-09-22 MED ORDER — AMOXICILLIN-POT CLAVULANATE 875-125 MG PO TABS
1.0000 | ORAL_TABLET | Freq: Two times a day (BID) | ORAL | Status: DC
  Administered 2020-09-22 – 2020-09-23 (×3): 1 via ORAL
  Filled 2020-09-22 (×4): qty 1

## 2020-09-22 MED ORDER — MAGNESIUM SULFATE 2 GM/50ML IV SOLN
2.0000 g | Freq: Once | INTRAVENOUS | Status: AC
  Administered 2020-09-22: 2 g via INTRAVENOUS
  Filled 2020-09-22: qty 50

## 2020-09-22 MED ORDER — POTASSIUM CHLORIDE CRYS ER 20 MEQ PO TBCR
30.0000 meq | EXTENDED_RELEASE_TABLET | ORAL | Status: AC
  Administered 2020-09-22 (×2): 30 meq via ORAL
  Filled 2020-09-22 (×2): qty 1

## 2020-09-22 MED ORDER — POTASSIUM CHLORIDE 10 MEQ/100ML IV SOLN
10.0000 meq | INTRAVENOUS | Status: DC
  Administered 2020-09-22 (×2): 10 meq via INTRAVENOUS
  Filled 2020-09-22 (×2): qty 100
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
## 2020-09-22 NOTE — Progress Notes (Signed)
Patient is ready for transfer to 2W bed 03. Gave report to [place], Charity fundraiser. Patient has all of his belongings and will transfer to unit via wheelchair.

## 2020-09-22 NOTE — Progress Notes (Signed)
PROGRESS NOTE   Jimmy [name]  HQI:[id number]    DOB: 1955/11/26    DOA: 09/20/2020  PCP: Patient, No Pcp Per (Inactive)   I have briefly reviewed patients previous medical records in [facility].  Chief Complaint  Patient presents with   GI Bleed    Brief Narrative:  PCCM to [facility] transfer 71/59:  65 year old homeless male, has not seen an MD in a long time, medical history significant for alcohol use disorder, tobacco use, bipolar disorder/schizophrenia, lap cholecystectomy, GERD, PUD who presented with acute UGIB, ABLA, unable to control via EGD maneuvers and eventually required embolization by ID.  Intubated for airway protection while management of UGIB, extubated.  Advancing diet.  Improved and stable   Assessment & Plan:  Principal Problem:   Acute upper GI bleed Active Problems:   Alcohol abuse   GIB (gastrointestinal bleeding)   Hypokalemia   Homeless   Nicotine dependence, cigarettes, uncomplicated   Acute blood loss anemia   Hyponatremia   Acute gastric ulcer with hemorrhage   Acute upper GI bleed due to large cratered pyloric channel ulcer: Intubated for airway protection.  GI consulted.  S/p EGD with spurting vessel not amenable to bipolar cautery, s/p chemo spray.  Eventually had GDA embolization by IR.  Per GI recommendation, complete 3 days of IV Protonix gtt then transition to twice daily Protonix, avoid NSAIDs, alcohol and tobacco, will need follow-up with GI in 4 to 6 weeks in the office and will need repeat EGD in 2 to 3 months to document healing of pyloric channel ulcer and obtain repeat biopsies if needed.  GI signed off 7/1.  Advancing diet today.  Stable for transfer to medical bed.  Acute blood loss anemia: Secondary to upper GI bleed.  S/p 2 units PRBC transfusion.  Hemoglobin stable.  Transfuse for hemoglobin 7 g or less.  Acute respiratory failure with hypoxia requiring mechanical ventilation: For airway management during management of GI bleed.   Extubated, stable and not hypoxic on room air.  Concern for aspiration pneumonia: Stable.  Transitioning from IV Unasyn to oral Augmentin to complete total 5 days course.  Per discussion with CCM MD today, did have a lot of secretions on tracheal aspirate.  Alcohol use disorder: Volunteers to drinking a sixpack beer daily.  Cessation counseled.  Continue thiamine, folate and multivitamins.  No overt withdrawal.  Tobacco use disorder: Cessation counseled.  Continue nicotine patch.  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  Alcohol abstinence counseled.  Hyponatremia: Likely due to cirrhosis, beer potomania.  Clinically euvolemic and no mental status changes.  Follow BMP in AM.  Hypokalemia: Replace aggressively and follow.  Replace magnesium  Homeless: TOC consulted for assistance but patient refuses to go to a shelter.  Reports he will stay with his brother or friend.  Body mass index is 22.79 kg/m.    DVT prophylaxis: SCDs Start: 09/20/20 1324     Code Status: Full Code Family Communication: None at bedside Disposition:  Status is: Inpatient  Remains inpatient appropriate because:IV treatments appropriate due to intensity of illness or inability to take PO  Dispo: The patient is from: Home              Anticipated d/c is to: Home              Patient currently is not medically stable to d/c.   Difficult to place patient No        Consultants:   PCCM [facility]  Procedures:   Intubated/extubated  EGD 09/20/2020:  Impression: - LA Grade D reflux and erosive esophagitis with no bleeding. - Large blood clot in the gastric fundus. - Partially obstructing spurting gastric ulcer with a visible vessel. Treatment not successful with bipolar cautery. Hemostatic spray applied, resulted in large clot covering the ulcer base. - Normal examined duodenum. - No specimens collected. Recommendations: - NPO. - Transfer patient to IR for GDA embolization - Continue  present medications. - PPI gtt for 72 hours and then switch to PPI twice daily - Monitor Hgb and transfuse to >7  09/20/2020: GDA angio and coil embolization for bleeding pyloric ulcer on EGD.  Antimicrobials:    Anti-infectives (From admission, onward)    Start     Dose/Rate Route Frequency Ordered Stop   09/22/20 1000  amoxicillin-clavulanate (AUGMENTIN) 875-125 MG per tablet 1 tablet        1 tablet Oral Every 12 hours 09/22/20 0749 09/26/20 0959   09/21/20 1030  Ampicillin-Sulbactam (UNASYN) 3 g in sodium chloride 0.9 % 100 mL IVPB  Status:  Discontinued        3 g 200 mL/hr over 30 Minutes Intravenous Every 8 hours 09/21/20 0952 09/22/20 0749         Subjective:  Denies complaints.  No abdominal pain, nausea or vomiting.  Reports no BM since hospital admission.  Titrated clear liquid diet.  Per RN, no acute issues.  Able to ambulate in the room.  Objective:   Vitals:   09/22/20 0400 09/22/20 0600 09/22/20 0700 09/22/20 0800  BP: (!) 109/52 (!) 90/52 (!) 82/48   Pulse: 81 74 73 88  Resp: 15 13 13    Temp:    98.4 F (36.9 C)  TempSrc:    Axillary  SpO2: 98% 92% 93% 92%  Weight:      Height:        General exam: Pleasant middle-aged male, moderately built and nourished lying comfortably propped up in bed without distress. Respiratory system: Clear to auscultation. Respiratory effort normal. Cardiovascular system: S1 & S2 heard, RRR. No JVD, murmurs, rubs, gallops or clicks. No pedal edema.  Telemetry personally reviewed: Sinus rhythm. Gastrointestinal system: Abdomen is nondistended, soft and nontender. No organomegaly or masses felt. Normal bowel sounds heard. Central nervous system: Alert and oriented. No focal neurological deficits. Extremities: Symmetric 5 x 5 power. Skin: No rashes, lesions or ulcers Psychiatry: Judgement and insight appear normal. Mood & affect appropriate.     Data Reviewed:   I have personally reviewed following labs and imaging  studies   CBC: Recent Labs  Lab 09/20/20 0334 09/20/20 1309 09/20/20 2332 09/21/20 0436 09/22/20 0241  WBC 7.7  --  9.7 10.2 11.6*  NEUTROABS 4.9  --   --   --   --   HGB 7.8*   < > 11.0* 10.9* 11.0*  HCT 22.5*   < > 31.2* 31.6* 31.2*  MCV 97.0  --  91.8 93.2 91.8  PLT 321  --  270 267 259   < > = values in this interval not displayed.    Basic Metabolic Panel: Recent Labs  Lab 09/20/20 0334 09/20/20 1309 09/20/20 1623 09/21/20 0436 09/22/20 0241 09/22/20 0616  NA 132*   < > 134* 131* 127*  --   K 3.2*   < > 3.5 3.2* 3.1*  --   CL 98  --   --  102 97*  --   CO2 24  --   --  23  22  --   GLUCOSE 123*  --   --  97 98  --   BUN 25*  --   --  16 10  --   CREATININE 0.81  --   --  0.60* 0.72  --   CALCIUM 8.4*  --   --  8.3* 8.0*  --   MG  --   --   --  1.8  --  1.7   < > = values in this interval not displayed.    Liver Function Tests: Recent Labs  Lab 09/20/20 0334 09/21/20 0436 09/22/20 0241  AST 18 14* 16  ALT 13 12 10   ALKPHOS 37* 37* 41  BILITOT 0.2* 1.0 0.7  PROT 5.1* 5.2* 5.4*  ALBUMIN 2.6* 2.7* 2.6*    CBG: Recent Labs  Lab 09/21/20 2314 09/22/20 0319 09/22/20 0737  GLUCAP 106* 88 101*    Microbiology Studies:   Recent Results (from the past 240 hour(s))  SARS CORONAVIRUS 2 (TAT 6-24 HRS) Nasopharyngeal Nasopharyngeal Swab     Status: None   Collection Time: 09/20/20  4:43 AM   Specimen: Nasopharyngeal Swab  Result Value Ref Range Status   SARS Coronavirus 2 NEGATIVE NEGATIVE Final    Comment: (NOTE) SARS-CoV-2 target nucleic acids are NOT DETECTED.  The SARS-CoV-2 RNA is generally detectable in upper and lower respiratory specimens during the acute phase of infection. Negative results do not preclude SARS-CoV-2 infection, do not rule out co-infections with other pathogens, and should not be used as the sole basis for treatment or other patient management decisions. Negative results must be combined with clinical  observations, patient history, and epidemiological information. The expected result is Negative.  Fact Sheet for Patients: SugarRoll.be  Fact Sheet for Healthcare Providers: https://www.woods-mathews.com/  This test is not yet approved or cleared by the [place] FDA and  has been authorized for detection and/or diagnosis of SARS-CoV-2 by FDA under an Emergency Use Authorization (EUA). This EUA will remain  in effect (meaning this test can be used) for the duration of the COVID-19 declaration under Se ction 564(b)(1) of the Act, 21 U.S.C. section 360bbb-3(b)(1), unless the authorization is terminated or revoked sooner.  Performed at [facility], [place] [address] [place], [place] [address]   MRSA Next Gen by PCR, Nasal     Status: None   Collection Time: 09/20/20  4:57 PM   Specimen: Nasal Mucosa; Nasal Swab  Result Value Ref Range Status   MRSA by PCR Next Gen NOT DETECTED NOT DETECTED Final    Comment: (NOTE) The GeneXpert MRSA Assay (FDA approved for NASAL specimens only), is one component of a comprehensive MRSA colonization surveillance program. It is not intended to diagnose MRSA infection nor to guide or monitor treatment for MRSA infections. Test performance is not FDA approved in patients less than 81 years old. Performed at [facility], [place] [address], [place], [place] [address]      Radiology Studies:  DG Abd 1 View  Result Date: 09/20/2020 CLINICAL DATA:  Orogastric tube placement. EXAM: ABDOMEN - 1 VIEW COMPARISON:  December 28, 2015 FINDINGS: An orogastric tube is seen with its distal tip overlying the body of the stomach. Its distal side hole is approximately 4.5 cm distal to the gastroesophageal junction. The bowel gas pattern is normal. Radiopaque surgical clips and vascular coils are seen overlying the right upper quadrant. No radio-opaque calculi or other significant radiographic abnormality are seen.  IMPRESSION: Orogastric tube positioning, as  described above. Electronically Signed   By: Virgina Norfolk M.D.   On: 09/20/2020 18:19   IR Angiogram Visceral Selective  Result Date: 09/20/2020 INDICATION: Emergent angio EMBO for bleeding pyloric channel ulcer at endoscopy EXAM: ULTRASOUND GUIDANCE FOR VASCULAR ACCESS CELIAC AND GDA CATHETERIZATIONS AND ANGIOGRAMS GDA MICRO COIL EMBOLIZATION MEDICATIONS: Anesthesia. ANESTHESIA/SEDATION: General anesthesia CONTRAST:  75 cc omni 300 FLUOROSCOPY TIME:  Fluoroscopy Time: 8 minutes 24 seconds (127 mGy). COMPLICATIONS: None immediate. PROCEDURE: Patient is unable to give consent and no available family members. Because of the active acute arterial bleeding pyloric channel ulcer, emergent consent is necessary. A time out was performed prior to the initiation of the procedure. Maximal barrier sterile technique utilized including caps, mask, sterile gowns, sterile gloves, large sterile drape, hand hygiene, and Betadine prep. Under sterile conditions and local anesthesia, ultrasound micropuncture access performed of the right common femoral artery. Five French sheath inserted over a guidewire. C2 catheter utilized to select the celiac artery. Celiac angiogram performed. Celiac: Celiac origin including its branches are widely patent. Specifically, the splenic, left gastric, common hepatic, gastroduodenal, and common hepatic vasculature all patent. No active bleeding on this initial angiogram. Over a Glidewire, the C2 catheter was advanced into the proximal GDA. GDA angiogram performed. GDA: GDA and gastroepiploic vasculature all patent as well as the hepatic vasculature. Proximal GDA spasm noted from catheter and guidewire access. No active arterial bleeding appreciated. Because of the location by endoscopy, preventative emergent GDA embolization will be performed. GDA micro coil embolization: Through the C2 catheter, a Renegade STC catheter was advanced more peripherally  into the gastroduodenal artery. Repeat GDA angiogram performed. Measurements obtained for vessel diameter. GDA embolization successfully performed by deploying interlock IDC 2D fibered coils ranging in size from 6-12 mm and up to 30 cm in length. A total of 10 coils deployed successfully. Post embolization angiogram confirms stasis in the GDA vasculature. Access removed. Hemostasis obtained with the ExoSeal device. No immediate complication. Patient tolerated the procedure well. IMPRESSION: Successful GDA micro coil embolization for active bleeding pyloric channel ulcer endoscopically Electronically Signed   By: Jerilynn Mages.  Shick M.D.   On: 09/20/2020 13:48   IR Angiogram Follow Up Study  Result Date: 09/20/2020 INDICATION: Emergent angio EMBO for bleeding pyloric channel ulcer at endoscopy EXAM: ULTRASOUND GUIDANCE FOR VASCULAR ACCESS CELIAC AND GDA CATHETERIZATIONS AND ANGIOGRAMS GDA MICRO COIL EMBOLIZATION MEDICATIONS: Anesthesia. ANESTHESIA/SEDATION: General anesthesia CONTRAST:  75 cc omni 300 FLUOROSCOPY TIME:  Fluoroscopy Time: 8 minutes 24 seconds (127 mGy). COMPLICATIONS: None immediate. PROCEDURE: Patient is unable to give consent and no available family members. Because of the active acute arterial bleeding pyloric channel ulcer, emergent consent is necessary. A time out was performed prior to the initiation of the procedure. Maximal barrier sterile technique utilized including caps, mask, sterile gowns, sterile gloves, large sterile drape, hand hygiene, and Betadine prep. Under sterile conditions and local anesthesia, ultrasound micropuncture access performed of the right common femoral artery. Five French sheath inserted over a guidewire. C2 catheter utilized to select the celiac artery. Celiac angiogram performed. Celiac: Celiac origin including its branches are widely patent. Specifically, the splenic, left gastric, common hepatic, gastroduodenal, and common hepatic vasculature all patent. No active  bleeding on this initial angiogram. Over a Glidewire, the C2 catheter was advanced into the proximal GDA. GDA angiogram performed. GDA: GDA and gastroepiploic vasculature all patent as well as the hepatic vasculature. Proximal GDA spasm noted from catheter and guidewire access. No active arterial bleeding appreciated. Because of the location  by endoscopy, preventative emergent GDA embolization will be performed. GDA micro coil embolization: Through the C2 catheter, a Renegade STC catheter was advanced more peripherally into the gastroduodenal artery. Repeat GDA angiogram performed. Measurements obtained for vessel diameter. GDA embolization successfully performed by deploying interlock IDC 2D fibered coils ranging in size from 6-12 mm and up to 30 cm in length. A total of 10 coils deployed successfully. Post embolization angiogram confirms stasis in the GDA vasculature. Access removed. Hemostasis obtained with the ExoSeal device. No immediate complication. Patient tolerated the procedure well. IMPRESSION: Successful GDA micro coil embolization for active bleeding pyloric channel ulcer endoscopically Electronically Signed   By: Jerilynn Mages.  Shick M.D.   On: 09/20/2020 13:48   IR US Guide Vasc Access Right  Result Date: 09/20/2020 INDICATION: Emergent angio EMBO for bleeding pyloric channel ulcer at endoscopy EXAM: ULTRASOUND GUIDANCE FOR VASCULAR ACCESS CELIAC AND GDA CATHETERIZATIONS AND ANGIOGRAMS GDA MICRO COIL EMBOLIZATION MEDICATIONS: Anesthesia. ANESTHESIA/SEDATION: General anesthesia CONTRAST:  75 cc omni 300 FLUOROSCOPY TIME:  Fluoroscopy Time: 8 minutes 24 seconds (127 mGy). COMPLICATIONS: None immediate. PROCEDURE: Patient is unable to give consent and no available family members. Because of the active acute arterial bleeding pyloric channel ulcer, emergent consent is necessary. A time out was performed prior to the initiation of the procedure. Maximal barrier sterile technique utilized including caps, mask,  sterile gowns, sterile gloves, large sterile drape, hand hygiene, and Betadine prep. Under sterile conditions and local anesthesia, ultrasound micropuncture access performed of the right common femoral artery. Five French sheath inserted over a guidewire. C2 catheter utilized to select the celiac artery. Celiac angiogram performed. Celiac: Celiac origin including its branches are widely patent. Specifically, the splenic, left gastric, common hepatic, gastroduodenal, and common hepatic vasculature all patent. No active bleeding on this initial angiogram. Over a Glidewire, the C2 catheter was advanced into the proximal GDA. GDA angiogram performed. GDA: GDA and gastroepiploic vasculature all patent as well as the hepatic vasculature. Proximal GDA spasm noted from catheter and guidewire access. No active arterial bleeding appreciated. Because of the location by endoscopy, preventative emergent GDA embolization will be performed. GDA micro coil embolization: Through the C2 catheter, a Renegade STC catheter was advanced more peripherally into the gastroduodenal artery. Repeat GDA angiogram performed. Measurements obtained for vessel diameter. GDA embolization successfully performed by deploying interlock IDC 2D fibered coils ranging in size from 6-12 mm and up to 30 cm in length. A total of 10 coils deployed successfully. Post embolization angiogram confirms stasis in the GDA vasculature. Access removed. Hemostasis obtained with the ExoSeal device. No immediate complication. Patient tolerated the procedure well. IMPRESSION: Successful GDA micro coil embolization for active bleeding pyloric channel ulcer endoscopically Electronically Signed   By: Jerilynn Mages.  Shick M.D.   On: 09/20/2020 13:48   DG CHEST PORT 1 VIEW  Result Date: 09/20/2020 CLINICAL DATA:  Hematemesis. EXAM: PORTABLE CHEST 1 VIEW COMPARISON:  February 12, 2018 FINDINGS: An endotracheal tube is seen with its distal tip approximately 6.8 cm from the carina. A  right internal jugular venous catheter is noted. Its distal tip is seen overlying the mid superior vena cava. This is approximately 3.8 cm proximal to the junction of the superior vena cava and right atrium. Very mild atelectasis is seen within the bilateral lung bases. There is no evidence of a pleural effusion or pneumothorax. The heart size and mediastinal contours are within normal limits. The visualized skeletal structures are unremarkable. IMPRESSION: 1. Endotracheal tube and right internal jugular venous catheter positioning,  as described above. 2. Very mild bibasilar atelectasis. Electronically Signed   By: Virgina Norfolk M.D.   On: 09/20/2020 18:17   IR EMBO ART  VEN HEMORR LYMPH EXTRAV  INC GUIDE ROADMAPPING  Result Date: 09/20/2020 INDICATION: Emergent angio EMBO for bleeding pyloric channel ulcer at endoscopy EXAM: ULTRASOUND GUIDANCE FOR VASCULAR ACCESS CELIAC AND GDA CATHETERIZATIONS AND ANGIOGRAMS GDA MICRO COIL EMBOLIZATION MEDICATIONS: Anesthesia. ANESTHESIA/SEDATION: General anesthesia CONTRAST:  75 cc omni 300 FLUOROSCOPY TIME:  Fluoroscopy Time: 8 minutes 24 seconds (127 mGy). COMPLICATIONS: None immediate. PROCEDURE: Patient is unable to give consent and no available family members. Because of the active acute arterial bleeding pyloric channel ulcer, emergent consent is necessary. A time out was performed prior to the initiation of the procedure. Maximal barrier sterile technique utilized including caps, mask, sterile gowns, sterile gloves, large sterile drape, hand hygiene, and Betadine prep. Under sterile conditions and local anesthesia, ultrasound micropuncture access performed of the right common femoral artery. Five French sheath inserted over a guidewire. C2 catheter utilized to select the celiac artery. Celiac angiogram performed. Celiac: Celiac origin including its branches are widely patent. Specifically, the splenic, left gastric, common hepatic, gastroduodenal, and common  hepatic vasculature all patent. No active bleeding on this initial angiogram. Over a Glidewire, the C2 catheter was advanced into the proximal GDA. GDA angiogram performed. GDA: GDA and gastroepiploic vasculature all patent as well as the hepatic vasculature. Proximal GDA spasm noted from catheter and guidewire access. No active arterial bleeding appreciated. Because of the location by endoscopy, preventative emergent GDA embolization will be performed. GDA micro coil embolization: Through the C2 catheter, a Renegade STC catheter was advanced more peripherally into the gastroduodenal artery. Repeat GDA angiogram performed. Measurements obtained for vessel diameter. GDA embolization successfully performed by deploying interlock IDC 2D fibered coils ranging in size from 6-12 mm and up to 30 cm in length. A total of 10 coils deployed successfully. Post embolization angiogram confirms stasis in the GDA vasculature. Access removed. Hemostasis obtained with the ExoSeal device. No immediate complication. Patient tolerated the procedure well. IMPRESSION: Successful GDA micro coil embolization for active bleeding pyloric channel ulcer endoscopically Electronically Signed   By: Jerilynn Mages.  Shick M.D.   On: 09/20/2020 13:48     Scheduled Meds:    amoxicillin-clavulanate  1 tablet Oral Q12H   chlorhexidine gluconate (MEDLINE KIT)  15 mL Mouth Rinse BID   Chlorhexidine Gluconate Cloth  6 each Topical Daily   folic acid  1 mg Oral Daily   multivitamin with minerals  1 tablet Oral Daily   nicotine  21 mg Transdermal Daily   [START ON 09/23/2020] pantoprazole  40 mg Oral BID   potassium chloride  30 mEq Oral Q4H   thiamine  100 mg Oral Daily   Or   thiamine  100 mg Intravenous Daily    Continuous Infusions:    pantoprazole 8 mg/hr (09/21/20 1700)     LOS: 2 days     Vernell Leep, MD, [place], [facility]. [facility]    To contact the attending provider between 7A-7P or the covering provider during after hours  7P-7A, please log into the web site [email or web address] and access using universal [place] password for that web site. If you do not have the password, please call the hospital operator.  09/22/2020, 11:23 AM

## 2020-09-22 NOTE — Progress Notes (Signed)
Physical Therapy Treatment Patient Details Name: Jimmy [name] MRN: [id number] DOB: September 01, 1955 Today's Date: 09/22/2020    History of Present Illness Mr. 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 Extubated 7/1.    PT Comments    Pt doing very well with mobility and amb multiple laps around unit. No further PT needed.    Follow Up Recommendations  No PT follow up     Equipment Recommendations  None recommended by PT    Recommendations for Other Services       Precautions / Restrictions Precautions Precautions: None    Mobility  Bed Mobility               General bed mobility comments: Pt up in chair    Transfers Overall transfer level: Modified independent Equipment used: None Transfers: Sit to/from [employer] Transfers Sit to Stand: Modified independent (Device/Increase time)            Ambulation/Gait Ambulation/Gait assistance: Modified independent (Device/Increase time) Gait Distance (Feet): 475 Feet Assistive device: None Gait Pattern/deviations: WFL(Within Functional Limits) Gait velocity: normal Gait velocity interpretation: >4.37 ft/sec, indicative of normal walking speed General Gait Details: Steady gait. I managed IV pole to allow pt freedom of movement   Stairs             Wheelchair Mobility    Modified Rankin (Stroke Patients Only)       Balance Overall balance assessment: No apparent balance deficits (not formally assessed)                                          Cognition Arousal/Alertness: Awake/alert Behavior During Therapy: WFL for tasks assessed/performed Overall Cognitive Status: Within Functional Limits for tasks assessed                                 General Comments: For PT activities      Exercises      General Comments General comments (skin integrity,  edema, etc.): VSS on RA      Pertinent Vitals/Pain      Home Living                      Prior Function            PT Goals (current goals can now be found in the care plan section) Acute Rehab PT Goals Patient Stated Goal: plans to return to "sleeping on the ground" Progress towards PT goals: Goals met/education completed, patient discharged from PT    Frequency           PT Plan Current plan remains appropriate    Co-evaluation              AM-PAC PT "6 Clicks" Mobility   Outcome Measure  Help needed turning from your back to your side while in a flat bed without using bedrails?: None Help needed moving from lying on your back to sitting on the side of a flat bed without using bedrails?: None Help needed moving to and from a bed to a chair (including a wheelchair)?: None Help needed standing up from a chair using your arms (e.g., wheelchair or bedside chair)?: None Help needed to walk in  hospital room?: None Help needed climbing 3-5 steps with a railing? : None 6 Click Score: 24    End of Session   Activity Tolerance: Patient tolerated treatment well Patient left: in chair;with call bell/phone within reach;with chair alarm set Nurse Communication: Mobility status PT Visit Diagnosis: Other abnormalities of gait and mobility (R26.89)     Time: 1210-1222 PT Time Calculation (min) (ACUTE ONLY): 12 min  Charges:  $Gait Training: 8-22 mins                     [place] Pager [phone] Office [place] 09/22/2020, 12:45 PM

## 2020-09-23 LAB — TYPE AND SCREEN
ABO/RH(D): O POS
Antibody Screen: NEGATIVE
Unit division: 0
Unit division: 0
Unit division: 0
Unit division: 0
Unit division: 0
Unit division: 0

## 2020-09-23 LAB — BPAM RBC
Blood Product Expiration Date: 202207172359
Blood Product Expiration Date: 202207172359
Blood Product Expiration Date: 202207302359
Blood Product Expiration Date: 202207312359
Blood Product Expiration Date: 202208022359
Blood Product Expiration Date: 202208022359
ISSUE DATE / TIME: 202206300545
ISSUE DATE / TIME: 202206300856
ISSUE DATE / TIME: 202206301128
ISSUE DATE / TIME: 202207031345
Unit Type and Rh: 5100
Unit Type and Rh: 5100
Unit Type and Rh: 5100
Unit Type and Rh: 5100
Unit Type and Rh: 5100
Unit Type and Rh: 5100

## 2020-09-23 LAB — BASIC METABOLIC PANEL
Anion gap: 8 (ref 5–15)
BUN: 10 mg/dL (ref 8–23)
CO2: 21 mmol/L — ABNORMAL LOW (ref 22–32)
Calcium: 8.1 mg/dL — ABNORMAL LOW (ref 8.9–10.3)
Chloride: 102 mmol/L (ref 98–111)
Creatinine, Ser: 0.71 mg/dL (ref 0.61–1.24)
GFR, Estimated: >60 mL/min (ref >60)
Glucose, Bld: 93 mg/dL (ref 70–99)
Potassium: 3.9 mmol/L (ref 3.5–5.1)
Sodium: 131 mmol/L — ABNORMAL LOW (ref 135–145)

## 2020-09-23 LAB — CBC
HCT: 29.2 % — ABNORMAL LOW (ref 39.0–52.0)
Hemoglobin: 10.1 g/dL — ABNORMAL LOW (ref 13.0–17.0)
MCH: 32.3 pg (ref 26.0–34.0)
MCHC: 34.6 g/dL (ref 30.0–36.0)
MCV: 93.3 fL (ref 80.0–100.0)
Platelets: 283 10*3/uL (ref 150–400)
RBC: 3.13 MIL/uL — ABNORMAL LOW (ref 4.22–5.81)
RDW: 13.2 % (ref 11.5–15.5)
WBC: 8.6 10*3/uL (ref 4.0–10.5)
nRBC: 0 % (ref 0.0–0.2)

## 2020-09-23 LAB — CULTURE, RESPIRATORY W GRAM STAIN

## 2020-09-23 LAB — MAGNESIUM: Magnesium: 1.8 mg/dL (ref 1.7–2.4)

## 2020-09-23 MED ORDER — NICOTINE 21 MG/24HR TD PT24: 21.0000 mg | MEDICATED_PATCH | Freq: Every day | TRANSDERMAL | 0 refills | Status: DC

## 2020-09-23 MED ORDER — AMOXICILLIN-POT CLAVULANATE 875-125 MG PO TABS: 1.0000 | ORAL_TABLET | Freq: Two times a day (BID) | ORAL | 0 refills | Status: AC

## 2020-09-23 MED ORDER — THIAMINE HCL 100 MG PO TABS: 100.0000 mg | ORAL_TABLET | Freq: Every day | ORAL | 0 refills | Status: DC

## 2020-09-23 MED ORDER — PANTOPRAZOLE SODIUM 40 MG PO TBEC
40.0000 mg | DELAYED_RELEASE_TABLET | Freq: Two times a day (BID) | ORAL | Status: DC
  Administered 2020-09-23: 40 mg via ORAL
  Filled 2020-09-23: qty 1

## 2020-09-23 MED ORDER — FOLIC ACID 1 MG PO TABS: 1.0000 mg | ORAL_TABLET | Freq: Every day | ORAL | 0 refills | Status: DC

## 2020-09-23 MED ORDER — ADULT MULTIVITAMIN W/MINERALS CH: 1.0000 | ORAL_TABLET | Freq: Every day | ORAL | Status: DC

## 2020-09-23 MED ORDER — PANTOPRAZOLE SODIUM 40 MG PO TBEC: 40.0000 mg | DELAYED_RELEASE_TABLET | Freq: Two times a day (BID) | ORAL | 1 refills | Status: DC

## 2020-09-23 NOTE — Discharge Summary (Signed)
Physician Discharge Summary  Jimmy BrakemanJack L [name] ION:[id number]:[id number] DOB: [birth year]  PCP: Patient, No Pcp Per (Inactive)  Admitted from: Home Discharged to: Home  Admit date: 09/20/2020 Discharge date: 09/23/2020  Recommendations for Outpatient Follow-up:    Follow-up Information     Arnaldo NatalKennedy-Smith, Colleen M, NP Follow up on 10/24/2020.   Specialty: Gastroenterology Why: 10:30 AM appt w GI. Contact information: [address] [place] [address] [phone]         Placey, Chales AbrahamsMary Ann, NP. Go on 09/24/2020.   Why: [facility]) Go there Monday to follow up from hosiptal visit.  To have repeat labs (CBC and CMP) in the next 3 to 4 days. Contact information: [address] [place] [address] [phone]                  Home Health: None    Equipment/Devices: None    Discharge Condition: Improved and stable.   Code Status: Full Code Diet recommendation:  Discharge Diet Orders (From admission, onward)     Start     Ordered   09/23/20 0000  Diet - low sodium heart healthy        09/23/20 1136             Discharge Diagnoses:  Principal Problem:   Acute upper GI bleed Active Problems:   Alcohol abuse   GIB (gastrointestinal bleeding)   Hypokalemia   Homeless   Nicotine dependence, cigarettes, uncomplicated   Acute blood loss anemia   Hyponatremia   Acute gastric ulcer with hemorrhage   Brief Summary: 65 year old homeless male, has not seen an MD in a long time, medical history significant for alcohol use disorder, tobacco use, bipolar disorder/schizophrenia, lap cholecystectomy, GERD, PUD who presented with acute UGIB, ABLA, unable to control via EGD maneuvers and eventually required embolization by ID.  Intubated for airway protection while management of UGIB, extubated.       Assessment & Plan:    Acute upper GI bleed due to large cratered pyloric channel ulcer: Intubated for airway protection.  GI consulted.  S/p EGD  with spurting vessel not amenable to bipolar cautery, s/p chemo spray.  Eventually had GDA embolization by IR.  Per GI recommendation, completed 3 days of IV Protonix gtt and transitioned to twice daily Protonix, avoid NSAIDs, alcohol and tobacco, will need follow-up with GI in 4 to 6 weeks in the office and will need repeat EGD in 2 to 3 months to document healing of pyloric channel ulcer and obtain repeat biopsies if needed.  GI signed off 7/1.  Patient has tolerated regular diet.  Has had no BM since hospital admission.  Hemoglobin stable.  GI has already made a follow-up appointment.  TOC consulted and assisted patient with PCP needs, medication needs and a ride home.   Acute blood loss anemia: Secondary to upper GI bleed.  S/p 2 units PRBC transfusion.  Hemoglobin stable.  Follow CBC closely as outpatient.   Acute respiratory failure with hypoxia requiring mechanical ventilation: For airway management during management of GI bleed.  Extubated, stable and not hypoxic on room air.   Concern for aspiration pneumonia: Stable.  Transitioned from IV Unasyn to oral Augmentin to complete total 5 days course.  Abundant strep pneumonia identified on tracheal aspirate-susceptibility pending.  May consider repeating chest x-ray in 4 weeks to ensure resolution of pneumonia findings.   Alcohol use disorder: Volunteers to drinking a sixpack beer daily.  Cessation counseled.  Continue  thiamine, folate and multivitamins.  No overt withdrawal.   Tobacco use disorder: Cessation counseled.  Continue nicotine patch.   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  Alcohol abstinence counseled.   Hyponatremia: Likely due to cirrhosis, beer potomania.  Clinically euvolemic and no mental status changes.  Improved.   Hypokalemia: Replaced.  Magnesium 1.8.   Homeless: TOC consulted for assistance but patient refuses to go to a shelter.  Patient continued to refuse to go to a shelter.  Stated that all he needs is  a cab ride to go back to his tent where he and his brother live together.  In any event, I have requested TOC to discuss again with him.  Bipolar disorder/schizophrenia: Not on meds PTA.  No AVH, SI or HI reported.   Body mass index is 22.79 kg/m.         Consultants:   [facility]   Procedures:   Intubated/extubated   EGD 09/20/2020:  Impression: - LA Grade D reflux and erosive esophagitis with no bleeding. - Large blood clot in the gastric fundus. - Partially obstructing spurting gastric ulcer with a visible vessel. Treatment not successful with bipolar cautery. Hemostatic spray applied, resulted in large clot covering the ulcer base. - Normal examined duodenum. - No specimens collected. Recommendations: - NPO. - Transfer patient to IR for GDA embolization - Continue present medications. - PPI gtt for 72 hours and then switch to PPI twice daily - Monitor Hgb and transfuse to >7   09/20/2020: GDA angio and coil embolization for bleeding pyloric ulcer on EGD.    Discharge Instructions  Discharge Instructions     Call MD for:   Complete by: As directed    Vomiting blood or coffee ground colored material.  Passing blood or maroon-colored stools or black tarry stools.   Call MD for:  difficulty breathing, headache or visual disturbances   Complete by: As directed    Call MD for:  extreme fatigue   Complete by: As directed    Call MD for:  persistant dizziness or light-headedness   Complete by: As directed    Call MD for:  persistant nausea and vomiting   Complete by: As directed    Call MD for:  severe uncontrolled pain   Complete by: As directed    Diet - low sodium heart healthy   Complete by: As directed    Increase activity slowly   Complete by: As directed    No wound care   Complete by: As directed         Medication List     STOP taking these medications    calcium carbonate 750 MG chewable tablet Commonly known as: TUMS EX   Goodys Extra  Strength 500-325-65 MG Pack Generic drug: Aspirin-Acetaminophen-Caffeine   omeprazole 20 MG capsule Commonly known as: PRILOSEC   Potassium Chloride ER 20 MEQ Tbcr   sucralfate 1 g tablet Commonly known as: Carafate       TAKE these medications    amoxicillin-clavulanate 875-125 MG tablet Commonly known as: AUGMENTIN Take 1 tablet by mouth 2 (two) times daily for 3 days.   folic acid 1 MG tablet Commonly known as: FOLVITE Take 1 tablet (1 mg total) by mouth daily. Start taking on: September 24, 2020   multivitamin with minerals Tabs tablet Take 1 tablet by mouth daily. Start taking on: September 24, 2020   nicotine 21 mg/24hr patch Commonly known as: NICODERM CQ - dosed in mg/24 hours Place  1 patch (21 mg total) onto the skin daily.   pantoprazole 40 MG tablet Commonly known as: PROTONIX Take 1 tablet (40 mg total) by mouth 2 (two) times daily before a meal. What changed: when to take this   thiamine 100 MG tablet Take 1 tablet (100 mg total) by mouth daily. Start taking on: September 24, 2020       Allergies  Allergen Reactions   Neosporin [Neomycin-Bacitracin Zn-Polymyx] Rash      Procedures/Studies: DG Abd 1 View  Result Date: 09/20/2020 CLINICAL DATA:  Orogastric tube placement. EXAM: ABDOMEN - 1 VIEW COMPARISON:  December 28, 2015 FINDINGS: An orogastric tube is seen with its distal tip overlying the body of the stomach. Its distal side hole is approximately 4.5 cm distal to the gastroesophageal junction. The bowel gas pattern is normal. Radiopaque surgical clips and vascular coils are seen overlying the right upper quadrant. No radio-opaque calculi or other significant radiographic abnormality are seen. IMPRESSION: Orogastric tube positioning, as described above. Electronically Signed   By: Aram Candela M.D.   On: 09/20/2020 18:19   IR Angiogram Visceral Selective  Result Date: 09/20/2020 INDICATION: Emergent angio EMBO for bleeding pyloric channel ulcer at endoscopy  EXAM: ULTRASOUND GUIDANCE FOR VASCULAR ACCESS CELIAC AND GDA CATHETERIZATIONS AND ANGIOGRAMS GDA MICRO COIL EMBOLIZATION MEDICATIONS: Anesthesia. ANESTHESIA/SEDATION: General anesthesia CONTRAST:  75 cc omni 300 FLUOROSCOPY TIME:  Fluoroscopy Time: 8 minutes 24 seconds (127 mGy). COMPLICATIONS: None immediate. PROCEDURE: Patient is unable to give consent and no available family members. Because of the active acute arterial bleeding pyloric channel ulcer, emergent consent is necessary. A time out was performed prior to the initiation of the procedure. Maximal barrier sterile technique utilized including caps, mask, sterile gowns, sterile gloves, large sterile drape, hand hygiene, and Betadine prep. Under sterile conditions and local anesthesia, ultrasound micropuncture access performed of the right common femoral artery. Five French sheath inserted over a guidewire. C2 catheter utilized to select the celiac artery. Celiac angiogram performed. Celiac: Celiac origin including its branches are widely patent. Specifically, the splenic, left gastric, common hepatic, gastroduodenal, and common hepatic vasculature all patent. No active bleeding on this initial angiogram. Over a Glidewire, the C2 catheter was advanced into the proximal GDA. GDA angiogram performed. GDA: GDA and gastroepiploic vasculature all patent as well as the hepatic vasculature. Proximal GDA spasm noted from catheter and guidewire access. No active arterial bleeding appreciated. Because of the location by endoscopy, preventative emergent GDA embolization will be performed. GDA micro coil embolization: Through the C2 catheter, a Renegade STC catheter was advanced more peripherally into the gastroduodenal artery. Repeat GDA angiogram performed. Measurements obtained for vessel diameter. GDA embolization successfully performed by deploying interlock IDC 2D fibered coils ranging in size from 6-12 mm and up to 30 cm in length. A total of 10 coils deployed  successfully. Post embolization angiogram confirms stasis in the GDA vasculature. Access removed. Hemostasis obtained with the ExoSeal device. No immediate complication. Patient tolerated the procedure well. IMPRESSION: Successful GDA micro coil embolization for active bleeding pyloric channel ulcer endoscopically Electronically Signed   By: Judie Petit.  Shick M.D.   On: 09/20/2020 13:48   IR Angiogram Follow Up Study  Result Date: 09/20/2020 INDICATION: Emergent angio EMBO for bleeding pyloric channel ulcer at endoscopy EXAM: ULTRASOUND GUIDANCE FOR VASCULAR ACCESS CELIAC AND GDA CATHETERIZATIONS AND ANGIOGRAMS GDA MICRO COIL EMBOLIZATION MEDICATIONS: Anesthesia. ANESTHESIA/SEDATION: General anesthesia CONTRAST:  75 cc omni 300 FLUOROSCOPY TIME:  Fluoroscopy Time: 8 minutes 24 seconds (127 mGy). COMPLICATIONS:  None immediate. PROCEDURE: Patient is unable to give consent and no available family members. Because of the active acute arterial bleeding pyloric channel ulcer, emergent consent is necessary. A time out was performed prior to the initiation of the procedure. Maximal barrier sterile technique utilized including caps, mask, sterile gowns, sterile gloves, large sterile drape, hand hygiene, and Betadine prep. Under sterile conditions and local anesthesia, ultrasound micropuncture access performed of the right common femoral artery. Five French sheath inserted over a guidewire. C2 catheter utilized to select the celiac artery. Celiac angiogram performed. Celiac: Celiac origin including its branches are widely patent. Specifically, the splenic, left gastric, common hepatic, gastroduodenal, and common hepatic vasculature all patent. No active bleeding on this initial angiogram. Over a Glidewire, the C2 catheter was advanced into the proximal GDA. GDA angiogram performed. GDA: GDA and gastroepiploic vasculature all patent as well as the hepatic vasculature. Proximal GDA spasm noted from catheter and guidewire access. No  active arterial bleeding appreciated. Because of the location by endoscopy, preventative emergent GDA embolization will be performed. GDA micro coil embolization: Through the C2 catheter, a Renegade STC catheter was advanced more peripherally into the gastroduodenal artery. Repeat GDA angiogram performed. Measurements obtained for vessel diameter. GDA embolization successfully performed by deploying interlock IDC 2D fibered coils ranging in size from 6-12 mm and up to 30 cm in length. A total of 10 coils deployed successfully. Post embolization angiogram confirms stasis in the GDA vasculature. Access removed. Hemostasis obtained with the ExoSeal device. No immediate complication. Patient tolerated the procedure well. IMPRESSION: Successful GDA micro coil embolization for active bleeding pyloric channel ulcer endoscopically Electronically Signed   By: Judie Petit.  Shick M.D.   On: 09/20/2020 13:48   IR US Guide Vasc Access Right  Result Date: 09/20/2020 INDICATION: Emergent angio EMBO for bleeding pyloric channel ulcer at endoscopy EXAM: ULTRASOUND GUIDANCE FOR VASCULAR ACCESS CELIAC AND GDA CATHETERIZATIONS AND ANGIOGRAMS GDA MICRO COIL EMBOLIZATION MEDICATIONS: Anesthesia. ANESTHESIA/SEDATION: General anesthesia CONTRAST:  75 cc omni 300 FLUOROSCOPY TIME:  Fluoroscopy Time: 8 minutes 24 seconds (127 mGy). COMPLICATIONS: None immediate. PROCEDURE: Patient is unable to give consent and no available family members. Because of the active acute arterial bleeding pyloric channel ulcer, emergent consent is necessary. A time out was performed prior to the initiation of the procedure. Maximal barrier sterile technique utilized including caps, mask, sterile gowns, sterile gloves, large sterile drape, hand hygiene, and Betadine prep. Under sterile conditions and local anesthesia, ultrasound micropuncture access performed of the right common femoral artery. Five French sheath inserted over a guidewire. C2 catheter utilized to  select the celiac artery. Celiac angiogram performed. Celiac: Celiac origin including its branches are widely patent. Specifically, the splenic, left gastric, common hepatic, gastroduodenal, and common hepatic vasculature all patent. No active bleeding on this initial angiogram. Over a Glidewire, the C2 catheter was advanced into the proximal GDA. GDA angiogram performed. GDA: GDA and gastroepiploic vasculature all patent as well as the hepatic vasculature. Proximal GDA spasm noted from catheter and guidewire access. No active arterial bleeding appreciated. Because of the location by endoscopy, preventative emergent GDA embolization will be performed. GDA micro coil embolization: Through the C2 catheter, a Renegade STC catheter was advanced more peripherally into the gastroduodenal artery. Repeat GDA angiogram performed. Measurements obtained for vessel diameter. GDA embolization successfully performed by deploying interlock IDC 2D fibered coils ranging in size from 6-12 mm and up to 30 cm in length. A total of 10 coils deployed successfully. Post embolization angiogram confirms stasis in  the GDA vasculature. Access removed. Hemostasis obtained with the ExoSeal device. No immediate complication. Patient tolerated the procedure well. IMPRESSION: Successful GDA micro coil embolization for active bleeding pyloric channel ulcer endoscopically Electronically Signed   By: Judie Petit.  Shick M.D.   On: 09/20/2020 13:48   DG CHEST PORT 1 VIEW  Result Date: 09/20/2020 CLINICAL DATA:  Hematemesis. EXAM: PORTABLE CHEST 1 VIEW COMPARISON:  February 12, 2018 FINDINGS: An endotracheal tube is seen with its distal tip approximately 6.8 cm from the carina. A right internal jugular venous catheter is noted. Its distal tip is seen overlying the mid superior vena cava. This is approximately 3.8 cm proximal to the junction of the superior vena cava and right atrium. Very mild atelectasis is seen within the bilateral lung bases. There is no  evidence of a pleural effusion or pneumothorax. The heart size and mediastinal contours are within normal limits. The visualized skeletal structures are unremarkable. IMPRESSION: 1. Endotracheal tube and right internal jugular venous catheter positioning, as described above. 2. Very mild bibasilar atelectasis. Electronically Signed   By: Aram Candela M.D.   On: 09/20/2020 18:17   IR EMBO ART  VEN HEMORR LYMPH EXTRAV  INC GUIDE ROADMAPPING  Result Date: 09/20/2020 INDICATION: Emergent angio EMBO for bleeding pyloric channel ulcer at endoscopy EXAM: ULTRASOUND GUIDANCE FOR VASCULAR ACCESS CELIAC AND GDA CATHETERIZATIONS AND ANGIOGRAMS GDA MICRO COIL EMBOLIZATION MEDICATIONS: Anesthesia. ANESTHESIA/SEDATION: General anesthesia CONTRAST:  75 cc omni 300 FLUOROSCOPY TIME:  Fluoroscopy Time: 8 minutes 24 seconds (127 mGy). COMPLICATIONS: None immediate. PROCEDURE: Patient is unable to give consent and no available family members. Because of the active acute arterial bleeding pyloric channel ulcer, emergent consent is necessary. A time out was performed prior to the initiation of the procedure. Maximal barrier sterile technique utilized including caps, mask, sterile gowns, sterile gloves, large sterile drape, hand hygiene, and Betadine prep. Under sterile conditions and local anesthesia, ultrasound micropuncture access performed of the right common femoral artery. Five French sheath inserted over a guidewire. C2 catheter utilized to select the celiac artery. Celiac angiogram performed. Celiac: Celiac origin including its branches are widely patent. Specifically, the splenic, left gastric, common hepatic, gastroduodenal, and common hepatic vasculature all patent. No active bleeding on this initial angiogram. Over a Glidewire, the C2 catheter was advanced into the proximal GDA. GDA angiogram performed. GDA: GDA and gastroepiploic vasculature all patent as well as the hepatic vasculature. Proximal GDA spasm noted  from catheter and guidewire access. No active arterial bleeding appreciated. Because of the location by endoscopy, preventative emergent GDA embolization will be performed. GDA micro coil embolization: Through the C2 catheter, a Renegade STC catheter was advanced more peripherally into the gastroduodenal artery. Repeat GDA angiogram performed. Measurements obtained for vessel diameter. GDA embolization successfully performed by deploying interlock IDC 2D fibered coils ranging in size from 6-12 mm and up to 30 cm in length. A total of 10 coils deployed successfully. Post embolization angiogram confirms stasis in the GDA vasculature. Access removed. Hemostasis obtained with the ExoSeal device. No immediate complication. Patient tolerated the procedure well. IMPRESSION: Successful GDA micro coil embolization for active bleeding pyloric channel ulcer endoscopically Electronically Signed   By: Judie Petit.  Shick M.D.   On: 09/20/2020 13:48   US Abdomen Limited RUQ (LIVER/GB)  Result Date: 09/20/2020 CLINICAL DATA:  Cirrhosis.  Cholecystectomy. EXAM: ULTRASOUND ABDOMEN LIMITED RIGHT UPPER QUADRANT COMPARISON:  CT 12/13/2015. FINDINGS: Gallbladder: Cholecystectomy Common bile duct: Diameter: 7 mm Liver: Increased echogenicity. Slightly nodular hepatic contour. Findings suggest cirrhosis.  No focal hepatic abnormality identified. Portal vein is patent on color Doppler imaging with normal direction of blood flow towards the liver. Other: None. IMPRESSION: 1. Cholecystectomy. Common bile duct is upper limits normal in caliber at 7 mm. This may be from prior cholecystectomy. 2. Increased hepatic echogenicity. Slightly nodular hepatic contour. Findings consistent with the patient's clinical diagnosis of cirrhosis. No focal hepatic abnormality identified. Electronically Signed   By: Maisie Fus  Register   On: 09/20/2020 08:43      Subjective: Patient denies complaints.  No BM since hospital admission.  Tolerated regular diet.   Ambulated without dizziness or lightheadedness.  No abdominal pain, nausea or vomiting.  Discharge Exam:  Vitals:   09/22/20 1400 09/22/20 1523 09/22/20 2047 09/23/20 0447  BP: 117/64 (!) 115/57 (!) 121/54 (!) 103/51  Pulse: 83 82 77 73  Resp: 16  16 16   Temp:  98.3 F (36.8 C) 99 F (37.2 C) 98.1 F (36.7 C)  TempSrc:  Oral Oral Oral  SpO2: 96% 100% 99% 100%  Weight:      Height:        General exam: Pleasant middle-aged male, moderately built and nourished lying comfortably propped up in bed without distress. Respiratory system: Clear to auscultation. Respiratory effort normal. Cardiovascular system: S1 & S2 heard, RRR. No JVD, murmurs, rubs, gallops or clicks. No pedal edema.   Gastrointestinal system: Abdomen is nondistended, soft and nontender. No organomegaly or masses felt. Normal bowel sounds heard. Central nervous system: Alert and oriented. No focal neurological deficits. Extremities: Symmetric 5 x 5 power. Skin: No rashes, lesions or ulcers Psychiatry: Judgement and insight appear normal. Mood & affect appropriate.    The results of significant diagnostics from this hospitalization (including imaging, microbiology, ancillary and laboratory) are listed below for reference.     Microbiology: Recent Results (from the past 240 hour(s))  SARS CORONAVIRUS 2 (TAT 6-24 HRS) Nasopharyngeal Nasopharyngeal Swab     Status: None   Collection Time: 09/20/20  4:43 AM   Specimen: Nasopharyngeal Swab  Result Value Ref Range Status   SARS Coronavirus 2 NEGATIVE NEGATIVE Final    Comment: (NOTE) SARS-CoV-2 target nucleic acids are NOT DETECTED.  The SARS-CoV-2 RNA is generally detectable in upper and lower respiratory specimens during the acute phase of infection. Negative results do not preclude SARS-CoV-2 infection, do not rule out co-infections with other pathogens, and should not be used as the sole basis for treatment or other patient management decisions. Negative  results must be combined with clinical observations, patient history, and epidemiological information. The expected result is Negative.  Fact Sheet for Patients: 09/22/20  Fact Sheet for Healthcare Providers: HairSlick.no  This test is not yet approved or cleared by the quierodirigir.com FDA and  has been authorized for detection and/or diagnosis of SARS-CoV-2 by FDA under an Emergency Use Authorization (EUA). This EUA will remain  in effect (meaning this test can be used) for the duration of the COVID-19 declaration under Se ction 564(b)(1) of the Act, 21 U.S.C. section 360bbb-3(b)(1), unless the authorization is terminated or revoked sooner.  Performed at [facility], [address] [place], [place]   MRSA Next Gen by PCR, Nasal     Status: None   Collection Time: 09/20/20  4:57 PM   Specimen: Nasal Mucosa; Nasal Swab  Result Value Ref Range Status   MRSA by PCR Next Gen NOT DETECTED NOT DETECTED Final    Comment: (NOTE) The GeneXpert MRSA Assay (FDA approved for  NASAL specimens only), is one component of a comprehensive MRSA colonization surveillance program. It is not intended to diagnose MRSA infection nor to guide or monitor treatment for MRSA infections. Test performance is not FDA approved in patients less than 57 years old. Performed at [facility], [address], [place], [place] [address]   Culture, Respiratory w Gram Stain     Status: None (Preliminary result)   Collection Time: 09/21/20  9:04 AM   Specimen: Tracheal Aspirate; Respiratory  Result Value Ref Range Status   Specimen Description TRACHEAL ASPIRATE  Final   Special Requests NONE  Final   Gram Stain   Final    ABUNDANT WBC PRESENT, PREDOMINANTLY PMN MODERATE GRAM POSITIVE COCCI IN PAIRS AND CHAINS    Culture   Final    ABUNDANT STREPTOCOCCUS PNEUMONIAE SUSCEPTIBILITIES TO FOLLOW Performed at [facility], [address], [place], [place] [address]    Report Status PENDING  Incomplete     Labs: CBC: Recent Labs  Lab 09/20/20 0334 09/20/20 1309 09/20/20 1623 09/20/20 2332 09/21/20 0436 09/22/20 0241 09/23/20 0301  WBC 7.7  --   --  9.7 10.2 11.6* 8.6  NEUTROABS 4.9  --   --   --   --   --   --   HGB 7.8*   < > 10.2* 11.0* 10.9* 11.0* 10.1*  HCT 22.5*   < > 30.0* 31.2* 31.6* 31.2* 29.2*  MCV 97.0  --   --  91.8 93.2 91.8 93.3  PLT 321  --   --  270 267 259 283   < > = values in this interval not displayed.    Basic Metabolic Panel: Recent Labs  Lab 09/20/20 0334 09/20/20 1309 09/20/20 1623 09/21/20 0436 09/22/20 0241 09/22/20 0616 09/23/20 0301  NA 132* 133* 134* 131* 127*  --  131*  K 3.2* 3.7 3.5 3.2* 3.1*  --  3.9  CL 98  --   --  102 97*  --  102  CO2 24  --   --  23 22  --  21*  GLUCOSE 123*  --   --  97 98  --  93  BUN 25*  --   --  16 10  --  10  CREATININE 0.81  --   --  0.60* 0.72  --  0.71  CALCIUM 8.4*  --   --  8.3* 8.0*  --  8.1*  MG  --   --   --  1.8  --  1.7 1.8    Liver Function Tests: Recent Labs  Lab 09/20/20 0334 09/21/20 0436 09/22/20 0241  AST 18 14* 16  ALT ALKPHOS 37* 37* 41  BILITOT 0.2* 1.0 0.7  PROT 5.1* 5.2* 5.4*  ALBUMIN 2.6* 2.7* 2.6*    CBG: Recent Labs  Lab 09/21/20 1520 09/21/20 1925 09/21/20 2314 09/22/20 0319 09/22/20 0737  GLUCAP 115* 109* 106* 88 101*    Lipid Profile Recent Labs    09/21/20 0436  TRIG 100     Time coordinating discharge: 35 minutes  SIGNED:  Marcellus Scott, MD, FACP, [facility]. [facility]  To contact the attending provider between 7A-7P or the covering provider during after hours 7P-7A, please log into the web site [email or web address] and access using universal [place] password for that web site. If you do not have the password, please call the hospital operator.

## 2020-09-23 NOTE — TOC Transition Note (Signed)
Transition of Care [facility]) - CM/SW Discharge Note   Patient Details  Name: Jimmy [name] MRN: [id number] Date of Birth: 07/02/1955  Transition of Care [facility]) CM/SW Contact:  Lawerance Sabal, RN Phone Number: 09/23/2020, 11:48 AM   Clinical Narrative:    Sherron Monday w patient at bedside. He plans to DC to his brother's tent. Address is behind [employer] at [address] [place], [place] [address].  The patient does not have a working phone number, he does not know his brother's phone number. Therefore in order to assure as best we can that he will follow up on getting his prescriptions he will be given a MATCH letter and a cab voucher to take [facility] to [facility] on [address], and a cab voucher to get [facility] to the [place] station. He states that the bus does not run from this area to the [place] station on Sundays.  He knows that he needs to follow up at the [facility], this is where he gets his mail.  The bus runs there on Monday, and IRC has been added to his AVS.      Final next level of care: Home/Self Care Barriers to Discharge: No Barriers Identified   Patient Goals and CMS Choice        Discharge Placement                       Discharge Plan and Services                                     Social Determinants of Health (SDOH) Interventions     Readmission Risk Interventions No flowsheet data found.

## 2020-09-23 NOTE — Discharge Instructions (Signed)

## 2020-09-26 LAB — H PYLORI, IGM, IGG, IGA AB
H Pylori IgG: 8.51 Index Value — ABNORMAL HIGH (ref 0.00–0.79)
H. Pylogi, Iga Abs: >35 units — ABNORMAL HIGH (ref 0.0–8.9)
H. Pylogi, Igm Abs: <9 units (ref 0.0–8.9)

## 2020-09-27 ENCOUNTER — Encounter (HOSPITAL_COMMUNITY): Payer: Self-pay

## 2020-09-27 ENCOUNTER — Other Ambulatory Visit: Payer: Self-pay

## 2020-09-27 ENCOUNTER — Emergency Department (HOSPITAL_COMMUNITY)
Admission: EM | Admit: 2020-09-27 | Discharge: 2020-09-28 | Disposition: A | Discharge status: Home / Self Care | Payer: Self-pay | Attending: Emergency Medicine | Admitting: Emergency Medicine

## 2020-09-27 DIAGNOSIS — R1012 Left upper quadrant pain: Secondary | ICD-10-CM | POA: Insufficient documentation

## 2020-09-27 DIAGNOSIS — R45850 Homicidal ideations: Secondary | ICD-10-CM | POA: Insufficient documentation

## 2020-09-27 DIAGNOSIS — Y900 Blood alcohol level of less than 20 mg/100 ml: Secondary | ICD-10-CM | POA: Insufficient documentation

## 2020-09-27 DIAGNOSIS — K219 Gastro-esophageal reflux disease without esophagitis: Secondary | ICD-10-CM | POA: Insufficient documentation

## 2020-09-27 DIAGNOSIS — Z8719 Personal history of other diseases of the digestive system: Secondary | ICD-10-CM | POA: Insufficient documentation

## 2020-09-27 DIAGNOSIS — R112 Nausea with vomiting, unspecified: Secondary | ICD-10-CM | POA: Insufficient documentation

## 2020-09-27 DIAGNOSIS — R1032 Left lower quadrant pain: Secondary | ICD-10-CM | POA: Insufficient documentation

## 2020-09-27 DIAGNOSIS — F17210 Nicotine dependence, cigarettes, uncomplicated: Secondary | ICD-10-CM | POA: Insufficient documentation

## 2020-09-27 NOTE — ED Triage Notes (Addendum)
Pt arrives EMS from the street with c/o LUQ abdominal pain and vomiting multiple episodes. Pt alert to self and situation only.

## 2020-09-28 ENCOUNTER — Encounter (HOSPITAL_COMMUNITY): Payer: Self-pay

## 2020-09-28 ENCOUNTER — Emergency Department (HOSPITAL_COMMUNITY): Payer: Self-pay

## 2020-09-28 LAB — URINALYSIS, ROUTINE W REFLEX MICROSCOPIC
Bacteria, UA: NONE SEEN
Bilirubin Urine: NEGATIVE
Glucose, UA: NEGATIVE mg/dL
Hgb urine dipstick: NEGATIVE
Ketones, ur: 5 mg/dL — AB
Leukocytes,Ua: NEGATIVE
Nitrite: NEGATIVE
Protein, ur: 30 mg/dL — AB
Specific Gravity, Urine: 1.021 (ref 1.005–1.030)
pH: 5 (ref 5.0–8.0)

## 2020-09-28 LAB — ETHANOL: Alcohol, Ethyl (B): <10 mg/dL (ref <10)

## 2020-09-28 LAB — CBC
HCT: 34.6 % — ABNORMAL LOW (ref 39.0–52.0)
Hemoglobin: 12.1 g/dL — ABNORMAL LOW (ref 13.0–17.0)
MCH: 32.4 pg (ref 26.0–34.0)
MCHC: 35 g/dL (ref 30.0–36.0)
MCV: 92.8 fL (ref 80.0–100.0)
Platelets: 486 10*3/uL — ABNORMAL HIGH (ref 150–400)
RBC: 3.73 MIL/uL — ABNORMAL LOW (ref 4.22–5.81)
RDW: 12.9 % (ref 11.5–15.5)
WBC: 7.9 10*3/uL (ref 4.0–10.5)
nRBC: 0 % (ref 0.0–0.2)

## 2020-09-28 LAB — RAPID URINE DRUG SCREEN, HOSP PERFORMED
Amphetamines: NOT DETECTED
Barbiturates: NOT DETECTED
Benzodiazepines: NOT DETECTED
Cocaine: POSITIVE — AB
Opiates: NOT DETECTED
Tetrahydrocannabinol: NOT DETECTED

## 2020-09-28 LAB — COMPREHENSIVE METABOLIC PANEL
ALT: 20 U/L (ref 0–44)
AST: 33 U/L (ref 15–41)
Albumin: 3.8 g/dL (ref 3.5–5.0)
Alkaline Phosphatase: 63 U/L (ref 38–126)
Anion gap: 12 (ref 5–15)
BUN: 14 mg/dL (ref 8–23)
CO2: 26 mmol/L (ref 22–32)
Calcium: 9.4 mg/dL (ref 8.9–10.3)
Chloride: 94 mmol/L — ABNORMAL LOW (ref 98–111)
Creatinine, Ser: 1.14 mg/dL (ref 0.61–1.24)
GFR, Estimated: >60 mL/min (ref >60)
Glucose, Bld: 116 mg/dL — ABNORMAL HIGH (ref 70–99)
Potassium: 3.4 mmol/L — ABNORMAL LOW (ref 3.5–5.1)
Sodium: 132 mmol/L — ABNORMAL LOW (ref 135–145)
Total Bilirubin: 0.7 mg/dL (ref 0.3–1.2)
Total Protein: 7.8 g/dL (ref 6.5–8.1)

## 2020-09-28 LAB — LIPASE, BLOOD: Lipase: 40 U/L (ref 11–51)

## 2020-09-28 LAB — TROPONIN I (HIGH SENSITIVITY): Troponin I (High Sensitivity): 8 ng/L (ref <18)

## 2020-09-28 LAB — POC OCCULT BLOOD, ED: Fecal Occult Bld: POSITIVE — AB

## 2020-09-28 MED ORDER — PANTOPRAZOLE SODIUM 40 MG IV SOLR
40.0000 mg | Freq: Once | INTRAVENOUS | Status: AC
  Administered 2020-09-28: 40 mg via INTRAVENOUS
  Filled 2020-09-28: qty 40

## 2020-09-28 MED ORDER — ONDANSETRON 4 MG PO TBDP: 4.0000 mg | ORAL_TABLET | Freq: Three times a day (TID) | ORAL | 0 refills | Status: DC | PRN

## 2020-09-28 MED ORDER — IOHEXOL 350 MG/ML SOLN
100.0000 mL | Freq: Once | INTRAVENOUS | Status: AC | PRN
  Administered 2020-09-28: 100 mL via INTRAVENOUS

## 2020-09-28 MED ORDER — SODIUM CHLORIDE (PF) 0.9 % IJ SOLN
INTRAMUSCULAR | Status: AC
  Filled 2020-09-28: qty 50

## 2020-09-28 MED ORDER — SODIUM CHLORIDE 0.9 % IV BOLUS
1000.0000 mL | Freq: Once | INTRAVENOUS | Status: AC
  Administered 2020-09-28: 1000 mL via INTRAVENOUS

## 2020-09-28 NOTE — ED Notes (Signed)
Pt brother called for pt transport home. No answer, voicemail left for pt brother
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
## 2020-09-28 NOTE — ED Notes (Signed)
Pt tolerated fluid challenge. Pt had fluids and a sandwich with no c/o nausea, vomiting, or abdominal pain

## 2020-09-28 NOTE — ED Notes (Signed)
Pt to CT via stretcher

## 2020-09-28 NOTE — Discharge Instructions (Addendum)
Take the Protonix as prescribed.  Avoid alcohol, caffeine, NSAID medications, spicy foods.  Follow-up with a gastroenterologist as scheduled. Return to the ED with new or worsening symptoms
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
## 2020-10-24 ENCOUNTER — Ambulatory Visit: Payer: Medicaid Other | Admitting: Nurse Practitioner

## 2021-06-21 ENCOUNTER — Other Ambulatory Visit: Payer: Self-pay

## 2021-06-21 ENCOUNTER — Encounter (HOSPITAL_COMMUNITY): Payer: Self-pay

## 2021-06-21 ENCOUNTER — Inpatient Hospital Stay (HOSPITAL_COMMUNITY)
Admission: EM | Admit: 2021-06-21 | Discharge: 2021-06-23 | DRG: 603 | Disposition: A | Discharge status: Home / Self Care | Payer: Self-pay | Attending: Family Medicine | Admitting: Family Medicine

## 2021-06-21 ENCOUNTER — Emergency Department (HOSPITAL_COMMUNITY): Payer: Self-pay

## 2021-06-21 DIAGNOSIS — F17210 Nicotine dependence, cigarettes, uncomplicated: Secondary | ICD-10-CM | POA: Diagnosis present

## 2021-06-21 DIAGNOSIS — Z881 Allergy status to other antibiotic agents status: Secondary | ICD-10-CM

## 2021-06-21 DIAGNOSIS — F319 Bipolar disorder, unspecified: Secondary | ICD-10-CM | POA: Diagnosis present

## 2021-06-21 DIAGNOSIS — M19072 Primary osteoarthritis, left ankle and foot: Secondary | ICD-10-CM | POA: Diagnosis present

## 2021-06-21 DIAGNOSIS — F209 Schizophrenia, unspecified: Secondary | ICD-10-CM | POA: Diagnosis present

## 2021-06-21 DIAGNOSIS — E871 Hypo-osmolality and hyponatremia: Secondary | ICD-10-CM | POA: Diagnosis present

## 2021-06-21 DIAGNOSIS — K219 Gastro-esophageal reflux disease without esophagitis: Secondary | ICD-10-CM | POA: Diagnosis present

## 2021-06-21 DIAGNOSIS — Z5900 Homelessness unspecified: Secondary | ICD-10-CM

## 2021-06-21 DIAGNOSIS — Z841 Family history of disorders of kidney and ureter: Secondary | ICD-10-CM

## 2021-06-21 DIAGNOSIS — Z79899 Other long term (current) drug therapy: Secondary | ICD-10-CM

## 2021-06-21 DIAGNOSIS — L03116 Cellulitis of left lower limb: Principal | ICD-10-CM | POA: Diagnosis present

## 2021-06-21 DIAGNOSIS — F1010 Alcohol abuse, uncomplicated: Secondary | ICD-10-CM | POA: Diagnosis present

## 2021-06-21 DIAGNOSIS — X31XXXA Exposure to excessive natural cold, initial encounter: Secondary | ICD-10-CM

## 2021-06-21 DIAGNOSIS — B353 Tinea pedis: Secondary | ICD-10-CM | POA: Diagnosis present

## 2021-06-21 DIAGNOSIS — T69029A Immersion foot, unspecified foot, initial encounter: Secondary | ICD-10-CM

## 2021-06-21 DIAGNOSIS — Z5902 Unsheltered homelessness: Secondary | ICD-10-CM

## 2021-06-21 DIAGNOSIS — M84378A Stress fracture, left toe(s), initial encounter for fracture: Secondary | ICD-10-CM | POA: Diagnosis present

## 2021-06-21 DIAGNOSIS — L8990 Pressure ulcer of unspecified site, unspecified stage: Secondary | ICD-10-CM | POA: Insufficient documentation

## 2021-06-21 DIAGNOSIS — Z811 Family history of alcohol abuse and dependence: Secondary | ICD-10-CM

## 2021-06-21 DIAGNOSIS — F1020 Alcohol dependence, uncomplicated: Secondary | ICD-10-CM | POA: Diagnosis present

## 2021-06-21 LAB — COMPREHENSIVE METABOLIC PANEL
ALT: 22 U/L (ref 0–44)
AST: 34 U/L (ref 15–41)
Albumin: 3.9 g/dL (ref 3.5–5.0)
Alkaline Phosphatase: 80 U/L (ref 38–126)
Anion gap: 11 (ref 5–15)
BUN: 6 mg/dL — ABNORMAL LOW (ref 8–23)
CO2: 21 mmol/L — ABNORMAL LOW (ref 22–32)
Calcium: 8.5 mg/dL — ABNORMAL LOW (ref 8.9–10.3)
Chloride: 96 mmol/L — ABNORMAL LOW (ref 98–111)
Creatinine, Ser: 0.67 mg/dL (ref 0.61–1.24)
GFR, Estimated: >60 mL/min (ref >60)
Glucose, Bld: 75 mg/dL (ref 70–99)
Potassium: 4.1 mmol/L (ref 3.5–5.1)
Sodium: 128 mmol/L — ABNORMAL LOW (ref 135–145)
Total Bilirubin: 0.3 mg/dL (ref 0.3–1.2)
Total Protein: 7.8 g/dL (ref 6.5–8.1)

## 2021-06-21 LAB — CBC WITH DIFFERENTIAL/PLATELET
Abs Immature Granulocytes: 0.03 10*3/uL (ref 0.00–0.07)
Basophils Absolute: 0.1 10*3/uL (ref 0.0–0.1)
Basophils Relative: 2 %
Eosinophils Absolute: 2 10*3/uL — ABNORMAL HIGH (ref 0.0–0.5)
Eosinophils Relative: 27 %
HCT: 37.8 % — ABNORMAL LOW (ref 39.0–52.0)
Hemoglobin: 13.2 g/dL (ref 13.0–17.0)
Immature Granulocytes: 0 %
Lymphocytes Relative: 24 %
Lymphs Abs: 1.8 10*3/uL (ref 0.7–4.0)
MCH: 33.2 pg (ref 26.0–34.0)
MCHC: 34.9 g/dL (ref 30.0–36.0)
MCV: 95 fL (ref 80.0–100.0)
Monocytes Absolute: 0.6 10*3/uL (ref 0.1–1.0)
Monocytes Relative: 8 %
Neutro Abs: 2.9 10*3/uL (ref 1.7–7.7)
Neutrophils Relative %: 39 %
Platelets: 303 10*3/uL (ref 150–400)
RBC: 3.98 MIL/uL — ABNORMAL LOW (ref 4.22–5.81)
RDW: 12.2 % (ref 11.5–15.5)
WBC: 7.4 10*3/uL (ref 4.0–10.5)
nRBC: 0 % (ref 0.0–0.2)

## 2021-06-21 LAB — LACTIC ACID, PLASMA: Lactic Acid, Venous: 2.3 mmol/L (ref 0.5–1.9)

## 2021-06-21 MED ORDER — SODIUM CHLORIDE 0.9 % IV SOLN
2.0000 g | Freq: Once | INTRAVENOUS | Status: AC
  Administered 2021-06-21: 2 g via INTRAVENOUS
  Filled 2021-06-21: qty 2

## 2021-06-21 MED ORDER — LORAZEPAM 2 MG/ML IJ SOLN
1.0000 mg | Freq: Once | INTRAMUSCULAR | Status: AC
  Administered 2021-06-22: 1 mg via INTRAVENOUS
  Filled 2021-06-21: qty 1

## 2021-06-21 MED ORDER — METRONIDAZOLE 500 MG/100ML IV SOLN
500.0000 mg | Freq: Two times a day (BID) | INTRAVENOUS | Status: DC
  Administered 2021-06-21 – 2021-06-22 (×2): 500 mg via INTRAVENOUS
  Filled 2021-06-21 (×2): qty 100

## 2021-06-21 MED ORDER — VANCOMYCIN HCL 1250 MG/250ML IV SOLN
1250.0000 mg | Freq: Once | INTRAVENOUS | Status: AC
  Administered 2021-06-22: 1250 mg via INTRAVENOUS
  Filled 2021-06-21: qty 250

## 2021-06-21 NOTE — ED Provider Triage Note (Signed)
Emergency Medicine Provider Triage Evaluation Note ? ?Jimmy [name] , a 66 y.o. male  was evaluated in triage.  Pt complains of left foot pain and swelling that he first noticed yesterday.  No treatment prior to arrival.  Patient denies IVDU but does have a documented history of alcohol use disorder.  Denies fever, chills, numbness and tingling ? ?Review of Systems  ?Positive:  ?Negative: As above ? ?Physical Exam  ?BP (!) 158/84 (BP Location: Left Arm)   Pulse 84   Temp (!) 97.3 ?F (36.3 ?C) (Oral)   Resp 18   Ht 5\' 8"  (1.727 m)   Wt 65.8 kg   SpO2 96%   BMI 22.05 kg/m?  ?Gen:   Awake, no distress   ?Resp:  Normal effort  ?MSK:   Moves extremities without difficulty; left foot grossly edematous and erythematous.  No obvious ulcerations or wound injuries.  Purulent discharge interdigitally. ?Other:   ? ?Medical Decision Making  ?Medically screening exam initiated at 7:05 PM.  Appropriate orders placed.  Jimmy [name] was informed that the remainder of the evaluation will be completed by another provider, this initial triage assessment does not replace that evaluation, and the importance of remaining in the ED until their evaluation is complete. ? ? ?  ?Jimmy [name], Jimmy [name] ?06/21/21 1908 ? ?

## 2021-06-21 NOTE — ED Triage Notes (Signed)
Patient reports that he has swelling and redness to the left foot that started today. ?

## 2021-06-21 NOTE — Progress Notes (Signed)
A consult was received from an ED physician for Cefepime + Vancomycin per pharmacy dosing.  The patient's profile has been reviewed for ht/wt/allergies/indication/available labs.   ?A one time order has been placed for Cefepime 2gm & Vancomcyin 1250mg  IV.  Further antibiotics/pharmacy consults should be ordered by admitting physician if indicated.       ?                ?Thank you, ? PharmD ?06/21/2021  9:36 PM ? ?

## 2021-06-22 ENCOUNTER — Emergency Department (HOSPITAL_COMMUNITY): Payer: Self-pay

## 2021-06-22 DIAGNOSIS — Z5900 Homelessness unspecified: Secondary | ICD-10-CM

## 2021-06-22 DIAGNOSIS — F17210 Nicotine dependence, cigarettes, uncomplicated: Secondary | ICD-10-CM

## 2021-06-22 DIAGNOSIS — F1020 Alcohol dependence, uncomplicated: Secondary | ICD-10-CM

## 2021-06-22 DIAGNOSIS — L8990 Pressure ulcer of unspecified site, unspecified stage: Secondary | ICD-10-CM | POA: Insufficient documentation

## 2021-06-22 DIAGNOSIS — L03116 Cellulitis of left lower limb: Principal | ICD-10-CM

## 2021-06-22 DIAGNOSIS — E871 Hypo-osmolality and hyponatremia: Secondary | ICD-10-CM

## 2021-06-22 LAB — MRSA NEXT GEN BY PCR, NASAL: MRSA by PCR Next Gen: DETECTED — AB

## 2021-06-22 LAB — ETHANOL: Alcohol, Ethyl (B): <10 mg/dL (ref <10)

## 2021-06-22 LAB — LACTIC ACID, PLASMA: Lactic Acid, Venous: 2.1 mmol/L (ref 0.5–1.9)

## 2021-06-22 MED ORDER — KETOROLAC TROMETHAMINE 30 MG/ML IJ SOLN
30.0000 mg | Freq: Once | INTRAMUSCULAR | Status: AC
  Administered 2021-06-22: 30 mg via INTRAVENOUS
  Filled 2021-06-22: qty 1

## 2021-06-22 MED ORDER — FOLIC ACID 1 MG PO TABS
1.0000 mg | ORAL_TABLET | Freq: Every day | ORAL | Status: DC
  Administered 2021-06-22 – 2021-06-23 (×2): 1 mg via ORAL
  Filled 2021-06-22 (×2): qty 1

## 2021-06-22 MED ORDER — ACETAMINOPHEN 650 MG RE SUPP: 650.0000 mg | Freq: Four times a day (QID) | RECTAL | Status: DC | PRN

## 2021-06-22 MED ORDER — ONDANSETRON HCL 4 MG/2ML IJ SOLN: 4.0000 mg | Freq: Four times a day (QID) | INTRAMUSCULAR | Status: DC | PRN

## 2021-06-22 MED ORDER — DOXYCYCLINE HYCLATE 100 MG PO TABS
100.0000 mg | ORAL_TABLET | Freq: Two times a day (BID) | ORAL | Status: DC
  Administered 2021-06-22 – 2021-06-23 (×2): 100 mg via ORAL
  Filled 2021-06-22 (×2): qty 1

## 2021-06-22 MED ORDER — LORAZEPAM 2 MG/ML IJ SOLN: 1.0000 mg | INTRAMUSCULAR | Status: DC | PRN

## 2021-06-22 MED ORDER — LORAZEPAM 1 MG PO TABS: 1.0000 mg | ORAL_TABLET | ORAL | Status: DC | PRN

## 2021-06-22 MED ORDER — TERBINAFINE HCL 1 % EX CREA
TOPICAL_CREAM | Freq: Two times a day (BID) | CUTANEOUS | Status: DC
  Administered 2021-06-23: 1 via TOPICAL
  Filled 2021-06-22 (×2): qty 12

## 2021-06-22 MED ORDER — THIAMINE HCL 100 MG/ML IJ SOLN
100.0000 mg | Freq: Every day | INTRAMUSCULAR | Status: DC
  Filled 2021-06-22: qty 2

## 2021-06-22 MED ORDER — ACETAMINOPHEN 325 MG PO TABS
650.0000 mg | ORAL_TABLET | Freq: Four times a day (QID) | ORAL | Status: DC | PRN
  Administered 2021-06-23: 650 mg via ORAL
  Filled 2021-06-22: qty 2

## 2021-06-22 MED ORDER — HYDROCORTISONE 1 % EX CREA
1.0000 "application " | TOPICAL_CREAM | Freq: Three times a day (TID) | CUTANEOUS | Status: DC | PRN
  Administered 2021-06-22 – 2021-06-23 (×2): 1 via TOPICAL
  Filled 2021-06-22: qty 28

## 2021-06-22 MED ORDER — SODIUM CHLORIDE 0.9 % IV SOLN
1.0000 g | INTRAVENOUS | Status: DC
  Administered 2021-06-22 – 2021-06-23 (×2): 1 g via INTRAVENOUS
  Filled 2021-06-22 (×2): qty 10

## 2021-06-22 MED ORDER — ADULT MULTIVITAMIN W/MINERALS CH
1.0000 | ORAL_TABLET | Freq: Every day | ORAL | Status: DC
  Administered 2021-06-22 – 2021-06-23 (×2): 1 via ORAL
  Filled 2021-06-22 (×2): qty 1

## 2021-06-22 MED ORDER — SODIUM CHLORIDE 0.9 % IV SOLN
100.0000 mg | Freq: Two times a day (BID) | INTRAVENOUS | Status: DC
  Administered 2021-06-22: 100 mg via INTRAVENOUS
  Filled 2021-06-22 (×2): qty 100

## 2021-06-22 MED ORDER — ONDANSETRON HCL 4 MG PO TABS: 4.0000 mg | ORAL_TABLET | Freq: Four times a day (QID) | ORAL | Status: DC | PRN

## 2021-06-22 MED ORDER — GADOBUTROL 1 MMOL/ML IV SOLN
7.5000 mL | Freq: Once | INTRAVENOUS | Status: AC | PRN
  Administered 2021-06-22: 7.5 mL via INTRAVENOUS

## 2021-06-22 MED ORDER — THIAMINE HCL 100 MG PO TABS
100.0000 mg | ORAL_TABLET | Freq: Every day | ORAL | Status: DC
  Administered 2021-06-22 – 2021-06-23 (×2): 100 mg via ORAL
  Filled 2021-06-22 (×2): qty 1

## 2021-06-22 MED ORDER — NYSTATIN 100000 UNIT/GM EX POWD
Freq: Three times a day (TID) | CUTANEOUS | Status: DC
  Filled 2021-06-22: qty 15

## 2021-06-22 NOTE — Assessment & Plan Note (Addendum)
Thiamine and folate. ?CIWA ?

## 2021-06-22 NOTE — ED Provider Notes (Signed)
?[facility]-EMERGENCY DEPT ?Provider Note ? ? ?CSN: [id number] ?Arrival date & time: 06/21/21  1820 ? ?  ? ?History ? ?Chief Complaint  ?Patient presents with  ? Foot Swelling  ? ? ?Jimmy [name] is a 66 y.o. male. ? ?HPI ? ?  ? ?66 year old male with history of alcohol abuse, bipolar disorder, depression, tobacco abuse, presents with concern for foot pain. ? ?Reports for the last 5 to 6 weeks he has been limping on his left foot with pain, however today the foot became a lot more red, warm and painful. The pain is severe, worse with ambulation.  He has been living on the streets with wet socks, walking a lot.  He denies any fever, nausea, vomiting, abdominal pain, or other concerns. ? ? ?Past Medical History:  ?Diagnosis Date  ? Alcohol abuse   ? Alcohol related seizure (HCC) ~ 2007  ? "I've had one"  ? Bipolar 1 disorder (HCC)   ? Bipolar disorder, unspecified (HCC) 08/19/2013  ? History reported  ? Depression   ? Gallstones dx'd 08/04/2013  ? GERD (gastroesophageal reflux disease)   ? Mental disorder   ? PUD (peptic ulcer disease)   ? Schizophrenia (HCC)   ? Tobacco use disorder 08/19/2013  ?  ? ?Home Medications ?Prior to Admission medications   ?Medication Sig Start Date End Date Taking? Authorizing Provider  ?omeprazole (PRILOSEC OTC) 20 MG tablet Take 20 mg by mouth daily as needed (for heartburn).   Yes [provider]  ?folic acid (FOLVITE) 1 MG tablet Take 1 tablet (1 mg total) by mouth daily. ?Patient not taking: Reported on 06/21/2021 09/24/20   Elease EtienneHongalgi, Anand D, MD  ?Multiple Vitamin (MULTIVITAMIN WITH MINERALS) TABS tablet Take 1 tablet by mouth daily. ?Patient not taking: Reported on 06/21/2021 09/24/20   Elease EtienneHongalgi, Anand D, MD  ?nicotine (NICODERM CQ - DOSED IN MG/24 HOURS) 21 mg/24hr patch Place 1 patch (21 mg total) onto the skin daily. ?Patient not taking: Reported on 06/21/2021 09/23/20   Elease EtienneHongalgi, Anand D, MD  ?ondansetron (ZOFRAN ODT) 4 MG disintegrating tablet Take 1 tablet  (4 mg total) by mouth every 8 (eight) hours as needed for nausea or vomiting. ?Patient not taking: Reported on 06/21/2021 09/28/20   Glynn Octaveancour, Stephen, MD  ?pantoprazole (PROTONIX) 40 MG tablet Take 1 tablet (40 mg total) by mouth 2 (two) times daily before a meal. ?Patient not taking: Reported on 06/21/2021 09/23/20   Elease EtienneHongalgi, Anand D, MD  ?thiamine 100 MG tablet Take 1 tablet (100 mg total) by mouth daily. ?Patient not taking: Reported on 06/21/2021 09/24/20   Elease EtienneHongalgi, Anand D, MD  ?famotidine (PEPCID) 20 MG tablet Take 1 tablet (20 mg total) by mouth 2 (two) times daily. 10/13/18 10/19/18  Arby BarrettePfeiffer, Marcy, MD  ?   ? ?Allergies    ?Neosporin [neomycin-bacitracin zn-polymyx]   ? ?Review of Systems   ?Review of Systems ? ?Physical Exam ?Updated Vital Signs ?BP 109/71 (BP Location: Left Arm)   Pulse 92   Temp 97.6 ?F (36.4 ?C) (Oral)   Resp 18   Ht 5\' 8"  (1.727 m)   Wt 65.8 kg   SpO2 98%   BMI 22.05 kg/m?  ?Physical Exam ?Vitals and nursing note reviewed.  ?Constitutional:   ?   General: He is not in acute distress. ?   Appearance: He is well-developed. He is not diaphoretic.  ?HENT:  ?   Head: Normocephalic and atraumatic.  ?Eyes:  ?   Conjunctiva/sclera: Conjunctivae normal.  ?  Cardiovascular:  ?   Rate and Rhythm: Normal rate and regular rhythm.  ?   Heart sounds: Normal heart sounds. No murmur heard. ?  No friction rub. No gallop.  ?Pulmonary:  ?   Effort: Pulmonary effort is normal. No respiratory distress.  ?   Breath sounds: Normal breath sounds. No wheezing or rales.  ?Abdominal:  ?   General: There is no distension.  ?   Palpations: Abdomen is soft.  ?   Tenderness: There is no abdominal tenderness. There is no guarding.  ?Musculoskeletal:     ?   General: Swelling and tenderness present.  ?   Cervical back: Normal range of motion.  ?Skin: ?   General: Skin is warm and dry.  ?   Findings: Erythema present.  ?Neurological:  ?   Mental Status: He is alert and oriented to person, place, and time.   ? ? ? ? ? ? ? ? ? ? ?ED Results / Procedures / Treatments   ?Labs ?(all labs ordered are listed, but only abnormal results are displayed) ?Labs Reviewed  ?MRSA NEXT GEN BY PCR, NASAL - Abnormal; Notable for the following components:  ?    Result Value  ? MRSA by PCR Next Gen DETECTED (*)   ? All other components within normal limits  ?CBC WITH DIFFERENTIAL/PLATELET - Abnormal; Notable for the following components:  ? RBC 3.98 (*)   ? HCT 37.8 (*)   ? Eosinophils Absolute 2.0 (*)   ? All other components within normal limits  ?COMPREHENSIVE METABOLIC PANEL - Abnormal; Notable for the following components:  ? Sodium 128 (*)   ? Chloride 96 (*)   ? CO2 21 (*)   ? BUN 6 (*)   ? Calcium 8.5 (*)   ? All other components within normal limits  ?LACTIC ACID, PLASMA - Abnormal; Notable for the following components:  ? Lactic Acid, Venous 2.3 (*)   ? All other components within normal limits  ?LACTIC ACID, PLASMA - Abnormal; Notable for the following components:  ? Lactic Acid, Venous 2.1 (*)   ? All other components within normal limits  ?CULTURE, BLOOD (ROUTINE X 2)  ?CULTURE, BLOOD (ROUTINE X 2)  ?ETHANOL  ? ? ?EKG ?None ? ?Radiology ?MR FOOT LEFT W WO CONTRAST ? ?Result Date: 06/22/2021 ?CLINICAL DATA:  Limping, acute foot pain EXAM: MRI OF THE LEFT FOREFOOT WITHOUT AND WITH CONTRAST TECHNIQUE: Multiplanar, multisequence MR imaging of the left forefoot was performed both before and after administration of intravenous contrast. 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 Subchondral edema of the distal first metatarsal and and proximal first phalanx. There is a T1 hypointense and T2 hypointense line extending from the articular surface of the proximal phalanx (series  7, image 13) concerning for a stress fracture in the settings of advanced osteoarthritis. There also moderate degenerative arthritic changes of the first interphalangeal joint and interphalangeal joints of the second, third and fourth digits. No joint effusion. There is no abnormal enhancement on the post contrast sequences. There is no fluid collection or abscess. Ligaments Collateral ligaments are intact.  Lisfranc ligament is intact. Muscles and Tendons Flexor, peroneal and extensor compartment tendons are intact. Muscles are normal. Soft tissue No fluid collection or hematoma. No soft tissue mass. Subcutaneous soft tissue edema about  the dorsum of the foot and medial aspect of the forefoot. IMPRESSION: 1. 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 2. Possibly a stress fracture in the proximal phalanx of the first digit in the settings of advanced osteoarthritis of the first metatarsophalangeal joint. 3. Subcutaneous soft tissue edema without evidence of fluid collection or abscess. Electronically Signed   By: Larose Hires D.O.   On: 06/22/2021 09:44  ? ?DG Foot Complete Left ? ?Result Date: 06/21/2021 ?CLINICAL DATA:  Infection.  Foot pain and swelling for 10 days. EXAM: LEFT FOOT - COMPLETE 3+ VIEW COMPARISON:  None. FINDINGS: Severe great toe metatarsophalangeal joint space narrowing with bone-on-bone contact and large peripheral degenerative osteophytes. There is subchondral sclerosis and mild subchondral cortical erosion. Moderate surrounding soft tissue swelling. Mild degenerative spurring between the bases of the first and second metatarsals. Tiny plantar calcaneal heel spur. No acute fracture or dislocation. IMPRESSION: Severe joint space narrowing, peripheral osteophytosis, subchondral sclerosis, and apparent mild erosion about the great toe metatarsophalangeal joint. This is compatible with  osteoarthritis. Inflammatory arthritis can have a similar appearance. Cannot exclude septic arthritis, especially given the indication for this study. Electronically Signed   By: Neita Garnet M.D.   On: 06/21/2021 19:31   ? ?Procedures

## 2021-06-22 NOTE — Progress Notes (Signed)
PHARMACIST - PHYSICIAN COMMUNICATION ? ?CONCERNING: Antibiotic IV to Oral Route Change Policy ? ?RECOMMENDATION: ?This patient is receiving doxycycline & flagyl  by the intravenous route.  Based on criteria approved by the Pharmacy and Therapeutics Committee, the antibiotic(s) is/are being converted to the equivalent oral dose form(s). ? ? ?DESCRIPTION: ?These criteria include: ?Patient being treated for a respiratory tract infection, urinary tract infection, cellulitis or clostridium difficile associated diarrhea if on metronidazole ?The patient is not neutropenic and does not exhibit a GI malabsorption state ?The patient is eating (either orally or via tube) and/or has been taking other orally administered medications for a least 24 hours ?The patient is improving clinically and has a Tmax < 100.5 ? ?If you have questions about this conversion, please contact the Pharmacy Department  ?[]   [phone] )  ( [phone] ?[]   [phone] )  [facility] ?[]   [phone] )  [facility] ?[]   [phone] )  [facility] ?[x]   [phone] )  [facility]   ?

## 2021-06-22 NOTE — Assessment & Plan Note (Signed)
-  Nicotine patch 

## 2021-06-22 NOTE — Assessment & Plan Note (Addendum)
Looks like athletes foot with superimposed cellulitis of L foot. ?1. Cellulitis pathway ?2. Empiric rocephin ?3. Nystatin powder on left + right foot. ?4. Check MRSA PCR nares ?5. MRI foot results pending. ?
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
## 2021-06-22 NOTE — Progress Notes (Signed)
Orthopedic Tech Progress Note ?Patient Details:  ?ADONUS USELMAN ?07-13-1955 ?[id number] ? ?Ortho Devices ?Type of Ortho Device: CAM walker ?Ortho Device/Splint Location: left ?Ortho Device/Splint Interventions: Application ?  ?Post Interventions ?Patient Tolerated: Well ?Instructions Provided: Care of device ? ?Saul Fordyce ?06/22/2021, 11:35 AM ? ?

## 2021-06-22 NOTE — Progress Notes (Signed)
Non-billable (Day 0) Progress Note: ? ?66 year old male with a past medical history of alcohol abuse, schizophrenia, bipolar disorder, homelessness.  Admitted for left foot cellulitis and superimposed athlete's foot.  Currently on Rocephin.  MRSA nares positive.  We will add IV doxycycline.  Discontinue nystatin and start topical Lamisil.  MRI shows no osteomyelitis.  Shows severe osteoarthritis.  Also notes proximal first phalanx stress fracture.  Discussed with Ortho and recommended placing a boot and following up with them in 2 weeks outpatient.  TOC consulted due to patient being homeless. ?

## 2021-06-22 NOTE — Assessment & Plan Note (Signed)
In setting of EtOH abuse. ?Mild, chronic, stable, and unchanged for at least 2+ years now. ?

## 2021-06-22 NOTE — Assessment & Plan Note (Signed)
SW consult  

## 2021-06-22 NOTE — Discharge Instructions (Addendum)
Facility Address Phone Ext. Comments  ?[facility] [employer].  ?[place] [phone]  DV Shelter   ?[facility] [address] [place], [place] [address] [phone]  Men/Woman  ?[facility] [address]  ?[place] [phone]  DV Shelter  ?[facility] [address]  ?[place] [phone]  Families' w/Children ?  ?[facility] [address]  ?[place] [phone]  Men/Women/Families   ?[facility] [address] [place].  ?[place] [phone] Men and Women  ?[facility] [employer] [phone]  ?  pregnant/parenting girls and women  ?[facility]  [place] [phone]  ages 16-21  ?[facility] [place] [phone]  Youth ages 11-17  ?[facility] [address] ?[place] [phone]  Men  ?[facility] [address]  ?[place] [phone]  Women  ?[facility] [address]  ?[place] [phone]  Single Women and Women w/Children  ?[facility] [address]  ?[place] [phone] 108 Men/Women/Families   ?[facility] [address]  ?[place] [phone]  DV Shelter  ?[facility] [address]  [place] [phone] Men and Women  ?[facility] [address] [place] [phone] Men  ?[facility] [address]  ?[place] [phone] Men  ?[facility] [address]  ?[place] [phone]  Single Women and Families  ?[facility] at [facility]. [place] [phone] or  ?[phone]  Pregnant Women  ?[facility] [address]  ?[place]  [phone]  Men/Women and Families  ?      ? ? ?1) Take doxycycline and keflex as prescribed ?2) Follow up with PCP within 48 hours of discharge ?3) Follow up with Orthopedics in 2 weeks ?4) Use cam boot as instructed ? ?

## 2021-06-23 MED ORDER — TERBINAFINE HCL 1 % EX CREA: TOPICAL_CREAM | Freq: Two times a day (BID) | CUTANEOUS | 0 refills | Status: DC

## 2021-06-23 MED ORDER — DOXYCYCLINE HYCLATE 100 MG PO TABS: 100.0000 mg | ORAL_TABLET | Freq: Two times a day (BID) | ORAL | 0 refills | Status: AC

## 2021-06-23 MED ORDER — CEPHALEXIN 500 MG PO CAPS: 500.0000 mg | ORAL_CAPSULE | Freq: Four times a day (QID) | ORAL | 0 refills | Status: AC

## 2021-06-23 NOTE — TOC Transition Note (Addendum)
Transition of Care (TOC) - CM/SW Discharge Note ? ? ?Patient Details  ?Name: Jimmy [name] ?MRN: [id number] ?Date of Birth: September 15, 1955 ? ?Transition of Care (TOC) CM/SW Contact:  ?Cecille Po, RN ?Phone Number: ?06/23/2021, 11:27 AM ? ? ?Clinical Narrative:    ? ?Patient states he's living with his brother and sister-in-law.  Gave him a Programmer, systems to assist with discharge meds.  Patient states he will go to the [place] [facility].  Gave him a taxi voucher and a bus pass.   ? ? ?1622 Addendum: Was informed by nursing that patient left his discharge paperwork and Match letter in the room, did not take it with him.   ? ?Final next level of care: Home/Self Care ?Barriers to Discharge: Barriers Resolved ? ? ?

## 2021-06-23 NOTE — Discharge Summary (Addendum)
?Physician Discharge Summary ?  ?Patient: Jimmy [name] MRN: [id number] DOB: [birth year]  ?Admit date:     06/21/2021  ?Discharge date: 06/23/21  ?Discharge Physician: Leslee Home  ? ?PCP: Patient, No Pcp Per (Inactive)  ? ?Recommendations at discharge:  ?1) Take doxycycline and keflex as prescribed ?2) Follow up with PCP within 48 hours of discharge ?3) Follow up with Orthopedics in 2 weeks ?4) Use cam boot as instructed ? ?Discharge Diagnoses: ?Cellulitis of the left foot with superimposed athlete's foot ?Severe alcohol use disorder ?Chronic hyponatremia due to chronic alcohol dependence ?Nicotine dependence ? ?Hospital Course: ?66 year old Caucasian male with a past medical history of alcohol dependence, schizophrenia, bipolar disorder, tobacco dependence.  This patient presented to the emergency department with swelling and erythema of the left foot somewhat chronic but was worsening the day prior to arrival.  Was having difficulty with ambulation due to pain.  He was admitted for purulent left foot cellulitis and superimposed athlete's foot.  Was started on ceftriaxone and MRSA nares was positive therefore doxycycline was added.  Athlete's foot was treated with Lamisil topically.  MRI showed no osteomyelitis but showed severe osteoarthritis and a proximal first phalanx stress fracture.  Discussed with Ortho and they recommended placing a walking boot and following up with them in 2 weeks outpatient.  There is concern for the patient being homeless and TOC/social work was consulted and they have provided him resources to obtain his medications and the patient states he lives with his brother and sister-in-law.  He will need to follow-up with orthopedics in 2 weeks on discharge.  Cellulitis and athlete's foot clinically looks better.  No signs of sepsis.  Erythema and swelling improving.  He will be discharged on p.o. doxycycline and p.o. Keflex.  Will be discharged on Lamisil topically for athlete's foot to be  continued at home.  Strict return precautions given.  Follow-up with PCP within 48 hours of discharge.  Of note was placed on CIWA protocol during this hospitalization given his alcohol dependence but did not require any Ativan per CIWA protocol.  No signs of withdrawal during hospitalization.  Counseled on smoking cessation. Trench foot mentioned in initial documentation: clinically undetermined ? ?Assessment and Plan: ?Cellulitis of left foot ?Severe alcohol use disorder ?Hyponatremia ?Nicotine dependence, cigarettes, uncomplicated ?Homeless ? ?  ? ?Consultants: Orthopedics ?Procedures performed: None ?Disposition: Home ?Diet recommendation:  ?Discharge Diet Orders (From admission, onward)  ? ?  Start     Ordered  ? 06/23/21 0000  Diet - low sodium heart healthy       ? 06/23/21 1031  ? ?  ?  ? ?  ? ?Cardiac diet ?DISCHARGE MEDICATION: ?Allergies as of 06/23/2021   ? ?   Reactions  ? Neosporin [neomycin-bacitracin Zn-polymyx] Rash  ? ?  ? ?  ?Medication List  ?  ? ?STOP taking these medications   ? ?nicotine 21 mg/24hr patch ?Commonly known as: NICODERM CQ - dosed in mg/24 hours ?  ?ondansetron 4 MG disintegrating tablet ?Commonly known as: Zofran ODT ?  ?pantoprazole 40 MG tablet ?Commonly known as: PROTONIX ?  ? ?  ? ?TAKE these medications   ? ?cephALEXin 500 MG capsule ?Commonly known as: KEFLEX ?Take 1 capsule (500 mg total) by mouth 4 (four) times daily for 10 days. ?  ?doxycycline 100 MG tablet ?Commonly known as: VIBRA-TABS ?Take 1 tablet (100 mg total) by mouth 2 (two) times daily for 10 days. ?  ?folic acid 1 MG tablet ?Commonly  known as: FOLVITE ?Take 1 tablet (1 mg total) by mouth daily. ?  ?multivitamin with minerals Tabs tablet ?Take 1 tablet by mouth daily. ?  ?omeprazole 20 MG tablet ?Commonly known as: PRILOSEC OTC ?Take 20 mg by mouth daily as needed (for heartburn). ?  ?terbinafine 1 % cream ?Commonly known as: LAMISIL ?Apply topically 2 (two) times daily. ?  ?thiamine 100 MG tablet ?Take 1 tablet  (100 mg total) by mouth daily. ?  ? ?  ? ? Follow-up Information   ? ? Tania Ade, MD. Call in 2 week(s).   ?Specialty: Orthopedic Surgery ?Contact information: ?[address] ?[address] ?[place] [address] ?[phone] ? ? ?  ?  ? ? [place]. Call in 2 day(s).   ?Contact information: ?[address] ?[place] ?[phone] ? ?  ?  ? ?  ?  ? ?  ? ?Discharge Exam: ?Danley Danker Weights  ? 06/21/21 1842  ?Weight: 65.8 kg  ? ?Constitutional: Disheveled and unkempt ?Eyes: PERRL, lids and conjunctivae normal ?ENMT: Mucous membranes are moist. Posterior pharynx clear of any exudate or lesions.Normal dentition.  ?Neck: normal, supple, no masses, no thyromegaly ?Respiratory: clear to auscultation bilaterally, no wheezing, no crackles. Normal respiratory effort. No accessory muscle use.  ?Cardiovascular: Regular rate and rhythm, no murmurs / rubs / gallops. No extremity edema. 2+ pedal pulses. No carotid bruits.  ?Abdomen: no tenderness, no masses palpated. No hepatosplenomegaly. Bowel sounds positive.  ?Musculoskeletal: no clubbing / cyanosis. No joint deformity upper and lower extremities. Good ROM, no contractures. Normal muscle tone.  ?Skin: improved left foot cellulitis with decrease in swelling and erythema ?Neurologic: CN 2-12 grossly intact. Sensation intact, DTR normal. Strength 5/5 in all 4.  ?Psychiatric: Normal judgment and insight. Alert and oriented x 3. Normal mood.  ? ?Condition at discharge: improving ? ?The results of significant diagnostics from this hospitalization (including imaging, microbiology, ancillary and laboratory) are listed below for reference.  ? ?Imaging Studies: ?MR FOOT LEFT W WO CONTRAST ? ?Result Date: 06/22/2021 ?CLINICAL DATA:  Limping, acute foot pain EXAM: MRI OF THE LEFT FOREFOOT WITHOUT AND WITH CONTRAST TECHNIQUE: Multiplanar, multisequence MR imaging of the left forefoot was performed both before and after administration  of intravenous contrast. CONTRAST:  7.48mL GADAVIST GADOBUTROL 1 MMOL/ML IV SOLN COMPARISON:  Radiographs dated June 21, 2021 FINDINGS: Bones/Joint/Cartilage There is advanced osteoarthritis of the first metatarsophalangeal joint with complete loss of the articular cartilage, subchondral cystic changes and prominent osteophytes. Subchondral edema of the distal first metatarsal and and proximal first phalanx. There is a T1 hypointense and T2 hypointense line extending from the articular surface of the proximal phalanx (series 7, image 13) concerning for a stress fracture in the settings of advanced osteoarthritis. There also moderate degenerative arthritic changes of the first interphalangeal joint and interphalangeal joints of the second, third and fourth digits. No joint effusion. There is no abnormal enhancement on the post contrast sequences. There is no fluid collection or abscess. Ligaments Collateral ligaments are intact.  Lisfranc ligament is intact. Muscles and Tendons Flexor, peroneal and extensor compartment tendons are intact. Muscles are normal. Soft tissue No fluid collection or hematoma. No soft tissue mass. Subcutaneous soft tissue edema about the dorsum of the foot and medial aspect of the forefoot. IMPRESSION: 1. 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 2. Possibly a stress fracture in the proximal phalanx of the first digit in  the settings of advanced osteoarthritis of the first metatarsophalangeal joint. 3. Subcutaneous soft tissue edema without evidence of fluid collection or abscess. Electronically Signed   By: Keane Police D.O.   On: 06/22/2021 09:44  ? ?DG Foot Complete Left ? ?Result Date: 06/21/2021 ?CLINICAL DATA:  Infection.  Foot pain and swelling for 10 days. EXAM: LEFT FOOT - COMPLETE 3+ VIEW COMPARISON:  None. FINDINGS: Severe great toe metatarsophalangeal joint space narrowing with  bone-on-bone contact and large peripheral degenerative osteophytes. There is subchondral sclerosis and mild subchondral cortical erosion. Moderate surrounding soft tissue swelling. Mild degenerative

## 2021-06-23 NOTE — Progress Notes (Signed)
Discharge instructions read to patient. He states he could read a little. Prescriptions to be picked up at [facility]. [name] states his sister-in-law will help him with his medications. Next medications due are written on his medication sheet.Jimmy [name] was given a taxie voucher, 1 bus pass and and a match letter to help pay for his prescriptions.The [employer] took patient down in a wheelchair for discharge, he was taking a taxi to [employer] on [address]. When the tech came back upstairs, she noticed the patient didn't take his envelope with discharge instructions, then house keeping threw it away. Case manager informed. ?

## 2021-06-27 LAB — CULTURE, BLOOD (ROUTINE X 2)
Culture: NO GROWTH
Culture: NO GROWTH
Special Requests: ADEQUATE
Special Requests: ADEQUATE

## 2021-07-10 ENCOUNTER — Emergency Department (HOSPITAL_COMMUNITY)
Admission: EM | Admit: 2021-07-10 | Discharge: 2021-07-10 | Disposition: A | Discharge status: Home / Self Care | Payer: Medicaid Other | Attending: Emergency Medicine | Admitting: Emergency Medicine

## 2021-07-10 ENCOUNTER — Other Ambulatory Visit: Payer: Self-pay

## 2021-07-10 ENCOUNTER — Encounter (HOSPITAL_COMMUNITY): Payer: Self-pay

## 2021-07-10 DIAGNOSIS — B353 Tinea pedis: Secondary | ICD-10-CM | POA: Insufficient documentation

## 2021-07-10 DIAGNOSIS — L03116 Cellulitis of left lower limb: Secondary | ICD-10-CM | POA: Insufficient documentation

## 2021-07-10 DIAGNOSIS — L03119 Cellulitis of unspecified part of limb: Secondary | ICD-10-CM

## 2021-07-10 LAB — CBC WITH DIFFERENTIAL/PLATELET
Abs Immature Granulocytes: 0.04 10*3/uL (ref 0.00–0.07)
Basophils Absolute: 0.1 10*3/uL (ref 0.0–0.1)
Basophils Relative: 1 %
Eosinophils Absolute: 1.4 10*3/uL — ABNORMAL HIGH (ref 0.0–0.5)
Eosinophils Relative: 20 %
HCT: 39.5 % (ref 39.0–52.0)
Hemoglobin: 13.2 g/dL (ref 13.0–17.0)
Immature Granulocytes: 1 %
Lymphocytes Relative: 25 %
Lymphs Abs: 1.7 10*3/uL (ref 0.7–4.0)
MCH: 32.7 pg (ref 26.0–34.0)
MCHC: 33.4 g/dL (ref 30.0–36.0)
MCV: 97.8 fL (ref 80.0–100.0)
Monocytes Absolute: 0.7 10*3/uL (ref 0.1–1.0)
Monocytes Relative: 11 %
Neutro Abs: 3 10*3/uL (ref 1.7–7.7)
Neutrophils Relative %: 42 %
Platelets: 380 10*3/uL (ref 150–400)
RBC: 4.04 MIL/uL — ABNORMAL LOW (ref 4.22–5.81)
RDW: 12.3 % (ref 11.5–15.5)
WBC: 7 10*3/uL (ref 4.0–10.5)
nRBC: 0 % (ref 0.0–0.2)

## 2021-07-10 LAB — BASIC METABOLIC PANEL
Anion gap: 6 (ref 5–15)
BUN: 9 mg/dL (ref 8–23)
CO2: 23 mmol/L (ref 22–32)
Calcium: 8.9 mg/dL (ref 8.9–10.3)
Chloride: 100 mmol/L (ref 98–111)
Creatinine, Ser: 0.76 mg/dL (ref 0.61–1.24)
GFR, Estimated: >60 mL/min (ref >60)
Glucose, Bld: 94 mg/dL (ref 70–99)
Potassium: 4.1 mmol/L (ref 3.5–5.1)
Sodium: 129 mmol/L — ABNORMAL LOW (ref 135–145)

## 2021-07-10 MED ORDER — CLOTRIMAZOLE 1 % EX CREA
TOPICAL_CREAM | Freq: Two times a day (BID) | CUTANEOUS | Status: DC
  Filled 2021-07-10: qty 15

## 2021-07-10 MED ORDER — DOXYCYCLINE HYCLATE 100 MG PO TABS
100.0000 mg | ORAL_TABLET | Freq: Once | ORAL | Status: AC
  Administered 2021-07-10: 100 mg via ORAL
  Filled 2021-07-10: qty 1

## 2021-07-10 MED ORDER — DOXYCYCLINE HYCLATE 100 MG PO CAPS: 100.0000 mg | ORAL_CAPSULE | Freq: Two times a day (BID) | ORAL | 0 refills | Status: AC

## 2021-07-10 NOTE — Progress Notes (Signed)
Transition of Care [facility]) - Emergency Department Mini Assessment ? ? ?Patient Details  ?Name: Jimmy [name] ?MRN: [id number] ?Date of Birth: Sep 27, 1955 ? ?Transition of Care (TOC) CM/SW Contact:    ?Roseanne Kaufman, RN ?Phone Number: ?07/10/2021, 7:04 PM ? ? ?Clinical Narrative: ? ?Patient presents with c/o bilateral swelling in feet. Patient was given a [place] letter on 06/23/21 however he forgot at hospital.  ? ?ED Mini Assessment: ?  ? ? RNCM received TOC consult for medication assistance. RNCM reinstated [place] letter and gave to patient to assist with discharge medications. Patient states he will go to the [place] [facility]. ? ?TOC will sign off ? ?  ?Patient Contact and Communications ?  ? ? ?Admission diagnosis:  Chronic Foot Infection  ?Patient Active Problem List  ? Diagnosis Date Noted  ? Cellulitis of left foot 06/22/2021  ? Pressure injury of skin 06/22/2021  ? Acute upper GI bleed 09/20/2020  ? Nicotine dependence, cigarettes, uncomplicated [id number]  ? Acute blood loss anemia 09/20/2020  ? Hyponatremia 09/20/2020  ? Acute gastric ulcer with hemorrhage   ? Alcohol abuse with alcohol-induced mood disorder ([place]) 01/09/2016  ? Homicidal ideation   ? Severe alcohol use disorder ([place]) 06/24/2015  ? Substance induced mood disorder ([place]) 06/24/2015  ? 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 Fracture of bone 06/11/2015  ? Polysubstance abuse ([place]) 09/06/2014  ? GIB (gastrointestinal bleeding) 09/06/2014  ? Homeless 09/06/2014  ? GERD (gastroesophageal reflux disease)   ? PUD (peptic ulcer disease)   ? Gastroesophageal reflux disease without esophagitis   ? Hypokalemia   ? Alcohol abuse 12/05/2013  ? S/P alcohol detoxification 12/01/2013  ? Seizure ([place]) 08/24/2013  ? Tobacco use disorder 08/19/2013  ? ?PCP:  Patient, No Pcp Per (Inactive) ?Pharmacy:   ?[place], [place] - [address] ?[place] ?[place] [address] ?Phone: [phone] Fax: [phone] ?  ?

## 2021-07-10 NOTE — Discharge Instructions (Signed)
Please go to the [facility] store to pick up your antibiotics tomorrow. ? ?Please wear DRY socks and keep your feet DRY.  Put the lotion on your feet two times daily for 10 days. ?

## 2021-07-10 NOTE — ED Provider Notes (Signed)
?[facility]-EMERGENCY DEPT ?Provider Note ? ? ?CSN: [id number] ?Arrival date & time: 07/10/21  1721 ? ?  ? ?History ? ?Chief Complaint  ?Patient presents with  ? Recurrent Skin Infections  ? ? ?Jimmy [name] is a 66 y.o. male returns emerged department with pain and swelling of both feet.  The patient is homeless.  He was admitted to the hospital approximately 2 weeks ago for similar condition, diagnosed with cellulitis, and an MRI scan of the feet at that time which did not shows evidence of osteomyelitis, and was discharged with prescriptions for Keflex and doxycycline, which she was not able to fill.  He was also diagnosed with athlete's foot and prescribed Lomotil cream. ? ?HPI ? ?  ? ?Home Medications ?Prior to Admission medications   ?Medication Sig Start Date End Date Taking? Authorizing Provider  ?doxycycline (VIBRAMYCIN) 100 MG capsule Take 1 capsule (100 mg total) by mouth 2 (two) times daily for 10 days. 07/10/21 07/20/21 Yes Jamont Mellin, Kermit Balo, MD  ?folic acid (FOLVITE) 1 MG tablet Take 1 tablet (1 mg total) by mouth daily. ?Patient not taking: Reported on 06/21/2021 09/24/20   Elease Etienne, MD  ?Multiple Vitamin (MULTIVITAMIN WITH MINERALS) TABS tablet Take 1 tablet by mouth daily. ?Patient not taking: Reported on 06/21/2021 09/24/20   Elease Etienne, MD  ?omeprazole (PRILOSEC OTC) 20 MG tablet Take 20 mg by mouth daily as needed (for heartburn).    [provider]  ?terbinafine (LAMISIL) 1 % cream Apply topically 2 (two) times daily. 06/23/21   Verdia Kuba, DO  ?thiamine 100 MG tablet Take 1 tablet (100 mg total) by mouth daily. ?Patient not taking: Reported on 06/21/2021 09/24/20   Elease Etienne, MD  ?famotidine (PEPCID) 20 MG tablet Take 1 tablet (20 mg total) by mouth 2 (two) times daily. 10/13/18 10/19/18  Arby Barrette, MD  ?   ? ?Allergies    ?Neosporin [neomycin-bacitracin zn-polymyx]   ? ?Review of Systems   ?Review of Systems ? ?Physical Exam ?Updated Vital  Signs ?BP (!) 162/94   Pulse 75   Temp 98.2 ?F (36.8 ?C) (Oral)   Resp 18   Ht 5\' 8"  (1.727 m)   Wt 65.8 kg   SpO2 100%   BMI 22.05 kg/m?  ?Physical Exam ?Constitutional:   ?   General: He is not in acute distress. ?HENT:  ?   Head: Normocephalic and atraumatic.  ?Eyes:  ?   Conjunctiva/sclera: Conjunctivae normal.  ?   Pupils: Pupils are equal, round, and reactive to light.  ?Cardiovascular:  ?   Rate and Rhythm: Normal rate and regular rhythm.  ?Pulmonary:  ?   Effort: Pulmonary effort is normal. No respiratory distress.  ?Musculoskeletal:  ?   Comments: Edema and erythema of the bilateral feet without crepitus  ?Skin: ?   General: Skin is warm and dry.  ?Neurological:  ?   General: No focal deficit present.  ?   Mental Status: He is alert and oriented to person, place, and time. Mental status is at baseline.  ? ? ?ED Results / Procedures / Treatments   ?Labs ?(all labs ordered are listed, but only abnormal results are displayed) ?Labs Reviewed  ?CBC WITH DIFFERENTIAL/PLATELET - Abnormal; Notable for the following components:  ?    Result Value  ? RBC 4.04 (*)   ? Eosinophils Absolute 1.4 (*)   ? All other components within normal limits  ?BASIC METABOLIC PANEL - Abnormal; Notable for the  following components:  ? Sodium 129 (*)   ? All other components within normal limits  ? ? ?EKG ?None ? ?Radiology ?No results found. ? ?Procedures ?Procedures  ? ? ?Medications Ordered in ED ?Medications  ?clotrimazole (LOTRIMIN) 1 % cream ( Topical Given 07/10/21 2007)  ?doxycycline (VIBRA-TABS) tablet 100 mg (100 mg Oral Given 07/10/21 1824)  ? ? ?ED Course/ Medical Decision Making/ A&P ?  ?                        ?Medical Decision Making ?Amount and/or Complexity of Data Reviewed ?Labs: ordered. ? ?Risk ?Prescription drug management. ? ? ?Patient is here with ongoing foot pain,ddx includes cellulitis vs edema vs neuropathy vs other ? ?Low suspicion for necrotizing fasciitis.  Low suspicion for osteomyelitis.  I reviewed  his external records including hospital work-up and MRI scan from earlier this month during hospitalization. ? ?Labs today show no leukocytosis, levels at baseline ? ?I doubt sepsis.  I spoke to the pharmacist and we are able to provide the patient doxycycline for 10 days supply and lomotil to leave the ED with.   Patient given clean socks (with additional pairs) and advised about the importance of keeping feet dry, not moist. ? ? ? ? ? ? ? ?Final Clinical Impression(s) / ED Diagnoses ?Final diagnoses:  ?Cellulitis of lower extremity, unspecified laterality  ? ? ?Rx / DC Orders ?ED Discharge Orders   ? ?      Ordered  ?  doxycycline (VIBRAMYCIN) 100 MG capsule  2 times daily       ? 07/10/21 1958  ? ?  ?  ? ?  ? ? ?  ?Terald Sleeper, MD ?07/10/21 2332 ? ?

## 2021-07-10 NOTE — Progress Notes (Signed)
?  [facility] Medication Assistance Card ?Name: Jimmy [name]. Christmas ?ID (MRN): [id number] ?Bin: [id number] RX Group: [id number] ?Discharge Date: 07/10/21 ?Expiration Date:07/22/21  ?                                         (must be filled within 7 days of discharge) ? ? ? ? ?You have been approved to have the prescriptions written by your discharging physician filled through our [facility] (Medication Assistance Through [facility]) program. This program allows for a one-time (no refills) 34-day supply of selected medications for a low copay amount. ? ?The copay is $3.00 per prescription. For instance, if you have one prescription, you will pay $3.00; for two prescriptions, you pay $6.00; for three prescriptions, you pay $9.00; and so on. ? ?Only certain pharmacies are participating in this program with [facility]. You will need to select one of the pharmacies from the attached list and take your prescriptions, this letter, and your photo ID to one of the [facility] pharmacies, [facility], [facility] at [address], or [facility] [address].  ? ?We are excited that you are able to use the [facility] program to get your medications. These prescriptions must be filled within 7 days of hospital discharge or they will no longer be valid for the [facility] program. Should you have any problems with your prescriptions please contact your case management team member at [phone] for [place]/ [facility]. ? ?Thank you, ?[place]  ?

## 2021-07-10 NOTE — ED Triage Notes (Addendum)
Per EMS, Pt c/o increasing infection in bilateral feet.  Pain score 10/10.  Pt was seen previously for same but never got antibiotic filled.  Pt c/o swelling and drainage from both feet.  ? ?Pt was discharged from hospital 1.5 weeks ago for same.  ?

## 2022-01-10 ENCOUNTER — Emergency Department (HOSPITAL_COMMUNITY): Payer: Medicare Other

## 2022-01-10 ENCOUNTER — Encounter (HOSPITAL_COMMUNITY): Payer: Self-pay

## 2022-01-10 ENCOUNTER — Other Ambulatory Visit: Payer: Self-pay

## 2022-01-10 ENCOUNTER — Inpatient Hospital Stay (HOSPITAL_COMMUNITY)
Admission: EM | Admit: 2022-01-10 | Discharge: 2022-01-14 | DRG: 603 | Disposition: A | Discharge status: Home / Self Care | Payer: Medicare Other | Attending: Internal Medicine | Admitting: Internal Medicine

## 2022-01-10 DIAGNOSIS — E871 Hypo-osmolality and hyponatremia: Secondary | ICD-10-CM | POA: Diagnosis present

## 2022-01-10 DIAGNOSIS — Z811 Family history of alcohol abuse and dependence: Secondary | ICD-10-CM

## 2022-01-10 DIAGNOSIS — K219 Gastro-esophageal reflux disease without esophagitis: Secondary | ICD-10-CM | POA: Diagnosis present

## 2022-01-10 DIAGNOSIS — L03119 Cellulitis of unspecified part of limb: Principal | ICD-10-CM

## 2022-01-10 DIAGNOSIS — F17200 Nicotine dependence, unspecified, uncomplicated: Secondary | ICD-10-CM | POA: Diagnosis not present

## 2022-01-10 DIAGNOSIS — Z1152 Encounter for screening for COVID-19: Secondary | ICD-10-CM

## 2022-01-10 DIAGNOSIS — Z6825 Body mass index (BMI) 25.0-25.9, adult: Secondary | ICD-10-CM

## 2022-01-10 DIAGNOSIS — L89516 Pressure-induced deep tissue damage of right ankle: Secondary | ICD-10-CM | POA: Diagnosis present

## 2022-01-10 DIAGNOSIS — F17210 Nicotine dependence, cigarettes, uncomplicated: Secondary | ICD-10-CM | POA: Diagnosis present

## 2022-01-10 DIAGNOSIS — T69022A Immersion foot, left foot, initial encounter: Secondary | ICD-10-CM | POA: Diagnosis present

## 2022-01-10 DIAGNOSIS — Z79899 Other long term (current) drug therapy: Secondary | ICD-10-CM

## 2022-01-10 DIAGNOSIS — F319 Bipolar disorder, unspecified: Secondary | ICD-10-CM | POA: Diagnosis present

## 2022-01-10 DIAGNOSIS — F1010 Alcohol abuse, uncomplicated: Secondary | ICD-10-CM | POA: Diagnosis present

## 2022-01-10 DIAGNOSIS — X58XXXA Exposure to other specified factors, initial encounter: Secondary | ICD-10-CM | POA: Diagnosis present

## 2022-01-10 DIAGNOSIS — R059 Cough, unspecified: Secondary | ICD-10-CM | POA: Diagnosis present

## 2022-01-10 DIAGNOSIS — Z8711 Personal history of peptic ulcer disease: Secondary | ICD-10-CM

## 2022-01-10 DIAGNOSIS — L89212 Pressure ulcer of right hip, stage 2: Secondary | ICD-10-CM | POA: Diagnosis present

## 2022-01-10 DIAGNOSIS — L03115 Cellulitis of right lower limb: Secondary | ICD-10-CM | POA: Diagnosis present

## 2022-01-10 DIAGNOSIS — L03116 Cellulitis of left lower limb: Secondary | ICD-10-CM | POA: Diagnosis present

## 2022-01-10 DIAGNOSIS — E663 Overweight: Secondary | ICD-10-CM | POA: Diagnosis present

## 2022-01-10 DIAGNOSIS — B86 Scabies: Secondary | ICD-10-CM | POA: Diagnosis present

## 2022-01-10 DIAGNOSIS — Z888 Allergy status to other drugs, medicaments and biological substances status: Secondary | ICD-10-CM | POA: Diagnosis not present

## 2022-01-10 DIAGNOSIS — Z5900 Homelessness unspecified: Secondary | ICD-10-CM | POA: Diagnosis not present

## 2022-01-10 DIAGNOSIS — E8809 Other disorders of plasma-protein metabolism, not elsewhere classified: Secondary | ICD-10-CM | POA: Diagnosis present

## 2022-01-10 DIAGNOSIS — D649 Anemia, unspecified: Secondary | ICD-10-CM | POA: Diagnosis present

## 2022-01-10 DIAGNOSIS — Z720 Tobacco use: Secondary | ICD-10-CM | POA: Diagnosis present

## 2022-01-10 DIAGNOSIS — K2100 Gastro-esophageal reflux disease with esophagitis, without bleeding: Secondary | ICD-10-CM | POA: Diagnosis not present

## 2022-01-10 DIAGNOSIS — T69021A Immersion foot, right foot, initial encounter: Secondary | ICD-10-CM | POA: Diagnosis present

## 2022-01-10 DIAGNOSIS — F1099 Alcohol use, unspecified with unspecified alcohol-induced disorder: Secondary | ICD-10-CM | POA: Diagnosis present

## 2022-01-10 LAB — COMPREHENSIVE METABOLIC PANEL
ALT: 17 U/L (ref 0–44)
AST: 26 U/L (ref 15–41)
Albumin: 4 g/dL (ref 3.5–5.0)
Alkaline Phosphatase: 70 U/L (ref 38–126)
Anion gap: 8 (ref 5–15)
BUN: 8 mg/dL (ref 8–23)
CO2: 24 mmol/L (ref 22–32)
Calcium: 8.7 mg/dL — ABNORMAL LOW (ref 8.9–10.3)
Chloride: 93 mmol/L — ABNORMAL LOW (ref 98–111)
Creatinine, Ser: 0.82 mg/dL (ref 0.61–1.24)
GFR, Estimated: >60 mL/min (ref >60)
Glucose, Bld: 106 mg/dL — ABNORMAL HIGH (ref 70–99)
Potassium: 4.9 mmol/L (ref 3.5–5.1)
Sodium: 125 mmol/L — ABNORMAL LOW (ref 135–145)
Total Bilirubin: 1.2 mg/dL (ref 0.3–1.2)
Total Protein: 8.5 g/dL — ABNORMAL HIGH (ref 6.5–8.1)

## 2022-01-10 LAB — CBC WITH DIFFERENTIAL/PLATELET
Abs Immature Granulocytes: 0.07 10*3/uL (ref 0.00–0.07)
Basophils Absolute: 0.1 10*3/uL (ref 0.0–0.1)
Basophils Relative: 0 %
Eosinophils Absolute: 1.9 10*3/uL — ABNORMAL HIGH (ref 0.0–0.5)
Eosinophils Relative: 14 %
HCT: 43 % (ref 39.0–52.0)
Hemoglobin: 14.9 g/dL (ref 13.0–17.0)
Immature Granulocytes: 1 %
Lymphocytes Relative: 10 %
Lymphs Abs: 1.4 10*3/uL (ref 0.7–4.0)
MCH: 33.4 pg (ref 26.0–34.0)
MCHC: 34.7 g/dL (ref 30.0–36.0)
MCV: 96.4 fL (ref 80.0–100.0)
Monocytes Absolute: 1.2 10*3/uL — ABNORMAL HIGH (ref 0.1–1.0)
Monocytes Relative: 9 %
Neutro Abs: 8.8 10*3/uL — ABNORMAL HIGH (ref 1.7–7.7)
Neutrophils Relative %: 66 %
Platelets: 320 10*3/uL (ref 150–400)
RBC: 4.46 MIL/uL (ref 4.22–5.81)
RDW: 11.9 % (ref 11.5–15.5)
WBC: 13.4 10*3/uL — ABNORMAL HIGH (ref 4.0–10.5)
nRBC: 0 % (ref 0.0–0.2)

## 2022-01-10 LAB — PHOSPHORUS: Phosphorus: 3.2 mg/dL (ref 2.5–4.6)

## 2022-01-10 LAB — MAGNESIUM: Magnesium: 1.9 mg/dL (ref 1.7–2.4)

## 2022-01-10 MED ORDER — SODIUM CHLORIDE 0.9 % IV SOLN
1.0000 g | INTRAVENOUS | Status: DC
  Administered 2022-01-11 – 2022-01-13 (×3): 1 g via INTRAVENOUS
  Filled 2022-01-10 (×3): qty 10

## 2022-01-10 MED ORDER — SODIUM CHLORIDE 0.9 % IV SOLN
2.0000 g | Freq: Once | INTRAVENOUS | Status: AC
  Administered 2022-01-10: 2 g via INTRAVENOUS
  Filled 2022-01-10: qty 20

## 2022-01-10 MED ORDER — THIAMINE HCL 100 MG/ML IJ SOLN: 100.0000 mg | Freq: Every day | INTRAMUSCULAR | Status: DC

## 2022-01-10 MED ORDER — ONDANSETRON HCL 4 MG/2ML IJ SOLN: 4.0000 mg | Freq: Four times a day (QID) | INTRAMUSCULAR | Status: DC | PRN

## 2022-01-10 MED ORDER — ACETAMINOPHEN 650 MG RE SUPP: 650.0000 mg | Freq: Four times a day (QID) | RECTAL | Status: DC | PRN

## 2022-01-10 MED ORDER — LORAZEPAM 1 MG PO TABS: 0.0000 mg | ORAL_TABLET | Freq: Two times a day (BID) | ORAL | Status: AC

## 2022-01-10 MED ORDER — OXYCODONE HCL 5 MG PO TABS: 5.0000 mg | ORAL_TABLET | Freq: Four times a day (QID) | ORAL | Status: DC | PRN

## 2022-01-10 MED ORDER — LORAZEPAM 1 MG PO TABS
0.0000 mg | ORAL_TABLET | Freq: Four times a day (QID) | ORAL | Status: AC
  Administered 2022-01-10 – 2022-01-12 (×6): 1 mg via ORAL
  Filled 2022-01-10 (×6): qty 1

## 2022-01-10 MED ORDER — NICOTINE 21 MG/24HR TD PT24
21.0000 mg | MEDICATED_PATCH | Freq: Every day | TRANSDERMAL | Status: DC
  Administered 2022-01-10 – 2022-01-14 (×5): 21 mg via TRANSDERMAL
  Filled 2022-01-10 (×5): qty 1

## 2022-01-10 MED ORDER — KETOROLAC TROMETHAMINE 15 MG/ML IJ SOLN
15.0000 mg | Freq: Once | INTRAMUSCULAR | Status: AC
  Administered 2022-01-10: 15 mg via INTRAVENOUS
  Filled 2022-01-10: qty 1

## 2022-01-10 MED ORDER — LORAZEPAM 1 MG PO TABS
1.0000 mg | ORAL_TABLET | ORAL | Status: AC | PRN
  Filled 2022-01-10: qty 1

## 2022-01-10 MED ORDER — FOLIC ACID 1 MG PO TABS
1.0000 mg | ORAL_TABLET | Freq: Every day | ORAL | Status: DC
  Administered 2022-01-10 – 2022-01-14 (×5): 1 mg via ORAL
  Filled 2022-01-10 (×5): qty 1

## 2022-01-10 MED ORDER — ADULT MULTIVITAMIN W/MINERALS CH
1.0000 | ORAL_TABLET | Freq: Every day | ORAL | Status: DC
  Administered 2022-01-10 – 2022-01-14 (×5): 1 via ORAL
  Filled 2022-01-10 (×5): qty 1

## 2022-01-10 MED ORDER — LORAZEPAM 2 MG/ML IJ SOLN: 1.0000 mg | INTRAMUSCULAR | Status: AC | PRN

## 2022-01-10 MED ORDER — VANCOMYCIN HCL IN DEXTROSE 1-5 GM/200ML-% IV SOLN
1000.0000 mg | Freq: Two times a day (BID) | INTRAVENOUS | Status: DC
  Administered 2022-01-11 – 2022-01-14 (×7): 1000 mg via INTRAVENOUS
  Filled 2022-01-10 (×10): qty 200

## 2022-01-10 MED ORDER — ACETAMINOPHEN 325 MG PO TABS: 650.0000 mg | ORAL_TABLET | Freq: Four times a day (QID) | ORAL | Status: DC | PRN

## 2022-01-10 MED ORDER — SILVER SULFADIAZINE 1 % EX CREA
TOPICAL_CREAM | Freq: Two times a day (BID) | CUTANEOUS | Status: DC
  Administered 2022-01-11 – 2022-01-14 (×4): 1 via TOPICAL
  Filled 2022-01-10 (×2): qty 50

## 2022-01-10 MED ORDER — THIAMINE MONONITRATE 100 MG PO TABS
100.0000 mg | ORAL_TABLET | Freq: Every day | ORAL | Status: DC
  Administered 2022-01-10 – 2022-01-14 (×5): 100 mg via ORAL
  Filled 2022-01-10 (×5): qty 1

## 2022-01-10 MED ORDER — SODIUM CHLORIDE 0.9 % IV BOLUS
500.0000 mL | Freq: Once | INTRAVENOUS | Status: AC
Start: 2022-01-10 — End: 2022-01-10
  Administered 2022-01-10: 500 mL via INTRAVENOUS

## 2022-01-10 MED ORDER — SODIUM CHLORIDE 0.9 % IV SOLN: INTRAVENOUS | Status: DC

## 2022-01-10 MED ORDER — MAGNESIUM SULFATE 2 GM/50ML IV SOLN
2.0000 g | Freq: Once | INTRAVENOUS | Status: AC
  Administered 2022-01-10: 2 g via INTRAVENOUS
  Filled 2022-01-10: qty 50

## 2022-01-10 MED ORDER — VANCOMYCIN HCL 1500 MG/300ML IV SOLN
1500.0000 mg | Freq: Once | INTRAVENOUS | Status: AC
  Administered 2022-01-10: 1500 mg via INTRAVENOUS
  Filled 2022-01-10: qty 300

## 2022-01-10 MED ORDER — ONDANSETRON HCL 4 MG PO TABS: 4.0000 mg | ORAL_TABLET | Freq: Four times a day (QID) | ORAL | Status: DC | PRN

## 2022-01-10 NOTE — Progress Notes (Signed)
Pharmacy Antibiotic Note  Jimmy [name] is a 67 y.o. male admitted on 01/10/2022 with cellulitis.  Pharmacy has been consulted for vancomycin dosing.  Plan: Vancomycin 1500 mg IV now, then 1000 mg IV q12 hr (est AUC 482 based on SCr 0.82; Vd 0.72) Measure vancomycin AUC at steady state as indicated SCr q48 while on vanc Ceftriaxone per MD; dosing appropriate  Height: 5\' 8"  (172.7 cm) Weight: 74.8 kg (165 lb) IBW/kg (Calculated) : 68.4  Temp (24hrs), Avg:98.8 F (37.1 C), Min:98.2 F (36.8 C), Max:99.9 F (37.7 C)  Recent Labs  Lab 01/10/22 1040  WBC 13.4*  CREATININE 0.82    Estimated Creatinine Clearance: 86.9 mL/min (by C-G formula based on SCr of 0.82 mg/dL).    Allergies  Allergen Reactions   Neosporin [Neomycin-Bacitracin Zn-Polymyx] Rash    Antimicrobials this admission: 10/20 vancomycin >>  10/20 ceftriaxone >>   Dose adjustments this admission: N/a  Microbiology results: Surgical/deep tissue Cx listed as ordered, but no mention of OR or bedside procedures in notes  Thank you for allowing pharmacy to be a part of this patient's care.  Naomii Kreger A 01/10/2022 7:35 PM
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
## 2022-01-10 NOTE — Consult Note (Addendum)
[facility] Nurse Consult Note: Reason for Consult: Consult requested for bilat feet.  Performed remotely after review of progress notes and photos in the EMR.  Wound type: Pt with full thickness skin loss to bilat plantar feet, between toes, and patchy areas to anterior feet. Loose peeling sheets of skin, large amt tan drainage and strong odor. Generalized cellulitis; appearance is consistent with "trench foot" as a result of prolonged wet feet; patient is homeless with limited wound care resources after discharge. They are on systemic antibiotics for the cellulitis. Dressing procedure/placement/frequency: Topical treatment orders provided for bedside nurses to perform to provide antimicrobial benefits and promote healing as follows: Wash feet with soap and water, then pat dry and apply Silvadene and ABD pads, then kerlex BID. Remove previous Silvadene each time by wiping with moist washcloth before applying more.  Please re-consult if further assistance is needed.  Thank-you,  Julien Girt MSN, [place], [place], [place], [place]

## 2022-01-10 NOTE — ED Provider Notes (Signed)
[facility] DEPT Provider Note   CSN: [id number] Arrival date & time: 01/10/22  8546     History {Add pertinent medical, surgical, social history, OB history to HPI:1} Chief Complaint  Patient presents with   Foot Pain    Jimmy [name] is a 66 y.o. male.  Patient has a history of schizophrenia and EtOH abuse.  Patient complains of pain to both feet.   Foot Pain       Home Medications Prior to Admission medications   Medication Sig Start Date End Date Taking? Authorizing Provider  folic acid (FOLVITE) 1 MG tablet Take 1 tablet (1 mg total) by mouth daily. Patient not taking: Reported on 06/21/2021 09/24/20   Modena Jansky, MD  Multiple Vitamin (MULTIVITAMIN WITH MINERALS) TABS tablet Take 1 tablet by mouth daily. Patient not taking: Reported on 06/21/2021 09/24/20   Modena Jansky, MD  omeprazole (PRILOSEC OTC) 20 MG tablet Take 20 mg by mouth daily as needed (for heartburn).    [provider]  terbinafine (LAMISIL) 1 % cream Apply topically 2 (two) times daily. 06/23/21   Imagene Sheller S, DO  thiamine 100 MG tablet Take 1 tablet (100 mg total) by mouth daily. Patient not taking: Reported on 06/21/2021 09/24/20   Modena Jansky, MD  famotidine (PEPCID) 20 MG tablet Take 1 tablet (20 mg total) by mouth 2 (two) times daily. 10/13/18 10/19/18  Charlesetta Shanks, MD      Allergies    Neosporin [neomycin-bacitracin zn-polymyx]    Review of Systems   Review of Systems  Physical Exam Updated Vital Signs BP (!) 123/95   Pulse (!) 118   Temp 99.9 F (37.7 C) (Oral)   Resp 18   Ht 5\' 8"  (1.727 m)   Wt 74.8 kg   SpO2 100%   BMI 25.09 kg/m  Physical Exam  ED Results / Procedures / Treatments   Labs (all labs ordered are listed, but only abnormal results are displayed) Labs Reviewed  CBC WITH DIFFERENTIAL/PLATELET - Abnormal; Notable for the following components:      Result Value   WBC 13.4 (*)    Neutro Abs 8.8 (*)     Monocytes Absolute 1.2 (*)    Eosinophils Absolute 1.9 (*)    All other components within normal limits  COMPREHENSIVE METABOLIC PANEL - Abnormal; Notable for the following components:   Sodium 125 (*)    Chloride 93 (*)    Glucose, Bld 106 (*)    Calcium 8.7 (*)    Total Protein 8.5 (*)    All other components within normal limits  ETHANOL    EKG None  Radiology DG Foot Complete Left  Result Date: 01/10/2022 CLINICAL DATA:  Bilateral foot pain and swelling. Oozing fairly confluent bilateral feet. EXAM: LEFT FOOT - COMPLETE 3+ VIEW COMPARISON:  Left foot radiographs 06/21/2021 FINDINGS: Redemonstration of severe great toe metatarsophalangeal joint space narrowing with bone-on-bone contact, subchondral sclerosis and cystic change, lateral articular surface erosions, and large dorsal and lateral degenerative osteophytes. Ossicle measuring approximately 11 mm dorsal to the joint space on lateral view, similar to prior. Mild joint space narrowing of the interphalangeal joints diffusely. Mild degenerative spurring at the lateral base of the first metatarsal at the articulation with the base of the second metatarsal. Small plantar calcaneal heel spur. Minimal chronic enthesopathic change at the Achilles insertion on the calcaneus. No definite soft tissue ulcer is seen. No cortical erosion. IMPRESSION: 1. Severe great toe metatarsophalangeal joint  osteoarthritis, similar to prior. 2. No definite soft tissue ulcer is seen. No cortical erosion to suggest osteomyelitis. Electronically Signed   By: Neita Garnet M.D.   On: 01/10/2022 10:22   DG Foot Complete Right  Result Date: 01/10/2022 CLINICAL DATA:  Bilateral foot pain and swelling. Oozing clear liquid from bilateral feet. EXAM: RIGHT FOOT COMPLETE - 3+ VIEW COMPARISON:  Right tibia and fibula radiographs 04/27/2012 FINDINGS: Severe great toe metatarsophalangeal joint space narrowing, subchondral sclerosis, and peripheral osteophytosis. Chronic  well corticated ossicle measuring up to approximately 11 mm at the dorsal aspect of the joint with large osteophyte extending dorsally and posteriorly from the metatarsal head, seen on lateral view. Mild joint space narrowing of the interphalangeal joints diffusely. Tiny plantar and posterior calcaneal heel spurs. No acute fracture or dislocation. No cortical erosion. No subcutaneous air. IMPRESSION: 1. Severe great toe metatarsophalangeal joint osteoarthritis. 2. No cortical erosion to indicate radiographic evidence of osteomyelitis. 3. Tiny plantar and posterior calcaneal heel spurs. Electronically Signed   By: Neita Garnet M.D.   On: 01/10/2022 10:20    Procedures Procedures  {Document cardiac monitor, telemetry assessment procedure when appropriate:1}  Medications Ordered in ED Medications  cefTRIAXone (ROCEPHIN) 2 g in sodium chloride 0.9 % 100 mL IVPB (has no administration in time range)  sodium chloride 0.9 % bolus 500 mL (has no administration in time range)    ED Course/ Medical Decision Making/ A&P                           Medical Decision Making Amount and/or Complexity of Data Reviewed Labs: ordered. Radiology: ordered.  Risk Decision regarding hospitalization.   Patient with hyponatremia and cellulitis to both feet.  He will be admitted to medicine  {Document critical care time when appropriate:1} {Document review of labs and clinical decision tools ie heart score, Chads2Vasc2 etc:1}  {Document your independent review of radiology images, and any outside records:1} {Document your discussion with family members, caretakers, and with consultants:1} {Document social determinants of health affecting pt's care:1} {Document your decision making why or why not admission, treatments were needed:1} Final Clinical Impression(s) / ED Diagnoses Final diagnoses:  Cellulitis of foot    Rx / DC Orders ED Discharge Orders     None

## 2022-01-10 NOTE — ED Triage Notes (Signed)
Pt bib ems from [facility] c/o bilateral foot pain, etoh.  210/102 98 hr  95%room air  Cbg 99

## 2022-01-10 NOTE — Consult Note (Signed)
  Subjective:  Patient ID: Jimmy [name], male    DOB: [birth year],  MRN: [id number]   A 66 y.o. male medical history significant of alcohol abuse, alcohol withdrawal seizure, bipolar 1 disorder, history of schizophrenia, depression, gallstones, GERD, PUD, history of upper GI bleed, hyponatremia, tobacco use disorder, polysubstance abuse presents to the emergency room with bilateral foot pain.  Patient states he was walking around with his socks on which may have gotten wet for over 2 days.  He states it started getting red soon after that and started causing him a lot of pain.  He wanted to get it evaluated denies any other acute complaints.  No nausea fever chills vomiting.  Objective:   Vitals:   01/10/22 1530 01/10/22 1630  BP: 128/76 (!) 145/73  Pulse: 78 84  Resp: 16 17  Temp: 98.2 F (36.8 C) 98.2 F (36.8 C)  SpO2: 100% 100%   General AA&O x3. Normal mood and affect.  Vascular Dorsalis pedis and posterior tibial pulses 2/4 bilat. Brisk capillary refill to all digits. Pedal hair present.  Neurologic Epicritic sensation grossly intact.  Dermatologic No open wounds or lesion noted.  Severe maceration noted likely due to prolonged wetness.  Cellulitis noted up to the midfoot/ankle.  No open wounds or lesion noted no malodor present.  Orthopedic: MMT 5/5 in dorsiflexion, plantarflexion, inversion, and eversion. Normal joint ROM without pain or crepitus.           Assessment & Plan:  Patient was evaluated and treated and all questions answered.  Bilateral cellulitis with underlying concern for trench foot -All questions and concerns were discussed with the patient in extensive detail -Patient will benefit from aggressive Betadine wet-to-dry dressing changes daily -He will need IV antibiotics for the redness for 48 hours.  He can be discharged on p.o. antibiotics for 10 days doxycycline -Weightbearing as tolerated to bilateral foot -I encouraged the importance of keeping the  bilateral foot dry.  He states understanding -He can see ups in clinic 1 week from discharge. -Podiatry to sign off if any foot and ankle issues please reconsult [place]  Felipa Furnace, DPM  Accessible via secure chat for questions or concerns.

## 2022-01-11 ENCOUNTER — Inpatient Hospital Stay (HOSPITAL_COMMUNITY): Payer: Medicare Other

## 2022-01-11 DIAGNOSIS — L03115 Cellulitis of right lower limb: Secondary | ICD-10-CM | POA: Diagnosis not present

## 2022-01-11 DIAGNOSIS — K2100 Gastro-esophageal reflux disease with esophagitis, without bleeding: Secondary | ICD-10-CM | POA: Diagnosis not present

## 2022-01-11 DIAGNOSIS — Z5900 Homelessness unspecified: Secondary | ICD-10-CM

## 2022-01-11 DIAGNOSIS — F1010 Alcohol abuse, uncomplicated: Secondary | ICD-10-CM | POA: Diagnosis not present

## 2022-01-11 DIAGNOSIS — E871 Hypo-osmolality and hyponatremia: Secondary | ICD-10-CM

## 2022-01-11 DIAGNOSIS — B86 Scabies: Secondary | ICD-10-CM

## 2022-01-11 DIAGNOSIS — F17200 Nicotine dependence, unspecified, uncomplicated: Secondary | ICD-10-CM

## 2022-01-11 LAB — COMPREHENSIVE METABOLIC PANEL
ALT: 12 U/L (ref 0–44)
AST: 16 U/L (ref 15–41)
Albumin: 3 g/dL — ABNORMAL LOW (ref 3.5–5.0)
Alkaline Phosphatase: 54 U/L (ref 38–126)
Anion gap: 5 (ref 5–15)
BUN: 11 mg/dL (ref 8–23)
CO2: 22 mmol/L (ref 22–32)
Calcium: 7.9 mg/dL — ABNORMAL LOW (ref 8.9–10.3)
Chloride: 102 mmol/L (ref 98–111)
Creatinine, Ser: 0.66 mg/dL (ref 0.61–1.24)
GFR, Estimated: >60 mL/min (ref >60)
Glucose, Bld: 97 mg/dL (ref 70–99)
Potassium: 4.1 mmol/L (ref 3.5–5.1)
Sodium: 129 mmol/L — ABNORMAL LOW (ref 135–145)
Total Bilirubin: 0.6 mg/dL (ref 0.3–1.2)
Total Protein: 6.2 g/dL — ABNORMAL LOW (ref 6.5–8.1)

## 2022-01-11 LAB — CBC
HCT: 37.4 % — ABNORMAL LOW (ref 39.0–52.0)
Hemoglobin: 12.6 g/dL — ABNORMAL LOW (ref 13.0–17.0)
MCH: 32.8 pg (ref 26.0–34.0)
MCHC: 33.7 g/dL (ref 30.0–36.0)
MCV: 97.4 fL (ref 80.0–100.0)
Platelets: 279 10*3/uL (ref 150–400)
RBC: 3.84 MIL/uL — ABNORMAL LOW (ref 4.22–5.81)
RDW: 11.9 % (ref 11.5–15.5)
WBC: 8.7 10*3/uL (ref 4.0–10.5)
nRBC: 0 % (ref 0.0–0.2)

## 2022-01-11 LAB — MRSA NEXT GEN BY PCR, NASAL: MRSA by PCR Next Gen: DETECTED — AB

## 2022-01-11 LAB — RESP PANEL BY RT-PCR (FLU A&B, COVID) ARPGX2
Influenza A by PCR: NEGATIVE
Influenza B by PCR: NEGATIVE
SARS Coronavirus 2 by RT PCR: NEGATIVE

## 2022-01-11 LAB — HIV ANTIBODY (ROUTINE TESTING W REFLEX): HIV Screen 4th Generation wRfx: NONREACTIVE

## 2022-01-11 MED ORDER — CHLORHEXIDINE GLUCONATE CLOTH 2 % EX PADS
6.0000 | MEDICATED_PAD | Freq: Every day | CUTANEOUS | Status: DC
  Administered 2022-01-11 – 2022-01-14 (×4): 6 via TOPICAL

## 2022-01-11 MED ORDER — PERMETHRIN 5 % EX CREA
TOPICAL_CREAM | Freq: Once | CUTANEOUS | Status: AC
  Filled 2022-01-11: qty 60

## 2022-01-11 MED ORDER — MUPIROCIN 2 % EX OINT
1.0000 | TOPICAL_OINTMENT | Freq: Two times a day (BID) | CUTANEOUS | Status: DC
  Administered 2022-01-11 – 2022-01-14 (×7): 1 via NASAL
  Filled 2022-01-11: qty 22

## 2022-01-11 MED ORDER — DIPHENHYDRAMINE HCL 25 MG PO CAPS: 25.0000 mg | ORAL_CAPSULE | Freq: Four times a day (QID) | ORAL | Status: DC | PRN

## 2022-01-11 MED ORDER — CHLORHEXIDINE GLUCONATE CLOTH 2 % EX PADS: 6.0000 | MEDICATED_PAD | Freq: Every day | CUTANEOUS | Status: DC

## 2022-01-11 NOTE — Progress Notes (Signed)
PROGRESS NOTE    Jimmy [name]  [id number] DOB: 18-Jan-1956 DOA: 01/10/2022 PCP: Patient, No Pcp Per   Brief Narrative:  The patient is a 66 year old overweight chronically ill-appearing Caucasian male with past medical history significant for prolonged to alcohol abuse and alcohol withdrawal seizures, bipolar 1 disorder, history of schizophrenia, depression, history of gallstones, GERD, peptic ulcer disease, history of upper GI bleeding, hyponatremia, tobacco abuse disorder, history of polysubstance abuse as well as other comorbidities who came to the emergency department due to bilateral foot pain.  Patient is currently homeless and lives behind the [address] off of [place] and has been walking with no shoes on and only socks given the holes in his shoes.  He was noted that his feet were getting red and developed blisters and he denied any fevers, chills, rhinorrhea, sore throat but is having a cough.  He denies chest pain or shortness of breath and denies any abdominal pain.  Started scratching his fingers and states he became itchy.  Because he had this bilateral foot pain he presented to the ED for further evaluation and lab work showed a slight leukocytosis and elevated glucose level.  Bilateral x-rays of her feet were done and showed no soft tissue or fluid or evidence of osteomyelitis.  Given the erythema in his foot and concern podiatry was consulted and there is concern that he may have transferred but podiatry evaluated and recommending dressing changes and IV antibiotics.  Given that the patient started itching in his hands today is evaluated and it looks like that he may have scabies so is started on permethrin cream and given his cough a chest x-ray and a COVID respiratory virus panel test is being pursued.  Assessment and Plan: No notes have been filed under this hospital service. Service: Hospitalist  Cellulitis of both feet with concern for trench foot -Admit to  MedSurg/inpatient. -Analgesics as needed and is on Oxycodone 5 mg q 6 hr PRN and Ketorolac 15 mg IVP x1. -Continue supportive care with ondansetron 4 mg p.o./IV every 6 as needed -Continue Ceftriaxone 1 g IVPB daily and IV vancomycin at 1000 mg every 12 -WBC went from 13.4 -> 8.7 -Patient is MRSA positive -Consult wound care and they are recommending topical treatment orders provided for bedside nurses to perform with washing the feet with soap and water and patting dry and applying Silvadene and ABD pads and Kerlix and removing previous sodium by wiping with a moist washcloth before applying more -Podiatry was consulted and they had recommended Betadine wet-to-dry dressing changes daily however after reviewing the chart they feel that the wound care nurse orders are appropriate -They are recommending weightbearing as tolerated bilateral foot -They are recommending keeping the bilateral foot dry and recommending IV antibiotics for erythema for at least 48 hours while hospitalized and discharged on p.o. Doxycycline   Hyponatremia -Secondary to beer potomania. -Fluid restriction of 1500 mL/day. -Begin normal saline at 100 mL/h. -Patient sodium went from 125 is now 129 Further to monitor trend and repeat CMP in a.m.   Tobacco Use Disorder -Tobacco cessation counseling given -Continue with nicotine patch   Alcohol Abuse -CIWA protocol with Lorazepam. -Magnesium sulfate supplementation. -Folate, MVI and thiamine. -Consult TOC.  Scabies -Place on Contact Precaution -Started Benadryl 25 mg p.o. daily every 6 as needed for itching -Has Lesions on had where he has itched in the typical location for scabies eggs in the webs of the fingers -Permethrin Cream 5% Topically from Neck Down  to the Soles of Feet   Cough with Upper Nasal Congestion -Check COVID 19, Influenza A/B PCR -Check Respiratory Virus Panel -CXR not done on Admission so ordering one now -Will give Flutter Valve and  Incentive Spirometry   Hypoalbuminemia  -Patient's Albumin Level went from 4.0 -> 3.0 -Continue to Monitor and Trend -Repeat CMP in the AM   Normocytic Anemia  -Patient's Hgb/Hct went from 14.9/43.0 -> 12.6/37.4 -Check Anemia Panel in the AM  -Continue to Monitor for S/Sx of Bleeding; No overt bleeding noted -Repeat CBC in the AM    GERD (gastroesophageal reflux disease)/GI Prophylaxis  -Also has history of peptic ulcer disease. -Continue PPI with Pantoprazole 40 mg p.o. daily.   Homeless -Consult transitional care team.  States he was off behind the [employer] [place] and sleeps on a roll out bed -Consider psych evaluation if needed.   DVT prophylaxis: SCDs Start: 01/10/22 1145    Code Status: Full Code Family Communication: No family currently at bedside  Disposition Plan:  Level of care: Med-Surg Status is: Inpatient Remains inpatient appropriate because: Patient's feet are peeling   Consultants:  Podiatry  Procedures:  As delineated as above  Antimicrobials:  Anti-infectives (From admission, onward)    Start     Dose/Rate Route Frequency Ordered Stop   01/11/22 1200  cefTRIAXone (ROCEPHIN) 1 g in sodium chloride 0.9 % 100 mL IVPB        1 g 200 mL/hr over 30 Minutes Intravenous Every 24 hours 01/10/22 1151 01/18/22 1159   01/11/22 0600  vancomycin (VANCOCIN) IVPB 1000 mg/200 mL premix        1,000 mg 200 mL/hr over 60 Minutes Intravenous Every 12 hours 01/10/22 1933     01/10/22 2030  vancomycin (VANCOREADY) IVPB 1500 mg/300 mL        1,500 mg 150 mL/hr over 120 Minutes Intravenous  Once 01/10/22 1933 01/11/22 0022   01/10/22 1130  cefTRIAXone (ROCEPHIN) 2 g in sodium chloride 0.9 % 100 mL IVPB        2 g 200 mL/hr over 30 Minutes Intravenous  Once 01/10/22 1128 01/10/22 1335      Subjective: Seen and examined at bedside and he is resting but he states his hands are very itchy.  Denies any nausea or vomiting.  States that he still has some pain in his  feet.  Feels okay.  No other concerns or complaints at this time.  Objective: Vitals:   01/10/22 2035 01/11/22 0045 01/11/22 0445 01/11/22 0743  BP: 136/69 139/73 134/86 137/76  Pulse: 86 95 96 75  Resp: 18 16 18    Temp: 98.2 F (36.8 C) 98.6 F (37 C) 97.6 F (36.4 C)   TempSrc: Oral Oral Oral   SpO2: 100% 99% 99%   Weight:      Height:        Intake/Output Summary (Last 24 hours) at 01/11/2022 0856 Last data filed at 01/11/2022 [id number] Gross per 24 hour  Intake 650 ml  Output 900 ml  Net -250 ml   Filed Weights   01/10/22 0244  Weight: 74.8 kg   Examination: Physical Exam:  Constitutional: WN/WD overweight chronically ill-appearing Caucasian male in no acute distress appears calm with slightly uncomfortable Respiratory: Diminished to auscultation bilaterally with coarse breath sounds, no wheezing, rales, rhonchi or crackles. Normal respiratory effort and patient is not tachypenic. No accessory muscle use.  Patient has a wet sounding cough and some upper respiratory nasal drainage Cardiovascular: RRR, no murmurs /  rubs / gallops. S1 and S2 auscultated.  Has some mostly edema Abdomen: Soft, non-tender, mildly distended. Bowel sounds positive.  GU: Deferred. Musculoskeletal: Bilateral feet are erythematous and scaly and swollen Skin: + Itching on his hands and lesions consistent with scabies and bilateral feet that is erythematous and has some lesions present Neurologic: CN 2-12 grossly intact with no focal deficits. Romberg sign and cerebellar reflexes not assessed.  Psychiatric: Normal judgment and insight. Alert and oriented x 3. Normal mood and appropriate affect.   Data Reviewed: I have personally reviewed following labs and imaging studies  CBC: Recent Labs  Lab 01/10/22 1040 01/11/22 0526  WBC 13.4* 8.7  NEUTROABS 8.8*  --   HGB 14.9 12.6*  HCT 43.0 37.4*  MCV 96.4 97.4  PLT 320 [id number]   Basic Metabolic Panel: Recent Labs  Lab 01/10/22 1040 01/11/22 0526   NA 125* 129*  K 4.9 4.1  CL 93* 102  CO2 24 22  GLUCOSE 106* 97  BUN 8 11  CREATININE 0.82 0.66  CALCIUM 8.7* 7.9*  MG 1.9  --   PHOS 3.2  --    GFR: Estimated Creatinine Clearance: 89.1 mL/min (by C-G formula based on SCr of 0.66 mg/dL). Liver Function Tests: Recent Labs  Lab 01/10/22 1040 01/11/22 0526  AST 26 16  ALT 17 12  ALKPHOS 70 54  BILITOT 1.2 0.6  PROT 8.5* 6.2*  ALBUMIN 4.0 3.0*   No results for input(s): "LIPASE", "AMYLASE" in the last 168 hours. No results for input(s): "AMMONIA" in the last 168 hours. Coagulation Profile: No results for input(s): "INR", "PROTIME" in the last 168 hours. Cardiac Enzymes: No results for input(s): "CKTOTAL", "CKMB", "CKMBINDEX", "TROPONINI" in the last 168 hours. BNP (last 3 results) No results for input(s): "PROBNP" in the last 8760 hours. HbA1C: No results for input(s): "HGBA1C" in the last 72 hours. CBG: No results for input(s): "GLUCAP" in the last 168 hours. Lipid Profile: No results for input(s): "CHOL", "HDL", "LDLCALC", "TRIG", "CHOLHDL", "LDLDIRECT" in the last 72 hours. Thyroid Function Tests: No results for input(s): "TSH", "T4TOTAL", "FREET4", "T3FREE", "THYROIDAB" in the last 72 hours. Anemia Panel: No results for input(s): "VITAMINB12", "FOLATE", "FERRITIN", "TIBC", "IRON", "RETICCTPCT" in the last 72 hours. Sepsis Labs: No results for input(s): "PROCALCITON", "LATICACIDVEN" in the last 168 hours.  Recent Results (from the past 240 hour(s))  MRSA Next Gen by PCR, Nasal     Status: Abnormal   Collection Time: 01/10/22  7:18 PM   Specimen: Nasal Mucosa; Nasal Swab  Result Value Ref Range Status   MRSA by PCR Next Gen DETECTED (A) NOT DETECTED Final    Comment: CRITICAL RESULT CALLED TO, READ BACK BY AND VERIFIED WITH: KAUR,R AT 0005 ON 01/11/22 BY LUZOLOP (NOTE) The GeneXpert MRSA Assay (FDA approved for NASAL specimens only), is one component of a comprehensive MRSA colonization surveillance program.  It is not intended to diagnose MRSA infection nor to guide or monitor treatment for MRSA infections. Test performance is not FDA approved in patients less than 65 years old. Performed at [facility], [place] [address], [place], [place] [address]   Aerobic/Anaerobic Culture w Gram Stain (surgical/deep wound)     Status: None (Preliminary result)   Collection Time: 01/10/22  7:19 PM   Specimen: Foot  Result Value Ref Range Status   Specimen Description   Final    FOOT PUSTULES FROM BILATERAL FEEL Performed at [place] [address] [place], [place] [address]  Special Requests   Final    NONE Performed at [facility], [place] [address], [place], [place] [address]    Gram Stain   Final    NO WBC SEEN MODERATE GRAM POSITIVE COCCI IN PAIRS FEW GRAM POSITIVE RODS RARE GRAM NEGATIVE RODS Performed at [facility], [place] [address] [place], [place] [address]    Culture PENDING  Incomplete   Report Status PENDING  Incomplete    Radiology Studies: DG Foot Complete Left  Result Date: 01/10/2022 CLINICAL DATA:  Bilateral foot pain and swelling. Oozing fairly confluent bilateral feet. EXAM: LEFT FOOT - COMPLETE 3+ VIEW COMPARISON:  Left foot radiographs 06/21/2021 FINDINGS: Redemonstration of severe great toe metatarsophalangeal joint space narrowing with bone-on-bone contact, subchondral sclerosis and cystic change, lateral articular surface erosions, and large dorsal and lateral degenerative osteophytes. Ossicle measuring approximately 11 mm dorsal to the joint space on lateral view, similar to prior. Mild joint space narrowing of the interphalangeal joints diffusely. Mild degenerative spurring at the lateral base of the first metatarsal at the articulation with the base of the second metatarsal. Small plantar calcaneal heel spur. Minimal chronic enthesopathic change at the Achilles insertion on the calcaneus. No definite soft  tissue ulcer is seen. No cortical erosion. IMPRESSION: 1. Severe great toe metatarsophalangeal joint osteoarthritis, similar to prior. 2. No definite soft tissue ulcer is seen. No cortical erosion to suggest osteomyelitis. Electronically Signed   By: Yvonne Kendall M.D.   On: 01/10/2022 10:22   DG Foot Complete Right  Result Date: 01/10/2022 CLINICAL DATA:  Bilateral foot pain and swelling. Oozing clear liquid from bilateral feet. EXAM: RIGHT FOOT COMPLETE - 3+ VIEW COMPARISON:  Right tibia and fibula radiographs 04/27/2012 FINDINGS: Severe great toe metatarsophalangeal joint space narrowing, subchondral sclerosis, and peripheral osteophytosis. Chronic well corticated ossicle measuring up to approximately 11 mm at the dorsal aspect of the joint with large osteophyte extending dorsally and posteriorly from the metatarsal head, seen on lateral view. Mild joint space narrowing of the interphalangeal joints diffusely. Tiny plantar and posterior calcaneal heel spurs. No acute fracture or dislocation. No cortical erosion. No subcutaneous air. IMPRESSION: 1. Severe great toe metatarsophalangeal joint osteoarthritis. 2. No cortical erosion to indicate radiographic evidence of osteomyelitis. 3. Tiny plantar and posterior calcaneal heel spurs. Electronically Signed   By: Yvonne Kendall M.D.   On: 01/10/2022 10:20    Scheduled Meds:  Chlorhexidine Gluconate Cloth  6 each Topical K7425   folic acid  1 mg Oral Daily   LORazepam  0-4 mg Oral Q6H   Followed by   Derrill Memo ON 01/12/2022] LORazepam  0-4 mg Oral Q12H   multivitamin with minerals  1 tablet Oral Daily   mupirocin ointment  1 Application Nasal BID   nicotine  21 mg Transdermal Daily   silver sulfADIAZINE   Topical BID   thiamine  100 mg Oral Daily   Or   thiamine  100 mg Intravenous Daily   Continuous Infusions:  sodium chloride 100 mL/hr at 01/10/22 2017   cefTRIAXone (ROCEPHIN)  IV     vancomycin 1,000 mg (01/11/22 0655)    LOS: 1 day   Raiford Noble, DO [facility] Available via Epic secure chat 7am-7pm After these hours, please refer to coverage provider listed on [email or web address] 01/11/2022, 8:56 AM

## 2022-01-12 DIAGNOSIS — K2100 Gastro-esophageal reflux disease with esophagitis, without bleeding: Secondary | ICD-10-CM | POA: Diagnosis not present

## 2022-01-12 DIAGNOSIS — F1010 Alcohol abuse, uncomplicated: Secondary | ICD-10-CM | POA: Diagnosis not present

## 2022-01-12 DIAGNOSIS — L03115 Cellulitis of right lower limb: Secondary | ICD-10-CM | POA: Diagnosis not present

## 2022-01-12 DIAGNOSIS — Z5900 Homelessness unspecified: Secondary | ICD-10-CM | POA: Diagnosis not present

## 2022-01-12 LAB — RESPIRATORY PANEL BY PCR

## 2022-01-12 LAB — COMPREHENSIVE METABOLIC PANEL
ALT: 12 U/L (ref 0–44)
AST: 18 U/L (ref 15–41)
Albumin: 2.9 g/dL — ABNORMAL LOW (ref 3.5–5.0)
Alkaline Phosphatase: 49 U/L (ref 38–126)
Anion gap: 7 (ref 5–15)
BUN: 9 mg/dL (ref 8–23)
CO2: 23 mmol/L (ref 22–32)
Calcium: 8.4 mg/dL — ABNORMAL LOW (ref 8.9–10.3)
Chloride: 101 mmol/L (ref 98–111)
Creatinine, Ser: 0.69 mg/dL (ref 0.61–1.24)
GFR, Estimated: >60 mL/min (ref >60)
Glucose, Bld: 114 mg/dL — ABNORMAL HIGH (ref 70–99)
Potassium: 4 mmol/L (ref 3.5–5.1)
Sodium: 131 mmol/L — ABNORMAL LOW (ref 135–145)
Total Bilirubin: 0.4 mg/dL (ref 0.3–1.2)
Total Protein: 6.2 g/dL — ABNORMAL LOW (ref 6.5–8.1)

## 2022-01-12 LAB — IRON AND TIBC
Iron: 32 ug/dL — ABNORMAL LOW (ref 45–182)
Saturation Ratios: 14 % — ABNORMAL LOW (ref 17.9–39.5)
TIBC: 231 ug/dL — ABNORMAL LOW (ref 250–450)
UIBC: 199 ug/dL

## 2022-01-12 LAB — CBC WITH DIFFERENTIAL/PLATELET
Abs Immature Granulocytes: 0.03 10*3/uL (ref 0.00–0.07)
Basophils Absolute: 0.1 10*3/uL (ref 0.0–0.1)
Basophils Relative: 1 %
Eosinophils Absolute: 3 10*3/uL — ABNORMAL HIGH (ref 0.0–0.5)
Eosinophils Relative: 33 %
HCT: 36.1 % — ABNORMAL LOW (ref 39.0–52.0)
Hemoglobin: 12.3 g/dL — ABNORMAL LOW (ref 13.0–17.0)
Immature Granulocytes: 0 %
Lymphocytes Relative: 15 %
Lymphs Abs: 1.4 10*3/uL (ref 0.7–4.0)
MCH: 33.3 pg (ref 26.0–34.0)
MCHC: 34.1 g/dL (ref 30.0–36.0)
MCV: 97.8 fL (ref 80.0–100.0)
Monocytes Absolute: 1 10*3/uL (ref 0.1–1.0)
Monocytes Relative: 11 %
Neutro Abs: 3.6 10*3/uL (ref 1.7–7.7)
Neutrophils Relative %: 40 %
Platelets: 295 10*3/uL (ref 150–400)
RBC: 3.69 MIL/uL — ABNORMAL LOW (ref 4.22–5.81)
RDW: 11.9 % (ref 11.5–15.5)
WBC: 9.1 10*3/uL (ref 4.0–10.5)
nRBC: 0 % (ref 0.0–0.2)

## 2022-01-12 LAB — FERRITIN: Ferritin: 132 ng/mL (ref 24–336)

## 2022-01-12 LAB — VITAMIN B12: Vitamin B-12: 170 pg/mL — ABNORMAL LOW (ref 180–914)

## 2022-01-12 LAB — FOLATE: Folate: 9 ng/mL (ref >5.9)

## 2022-01-12 LAB — RETICULOCYTES
Immature Retic Fract: 12.3 % (ref 2.3–15.9)
RBC.: 3.69 MIL/uL — ABNORMAL LOW (ref 4.22–5.81)
Retic Count, Absolute: 76.3 10*3/uL (ref 19.0–186.0)
Retic Ct Pct: 2.1 % (ref 0.4–3.1)

## 2022-01-12 LAB — PHOSPHORUS: Phosphorus: 3.8 mg/dL (ref 2.5–4.6)

## 2022-01-12 LAB — MAGNESIUM: Magnesium: 1.9 mg/dL (ref 1.7–2.4)

## 2022-01-12 MED ORDER — CYANOCOBALAMIN 1000 MCG/ML IJ SOLN
1000.0000 ug | Freq: Once | INTRAMUSCULAR | Status: AC
  Administered 2022-01-12: 1000 ug via INTRAMUSCULAR
  Filled 2022-01-12: qty 1

## 2022-01-12 MED ORDER — POLYSACCHARIDE IRON COMPLEX 150 MG PO CAPS
150.0000 mg | ORAL_CAPSULE | Freq: Every day | ORAL | Status: DC
  Administered 2022-01-12 – 2022-01-14 (×3): 150 mg via ORAL
  Filled 2022-01-12 (×3): qty 1

## 2022-01-12 MED ORDER — VITAMIN B-12 1000 MCG PO TABS
1000.0000 ug | ORAL_TABLET | Freq: Every day | ORAL | Status: DC
  Administered 2022-01-13 – 2022-01-14 (×2): 1000 ug via ORAL
  Filled 2022-01-12 (×2): qty 1

## 2022-01-12 NOTE — Progress Notes (Signed)
PROGRESS NOTE    Jimmy [name]  [id number] DOB: 04/17/1955 DOA: 01/10/2022 PCP: Patient, No Pcp Per   Brief Narrative:  The patient is a 66 year old overweight chronically ill-appearing Caucasian male with past medical history significant for prolonged to alcohol abuse and alcohol withdrawal seizures, bipolar 1 disorder, history of schizophrenia, depression, history of gallstones, GERD, peptic ulcer disease, history of upper GI bleeding, hyponatremia, tobacco abuse disorder, history of polysubstance abuse as well as other comorbidities who came to the emergency department due to bilateral foot pain.  Patient is currently homeless and lives behind the [place] off of [address] and has been walking with no shoes on and only socks given the holes in his shoes.  He was noted that his feet were getting red and developed blisters and he denied any fevers, chills, rhinorrhea, sore throat but is having a cough.  He denies chest pain or shortness of breath and denies any abdominal pain.  Started scratching his fingers and states he became itchy.  Because he had this bilateral foot pain he presented to the ED for further evaluation and lab work showed a slight leukocytosis and elevated glucose level.  Bilateral x-rays of her feet were done and showed no soft tissue or fluid or evidence of osteomyelitis.  Given the erythema in his foot and concern podiatry was consulted and there is concern that he may have transferred but podiatry evaluated and recommending dressing changes and IV antibiotics.   Given that the patient started itching in his hands today and looks like typical lesions of scabies so is started on permethrin cream and given his cough a chest x-ray and a COVID respiratory virus panel test is being pursued.  Chest x-ray was negative and COVID testing was negative but respiratory virus panel is pending.   Assessment and Plan: No notes have been filed under this hospital  service. Service: Hospitalist  Cellulitis of both feet with concern for trench foot -Admit to MedSurg/inpatient. -Analgesics as needed and is on Oxycodone 5 mg q 6 hr PRN and Ketorolac 15 mg IVP x1. -Continue supportive care with ondansetron 4 mg p.o./IV every 6 as needed -Continue Ceftriaxone 1 g IVPB daily and IV vancomycin at 1000 mg every 12 -WBC went from 13.4 -> 8.7 and is now 9.1 -Patient is MRSA positive -Consult wound care and they are recommending topical treatment orders provided for bedside nurses to perform with washing the feet with soap and water and patting dry and applying Silvadene and ABD pads and Kerlix and removing previous sodium by wiping with a moist washcloth before applying more -Podiatry was consulted and they had recommended Betadine wet-to-dry dressing changes daily however after reviewing the chart they feel that the wound care nurse orders are appropriate -They are recommending weightbearing as tolerated bilateral foot -They are recommending keeping the bilateral foot dry and recommending IV antibiotics for erythema for at least 48 hours while hospitalized and discharged on p.o. Doxycycline but for now we will continue IV antibiotics and reassess his wound tomorrow   Hyponatremia -Secondary to beer potomania. -Fluid restriction of 1200 MLS per minute mL/day. -Begin normal saline at 100 mL/h. -Patient sodium went from 125 is now 129 yesterday and is now 131 Further to monitor trend and repeat CMP in a.m.   Tobacco Use Disorder -Tobacco cessation counseling given -Continue with nicotine patch   Alcohol Abuse -CIWA protocol with Lorazepam. -Magnesium sulfate supplementation. -Folate, MVI and thiamine. -Consult TOC.   Scabies -Place on Contact Precaution -  Started Benadryl 25 mg p.o. daily every 6 as needed for itching -Has Lesions on had where he has itched in the typical location for scabies eggs in the webs of the fingers -Permethrin Cream 5%  Topically from Neck Down to the Soles of Feet  -Patient states his itching is improved significantly now   Cough with Upper Nasal Congestion -Check COVID 19, Influenza A/B PCR and this was negative so we will discontinue airborne precautions -Check Respiratory Virus Panel is still pending -CXR not done on Admission but 1 was ordered yesterday and showed no active disease -Will give Flutter Valve and Incentive Spirometry  -Respiratory status is improving   Hypoalbuminemia  -Patient's Albumin Level went from 4.0 -> 3.0 and today now 2.9 -Continue to Monitor and Trend -Repeat CMP in the AM    Normocytic Anemia  -Patient's Hgb/Hct went from 14.9/43.0 -> 12.6/37.4 and is now 12.6/36.1 -Checked anemia panel showed an iron level of 32, U IBC of 199, TIBC of 231, saturation ratios of 14%, ferritin level 132, folate level 9.0, vitamin B12 level 170 -We will start iron supplementation -Continue to Monitor for S/Sx of Bleeding; No overt bleeding noted -Repeat CBC in the AM    GERD (gastroesophageal reflux disease)/GI Prophylaxis  -Also has history of peptic ulcer disease. -Continue PPI with Pantoprazole 40 mg p.o. daily.   Homeless -Consult transitional care team.  States he was off behind the [employer] [place] and sleeps on a roll out bed -Consider psych evaluation if needed.  DVT prophylaxis: SCDs Start: 01/10/22 1145    Code Status: Full Code Family Communication: No family currently at bedside  Disposition Plan:  Level of care: Med-Surg Status is: Inpatient Remains inpatient appropriate because: Continue IV antibiotics today and reevaluate pain in the morning and if stable can likely be changed to oral antibiotics and discharged  Consultants:  None  Procedures:  As delineated as above  Antimicrobials:  Anti-infectives (From admission, onward)    Start     Dose/Rate Route Frequency Ordered Stop   01/11/22 1200  cefTRIAXone (ROCEPHIN) 1 g in sodium chloride 0.9 % 100 mL  IVPB        1 g 200 mL/hr over 30 Minutes Intravenous Every 24 hours 01/10/22 1151 01/18/22 1159   01/11/22 0600  vancomycin (VANCOCIN) IVPB 1000 mg/200 mL premix        1,000 mg 200 mL/hr over 60 Minutes Intravenous Every 12 hours 01/10/22 1933     01/10/22 2030  vancomycin (VANCOREADY) IVPB 1500 mg/300 mL        1,500 mg 150 mL/hr over 120 Minutes Intravenous  Once 01/10/22 1933 01/11/22 0022   01/10/22 1130  cefTRIAXone (ROCEPHIN) 2 g in sodium chloride 0.9 % 100 mL IVPB        2 g 200 mL/hr over 30 Minutes Intravenous  Once 01/10/22 1128 01/10/22 1335       Subjective: Seen and examined at bedside and he states he is feeling better.  He states he is not as itchy today.  Denies any lightheadedness or dizziness.  States that his cough is improving.  No other concerns or complaints at this time.  Objective: Vitals:   01/11/22 1800 01/11/22 2141 01/12/22 0419 01/12/22 0630  BP: (!) 156/85 130/83 126/75 132/78  Pulse: 81 95 90 92  Resp:  20 18   Temp:  98.4 F (36.9 C) 98.1 F (36.7 C)   TempSrc:  Oral Oral   SpO2:  98% 98%   Weight:  Height:        Intake/Output Summary (Last 24 hours) at 01/12/2022 1348 Last data filed at 01/12/2022 1148 Gross per 24 hour  Intake 2212.73 ml  Output 3600 ml  Net -1387.27 ml   Filed Weights   01/10/22 0244  Weight: 74.8 kg   Examination: Physical Exam:  Constitutional: WN/WD slightly overweight chronically ill-appearing Caucasian male in no acute distress appears calm comfortable today Respiratory: Diminished to auscultation bilaterally with coarse breath sounds, no wheezing, rales, rhonchi or crackles. Normal respiratory effort and patient is not tachypenic. No accessory muscle use.  Unlabored breathing and not wearing supplemental oxygen via nasal cannula Cardiovascular: RRR, no murmurs / rubs / gallops. S1 and S2 auscultated.  Bilateral feet are wrapped Abdomen: Soft, non-tender, slightly distended secondary to habitus. Bowel  sounds positive.  GU: Deferred. Musculoskeletal: No clubbing / cyanosis of digits/nails. No joint deformity upper and lower extremities.  Skin: No rashes, lesions, ulcers. No induration; Warm and dry.  Neurologic: CN 2-12 grossly intact with no focal deficits. Romberg sign and cerebellar reflexes not assessed.  Psychiatric: Normal judgment and insight. Alert and oriented x 3. Normal mood and appropriate affect.   Data Reviewed: I have personally reviewed following labs and imaging studies  CBC: Recent Labs  Lab 01/10/22 1040 01/11/22 0526 01/12/22 0027  WBC 13.4* 8.7 9.1  NEUTROABS 8.8*  --  3.6  HGB 14.9 12.6* 12.3*  HCT 43.0 37.4* 36.1*  MCV 96.4 97.4 97.8  PLT 320 279 [id number]   Basic Metabolic Panel: Recent Labs  Lab 01/10/22 1040 01/11/22 0526 01/12/22 0027  NA 125* 129* 131*  K 4.9 4.1 4.0  CL 93* 102 101  CO2 24 22 23   GLUCOSE 106* 97 114*  BUN 8 11 9   CREATININE 0.82 0.66 0.69  CALCIUM 8.7* 7.9* 8.4*  MG 1.9  --  1.9  PHOS 3.2  --  3.8   GFR: Estimated Creatinine Clearance: 89.1 mL/min (by C-G formula based on SCr of 0.69 mg/dL). Liver Function Tests: Recent Labs  Lab 01/10/22 1040 01/11/22 0526 01/12/22 0027  AST 26 16 18   ALT 17 12 12   ALKPHOS 70 54 49  BILITOT 1.2 0.6 0.4  PROT 8.5* 6.2* 6.2*  ALBUMIN 4.0 3.0* 2.9*   No results for input(s): "LIPASE", "AMYLASE" in the last 168 hours. No results for input(s): "AMMONIA" in the last 168 hours. Coagulation Profile: No results for input(s): "INR", "PROTIME" in the last 168 hours. Cardiac Enzymes: No results for input(s): "CKTOTAL", "CKMB", "CKMBINDEX", "TROPONINI" in the last 168 hours. BNP (last 3 results) No results for input(s): "PROBNP" in the last 8760 hours. HbA1C: No results for input(s): "HGBA1C" in the last 72 hours. CBG: No results for input(s): "GLUCAP" in the last 168 hours. Lipid Profile: No results for input(s): "CHOL", "HDL", "LDLCALC", "TRIG", "CHOLHDL", "LDLDIRECT" in the last 72  hours. Thyroid Function Tests: No results for input(s): "TSH", "T4TOTAL", "FREET4", "T3FREE", "THYROIDAB" in the last 72 hours. Anemia Panel: Recent Labs    01/12/22 0027  VITAMINB12 170*  FOLATE 9.0  FERRITIN 132  TIBC 231*  IRON 32*  RETICCTPCT 2.1   Sepsis Labs: No results for input(s): "PROCALCITON", "LATICACIDVEN" in the last 168 hours.  Recent Results (from the past 240 hour(s))  MRSA Next Gen by PCR, Nasal     Status: Abnormal   Collection Time: 01/10/22  7:18 PM   Specimen: Nasal Mucosa; Nasal Swab  Result Value Ref Range Status   MRSA by PCR Next Gen DETECTED (  A) NOT DETECTED Final    Comment: CRITICAL RESULT CALLED TO, READ BACK BY AND VERIFIED WITH: KAUR,R AT [facility] ON 01/11/22 BY LUZOLOP (NOTE) The GeneXpert MRSA Assay (FDA approved for NASAL specimens only), is one component of a comprehensive MRSA colonization surveillance program. It is not intended to diagnose MRSA infection nor to guide or monitor treatment for MRSA infections. Test performance is not FDA approved in patients less than 20 years old. Performed at [facility], [place] [address] [place], [place] [address]   Aerobic/Anaerobic Culture w Gram Stain (surgical/deep wound)     Status: None (Preliminary result)   Collection Time: 01/10/22  7:19 PM   Specimen: Foot  Result Value Ref Range Status   Specimen Description   Final    FOOT PUSTULES FROM BILATERAL FEEL Performed at [facility] [address], [place], [place] [address]    Special Requests   Final    NONE Performed at [facility], [place] [address] [place], [place] [address]    Gram Stain   Final    NO WBC SEEN MODERATE GRAM POSITIVE COCCI IN PAIRS FEW GRAM POSITIVE RODS RARE GRAM NEGATIVE RODS Performed at [facility], [place] [address], [place], [place] [address]    Culture   Final    ABUNDANT STREPTOCOCCUS GROUP C Beta hemolytic streptococci are predictably susceptible  to penicillin and other beta lactams. Susceptibility testing not routinely performed. ABUNDANT GRAM NEGATIVE RODS IDENTIFICATION AND SUSCEPTIBILITIES TO FOLLOW ABUNDANT STAPHYLOCOCCUS AUREUS CULTURE REINCUBATED FOR BETTER GROWTH NO ANAEROBES ISOLATED; CULTURE IN PROGRESS FOR 5 DAYS    Report Status PENDING  Incomplete  Resp Panel by RT-PCR (Flu A&B, Covid) Anterior Nasal Swab     Status: None   Collection Time: 01/11/22  4:03 PM   Specimen: Anterior Nasal Swab  Result Value Ref Range Status   SARS Coronavirus 2 by RT PCR NEGATIVE NEGATIVE Final    Comment: (NOTE) SARS-CoV-2 target nucleic acids are NOT DETECTED.  The SARS-CoV-2 RNA is generally detectable in upper respiratory specimens during the acute phase of infection. The lowest concentration of SARS-CoV-2 viral copies this assay can detect is 138 copies/mL. A negative result does not preclude SARS-Cov-2 infection and should not be used as the sole basis for treatment or other patient management decisions. A negative result may occur with  improper specimen collection/handling, submission of specimen other than nasopharyngeal swab, presence of viral mutation(s) within the areas targeted by this assay, and inadequate number of viral copies(<138 copies/mL). A negative result must be combined with clinical observations, patient history, and epidemiological information. The expected result is Negative.  Fact Sheet for Patients:  EntrepreneurPulse.com.au  Fact Sheet for Healthcare Providers:  IncredibleEmployment.be  This test is no t yet approved or cleared by the [place] FDA and  has been authorized for detection and/or diagnosis of SARS-CoV-2 by FDA under an Emergency Use Authorization (EUA). This EUA will remain  in effect (meaning this test can be used) for the duration of the COVID-19 declaration under Section 564(b)(1) of the Act, 21 U.S.C.section 360bbb-3(b)(1), unless the  authorization is terminated  or revoked sooner.       Influenza A by PCR NEGATIVE NEGATIVE Final   Influenza B by PCR NEGATIVE NEGATIVE Final    Comment: (NOTE) The Xpert Xpress SARS-CoV-2/FLU/RSV plus assay is intended as an aid in the diagnosis of influenza from Nasopharyngeal swab specimens and should not be used as a sole basis for treatment. Nasal  washings and aspirates are unacceptable for Xpert Xpress SARS-CoV-2/FLU/RSV testing.  Fact Sheet for Patients: EntrepreneurPulse.com.au  Fact Sheet for Healthcare Providers: IncredibleEmployment.be  This test is not yet approved or cleared by the [place] FDA and has been authorized for detection and/or diagnosis of SARS-CoV-2 by FDA under an Emergency Use Authorization (EUA). This EUA will remain in effect (meaning this test can be used) for the duration of the COVID-19 declaration under Section 564(b)(1) of the Act, 21 U.S.C. section 360bbb-3(b)(1), unless the authorization is terminated or revoked.  Performed at [facility], [place] [address] [place], [place] [address]     Radiology Studies: DG CHEST PORT 1 VIEW  Result Date: 01/11/2022 CLINICAL DATA:  Shortness of breath, cough. EXAM: PORTABLE CHEST 1 VIEW COMPARISON:  September 28, 2020. FINDINGS: The heart size and mediastinal contours are within normal limits. Both lungs are clear. The visualized skeletal structures are unremarkable. IMPRESSION: No active disease. Electronically Signed   By: Marijo Conception M.D.   On: 01/11/2022 16:32    Scheduled Meds:  Chlorhexidine Gluconate Cloth  6 each Topical [id number]   folic acid  1 mg Oral Daily   LORazepam  0-4 mg Oral Q12H   multivitamin with minerals  1 tablet Oral Daily   mupirocin ointment  1 Application Nasal BID   nicotine  21 mg Transdermal Daily   silver sulfADIAZINE   Topical BID   thiamine  100 mg Oral Daily   Or   thiamine  100 mg Intravenous Daily    Continuous Infusions:  sodium chloride 100 mL/hr at 01/11/22 2135   cefTRIAXone (ROCEPHIN)  IV 1 g (01/12/22 1311)   vancomycin 1,000 mg (01/12/22 0630)    LOS: 2 days   Raiford Noble, DO [facility] Available via Epic secure chat 7am-7pm After these hours, please refer to coverage provider listed on [email or web address] 01/12/2022, 1:48 PM

## 2022-01-12 NOTE — Progress Notes (Signed)
   01/12/22 0800  Clinical Encounter Type  Visited With Health care provider  Visit Type Initial  Referral From Nurse  Consult/Referral To Social work;Chaplain  Stress Factors  Patient Stress Factors None identified  Family Stress Factors None identified     Staff RN called Spiritual Care Office for support to patient.  Requested clothes from [employer] for patient - Made formal request to Staff Chaplains.

## 2022-01-12 NOTE — Progress Notes (Signed)
This CSW has attached SA resources to the pt's discharge paperwork.

## 2022-01-13 DIAGNOSIS — F1010 Alcohol abuse, uncomplicated: Secondary | ICD-10-CM | POA: Diagnosis not present

## 2022-01-13 DIAGNOSIS — Z5900 Homelessness unspecified: Secondary | ICD-10-CM | POA: Diagnosis not present

## 2022-01-13 DIAGNOSIS — K2100 Gastro-esophageal reflux disease with esophagitis, without bleeding: Secondary | ICD-10-CM | POA: Diagnosis not present

## 2022-01-13 DIAGNOSIS — L03115 Cellulitis of right lower limb: Secondary | ICD-10-CM | POA: Diagnosis not present

## 2022-01-13 LAB — COMPREHENSIVE METABOLIC PANEL
ALT: 13 U/L (ref 0–44)
AST: 19 U/L (ref 15–41)
Albumin: 2.8 g/dL — ABNORMAL LOW (ref 3.5–5.0)
Alkaline Phosphatase: 49 U/L (ref 38–126)
Anion gap: 7 (ref 5–15)
BUN: 8 mg/dL (ref 8–23)
CO2: 23 mmol/L (ref 22–32)
Calcium: 8.7 mg/dL — ABNORMAL LOW (ref 8.9–10.3)
Chloride: 102 mmol/L (ref 98–111)
Creatinine, Ser: 0.75 mg/dL (ref 0.61–1.24)
GFR, Estimated: >60 mL/min (ref >60)
Glucose, Bld: 96 mg/dL (ref 70–99)
Potassium: 3.9 mmol/L (ref 3.5–5.1)
Sodium: 132 mmol/L — ABNORMAL LOW (ref 135–145)
Total Bilirubin: 0.3 mg/dL (ref 0.3–1.2)
Total Protein: 6.5 g/dL (ref 6.5–8.1)

## 2022-01-13 LAB — CBC WITH DIFFERENTIAL/PLATELET
Abs Immature Granulocytes: 0.05 10*3/uL (ref 0.00–0.07)
Basophils Absolute: 0.1 10*3/uL (ref 0.0–0.1)
Basophils Relative: 1 %
Eosinophils Absolute: 2.9 10*3/uL — ABNORMAL HIGH (ref 0.0–0.5)
Eosinophils Relative: 33 %
HCT: 37.9 % — ABNORMAL LOW (ref 39.0–52.0)
Hemoglobin: 12.8 g/dL — ABNORMAL LOW (ref 13.0–17.0)
Immature Granulocytes: 1 %
Lymphocytes Relative: 19 %
Lymphs Abs: 1.6 10*3/uL (ref 0.7–4.0)
MCH: 33.2 pg (ref 26.0–34.0)
MCHC: 33.8 g/dL (ref 30.0–36.0)
MCV: 98.2 fL (ref 80.0–100.0)
Monocytes Absolute: 0.9 10*3/uL (ref 0.1–1.0)
Monocytes Relative: 10 %
Neutro Abs: 3.2 10*3/uL (ref 1.7–7.7)
Neutrophils Relative %: 36 %
Platelets: 337 10*3/uL (ref 150–400)
RBC: 3.86 MIL/uL — ABNORMAL LOW (ref 4.22–5.81)
RDW: 11.9 % (ref 11.5–15.5)
WBC: 8.7 10*3/uL (ref 4.0–10.5)
nRBC: 0 % (ref 0.0–0.2)

## 2022-01-13 LAB — MAGNESIUM: Magnesium: 1.7 mg/dL (ref 1.7–2.4)

## 2022-01-13 LAB — PHOSPHORUS: Phosphorus: 4.9 mg/dL — ABNORMAL HIGH (ref 2.5–4.6)

## 2022-01-13 MED ORDER — SODIUM CHLORIDE 0.9 % IV SOLN: INTRAVENOUS | Status: AC

## 2022-01-13 MED ORDER — SODIUM CHLORIDE 0.9 % IV SOLN
3.0000 g | Freq: Four times a day (QID) | INTRAVENOUS | Status: DC
  Administered 2022-01-13 – 2022-01-14 (×4): 3 g via INTRAVENOUS
  Filled 2022-01-13 (×5): qty 8

## 2022-01-13 NOTE — Progress Notes (Signed)
Pt requesting a walker and/or wheelchair upon discharge.

## 2022-01-13 NOTE — Progress Notes (Signed)
Chaplain was able to provide a sweater and shirt for [place].  Chaplain did not have any resources for pants or shoes for [place].  Chaplain let nurse and nurse tech know this information.  Chaplain is available to follow-up as needed.   Treavor Blomquist, MDiv Pager [phone]    01/13/22 1000  Clinical Encounter Type  Visited With Health care provider  Visit Type Initial  Referral From Nurse;Chaplain  Consult/Referral To [employer]

## 2022-01-13 NOTE — Progress Notes (Signed)
PROGRESS NOTE    Jimmy BrakemanJack L [name]  WUJ:[id number] DOB: 02/24/1956 DOA: 01/10/2022 PCP: Patient, No Pcp Per   Brief Narrative:  The patient is a 66 year old overweight chronically ill-appearing Caucasian male with past medical history significant for prolonged to alcohol abuse and alcohol withdrawal seizures, bipolar 1 disorder, history of schizophrenia, depression, history of gallstones, GERD, peptic ulcer disease, history of upper GI bleeding, hyponatremia, tobacco abuse disorder, history of polysubstance abuse as well as other comorbidities who came to the emergency department due to bilateral foot pain.  Patient is currently homeless and lives behind the [employer] [place] [address] and has been walking with no shoes on and only socks given the holes in his shoes.  He was noted that his feet were getting red and developed blisters and he denied any fevers, chills, rhinorrhea, sore throat but is having a cough.  He denies chest pain or shortness of breath and denies any abdominal pain.  Started scratching his fingers and states he became itchy.  Because he had this bilateral foot pain he presented to the ED for further evaluation and lab work showed a slight leukocytosis and elevated glucose level.  Bilateral x-rays of her feet were done and showed no soft tissue or fluid or evidence of osteomyelitis.  Given the erythema in his foot and concern podiatry was consulted and there is concern that he may have transferred but podiatry evaluated and recommending dressing changes and IV antibiotics.   Given that the patient started itching in his hands today and looks like typical lesions of scabies so is started on permethrin cream and given his cough a chest x-ray and a COVID respiratory virus panel test is being pursued.  Chest x-ray was negative and COVID testing was negative but respiratory virus panel is pending.     Assessment and Plan: No notes have been filed under this hospital  service. Service: Hospitalist  Cellulitis of both feet with concern for trench foot -Admit to MedSurg/inpatient. -Analgesics as needed and is on Oxycodone 5 mg q 6 hr PRN and Ketorolac 15 mg IVP x1. -Continue supportive care with ondansetron 4 mg p.o./IV every 6 as needed -Continued Ceftriaxone 1 g IVPB daily  and now change to IV Unasyn and will continue and IV vancomycin at 1000 mg every 12 -WBC went from 13.4 -> 8.7 -> 9.1 -> 8.8 -Patient is MRSA positive -Consult wound care and they are recommending topical treatment orders provided for bedside nurses to perform with washing the feet with soap and water and patting dry and applying Silvadene and ABD pads and Kerlix and removing previous sodium by wiping with a moist washcloth before applying more -Podiatry was consulted and they had recommended Betadine wet-to-dry dressing changes daily however after reviewing the chart they feel that the wound care nurse orders are appropriate -They are recommending weightbearing as tolerated bilateral foot -They are recommending keeping the bilateral foot dry and recommending IV antibiotics for erythema for at least 48 hours while hospitalized and discharged on p.o. Doxycycline but after Cx and Senstitivites will change on D/C given wound cx w/ Group C strep, acinetobacter & stap and will likely D/C on Bactrim for acinetobacter & Augmentin or keflex for strep -For now we will continue IV antibiotics and reassess his wound tomorrow   Hyponatremia -Secondary to beer potomania. -Fluid restriction of 1200 MLS per minute mL/day. -Begin normal saline at 100 mL/h and will reduce the rate today to 75 MLS per hour and continue for another  12 more hours -Patient sodium went from 125 -> 129 -> 131 -> 132 Further to monitor trend and repeat CMP in a.m.   Tobacco Use Disorder -Tobacco cessation counseling given -Continue with nicotine patch   Alcohol Abuse -CIWA protocol with Lorazepam. -Magnesium sulfate  supplementation. -Folate, MVI and thiamine. -Consult TOC.   Scabies -Place on Contact Precaution -Started Benadryl 25 mg p.o. daily every 6 as needed for itching -Has Lesions on had where he has itched in the typical location for scabies eggs in the webs of the fingers -Permethrin Cream 5% Topically from Neck Down to the Soles of Feet  -Patient states his itching is improved significantly now however is back again today so may need to redose of the permethrin   Cough with Upper Nasal Congestion, improving  -Check COVID 19, Influenza A/B PCR and this was negative so we will discontinue airborne precautions -Check Respiratory Virus Panel is still pending -CXR not done on Admission but 1 was ordered yesterday and showed no active disease -Abx changing to IV Unasyn  -Will give Flutter Valve and Incentive Spirometry  -Respiratory status is improving   Hypoalbuminemia  -Patient's Albumin Level went from 4.0 -> 3.0 -> 2.9 -> 2.8 -Continue to Monitor and Trend -Repeat CMP in the AM    Normocytic Anemia  -Patient's Hgb/Hct went from 14.9/43.0 -> 12.6/37.4 -> 12.6/36.1 -> 12.3/36.1 -> 12.8/37.9 -Checked anemia panel showed an iron level of 32, U IBC of 199, TIBC of 231, saturation ratios of 14%, ferritin level 132, folate level 9.0, vitamin B12 level 170 -We will start iron supplementation -Continue to Monitor for S/Sx of Bleeding; No overt bleeding noted -Repeat CBC in the AM    GERD (gastroesophageal reflux disease)/GI Prophylaxis  -Also has history of peptic ulcer disease. -Continue PPI with Pantoprazole 40 mg p.o. daily.   Homeless -Consult transitional care team.  States he was off [place] the [employer] [place] and sleeps on a roll out bed -Consider psych evaluation if needed.   DVT prophylaxis: SCDs Start: 01/10/22 1145    Code Status: Full Code Family Communication: No family currently at bedside  Disposition Plan:  Level of care: Med-Surg Status is: Inpatient Remains  inpatient appropriate because: He is slowly improving and likely can be discharged in next 24 to 48 hours with further improvement   Consultants:  Podiatry  Procedures:  As delineated as above  Antimicrobials:  Anti-infectives (From admission, onward)    Start     Dose/Rate Route Frequency Ordered Stop   01/13/22 1700  Ampicillin-Sulbactam (UNASYN) 3 g in sodium chloride 0.9 % 100 mL IVPB        3 g 200 mL/hr over 30 Minutes Intravenous Every 6 hours 01/13/22 1525     01/11/22 1200  cefTRIAXone (ROCEPHIN) 1 g in sodium chloride 0.9 % 100 mL IVPB  Status:  Discontinued        1 g 200 mL/hr over 30 Minutes Intravenous Every 24 hours 01/10/22 1151 01/13/22 1525   01/11/22 0600  vancomycin (VANCOCIN) IVPB 1000 mg/200 mL premix        1,000 mg 200 mL/hr over 60 Minutes Intravenous Every 12 hours 01/10/22 1933     01/10/22 2030  vancomycin (VANCOREADY) IVPB 1500 mg/300 mL        1,500 mg 150 mL/hr over 120 Minutes Intravenous  Once 01/10/22 1933 01/11/22 0022   01/10/22 1130  cefTRIAXone (ROCEPHIN) 2 g in sodium chloride 0.9 % 100 mL IVPB  2 g 200 mL/hr over 30 Minutes Intravenous  Once 01/10/22 1128 01/10/22 1335       Subjective: Seen and examined at bedside and the patient was doing okay but states that he is itching again this morning.  Thinks his feet are doing a little bit better and not hurting as bad.  Redness and erythema is improving and skin is starting to peel some.  Thinks that his sister-in-law can get a hotel and requesting a wheelchair and some DME.  He is slowly improving and likely can be discharged the next 24 to 40 hours: We will de-escalate antibiotics to IV Unasyn in addition to the vancomycin given his culture sensitivities.  He denies any other concerns or complaints at this time  Objective: Vitals:   01/12/22 1401 01/12/22 2111 01/13/22 0525 01/13/22 1158  BP: (!) 141/84 (!) 151/130 (!) 142/79 (!) 147/80  Pulse: 76 83 70 74  Resp: Temp:  98.6 F (37 C) 98.2 F (36.8 C) 97.7 F (36.5 C) 97.8 F (36.6 C)  TempSrc:  Oral Oral Oral  SpO2: 100% 98% 99% 100%  Weight:      Height:        Intake/Output Summary (Last 24 hours) at 01/13/2022 1937 Last data filed at 01/13/2022 1815 Gross per 24 hour  Intake 2980.19 ml  Output 3090 ml  Net -109.81 ml   Filed Weights   01/10/22 0244  Weight: 74.8 kg   Examination: Physical Exam:  Constitutional: WN/WD slightly overweight chronically ill-appearing Caucasian male in no acute distress Respiratory: Diminished to auscultation bilaterally with coarse breath sounds, no wheezing, rales, rhonchi or crackles. Normal respiratory effort and patient is not tachypenic. No accessory muscle use.  Unlabored breathing and not wearing supplemental oxygen nasal cannula Cardiovascular: RRR, no murmurs / rubs / gallops. S1 and S2 auscultated.  Slight lower extremity edema Abdomen: Soft, non-tender, mildly distended second by habitus. Bowel sounds positive.  GU: Deferred. Musculoskeletal: No clubbing / cyanosis of digits/nails. No joint deformity upper and lower extremities. Skin: Bilateral lower extremity feet are peeling and cracked a little bit.  Slightly erythematous and has some sloughing of his feet.  Hands have some scratch marks consistent with scabies Neurologic: CN 2-12 grossly intact with no focal deficits. Romberg sign and cerebellar reflexes not assessed.  Psychiatric: Normal judgment and insight. Alert and oriented x 3. Normal mood and appropriate affect.   Data Reviewed: I have personally reviewed following labs and imaging studies  CBC: Recent Labs  Lab 01/10/22 1040 01/11/22 0526 01/12/22 0027 01/13/22 0440  WBC 13.4* 8.7 9.1 8.7  NEUTROABS 8.8*  --  3.6 3.2  HGB 14.9 12.6* 12.3* 12.8*  HCT 43.0 37.4* 36.1* 37.9*  MCV 96.4 97.4 97.8 98.2  PLT 320 279 295 337   Basic Metabolic Panel: Recent Labs  Lab 01/10/22 1040 01/11/22 0526 01/12/22 0027 01/13/22 0440  NA  125* 129* 131* 132*  K 4.9 4.1 4.0 3.9  CL 93* 102 101 102  CO2 GLUCOSE 106* 97 114* 96  BUN CREATININE 0.82 0.66 0.69 0.75  CALCIUM 8.7* 7.9* 8.4* 8.7*  MG 1.9  --  1.9 1.7  PHOS 3.2  --  3.8 4.9*   GFR: Estimated Creatinine Clearance: 89.1 mL/min (by C-G formula based on SCr of 0.75 mg/dL). Liver Function Tests: Recent Labs  Lab 01/10/22 1040 01/11/22 0526 01/12/22 0027 01/13/22 0440  AST 19  ALT 17 12 12 13   ALKPHOS 70 54 49 49  BILITOT 1.2 0.6 0.4 0.3  PROT 8.5* 6.2* 6.2* 6.5  ALBUMIN 4.0 3.0* 2.9* 2.8*   No results for input(s): "LIPASE", "AMYLASE" in the last 168 hours. No results for input(s): "AMMONIA" in the last 168 hours. Coagulation Profile: No results for input(s): "INR", "PROTIME" in the last 168 hours. Cardiac Enzymes: No results for input(s): "CKTOTAL", "CKMB", "CKMBINDEX", "TROPONINI" in the last 168 hours. BNP (last 3 results) No results for input(s): "PROBNP" in the last 8760 hours. HbA1C: No results for input(s): "HGBA1C" in the last 72 hours. CBG: No results for input(s): "GLUCAP" in the last 168 hours. Lipid Profile: No results for input(s): "CHOL", "HDL", "LDLCALC", "TRIG", "CHOLHDL", "LDLDIRECT" in the last 72 hours. Thyroid Function Tests: No results for input(s): "TSH", "T4TOTAL", "FREET4", "T3FREE", "THYROIDAB" in the last 72 hours. Anemia Panel: Recent Labs    01/12/22 0027  VITAMINB12 170*  FOLATE 9.0  FERRITIN 132  TIBC 231*  IRON 32*  RETICCTPCT 2.1   Sepsis Labs: No results for input(s): "PROCALCITON", "LATICACIDVEN" in the last 168 hours.  Recent Results (from the past 240 hour(s))  MRSA Next Gen by PCR, Nasal     Status: Abnormal   Collection Time: 01/10/22  7:18 PM   Specimen: Nasal Mucosa; Nasal Swab  Result Value Ref Range Status   MRSA by PCR Next Gen DETECTED (A) NOT DETECTED Final    Comment: CRITICAL RESULT CALLED TO, READ BACK BY AND VERIFIED WITH: KAUR,R AT 0005 ON 01/11/22 BY  LUZOLOP (NOTE) The GeneXpert MRSA Assay (FDA approved for NASAL specimens only), is one component of a comprehensive MRSA colonization surveillance program. It is not intended to diagnose MRSA infection nor to guide or monitor treatment for MRSA infections. Test performance is not FDA approved in patients less than 4 years old. Performed at [facility], [address] [place], [place]   Aerobic/Anaerobic Culture w Gram Stain (surgical/deep wound)     Status: None (Preliminary result)   Collection Time: 01/10/22  7:19 PM   Specimen: Foot  Result Value Ref Range Status   Specimen Description   Final    FOOT PUSTULES FROM BILATERAL FEEL Performed at [facility], [address] [place], [place]    Special Requests   Final    NONE Performed at [facility], [address], [place], [place]    Gram Stain   Final    NO WBC SEEN MODERATE GRAM POSITIVE COCCI IN PAIRS FEW GRAM POSITIVE RODS RARE GRAM NEGATIVE RODS Performed at [facility], [address] [place], [place]    Culture   Final    ABUNDANT STREPTOCOCCUS GROUP C Beta hemolytic streptococci are predictably susceptible to penicillin and other beta lactams. Susceptibility testing not routinely performed. ABUNDANT ACINETOBACTER CALCOACETICUS/BAUMANNII COMPLEX ABUNDANT STAPHYLOCOCCUS AUREUS SUSCEPTIBILITIES TO FOLLOW NO ANAEROBES ISOLATED; CULTURE IN PROGRESS FOR 5 DAYS    Report Status PENDING  Incomplete   Organism ID, Bacteria ACINETOBACTER CALCOACETICUS/BAUMANNII COMPLEX  Final      Susceptibility   Acinetobacter calcoaceticus/baumannii complex - MIC*    CEFTAZIDIME 4 SENSITIVE Sensitive     CIPROFLOXACIN <=0.25 SENSITIVE Sensitive     GENTAMICIN <=1 SENSITIVE Sensitive     IMIPENEM <=0.25 SENSITIVE Sensitive     PIP/TAZO <=4 SENSITIVE Sensitive     TRIMETH/SULFA <=20 SENSITIVE Sensitive     AMPICILLIN/SULBACTAM  <=2 SENSITIVE Sensitive     *  ABUNDANT ACINETOBACTER CALCOACETICUS/BAUMANNII COMPLEX  Resp Panel by RT-PCR (Flu A&B, Covid) Anterior Nasal Swab     Status: None   Collection Time: 01/11/22  4:03 PM   Specimen: Anterior Nasal Swab  Result Value Ref Range Status   SARS Coronavirus 2 by RT PCR NEGATIVE NEGATIVE Final    Comment: (NOTE) SARS-CoV-2 target nucleic acids are NOT DETECTED.  The SARS-CoV-2 RNA is generally detectable in upper respiratory specimens during the acute phase of infection. The lowest concentration of SARS-CoV-2 viral copies this assay can detect is 138 copies/mL. A negative result does not preclude SARS-Cov-2 infection and should not be used as the sole basis for treatment or other patient management decisions. A negative result may occur with  improper specimen collection/handling, submission of specimen other than nasopharyngeal swab, presence of viral mutation(s) within the areas targeted by this assay, and inadequate number of viral copies(<138 copies/mL). A negative result must be combined with clinical observations, patient history, and epidemiological information. The expected result is Negative.  Fact Sheet for Patients:  EntrepreneurPulse.com.au  Fact Sheet for Healthcare Providers:  IncredibleEmployment.be  This test is no t yet approved or cleared by the [place] FDA and  has been authorized for detection and/or diagnosis of SARS-CoV-2 by FDA under an Emergency Use Authorization (EUA). This EUA will remain  in effect (meaning this test can be used) for the duration of the COVID-19 declaration under Section 564(b)(1) of the Act, 21 U.S.C.section 360bbb-3(b)(1), unless the authorization is terminated  or revoked sooner.       Influenza A by PCR NEGATIVE NEGATIVE Final   Influenza B by PCR NEGATIVE NEGATIVE Final    Comment: (NOTE) The Xpert Xpress SARS-CoV-2/FLU/RSV plus assay is intended as an aid in the  diagnosis of influenza from Nasopharyngeal swab specimens and should not be used as a sole basis for treatment. Nasal washings and aspirates are unacceptable for Xpert Xpress SARS-CoV-2/FLU/RSV testing.  Fact Sheet for Patients: EntrepreneurPulse.com.au  Fact Sheet for Healthcare Providers: IncredibleEmployment.be  This test is not yet approved or cleared by the [place] FDA and has been authorized for detection and/or diagnosis of SARS-CoV-2 by FDA under an Emergency Use Authorization (EUA). This EUA will remain in effect (meaning this test can be used) for the duration of the COVID-19 declaration under Section 564(b)(1) of the Act, 21 U.S.C. section 360bbb-3(b)(1), unless the authorization is terminated or revoked.  Performed at [facility], [address], [place], [place] [address]   Respiratory (~20 pathogens) panel by PCR     Status: None   Collection Time: 01/11/22  4:03 PM   Specimen: Nasopharyngeal Swab; Respiratory  Result Value Ref Range Status   Adenovirus NOT DETECTED NOT DETECTED Final   Coronavirus 229E NOT DETECTED NOT DETECTED Final    Comment: (NOTE) The Coronavirus on the Respiratory Panel, DOES NOT test for the novel  Coronavirus (2019 nCoV)    Coronavirus HKU1 NOT DETECTED NOT DETECTED Final   Coronavirus NL63 NOT DETECTED NOT DETECTED Final   Coronavirus OC43 NOT DETECTED NOT DETECTED Final   Metapneumovirus NOT DETECTED NOT DETECTED Final   Rhinovirus / Enterovirus NOT DETECTED NOT DETECTED Final   Influenza A NOT DETECTED NOT DETECTED Final   Influenza B NOT DETECTED NOT DETECTED Final   Parainfluenza Virus 1 NOT DETECTED NOT DETECTED Final   Parainfluenza Virus 2 NOT DETECTED NOT DETECTED Final   Parainfluenza Virus 3 NOT DETECTED NOT DETECTED Final   Parainfluenza Virus 4 NOT DETECTED NOT DETECTED  Final   Respiratory Syncytial Virus NOT DETECTED NOT DETECTED Final   Bordetella pertussis  NOT DETECTED NOT DETECTED Final   Bordetella Parapertussis NOT DETECTED NOT DETECTED Final   Chlamydophila pneumoniae NOT DETECTED NOT DETECTED Final   Mycoplasma pneumoniae NOT DETECTED NOT DETECTED Final    Comment: Performed at [facility], [address] [place], [place] [address]    Radiology Studies: No results found.  Scheduled Meds:  Chlorhexidine Gluconate Cloth  6 each Topical Q0600   vitamin B-12  1,000 mcg Oral Daily   folic acid  1 mg Oral Daily   iron polysaccharides  150 mg Oral Daily   LORazepam  0-4 mg Oral Q12H   multivitamin with minerals  1 tablet Oral Daily   mupirocin ointment  1 Application Nasal BID   nicotine  21 mg Transdermal Daily   silver sulfADIAZINE   Topical BID   thiamine  100 mg Oral Daily   Or   thiamine  100 mg Intravenous Daily   Continuous Infusions:  sodium chloride 100 mL/hr at 01/13/22 1203   ampicillin-sulbactam (UNASYN) IV Stopped (01/13/22 1730)   vancomycin 1,000 mg (01/13/22 1757)    LOS: 3 days   Marguerita Merles, DO [facility] Available via Epic secure chat 7am-7pm After these hours, please refer to coverage provider listed on [email or web address] 01/13/2022, 7:37 PM

## 2022-01-14 ENCOUNTER — Other Ambulatory Visit (HOSPITAL_COMMUNITY): Payer: Self-pay

## 2022-01-14 DIAGNOSIS — K219 Gastro-esophageal reflux disease without esophagitis: Secondary | ICD-10-CM

## 2022-01-14 DIAGNOSIS — D649 Anemia, unspecified: Secondary | ICD-10-CM

## 2022-01-14 DIAGNOSIS — R059 Cough, unspecified: Secondary | ICD-10-CM | POA: Diagnosis not present

## 2022-01-14 DIAGNOSIS — E8809 Other disorders of plasma-protein metabolism, not elsewhere classified: Secondary | ICD-10-CM

## 2022-01-14 DIAGNOSIS — L03115 Cellulitis of right lower limb: Secondary | ICD-10-CM | POA: Diagnosis not present

## 2022-01-14 DIAGNOSIS — F1010 Alcohol abuse, uncomplicated: Secondary | ICD-10-CM | POA: Diagnosis not present

## 2022-01-14 DIAGNOSIS — B86 Scabies: Secondary | ICD-10-CM

## 2022-01-14 LAB — COMPREHENSIVE METABOLIC PANEL
ALT: 15 U/L (ref 0–44)
AST: 19 U/L (ref 15–41)
Albumin: 3 g/dL — ABNORMAL LOW (ref 3.5–5.0)
Alkaline Phosphatase: 45 U/L (ref 38–126)
Anion gap: 7 (ref 5–15)
BUN: 10 mg/dL (ref 8–23)
CO2: 24 mmol/L (ref 22–32)
Calcium: 8.8 mg/dL — ABNORMAL LOW (ref 8.9–10.3)
Chloride: 102 mmol/L (ref 98–111)
Creatinine, Ser: 0.59 mg/dL — ABNORMAL LOW (ref 0.61–1.24)
GFR, Estimated: >60 mL/min (ref >60)
Glucose, Bld: 98 mg/dL (ref 70–99)
Potassium: 4 mmol/L (ref 3.5–5.1)
Sodium: 133 mmol/L — ABNORMAL LOW (ref 135–145)
Total Bilirubin: 0.4 mg/dL (ref 0.3–1.2)
Total Protein: 6.7 g/dL (ref 6.5–8.1)

## 2022-01-14 LAB — CBC WITH DIFFERENTIAL/PLATELET
Abs Immature Granulocytes: 0.05 10*3/uL (ref 0.00–0.07)
Basophils Absolute: 0.1 10*3/uL (ref 0.0–0.1)
Basophils Relative: 1 %
Eosinophils Absolute: 2.8 10*3/uL — ABNORMAL HIGH (ref 0.0–0.5)
Eosinophils Relative: 33 %
HCT: 38.6 % — ABNORMAL LOW (ref 39.0–52.0)
Hemoglobin: 12.9 g/dL — ABNORMAL LOW (ref 13.0–17.0)
Immature Granulocytes: 1 %
Lymphocytes Relative: 20 %
Lymphs Abs: 1.7 10*3/uL (ref 0.7–4.0)
MCH: 32.7 pg (ref 26.0–34.0)
MCHC: 33.4 g/dL (ref 30.0–36.0)
MCV: 97.7 fL (ref 80.0–100.0)
Monocytes Absolute: 0.9 10*3/uL (ref 0.1–1.0)
Monocytes Relative: 10 %
Neutro Abs: 3 10*3/uL (ref 1.7–7.7)
Neutrophils Relative %: 35 %
Platelets: 317 10*3/uL (ref 150–400)
RBC: 3.95 MIL/uL — ABNORMAL LOW (ref 4.22–5.81)
RDW: 11.9 % (ref 11.5–15.5)
WBC: 8.5 10*3/uL (ref 4.0–10.5)
nRBC: 0 % (ref 0.0–0.2)

## 2022-01-14 LAB — PATHOLOGIST SMEAR REVIEW

## 2022-01-14 LAB — PHOSPHORUS: Phosphorus: 4.9 mg/dL — ABNORMAL HIGH (ref 2.5–4.6)

## 2022-01-14 LAB — MAGNESIUM: Magnesium: 1.6 mg/dL — ABNORMAL LOW (ref 1.7–2.4)

## 2022-01-14 MED ORDER — SULFAMETHOXAZOLE-TRIMETHOPRIM 800-160 MG PO TABS
1.0000 | ORAL_TABLET | Freq: Two times a day (BID) | ORAL | Status: DC
  Filled 2022-01-14: qty 1

## 2022-01-14 MED ORDER — NICOTINE 21 MG/24HR TD PT24
21.0000 mg | MEDICATED_PATCH | Freq: Every day | TRANSDERMAL | 0 refills | Status: DC
  Filled 2022-01-14: qty 28, 28d supply, fill #0

## 2022-01-14 MED ORDER — AMOXICILLIN-POT CLAVULANATE 875-125 MG PO TABS
1.0000 | ORAL_TABLET | Freq: Two times a day (BID) | ORAL | 0 refills | Status: AC
  Filled 2022-01-14: qty 12, 6d supply, fill #0

## 2022-01-14 MED ORDER — ACETAMINOPHEN 325 MG PO TABS
650.0000 mg | ORAL_TABLET | Freq: Four times a day (QID) | ORAL | 0 refills | Status: DC | PRN
  Filled 2022-01-14: qty 20, 3d supply, fill #0

## 2022-01-14 MED ORDER — SILVER SULFADIAZINE 1 % EX CREA
1.0000 | TOPICAL_CREAM | Freq: Two times a day (BID) | CUTANEOUS | 0 refills | Status: DC
  Filled 2022-01-14: qty 50, 25d supply, fill #0

## 2022-01-14 MED ORDER — AMOXICILLIN-POT CLAVULANATE 875-125 MG PO TABS: 1.0000 | ORAL_TABLET | Freq: Two times a day (BID) | ORAL | Status: DC

## 2022-01-14 MED ORDER — VITAMIN B-1 100 MG PO TABS
100.0000 mg | ORAL_TABLET | Freq: Every day | ORAL | 0 refills | Status: DC
  Filled 2022-01-14: qty 100, 100d supply, fill #0

## 2022-01-14 MED ORDER — ONDANSETRON HCL 4 MG PO TABS
4.0000 mg | ORAL_TABLET | Freq: Four times a day (QID) | ORAL | 0 refills | Status: DC | PRN
  Filled 2022-01-14: qty 20, 5d supply, fill #0

## 2022-01-14 MED ORDER — POLYSACCHARIDE IRON COMPLEX 150 MG PO CAPS
150.0000 mg | ORAL_CAPSULE | Freq: Every day | ORAL | 0 refills | Status: DC
  Filled 2022-01-14: qty 30, 30d supply, fill #0

## 2022-01-14 MED ORDER — ADULT MULTIVITAMIN W/MINERALS CH
1.0000 | ORAL_TABLET | Freq: Every day | ORAL | 0 refills | Status: DC
  Filled 2022-01-14: qty 30, 30d supply, fill #0

## 2022-01-14 MED ORDER — FOLIC ACID 1 MG PO TABS
1.0000 mg | ORAL_TABLET | Freq: Every day | ORAL | 0 refills | Status: DC
  Filled 2022-01-14: qty 30, 30d supply, fill #0

## 2022-01-14 MED ORDER — SULFAMETHOXAZOLE-TRIMETHOPRIM 800-160 MG PO TABS
1.0000 | ORAL_TABLET | Freq: Two times a day (BID) | ORAL | 0 refills | Status: AC
  Filled 2022-01-14: qty 12, 6d supply, fill #0

## 2022-01-14 MED ORDER — MUPIROCIN 2 % EX OINT
1.0000 | TOPICAL_OINTMENT | Freq: Two times a day (BID) | CUTANEOUS | 0 refills | Status: DC
  Filled 2022-01-14: qty 22, 11d supply, fill #0

## 2022-01-14 MED ORDER — MAGNESIUM SULFATE 2 GM/50ML IV SOLN
2.0000 g | Freq: Once | INTRAVENOUS | Status: AC
  Administered 2022-01-14: 2 g via INTRAVENOUS
  Filled 2022-01-14: qty 50

## 2022-01-14 MED ORDER — CYANOCOBALAMIN 1000 MCG PO TABS
1000.0000 ug | ORAL_TABLET | Freq: Every day | ORAL | 0 refills | Status: DC
  Filled 2022-01-14: qty 30, 30d supply, fill #0

## 2022-01-14 MED ORDER — DIPHENHYDRAMINE HCL 25 MG PO CAPS
25.0000 mg | ORAL_CAPSULE | Freq: Four times a day (QID) | ORAL | 0 refills | Status: DC | PRN
  Filled 2022-01-14: qty 30, 8d supply, fill #0

## 2022-01-14 NOTE — TOC Progression Note (Signed)
Transition of Care [facility]) - Progression Note    Patient Details  Name: Jimmy [name] MRN: [id number] Date of Birth: 1955-08-30  Transition of [facility]) CM/SW Contact  Leeroy Cha, RN Phone Number: 01/14/2022, 2:41 PM  Clinical Narrative:    [facility] system updated and patient given letter with instructions.        Expected Discharge Plan and Services                                                 Social Determinants of Health (SDOH) Interventions Food Insecurity Interventions: Inpatient TOC Housing Interventions: Inpatient TOC Transportation Interventions: Inpatient TOC Utilities Interventions: Inpatient TOC  Readmission Risk Interventions   No data to display

## 2022-01-14 NOTE — TOC Progression Note (Signed)
Transition of Care [facility]) - Progression Note    Patient Details  Name: Jimmy [name] MRN: [id number] Date of Birth: 09-Sep-1955  Transition of Care [facility]) CM/SW Contact  Purcell Mouton, RN Phone Number: 01/14/2022, 10:33 AM  Clinical Narrative:      Gilford Rile ordered from [employer].         Expected Discharge Plan and Services                                                 Social Determinants of Health (SDOH) Interventions Food Insecurity Interventions: Inpatient TOC Housing Interventions: Inpatient TOC Transportation Interventions: Inpatient TOC Utilities Interventions: Inpatient TOC  Readmission Risk Interventions     No data to display

## 2022-01-14 NOTE — Discharge Summary (Signed)
Physician Discharge Summary   Patient: Jimmy [name] MRN: [id number] DOB: 03-17-1956  Admit date:     01/10/2022  Discharge date: 01/14/22  Discharge Physician: Raiford Noble, DO   PCP: Patient, No Pcp Per   Recommendations at discharge:   Follow-up and establish with PCP within 1 week and repeat CBC, CMP, mag, Phos within 1 week Follow-up with podiatry within 1 to 2 weeks  Discharge Diagnoses: Principal Problem:   Cellulitis of both feet Active Problems:   Homeless   Hyponatremia   Tobacco use disorder   Alcohol abuse   GERD (gastroesophageal reflux disease)   Hypomagnesemia   Scabies   Cough   Hypoalbuminemia   Normocytic anemia  Resolved Problems:   * No resolved hospital problems. [facility] Course: The patient is a 66 year old overweight chronically ill-appearing Caucasian male with past medical history significant for prolonged to alcohol abuse and alcohol withdrawal seizures, bipolar 1 disorder, history of schizophrenia, depression, history of gallstones, GERD, peptic ulcer disease, history of upper GI bleeding, hyponatremia, tobacco abuse disorder, history of polysubstance abuse as well as other comorbidities who came to the emergency department due to bilateral foot pain.  Patient is currently homeless and lives behind the [place] [address] off of [address] and has been walking with no shoes on and only socks given the holes in his shoes.  He was noted that his feet were getting red and developed blisters and he denied any fevers, chills, rhinorrhea, sore throat but is having a cough.  He denies chest pain or shortness of breath and denies any abdominal pain.  Started scratching his fingers and states he became itchy.  Because he had this bilateral foot pain he presented to the ED for further evaluation and lab work showed a slight leukocytosis and elevated glucose level.  Bilateral x-rays of her feet were done and showed no soft tissue or fluid or evidence of  osteomyelitis.  Given the erythema in his foot and concern podiatry was consulted and there is concern that he may have transferred but podiatry evaluated and recommending dressing changes and IV antibiotics.   Given that the patient started itching in his hands today and looks like typical lesions of scabies so is started on permethrin cream and given his cough a chest x-ray and a COVID respiratory virus panel test is being pursued.  Chest x-ray was negative and COVID testing was negative but respiratory virus panel is pending. His respiratory status improved and so did his feet.  Patient benefited from his IV antibiotics we will change him to p.o. antibiotics for discharge.  Currently he is medically stable for discharge and feet are improving will need to follow-up and establish with PCP and podiatry within 1 to 2 weeks  Assessment and Plan: No notes have been filed under this hospital service. Service: Hospitalist  Cellulitis of both feet with concern for trench foot, improving significantly -Admit to MedSurg/inpatient. -Analgesics as needed and is on Oxycodone 5 mg q 6 hr PRN and Ketorolac 15 mg IVP x1. -Continue supportive care with ondansetron 4 mg p.o./IV every 6 as needed -Continued Ceftriaxone 1 g IVPB daily  and now change to IV Unasyn and will continue and IV vancomycin at 1000 mg every 12 -WBC went from 13.4 -> 8.7 -> 9.1 -> 8.7 and is now 8.5 -Patient is MRSA positive -Consult wound care and they are recommending topical treatment orders provided for bedside nurses to perform with washing the feet with soap and water and  patting dry and applying Silvadene and ABD pads and Kerlix and removing previous sodium by wiping with a moist washcloth before applying more -Podiatry was consulted and they had recommended Betadine wet-to-dry dressing changes daily however after reviewing the chart they feel that the wound care nurse orders are appropriate -They are recommending weightbearing as  tolerated bilateral foot -They are recommending keeping the bilateral foot dry and recommending IV antibiotics for erythema for at least 48 hours while hospitalized and discharged on p.o. Doxycycline but after Cx and Senstitivites will change on D/C given wound cx w/ Group C strep, acinetobacter & stap and will likely D/C on Bactrim for acinetobacter & Augmentin or keflex for strep -For now we will continue IV antibiotics and after reassessing his wound his wound is improving significantly so he is medically stable for discharge and can follow-up with establish with PCP within 1 to 2 weeks and continue p.o. antibiotics with Augmentin and Bactrim DS complete 10-day course   Hyponatremia -Secondary to beer potomania. -Fluid restriction of 1200 MLS per minute mL/day. -Begin normal saline at 100 mL/h and will reduce the rate today to 75 MLS per hour and continue for another 12 more hours -Patient sodium went from 125 -> 129 -> 131 -> 132 and is now improved to 133 -Continue to monitor and trend and repeat CMP within 1 week   Tobacco Use Disorder -Tobacco cessation counseling given -Continue with nicotine patch  Hypomagnesemia -Patient mag level was 1.6 -Replete with IV mag sulfate 2 g prior to discharge -Continue to monitor replete as necessary and repeat mag level within 1 week   Alcohol Abuse -CIWA protocol with Lorazepam. -Magnesium sulfate supplementation. -Folate, MVI and thiamine. -Consult TOC.   Scabies -Place on Contact Precaution -Started Benadryl 25 mg p.o. daily every 6 as needed for itching -Has Lesions on had where he has itched in the typical location for scabies eggs in the webs of the fingers -Permethrin Cream 5% Topically from Neck Down to the Soles of Feet  -Patient states his itching is improved significantly now however is back again today so may need to redose of the permethrin; patient was sent the permethrin on 2 and his itching is improved   Cough with Upper Nasal  Congestion, improving  -Check COVID 19, Influenza A/B PCR and this was negative so we will discontinue airborne precautions -Check Respiratory Virus Panel is still pending -CXR not done on Admission but 1 was ordered yesterday and showed no active disease -Abx changing to IV Unasyn while hospitalized and then will change to p.o. Bactrim DS and Augmentin for discharge for total 10 days -Will give Flutter Valve and Incentive Spirometry  -Respiratory status is improving and cough is improved   Hypoalbuminemia  -Patient's Albumin Level went from 4.0 -> 3.0 -> 2.9 -> 2.8 and is now 3.0 -Continue to Monitor and Trend -Repeat CMP within 1 week   Normocytic Anemia  -Patient's Hgb/Hct went from 14.9/43.0 -> 12.6/37.4 -> 12.6/36.1 -> 12.3/36.1 -> 12.8/37.9 and is now 12.9/38.6 at time of discharge -Checked anemia panel showed an iron level of 32, U IBC of 199, TIBC of 231, saturation ratios of 14%, ferritin level 132, folate level 9.0, vitamin B12 level 170 -We will start iron supplementation -Continue to Monitor for S/Sx of Bleeding; No overt bleeding noted -Repeat CBC with 1 week   GERD (gastroesophageal reflux disease)/GI Prophylaxis  -Also has history of peptic ulcer disease. -Continue PPI with Pantoprazole 40 mg p.o. daily.   Homeless -Consult  transitional care team.  States he was off behind the [employer] [place] and sleeps on a roll out bed -Consider psych evaluation if needed. -TOC consulted and he may have provide the patient a walker as he does want to go back to his rolling bed and he will go to a motel with his sister-in-law in the next 2 days.  PCP appointment is being set up    Active Pressure Injury/Wound(s)     Pressure Ulcer  Duration          Pressure Injury 09/20/20 Ankle Posterior;Right Deep Tissue Pressure Injury - Purple or maroon localized area of discolored intact skin or blood-filled blister due to damage of underlying soft tissue from pressure and/or shear. 481  days   Pressure Injury 09/20/20 Hip Right Stage 2 -  Partial thickness loss of dermis presenting as a shallow open injury with a red, pink wound bed without slough. 481 days   Pressure Injury 06/22/21 Hip Right;Lateral;Mid Stage 2 -  Partial thickness loss of dermis presenting as a shallow open injury with a red, pink wound bed without slough. 206 days            Consultants: Podiatry Procedures performed: None Disposition: Home Diet recommendation:  Discharge Diet Orders (From admission, onward)     Start     Ordered   01/14/22 0000  Diet - low sodium heart healthy        01/14/22 1511           Regular diet DISCHARGE MEDICATION: Allergies as of 01/14/2022       Reactions   Neosporin [neomycin-bacitracin Zn-polymyx] Rash        Medication List     STOP taking these medications    terbinafine 1 % cream Commonly known as: LAMISIL       TAKE these medications    acetaminophen 325 MG tablet Commonly known as: TYLENOL Take 2 tablets (650 mg total) by mouth every 6 (six) hours as needed for mild pain (or Fever >/= 101).   amoxicillin-clavulanate 875-125 MG tablet Commonly known as: AUGMENTIN Take 1 tablet by mouth every 12 (twelve) hours for 6 days.   calcium carbonate 500 MG chewable tablet Commonly known as: TUMS - dosed in mg elemental calcium Chew 2 tablets by mouth as needed for indigestion or heartburn.   cyanocobalamin 1000 MCG tablet Take 1 tablet (1,000 mcg total) by mouth daily. Start taking on: January 15, 2022   diphenhydrAMINE 25 mg capsule Commonly known as: BENADRYL Take 1 capsule (25 mg total) by mouth every 6 (six) hours as needed for itching.   folic acid 1 MG tablet Commonly known as: FOLVITE Take 1 tablet (1 mg total) by mouth daily. Start taking on: January 15, 2022   iron polysaccharides 150 MG capsule Commonly known as: NIFEREX Take 1 capsule (150 mg total) by mouth daily. Start taking on: January 15, 2022   multivitamin  with minerals Tabs tablet Take 1 tablet by mouth daily. Start taking on: January 15, 2022   mupirocin ointment 2 % Commonly known as: BACTROBAN Place 1 Application into the nose 2 (two) times daily.   nicotine 21 mg/24hr patch Commonly known as: NICODERM CQ - dosed in mg/24 hours Place 1 patch (21 mg total) onto the skin daily. Start taking on: January 15, 2022   ondansetron 4 MG tablet Commonly known as: ZOFRAN Take 1 tablet (4 mg total) by mouth every 6 (six) hours as needed for nausea.   Silvadene  1 % cream Generic drug: silver sulfADIAZINE Apply to affected area topically 2 (two) times daily.   sulfamethoxazole-trimethoprim 800-160 MG tablet Commonly known as: BACTRIM DS Take 1 tablet by mouth every 12 (twelve) hours for 6 days.   thiamine 100 MG tablet Commonly known as: Vitamin B-1 Take 1 tablet (100 mg total) by mouth daily. Start taking on: January 15, 2022               Discharge Care Instructions  (From admission, onward)           Start     Ordered   01/14/22 0000  Discharge wound care:       Comments: Wound care  2 times daily      Comments: Wash feet with soap and water, then pat dry and apply Silvadene and ABD pads, then kerlex BID. Remove previous Silvadene each time by wiping with moist washcloth before applying more.   01/14/22 1511            Follow-up Information     [facility] [employer] Follow up.   Why: Walker fis from this company. Contact information: [address] [place] [address] [phone]         [facility] Follow up.   Specialty: Internal Medicine Why: Your appointment is Nov 3 at 8:30 AM. Please keep this appointment. Contact information: [address] [place] [address] [phone]               Discharge Exam: Ceasar Mons Weights   01/10/22 0244  Weight: 74.8 kg   Vitals:   01/14/22 0518 01/14/22 1126  BP: (!) 163/82 134/72  Pulse: 74 72  Resp: 18 18   Temp: 98.4 F (36.9 C) 98.3 F (36.8 C)  SpO2: 98%    Examination: Physical Exam:  Constitutional: Slightly overweight chronically ill-appearing Caucasian male in no acute distress appears calm Respiratory: Diminished to auscultation bilaterally, no wheezing, rales, rhonchi or crackles. Normal respiratory effort and patient is not tachypenic. No accessory muscle use.  Unlabored breathing Cardiovascular: RRR, no murmurs / rubs / gallops. S1 and S2 auscultated.  Mild lower extremity edema Abdomen: Soft, non-tender, slightly distended secondary body habitus.  Bowel sounds positive.  GU: Deferred. Musculoskeletal/skin: Bilateral lower feet are peeling and cracked a little bit and slightly erythematous but this is improving and is much improved since the time he came in with less swelling Neurologic: CN 2-12 grossly intact with no focal deficits. Romberg sign and cerebellar reflexes not assessed.  Psychiatric: Normal judgment and insight. Alert and oriented x 3. Normal mood and appropriate affect.   Condition at discharge: stable  The results of significant diagnostics from this hospitalization (including imaging, microbiology, ancillary and laboratory) are listed below for reference.   Imaging Studies: DG CHEST PORT 1 VIEW  Result Date: 01/11/2022 CLINICAL DATA:  Shortness of breath, cough. EXAM: PORTABLE CHEST 1 VIEW COMPARISON:  September 28, 2020. FINDINGS: The heart size and mediastinal contours are within normal limits. Both lungs are clear. The visualized skeletal structures are unremarkable. IMPRESSION: No active disease. Electronically Signed   By: Lupita Raider M.D.   On: 01/11/2022 16:32   DG Foot Complete Left  Result Date: 01/10/2022 CLINICAL DATA:  Bilateral foot pain and swelling. Oozing fairly confluent bilateral feet. EXAM: LEFT FOOT - COMPLETE 3+ VIEW COMPARISON:  Left foot radiographs 06/21/2021 FINDINGS: Redemonstration of severe great toe metatarsophalangeal joint space  narrowing with bone-on-bone contact, subchondral sclerosis  and cystic change, lateral articular surface erosions, and large dorsal and lateral degenerative osteophytes. Ossicle measuring approximately 11 mm dorsal to the joint space on lateral view, similar to prior. Mild joint space narrowing of the interphalangeal joints diffusely. Mild degenerative spurring at the lateral base of the first metatarsal at the articulation with the base of the second metatarsal. Small plantar calcaneal heel spur. Minimal chronic enthesopathic change at the Achilles insertion on the calcaneus. No definite soft tissue ulcer is seen. No cortical erosion. IMPRESSION: 1. Severe great toe metatarsophalangeal joint osteoarthritis, similar to prior. 2. No definite soft tissue ulcer is seen. No cortical erosion to suggest osteomyelitis. Electronically Signed   By: Neita Garnetonald  Viola M.D.   On: 01/10/2022 10:22   DG Foot Complete Right  Result Date: 01/10/2022 CLINICAL DATA:  Bilateral foot pain and swelling. Oozing clear liquid from bilateral feet. EXAM: RIGHT FOOT COMPLETE - 3+ VIEW COMPARISON:  Right tibia and fibula radiographs 04/27/2012 FINDINGS: Severe great toe metatarsophalangeal joint space narrowing, subchondral sclerosis, and peripheral osteophytosis. Chronic well corticated ossicle measuring up to approximately 11 mm at the dorsal aspect of the joint with large osteophyte extending dorsally and posteriorly from the metatarsal head, seen on lateral view. Mild joint space narrowing of the interphalangeal joints diffusely. Tiny plantar and posterior calcaneal heel spurs. No acute fracture or dislocation. No cortical erosion. No subcutaneous air. IMPRESSION: 1. Severe great toe metatarsophalangeal joint osteoarthritis. 2. No cortical erosion to indicate radiographic evidence of osteomyelitis. 3. Tiny plantar and posterior calcaneal heel spurs. Electronically Signed   By: Neita Garnetonald  Viola M.D.   On: 01/10/2022 10:20     Microbiology: Results for orders placed or performed during the hospital encounter of 01/10/22  MRSA Next Gen by PCR, Nasal     Status: Abnormal   Collection Time: 01/10/22  7:18 PM   Specimen: Nasal Mucosa; Nasal Swab  Result Value Ref Range Status   MRSA by PCR Next Gen DETECTED (A) NOT DETECTED Final    Comment: CRITICAL RESULT CALLED TO, READ BACK BY AND VERIFIED WITH: KAUR,R AT 0005 ON 01/11/22 BY LUZOLOP (NOTE) The GeneXpert MRSA Assay (FDA approved for NASAL specimens only), is one component of a comprehensive MRSA colonization surveillance program. It is not intended to diagnose MRSA infection nor to guide or monitor treatment for MRSA infections. Test performance is not FDA approved in patients less than 10472 years old. Performed at [facility], [address] [place], [place] [address]   Aerobic/Anaerobic Culture w Gram Stain (surgical/deep wound)     Status: None (Preliminary result)   Collection Time: 01/10/22  7:19 PM   Specimen: Foot  Result Value Ref Range Status   Specimen Description   Final    FOOT PUSTULES FROM BILATERAL FEEL Performed at [facility], [address] [place], [place] [address]    Special Requests   Final    NONE Performed at [facility], [address] [place], [place] [address]    Gram Stain   Final    NO WBC SEEN MODERATE GRAM POSITIVE COCCI IN PAIRS FEW GRAM POSITIVE RODS RARE GRAM NEGATIVE RODS Performed at [facility], [address] [place], [place] [address]    Culture   Final    ABUNDANT STREPTOCOCCUS GROUP C Beta hemolytic streptococci are predictably susceptible to penicillin and other beta lactams. Susceptibility testing not routinely performed. ABUNDANT ACINETOBACTER CALCOACETICUS/BAUMANNII COMPLEX ABUNDANT STAPHYLOCOCCUS AUREUS NO ANAEROBES ISOLATED; CULTURE IN PROGRESS FOR 5 DAYS  Report Status PENDING  Incomplete   Organism ID, Bacteria  ACINETOBACTER CALCOACETICUS/BAUMANNII COMPLEX  Final   Organism ID, Bacteria STAPHYLOCOCCUS AUREUS  Final      Susceptibility   Acinetobacter calcoaceticus/baumannii complex - MIC*    CEFTAZIDIME 4 SENSITIVE Sensitive     CIPROFLOXACIN <=0.25 SENSITIVE Sensitive     GENTAMICIN <=1 SENSITIVE Sensitive     IMIPENEM <=0.25 SENSITIVE Sensitive     PIP/TAZO <=4 SENSITIVE Sensitive     TRIMETH/SULFA <=20 SENSITIVE Sensitive     AMPICILLIN/SULBACTAM <=2 SENSITIVE Sensitive     * ABUNDANT ACINETOBACTER CALCOACETICUS/BAUMANNII COMPLEX   Staphylococcus aureus - MIC*    CIPROFLOXACIN <=0.5 SENSITIVE Sensitive     ERYTHROMYCIN <=0.25 SENSITIVE Sensitive     GENTAMICIN <=0.5 SENSITIVE Sensitive     OXACILLIN 0.5 SENSITIVE Sensitive     TETRACYCLINE <=1 SENSITIVE Sensitive     VANCOMYCIN 1 SENSITIVE Sensitive     TRIMETH/SULFA <=10 SENSITIVE Sensitive     CLINDAMYCIN <=0.25 SENSITIVE Sensitive     RIFAMPIN <=0.5 SENSITIVE Sensitive     Inducible Clindamycin NEGATIVE Sensitive     * ABUNDANT STAPHYLOCOCCUS AUREUS  Resp Panel by RT-PCR (Flu A&B, Covid) Anterior Nasal Swab     Status: None   Collection Time: 01/11/22  4:03 PM   Specimen: Anterior Nasal Swab  Result Value Ref Range Status   SARS Coronavirus 2 by RT PCR NEGATIVE NEGATIVE Final    Comment: (NOTE) SARS-CoV-2 target nucleic acids are NOT DETECTED.  The SARS-CoV-2 RNA is generally detectable in upper respiratory specimens during the acute phase of infection. The lowest concentration of SARS-CoV-2 viral copies this assay can detect is 138 copies/mL. A negative result does not preclude SARS-Cov-2 infection and should not be used as the sole basis for treatment or other patient management decisions. A negative result may occur with  improper specimen collection/handling, submission of specimen other than nasopharyngeal swab, presence of viral mutation(s) within the areas targeted by this assay, and inadequate number of  viral copies(<138 copies/mL). A negative result must be combined with clinical observations, patient history, and epidemiological information. The expected result is Negative.  Fact Sheet for Patients:  BloggerCourse.com  Fact Sheet for Healthcare Providers:  SeriousBroker.it  This test is no t yet approved or cleared by the [place] FDA and  has been authorized for detection and/or diagnosis of SARS-CoV-2 by FDA under an Emergency Use Authorization (EUA). This EUA will remain  in effect (meaning this test can be used) for the duration of the COVID-19 declaration under Section 564(b)(1) of the Act, 21 U.S.C.section 360bbb-3(b)(1), unless the authorization is terminated  or revoked sooner.       Influenza A by PCR NEGATIVE NEGATIVE Final   Influenza B by PCR NEGATIVE NEGATIVE Final    Comment: (NOTE) The Xpert Xpress SARS-CoV-2/FLU/RSV plus assay is intended as an aid in the diagnosis of influenza from Nasopharyngeal swab specimens and should not be used as a sole basis for treatment. Nasal washings and aspirates are unacceptable for Xpert Xpress SARS-CoV-2/FLU/RSV testing.  Fact Sheet for Patients: BloggerCourse.com  Fact Sheet for Healthcare Providers: SeriousBroker.it  This test is not yet approved or cleared by the [place] FDA and has been authorized for detection and/or diagnosis of SARS-CoV-2 by FDA under an Emergency Use Authorization (EUA). This EUA will remain in effect (meaning this test can be used) for the duration of the COVID-19 declaration under Section 564(b)(1) of the Act, 21 U.S.C. section 360bbb-3(b)(1), unless the authorization is terminated  or revoked.  Performed at [facility], [address], [place], [place] [address]   Respiratory (~20 pathogens) panel by PCR     Status: None   Collection Time: 01/11/22  4:03 PM    Specimen: Nasopharyngeal Swab; Respiratory  Result Value Ref Range Status   Adenovirus NOT DETECTED NOT DETECTED Final   Coronavirus 229E NOT DETECTED NOT DETECTED Final    Comment: (NOTE) The Coronavirus on the Respiratory Panel, DOES NOT test for the novel  Coronavirus (2019 nCoV)    Coronavirus HKU1 NOT DETECTED NOT DETECTED Final   Coronavirus NL63 NOT DETECTED NOT DETECTED Final   Coronavirus OC43 NOT DETECTED NOT DETECTED Final   Metapneumovirus NOT DETECTED NOT DETECTED Final   Rhinovirus / Enterovirus NOT DETECTED NOT DETECTED Final   Influenza A NOT DETECTED NOT DETECTED Final   Influenza B NOT DETECTED NOT DETECTED Final   Parainfluenza Virus 1 NOT DETECTED NOT DETECTED Final   Parainfluenza Virus 2 NOT DETECTED NOT DETECTED Final   Parainfluenza Virus 3 NOT DETECTED NOT DETECTED Final   Parainfluenza Virus 4 NOT DETECTED NOT DETECTED Final   Respiratory Syncytial Virus NOT DETECTED NOT DETECTED Final   Bordetella pertussis NOT DETECTED NOT DETECTED Final   Bordetella Parapertussis NOT DETECTED NOT DETECTED Final   Chlamydophila pneumoniae NOT DETECTED NOT DETECTED Final   Mycoplasma pneumoniae NOT DETECTED NOT DETECTED Final    Comment: Performed at [facility], [address] [place], [place] [address]    Labs: CBC: Recent Labs  Lab 01/10/22 1040 01/11/22 0526 01/12/22 0027 01/13/22 0440 01/14/22 0426  WBC 13.4* 8.7 9.1 8.7 8.5  NEUTROABS 8.8*  --  3.6 3.2 3.0  HGB 14.9 12.6* 12.3* 12.8* 12.9*  HCT 43.0 37.4* 36.1* 37.9* 38.6*  MCV 96.4 97.4 97.8 98.2 97.7  PLT 320 279 295 337 317   Basic Metabolic Panel: Recent Labs  Lab 01/10/22 1040 01/11/22 0526 01/12/22 0027 01/13/22 0440 01/14/22 0426  NA 125* 129* 131* 132* 133*  K 4.9 4.1 4.0 3.9 4.0  CL 93* 102 101 102 102  CO2 24 22 23 23 24   GLUCOSE 106* 97 114* 96 98  BUN 8 11 9 8 10   CREATININE 0.82 0.66 0.69 0.75 0.59*  CALCIUM 8.7* 7.9* 8.4* 8.7* 8.8*  MG 1.9  --  1.9 1.7 1.6*  PHOS 3.2   --  3.8 4.9* 4.9*   Liver Function Tests: Recent Labs  Lab 01/10/22 1040 01/11/22 0526 01/12/22 0027 01/13/22 0440 01/14/22 0426  AST 26 16 18 19 19   ALT 17 12 12 13 15   ALKPHOS 70 54 49 49 45  BILITOT 1.2 0.6 0.4 0.3 0.4  PROT 8.5* 6.2* 6.2* 6.5 6.7  ALBUMIN 4.0 3.0* 2.9* 2.8* 3.0*   CBG: No results for input(s): "GLUCAP" in the last 168 hours.  Discharge time spent: greater than 30 minutes.  Signed: 01/15/22, DO [facility] 01/14/2022

## 2022-01-14 NOTE — TOC Progression Note (Signed)
Transition of Care [facility]) - Progression Note    Patient Details  Name: Jimmy [name] MRN: [id number] Date of Birth: 1956/03/24  Transition of Care [facility]) CM/SW Contact  Purcell Mouton, RN Phone Number: 01/14/2022, 2:56 PM  Clinical Narrative:     Spoke with pt concerning follow up appointment, medications and explained [facility] program. Pt continued to talk over CM and would not listen. Pt will need to keep this appointment to assist him with his medical care and medications. Pt do not have medication coverage. Explained to pt that he will need to keep his walker for he will not be able to receive another for for 5 years. Appointment at [facility] on Nov 3 at 81 AM. Pt was encouraged to keep this appointment.         Expected Discharge Plan and Services                                                 Social Determinants of Health (SDOH) Interventions Food Insecurity Interventions: Inpatient TOC Housing Interventions: Inpatient TOC Transportation Interventions: Inpatient TOC Utilities Interventions: Inpatient TOC  Readmission Risk Interventions     No data to display

## 2022-01-14 NOTE — Plan of Care (Signed)
  Problem: Clinical Measurements: Goal: Ability to avoid or minimize complications of infection will improve Outcome: Adequate for Discharge   Problem: Skin Integrity: Goal: Skin integrity will improve Outcome: Adequate for Discharge   Problem: Education: Goal: Knowledge of General Education information will improve Description: Including pain rating scale, medication(s)/side effects and non-pharmacologic comfort measures Outcome: Adequate for Discharge   Problem: Health Behavior/Discharge Planning: Goal: Ability to manage health-related needs will improve Outcome: Adequate for Discharge   Problem: Clinical Measurements: Goal: Ability to maintain clinical measurements within normal limits will improve Outcome: Adequate for Discharge Goal: Will remain free from infection Outcome: Adequate for Discharge Goal: Diagnostic test results will improve Outcome: Adequate for Discharge Goal: Respiratory complications will improve Outcome: Adequate for Discharge Goal: Cardiovascular complication will be avoided Outcome: Adequate for Discharge   Problem: Activity: Goal: Risk for activity intolerance will decrease Outcome: Adequate for Discharge   Problem: Nutrition: Goal: Adequate nutrition will be maintained Outcome: Adequate for Discharge   Problem: Coping: Goal: Level of anxiety will decrease Outcome: Adequate for Discharge   Problem: Elimination: Goal: Will not experience complications related to bowel motility Outcome: Adequate for Discharge Goal: Will not experience complications related to urinary retention Outcome: Adequate for Discharge   Problem: Pain Managment: Goal: General experience of comfort will improve Outcome: Adequate for Discharge   Problem: Safety: Goal: Ability to remain free from injury will improve Outcome: Adequate for Discharge   Problem: Skin Integrity: Goal: Risk for impaired skin integrity will decrease Outcome: Adequate for Discharge   

## 2022-01-14 NOTE — Progress Notes (Signed)
Patient was discharged with the understanding that he will not be eligible for another walker for 5 years. Patient was given specific instructions on his upcoming appointment and the need to apply for medicare part D. Patient was given all of his medications with the understanding that the [facility] program will not be an option for another year. Patient demonstrated a teach back as to how to care for the wounds on his feet. Patient was given a bus pass and was taken to the exit closest to the bus station via [place].

## 2022-01-15 LAB — AEROBIC/ANAEROBIC CULTURE W GRAM STAIN (SURGICAL/DEEP WOUND): Gram Stain: NONE SEEN

## 2022-01-24 ENCOUNTER — Inpatient Hospital Stay: Payer: Self-pay | Admitting: Nurse Practitioner

## 2022-04-12 ENCOUNTER — Other Ambulatory Visit: Payer: Self-pay

## 2022-04-12 ENCOUNTER — Encounter (HOSPITAL_COMMUNITY): Payer: Self-pay

## 2022-04-12 ENCOUNTER — Emergency Department (HOSPITAL_COMMUNITY)
Admission: EM | Admit: 2022-04-12 | Discharge: 2022-04-12 | Disposition: A | Discharge status: Home / Self Care | Payer: Medicare Other | Attending: Emergency Medicine | Admitting: Emergency Medicine

## 2022-04-12 DIAGNOSIS — M79672 Pain in left foot: Secondary | ICD-10-CM

## 2022-04-12 DIAGNOSIS — M79671 Pain in right foot: Secondary | ICD-10-CM | POA: Diagnosis present

## 2022-04-12 MED ORDER — ACETAMINOPHEN 325 MG PO TABS: 650.0000 mg | ORAL_TABLET | Freq: Four times a day (QID) | ORAL | 0 refills | Status: DC | PRN

## 2022-04-12 MED ORDER — ACETAMINOPHEN 500 MG PO TABS
1000.0000 mg | ORAL_TABLET | Freq: Once | ORAL | Status: AC
  Administered 2022-04-12: 1000 mg via ORAL
  Filled 2022-04-12: qty 2

## 2022-04-12 MED ORDER — IBUPROFEN 200 MG PO TABS
400.0000 mg | ORAL_TABLET | Freq: Once | ORAL | Status: AC
  Administered 2022-04-12: 400 mg via ORAL
  Filled 2022-04-12: qty 2

## 2022-04-12 NOTE — Discharge Instructions (Addendum)
You were seen for foot pain.  Your wounds are continuing to improve.  No evidence of cellulitis or infection.  Your vital signs are stable.  Please continue to do your daily dressing changes and follow-up with a the community [facility] wellness clinic.  Their phone number has been attached for you to call and schedule an appointment.

## 2022-04-12 NOTE — ED Provider Notes (Signed)
[facility] Provider Note   CSN: [id number] Arrival date & time: 04/12/22  2129     History No chief complaint on file.   HPI Jimmy [name] is a 67 y.o. male presenting for recurrent foot pain.  Admitted back in October for bilateral lower extremity cellulitis.  Podiatry came and evaluated agreed with wound care and inpatient management.  Since his discharge symptoms have grossly improved.  He states he was worried it was so cold tonight he was not can to be able to take care of his feet and his feet were hurting too bad.Marland Kitchen He denies fevers or chills, nausea vomiting, severe shortness of breath..   Patient's recorded medical, surgical, social, medication list and allergies were reviewed in the Snapshot window as part of the initial history.   Review of Systems   Review of Systems  Constitutional:  Negative for chills and fever.  HENT:  Negative for ear pain and sore throat.   Eyes:  Negative for pain and visual disturbance.  Respiratory:  Negative for cough and shortness of breath.   Cardiovascular:  Negative for chest pain and palpitations.  Gastrointestinal:  Negative for abdominal pain and vomiting.  Genitourinary:  Negative for dysuria and hematuria.  Musculoskeletal:  Negative for arthralgias and back pain.  Skin:  Positive for wound. Negative for color change and rash.  Neurological:  Negative for seizures and syncope.  All other systems reviewed and are negative.   Physical Exam Updated Vital Signs There were no vitals taken for this visit. Physical Exam Vitals and nursing note reviewed.  Constitutional:      General: He is not in acute distress.    Appearance: He is well-developed.  HENT:     Head: Normocephalic and atraumatic.  Eyes:     Conjunctiva/sclera: Conjunctivae normal.  Cardiovascular:     Rate and Rhythm: Normal rate and regular rhythm.     Heart sounds: No murmur heard. Pulmonary:     Effort:  Pulmonary effort is normal. No respiratory distress.     Breath sounds: Normal breath sounds.  Abdominal:     Palpations: Abdomen is soft.     Tenderness: There is no abdominal tenderness.  Musculoskeletal:        General: Deformity and signs of injury (See picture.) present. No swelling.     Cervical back: Neck supple.  Skin:    General: Skin is warm and dry.     Capillary Refill: Capillary refill takes less than 2 seconds.  Neurological:     Mental Status: He is alert.  Psychiatric:        Mood and Affect: Mood normal.           ED Course/ Medical Decision Making/ A&P    Procedures Procedures   Medications Ordered in ED Medications - No data to display  Medical Decision Making:    Jimmy [name] is a 67 y.o. male who presented to the ED today with bilateral foot pain detailed above.     Handoff received from EMS.  Additional history discussed with patient's family/caregivers.  Patient placed on continuous vitals and telemetry monitoring while in ED which was reviewed periodically.   Complete initial physical exam performed, notably the patient  was hemodynamically stable in no acute distress bilateral feet with pulses.  See images.  Grossly improved from prior.   Reviewed and confirmed nursing documentation for past medical history, family history, social history.    Assessment and  Plan:   Ultimately, wounds have grossly improved in the outpatient setting. He unfortunately has very poor prognosis due to his difficulty to maintain his own supportive care.  He has run out of all of his bandage supplies since approximately December and he continues to be homeless.  I recommended evaluation for systemic illness with blood work, I do not believe that this is likely occurring.  I discussed this with the patient.  He stated that he did not want blood work.  Stated the only reason is here tonight is because it was cold outside.  He stated he was worried about his feet  "falling off in the cold".  I informed the patient that without blood work we cannot adequately evaluate him for more sinister infection and he expressed understanding.  He stated that he just wanted food and a place to be warm.  Informed patient that he would not be being admitted tonight as the emergency room department does not serve as a location for this single complaint and that there are multiple local shelters which could more adequately serve the patient.  Patient expressed understanding.  Referred to local shelters, instructed to follow-up with outpatient care providers.  Ultimately no evidence of acute emergency today.  No evidence of arterial ischemia, acute on infection, ulceration.  Will provide wound care, p.o. pain control with over-the-counter medications and strict return precautions as well as referral to cost based local primary office. No acute indication for further inpatient care and management.   Disposition:  I have considered need for hospitalization, however, considering all of the above, I believe this patient is stable for discharge at this time.  Patient/family educated about specific return precautions for given chief complaint and symptoms.  Patient/family educated about follow-up with PCP.     Patient/family expressed understanding of return precautions and need for follow-up. Patient spoken to regarding all imaging and laboratory results and appropriate follow up for these results. All education provided in verbal form with additional information in written form. Time was allowed for answering of patient questions. Patient discharged.    Emergency Department Medication Summary:   Medications - No data to display       Clinical Impression: No diagnosis found.   Data Unavailable   Final Clinical Impression(s) / ED Diagnoses Final diagnoses:  None    Rx / DC Orders ED Discharge Orders     None         Tretha Sciara, MD 04/12/22 2303

## 2022-04-12 NOTE — ED Notes (Signed)
Pt refused blood work. Gave sandwiches and drinks

## 2022-04-12 NOTE — ED Triage Notes (Signed)
Pt has been having foot pain for 1 month. Pt was seen before for this but ran out of bandages for feet.

## 2022-05-16 ENCOUNTER — Encounter (HOSPITAL_COMMUNITY): Payer: Self-pay

## 2022-05-16 ENCOUNTER — Emergency Department (HOSPITAL_COMMUNITY): Payer: Medicare Other

## 2022-05-16 ENCOUNTER — Other Ambulatory Visit: Payer: Self-pay

## 2022-05-16 ENCOUNTER — Emergency Department (HOSPITAL_COMMUNITY)
Admission: EM | Admit: 2022-05-16 | Discharge: 2022-05-17 | Disposition: A | Discharge status: Home / Self Care | Payer: Medicare Other | Attending: Emergency Medicine | Admitting: Emergency Medicine

## 2022-05-16 DIAGNOSIS — Z5900 Homelessness unspecified: Secondary | ICD-10-CM | POA: Diagnosis not present

## 2022-05-16 DIAGNOSIS — L539 Erythematous condition, unspecified: Secondary | ICD-10-CM | POA: Insufficient documentation

## 2022-05-16 DIAGNOSIS — M79672 Pain in left foot: Secondary | ICD-10-CM | POA: Diagnosis not present

## 2022-05-16 MED ORDER — CEPHALEXIN 500 MG PO CAPS
500.0000 mg | ORAL_CAPSULE | Freq: Once | ORAL | Status: AC
  Administered 2022-05-17: 500 mg via ORAL
  Filled 2022-05-16: qty 1

## 2022-05-16 MED ORDER — CEPHALEXIN 500 MG PO CAPS: 500.0000 mg | ORAL_CAPSULE | Freq: Four times a day (QID) | ORAL | 0 refills | Status: AC

## 2022-05-16 NOTE — ED Triage Notes (Signed)
BIB EMS from [facility] for left foot pain/swelling. EMS reports pt walking on scene. Pt states this has been going on for a week.

## 2022-05-16 NOTE — ED Provider Notes (Signed)
[facility] Provider Note   CSN: [id number] Arrival date & time: 05/16/22  2206     History  Chief Complaint  Patient presents with   Leg Swelling   Foot Pain    ILIYAN [name] is a 67 y.o. male. With past medical history of homelessness, alcohol use disorder, polysubstance abuse, bipolar, schizophrenia who presents to the emergency department with leg pain.  States that over the past week he has had increased pain to his left foot. States that is has been particularly worse today. He states that it hurts to walk and is throbbing. He denies falls or trauma to the foot. He is homeless and states that he has been walking around in wet socks and shoes. He denies having fever or chills. He denies numbness or tingling to the foot. Denies swelling to his leg or foot.     Foot Pain       Home Medications Prior to Admission medications   Medication Sig Start Date End Date Taking? Authorizing Provider  cephALEXin (KEFLEX) 500 MG capsule Take 1 capsule (500 mg total) by mouth 4 (four) times daily for 5 days. 05/16/22 05/21/22 Yes Mickie Hillier, PA-C  acetaminophen (TYLENOL) 325 MG tablet Take 2 tablets (650 mg total) by mouth every 6 (six) hours as needed for mild pain (or Fever >/= 101). 01/14/22   Raiford Noble Latif, DO  acetaminophen (TYLENOL) 325 MG tablet Take 2 tablets (650 mg total) by mouth every 6 (six) hours as needed for up to 60 doses. 04/12/22   Tretha Sciara, MD  calcium carbonate (TUMS - DOSED IN MG ELEMENTAL CALCIUM) 500 MG chewable tablet Chew 2 tablets by mouth as needed for indigestion or heartburn.    [provider]  cyanocobalamin 1000 MCG tablet Take 1 tablet (1,000 mcg total) by mouth daily. 01/15/22   Raiford Noble Latif, DO  diphenhydrAMINE (BENADRYL) 25 mg capsule Take 1 capsule (25 mg total) by mouth every 6 (six) hours as needed for itching. 01/14/22   Raiford Noble Latif, DO  folic acid (FOLVITE) 1 MG  tablet Take 1 tablet (1 mg total) by mouth daily. 01/15/22   Raiford Noble Latif, DO  iron polysaccharides (NIFEREX) 150 MG capsule Take 1 capsule (150 mg total) by mouth daily. 01/15/22   Raiford Noble Latif, DO  Multiple Vitamin (MULTIVITAMIN WITH MINERALS) TABS tablet Take 1 tablet by mouth daily. 01/15/22   Raiford Noble Latif, DO  mupirocin ointment (BACTROBAN) 2 % Place 1 Application into the nose 2 (two) times daily. 01/14/22   Sheikh, Omair Latif, DO  nicotine (NICODERM CQ - DOSED IN MG/24 HOURS) 21 mg/24hr patch Place 1 patch (21 mg total) onto the skin daily. 01/15/22   Sheikh, Omair Latif, DO  ondansetron (ZOFRAN) 4 MG tablet Take 1 tablet (4 mg total) by mouth every 6 (six) hours as needed for nausea. 01/14/22   Raiford Noble Latif, DO  silver sulfADIAZINE (SILVADENE) 1 % cream Apply to affected area topically 2 (two) times daily. 01/14/22   Raiford Noble Latif, DO  thiamine (VITAMIN B-1) 100 MG tablet Take 1 tablet (100 mg total) by mouth daily. 01/15/22   Raiford Noble Latif, DO  famotidine (PEPCID) 20 MG tablet Take 1 tablet (20 mg total) by mouth 2 (two) times daily. 10/13/18 10/19/18  Charlesetta Shanks, MD      Allergies    Neosporin [neomycin-bacitracin zn-polymyx]    Review of Systems   Review of Systems  Musculoskeletal:  Positive for arthralgias and gait problem.  All other systems reviewed and are negative.   Physical Exam Updated Vital Signs BP 139/79 (BP Location: Right Arm)   Pulse 81   Temp (!) 97.5 F (36.4 C) (Oral)   Resp 18   Ht '5\' 8"'$  (1.727 m)   Wt 79.8 kg   SpO2 100%   BMI 26.76 kg/m  Physical Exam Vitals and nursing note reviewed.  Constitutional:      General: He is not in acute distress.    Appearance: He is not ill-appearing or toxic-appearing.     Comments: Mildly disheveled appearing   HENT:     Head: Normocephalic.     Mouth/Throat:     Mouth: Mucous membranes are moist.  Eyes:     General: No scleral icterus.    Extraocular Movements:  Extraocular movements intact.  Cardiovascular:     Rate and Rhythm: Normal rate and regular rhythm.     Pulses: Normal pulses.  Pulmonary:     Effort: Pulmonary effort is normal. No respiratory distress.  Musculoskeletal:        General: Tenderness present. No swelling or deformity. Normal range of motion.     Right lower leg: No edema.     Left lower leg: No edema.  Skin:    General: Skin is warm and dry.     Capillary Refill: Capillary refill takes less than 2 seconds.     Findings: Erythema present.  Neurological:     General: No focal deficit present.     Mental Status: He is alert and oriented to person, place, and time. Mental status is at baseline.  Psychiatric:        Mood and Affect: Mood normal.        Behavior: Behavior normal.        Thought Content: Thought content normal.        Judgment: Judgment normal.       ED Results / Procedures / Treatments   Labs (all labs ordered are listed, but only abnormal results are displayed) Labs Reviewed - No data to display  EKG None  Radiology DG Foot Complete Left  Result Date: 05/16/2022 CLINICAL DATA:  Left foot pain and swelling. EXAM: LEFT FOOT - COMPLETE 3+ VIEW COMPARISON:  Radiograph 01/10/2022. MRI 06/22/2021 FINDINGS: No acute fracture or dislocation. 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 Complete joint space loss with subchondral cystic change and osteophytes. Small plantar calcaneal spur. No erosive change or bony destruction. Mild soft tissue thickening medial to the first metatarsal phalangeal joint, chronic. No soft tissue gas or radiopaque foreign body. IMPRESSION: 1. No acute fracture or dislocation. 2. 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 3. Mild medial soft tissue edema. Electronically Signed   By: Keith Rake M.D.   On: 05/16/2022 23:46    Procedures Procedures   Medications Ordered in ED Medications  cephALEXin (KEFLEX) capsule  500 mg (500 mg Oral Given 05/17/22 0003)    ED Course/ Medical Decision Making/ A&P  Medical Decision Making Amount and/or Complexity of Data Reviewed Radiology: ordered.  Risk Prescription drug management.  Initial Impression and Ddx 68 year old male who presents with left foot pain.  Patient PMH that increases complexity of ED encounter:  GERD, substance abuse, homelessness, bipolar, cellulitis of both feet, etc. Differential: fracture, subluxation, sprain, gout or pseudogout, septic joint, cellulitis, DVT, etc.   Interpretation of Diagnostics I independent reviewed and interpreted the labs as followed: n/a  -  I independently visualized the following imaging with scope of interpretation limited to determining acute life threatening conditions related to emergency care: plain film left foot, which revealed mild medial soft tissue swelling  Patient Reassessment and Ultimate Disposition/Management 67 year old male who presents to the emergency department with left foot pain. He is nonseptic and nontoxic appearing.  He does appear to be mildly disheveled on dressed in old clothing. I evaluated both of his feet.  He has been walking around in wet socks and shoes given that he is unhoused and it has been raining.  The left foot does appear to be erythematous on the plantar surface of the foot.  It is almost boggy feeling, consistent with being wet for a long period of time.  There is no focal fluctuance or induration.  He does have this wound that is pictured above to the lateral aspect of the plantar surface of the foot.  Pulses are intact.  Compartments are soft.  He has full range of motion of his joints.  Does not appear to have a septic joint.  His symptoms are inconsistent with gout or pseudogout.  Symptoms are also inconsistent with DVT. Plain film does not demonstrate a fracture or subluxation.  Symptoms are also not consistent with a sprain.  I have reviewed previous admissions  and imaging of the left foot which showed previous severe cellulitis with desquamation of the bottom of the foot.  This appears to be improved from that time.  However given his previous history of cellulitis and worsening pain, erythema that is demarcated to the plantar surface of the foot as seen on the second image that I added to his physical exam will start him on Keflex.  He was given first dose here in the emergency department. Do not feel that he needs admission at this time. He was also given a fresh dry pair of socks and food while he was here. Will send prescription for the remainder of his Keflex.  He is agreeable to this plan.  He is given return precautions.  The patient has been appropriately medically screened and/or stabilized in the ED. I have low suspicion for any other emergent medical condition which would require further screening, evaluation or treatment in the ED or require inpatient management. At time of discharge the patient is hemodynamically stable and in no acute distress. I have discussed work-up results and diagnosis with patient and answered all questions. Patient is agreeable with discharge plan. We discussed strict return precautions for returning to the emergency department and they verbalized understanding.     Patient management required discussion with the following services or consulting groups:  None  Complexity of Problems Addressed Chronic illness with exacerbation  Additional Data Reviewed and Analyzed Further history obtained from: Past medical history and medications listed in the EMR, Prior ED visit notes, Recent discharge summary, Care Everywhere, and Prior labs/imaging results  Patient Encounter Risk Assessment Prescriptions, SDOH impact on management, and Consideration of hospitalization  Final Clinical Impression(s) / ED Diagnoses Final diagnoses:  Foot pain, left    Rx / DC Orders ED Discharge Orders          Ordered    cephALEXin  (KEFLEX) 500 MG capsule  4 times daily        05/16/22 2355              Mickie Hillier, PA-C 05/18/22 [place], [place], [place] 05/19/22 [phone]

## 2022-05-16 NOTE — Discharge Instructions (Addendum)
You were seen here in the emergency department for foot pain. I am placing you on antibiotics for possible skin infection of the bottom of your left foot. Please take these until completion. Please try to keep your feet as dry as you can given the circumstances. We have provided you with a dry pair of socks to change into as this will help keep from skin breakdown and infection. Please return if your foot pain is worsening or you are having more wounds to the feet.

## 2022-05-17 NOTE — ED Notes (Signed)
Pt given 2 pairs of clean dry socks and a sandwich and sprite

## 2022-06-25 ENCOUNTER — Emergency Department (HOSPITAL_COMMUNITY)
Admission: EM | Admit: 2022-06-25 | Discharge: 2022-06-25 | Discharge status: Against Medical Advice | Payer: Medicare PPO | Attending: Emergency Medicine | Admitting: Emergency Medicine

## 2022-06-25 ENCOUNTER — Emergency Department (HOSPITAL_COMMUNITY): Payer: Medicare PPO

## 2022-06-25 ENCOUNTER — Encounter (HOSPITAL_COMMUNITY): Payer: Self-pay

## 2022-06-25 DIAGNOSIS — M79672 Pain in left foot: Secondary | ICD-10-CM | POA: Diagnosis not present

## 2022-06-25 DIAGNOSIS — F17200 Nicotine dependence, unspecified, uncomplicated: Secondary | ICD-10-CM | POA: Diagnosis not present

## 2022-06-25 DIAGNOSIS — H5789 Other specified disorders of eye and adnexa: Secondary | ICD-10-CM | POA: Diagnosis not present

## 2022-06-25 DIAGNOSIS — R220 Localized swelling, mass and lump, head: Secondary | ICD-10-CM

## 2022-06-25 DIAGNOSIS — M79671 Pain in right foot: Secondary | ICD-10-CM | POA: Diagnosis present

## 2022-06-25 DIAGNOSIS — Z4800 Encounter for change or removal of nonsurgical wound dressing: Secondary | ICD-10-CM | POA: Insufficient documentation

## 2022-06-25 DIAGNOSIS — T69029A Immersion foot, unspecified foot, initial encounter: Secondary | ICD-10-CM

## 2022-06-25 MED ORDER — ACETAMINOPHEN 325 MG PO TABS: 650.0000 mg | ORAL_TABLET | Freq: Once | ORAL | Status: DC

## 2022-06-25 MED ORDER — DEXAMETHASONE SODIUM PHOSPHATE 10 MG/ML IJ SOLN: 10.0000 mg | Freq: Once | INTRAMUSCULAR | Status: DC

## 2022-06-25 MED ORDER — DIPHENHYDRAMINE HCL 25 MG PO CAPS: 25.0000 mg | ORAL_CAPSULE | Freq: Once | ORAL | Status: DC

## 2022-06-25 NOTE — ED Provider Notes (Signed)
[facility] Provider Note   CSN: [id number] Arrival date & time: 06/25/22  1056     History  Chief Complaint  Patient presents with   Wound Check   Eye Pain    Jimmy [name] is a 67 y.o. male with PMH significant for severe alcohol use disorder, tobacco use disorder, polysubstance abuse, homelessness who presents with concern for pain to bilateral feet.  Patient has been walking around outside and wet shoes, socks.  He reports significant itching, pain.  Patient also reports that he was sleeping on the ground and laying on his right side, woke up with swelling to his eye today.  He denies any difficulty with his vision, extraocular movements, or drainage from the eye.   Wound Check  Eye Pain       Home Medications Prior to Admission medications   Medication Sig Start Date End Date Taking? Authorizing Provider  acetaminophen (TYLENOL) 325 MG tablet Take 2 tablets (650 mg total) by mouth every 6 (six) hours as needed for mild pain (or Fever >/= 101). 01/14/22   Raiford Noble Latif, DO  acetaminophen (TYLENOL) 325 MG tablet Take 2 tablets (650 mg total) by mouth every 6 (six) hours as needed for up to 60 doses. 04/12/22   Tretha Sciara, MD  calcium carbonate (TUMS - DOSED IN MG ELEMENTAL CALCIUM) 500 MG chewable tablet Chew 2 tablets by mouth as needed for indigestion or heartburn.    [provider]  cyanocobalamin 1000 MCG tablet Take 1 tablet (1,000 mcg total) by mouth daily. 01/15/22   Raiford Noble Latif, DO  diphenhydrAMINE (BENADRYL) 25 mg capsule Take 1 capsule (25 mg total) by mouth every 6 (six) hours as needed for itching. 01/14/22   Raiford Noble Latif, DO  folic acid (FOLVITE) 1 MG tablet Take 1 tablet (1 mg total) by mouth daily. 01/15/22   Raiford Noble Latif, DO  iron polysaccharides (NIFEREX) 150 MG capsule Take 1 capsule (150 mg total) by mouth daily. 01/15/22   Raiford Noble Latif, DO  Multiple Vitamin  (MULTIVITAMIN WITH MINERALS) TABS tablet Take 1 tablet by mouth daily. 01/15/22   Raiford Noble Latif, DO  mupirocin ointment (BACTROBAN) 2 % Place 1 Application into the nose 2 (two) times daily. 01/14/22   Sheikh, Omair Latif, DO  nicotine (NICODERM CQ - DOSED IN MG/24 HOURS) 21 mg/24hr patch Place 1 patch (21 mg total) onto the skin daily. 01/15/22   Sheikh, Omair Latif, DO  ondansetron (ZOFRAN) 4 MG tablet Take 1 tablet (4 mg total) by mouth every 6 (six) hours as needed for nausea. 01/14/22   Raiford Noble Latif, DO  silver sulfADIAZINE (SILVADENE) 1 % cream Apply to affected area topically 2 (two) times daily. 01/14/22   Raiford Noble Latif, DO  thiamine (VITAMIN B-1) 100 MG tablet Take 1 tablet (100 mg total) by mouth daily. 01/15/22   Raiford Noble Latif, DO  famotidine (PEPCID) 20 MG tablet Take 1 tablet (20 mg total) by mouth 2 (two) times daily. 10/13/18 10/19/18  Charlesetta Shanks, MD      Allergies    Neosporin [neomycin-bacitracin zn-polymyx]    Review of Systems   Review of Systems  Eyes:  Positive for pain.  All other systems reviewed and are negative.   Physical Exam Updated Vital Signs BP (!) 144/82 (BP Location: Left Arm)   Pulse 99   Temp 98 F (36.7 C) (Oral)   Resp 16   SpO2 100%  Physical  Exam Vitals and nursing note reviewed.  Constitutional:      General: He is not in acute distress.    Comments: Disheveled, foul-smelling  HENT:     Head: Normocephalic and atraumatic.     Comments: Patient with some soft tissue edema around right eye, normal appearance of sclera, normal extraocular movements, no proptosis. Eyes:     General:        Right eye: No discharge.        Left eye: No discharge.  Cardiovascular:     Rate and Rhythm: Normal rate and regular rhythm.     Heart sounds: No murmur heard.    No friction rub. No gallop.  Pulmonary:     Effort: Pulmonary effort is normal.     Breath sounds: Normal breath sounds.  Abdominal:     General: Bowel sounds  are normal.     Palpations: Abdomen is soft.  Skin:    General: Skin is warm and dry.     Capillary Refill: Capillary refill takes less than 2 seconds.     Comments: Bilateral feet with macerated tissue, some scattered macules, papules, appearance of feet that have spent a long time in wet shoes and socks.  No open sores or ulcers noted, DP, PT pulses 2+ bilaterally.  Neurological:     Mental Status: He is alert and oriented to person, place, and time.  Psychiatric:        Mood and Affect: Mood normal.        Behavior: Behavior normal.     ED Results / Procedures / Treatments   Labs (all labs ordered are listed, but only abnormal results are displayed) Labs Reviewed - No data to display  EKG None  Radiology No results found.  Procedures Procedures    Medications Ordered in ED Medications  dexamethasone (DECADRON) injection 10 mg (has no administration in time range)  diphenhydrAMINE (BENADRYL) capsule 25 mg (has no administration in time range)  acetaminophen (TYLENOL) tablet 650 mg (has no administration in time range)    ED Course/ Medical Decision Making/ A&P                             Medical Decision Making Amount and/or Complexity of Data Reviewed Radiology: ordered.  Risk OTC drugs. Prescription drug management.   This patient is a 67 y.o. male  who presents to the ED for concern of 2 concerns, patient with right eye swelling from sleeping on the ground last night, as well as some bilateral foot pain, patient has been walking around in wet shoes, socks for days..   Differential diagnoses prior to evaluation: The emergent differential diagnosis includes, but is not limited to, trench foot, new foot wound, ulcers, acute fracture, dislocation, for eye swelling considered.  peri orbital cellulitis, orbital cellulitis, soft tissue swelling secondary to lying on wet ground, allergic reaction vs other. This is not an exhaustive differential.   Past Medical  History / Co-morbidities: Homelessness, polysubstance abuse, tobacco abuse  Additional history: Chart reviewed. Pertinent results include: reviewed labwork, imaging from previous ED visits  Physical Exam: Physical exam performed. The pertinent findings include: soft tissue swelling of right eye with no proptosis, pain with eom or evidence of developing infection on my exam  Clinically I think patient's symptoms are overall most consistent with some soft tissue swelling of the eye that may be secondary to allergic reaction, I have low clinical suspicion for preseptal  or orbital cellulitis based on my exam, no proptosis, no pain with extraocular movements.  No evidence of cellulitis changes around the eye.  His feet seem consistent with some pain, maceration from trench foot, having prolonged exposure to wet feet, however patient went outside to smoke a cigarette and left prior to finishing his clinical evaluation, x-rays of feet, or oral administration of Benadryl, Decadron for eye swelling. Final Clinical Impression(s) / ED Diagnoses Final diagnoses:  None    Rx / DC Orders ED Discharge Orders     None         Dorien Chihuahua 06/25/22 1331    Regan Lemming, MD 06/25/22 2112

## 2022-06-25 NOTE — ED Triage Notes (Signed)
Pt presents with c/o wound check on his feet. Pt reports his feet wake him up itching. Pt also reports pain in his right eye from laying on the ground yesterday. Right eye is swollen.

## 2022-06-26 ENCOUNTER — Emergency Department (HOSPITAL_COMMUNITY)
Admission: EM | Admit: 2022-06-26 | Discharge: 2022-06-26 | Disposition: A | Discharge status: Home / Self Care | Payer: Medicare PPO | Attending: Emergency Medicine | Admitting: Emergency Medicine

## 2022-06-26 ENCOUNTER — Other Ambulatory Visit: Payer: Self-pay

## 2022-06-26 DIAGNOSIS — Z5900 Homelessness unspecified: Secondary | ICD-10-CM | POA: Insufficient documentation

## 2022-06-26 DIAGNOSIS — M79672 Pain in left foot: Secondary | ICD-10-CM | POA: Diagnosis not present

## 2022-06-26 DIAGNOSIS — H05223 Edema of bilateral orbit: Secondary | ICD-10-CM | POA: Insufficient documentation

## 2022-06-26 DIAGNOSIS — E871 Hypo-osmolality and hyponatremia: Secondary | ICD-10-CM | POA: Diagnosis not present

## 2022-06-26 DIAGNOSIS — M79671 Pain in right foot: Secondary | ICD-10-CM

## 2022-06-26 LAB — CBC WITH DIFFERENTIAL/PLATELET
Abs Immature Granulocytes: 0.04 10*3/uL (ref 0.00–0.07)
Basophils Absolute: 0.1 10*3/uL (ref 0.0–0.1)
Basophils Relative: 1 %
Eosinophils Absolute: 2 10*3/uL — ABNORMAL HIGH (ref 0.0–0.5)
Eosinophils Relative: 19 %
HCT: 39.1 % (ref 39.0–52.0)
Hemoglobin: 13.7 g/dL (ref 13.0–17.0)
Immature Granulocytes: 0 %
Lymphocytes Relative: 17 %
Lymphs Abs: 1.8 10*3/uL (ref 0.7–4.0)
MCH: 32.7 pg (ref 26.0–34.0)
MCHC: 35 g/dL (ref 30.0–36.0)
MCV: 93.3 fL (ref 80.0–100.0)
Monocytes Absolute: 0.9 10*3/uL (ref 0.1–1.0)
Monocytes Relative: 8 %
Neutro Abs: 5.7 10*3/uL (ref 1.7–7.7)
Neutrophils Relative %: 55 %
Platelets: 319 10*3/uL (ref 150–400)
RBC: 4.19 MIL/uL — ABNORMAL LOW (ref 4.22–5.81)
RDW: 12.1 % (ref 11.5–15.5)
WBC: 10.5 10*3/uL (ref 4.0–10.5)
nRBC: 0 % (ref 0.0–0.2)

## 2022-06-26 LAB — BASIC METABOLIC PANEL
Anion gap: 11 (ref 5–15)
BUN: 9 mg/dL (ref 8–23)
CO2: 21 mmol/L — ABNORMAL LOW (ref 22–32)
Calcium: 9 mg/dL (ref 8.9–10.3)
Chloride: 94 mmol/L — ABNORMAL LOW (ref 98–111)
Creatinine, Ser: 0.67 mg/dL (ref 0.61–1.24)
GFR, Estimated: >60 mL/min (ref >60)
Glucose, Bld: 100 mg/dL — ABNORMAL HIGH (ref 70–99)
Potassium: 4 mmol/L (ref 3.5–5.1)
Sodium: 126 mmol/L — ABNORMAL LOW (ref 135–145)

## 2022-06-26 MED ORDER — SODIUM CHLORIDE 1 G PO TABS
2.0000 g | ORAL_TABLET | Freq: Once | ORAL | Status: AC
  Administered 2022-06-26: 2 g via ORAL
  Filled 2022-06-26: qty 2

## 2022-06-26 MED ORDER — OXYCODONE-ACETAMINOPHEN 5-325 MG PO TABS
2.0000 | ORAL_TABLET | Freq: Once | ORAL | Status: AC
  Administered 2022-06-26: 2 via ORAL
  Filled 2022-06-26: qty 2

## 2022-06-26 NOTE — Discharge Instructions (Signed)
As we discussed, your foot pain is likely caused from changing your socks frequently as a get wet.  If you are not walking around I would urge you to be take off your socks and shoes to allow your feet to breathe.  I also provided some resources for shelters for you as well.

## 2022-06-26 NOTE — ED Provider Notes (Signed)
Jimmy [name]   CSN: [id number] Arrival date & time: 06/26/22  2028     History Chief Complaint  Patient presents with   Foot Pain    Jimmy [name] is a 67 y.o. male patient with history of homelessness and frequent ED visits for foot pain who presents to the emergency department today for further evaluation of foot pain and bilateral eye swelling.  Patient was seen evaluated in the emergency department last night but left before workup was done because he did not want to wait.  Patient returns today.  Patient states that he does not change his socks frequently and there was quite a bit of rain yesterday and he was walking around with wet shoes.  States feet are very painful.  He denies any fevers or chills.  No chest pain or shortness of breath.   Foot Pain       Home Medications Prior to Admission medications   Medication Sig Start Date End Date Taking? Authorizing Provider  acetaminophen (TYLENOL) 325 MG tablet Take 2 tablets (650 mg total) by mouth every 6 (six) hours as needed for mild pain (or Fever >/= 101). 01/14/22   Raiford Noble Latif, DO  acetaminophen (TYLENOL) 325 MG tablet Take 2 tablets (650 mg total) by mouth every 6 (six) hours as needed for up to 60 doses. 04/12/22   Tretha Sciara, MD  calcium carbonate (TUMS - DOSED IN MG ELEMENTAL CALCIUM) 500 MG chewable tablet Chew 2 tablets by mouth as needed for indigestion or heartburn.    [provider]  cyanocobalamin 1000 MCG tablet Take 1 tablet (1,000 mcg total) by mouth daily. 01/15/22   Raiford Noble Latif, DO  diphenhydrAMINE (BENADRYL) 25 mg capsule Take 1 capsule (25 mg total) by mouth every 6 (six) hours as needed for itching. 01/14/22   Raiford Noble Latif, DO  folic acid (FOLVITE) 1 MG tablet Take 1 tablet (1 mg total) by mouth daily. 01/15/22   Raiford Noble Latif, DO  iron polysaccharides (NIFEREX) 150 MG capsule Take 1 capsule (150 mg  total) by mouth daily. 01/15/22   Raiford Noble Latif, DO  Multiple Vitamin (MULTIVITAMIN WITH MINERALS) TABS tablet Take 1 tablet by mouth daily. 01/15/22   Raiford Noble Latif, DO  mupirocin ointment (BACTROBAN) 2 % Place 1 Application into the nose 2 (two) times daily. 01/14/22   Sheikh, Omair Latif, DO  nicotine (NICODERM CQ - DOSED IN MG/24 HOURS) 21 mg/24hr patch Place 1 patch (21 mg total) onto the skin daily. 01/15/22   Sheikh, Omair Latif, DO  ondansetron (ZOFRAN) 4 MG tablet Take 1 tablet (4 mg total) by mouth every 6 (six) hours as needed for nausea. 01/14/22   Raiford Noble Latif, DO  silver sulfADIAZINE (SILVADENE) 1 % cream Apply to affected area topically 2 (two) times daily. 01/14/22   Raiford Noble Latif, DO  thiamine (VITAMIN B-1) 100 MG tablet Take 1 tablet (100 mg total) by mouth daily. 01/15/22   Raiford Noble Latif, DO  famotidine (PEPCID) 20 MG tablet Take 1 tablet (20 mg total) by mouth 2 (two) times daily. 10/13/18 10/19/18  Charlesetta Shanks, MD      Allergies    Neosporin [neomycin-bacitracin zn-polymyx]    Review of Systems   Review of Systems  All other systems reviewed and are negative.   Physical Exam Updated Vital Signs BP (!) 165/84   Pulse (!) 104   Temp 99 F (37.2 C) (Oral)  Resp 17   SpO2 98%  Physical Exam Vitals and nursing [name] reviewed.  Constitutional:      Appearance: Normal appearance.  HENT:     Head: Normocephalic and atraumatic.  Eyes:     General:        Right eye: No discharge.        Left eye: No discharge.     Conjunctiva/sclera: Conjunctivae normal.  Cardiovascular:     Pulses:          Dorsalis pedis pulses are 2+ on the right side and 2+ on the left side.  Pulmonary:     Effort: Pulmonary effort is normal.  Skin:    General: Skin is warm and dry.     Findings: No rash.  Neurological:     General: No focal deficit present.     Mental Status: He is alert.  Psychiatric:        Mood and Affect: Mood normal.         Behavior: Behavior normal.             ED Results / Procedures / Treatments   Labs (all labs ordered are listed, but only abnormal results are displayed) Labs Reviewed  CBC WITH DIFFERENTIAL/PLATELET - Abnormal; Notable for the following components:      Result Value   RBC 4.19 (*)    Eosinophils Absolute 2.0 (*)    All other components within normal limits  BASIC METABOLIC PANEL - Abnormal; Notable for the following components:   Sodium 126 (*)    Chloride 94 (*)    CO2 21 (*)    Glucose, Bld 100 (*)    All other components within normal limits    EKG None  Radiology No results found.  Procedures Procedures    Medications Ordered in ED Medications  sodium chloride tablet 2 g (has no administration in time range)  oxyCODONE-acetaminophen (PERCOCET/ROXICET) 5-325 MG per tablet 2 tablet (2 tablets Oral Given 06/26/22 2133)    ED Course/ Medical Decision Making/ A&P Clinical Course as of 06/26/22 2145  Thu Jun 26, 2022  2129 Patient probably asked me during the interview if he could stay because he did not want to go back out in the cold. [CF]  2129 CBC with Differential(!) Normal. [CF]  0000000 Basic metabolic panel(!) Hyponatremia. Will plan to give him salt tablets. Kidney function is normal.  [CF]    Clinical Course User Index [CF] Hendricks Limes, PA-C   {   Click here for ABCD2, HEART and other calculators  Medical Decision Making Jimmy [name] is a 67 y.o. male patient who presents to the emergency department today for further evaluation of bilateral foot pain.  Suspect this is likely related to his feet being wet frequently.  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  I reviewed imaging that was done last month which was normal.  Patient does also have some bilateral eye swelling.  Extraocular movements are intact there is no signs of entrapment or pain to suggest orbital cellulitis.  Will get some basic labs to evaluate for kidney function.  I  will give him some pain medicine and have already given him 3 pairs of hospital socks.  Labs are all normal apart from some hyponatremia. Patient is not altered and answers all questions appropriately. Have given the patient some salt tablets.  Strict turn precautions were discussed.  He is safe for discharge.  Amount and/or Complexity of Data Reviewed Labs: ordered. Decision-making  details documented in ED Course.  Risk OTC drugs. Prescription drug management.    Final Clinical Impression(s) / ED Diagnoses Final diagnoses:  Pain in both feet    Rx / DC Orders ED Discharge Orders     None         Jimmy [name] 06/26/22 2146    Wyvonnia Dusky, MD 06/26/22 2217

## 2022-06-26 NOTE — ED Triage Notes (Signed)
Patient was picked up at [facility] by [facility] for foot pain. He reports blisters and skin peeling off. He was here lastnight but left due to the wait.  162/84-90-98%  CBG: 101

## 2022-07-06 ENCOUNTER — Emergency Department (HOSPITAL_COMMUNITY): Payer: Medicare PPO

## 2022-07-06 ENCOUNTER — Emergency Department (HOSPITAL_COMMUNITY)
Admission: EM | Admit: 2022-07-06 | Discharge: 2022-07-06 | Disposition: A | Discharge status: Home / Self Care | Payer: Medicare PPO | Attending: Emergency Medicine | Admitting: Emergency Medicine

## 2022-07-06 ENCOUNTER — Other Ambulatory Visit: Payer: Self-pay

## 2022-07-06 ENCOUNTER — Encounter (HOSPITAL_COMMUNITY): Payer: Self-pay

## 2022-07-06 DIAGNOSIS — E871 Hypo-osmolality and hyponatremia: Secondary | ICD-10-CM | POA: Insufficient documentation

## 2022-07-06 DIAGNOSIS — M79672 Pain in left foot: Secondary | ICD-10-CM | POA: Insufficient documentation

## 2022-07-06 DIAGNOSIS — M79671 Pain in right foot: Secondary | ICD-10-CM | POA: Diagnosis not present

## 2022-07-06 DIAGNOSIS — Z5900 Homelessness unspecified: Secondary | ICD-10-CM | POA: Insufficient documentation

## 2022-07-06 DIAGNOSIS — M7989 Other specified soft tissue disorders: Secondary | ICD-10-CM | POA: Insufficient documentation

## 2022-07-06 LAB — CBC WITH DIFFERENTIAL/PLATELET
Abs Immature Granulocytes: 0.04 10*3/uL (ref 0.00–0.07)
Basophils Absolute: 0.1 10*3/uL (ref 0.0–0.1)
Basophils Relative: 1 %
Eosinophils Absolute: 1.5 10*3/uL — ABNORMAL HIGH (ref 0.0–0.5)
Eosinophils Relative: 19 %
HCT: 41.1 % (ref 39.0–52.0)
Hemoglobin: 14.5 g/dL (ref 13.0–17.0)
Immature Granulocytes: 1 %
Lymphocytes Relative: 18 %
Lymphs Abs: 1.4 10*3/uL (ref 0.7–4.0)
MCH: 33.1 pg (ref 26.0–34.0)
MCHC: 35.3 g/dL (ref 30.0–36.0)
MCV: 93.8 fL (ref 80.0–100.0)
Monocytes Absolute: 0.9 10*3/uL (ref 0.1–1.0)
Monocytes Relative: 11 %
Neutro Abs: 4 10*3/uL (ref 1.7–7.7)
Neutrophils Relative %: 50 %
Platelets: 333 10*3/uL (ref 150–400)
RBC: 4.38 MIL/uL (ref 4.22–5.81)
RDW: 12 % (ref 11.5–15.5)
WBC: 8 10*3/uL (ref 4.0–10.5)
nRBC: 0 % (ref 0.0–0.2)

## 2022-07-06 LAB — BASIC METABOLIC PANEL
Anion gap: 9 (ref 5–15)
BUN: 7 mg/dL — ABNORMAL LOW (ref 8–23)
CO2: 21 mmol/L — ABNORMAL LOW (ref 22–32)
Calcium: 8.8 mg/dL — ABNORMAL LOW (ref 8.9–10.3)
Chloride: 95 mmol/L — ABNORMAL LOW (ref 98–111)
Creatinine, Ser: 0.59 mg/dL — ABNORMAL LOW (ref 0.61–1.24)
GFR, Estimated: >60 mL/min (ref >60)
Glucose, Bld: 91 mg/dL (ref 70–99)
Potassium: 4.5 mmol/L (ref 3.5–5.1)
Sodium: 125 mmol/L — ABNORMAL LOW (ref 135–145)

## 2022-07-06 LAB — MAGNESIUM: Magnesium: 2.1 mg/dL (ref 1.7–2.4)

## 2022-07-06 MED ORDER — SODIUM CHLORIDE 0.9 % IV BOLUS: 1000.0000 mL | Freq: Once | INTRAVENOUS | Status: DC

## 2022-07-06 MED ORDER — IBUPROFEN 200 MG PO TABS
600.0000 mg | ORAL_TABLET | Freq: Once | ORAL | Status: AC
  Administered 2022-07-06: 600 mg via ORAL
  Filled 2022-07-06: qty 3

## 2022-07-06 MED ORDER — ACETAMINOPHEN 325 MG PO TABS
650.0000 mg | ORAL_TABLET | Freq: Once | ORAL | Status: AC
  Administered 2022-07-06: 650 mg via ORAL
  Filled 2022-07-06: qty 2

## 2022-07-06 NOTE — ED Provider Notes (Signed)
[place] EMERGENCY DEPARTMENT AT [facility] Provider Note   CSN: [id number] Arrival date & time: 07/06/22  1229     History  Chief Complaint  Patient presents with   Foot Pain    Jimmy [name] is a 67 y.o. male.   Foot Pain  Patient presents for bilateral foot pain.  Medical history includes homelessness, alcohol abuse, bipolar disorder, depression, GERD, PUD, schizophrenia.  He was seen in the ED 10 days ago for pain in both feet.  He has been spending prolonged time in wet socks and shoes.  Workup at that time was notable for hyponatremia.  He was given salt tablets and several pairs of dry socks.  He states that since his prior ED visit, he has been changing his socks daily.  He continues to walk long distances throughout each day.  He has had worsened pain on the plantar aspect of his bilateral feet.  He went to a clinic and was given a "little white pill" which did help with the pain.  He is not sure what that medication was.  He has not been taking anything else for pain relief.  Patient denies any other recent symptoms.     Home Medications Prior to Admission medications   Medication Sig Start Date End Date Taking? Authorizing Provider  acetaminophen (TYLENOL) 325 MG tablet Take 2 tablets (650 mg total) by mouth every 6 (six) hours as needed for mild pain (or Fever >/= 101). 01/14/22   Marguerita Merles Latif, DO  acetaminophen (TYLENOL) 325 MG tablet Take 2 tablets (650 mg total) by mouth every 6 (six) hours as needed for up to 60 doses. 04/12/22   Glyn Ade, MD  calcium carbonate (TUMS - DOSED IN MG ELEMENTAL CALCIUM) 500 MG chewable tablet Chew 2 tablets by mouth as needed for indigestion or heartburn.    [provider]  cyanocobalamin 1000 MCG tablet Take 1 tablet (1,000 mcg total) by mouth daily. 01/15/22   Marguerita Merles Latif, DO  diphenhydrAMINE (BENADRYL) 25 mg capsule Take 1 capsule (25 mg total) by mouth every 6 (six) hours as needed for  itching. 01/14/22   Marguerita Merles Latif, DO  folic acid (FOLVITE) 1 MG tablet Take 1 tablet (1 mg total) by mouth daily. 01/15/22   Marguerita Merles Latif, DO  iron polysaccharides (NIFEREX) 150 MG capsule Take 1 capsule (150 mg total) by mouth daily. 01/15/22   Marguerita Merles Latif, DO  Multiple Vitamin (MULTIVITAMIN WITH MINERALS) TABS tablet Take 1 tablet by mouth daily. 01/15/22   Marguerita Merles Latif, DO  mupirocin ointment (BACTROBAN) 2 % Place 1 Application into the nose 2 (two) times daily. 01/14/22   Sheikh, Omair Latif, DO  nicotine (NICODERM CQ - DOSED IN MG/24 HOURS) 21 mg/24hr patch Place 1 patch (21 mg total) onto the skin daily. 01/15/22   Sheikh, Omair Latif, DO  ondansetron (ZOFRAN) 4 MG tablet Take 1 tablet (4 mg total) by mouth every 6 (six) hours as needed for nausea. 01/14/22   Marguerita Merles Latif, DO  silver sulfADIAZINE (SILVADENE) 1 % cream Apply to affected area topically 2 (two) times daily. 01/14/22   Marguerita Merles Latif, DO  thiamine (VITAMIN B-1) 100 MG tablet Take 1 tablet (100 mg total) by mouth daily. 01/15/22   Marguerita Merles Latif, DO  famotidine (PEPCID) 20 MG tablet Take 1 tablet (20 mg total) by mouth 2 (two) times daily. 10/13/18 10/19/18  Arby Barrette, MD      Allergies  Neosporin [neomycin-bacitracin zn-polymyx]    Review of Systems   Review of Systems  Musculoskeletal:        Pain and tenderness to plantar aspects of bilateral feet  Skin:  Positive for wound.  All other systems reviewed and are negative.   Physical Exam Updated Vital Signs BP (!) 147/69   Pulse 95   Temp 99.1 F (37.3 C) (Oral)   Resp 18   Ht  (1.727 m)   Wt 79.8 kg   SpO2 95%   BMI 26.76 kg/m  Physical Exam Vitals and nursing note reviewed.  Constitutional:      General: He is not in acute distress.    Appearance: Normal appearance. He is well-developed. He is not ill-appearing, toxic-appearing or diaphoretic.  HENT:     Head: Normocephalic and atraumatic.      Right Ear: External ear normal.     Left Ear: External ear normal.     Nose: Nose normal.     Mouth/Throat:     Mouth: Mucous membranes are moist.  Eyes:     Extraocular Movements: Extraocular movements intact.     Conjunctiva/sclera: Conjunctivae normal.  Cardiovascular:     Rate and Rhythm: Normal rate and regular rhythm.  Pulmonary:     Effort: Pulmonary effort is normal. No respiratory distress.  Abdominal:     General: There is no distension.     Palpations: Abdomen is soft.     Tenderness: There is no abdominal tenderness.  Musculoskeletal:        General: Swelling and tenderness present.     Cervical back: Normal range of motion and neck supple.     Right lower leg: No edema.     Left lower leg: No edema.  Skin:    General: Skin is warm and dry.     Comments: Dry cracked skin on both feet  Neurological:     General: No focal deficit present.     Mental Status: He is alert and oriented to person, place, and time.     Cranial Nerves: No cranial nerve deficit.     Sensory: No sensory deficit.     Motor: No weakness.     Coordination: Coordination normal.  Psychiatric:        Mood and Affect: Mood normal.        Behavior: Behavior normal.        ED Results / Procedures / Treatments   Labs (all labs ordered are listed, but only abnormal results are displayed) Labs Reviewed  BASIC METABOLIC PANEL - Abnormal; Notable for the following components:      Result Value   Sodium 125 (*)    Chloride 95 (*)    CO2 21 (*)    BUN 7 (*)    Creatinine, Ser 0.59 (*)    Calcium 8.8 (*)    All other components within normal limits  CBC WITH DIFFERENTIAL/PLATELET - Abnormal; Notable for the following components:   Eosinophils Absolute 1.5 (*)    All other components within normal limits  MAGNESIUM    EKG None  Radiology DG Foot 2 Views Right  Result Date: 07/06/2022 CLINICAL DATA:  Plantar foot wound. EXAM: RIGHT FOOT - 2 VIEW COMPARISON:  Right foot x-rays dated  January 10, 2022. FINDINGS: No bone destruction or periosteal reaction. There is no evidence of fracture or dislocation. Unchanged moderate first MTP joint osteoarthritis. Superficial soft tissue irregularity of the plantar arch. IMPRESSION: 1. Superficial soft tissue irregularity of  the plantar arch. No acute osseous abnormality. 2. Unchanged moderate first MTP joint osteoarthritis. Electronically Signed   By: Obie Dredge M.D.   On: 07/06/2022 14:07   DG Foot 2 Views Left  Result Date: 07/06/2022 CLINICAL DATA:  Plantar foot wound. EXAM: LEFT FOOT - 2 VIEW COMPARISON:  Left foot x-rays dated May 16, 2022. FINDINGS: No bony destruction or periosteal reaction. There is no evidence of fracture or dislocation. Unchanged severe first MTP joint osteoarthritis. Soft tissues are unremarkable. IMPRESSION: 1.  No acute osseous abnormality. 2. Unchanged severe first MTP joint osteoarthritis. Electronically Signed   By: Obie Dredge M.D.   On: 07/06/2022 14:06    Procedures Procedures    Medications Ordered in ED Medications  ibuprofen (ADVIL) tablet 600 mg (has no administration in time range)  acetaminophen (TYLENOL) tablet 650 mg (650 mg Oral Given 07/06/22 1330)    ED Course/ Medical Decision Making/ A&P                             Medical Decision Making Amount and/or Complexity of Data Reviewed Labs: ordered. Radiology: ordered.  Risk OTC drugs.   Patient presents for bilateral foot pain.  He was seen in the ED 10 days ago for same complaint.  At that time, he was spending prolonged time in wet dirty socks.  Since that time, he has been changing his socks daily.  He continues to ambulate long distances due to his current social situation.  On arrival in the ED, vital signs are notable for hypertension.  Patient is resting comfortably on exam.  He has dry cracked skin on bilateral feet.  There is mild swelling but minimal erythema.  He has wounds to bilateral plantar aspects of his  feet with associated tenderness.  Patient was given Tylenol for analgesia.  Given his history of hyponatremia, will check lab work today.  Patient is also requesting a sandwich.  Patient was given food to eat while in the ED.  Lab work shows baseline hyponatremia.  Patient was counseled on importance of eating food to avoid beer potomania.  Patient's wounds were dressed with Xeroform and Kerlix.  He was provided with dry socks.  Ibuprofen was given for further analgesia.  He was discharged in stable condition.        Final Clinical Impression(s) / ED Diagnoses Final diagnoses:  Foot pain, bilateral    Rx / DC Orders ED Discharge Orders     None         Gloris Manchester, MD 07/06/22 1434

## 2022-07-06 NOTE — ED Triage Notes (Signed)
Coming from building where he sleeps he is homeless.  Complains of wound on bottom of left foot which he was here for last week.  Took antibiotics as prescribed and pain came back last night.  EMS reports he wears wet socks all the time. CBG 113

## 2022-07-06 NOTE — ED Notes (Signed)
Wounds noted to bilateral feet with scaling and dry since.  Wounds do not appear infected.  Both wounds are located in the arch of bilateral feet.

## 2022-07-06 NOTE — ED Notes (Signed)
Provided with meal tray.

## 2022-07-06 NOTE — Discharge Instructions (Signed)
Remove footwear and dressings and elevate feet when possible.  Keep feet clean and dry.  Soft insoles in her shoes will help.  Take ibuprofen and Tylenol as needed for pain.  Drink more food and drink less beer to help with your low sodium.  Return to the emergency department for any new or worsening symptoms of concern.

## 2022-07-21 ENCOUNTER — Other Ambulatory Visit: Payer: Self-pay

## 2022-07-21 ENCOUNTER — Encounter (HOSPITAL_COMMUNITY): Payer: Self-pay

## 2022-07-21 ENCOUNTER — Emergency Department (HOSPITAL_COMMUNITY)
Admission: EM | Admit: 2022-07-21 | Discharge: 2022-07-21 | Disposition: A | Discharge status: Home / Self Care | Payer: Medicare PPO | Attending: Emergency Medicine | Admitting: Emergency Medicine

## 2022-07-21 ENCOUNTER — Emergency Department (HOSPITAL_COMMUNITY)
Admission: EM | Admit: 2022-07-21 | Discharge: 2022-07-21 | Disposition: A | Discharge status: Home / Self Care | Payer: Medicare PPO | Source: Home / Self Care | Attending: Emergency Medicine | Admitting: Emergency Medicine

## 2022-07-21 DIAGNOSIS — Z5900 Homelessness unspecified: Secondary | ICD-10-CM | POA: Diagnosis not present

## 2022-07-21 DIAGNOSIS — B86 Scabies: Secondary | ICD-10-CM | POA: Insufficient documentation

## 2022-07-21 DIAGNOSIS — M79642 Pain in left hand: Secondary | ICD-10-CM | POA: Diagnosis not present

## 2022-07-21 DIAGNOSIS — M79641 Pain in right hand: Secondary | ICD-10-CM | POA: Diagnosis not present

## 2022-07-21 DIAGNOSIS — L299 Pruritus, unspecified: Secondary | ICD-10-CM | POA: Diagnosis present

## 2022-07-21 MED ORDER — PERMETHRIN 5 % EX CREA
TOPICAL_CREAM | CUTANEOUS | Status: DC
  Filled 2022-07-21: qty 60

## 2022-07-21 NOTE — ED Triage Notes (Signed)
Pt left approximately 10 minutes ago to smoke at the bus stop.  Pt returns to speak with case worker.

## 2022-07-21 NOTE — Discharge Instructions (Signed)
Use the ointment every other day for the next 10 days.

## 2022-07-21 NOTE — Progress Notes (Signed)
TOC consulted for Homeless Resources. This CSW has provided shelter resources to pt's AVS. No further TOC needs.

## 2022-07-21 NOTE — ED Triage Notes (Signed)
BIB EMS, pt is homeless. C/o bilateral hand pain. Pt has been itching his hands for the past week and now has open cuts to both hands.  Pt denies drug use, endorses ETOH use.

## 2022-07-21 NOTE — ED Provider Notes (Signed)
[place] EMERGENCY DEPARTMENT AT [facility] Provider Note   CSN: [id number] Arrival date & time: 07/21/22  1316     History  Chief Complaint  Patient presents with   Hand Pain    Jimmy [name] is a 67 y.o. male.   Hand Pain  Patient presents with itchy hands.  Some pain.  Is homeless.  States for the last week has been having problems.  No fevers. Patient is also homeless.  He states he hopes he can get into a nursing home to live with his brother.  States he feels uncomfortable being homeless.     Home Medications Prior to Admission medications   Medication Sig Start Date End Date Taking? Authorizing Provider  acetaminophen (TYLENOL) 325 MG tablet Take 2 tablets (650 mg total) by mouth every 6 (six) hours as needed for mild pain (or Fever >/= 101). 01/14/22   Marguerita Merles Latif, DO  acetaminophen (TYLENOL) 325 MG tablet Take 2 tablets (650 mg total) by mouth every 6 (six) hours as needed for up to 60 doses. 04/12/22   Glyn Ade, MD  calcium carbonate (TUMS - DOSED IN MG ELEMENTAL CALCIUM) 500 MG chewable tablet Chew 2 tablets by mouth as needed for indigestion or heartburn.    [provider]  cyanocobalamin 1000 MCG tablet Take 1 tablet (1,000 mcg total) by mouth daily. 01/15/22   Marguerita Merles Latif, DO  diphenhydrAMINE (BENADRYL) 25 mg capsule Take 1 capsule (25 mg total) by mouth every 6 (six) hours as needed for itching. 01/14/22   Marguerita Merles Latif, DO  folic acid (FOLVITE) 1 MG tablet Take 1 tablet (1 mg total) by mouth daily. 01/15/22   Marguerita Merles Latif, DO  iron polysaccharides (NIFEREX) 150 MG capsule Take 1 capsule (150 mg total) by mouth daily. 01/15/22   Marguerita Merles Latif, DO  Multiple Vitamin (MULTIVITAMIN WITH MINERALS) TABS tablet Take 1 tablet by mouth daily. 01/15/22   Marguerita Merles Latif, DO  mupirocin ointment (BACTROBAN) 2 % Place 1 Application into the nose 2 (two) times daily. 01/14/22   Sheikh, Omair Latif, DO   nicotine (NICODERM CQ - DOSED IN MG/24 HOURS) 21 mg/24hr patch Place 1 patch (21 mg total) onto the skin daily. 01/15/22   Sheikh, Omair Latif, DO  ondansetron (ZOFRAN) 4 MG tablet Take 1 tablet (4 mg total) by mouth every 6 (six) hours as needed for nausea. 01/14/22   Marguerita Merles Latif, DO  silver sulfADIAZINE (SILVADENE) 1 % cream Apply to affected area topically 2 (two) times daily. 01/14/22   Marguerita Merles Latif, DO  thiamine (VITAMIN B-1) 100 MG tablet Take 1 tablet (100 mg total) by mouth daily. 01/15/22   Marguerita Merles Latif, DO  famotidine (PEPCID) 20 MG tablet Take 1 tablet (20 mg total) by mouth 2 (two) times daily. 10/13/18 10/19/18  Arby Barrette, MD      Allergies    Neosporin [neomycin-bacitracin zn-polymyx]    Review of Systems   Review of Systems  Physical Exam Updated Vital Signs BP (!) 149/82   Pulse 91   Temp 98.2 F (36.8 C) (Oral)   Resp 18   Ht 5\' 8"  (1.727 m)   Wt 72.6 kg   SpO2 100%   BMI 24.33 kg/m  Physical Exam Vitals and nursing note reviewed.  Skin:    Capillary Refill: Capillary refill takes less than 2 seconds.     Comments: Scaling rash between the fingers and on the palms of the hand.  Neurological:     Mental Status: He is alert.     ED Results / Procedures / Treatments   Labs (all labs ordered are listed, but only abnormal results are displayed) Labs Reviewed - No data to display  EKG None  Radiology No results found.  Procedures Procedures    Medications Ordered in ED Medications - No data to display  ED Course/ Medical Decision Making/ A&P                             Medical Decision Making  Patient has rash between fingers.  Itchy.  Does appear somewhat like scabies.  Will treat.  Does not appear to be otherwise infected.  Patient was requesting nursing home patient.  Instructed that we cannot place.  I sent a message for transitional care consult.  They gave resources, however patient left before further  discussion.        Final Clinical Impression(s) / ED Diagnoses Final diagnoses:  Scabies    Rx / DC Orders ED Discharge Orders     None         Benjiman Core, MD 07/21/22 1446

## 2022-07-21 NOTE — ED Provider Notes (Signed)
[place] EMERGENCY DEPARTMENT AT [facility] Provider Note   CSN: [id number] Arrival date & time: 07/21/22  1441     History  Chief Complaint  Patient presents with   Homeless    Jimmy [name] is a 67 y.o. male.  Patient just rejected for social work resources.  He was just seen.  He wanted to smoke a cigarette in the side to come back in.  He is currently homeless.  He wants to go live at a living facility where his brother is.  He is not happy that social work was unable to make that happen for him.  He has no other complaints.  The history is provided by the patient.       Home Medications Prior to Admission medications   Medication Sig Start Date End Date Taking? Authorizing Provider  acetaminophen (TYLENOL) 325 MG tablet Take 2 tablets (650 mg total) by mouth every 6 (six) hours as needed for mild pain (or Fever >/= 101). 01/14/22   Marguerita Merles Latif, DO  acetaminophen (TYLENOL) 325 MG tablet Take 2 tablets (650 mg total) by mouth every 6 (six) hours as needed for up to 60 doses. 04/12/22   Glyn Ade, MD  calcium carbonate (TUMS - DOSED IN MG ELEMENTAL CALCIUM) 500 MG chewable tablet Chew 2 tablets by mouth as needed for indigestion or heartburn.    [provider]  cyanocobalamin 1000 MCG tablet Take 1 tablet (1,000 mcg total) by mouth daily. 01/15/22   Marguerita Merles Latif, DO  diphenhydrAMINE (BENADRYL) 25 mg capsule Take 1 capsule (25 mg total) by mouth every 6 (six) hours as needed for itching. 01/14/22   Marguerita Merles Latif, DO  folic acid (FOLVITE) 1 MG tablet Take 1 tablet (1 mg total) by mouth daily. 01/15/22   Marguerita Merles Latif, DO  iron polysaccharides (NIFEREX) 150 MG capsule Take 1 capsule (150 mg total) by mouth daily. 01/15/22   Marguerita Merles Latif, DO  Multiple Vitamin (MULTIVITAMIN WITH MINERALS) TABS tablet Take 1 tablet by mouth daily. 01/15/22   Marguerita Merles Latif, DO  mupirocin ointment (BACTROBAN) 2 % Place 1 Application  into the nose 2 (two) times daily. 01/14/22   Sheikh, Omair Latif, DO  nicotine (NICODERM CQ - DOSED IN MG/24 HOURS) 21 mg/24hr patch Place 1 patch (21 mg total) onto the skin daily. 01/15/22   Sheikh, Omair Latif, DO  ondansetron (ZOFRAN) 4 MG tablet Take 1 tablet (4 mg total) by mouth every 6 (six) hours as needed for nausea. 01/14/22   Marguerita Merles Latif, DO  silver sulfADIAZINE (SILVADENE) 1 % cream Apply to affected area topically 2 (two) times daily. 01/14/22   Marguerita Merles Latif, DO  thiamine (VITAMIN B-1) 100 MG tablet Take 1 tablet (100 mg total) by mouth daily. 01/15/22   Marguerita Merles Latif, DO  famotidine (PEPCID) 20 MG tablet Take 1 tablet (20 mg total) by mouth 2 (two) times daily. 10/13/18 10/19/18  Arby Barrette, MD      Allergies    Neosporin [neomycin-bacitracin zn-polymyx]    Review of Systems   Review of Systems  Physical Exam Updated Vital Signs BP (!) 146/75 (BP Location: Right Arm)   Pulse 95   Temp 98 F (36.7 C) (Oral)   Resp 18   SpO2 99%  Physical Exam Vitals and nursing note reviewed.  Constitutional:      General: He is not in acute distress.    Appearance: He is well-developed.  HENT:  Head: Normocephalic and atraumatic.  Eyes:     Conjunctiva/sclera: Conjunctivae normal.  Cardiovascular:     Rate and Rhythm: Normal rate and regular rhythm.     Heart sounds: No murmur heard. Pulmonary:     Effort: Pulmonary effort is normal. No respiratory distress.     Breath sounds: Normal breath sounds.  Abdominal:     Palpations: Abdomen is soft.     Tenderness: There is no abdominal tenderness.  Musculoskeletal:        General: No swelling.     Cervical back: Neck supple.  Skin:    General: Skin is warm and dry.     Capillary Refill: Capillary refill takes less than 2 seconds.  Neurological:     Mental Status: He is alert.  Psychiatric:        Mood and Affect: Mood normal.     ED Results / Procedures / Treatments   Labs (all labs ordered  are listed, but only abnormal results are displayed) Labs Reviewed - No data to display  EKG None  Radiology No results found.  Procedures Procedures    Medications Ordered in ED Medications - No data to display  ED Course/ Medical Decision Making/ A&P                             Medical Decision Making  EDRIAN MELUCCI is here looking mostly for social services.  He is ready been seen by social services today.  He is given a list of resources for homelessness and shelters.  We are unable to get him placed in a living facility where his brother is.  Patient was discharged.  This chart was dictated using voice recognition software.  Despite best efforts to proofread,  errors can occur which can change the documentation meaning.         Final Clinical Impression(s) / ED Diagnoses Final diagnoses:  Homeless    Rx / DC Orders ED Discharge Orders     None         Virgina Norfolk, DO 07/21/22 1538

## 2022-07-26 ENCOUNTER — Emergency Department (HOSPITAL_COMMUNITY): Payer: Medicare PPO

## 2022-07-26 ENCOUNTER — Other Ambulatory Visit: Payer: Self-pay

## 2022-07-26 ENCOUNTER — Inpatient Hospital Stay (HOSPITAL_COMMUNITY)
Admission: EM | Admit: 2022-07-26 | Discharge: 2022-07-30 | DRG: 641 | Disposition: A | Discharge status: Home / Self Care | Payer: Medicare PPO | Attending: Internal Medicine | Admitting: Internal Medicine

## 2022-07-26 ENCOUNTER — Encounter (HOSPITAL_COMMUNITY): Payer: Self-pay

## 2022-07-26 DIAGNOSIS — F419 Anxiety disorder, unspecified: Secondary | ICD-10-CM | POA: Diagnosis present

## 2022-07-26 DIAGNOSIS — D72829 Elevated white blood cell count, unspecified: Secondary | ICD-10-CM | POA: Diagnosis present

## 2022-07-26 DIAGNOSIS — Z91148 Patient's other noncompliance with medication regimen for other reason: Secondary | ICD-10-CM

## 2022-07-26 DIAGNOSIS — Z881 Allergy status to other antibiotic agents status: Secondary | ICD-10-CM

## 2022-07-26 DIAGNOSIS — R45850 Homicidal ideations: Secondary | ICD-10-CM | POA: Diagnosis present

## 2022-07-26 DIAGNOSIS — Z5982 Transportation insecurity: Secondary | ICD-10-CM

## 2022-07-26 DIAGNOSIS — B86 Scabies: Secondary | ICD-10-CM | POA: Diagnosis present

## 2022-07-26 DIAGNOSIS — Z79899 Other long term (current) drug therapy: Secondary | ICD-10-CM

## 2022-07-26 DIAGNOSIS — F17210 Nicotine dependence, cigarettes, uncomplicated: Secondary | ICD-10-CM | POA: Diagnosis present

## 2022-07-26 DIAGNOSIS — F319 Bipolar disorder, unspecified: Secondary | ICD-10-CM | POA: Diagnosis present

## 2022-07-26 DIAGNOSIS — E038 Other specified hypothyroidism: Secondary | ICD-10-CM | POA: Diagnosis present

## 2022-07-26 DIAGNOSIS — Z8711 Personal history of peptic ulcer disease: Secondary | ICD-10-CM

## 2022-07-26 DIAGNOSIS — Z841 Family history of disorders of kidney and ureter: Secondary | ICD-10-CM

## 2022-07-26 DIAGNOSIS — F1010 Alcohol abuse, uncomplicated: Secondary | ICD-10-CM | POA: Diagnosis not present

## 2022-07-26 DIAGNOSIS — L03113 Cellulitis of right upper limb: Secondary | ICD-10-CM | POA: Diagnosis present

## 2022-07-26 DIAGNOSIS — E871 Hypo-osmolality and hyponatremia: Principal | ICD-10-CM | POA: Diagnosis present

## 2022-07-26 DIAGNOSIS — K219 Gastro-esophageal reflux disease without esophagitis: Secondary | ICD-10-CM | POA: Diagnosis present

## 2022-07-26 DIAGNOSIS — D721 Eosinophilia, unspecified: Secondary | ICD-10-CM | POA: Diagnosis present

## 2022-07-26 DIAGNOSIS — R45851 Suicidal ideations: Secondary | ICD-10-CM | POA: Diagnosis not present

## 2022-07-26 DIAGNOSIS — Z811 Family history of alcohol abuse and dependence: Secondary | ICD-10-CM

## 2022-07-26 DIAGNOSIS — Z720 Tobacco use: Secondary | ICD-10-CM | POA: Diagnosis present

## 2022-07-26 DIAGNOSIS — M79641 Pain in right hand: Secondary | ICD-10-CM

## 2022-07-26 DIAGNOSIS — F1490 Cocaine use, unspecified, uncomplicated: Secondary | ICD-10-CM | POA: Diagnosis present

## 2022-07-26 DIAGNOSIS — Z5941 Food insecurity: Secondary | ICD-10-CM

## 2022-07-26 DIAGNOSIS — Z5900 Homelessness unspecified: Secondary | ICD-10-CM

## 2022-07-26 LAB — CBC WITH DIFFERENTIAL/PLATELET
Abs Immature Granulocytes: 0.07 10*3/uL (ref 0.00–0.07)
Basophils Absolute: 0.1 10*3/uL (ref 0.0–0.1)
Basophils Relative: 1 %
Eosinophils Absolute: 1.3 10*3/uL — ABNORMAL HIGH (ref 0.0–0.5)
Eosinophils Relative: 11 %
HCT: 40 % (ref 39.0–52.0)
Hemoglobin: 13.8 g/dL (ref 13.0–17.0)
Immature Granulocytes: 1 %
Lymphocytes Relative: 17 %
Lymphs Abs: 2.1 10*3/uL (ref 0.7–4.0)
MCH: 32.8 pg (ref 26.0–34.0)
MCHC: 34.5 g/dL (ref 30.0–36.0)
MCV: 95 fL (ref 80.0–100.0)
Monocytes Absolute: 1 10*3/uL (ref 0.1–1.0)
Monocytes Relative: 9 %
Neutro Abs: 7.3 10*3/uL (ref 1.7–7.7)
Neutrophils Relative %: 61 %
Platelets: 420 10*3/uL — ABNORMAL HIGH (ref 150–400)
RBC: 4.21 MIL/uL — ABNORMAL LOW (ref 4.22–5.81)
RDW: 12.1 % (ref 11.5–15.5)
WBC: 11.8 10*3/uL — ABNORMAL HIGH (ref 4.0–10.5)
nRBC: 0 % (ref 0.0–0.2)

## 2022-07-26 LAB — COMPREHENSIVE METABOLIC PANEL
ALT: 15 U/L (ref 0–44)
AST: 28 U/L (ref 15–41)
Albumin: 4.4 g/dL (ref 3.5–5.0)
Alkaline Phosphatase: 81 U/L (ref 38–126)
Anion gap: 12 (ref 5–15)
BUN: 7 mg/dL — ABNORMAL LOW (ref 8–23)
CO2: 17 mmol/L — ABNORMAL LOW (ref 22–32)
Calcium: 8.4 mg/dL — ABNORMAL LOW (ref 8.9–10.3)
Chloride: 90 mmol/L — ABNORMAL LOW (ref 98–111)
Creatinine, Ser: 0.69 mg/dL (ref 0.61–1.24)
GFR, Estimated: >60 mL/min (ref >60)
Glucose, Bld: 87 mg/dL (ref 70–99)
Potassium: 4.1 mmol/L (ref 3.5–5.1)
Sodium: 119 mmol/L — CL (ref 135–145)
Total Bilirubin: 0.9 mg/dL (ref 0.3–1.2)
Total Protein: 8.8 g/dL — ABNORMAL HIGH (ref 6.5–8.1)

## 2022-07-26 LAB — URINALYSIS, ROUTINE W REFLEX MICROSCOPIC
Bacteria, UA: NONE SEEN
Bilirubin Urine: NEGATIVE
Glucose, UA: NEGATIVE mg/dL
Ketones, ur: NEGATIVE mg/dL
Leukocytes,Ua: NEGATIVE
Nitrite: NEGATIVE
Protein, ur: NEGATIVE mg/dL
Specific Gravity, Urine: 1.004 — ABNORMAL LOW (ref 1.005–1.030)
pH: 5 (ref 5.0–8.0)

## 2022-07-26 LAB — RAPID URINE DRUG SCREEN, HOSP PERFORMED
Amphetamines: NOT DETECTED
Barbiturates: NOT DETECTED
Benzodiazepines: NOT DETECTED
Cocaine: NOT DETECTED
Opiates: NOT DETECTED
Tetrahydrocannabinol: NOT DETECTED

## 2022-07-26 LAB — NA AND K (SODIUM & POTASSIUM), RAND UR
Potassium Urine: 16 mmol/L
Sodium, Ur: 34 mmol/L

## 2022-07-26 LAB — ETHANOL: Alcohol, Ethyl (B): 34 mg/dL — ABNORMAL HIGH (ref <10)

## 2022-07-26 LAB — SALICYLATE LEVEL: Salicylate Lvl: <7 mg/dL — ABNORMAL LOW (ref 7.0–30.0)

## 2022-07-26 LAB — ACETAMINOPHEN LEVEL: Acetaminophen (Tylenol), Serum: <10 ug/mL — ABNORMAL LOW (ref 10–30)

## 2022-07-26 MED ORDER — SODIUM CHLORIDE 0.9 % IV SOLN: INTRAVENOUS | Status: DC

## 2022-07-26 MED ORDER — ACETAMINOPHEN 325 MG PO TABS
650.0000 mg | ORAL_TABLET | Freq: Four times a day (QID) | ORAL | Status: DC | PRN
  Administered 2022-07-27: 650 mg via ORAL
  Filled 2022-07-26: qty 2

## 2022-07-26 MED ORDER — THIAMINE MONONITRATE 100 MG PO TABS
100.0000 mg | ORAL_TABLET | Freq: Every day | ORAL | Status: DC
  Administered 2022-07-26 – 2022-07-27 (×2): 100 mg via ORAL
  Filled 2022-07-26 (×2): qty 1

## 2022-07-26 MED ORDER — ACETAMINOPHEN 650 MG RE SUPP: 650.0000 mg | Freq: Four times a day (QID) | RECTAL | Status: DC | PRN

## 2022-07-26 MED ORDER — LORAZEPAM 2 MG/ML IJ SOLN
1.0000 mg | INTRAMUSCULAR | Status: AC | PRN
  Administered 2022-07-28: 2 mg via INTRAVENOUS
  Filled 2022-07-26: qty 1

## 2022-07-26 MED ORDER — SODIUM CHLORIDE 0.9 % IV BOLUS
500.0000 mL | Freq: Once | INTRAVENOUS | Status: AC
  Administered 2022-07-26: 500 mL via INTRAVENOUS

## 2022-07-26 MED ORDER — ONDANSETRON HCL 4 MG/2ML IJ SOLN: 4.0000 mg | Freq: Four times a day (QID) | INTRAMUSCULAR | Status: DC | PRN

## 2022-07-26 MED ORDER — MELATONIN 3 MG PO TABS
3.0000 mg | ORAL_TABLET | Freq: Every evening | ORAL | Status: DC | PRN
  Administered 2022-07-27: 3 mg via ORAL
  Filled 2022-07-26: qty 1

## 2022-07-26 MED ORDER — ADULT MULTIVITAMIN W/MINERALS CH
1.0000 | ORAL_TABLET | Freq: Every day | ORAL | Status: DC
  Administered 2022-07-26 – 2022-07-30 (×5): 1 via ORAL
  Filled 2022-07-26 (×5): qty 1

## 2022-07-26 MED ORDER — FOLIC ACID 1 MG PO TABS
1.0000 mg | ORAL_TABLET | Freq: Every day | ORAL | Status: DC
  Administered 2022-07-26 – 2022-07-30 (×5): 1 mg via ORAL
  Filled 2022-07-26 (×5): qty 1

## 2022-07-26 MED ORDER — LORAZEPAM 1 MG PO TABS
1.0000 mg | ORAL_TABLET | ORAL | Status: AC | PRN
  Administered 2022-07-26 – 2022-07-28 (×3): 1 mg via ORAL
  Administered 2022-07-28 – 2022-07-29 (×2): 2 mg via ORAL
  Administered 2022-07-29: 1 mg via ORAL
  Filled 2022-07-26: qty 2
  Filled 2022-07-26 (×3): qty 1
  Filled 2022-07-26: qty 2
  Filled 2022-07-26: qty 1

## 2022-07-26 MED ORDER — THIAMINE HCL 100 MG/ML IJ SOLN: 100.0000 mg | Freq: Every day | INTRAMUSCULAR | Status: DC

## 2022-07-26 MED ORDER — NICOTINE 14 MG/24HR TD PT24
14.0000 mg | MEDICATED_PATCH | Freq: Every day | TRANSDERMAL | Status: DC
  Administered 2022-07-27 (×2): 14 mg via TRANSDERMAL
  Filled 2022-07-26 (×2): qty 1

## 2022-07-26 NOTE — ED Provider Notes (Signed)
Care assumed from [facility], PA-C at shift change.  Please see their note for further information.  Briefly: Patient with extensive history of alcohol abuse presents with suicidal ideation.  Reported to previous provider that he wanted to put a gun in his mouth and had previously done so a few months ago.  Denies hallucinations.  He is alert and oriented.  Last drink was this morning.  Plan: needs labs to be medically cleared for psych evaluation. He is under IVC. Labs pending.   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 Osmolality labs ordered, some concern for fluid overload (water intoxication) therefore will only give 500 ml of saline  Patient is alert and oriented, plan for hospitalist admission for hyponatremia. Discussed with patient who is understanding and in agreement.  Discussed patient with hospitalist who accepts patient for admission   Vear Clock 07/26/22 2242    Pricilla Loveless, MD 07/26/22 2329

## 2022-07-26 NOTE — ED Provider Notes (Signed)
[place] EMERGENCY DEPARTMENT AT [facility] Provider Note   CSN: [id number] Arrival date & time: 07/26/22  1713     History {Add pertinent medical, surgical, social history, OB history to HPI:1} Chief Complaint  Patient presents with   Hand Pain   Suicidal    Jimmy [name] is a 67 y.o. male with past medical history of bipolar disorder, alcohol use disorder, housing instability who presents to the ED complaining of suicidal ideation.  Patient initially checked into the ED for right hand pain but on my initial evaluation patient talks about suicidality and level about his right hand.  He tells me that he has had suicidal thoughts for 3 months and previously put a gun in his mouth and a suicide attempt.  He tells me that he plans on breaking beer bottles and slashing both of his wrist with them.  He does not have a timeframe for doing so but tells me that he does have the intention of doing so.  He states that sometimes he has homicidal ideation but this is vague and not directed at a specific person.  He denies auditory or visual hallucinations.  He states that he was psychiatrically hospitalized 2 years ago and found this very helpful and is interested in doing so again today.  He tells me that he is not currently on any psychiatric medications.  He does drink approximately 6-8 beers daily and has had 2 this morning.  Also smokes 1.5 packs/day.  On reevaluation, alongside nursing staff patient states that he came here for his right hand pain but ultimately would like help with his suicidal ideation and plan.  No trauma to the right hand.  Patient does not provide a thorough description of pain in his right hand but states that hurts all over that has been able to move it okay.      Home Medications Prior to Admission medications   Medication Sig Start Date End Date Taking? Authorizing Provider  acetaminophen (TYLENOL) 325 MG tablet Take 2 tablets (650 mg total) by mouth every  6 (six) hours as needed for mild pain (or Fever >/= 101). 01/14/22   Marguerita Merles Latif, DO  acetaminophen (TYLENOL) 325 MG tablet Take 2 tablets (650 mg total) by mouth every 6 (six) hours as needed for up to 60 doses. 04/12/22   Glyn Ade, MD  calcium carbonate (TUMS - DOSED IN MG ELEMENTAL CALCIUM) 500 MG chewable tablet Chew 2 tablets by mouth as needed for indigestion or heartburn.    [provider]  cyanocobalamin 1000 MCG tablet Take 1 tablet (1,000 mcg total) by mouth daily. 01/15/22   Marguerita Merles Latif, DO  diphenhydrAMINE (BENADRYL) 25 mg capsule Take 1 capsule (25 mg total) by mouth every 6 (six) hours as needed for itching. 01/14/22   Marguerita Merles Latif, DO  folic acid (FOLVITE) 1 MG tablet Take 1 tablet (1 mg total) by mouth daily. 01/15/22   Marguerita Merles Latif, DO  iron polysaccharides (NIFEREX) 150 MG capsule Take 1 capsule (150 mg total) by mouth daily. 01/15/22   Marguerita Merles Latif, DO  Multiple Vitamin (MULTIVITAMIN WITH MINERALS) TABS tablet Take 1 tablet by mouth daily. 01/15/22   Marguerita Merles Latif, DO  mupirocin ointment (BACTROBAN) 2 % Place 1 Application into the nose 2 (two) times daily. 01/14/22   Sheikh, Omair Latif, DO  nicotine (NICODERM CQ - DOSED IN MG/24 HOURS) 21 mg/24hr patch Place 1 patch (21 mg total) onto the skin daily.  01/15/22   Sheikh, Omair Latif, DO  ondansetron (ZOFRAN) 4 MG tablet Take 1 tablet (4 mg total) by mouth every 6 (six) hours as needed for nausea. 01/14/22   Marguerita Merles Latif, DO  silver sulfADIAZINE (SILVADENE) 1 % cream Apply to affected area topically 2 (two) times daily. 01/14/22   Marguerita Merles Latif, DO  thiamine (VITAMIN B-1) 100 MG tablet Take 1 tablet (100 mg total) by mouth daily. 01/15/22   Marguerita Merles Latif, DO  famotidine (PEPCID) 20 MG tablet Take 1 tablet (20 mg total) by mouth 2 (two) times daily. 10/13/18 10/19/18  Arby Barrette, MD      Allergies    Neosporin [neomycin-bacitracin zn-polymyx]     Review of Systems   Review of Systems  All other systems reviewed and are negative.   Physical Exam Updated Vital Signs BP (!) 147/72   Pulse 91   Temp 97.7 F (36.5 C) (Oral)   Resp 12   SpO2 98%  Physical Exam Vitals and nursing note reviewed.  Constitutional:      General: He is not in acute distress.    Appearance: Normal appearance.  HENT:     Head: Normocephalic and atraumatic.     Mouth/Throat:     Mouth: Mucous membranes are moist.  Eyes:     Conjunctiva/sclera: Conjunctivae normal.  Cardiovascular:     Rate and Rhythm: Normal rate and regular rhythm.     Heart sounds: No murmur heard. Pulmonary:     Effort: Pulmonary effort is normal.     Breath sounds: Normal breath sounds.  Abdominal:     General: Abdomen is flat.     Palpations: Abdomen is soft.  Musculoskeletal:        General: Normal range of motion.     Cervical back: Normal range of motion and neck supple.     Right lower leg: No edema.     Left lower leg: No edema.     Comments: Nontender right hand, no obvious deformity, no overlying skin changes, neurovascularly intact, moving hand freely without signs of distress, discomfort, or limitations; moving all extremities spontaneously and equally x 4  Skin:    General: Skin is warm and dry.     Capillary Refill: Capillary refill takes less than 2 seconds.  Neurological:     Mental Status: He is alert and oriented to person, place, and time.     GCS: GCS eye subscore is 4. GCS verbal subscore is 5. GCS motor subscore is 6.     Cranial Nerves: Cranial nerves 2-12 are intact.     Motor: Motor function is intact.     Gait: Gait is intact.  Psychiatric:        Attention and Perception: Attention normal.        Mood and Affect: Mood is depressed. Affect is blunt.        Speech: Speech is slurred (slightly). Speech is not rapid and pressured or tangential.        Behavior: Behavior is not agitated, slowed, aggressive, withdrawn, hyperactive or combative.  Behavior is cooperative.        Thought Content: Thought content is not paranoid or delusional. Thought content includes homicidal and suicidal ideation. Thought content includes suicidal plan. Thought content does not include homicidal plan.     ED Results / Procedures / Treatments   Labs (all labs ordered are listed, but only abnormal results are displayed) Labs Reviewed  ETHANOL - Abnormal; Notable for the following  components:      Result Value   Alcohol, Ethyl (B) 34 (*)    All other components within normal limits  CBC WITH DIFFERENTIAL/PLATELET - Abnormal; Notable for the following components:   WBC 11.8 (*)    RBC 4.21 (*)    Platelets 420 (*)    Eosinophils Absolute 1.3 (*)    All other components within normal limits  SALICYLATE LEVEL - Abnormal; Notable for the following components:   Salicylate Lvl <7.0 (*)    All other components within normal limits  ACETAMINOPHEN LEVEL - Abnormal; Notable for the following components:   Acetaminophen (Tylenol), Serum <10 (*)    All other components within normal limits  URINALYSIS, ROUTINE W REFLEX MICROSCOPIC - Abnormal; Notable for the following components:   Color, Urine STRAW (*)    Specific Gravity, Urine 1.004 (*)    Hgb urine dipstick SMALL (*)    All other components within normal limits  COMPREHENSIVE METABOLIC PANEL  RAPID URINE DRUG SCREEN, HOSP PERFORMED    EKG None  Radiology DG Hand Complete Right  Result Date: 07/26/2022 CLINICAL DATA:  Right hand swelling EXAM: RIGHT HAND - COMPLETE 3 VIEW COMPARISON:  None Available. FINDINGS: There is no evidence of fracture or dislocation. Moderate degenerative changes of the triscaphe joint . Soft tissues are unremarkable. IMPRESSION: No acute osseous abnormality. Electronically Signed   By: Allegra Lai M.D.   On: 07/26/2022 18:56    Procedures Procedures  {Document cardiac monitor, telemetry assessment procedure when appropriate:1}  Medications Ordered in  ED Medications  nicotine (NICODERM CQ - dosed in mg/24 hours) patch 14 mg (has no administration in time range)  LORazepam (ATIVAN) tablet 1-4 mg (1 mg Oral Given 07/26/22 1943)    Or  LORazepam (ATIVAN) injection 1-4 mg ( Intravenous See Alternative 07/26/22 1943)  thiamine (VITAMIN B1) tablet 100 mg (100 mg Oral Given 07/26/22 1943)    Or  thiamine (VITAMIN B1) injection 100 mg ( Intravenous See Alternative 07/26/22 1943)  folic acid (FOLVITE) tablet 1 mg (1 mg Oral Given 07/26/22 1943)  multivitamin with minerals tablet 1 tablet (1 tablet Oral Given 07/26/22 1943)    ED Course/ Medical Decision Making/ A&P   {   Click here for ABCD2, HEART and other calculatorsREFRESH Note before signing :1}                          Medical Decision Making Amount and/or Complexity of Data Reviewed Labs: ordered. Radiology: ordered. Decision-making details documented in ED Course.  Risk OTC drugs. Prescription drug management.   Medical Decision Making:   Jimmy [name] is a 67 y.o. male who presented to the ED today with hand pain / suicidal ideation detailed above.    Patient's presentation is complicated by their history of multiple comorbidities.  Complete initial physical exam performed, notably the patient was in NAD. RRR, LCTA. Neurologically intact.  Minimal slurred speech which I suspect is secondary to alcohol intoxication.  Normal exam of the right hand and patient is neurovascularly intact.  No signs of deformity.  He expresses suicidal and homicidal ideation with suicide plan.  He does not appear to be responding to internal stimuli.  He is calm and cooperative. Reviewed and confirmed nursing documentation for past medical history, family history, social history.    Initial Assessment:   With the patient's presentation of hand, differential diagnosis includes but is not limited to fracture, dislocation, sprain, strain, compartment syndrome,  DVT, cellulitis, traumatic injury.   With patient's  presentation of suicidal ideation, differential diagnosis includes but is not limited to decompensated bipolar disorder, acute psychosis, alcohol intoxication, malingering.  This is most consistent with an acute complicated illness  Initial Plan:  X-ray to assess for bony pathology Medical clearance for psychiatric evaluation Objective evaluation as below reviewed   Initial Study Results:   Radiology:  All images reviewed independently. Agree with radiology report at this time.   DG Hand Complete Right  Result Date: 07/26/2022 CLINICAL DATA:  Right hand swelling EXAM: RIGHT HAND - COMPLETE 3 VIEW COMPARISON:  None Available. FINDINGS: There is no evidence of fracture or dislocation. Moderate degenerative changes of the triscaphe joint . Soft tissues are unremarkable. IMPRESSION: No acute osseous abnormality. Electronically Signed   By: Allegra Lai M.D.   On: 07/26/2022 18:56   DG Foot 2 Views Right  Result Date: 07/06/2022 CLINICAL DATA:  Plantar foot wound. EXAM: RIGHT FOOT - 2 VIEW COMPARISON:  Right foot x-rays dated January 10, 2022. FINDINGS: No bone destruction or periosteal reaction. There is no evidence of fracture or dislocation. Unchanged moderate first MTP joint osteoarthritis. Superficial soft tissue irregularity of the plantar arch. IMPRESSION: 1. Superficial soft tissue irregularity of the plantar arch. No acute osseous abnormality. 2. Unchanged moderate first MTP joint osteoarthritis. Electronically Signed   By: Obie Dredge M.D.   On: 07/06/2022 14:07   DG Foot 2 Views Left  Result Date: 07/06/2022 CLINICAL DATA:  Plantar foot wound. EXAM: LEFT FOOT - 2 VIEW COMPARISON:  Left foot x-rays dated May 16, 2022. FINDINGS: No bony destruction or periosteal reaction. There is no evidence of fracture or dislocation. Unchanged severe first MTP joint osteoarthritis. Soft tissues are unremarkable. IMPRESSION: 1.  No acute osseous abnormality. 2. Unchanged severe first MTP joint  osteoarthritis. Electronically Signed   By: Obie Dredge M.D.   On: 07/06/2022 14:06      Final Assessment and Plan:   ***    Clinical Impression:  1. Suicidal ideation   2. Right hand pain   3. Homicidal ideation      Data Unavailable    {Document critical care time when appropriate:1} {Document review of labs and clinical decision tools ie heart score, Chads2Vasc2 etc:1}  {Document your independent review of radiology images, and any outside records:1} {Document your discussion with family members, caretakers, and with consultants:1} {Document social determinants of health affecting pt's care:1} {Document your decision making why or why not admission, treatments were needed:1} Final Clinical Impression(s) / ED Diagnoses Final diagnoses:  Right hand pain  Suicidal ideation  Homicidal ideation    Rx / DC Orders ED Discharge Orders     None
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
## 2022-07-26 NOTE — ED Notes (Signed)
[name] clothing and belongings bagged into 2 personal belonging bags. Contents include: shirt, sweater, pants, 2 socks and 2 shoes; pt arrived with personal rollator and another bag of items. Pt roomed in main ED, wanded by security and given sandwich.

## 2022-07-27 ENCOUNTER — Encounter (HOSPITAL_COMMUNITY): Payer: Self-pay | Admitting: Internal Medicine

## 2022-07-27 ENCOUNTER — Observation Stay (HOSPITAL_COMMUNITY): Payer: Medicare PPO

## 2022-07-27 DIAGNOSIS — F17210 Nicotine dependence, cigarettes, uncomplicated: Secondary | ICD-10-CM | POA: Diagnosis present

## 2022-07-27 DIAGNOSIS — F319 Bipolar disorder, unspecified: Secondary | ICD-10-CM | POA: Diagnosis present

## 2022-07-27 DIAGNOSIS — D721 Eosinophilia, unspecified: Secondary | ICD-10-CM | POA: Diagnosis present

## 2022-07-27 DIAGNOSIS — Z720 Tobacco use: Secondary | ICD-10-CM | POA: Diagnosis not present

## 2022-07-27 DIAGNOSIS — R45850 Homicidal ideations: Secondary | ICD-10-CM | POA: Diagnosis present

## 2022-07-27 DIAGNOSIS — Z79899 Other long term (current) drug therapy: Secondary | ICD-10-CM | POA: Diagnosis not present

## 2022-07-27 DIAGNOSIS — E871 Hypo-osmolality and hyponatremia: Secondary | ICD-10-CM | POA: Diagnosis not present

## 2022-07-27 DIAGNOSIS — F1010 Alcohol abuse, uncomplicated: Secondary | ICD-10-CM | POA: Diagnosis not present

## 2022-07-27 DIAGNOSIS — Z5982 Transportation insecurity: Secondary | ICD-10-CM | POA: Diagnosis not present

## 2022-07-27 DIAGNOSIS — B86 Scabies: Secondary | ICD-10-CM | POA: Diagnosis present

## 2022-07-27 DIAGNOSIS — E038 Other specified hypothyroidism: Secondary | ICD-10-CM | POA: Diagnosis present

## 2022-07-27 DIAGNOSIS — Z881 Allergy status to other antibiotic agents status: Secondary | ICD-10-CM | POA: Diagnosis not present

## 2022-07-27 DIAGNOSIS — R45851 Suicidal ideations: Principal | ICD-10-CM

## 2022-07-27 DIAGNOSIS — F1490 Cocaine use, unspecified, uncomplicated: Secondary | ICD-10-CM | POA: Diagnosis present

## 2022-07-27 DIAGNOSIS — Z5941 Food insecurity: Secondary | ICD-10-CM | POA: Diagnosis not present

## 2022-07-27 DIAGNOSIS — Z5900 Homelessness unspecified: Secondary | ICD-10-CM | POA: Diagnosis not present

## 2022-07-27 DIAGNOSIS — Z91148 Patient's other noncompliance with medication regimen for other reason: Secondary | ICD-10-CM | POA: Diagnosis not present

## 2022-07-27 DIAGNOSIS — Z8711 Personal history of peptic ulcer disease: Secondary | ICD-10-CM | POA: Diagnosis not present

## 2022-07-27 DIAGNOSIS — Z811 Family history of alcohol abuse and dependence: Secondary | ICD-10-CM | POA: Diagnosis not present

## 2022-07-27 DIAGNOSIS — F419 Anxiety disorder, unspecified: Secondary | ICD-10-CM | POA: Diagnosis present

## 2022-07-27 DIAGNOSIS — L03113 Cellulitis of right upper limb: Secondary | ICD-10-CM | POA: Diagnosis present

## 2022-07-27 DIAGNOSIS — K219 Gastro-esophageal reflux disease without esophagitis: Secondary | ICD-10-CM | POA: Diagnosis present

## 2022-07-27 DIAGNOSIS — Z841 Family history of disorders of kidney and ureter: Secondary | ICD-10-CM | POA: Diagnosis not present

## 2022-07-27 DIAGNOSIS — D72829 Elevated white blood cell count, unspecified: Secondary | ICD-10-CM | POA: Diagnosis present

## 2022-07-27 LAB — BRAIN NATRIURETIC PEPTIDE: B Natriuretic Peptide: 8.5 pg/mL (ref 0.0–100.0)

## 2022-07-27 LAB — PROTIME-INR
INR: 1 (ref 0.8–1.2)
Prothrombin Time: 13 seconds (ref 11.4–15.2)

## 2022-07-27 LAB — BASIC METABOLIC PANEL
Anion gap: 8 (ref 5–15)
Anion gap: 8 (ref 5–15)
BUN: 12 mg/dL (ref 8–23)
BUN: 13 mg/dL (ref 8–23)
CO2: 22 mmol/L (ref 22–32)
CO2: 23 mmol/L (ref 22–32)
Calcium: 8.7 mg/dL — ABNORMAL LOW (ref 8.9–10.3)
Calcium: 9 mg/dL (ref 8.9–10.3)
Chloride: 97 mmol/L — ABNORMAL LOW (ref 98–111)
Chloride: 97 mmol/L — ABNORMAL LOW (ref 98–111)
Creatinine, Ser: 0.67 mg/dL (ref 0.61–1.24)
Creatinine, Ser: 0.75 mg/dL (ref 0.61–1.24)
GFR, Estimated: >60 mL/min (ref >60)
GFR, Estimated: >60 mL/min (ref >60)
Glucose, Bld: 173 mg/dL — ABNORMAL HIGH (ref 70–99)
Glucose, Bld: 106 mg/dL — ABNORMAL HIGH (ref 70–99)
Potassium: 4.1 mmol/L (ref 3.5–5.1)
Potassium: 4 mmol/L (ref 3.5–5.1)
Sodium: 127 mmol/L — ABNORMAL LOW (ref 135–145)
Sodium: 128 mmol/L — ABNORMAL LOW (ref 135–145)

## 2022-07-27 LAB — CBC WITH DIFFERENTIAL/PLATELET
Abs Immature Granulocytes: 0.11 10*3/uL — ABNORMAL HIGH (ref 0.00–0.07)
Basophils Absolute: 0.1 10*3/uL (ref 0.0–0.1)
Basophils Relative: 1 %
Eosinophils Absolute: 1.5 10*3/uL — ABNORMAL HIGH (ref 0.0–0.5)
Eosinophils Relative: 19 %
HCT: 37.9 % — ABNORMAL LOW (ref 39.0–52.0)
Hemoglobin: 12.8 g/dL — ABNORMAL LOW (ref 13.0–17.0)
Immature Granulocytes: 1 %
Lymphocytes Relative: 17 %
Lymphs Abs: 1.4 10*3/uL (ref 0.7–4.0)
MCH: 32.2 pg (ref 26.0–34.0)
MCHC: 33.8 g/dL (ref 30.0–36.0)
MCV: 95.5 fL (ref 80.0–100.0)
Monocytes Absolute: 0.9 10*3/uL (ref 0.1–1.0)
Monocytes Relative: 11 %
Neutro Abs: 4.1 10*3/uL (ref 1.7–7.7)
Neutrophils Relative %: 51 %
Platelets: 298 10*3/uL (ref 150–400)
RBC: 3.97 MIL/uL — ABNORMAL LOW (ref 4.22–5.81)
RDW: 12.1 % (ref 11.5–15.5)
WBC: 8.1 10*3/uL (ref 4.0–10.5)
nRBC: 0 % (ref 0.0–0.2)

## 2022-07-27 LAB — TECHNOLOGIST SMEAR REVIEW

## 2022-07-27 LAB — CREATININE, URINE, RANDOM: Creatinine, Urine: 83 mg/dL

## 2022-07-27 LAB — SODIUM, URINE, RANDOM: Sodium, Ur: 91 mmol/L

## 2022-07-27 LAB — MAGNESIUM: Magnesium: 1.8 mg/dL (ref 1.7–2.4)

## 2022-07-27 LAB — T4, FREE: Free T4: 0.63 ng/dL (ref 0.61–1.12)

## 2022-07-27 LAB — TSH: TSH: 6.387 u[IU]/mL — ABNORMAL HIGH (ref 0.350–4.500)

## 2022-07-27 LAB — OSMOLALITY: Osmolality: 275 mOsm/kg (ref 275–295)

## 2022-07-27 LAB — URIC ACID: Uric Acid, Serum: 5 mg/dL (ref 3.7–8.6)

## 2022-07-27 LAB — HIV ANTIBODY (ROUTINE TESTING W REFLEX): HIV Screen 4th Generation wRfx: NONREACTIVE

## 2022-07-27 MED ORDER — PERMETHRIN 5 % EX CREA
TOPICAL_CREAM | CUTANEOUS | Status: DC
  Administered 2022-07-27: 1 via TOPICAL
  Filled 2022-07-27: qty 60

## 2022-07-27 MED ORDER — NICOTINE 21 MG/24HR TD PT24
21.0000 mg | MEDICATED_PATCH | Freq: Every day | TRANSDERMAL | Status: DC
  Administered 2022-07-28 – 2022-07-30 (×3): 21 mg via TRANSDERMAL
  Filled 2022-07-27 (×3): qty 1

## 2022-07-27 MED ORDER — LORATADINE 10 MG PO TABS
10.0000 mg | ORAL_TABLET | Freq: Two times a day (BID) | ORAL | Status: DC
  Administered 2022-07-27 – 2022-07-30 (×6): 10 mg via ORAL
  Filled 2022-07-27 (×6): qty 1

## 2022-07-27 MED ORDER — PERMETHRIN 5 % EX CREA: TOPICAL_CREAM | CUTANEOUS | Status: DC

## 2022-07-27 MED ORDER — ARIPIPRAZOLE 5 MG PO TABS
5.0000 mg | ORAL_TABLET | Freq: Every day | ORAL | Status: DC
  Administered 2022-07-27 – 2022-07-30 (×4): 5 mg via ORAL
  Filled 2022-07-27 (×4): qty 1

## 2022-07-27 MED ORDER — THIAMINE HCL 100 MG/ML IJ SOLN: 250.0000 mg | INTRAVENOUS | Status: DC

## 2022-07-27 MED ORDER — THIAMINE MONONITRATE 100 MG PO TABS: 100.0000 mg | ORAL_TABLET | Freq: Every day | ORAL | Status: DC

## 2022-07-27 MED ORDER — THIAMINE HCL 100 MG/ML IJ SOLN
500.0000 mg | Freq: Three times a day (TID) | INTRAVENOUS | Status: DC
  Administered 2022-07-27 – 2022-07-30 (×8): 500 mg via INTRAVENOUS
  Filled 2022-07-27 (×10): qty 5

## 2022-07-27 MED ORDER — NICOTINE 21 MG/24HR TD PT24: 21.0000 mg | MEDICATED_PATCH | Freq: Every day | TRANSDERMAL | Status: DC | PRN

## 2022-07-27 MED ORDER — DIPHENHYDRAMINE HCL 25 MG PO CAPS
25.0000 mg | ORAL_CAPSULE | Freq: Four times a day (QID) | ORAL | Status: DC | PRN
  Administered 2022-07-27 – 2022-07-30 (×2): 25 mg via ORAL
  Filled 2022-07-27 (×2): qty 1

## 2022-07-27 MED ORDER — PERMETHRIN 5 % EX CREA
TOPICAL_CREAM | Freq: Once | CUTANEOUS | Status: DC
  Filled 2022-07-27: qty 60

## 2022-07-27 NOTE — Progress Notes (Addendum)
PROGRESS NOTE    Jimmy [name]  RUE:[id number] DOB: May 05, 1955 DOA: 07/26/2022 PCP: System, Provider Not In  Chief Complaint  Patient presents with   Hand Pain   Suicidal    Brief Narrative:   Jimmy [name] is Jimmy [name] 67 y.o. male with medical history significant for chronic alcohol abuse, chronic hyponatremia with baseline serum sodium 1 25-1 32, type I bipolar disorder, who is admitted to [facility] on 07/26/2022 with acute on chronic hyponatremia after presenting from home to [facility] ED complaining of suicidal ideations.  Found to have hyponatremia.  Assessment & Plan:   Principal Problem:   Acute hyponatremia Active Problems:   Chronic alcohol abuse   Tobacco abuse   Suicidal ideations   Leukocytosis  Acute Hyponatremia Has improved with IVF, suspect this was hypovolemic Also possible contribution from beer potomania Hold further IVF as he's correcting rapidly  Will continue to trend  Suicidal Ideation  Mood Disorder Psych c/s Suicide precautions  Etoh Abuse CIWA High dose thiamine Appears to be doing ok from withdrawal standpoint, low threshold for librium vs phenobarb  Pruritus  Hx Scabies  Concern for Crusted Scabies Itching in locations typical for scabies infection Concern for crusted scabies with eosinophilia (issues with this going back several months, several images of hands and feet that can be reviewed) Plan for permethrin, ivermectin not currently in house? Will ask for ID assistance  Tobacco Abuse Encourage cessation  Eosinophilia Unclear cause, chronic  Needs further workup -> related to scabies infection?  Elevated TSH Follow free T4, suspect this will need repeating outpatient     DVT prophylaxis: lovenox Code Status: full Family Communication: none Disposition:   Status is: Observation The patient will require care spanning > 2 midnights and should be moved to inpatient because: suicidal ideation, hyponatremia   Consultants:   psychiatry  Procedures:  none  Antimicrobials:  Anti-infectives (From admission, onward)    None       Subjective: "Doesn't know" if he still ahs thoughts of hurting or harming himself   Objective: Vitals:   07/27/22 0400 07/27/22 0500 07/27/22 0600 07/27/22 1043  BP:    132/78  Pulse:    89  Resp: 18 14 (!) 23 18  Temp:    97.9 F (36.6 C)  TempSrc:    Oral  SpO2:    97%  Weight:  77.2 kg      Intake/Output Summary (Last 24 hours) at 07/27/2022 1223 Last data filed at 07/27/2022 1478 Gross per 24 hour  Intake 120 ml  Output 900 ml  Net -780 ml   Filed Weights   07/27/22 0500  Weight: 77.2 kg    Examination:  General exam: Appears calm and comfortable  Respiratory system: unlabored Cardiovascular system: RRR Gastrointestinal system: Abdomen is nondistended, soft and nontender. Central nervous system: Alert and oriented. No focal neurological deficits. Extremities: no LEE   Data Reviewed: I have personally reviewed following labs and imaging studies  CBC: Recent Labs  Lab 07/26/22 1921 07/27/22 0910  WBC 11.8* 8.1  NEUTROABS 7.3 4.1  HGB 13.8 12.8*  HCT 40.0 37.9*  MCV 95.0 95.5  PLT 420* 298    Basic Metabolic Panel: Recent Labs  Lab 07/26/22 1921 07/27/22 0910  NA 119* 127*  K 4.1 4.1  CL 90* 97*  CO2 17* 22  GLUCOSE 87 173*  BUN 7* 12  CREATININE 0.69 0.67  CALCIUM 8.4* 8.7*  MG  --  1.8    GFR: Estimated  Creatinine Clearance: 89.1 mL/min (by C-G formula based on SCr of 0.67 mg/dL).  Liver Function Tests: Recent Labs  Lab 07/26/22 1921  AST 28  ALT 15  ALKPHOS 81  BILITOT 0.9  PROT 8.8*  ALBUMIN 4.4    CBG: No results for input(s): "GLUCAP" in the last 168 hours.   No results found for this or any previous visit (from the past 240 hour(s)).       Radiology Studies: DG Chest Port 1 View  Result Date: 07/27/2022 CLINICAL DATA:  [address] with acute hyponatremia. EXAM: PORTABLE CHEST 1 VIEW COMPARISON:  Portable  01/11/2022 FINDINGS: The heart size and mediastinal contours are within normal limits. There is calcific plaque of the transverse aorta. Both lungs are clear. The visualized skeletal structures are unremarkable apart from thoracic spondylosis. IMPRESSION: No active disease. Aortic atherosclerosis. Electronically Signed   By: Almira Bar M.D.   On: 07/27/2022 07:28   DG Hand Complete Right  Result Date: 07/26/2022 CLINICAL DATA:  Right hand swelling EXAM: RIGHT HAND - COMPLETE 3 VIEW COMPARISON:  None Available. FINDINGS: There is no evidence of fracture or dislocation. Moderate degenerative changes of the triscaphe joint . Soft tissues are unremarkable. IMPRESSION: No acute osseous abnormality. Electronically Signed   By: Allegra Lai M.D.   On: 07/26/2022 18:56        Scheduled Meds:  folic acid  1 mg Oral Daily   multivitamin with minerals  1 tablet Oral Daily   nicotine  14 mg Transdermal Daily   [START ON 08/04/2022] thiamine  100 mg Oral Daily   Continuous Infusions:  thiamine (VITAMIN B1) injection     Followed by   Melene Muller ON 07/30/2022] thiamine (VITAMIN B1) injection       LOS: 0 days    Time spent: over 30 min    Lacretia Nicks, MD [facility]   To contact the attending provider between 7A-7P or the covering provider during after hours 7P-7A, please log into the web site [email or web address] and access using universal [place] password for that web site. If you do not have the password, please call the hospital operator.  07/27/2022, 12:23 PM
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
## 2022-07-27 NOTE — Plan of Care (Signed)
  Problem: Activity: Goal: Risk for activity intolerance will decrease Outcome: Progressing   Problem: Nutrition: Goal: Adequate nutrition will be maintained Outcome: Progressing   Problem: Safety: Goal: Ability to remain free from injury will improve Outcome: Progressing   Problem: Skin Integrity: Goal: Risk for impaired skin integrity will decrease Outcome: Progressing   

## 2022-07-28 DIAGNOSIS — E871 Hypo-osmolality and hyponatremia: Secondary | ICD-10-CM | POA: Diagnosis not present

## 2022-07-28 DIAGNOSIS — R45851 Suicidal ideations: Secondary | ICD-10-CM | POA: Diagnosis not present

## 2022-07-28 DIAGNOSIS — D721 Eosinophilia, unspecified: Secondary | ICD-10-CM

## 2022-07-28 DIAGNOSIS — F1010 Alcohol abuse, uncomplicated: Secondary | ICD-10-CM | POA: Diagnosis not present

## 2022-07-28 LAB — CBC WITH DIFFERENTIAL/PLATELET
Abs Immature Granulocytes: 0.04 10*3/uL (ref 0.00–0.07)
Basophils Absolute: 0.1 10*3/uL (ref 0.0–0.1)
Basophils Relative: 1 %
Eosinophils Absolute: 1.7 10*3/uL — ABNORMAL HIGH (ref 0.0–0.5)
Eosinophils Relative: 19 %
HCT: 36.5 % — ABNORMAL LOW (ref 39.0–52.0)
Hemoglobin: 12.7 g/dL — ABNORMAL LOW (ref 13.0–17.0)
Immature Granulocytes: 1 %
Lymphocytes Relative: 19 %
Lymphs Abs: 1.6 10*3/uL (ref 0.7–4.0)
MCH: 33.3 pg (ref 26.0–34.0)
MCHC: 34.8 g/dL (ref 30.0–36.0)
MCV: 95.8 fL (ref 80.0–100.0)
Monocytes Absolute: 1.1 10*3/uL — ABNORMAL HIGH (ref 0.1–1.0)
Monocytes Relative: 12 %
Neutro Abs: 4.3 10*3/uL (ref 1.7–7.7)
Neutrophils Relative %: 48 %
Platelets: 285 10*3/uL (ref 150–400)
RBC: 3.81 MIL/uL — ABNORMAL LOW (ref 4.22–5.81)
RDW: 12.1 % (ref 11.5–15.5)
WBC: 8.8 10*3/uL (ref 4.0–10.5)
nRBC: 0 % (ref 0.0–0.2)

## 2022-07-28 LAB — COMPREHENSIVE METABOLIC PANEL
ALT: 12 U/L (ref 0–44)
AST: 19 U/L (ref 15–41)
Albumin: 3.6 g/dL (ref 3.5–5.0)
Alkaline Phosphatase: 62 U/L (ref 38–126)
Anion gap: 6 (ref 5–15)
BUN: 11 mg/dL (ref 8–23)
CO2: 22 mmol/L (ref 22–32)
Calcium: 8.5 mg/dL — ABNORMAL LOW (ref 8.9–10.3)
Chloride: 99 mmol/L (ref 98–111)
Creatinine, Ser: 0.8 mg/dL (ref 0.61–1.24)
GFR, Estimated: >60 mL/min (ref >60)
Glucose, Bld: 99 mg/dL (ref 70–99)
Potassium: 3.7 mmol/L (ref 3.5–5.1)
Sodium: 127 mmol/L — ABNORMAL LOW (ref 135–145)
Total Bilirubin: 0.4 mg/dL (ref 0.3–1.2)
Total Protein: 7.3 g/dL (ref 6.5–8.1)

## 2022-07-28 LAB — VITAMIN B12: Vitamin B-12: 201 pg/mL (ref 180–914)

## 2022-07-28 LAB — MAGNESIUM: Magnesium: 1.8 mg/dL (ref 1.7–2.4)

## 2022-07-28 LAB — PHOSPHORUS: Phosphorus: 4.3 mg/dL (ref 2.5–4.6)

## 2022-07-28 LAB — OSMOLALITY, URINE: Osmolality, Ur: 179 mOsm/kg — ABNORMAL LOW (ref 300–900)

## 2022-07-28 MED ORDER — VITAMIN B-12 1000 MCG PO TABS
1000.0000 ug | ORAL_TABLET | Freq: Every day | ORAL | Status: DC
  Administered 2022-07-29 – 2022-07-30 (×2): 1000 ug via ORAL
  Filled 2022-07-28 (×2): qty 1

## 2022-07-28 MED ORDER — CEFAZOLIN SODIUM-DEXTROSE 1-4 GM/50ML-% IV SOLN
1.0000 g | Freq: Three times a day (TID) | INTRAVENOUS | Status: DC
  Administered 2022-07-28 – 2022-07-29 (×3): 1 g via INTRAVENOUS
  Filled 2022-07-28 (×4): qty 50

## 2022-07-28 MED ORDER — KETOROLAC TROMETHAMINE 15 MG/ML IJ SOLN
15.0000 mg | Freq: Four times a day (QID) | INTRAMUSCULAR | Status: DC | PRN
  Administered 2022-07-28 (×2): 15 mg via INTRAVENOUS
  Filled 2022-07-28 (×2): qty 1

## 2022-07-28 MED ORDER — PERMETHRIN 5 % EX CREA
TOPICAL_CREAM | CUTANEOUS | Status: DC
  Filled 2022-07-28: qty 60

## 2022-07-28 NOTE — TOC Initial Note (Signed)
Transition of Care [facility]) - Initial/Assessment Note    Patient Details  Name: Jimmy [name] MRN: [id number] Date of Birth: 1955-07-27  Transition of Care [facility]) CM/SW Contact:    Larrie Kass, LCSW Phone Number: 07/28/2022, 10:27 AM  Clinical Narrative:                  Per chart review pt is under IVC, per  Dr Gasper Sells pt meets inpatient criteria. CSW to fax pt out for inpatient psych when medically stable .TOC to follow.             Patient Goals and CMS Choice            Expected Discharge Plan and Services                                              Prior Living Arrangements/Services                       Activities of Daily Living   ADL Screening (condition at time of admission) Is the patient deaf or have difficulty hearing?: No Does the patient have difficulty seeing, even when wearing glasses/contacts?: No Does the patient have difficulty concentrating, remembering, or making decisions?: No Does the patient have difficulty dressing or bathing?: No Does the patient have difficulty walking or climbing stairs?: No  Permission Sought/Granted                  Emotional Assessment              Admission diagnosis:  Suicidal ideation [R45.851] Acute hyponatremia [E87.1] Hyponatremia [E87.1] Right hand pain [M79.641] Homicidal ideation [R45.850] Patient Active Problem List   Diagnosis Date Noted   Leukocytosis 07/27/2022   Suicidal ideation 07/27/2022   Acute hyponatremia 07/26/2022   Hypomagnesemia 01/14/2022   Scabies 01/14/2022   Cough 01/14/2022   Hypoalbuminemia 01/14/2022   Normocytic anemia 01/14/2022   Cellulitis of both feet 01/10/2022   Cellulitis of left foot 06/22/2021   Pressure injury of skin 06/22/2021   Acute upper GI bleed 09/20/2020   Nicotine dependence, cigarettes, uncomplicated 09/20/2020   Acute blood loss anemia 09/20/2020   Hyponatremia 09/20/2020   Acute gastric ulcer with  hemorrhage    Alcohol abuse with alcohol-induced mood disorder (HCC) 01/09/2016   Homicidal ideation    Chronic alcohol abuse 06/24/2015   Substance induced mood disorder (HCC) 06/24/2015   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   Fracture of bone 06/11/2015   Suicidal ideations 03/02/2015   Polysubstance abuse (HCC) 09/06/2014   GIB (gastrointestinal bleeding) 09/06/2014   Homeless 09/06/2014   GERD (gastroesophageal reflux disease)    PUD (peptic ulcer disease)    Gastroesophageal reflux disease without esophagitis    Hypokalemia    Alcohol abuse 12/05/2013   S/P alcohol detoxification 12/01/2013   Seizure (HCC) 08/24/2013   Tobacco abuse 08/19/2013   PCP:  System, Provider Not In Pharmacy:   [facility] [address], [place] - [address] [place] [address] Phone: [phone] Fax: [phone]  [place] [facility] [address] [place] [address] Phone: [phone] Fax: [phone]     Social Determinants of Health (SDOH) Social History: SDOH Screenings   Food Insecurity: Food Insecurity Present (  01/11/2022)  Housing: High Risk (01/11/2022)  Transportation Needs: Unmet Transportation Needs (01/11/2022)  Utilities: At Risk (01/11/2022)  Tobacco Use: High Risk (07/27/2022)   SDOH Interventions:     Readmission Risk Interventions     No data to display

## 2022-07-28 NOTE — Progress Notes (Signed)
PROGRESS NOTE    Jimmy [name]  ZOX:[id number] DOB: November 19, 1955 DOA: 07/26/2022 PCP: System, Provider Not In  Chief Complaint  Patient presents with   Hand Pain   Suicidal    Brief Narrative:   Jimmy [name] is Jimmy [name] 67 y.o. male with medical history significant for chronic alcohol abuse, chronic hyponatremia with baseline serum sodium 1 25-1 32, type I bipolar disorder, who is admitted to [facility] on 07/26/2022 with acute on chronic hyponatremia after presenting from home to [facility] ED complaining of suicidal ideations.  Found to have hyponatremia.  Assessment & Plan:   Principal Problem:   Acute hyponatremia Active Problems:   Chronic alcohol abuse   Tobacco abuse   Suicidal ideations   Leukocytosis   Suicidal ideation  Acute Hyponatremia Has improved with IVF, suspect this was hypovolemic Also possible contribution from beer potomania Closer to chronic range, will continue to monitor - likely no additional interventions (maybe restricting free water, hold for now)  Suicidal Ideation  Mood Disorder Psych c/s Suicide precautions  Etoh Abuse CIWA High dose thiamine Appears to be doing ok from withdrawal standpoint, low threshold for librium vs phenobarb  Pruritus  Hx Scabies  Concern for Crusted Scabies Right Hand Cellulitis Itching in locations typical for scabies infection Concern for crusted scabies with eosinophilia (issues with this going back several months, several images of hands and feet that can be reviewed) Plan for permethrin, ivermectin not currently in house? Will ask for ID assistance I think his swelling to R hand is likely cellulitis, limited ROM - start ancef - low threshold for additional imaging, plain films negative  Eosinophilia Unclear cause, chronic -> appears to have peaked when he was admitted in October of last year for cellulitis of his lower extremities Needs further workup -> I suspect this is related to his scabies  infection Strongyloides ab pending  Tobacco Abuse Encourage cessation  Subclinical Hypothyroidism Follow outpatient  Low Normal B12 Follow MMA B12 supplementation      DVT prophylaxis: lovenox Code Status: full Family Communication: none Disposition:   Status is: Observation The patient will require care spanning > 2 midnights and should be moved to inpatient because: suicidal ideation, hyponatremia   Consultants:  psychiatry  Procedures:  none  Antimicrobials:  Anti-infectives (From admission, onward)    Start     Dose/Rate Route Frequency Ordered Stop   07/28/22 1400  ceFAZolin (ANCEF) IVPB 1 g/50 mL premix        1 g 100 mL/hr over 30 Minutes Intravenous Every 8 hours 07/28/22 1228 08/04/22 1359       Subjective: Says somethings wrong with his hand  Objective: Vitals:   07/27/22 2100 07/28/22 0424 07/28/22 0500 07/28/22 1143  BP: (!) 142/84 128/75  (!) 148/78  Pulse: 87 80  96  Resp:    16  Temp: 98.7 F (37.1 C) 98.3 F (36.8 C)  98.4 F (36.9 C)  TempSrc: Oral Oral  Oral  SpO2: 100% 98%  100%  Weight:   74.6 kg     Intake/Output Summary (Last 24 hours) at 07/28/2022 1228 Last data filed at 07/28/2022 1112 Gross per 24 hour  Intake 1680 ml  Output 402 ml  Net 1278 ml   Filed Weights   07/27/22 0500 07/28/22 0500  Weight: 77.2 kg 74.6 kg    Examination:  General: No acute distress. Lungs: unlabored Neurological: Alert and oriented 3. Moves all extremities 4. Cranial nerves II through XII grossly intact. SKIN:  persistent skin changes concerning for scabies infection Extremities: R hand swelling, less range of motion, unable to close hand into fist, mild erythema, mild swelling along dorsal MCPs   Data Reviewed: I have personally reviewed following labs and imaging studies  CBC: Recent Labs  Lab 07/26/22 1921 07/27/22 0910 07/28/22 0420  WBC 11.8* 8.1 8.8  NEUTROABS 7.3 4.1 4.3  HGB 13.8 12.8* 12.7*  HCT 40.0 37.9* 36.5*  MCV  95.0 95.5 95.8  PLT 420* 298 285    Basic Metabolic Panel: Recent Labs  Lab 07/26/22 1921 07/27/22 0910 07/27/22 1519 07/28/22 0420  NA 119* 127* 128* 127*  K 4.1 4.1 4.0 3.7  CL 90* 97* 97* 99  CO2 17* 22 23 22   GLUCOSE 87 173* 106* 99  BUN 7* 12 13 11   CREATININE 0.69 0.67 0.75 0.80  CALCIUM 8.4* 8.7* 9.0 8.5*  MG  --  1.8  --  1.8  PHOS  --   --   --  4.3    GFR: Estimated Creatinine Clearance: 89.1 mL/min (by C-G formula based on SCr of 0.8 mg/dL).  Liver Function Tests: Recent Labs  Lab 07/26/22 1921 07/28/22 0420  AST 28 19  ALT 15 12  ALKPHOS 81 62  BILITOT 0.9 0.4  PROT 8.8* 7.3  ALBUMIN 4.4 3.6    CBG: No results for input(s): "GLUCAP" in the last 168 hours.   No results found for this or any previous visit (from the past 240 hour(s)).       Radiology Studies: DG Chest Port 1 View  Result Date: 07/27/2022 CLINICAL DATA:  [address] with acute hyponatremia. EXAM: PORTABLE CHEST 1 VIEW COMPARISON:  Portable 01/11/2022 FINDINGS: The heart size and mediastinal contours are within normal limits. There is calcific plaque of the transverse aorta. Both lungs are clear. The visualized skeletal structures are unremarkable apart from thoracic spondylosis. IMPRESSION: No active disease. Aortic atherosclerosis. Electronically Signed   By: Almira Bar M.D.   On: 07/27/2022 07:28   DG Hand Complete Right  Result Date: 07/26/2022 CLINICAL DATA:  Right hand swelling EXAM: RIGHT HAND - COMPLETE 3 VIEW COMPARISON:  None Available. FINDINGS: There is no evidence of fracture or dislocation. Moderate degenerative changes of the triscaphe joint . Soft tissues are unremarkable. IMPRESSION: No acute osseous abnormality. Electronically Signed   By: Allegra Lai M.D.   On: 07/26/2022 18:56        Scheduled Meds:  ARIPiprazole  5 mg Oral Daily   [START ON 07/29/2022] vitamin B-12  1,000 mcg Oral Daily   folic acid  1 mg Oral Daily   loratadine  10 mg Oral BID    multivitamin with minerals  1 tablet Oral Daily   nicotine  21 mg Transdermal Daily   permethrin   Topical QODAY   [START ON 08/05/2022] thiamine  100 mg Oral Daily   Continuous Infusions:   ceFAZolin (ANCEF) IV     thiamine (VITAMIN B1) injection 500 mg (07/28/22 0100)   Followed by   Melene Muller ON 07/31/2022] thiamine (VITAMIN B1) injection       LOS: 1 day    Time spent: over 30 min    Lacretia Nicks, MD [facility]   To contact the attending provider between 7A-7P or the covering provider during after hours 7P-7A, please log into the web site [email or web address] and access using universal [place] password for that web site. If you do not have the password, please call the hospital operator.  07/28/2022, 12:28  PM    
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
## 2022-07-29 DIAGNOSIS — E871 Hypo-osmolality and hyponatremia: Secondary | ICD-10-CM | POA: Diagnosis not present

## 2022-07-29 LAB — BASIC METABOLIC PANEL
Anion gap: 6 (ref 5–15)
BUN: 22 mg/dL (ref 8–23)
CO2: 22 mmol/L (ref 22–32)
Calcium: 8.9 mg/dL (ref 8.9–10.3)
Chloride: 101 mmol/L (ref 98–111)
Creatinine, Ser: 0.85 mg/dL (ref 0.61–1.24)
GFR, Estimated: >60 mL/min (ref >60)
Glucose, Bld: 101 mg/dL — ABNORMAL HIGH (ref 70–99)
Potassium: 4.2 mmol/L (ref 3.5–5.1)
Sodium: 129 mmol/L — ABNORMAL LOW (ref 135–145)

## 2022-07-29 LAB — CBC
HCT: 34.8 % — ABNORMAL LOW (ref 39.0–52.0)
Hemoglobin: 11.8 g/dL — ABNORMAL LOW (ref 13.0–17.0)
MCH: 32.9 pg (ref 26.0–34.0)
MCHC: 33.9 g/dL (ref 30.0–36.0)
MCV: 96.9 fL (ref 80.0–100.0)
Platelets: 288 10*3/uL (ref 150–400)
RBC: 3.59 MIL/uL — ABNORMAL LOW (ref 4.22–5.81)
RDW: 12.2 % (ref 11.5–15.5)
WBC: 8.8 10*3/uL (ref 4.0–10.5)
nRBC: 0 % (ref 0.0–0.2)

## 2022-07-29 LAB — STRONGYLOIDES, AB, IGG: Strongyloides, Ab, IgG: NEGATIVE

## 2022-07-29 MED ORDER — CEFADROXIL 500 MG PO CAPS
500.0000 mg | ORAL_CAPSULE | Freq: Two times a day (BID) | ORAL | Status: DC
  Administered 2022-07-29 – 2022-07-30 (×2): 500 mg via ORAL
  Filled 2022-07-29 (×2): qty 1

## 2022-07-29 MED ORDER — IVERMECTIN 3 MG PO TABS: 200.0000 ug/kg | ORAL_TABLET | Freq: Once | ORAL | Status: DC

## 2022-07-29 MED ORDER — ALUM & MAG HYDROXIDE-SIMETH 200-200-20 MG/5ML PO SUSP
30.0000 mL | Freq: Once | ORAL | Status: AC
  Administered 2022-07-29: 30 mL via ORAL
  Filled 2022-07-29: qty 30

## 2022-07-29 MED ORDER — IVERMECTIN 3 MG PO TABS: 200.0000 ug/kg | ORAL_TABLET | Freq: Every day | ORAL | Status: DC

## 2022-07-29 MED ORDER — IVERMECTIN 3 MG PO TABS
200.0000 ug/kg | ORAL_TABLET | Freq: Every day | ORAL | Status: AC
  Administered 2022-07-29 – 2022-07-30 (×2): 15000 ug via ORAL
  Filled 2022-07-29 (×3): qty 5

## 2022-07-29 NOTE — Progress Notes (Signed)
Mobility Specialist - Progress Note   07/29/22 1138  Mobility  Activity Ambulated with assistance in hallway  Level of Assistance Modified independent, requires aide device or extra time  Assistive Device Front wheel walker  Distance Ambulated (ft) 225 ft  Activity Response Tolerated well  Mobility Referral Yes  $Mobility charge 1 Mobility  Mobility Specialist Stop Time (ACUTE ONLY) 1139   Pt received in bed and agreeable to mobility. No complaints during session. Pt to bathroom with sitter after session with all needs met.    [facility]

## 2022-07-29 NOTE — Progress Notes (Signed)
PROGRESS NOTE    SHAYMUS PEPPLE  ZOX:[id number] DOB: 06/03/1955 DOA: 07/26/2022 PCP: System, Provider Not In  Chief Complaint  Patient presents with   Hand Pain   Suicidal    Brief Narrative:   DEMETERIUS KREISMAN is Tion Tse 67 y.o. male with medical history significant for chronic alcohol abuse, chronic hyponatremia with baseline serum sodium 1 25-1 32, type I bipolar disorder, who is admitted to [facility] on 07/26/2022 with acute on chronic hyponatremia after presenting from home to [facility] ED complaining of suicidal ideations.  Found to have hyponatremia.  Discharge to inpatient psych complicated by crusted scabies.  Assessment & Plan:   Principal Problem:   Acute hyponatremia Active Problems:   Chronic alcohol abuse   Tobacco abuse   Suicidal ideations   Leukocytosis   Suicidal ideation  Acute Hyponatremia Has improved with IVF, suspect this was hypovolemic Also possible contribution from beer potomania Closer to chronic range, will continue to monitor - likely no additional interventions (maybe restricting free water, hold for now)  Suicidal Ideation  Mood Disorder Psych c/s Suicide precautions  Etoh Abuse CIWA High dose thiamine Appears to be doing ok from withdrawal standpoint, low threshold for librium vs phenobarb  Pruritus  Hx Scabies  Concern for Crusted Scabies Right Hand Cellulitis Itching in locations typical for scabies infection Concern for crusted scabies with eosinophilia (issues with this going back several months, several images of hands and feet that can be reviewed) Plan for permethrin, ivermectin --> appreciate ID assistance (not sure how long he'll need to stay prior to transferring to inpatient psych, Dr. Gasper Sells noted he'd have to be free of scabies for most inpatient psychiatric hospitals.  Will need to discuss with ID as his course for crusted scabies is technically several weeks.) I think his swelling to R hand is likely cellulitis, limited  ROM - start ancef -> duricef x4 more days --- low threshold for additional imaging, plain films negative  Eosinophilia Unclear cause, chronic -> appears to have peaked when he was admitted in October of last year for cellulitis of his lower extremities Needs further workup -> I suspect this is related to his scabies infection Strongyloides ab negative  Tobacco Abuse Encourage cessation  Subclinical Hypothyroidism Follow outpatient  Low Normal B12 Follow MMA B12 supplementation      DVT prophylaxis: lovenox Code Status: full Family Communication: none Disposition:   Status is: Observation The patient will require care spanning > 2 midnights and should be moved to inpatient because: suicidal ideation, hyponatremia   Consultants:  psychiatry  Procedures:  none  Antimicrobials:  Anti-infectives (From admission, onward)    Start     Dose/Rate Route Frequency Ordered Stop   08/12/22 1000  ivermectin (STROMECTOL) tablet 15,000 mcg       See Hyperspace for full Linked Orders Report.   200 mcg/kg  74.3 kg Oral  Once 07/29/22 0736     08/05/22 1000  ivermectin (STROMECTOL) tablet 15,000 mcg       See Hyperspace for full Linked Orders Report.   200 mcg/kg  74.3 kg Oral Daily 07/29/22 0736 08/07/22 0959   07/29/22 1000  ivermectin (STROMECTOL) tablet 15,000 mcg       See Hyperspace for full Linked Orders Report.   200 mcg/kg  74.3 kg Oral Daily 07/29/22 0736 07/31/22 0959   07/28/22 1400  ceFAZolin (ANCEF) IVPB 1 g/50 mL premix        1 g 100 mL/hr over 30 Minutes Intravenous  Every 8 hours 07/28/22 1228 08/04/22 1359       Subjective: Itching better  Objective: Vitals:   07/28/22 2154 07/29/22 0329 07/29/22 0500 07/29/22 1307  BP: 137/69 136/78  129/60  Pulse: 88 80  85  Resp: (!) 23 (!) 22  20  Temp: 98.4 F (36.9 C) (!) 97.4 F (36.3 C)  98.2 F (36.8 C)  TempSrc:    Oral  SpO2: 99% 98%  99%  Weight:   74.2 kg   Height:   5\' 8"  (1.727 m)      Intake/Output Summary (Last 24 hours) at 07/29/2022 1533 Last data filed at 07/29/2022 1506 Gross per 24 hour  Intake 2863 ml  Output 101 ml  Net 2762 ml   Filed Weights   07/27/22 0500 07/28/22 0500 07/29/22 0500  Weight: 77.2 kg 74.6 kg 74.2 kg    Examination:  General: No acute distress. Cardiovascular: RRR Lungs: unlabored Neurological: Alert and oriented 3. Moves all extremities 4 with equal strength. Cranial nerves II through XII grossly intact. Extremities: No clubbing or cyanosis. No edema.  Data Reviewed: I have personally reviewed following labs and imaging studies  CBC: Recent Labs  Lab 07/26/22 1921 07/27/22 0910 07/28/22 0420 07/29/22 0416  WBC 11.8* 8.1 8.8 8.8  NEUTROABS 7.3 4.1 4.3  --   HGB 13.8 12.8* 12.7* 11.8*  HCT 40.0 37.9* 36.5* 34.8*  MCV 95.0 95.5 95.8 96.9  PLT 420* 298 285 288    Basic Metabolic Panel: Recent Labs  Lab 07/26/22 1921 07/27/22 0910 07/27/22 1519 07/28/22 0420 07/29/22 0416  NA 119* 127* 128* 127* 129*  K 4.1 4.1 4.0 3.7 4.2  CL 90* 97* 97* 99 101  CO2 17* 22 23 22 22   GLUCOSE 87 173* 106* 99 101*  BUN 7* 12 13 11 22   CREATININE 0.69 0.67 0.75 0.80 0.85  CALCIUM 8.4* 8.7* 9.0 8.5* 8.9  MG  --  1.8  --  1.8  --   PHOS  --   --   --  4.3  --     GFR: Estimated Creatinine Clearance: 83.8 mL/min (by C-G formula based on SCr of 0.85 mg/dL).  Liver Function Tests: Recent Labs  Lab 07/26/22 1921 07/28/22 0420  AST 28 19  ALT 15 12  ALKPHOS 81 62  BILITOT 0.9 0.4  PROT 8.8* 7.3  ALBUMIN 4.4 3.6    CBG: No results for input(s): "GLUCAP" in the last 168 hours.   No results found for this or any previous visit (from the past 240 hour(s)).       Radiology Studies: No results found.      Scheduled Meds:  ARIPiprazole  5 mg Oral Daily   vitamin B-12  1,000 mcg Oral Daily   folic acid  1 mg Oral Daily   ivermectin  200 mcg/kg Oral Daily   Followed by   Melene Muller ON 08/05/2022] ivermectin  200  mcg/kg Oral Daily   Followed by   Melene Muller ON 08/12/2022] ivermectin  200 mcg/kg Oral Once   loratadine  10 mg Oral BID   multivitamin with minerals  1 tablet Oral Daily   nicotine  21 mg Transdermal Daily   permethrin   Topical QODAY   [START ON 08/05/2022] thiamine  100 mg Oral Daily   Continuous Infusions:   ceFAZolin (ANCEF) IV 1 g (07/29/22 9629)   thiamine (VITAMIN B1) injection 500 mg (07/29/22 5284)   Followed by   Melene Muller ON 07/31/2022] thiamine (VITAMIN B1) injection  LOS: 2 days    Time spent: over 30 min    Lacretia Nicks, MD [facility]   To contact the attending provider between 7A-7P or the covering provider during after hours 7P-7A, please log into the web site [email or web address] and access using universal [place] password for that web site. If you do not have the password, please call the hospital operator.  07/29/2022, 3:33 PM
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
## 2022-07-30 ENCOUNTER — Other Ambulatory Visit (HOSPITAL_COMMUNITY): Payer: Self-pay

## 2022-07-30 DIAGNOSIS — E871 Hypo-osmolality and hyponatremia: Secondary | ICD-10-CM | POA: Diagnosis not present

## 2022-07-30 DIAGNOSIS — R45851 Suicidal ideations: Secondary | ICD-10-CM | POA: Diagnosis not present

## 2022-07-30 DIAGNOSIS — F1010 Alcohol abuse, uncomplicated: Secondary | ICD-10-CM | POA: Diagnosis not present

## 2022-07-30 DIAGNOSIS — D721 Eosinophilia, unspecified: Secondary | ICD-10-CM | POA: Diagnosis not present

## 2022-07-30 LAB — PHOSPHORUS: Phosphorus: 3.8 mg/dL (ref 2.5–4.6)

## 2022-07-30 LAB — BASIC METABOLIC PANEL
Anion gap: 7 (ref 5–15)
BUN: 14 mg/dL (ref 8–23)
CO2: 24 mmol/L (ref 22–32)
Calcium: 8.9 mg/dL (ref 8.9–10.3)
Chloride: 99 mmol/L (ref 98–111)
Creatinine, Ser: 0.72 mg/dL (ref 0.61–1.24)
GFR, Estimated: >60 mL/min (ref >60)
Glucose, Bld: 100 mg/dL — ABNORMAL HIGH (ref 70–99)
Potassium: 4.3 mmol/L (ref 3.5–5.1)
Sodium: 130 mmol/L — ABNORMAL LOW (ref 135–145)

## 2022-07-30 LAB — CBC
HCT: 37.7 % — ABNORMAL LOW (ref 39.0–52.0)
Hemoglobin: 12.6 g/dL — ABNORMAL LOW (ref 13.0–17.0)
MCH: 32.7 pg (ref 26.0–34.0)
MCHC: 33.4 g/dL (ref 30.0–36.0)
MCV: 97.9 fL (ref 80.0–100.0)
Platelets: 324 10*3/uL (ref 150–400)
RBC: 3.85 MIL/uL — ABNORMAL LOW (ref 4.22–5.81)
RDW: 12.4 % (ref 11.5–15.5)
WBC: 7.6 10*3/uL (ref 4.0–10.5)
nRBC: 0 % (ref 0.0–0.2)

## 2022-07-30 LAB — MAGNESIUM: Magnesium: 1.7 mg/dL (ref 1.7–2.4)

## 2022-07-30 MED ORDER — PERMETHRIN 5 % EX CREA: TOPICAL_CREAM | CUTANEOUS | 0 refills | Status: DC

## 2022-07-30 MED ORDER — NICOTINE 7 MG/24HR TD PT24: 7.0000 mg | MEDICATED_PATCH | Freq: Once | TRANSDERMAL | Status: DC | PRN

## 2022-07-30 MED ORDER — IVERMECTIN 3 MG PO TABS: 200.0000 ug/kg | ORAL_TABLET | ORAL | 0 refills | Status: DC

## 2022-07-30 MED ORDER — CEFADROXIL 500 MG PO CAPS: 500.0000 mg | ORAL_CAPSULE | Freq: Two times a day (BID) | ORAL | 0 refills | Status: AC

## 2022-07-30 MED ORDER — FOLIC ACID 1 MG PO TABS: 1.0000 mg | ORAL_TABLET | Freq: Every day | ORAL | 0 refills | Status: DC

## 2022-07-30 MED ORDER — VITAMIN B-1 100 MG PO TABS: 100.0000 mg | ORAL_TABLET | Freq: Every day | ORAL | 0 refills | Status: DC

## 2022-07-30 MED ORDER — IVERMECTIN 3 MG PO TABS: ORAL_TABLET | ORAL | 0 refills | Status: DC

## 2022-07-30 MED ORDER — ARIPIPRAZOLE 5 MG PO TABS: 5.0000 mg | ORAL_TABLET | Freq: Every day | ORAL | 0 refills | Status: DC

## 2022-07-30 NOTE — Progress Notes (Signed)
Patient refused PIV

## 2022-07-30 NOTE — Progress Notes (Signed)
Pt left prior to receiving AVS. Pt directed by this RN to check in with his pharmacy later and make sure to pick up his prescription medicines. Pt insists on leaving to smoke a cigarette and does not want to wait for AVS. MD made aware.

## 2022-07-30 NOTE — Progress Notes (Signed)
[facility] Infectious Diseases Follow Up Note  Patient Identification: Patient Name: Jimmy [name] MRN: [id number] Admit Date: 07/26/2022  5:19 PM Age: 67 y.o.Today's Date: 07/30/2022  Reason for Visit: Possible scabies infestation  Principal Problem:   Acute hyponatremia Active Problems:   Tobacco abuse   Suicidal ideations   Chronic alcohol abuse   Leukocytosis   Suicidal ideation   Antibiotics: Cefazolin 5/6, cefadroxil 5/7- Ivermectin 5/7  Lines/Hardwares:   Interval Events: Continues to be afebrile, ivermectin was started with assistance from ID pharmacy   Assessment 67 year old male with history of alcohol abuse, tobacco abuse, bipolar 1 disorder, schizophrenia depression, GERD/PUD,  who presented to the ED 5/4 with suicidal and homicidal  ideation.   # Possible Norwegian/Crusted scabies/itching  Reports prior h/o scabies tx that resolved with topical cream  Patient is already started on permethrin 5% cream by hospitalist on 5/6 for every alternate days. Ivermectin added 5/7 for combination therapy  Confirmed with RN that he is getting cream applied every alternate days bedtime, last application was 5/7. Instructed RN to apply cream on all involved areas from chin down to soles of the feet including under finger nails and toe nails  He is not immunocompromised or on immunocompromised medications. HIV is NR  IP has been informed    # ? rt hand cellulitis  - rt hand xray negative for acute abnormality  - Started on cefazolin by Hospitalist > cefadroxil    # Chronic Eosinophilia  - possibly related to scabies which he seems to have for a while  - 5/5 strongyloides ab IgG negative    # Hyponatremia - improving  # Alcohol abuse - Hospitalist managing    # Suicidal ideation  - Psych following   Recommendations Permethrin 5% cream topical application every alternate days for 2 weeks + Oral Ivermectin 232mcg/kg  dose for 7 doses in non consecutive days (  day 1, 2, 8, 9, 15, 22, and 29)   Patient will need to be taught regarding permethrin cream application if discharged for OP Psych follow up  Complete course of cefadroxil, EOT 5/12 ID available as needed.   Rest of the management as per the primary team. Thank you for the consult. Please page with pertinent questions or concerns.  ______________________________________________________________________ Subjective patient seen and examined at the bedside. Reports itching is improving, itching is mostly in his bilateral feet and less in his hands.   Vitals BP (!) 145/79 (BP Location: Left Arm)   Pulse 75   Temp 98.2 F (36.8 C)   Resp 16   Ht 5\' 8"  (1.727 m)   Wt 80.5 kg   SpO2 98%   BMI 26.98 kg/m     Physical Exam Constitutional: adult male lying in the bed and appears comfortable     Comments:   Cardiovascular:     Rate and Rhythm: Normal rate and regular rhythm.     Heart sounds: .   Pulmonary:     Effort: Pulmonary effort is normal on room air     Comments:   Abdominal:     Palpations: Abdomen is soft.     Tenderness: non distended and non tender   Musculoskeletal:        General: No swelling or tenderness  in peripheral joints. No swelling, redness and tenderness in  rt hand, wrist and forearm, able to make a complete fist with rt hand   Skin:    Comments: No rashes   Neurological:  General: awake, alert and oriented, following commands   Psychiatric:        Mood and Affect: Mood normal.   Pertinent Microbiology Results for orders placed or performed during the hospital encounter of 01/10/22  MRSA Next Gen by PCR, Nasal     Status: Abnormal   Collection Time: 01/10/22  7:18 PM   Specimen: Nasal Mucosa; Nasal Swab  Result Value Ref Range Status   MRSA by PCR Next Gen DETECTED (A) NOT DETECTED Final    Comment: CRITICAL RESULT CALLED TO, READ BACK BY AND VERIFIED WITH: KAUR,R AT 0005 ON 01/11/22 BY  LUZOLOP (NOTE) The GeneXpert MRSA Assay (FDA approved for NASAL specimens only), is one component of a comprehensive MRSA colonization surveillance program. It is not intended to diagnose MRSA infection nor to guide or monitor treatment for MRSA infections. Test performance is not FDA approved in patients less than 86 years old. Performed at [facility], [address], [place], [place] [address]   Aerobic/Anaerobic Culture w Gram Stain (surgical/deep wound)     Status: None   Collection Time: 01/10/22  7:19 PM   Specimen: Foot  Result Value Ref Range Status   Specimen Description   Final    FOOT PUSTULES FROM BILATERAL FEEL Performed at [facility], [address] [place], [place] [address]    Special Requests   Final    NONE Performed at [facility], [address], [place], [place] [address]    Gram Stain   Final    NO WBC SEEN MODERATE GRAM POSITIVE COCCI IN PAIRS FEW GRAM POSITIVE RODS RARE GRAM NEGATIVE RODS    Culture   Final    ABUNDANT STREPTOCOCCUS GROUP C Beta hemolytic streptococci are predictably susceptible to penicillin and other beta lactams. Susceptibility testing not routinely performed. ABUNDANT ACINETOBACTER CALCOACETICUS/BAUMANNII COMPLEX ABUNDANT STAPHYLOCOCCUS AUREUS NO ANAEROBES ISOLATED Performed at [facility], [address], [place], [place] [address]    Report Status 01/15/2022 FINAL  Final   Organism ID, Bacteria ACINETOBACTER CALCOACETICUS/BAUMANNII COMPLEX  Final   Organism ID, Bacteria STAPHYLOCOCCUS AUREUS  Final      Susceptibility   Acinetobacter calcoaceticus/baumannii complex - MIC*    CEFTAZIDIME 4 SENSITIVE Sensitive     CIPROFLOXACIN <=0.25 SENSITIVE Sensitive     GENTAMICIN <=1 SENSITIVE Sensitive     IMIPENEM <=0.25 SENSITIVE Sensitive     PIP/TAZO <=4 SENSITIVE Sensitive     TRIMETH/SULFA <=20 SENSITIVE Sensitive     AMPICILLIN/SULBACTAM <=2 SENSITIVE  Sensitive     * ABUNDANT ACINETOBACTER CALCOACETICUS/BAUMANNII COMPLEX   Staphylococcus aureus - MIC*    CIPROFLOXACIN <=0.5 SENSITIVE Sensitive     ERYTHROMYCIN <=0.25 SENSITIVE Sensitive     GENTAMICIN <=0.5 SENSITIVE Sensitive     OXACILLIN 0.5 SENSITIVE Sensitive     TETRACYCLINE <=1 SENSITIVE Sensitive     VANCOMYCIN 1 SENSITIVE Sensitive     TRIMETH/SULFA <=10 SENSITIVE Sensitive     CLINDAMYCIN <=0.25 SENSITIVE Sensitive     RIFAMPIN <=0.5 SENSITIVE Sensitive     Inducible Clindamycin NEGATIVE Sensitive     * ABUNDANT STAPHYLOCOCCUS AUREUS  Resp Panel by RT-PCR (Flu A&B, Covid) Anterior Nasal Swab     Status: None   Collection Time: 01/11/22  4:03 PM   Specimen: Anterior Nasal Swab  Result Value Ref Range Status   SARS Coronavirus 2 by RT PCR NEGATIVE NEGATIVE Final    Comment: (NOTE) SARS-CoV-2 target nucleic acids are NOT DETECTED.  The SARS-CoV-2  RNA is generally detectable in upper respiratory specimens during the acute phase of infection. The lowest concentration of SARS-CoV-2 viral copies this assay can detect is 138 copies/mL. A negative result does not preclude SARS-Cov-2 infection and should not be used as the sole basis for treatment or other patient management decisions. A negative result may occur with  improper specimen collection/handling, submission of specimen other than nasopharyngeal swab, presence of viral mutation(s) within the areas targeted by this assay, and inadequate number of viral copies(<138 copies/mL). A negative result must be combined with clinical observations, patient history, and epidemiological information. The expected result is Negative.  Fact Sheet for Patients:  BloggerCourse.com  Fact Sheet for Healthcare Providers:  SeriousBroker.it  This test is no t yet approved or cleared by the [place] FDA and  has been authorized for detection and/or diagnosis of SARS-CoV-2 by FDA  under an Emergency Use Authorization (EUA). This EUA will remain  in effect (meaning this test can be used) for the duration of the COVID-19 declaration under Section 564(b)(1) of the Act, 21 U.S.C.section 360bbb-3(b)(1), unless the authorization is terminated  or revoked sooner.       Influenza A by PCR NEGATIVE NEGATIVE Final   Influenza B by PCR NEGATIVE NEGATIVE Final    Comment: (NOTE) The Xpert Xpress SARS-CoV-2/FLU/RSV plus assay is intended as an aid in the diagnosis of influenza from Nasopharyngeal swab specimens and should not be used as a sole basis for treatment. Nasal washings and aspirates are unacceptable for Xpert Xpress SARS-CoV-2/FLU/RSV testing.  Fact Sheet for Patients: BloggerCourse.com  Fact Sheet for Healthcare Providers: SeriousBroker.it  This test is not yet approved or cleared by the [place] FDA and has been authorized for detection and/or diagnosis of SARS-CoV-2 by FDA under an Emergency Use Authorization (EUA). This EUA will remain in effect (meaning this test can be used) for the duration of the COVID-19 declaration under Section 564(b)(1) of the Act, 21 U.S.C. section 360bbb-3(b)(1), unless the authorization is terminated or revoked.  Performed at [facility], [address], [place], [place] [address]   Respiratory (~20 pathogens) panel by PCR     Status: None   Collection Time: 01/11/22  4:03 PM   Specimen: Nasopharyngeal Swab; Respiratory  Result Value Ref Range Status   Adenovirus NOT DETECTED NOT DETECTED Final   Coronavirus 229E NOT DETECTED NOT DETECTED Final    Comment: (NOTE) The Coronavirus on the Respiratory Panel, DOES NOT test for the novel  Coronavirus (2019 nCoV)    Coronavirus HKU1 NOT DETECTED NOT DETECTED Final   Coronavirus NL63 NOT DETECTED NOT DETECTED Final   Coronavirus OC43 NOT DETECTED NOT DETECTED Final   Metapneumovirus NOT DETECTED NOT  DETECTED Final   Rhinovirus / Enterovirus NOT DETECTED NOT DETECTED Final   Influenza A NOT DETECTED NOT DETECTED Final   Influenza B NOT DETECTED NOT DETECTED Final   Parainfluenza Virus 1 NOT DETECTED NOT DETECTED Final   Parainfluenza Virus 2 NOT DETECTED NOT DETECTED Final   Parainfluenza Virus 3 NOT DETECTED NOT DETECTED Final   Parainfluenza Virus 4 NOT DETECTED NOT DETECTED Final   Respiratory Syncytial Virus NOT DETECTED NOT DETECTED Final   Bordetella pertussis NOT DETECTED NOT DETECTED Final   Bordetella Parapertussis NOT DETECTED NOT DETECTED Final   Chlamydophila pneumoniae NOT DETECTED NOT DETECTED Final   Mycoplasma pneumoniae NOT DETECTED NOT DETECTED Final    Comment: Performed at [facility], [address], [place], [place] [address]  Pertinent Lab.    Latest Ref Rng & Units 07/29/2022    4:16 AM 07/28/2022    4:20 AM 07/27/2022    9:10 AM  CBC  WBC 4.0 - 10.5 K/uL 8.8  8.8  8.1   Hemoglobin 13.0 - 17.0 g/dL 16.1  09.6  04.5   Hematocrit 39.0 - 52.0 % 34.8  36.5  37.9   Platelets 150 - 400 K/uL 288  285  298       Latest Ref Rng & Units 07/29/2022    4:16 AM 07/28/2022    4:20 AM 07/27/2022    3:19 PM  CMP  Glucose 70 - 99 mg/dL 409  99  811   BUN 8 - 23 mg/dL 22  11  13    Creatinine 0.61 - 1.24 mg/dL 9.14  7.82  9.56   Sodium 135 - 145 mmol/L 129  127  128   Potassium 3.5 - 5.1 mmol/L 4.2  3.7  4.0   Chloride 98 - 111 mmol/L 101  99  97   CO2 22 - 32 mmol/L 22  22  23    Calcium 8.9 - 10.3 mg/dL 8.9  8.5  9.0   Total Protein 6.5 - 8.1 g/dL  7.3    Total Bilirubin 0.3 - 1.2 mg/dL  0.4    Alkaline Phos 38 - 126 U/L  62    AST 15 - 41 U/L  19    ALT 0 - 44 U/L  12       Pertinent Imaging today Plain films and CT images have been personally visualized and interpreted; radiology reports have been reviewed. Decision making incorporated into the Impression  No results found.  I have personally spent 37 minutes involved in face-to-face and  non-face-to-face activities for this patient on the day of the visit. Professional time spent includes the following activities: Preparing to see the patient (review of tests), Obtaining and/or reviewing separately obtained history (admission/discharge record), Performing a medically appropriate examination and/or evaluation , Ordering medications/tests/procedures, referring and communicating with other health care professionals, Documenting clinical information in the EMR, Independently interpreting results (not separately reported), Communicating results to the patient/family/caregiver, Counseling and educating the patient/family/caregiver and Care coordination (not separately reported).   Plan d/w requesting provider as well as ID pharm D  Note: This document was prepared using dragon voice recognition software and may include unintentional dictation errors.   Electronically signed by:   Odette Fraction, MD Infectious Disease Physician [facility] Pager: [phone]
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
## 2022-07-30 NOTE — TOC Transition Note (Addendum)
Transition of Care [facility]) - CM/SW Discharge Note   Patient Details  Name: Jimmy [name] MRN: [id number] Date of Birth: [birth year]  Transition [facility] Care [facility] CM/SW Contact:  Larrie Kass, LCSW Phone Number: 07/30/2022, 11:34 AM   Clinical Narrative:     IVC change of Commitment uploaded and Efile with court.CSW met with pt, pt is currently homeless. he reports wanting to live with his brother at [facility]. CSW explained the process for LTC placement. Per chart review medicaid is not listed despite pt stating he has Medicaid in place  CSW informed pt he will have to work with a primary care provider to obtain LTC placement. CSW added [facility] and [facility] to pt's d/c paperwork. Pt stated he will follow up and make an appointment. Pt requested two bus passes. Shelter and social services resources has been added to pt's d/c paperwork. No additional TOC needs TOC sign off.    Patient Goals and CMS Choice      Discharge Placement                         Discharge Plan and Services Additional resources added to the After Visit Summary for                                       Social Determinants of Health (SDOH) Interventions SDOH Screenings   Food Insecurity: Food Insecurity Present (07/29/2022)  Housing: High Risk (07/29/2022)  Transportation Needs: Unmet Transportation Needs (07/29/2022)  Utilities: At Risk (07/29/2022)  Tobacco Use: High Risk (07/27/2022)     Readmission Risk Interventions     No data to display

## 2022-07-30 NOTE — Discharge Summary (Signed)
Physician Discharge Summary  Jimmy [name] ZOX:[id number] DOB: 07-Jul-1955 DOA: 07/26/2022  PCP: System, Provider Not In  Admit date: 07/26/2022 Discharge date: 07/30/2022  Admitted From: Home Disposition:  Home  Recommendations for Outpatient Follow-up:  Follow up with PCP in 1-2 weeks Please obtain BMP/CBC in one week your next doctors visit.  permethrin 5% to be applied in alternate days for 2 weeks Oral ivermectin 200 mcg/kg for total 7 days and nonconsecutive days-day 1, 2, 8, 9, 15, 22, 29 Oral Duricef to complete total 2-week course   Discharge Condition: Stable CODE STATUS: Full code Diet recommendation: Regular  Brief/Interim Summary: 67 y.o. male with medical history significant for chronic alcohol abuse, chronic hyponatremia with baseline serum sodium 1 25-1 32, type I bipolar disorder, who is admitted to [facility] on 07/26/2022 with acute on chronic hyponatremia after presenting from home to [facility] ED complaining of suicidal ideations.  Found to have hyponatremia.  Patient was also found to have scabies started on ivermectin and permethrin topical.  Eventually patient cleared by psychiatry to be discharged.  Discharge medication recommendations as above.     Assessment & Plan:  Principal Problem:   Acute hyponatremia Active Problems:   Chronic alcohol abuse   Tobacco abuse   Suicidal ideations   Leukocytosis   Suicidal ideation      Acute Hyponatremia; resolved.  Admission Na 119. Now at baseline 129. Likely beer potomania.  Urine osmolality 179.  Urine sodium 91   Suicidal Ideation  Mood Disorder Seen by psychiatry.  Initially recommended inpatient psych but doing better therefore okay for discharge   Low Normal B12 B12 supplementation      Etoh Abuse On alcohol withdrawal protocol.  Oral thiamine upon discharge   Pruritus  Hx Scabies  Concern for Crusted Scabies Right Hand Cellulitis Seen by infectious disease, appreciate their recommendations.   Permethrin topical and ivermectin recommendations as mentioned above - Continue IV Ancef which can be transition to [facility]; anticipate total 7-day treatment     Eosinophilia; chronic Ustable.    Tobacco Abuse Encourage cessation   Subclinical Hypothyroidism Follow outpatient        Discharge Diagnoses:  Principal Problem:   Acute hyponatremia Active Problems:   Chronic alcohol abuse   Tobacco abuse   Suicidal ideations   Leukocytosis   Suicidal ideation      Consultations: Infectious disease Psychiatry  Subjective: Patient is doing well no complaints.  Wants to go home.  Denies any suicidal or homicidal ideation. Seen patient with the psychiatry NP  Discharge Exam: Vitals:   07/29/22 2149 07/30/22 0609  BP: 137/77 (!) 145/79  Pulse: 88 75  Resp: 17 16  Temp: 98 F (36.7 C) 98.2 F (36.8 C)  SpO2: 97% 98%   Vitals:   07/29/22 1700 07/29/22 2149 07/30/22 0500 07/30/22 0609  BP: 120/64 137/77  (!) 145/79  Pulse: 85 88  75  Resp:  17  16  Temp:  98 F (36.7 C)  98.2 F (36.8 C)  TempSrc:      SpO2:  97%  98%  Weight:   80.5 kg   Height:        General: Pt is alert, awake, not in acute distress Cardiovascular: RRR, S1/S2 +, no rubs, no gallops Respiratory: CTA bilaterally, no wheezing, no rhonchi Abdominal: Soft, NT, ND, bowel sounds + Extremities: no edema, no cyanosis  Discharge Instructions   Allergies as of 07/30/2022       Reactions   Neosporin [  neomycin-bacitracin Zn-polymyx] Rash        Medication List     STOP taking these medications    acetaminophen 325 MG tablet Commonly known as: Tylenol   cyanocobalamin 1000 MCG tablet   diphenhydrAMINE 25 mg capsule Commonly known as: BENADRYL   iron polysaccharides 150 MG capsule Commonly known as: NIFEREX   multivitamin with minerals Tabs tablet   mupirocin ointment 2 % Commonly known as: BACTROBAN   nicotine 21 mg/24hr patch Commonly known as: NICODERM CQ - dosed in  mg/24 hours   ondansetron 4 MG tablet Commonly known as: ZOFRAN   Silvadene 1 % cream Generic drug: silver sulfADIAZINE       TAKE these medications    ARIPiprazole 5 MG tablet Commonly known as: ABILIFY Take 1 tablet (5 mg total) by mouth daily. Start taking on: Jul 31, 2022   cefadroxil 500 MG capsule Commonly known as: DURICEF Take 1 capsule (500 mg total) by mouth 2 (two) times daily for 4 days.   folic acid 1 MG tablet Commonly known as: FOLVITE Take 1 tablet (1 mg total) by mouth daily. Start taking on: Jul 31, 2022   ivermectin 3 MG Tabs tablet Commonly known as: STROMECTOL Take 5 tablets (15,000 mcg total) by mouth daily for 2 days, THEN 5 tablets (15,000 mcg total) once a week for 21 days. Take daily for 2 days on 5/13 + 5/14, then weekly for 3 doses on 5/20 + 5/27 + 6/3. Start taking on: Aug 04, 2022   permethrin 5 % cream Commonly known as: ELIMITE Apply topically every other day for 11 days. Start taking on: Jul 31, 2022   thiamine 100 MG tablet Commonly known as: Vitamin B-1 Take 1 tablet (100 mg total) by mouth daily. Start taking on: Aug 05, 2022 What changed: These instructions start on Aug 05, 2022. If you are unsure what to do until then, ask your doctor or other care provider.        Follow-up Information      [facility]. Schedule an appointment as soon as possible for a visit.   Contact information: [address] [place] [address] [phone]               Allergies  Allergen Reactions   Neosporin [Neomycin-Bacitracin Zn-Polymyx] Rash    You were cared for by a hospitalist during your hospital stay. If you have any questions about your discharge medications or the care you received while you were in the hospital after you are discharged, you can call the unit and asked to speak with the hospitalist on call if the hospitalist that took care of you is not available. Once  you are discharged, your primary care physician will handle any further medical issues. Please note that no refills for any discharge medications will be authorized once you are discharged, as it is imperative that you return to your primary care physician (or establish a relationship with a primary care physician if you do not have one) for your aftercare needs so that they can reassess your need for medications and monitor your lab values.  You were cared for by a hospitalist during your hospital stay. If you have any questions about your discharge medications or the care you received while you were in the hospital after you are discharged, you can call the unit and asked to speak with the hospitalist on call if the hospitalist that took care of you is  not available. Once you are discharged, your primary care physician will handle any further medical issues. Please note that NO REFILLS for any discharge medications will be authorized once you are discharged, as it is imperative that you return to your primary care physician (or establish a relationship with a primary care physician if you do not have one) for your aftercare needs so that they can reassess your need for medications and monitor your lab values.  Please request your Prim.MD to go over all Hospital Tests and Procedure/Radiological results at the follow up, please get all Hospital records sent to your Prim MD by signing hospital release before you go home.  Get CBC, CMP, 2 view Chest X ray checked  by Primary MD during your next visit or SNF MD in 5-7 days ( we routinely change or add medications that can affect your baseline labs and fluid status, therefore we recommend that you get the mentioned basic workup next visit with your PCP, your PCP may decide not to get them or add new tests based on their clinical decision)  On your next visit with your primary care physician please Get Medicines reviewed and adjusted.  If you experience  worsening of your admission symptoms, develop shortness of breath, life threatening emergency, suicidal or homicidal thoughts you must seek medical attention immediately by calling 911 or calling your MD immediately  if symptoms less severe.  You Must read complete instructions/literature along with all the possible adverse reactions/side effects for all the Medicines you take and that have been prescribed to you. Take any new Medicines after you have completely understood and accpet all the possible adverse reactions/side effects.   Do not drive, operate heavy machinery, perform activities at heights, swimming or participation in water activities or provide baby sitting services if your were admitted for syncope or siezures until you have seen by Primary MD or a Neurologist and advised to do so again.  Do not drive when taking Pain medications.   Procedures/Studies: DG Chest Port 1 View  Result Date: 07/27/2022 CLINICAL DATA:  [address] with acute hyponatremia. EXAM: PORTABLE CHEST 1 VIEW COMPARISON:  Portable 01/11/2022 FINDINGS: The heart size and mediastinal contours are within normal limits. There is calcific plaque of the transverse aorta. Both lungs are clear. The visualized skeletal structures are unremarkable apart from thoracic spondylosis. IMPRESSION: No active disease. Aortic atherosclerosis. Electronically Signed   By: Almira Bar M.D.   On: 07/27/2022 07:28   DG Hand Complete Right  Result Date: 07/26/2022 CLINICAL DATA:  Right hand swelling EXAM: RIGHT HAND - COMPLETE 3 VIEW COMPARISON:  None Available. FINDINGS: There is no evidence of fracture or dislocation. Moderate degenerative changes of the triscaphe joint . Soft tissues are unremarkable. IMPRESSION: No acute osseous abnormality. Electronically Signed   By: Allegra Lai M.D.   On: 07/26/2022 18:56   DG Foot 2 Views Right  Result Date: 07/06/2022 CLINICAL DATA:  Plantar foot wound. EXAM: RIGHT FOOT - 2 VIEW COMPARISON:   Right foot x-rays dated January 10, 2022. FINDINGS: No bone destruction or periosteal reaction. There is no evidence of fracture or dislocation. Unchanged moderate first MTP joint osteoarthritis. Superficial soft tissue irregularity of the plantar arch. IMPRESSION: 1. Superficial soft tissue irregularity of the plantar arch. No acute osseous abnormality. 2. Unchanged moderate first MTP joint osteoarthritis. Electronically Signed   By: Obie Dredge M.D.   On: 07/06/2022 14:07   DG Foot 2 Views Left  Result Date: 07/06/2022 CLINICAL DATA:  Plantar  foot wound. EXAM: LEFT FOOT - 2 VIEW COMPARISON:  Left foot x-rays dated May 16, 2022. FINDINGS: No bony destruction or periosteal reaction. There is no evidence of fracture or dislocation. Unchanged severe first MTP joint osteoarthritis. Soft tissues are unremarkable. IMPRESSION: 1.  No acute osseous abnormality. 2. Unchanged severe first MTP joint osteoarthritis. Electronically Signed   By: Obie Dredge M.D.   On: 07/06/2022 14:06     The results of significant diagnostics from this hospitalization (including imaging, microbiology, ancillary and laboratory) are listed below for reference.     Microbiology: No results found for this or any previous visit (from the past 240 hour(s)).   Labs: BNP (last 3 results) Recent Labs    07/27/22 0910  BNP 8.5   Basic Metabolic Panel: Recent Labs  Lab 07/27/22 0910 07/27/22 1519 07/28/22 0420 07/29/22 0416 07/30/22 0830  NA 127* 128* 127* 129* 130*  K 4.1 4.0 3.7 4.2 4.3  CL 97* 97* 99 101 99  CO2 22 23 22 22 24   GLUCOSE 173* 106* 99 101* 100*  BUN 12 13 11 22 14   CREATININE 0.67 0.75 0.80 0.85 0.72  CALCIUM 8.7* 9.0 8.5* 8.9 8.9  MG 1.8  --  1.8  --  1.7  PHOS  --   --  4.3  --  3.8   Liver Function Tests: Recent Labs  Lab 07/26/22 1921 07/28/22 0420  AST 28 19  ALT 15 12  ALKPHOS 81 62  BILITOT 0.9 0.4  PROT 8.8* 7.3  ALBUMIN 4.4 3.6   No results for input(s): "LIPASE",  "AMYLASE" in the last 168 hours. No results for input(s): "AMMONIA" in the last 168 hours. CBC: Recent Labs  Lab 07/26/22 1921 07/27/22 0910 07/28/22 0420 07/29/22 0416 07/30/22 0830  WBC 11.8* 8.1 8.8 8.8 7.6  NEUTROABS 7.3 4.1 4.3  --   --   HGB 13.8 12.8* 12.7* 11.8* 12.6*  HCT 40.0 37.9* 36.5* 34.8* 37.7*  MCV 95.0 95.5 95.8 96.9 97.9  PLT 420* 298 285 288 324   Cardiac Enzymes: No results for input(s): "CKTOTAL", "CKMB", "CKMBINDEX", "TROPONINI" in the last 168 hours. BNP: Invalid input(s): "POCBNP" CBG: No results for input(s): "GLUCAP" in the last 168 hours. D-Dimer No results for input(s): "DDIMER" in the last 72 hours. Hgb A1c No results for input(s): "HGBA1C" in the last 72 hours. Lipid Profile No results for input(s): "CHOL", "HDL", "LDLCALC", "TRIG", "CHOLHDL", "LDLDIRECT" in the last 72 hours. Thyroid function studies No results for input(s): "TSH", "T4TOTAL", "T3FREE", "THYROIDAB" in the last 72 hours.  Invalid input(s): "FREET3" Anemia work up Recent Labs    07/28/22 0420  VITAMINB12 201   Urinalysis    Component Value Date/Time   COLORURINE STRAW (A) 07/26/2022 2105   APPEARANCEUR CLEAR 07/26/2022 2105   LABSPEC 1.004 (L) 07/26/2022 2105   PHURINE 5.0 07/26/2022 2105   GLUCOSEU NEGATIVE 07/26/2022 2105   HGBUR SMALL (A) 07/26/2022 2105   BILIRUBINUR NEGATIVE 07/26/2022 2105   KETONESUR NEGATIVE 07/26/2022 2105   PROTEINUR NEGATIVE 07/26/2022 2105   UROBILINOGEN 1.0 08/04/2013 0427   NITRITE NEGATIVE 07/26/2022 2105   LEUKOCYTESUR NEGATIVE 07/26/2022 2105   Sepsis Labs Recent Labs  Lab 07/27/22 0910 07/28/22 0420 07/29/22 0416 07/30/22 0830  WBC 8.1 8.8 8.8 7.6   Microbiology No results found for this or any previous visit (from the past 240 hour(s)).   Time coordinating discharge:  I have spent 35 minutes face to face with the patient and on the ward discussing  the patients care, assessment, plan and disposition with other care  givers. >50% of the time was devoted counseling the patient about the risks and benefits of treatment/Discharge disposition and coordinating care.   SIGNED:   Dimple Nanas, MD  [facility] 07/30/2022, 1:42 PM   If 7PM-7AM, please contact night-coverage

## 2022-07-31 LAB — METHYLMALONIC ACID, SERUM: Methylmalonic Acid, Quantitative: 267 nmol/L (ref 0–378)

## 2022-08-04 ENCOUNTER — Emergency Department (HOSPITAL_COMMUNITY)
Admission: EM | Admit: 2022-08-04 | Discharge: 2022-08-06 | Disposition: A | Discharge status: Other Acute Inpatient Hospital | Payer: Medicare PPO | Attending: Emergency Medicine | Admitting: Emergency Medicine

## 2022-08-04 DIAGNOSIS — S61511A Laceration without foreign body of right wrist, initial encounter: Secondary | ICD-10-CM | POA: Insufficient documentation

## 2022-08-04 DIAGNOSIS — Y900 Blood alcohol level of less than 20 mg/100 ml: Secondary | ICD-10-CM | POA: Insufficient documentation

## 2022-08-04 DIAGNOSIS — R45851 Suicidal ideations: Secondary | ICD-10-CM | POA: Insufficient documentation

## 2022-08-04 DIAGNOSIS — F259 Schizoaffective disorder, unspecified: Secondary | ICD-10-CM | POA: Diagnosis not present

## 2022-08-04 DIAGNOSIS — F1090 Alcohol use, unspecified, uncomplicated: Secondary | ICD-10-CM | POA: Insufficient documentation

## 2022-08-04 DIAGNOSIS — T1491XA Suicide attempt, initial encounter: Secondary | ICD-10-CM

## 2022-08-04 LAB — CBC WITH DIFFERENTIAL/PLATELET
Abs Immature Granulocytes: 0.06 10*3/uL (ref 0.00–0.07)
Basophils Absolute: 0.1 10*3/uL (ref 0.0–0.1)
Basophils Relative: 2 %
Eosinophils Absolute: 1.9 10*3/uL — ABNORMAL HIGH (ref 0.0–0.5)
Eosinophils Relative: 26 %
HCT: 36.9 % — ABNORMAL LOW (ref 39.0–52.0)
Hemoglobin: 12.9 g/dL — ABNORMAL LOW (ref 13.0–17.0)
Immature Granulocytes: 1 %
Lymphocytes Relative: 29 %
Lymphs Abs: 2.2 10*3/uL (ref 0.7–4.0)
MCH: 33 pg (ref 26.0–34.0)
MCHC: 35 g/dL (ref 30.0–36.0)
MCV: 94.4 fL (ref 80.0–100.0)
Monocytes Absolute: 0.8 10*3/uL (ref 0.1–1.0)
Monocytes Relative: 11 %
Neutro Abs: 2.3 10*3/uL (ref 1.7–7.7)
Neutrophils Relative %: 31 %
Platelets: 377 10*3/uL (ref 150–400)
RBC: 3.91 MIL/uL — ABNORMAL LOW (ref 4.22–5.81)
RDW: 11.9 % (ref 11.5–15.5)
WBC: 7.3 10*3/uL (ref 4.0–10.5)
nRBC: 0 % (ref 0.0–0.2)

## 2022-08-04 LAB — COMPREHENSIVE METABOLIC PANEL
ALT: 13 U/L (ref 0–44)
AST: 20 U/L (ref 15–41)
Albumin: 3.7 g/dL (ref 3.5–5.0)
Alkaline Phosphatase: 59 U/L (ref 38–126)
Anion gap: 9 (ref 5–15)
BUN: 9 mg/dL (ref 8–23)
CO2: 22 mmol/L (ref 22–32)
Calcium: 9.3 mg/dL (ref 8.9–10.3)
Chloride: 97 mmol/L — ABNORMAL LOW (ref 98–111)
Creatinine, Ser: 0.71 mg/dL (ref 0.61–1.24)
GFR, Estimated: >60 mL/min (ref >60)
Glucose, Bld: 104 mg/dL — ABNORMAL HIGH (ref 70–99)
Potassium: 3.8 mmol/L (ref 3.5–5.1)
Sodium: 128 mmol/L — ABNORMAL LOW (ref 135–145)
Total Bilirubin: 0.2 mg/dL — ABNORMAL LOW (ref 0.3–1.2)
Total Protein: 8 g/dL (ref 6.5–8.1)

## 2022-08-04 LAB — ETHANOL: Alcohol, Ethyl (B): <10 mg/dL (ref <10)

## 2022-08-04 MED ORDER — ARIPIPRAZOLE 5 MG PO TABS
5.0000 mg | ORAL_TABLET | Freq: Every day | ORAL | Status: DC
  Administered 2022-08-04 – 2022-08-06 (×3): 5 mg via ORAL
  Filled 2022-08-04 (×3): qty 1

## 2022-08-04 MED ORDER — THIAMINE MONONITRATE 100 MG PO TABS
100.0000 mg | ORAL_TABLET | Freq: Every day | ORAL | Status: DC
  Administered 2022-08-04 – 2022-08-06 (×3): 100 mg via ORAL
  Filled 2022-08-04 (×3): qty 1

## 2022-08-04 MED ORDER — FOLIC ACID 1 MG PO TABS
1.0000 mg | ORAL_TABLET | Freq: Every day | ORAL | Status: DC
  Administered 2022-08-04 – 2022-08-06 (×3): 1 mg via ORAL
  Filled 2022-08-04 (×3): qty 1

## 2022-08-04 NOTE — ED Notes (Signed)
Gave pt [place] sandwich and cola

## 2022-08-04 NOTE — ED Provider Notes (Signed)
[place] [facility] AT [facility] Provider Note   CSN: [id number] Arrival date & time: 08/04/22  1904     History {Add pertinent medical, surgical, social history, OB history to HPI:1} No chief complaint on file.   Jimmy [name] is a 67 y.o. male.  Patient states that he wants to hurt himself.  He cut his right wrist superficially.   Altered Mental Status Presenting symptoms: behavior changes   Severity:  Severe Most recent episode:  Today Episode history:  Continuous Timing:  Constant Progression:  Worsening Chronicity:  New Context: alcohol use   Associated symptoms: no abdominal pain, no hallucinations, no headaches, no rash and no seizures        Home Medications Prior to Admission medications   Medication Sig Start Date End Date Taking? Authorizing Provider  ARIPiprazole (ABILIFY) 5 MG tablet Take 1 tablet (5 mg total) by mouth daily. Patient not taking: Reported on 08/04/2022 07/31/22   Dimple Nanas, MD  folic acid (FOLVITE) 1 MG tablet Take 1 tablet (1 mg total) by mouth daily. Patient not taking: Reported on 08/04/2022 07/31/22   Dimple Nanas, MD  thiamine (VITAMIN B-1) 100 MG tablet Take 1 tablet (100 mg total) by mouth daily. Patient not taking: Reported on 08/04/2022 08/05/22   Dimple Nanas, MD  famotidine (PEPCID) 20 MG tablet Take 1 tablet (20 mg total) by mouth 2 (two) times daily. 10/13/18 10/19/18  Arby Barrette, MD      Allergies    Neosporin [neomycin-bacitracin zn-polymyx]    Review of Systems   Review of Systems  Constitutional:  Negative for appetite change and fatigue.  HENT:  Negative for congestion, ear discharge and sinus pressure.   Eyes:  Negative for discharge.  Respiratory:  Negative for cough.   Cardiovascular:  Negative for chest pain.  Gastrointestinal:  Negative for abdominal pain and diarrhea.  Genitourinary:  Negative for frequency and hematuria.  Musculoskeletal:  Negative for back pain.   Skin:  Negative for rash.  Neurological:  Negative for seizures and headaches.  Psychiatric/Behavioral:  Positive for dysphoric mood. Negative for hallucinations.     Physical Exam Updated Vital Signs BP (!) 152/86 (BP Location: Right Arm)   Pulse 87   Temp 98 F (36.7 C) (Oral)   Resp 16   Ht 5\' 8"  (1.727 m)   Wt 80.5 kg   SpO2 98%   BMI 26.98 kg/m  Physical Exam Vitals and nursing note reviewed.  Constitutional:      Appearance: He is well-developed.  HENT:     Head: Normocephalic.     Nose: Nose normal.  Eyes:     General: No scleral icterus.    Conjunctiva/sclera: Conjunctivae normal.  Neck:     Thyroid: No thyromegaly.  Cardiovascular:     Rate and Rhythm: Normal rate and regular rhythm.     Heart sounds: No murmur heard.    No friction rub. No gallop.  Pulmonary:     Breath sounds: No stridor. No wheezing or rales.  Chest:     Chest wall: No tenderness.  Abdominal:     General: There is no distension.     Tenderness: There is no abdominal tenderness. There is no rebound.  Musculoskeletal:        General: Normal range of motion.     Cervical back: Neck supple.  Lymphadenopathy:     Cervical: No cervical adenopathy.  Skin:    Findings: No erythema or rash.  Neurological:     Mental Status: He is alert and oriented to person, place, and time.     Motor: No abnormal muscle tone.     Coordination: Coordination normal.  Psychiatric:     Comments: Suicidal ideations.     ED Results / Procedures / Treatments   Labs (all labs ordered are listed, but only abnormal results are displayed) Labs Reviewed  CBC WITH DIFFERENTIAL/PLATELET - Abnormal; Notable for the following components:      Result Value   RBC 3.91 (*)    Hemoglobin 12.9 (*)    HCT 36.9 (*)    Eosinophils Absolute 1.9 (*)    All other components within normal limits  COMPREHENSIVE METABOLIC PANEL - Abnormal; Notable for the following components:   Sodium 128 (*)    Chloride 97 (*)     Glucose, Bld 104 (*)    Total Bilirubin 0.2 (*)    All other components within normal limits  ETHANOL  RAPID URINE DRUG SCREEN, HOSP PERFORMED    EKG None  Radiology No results found.  Procedures Procedures  {Document cardiac monitor, telemetry assessment procedure when appropriate:1}  Medications Ordered in ED Medications  ARIPiprazole (ABILIFY) tablet 5 mg (5 mg Oral Given 08/04/22 2200)  folic acid (FOLVITE) tablet 1 mg (1 mg Oral Given 08/04/22 2200)  thiamine (VITAMIN B1) tablet 100 mg (100 mg Oral Given 08/04/22 2200)    ED Course/ Medical Decision Making/ A&P   {Patient is medically cleared and awaiting behavioral health disposition Click here for ABCD2, HEART and other calculatorsREFRESH Note before signing :1}                          Medical Decision Making Amount and/or Complexity of Data Reviewed Labs: ordered.  Risk OTC drugs. Prescription drug management.   Patient with suicidal ideations.  He will be seen by behavioral health  {Document critical care time when appropriate:1} {Document review of labs and clinical decision tools ie heart score, Chads2Vasc2 etc:1}  {Document your independent review of radiology images, and any outside records:1} {Document your discussion with family members, caretakers, and with consultants:1} {Document social determinants of health affecting pt's care:1} {Document your decision making why or why not admission, treatments were needed:1} Final Clinical Impression(s) / ED Diagnoses Final diagnoses:  None    Rx / DC Orders ED Discharge Orders     None
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
## 2022-08-05 LAB — RAPID URINE DRUG SCREEN, HOSP PERFORMED
Amphetamines: NOT DETECTED
Barbiturates: NOT DETECTED
Benzodiazepines: NOT DETECTED
Cocaine: NOT DETECTED
Opiates: NOT DETECTED
Tetrahydrocannabinol: NOT DETECTED

## 2022-08-05 NOTE — Progress Notes (Signed)
LCSW Progress Note  [id number]   [name]  08/05/2022  12:52 PM  Description:   Inpatient Psychiatric Referral  Patient was recommended inpatient per Dahlia Byes, NP. There are no available beds at [facility] or [facility] unit, per [facility] Rona Ravens, RN. Patient was referred to the following out of network facilities:   [facility] Provider Address Phone Fax  [facility]  [address] [place] [address] [phone]  [facility]  [address] [place] [address] [phone]  [facility] [address]  [address] [place] [address] [phone]  [facility]  [address] [place], [place] [address] [phone]  [facility]  [address] [place] [address] [phone]  [facility]  [address] [place], [place] [address] [phone]  [facility]  [address] [place] [address] [phone]  [facility]  [address], [place] [address] [phone]  [facility]  [address] [place] [address] [phone]  [facility]  [address] [place] [address] [phone]  [facility]  [address] [place] [address] [phone]  [facility]  [address] [place] [address] [phone]  [facility]  [address] [place] [address] [phone]  [facility]  [address] [place] [address] [phone]  [facility]  [address], [place] [address] [phone]  [facility]  [address] [place]  [address] [phone]  [facility]  [address], [place] [address] [phone]  [facility]  [address] [place], [place] [address] [phone]  [facility]  [address] [place] [address] [phone]  [facility]  [address] [place] [address] [phone]  [facility]  [address] [place] [address] [phone]  [facility]  [address], [place] [address] [phone]  [facility]  [address], [place] [address] [phone]  [facility]  [address] [place] [address] [phone]    Situation ongoing, CSW to continue following and update chart as more information becomes available.      Asencion Islam  08/05/2022 12:52 PM

## 2022-08-05 NOTE — ED Notes (Signed)
Pt is sleeping with no signs of distress or discomfort respirations appear easy and skin color is appropriate for pt's ethnicity.

## 2022-08-05 NOTE — Progress Notes (Signed)
Pt was accepted to [facility] 08/06/22;Bed Assignment [place] unit   Pt meets inpatient criteria per Dahlia Byes, NP.   Attending Physician will be Dr. Lolita Patella, MD  Report can be called to: [phone]  Pt can arrive after: 9:00am  Care Team notified: Lum Babe, RN, Dahlia Byes, NP, and [address], LCSWA   [place], [name] 08/05/2022 @ 5:09 PM

## 2022-08-05 NOTE — ED Provider Notes (Addendum)
Emergency Medicine Observation Re-evaluation Note  Jimmy [name] is a 67 y.o. male, seen on rounds today.  Pt initially presented to the ED for complaints of No chief complaint on file. Currently, the patient is asleep.  Physical Exam  BP (!) 152/86 (BP Location: Right Arm)   Pulse 87   Temp 98 F (36.7 C) (Oral)   Resp 16   Ht 5\' 8"  (1.727 m)   Wt 80.5 kg   SpO2 98%   BMI 26.98 kg/m  Physical Exam General: asleep Cardiac: asleep Lungs: asleep Psych: asleep  ED Course / MDM  EKG:   I have reviewed the labs performed to date as well as medications administered while in observation.  Recent changes in the last 24 hours include home medications.  Plan  Current plan is for psychiatric admission.    Pricilla Loveless, MD 08/05/22 [phone]

## 2022-08-05 NOTE — ED Notes (Addendum)
Patient endorsed suicidal ideation with a plan and intent. No homicidal ideation noted. Patient denied hallucinations.

## 2022-08-05 NOTE — Consult Note (Signed)
Patient has been accepted to [facility] morning.  He remains suicidal with no plan.  He says he has been feeling hopeless due to being homeless.  Patient admits he has not been on Medication for Schizophrenia, Bipolar disorder in years.  He has not seen a mental health provider in years.  Patient also admits to Alcohol use-drinks 6 bottles everyday. And occasionally uses Cocaine.  He denies HI/AVH.  Transportation will be arranged in the morning for his trip the  hospital.

## 2022-08-05 NOTE — ED Notes (Signed)
Patient alert and oriented this shift. Patient cooperative. Patient sleeping at times.  Patient medication compliant. Patient can ambulate and do ADLs.

## 2022-08-05 NOTE — ED Notes (Signed)
Patient alert. Patient calm and cooperative.  Does not seem to be responding to internal stimuli at this time.

## 2022-08-06 NOTE — ED Notes (Signed)
Patient discharged off unit to [facility] per provider. Patient alert, calm, cooperative, no s/s of distress at this time.  Patient discharge information and belongings given too Tree surgeon for facility. Patient ambulatory off unit , escorted by  NT. Patient transported by [employer].

## 2022-08-06 NOTE — ED Provider Notes (Signed)
Emergency Medicine Observation Re-evaluation Note  Jimmy [name] is a 67 y.o. male, seen on rounds today.  Pt initially presented to the ED for complaints of No chief complaint on file. Currently, the patient is resting.  Physical Exam  BP 128/67 (BP Location: Left Arm)   Pulse 73   Temp (!) 97.5 F (36.4 C) (Oral)   Resp 18   Ht 5\' 8"  (1.727 m)   Wt 80.5 kg   SpO2 99%   BMI 26.98 kg/m  Physical Exam General: resting comfortably, NAD Lungs: normal WOB Psych: currently calm and resting  ED Course / MDM  EKG:   I have reviewed the labs performed to date as well as medications administered while in observation.  Recent changes in the last 24 hours include excepted to [facility]  Plan  Current plan is for transfer to [facility].  Patient is stable at time of transfer.  EMTALA completed.    Rozelle Logan, [place] 08/06/22 [id number]

## 2022-08-16 IMAGING — XA IR ANGIO/VISCERAL SELECTIVE EA VESSEL WO/W FLUSH
5 series · 14 of 23 positions shown · IV contrast (IODINE)
Comparison: none

INDICATION: Emergent angio EMBO for bleeding pyloric channel ulcer at endoscopy

[Series 1: body 4 care · 2 acquisitions, 3 frames shown (1 of 4)]
[im 1/2]
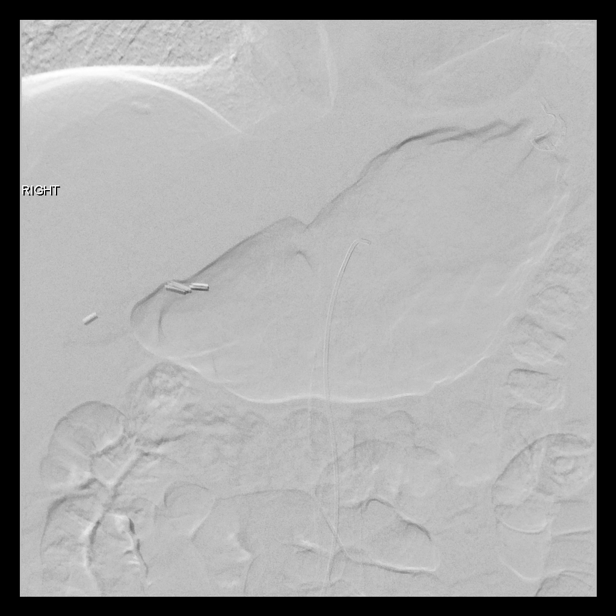
[im 1/2]
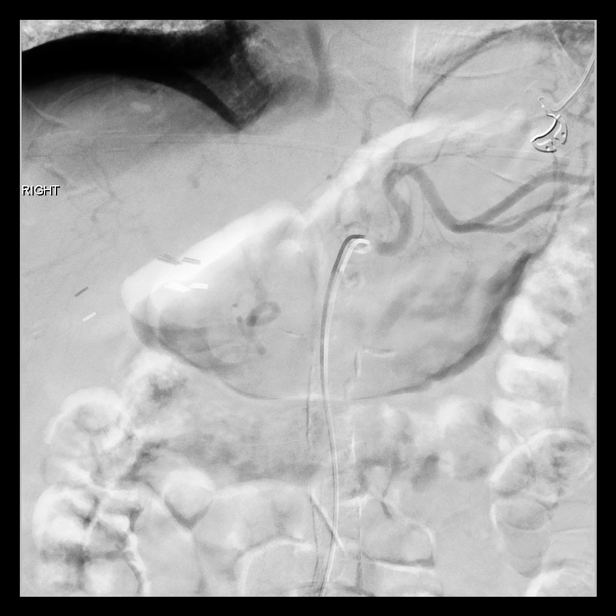
[im 2/2]
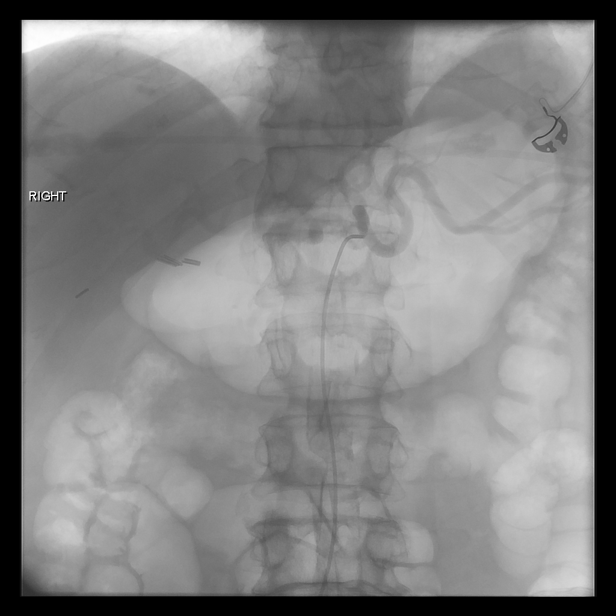

[Series 2: body 4 care · 2 acquisitions, 3 frames shown (2 of 4)]
[im 1/2]
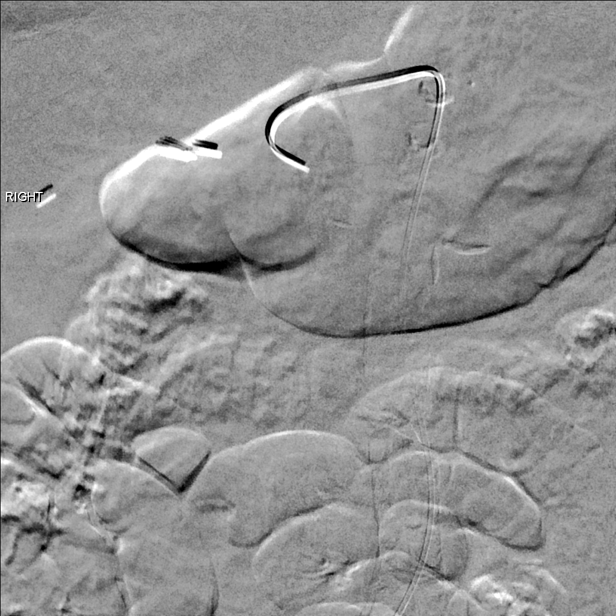
[im 1/2]
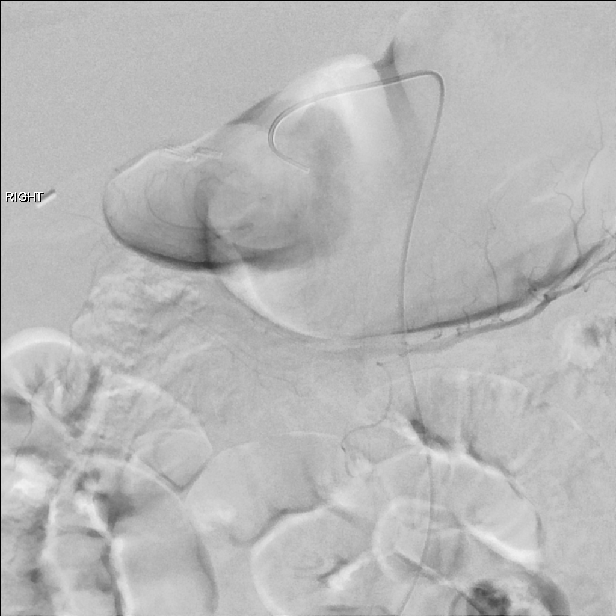
[im 2/2]
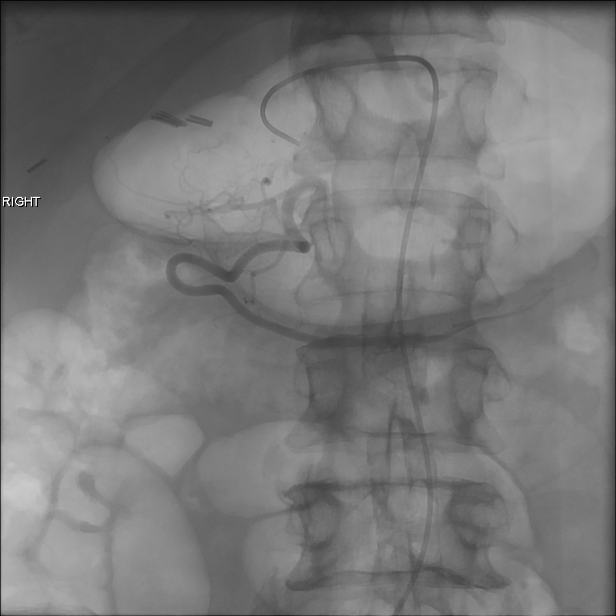

[Series 3: body 4 care · 2 acquisitions, 3 frames shown (3 of 4)]
[im 1/2]
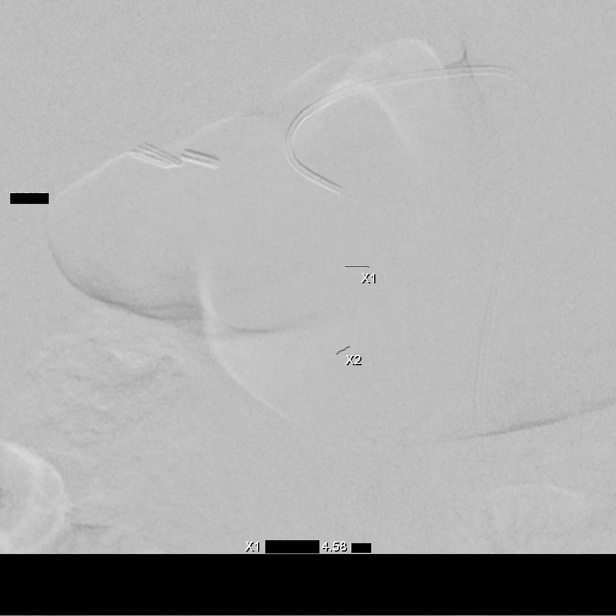
[im 1/2]
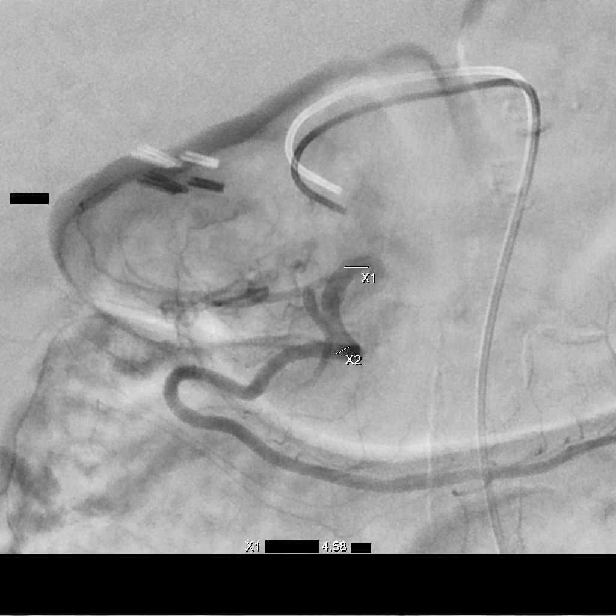
[im 1/2]
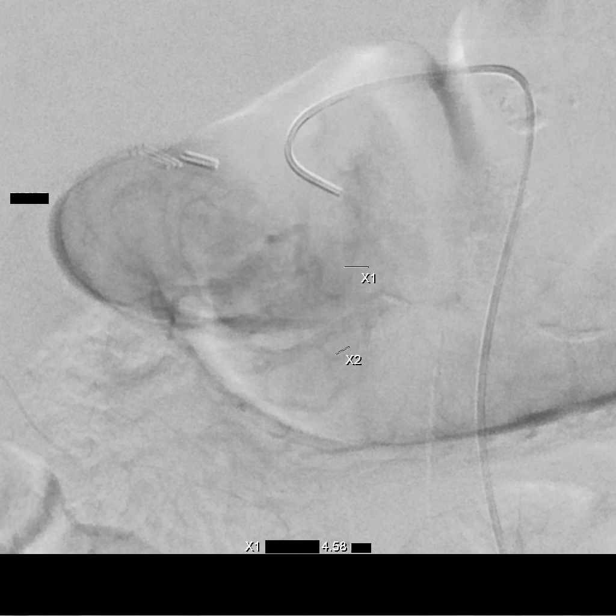

[Series 11: fl (-) angio · 3 of 43 frames shown]
[frame 7/43]
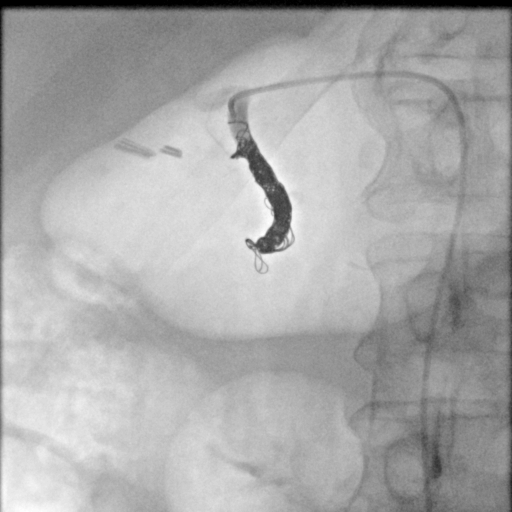
[frame 22/43]
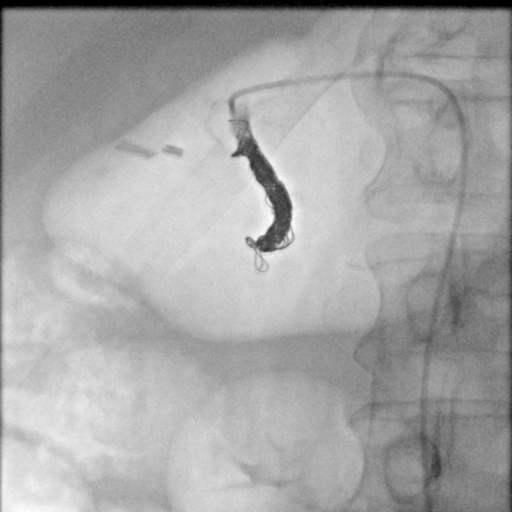
[frame 37/43]
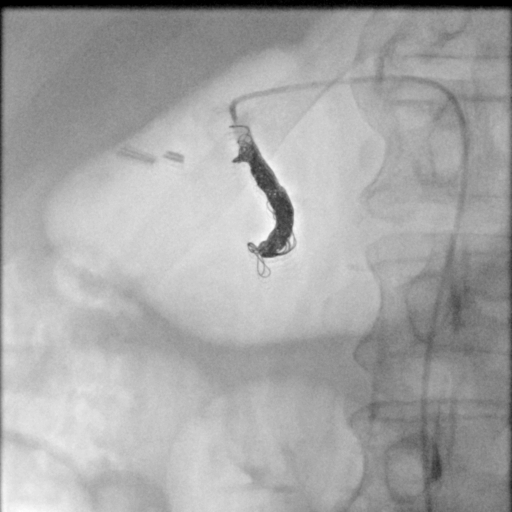

[Series 12: body 4 care · 2 of 30 frames shown (4 of 4)]
[frame 16/30]
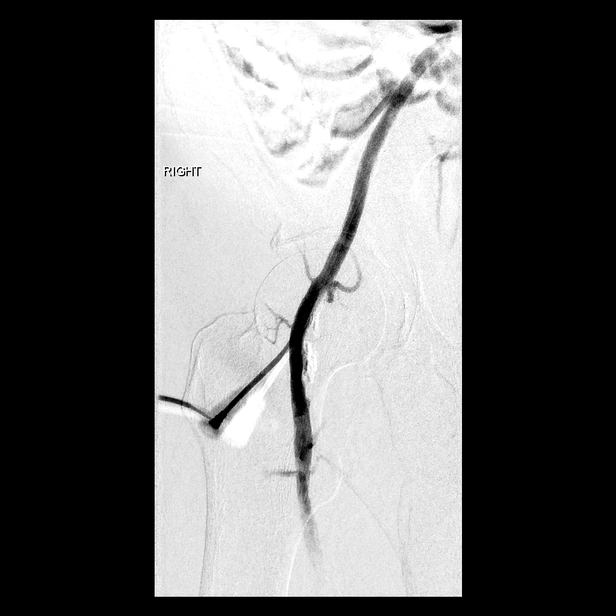
[frame 26/30]
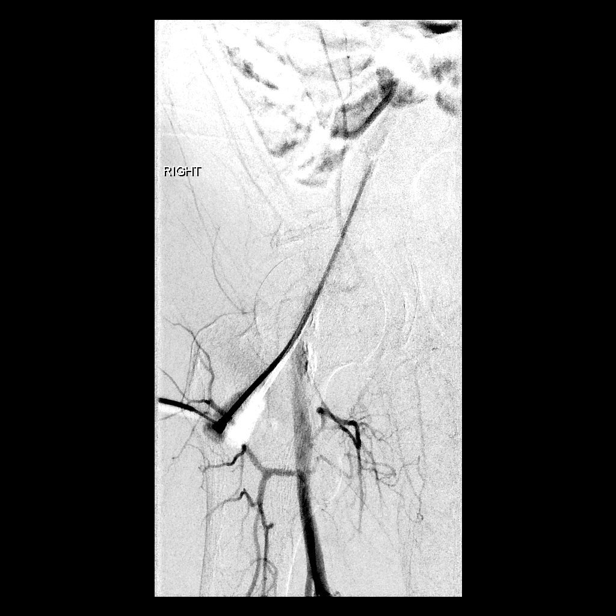

[14 of 23 positions shown; findings below may reference images not displayed]

EXAM:
ULTRASOUND GUIDANCE FOR VASCULAR ACCESS

CELIAC AND GDA CATHETERIZATIONS AND ANGIOGRAMS

GDA MICRO COIL EMBOLIZATION

MEDICATIONS:
Anesthesia.

ANESTHESIA/SEDATION:
General anesthesia

CONTRAST:  75 cc omni 300

FLUOROSCOPY TIME:  Fluoroscopy Time: 8 minutes 24 seconds (127 mGy).

COMPLICATIONS:
None immediate.

PROCEDURE:
Patient is unable to give consent and no available family members.
Because of the active acute arterial bleeding pyloric channel ulcer,
emergent consent is necessary. A time out was performed prior to the
initiation of the procedure. Maximal barrier sterile technique
utilized including caps, mask, sterile gowns, sterile gloves, large
sterile drape, hand hygiene, and Betadine prep.

Under sterile conditions and local anesthesia, ultrasound
micropuncture access performed of the right common femoral artery.
Five French sheath inserted over a guidewire. C2 catheter utilized
to select the celiac artery. Celiac angiogram performed.

Celiac: Celiac origin including its branches are widely patent.
Specifically, the splenic, left gastric, common hepatic,
gastroduodenal, and common hepatic vasculature all patent. No active
bleeding on this initial angiogram.

Over a Glidewire, the C2 catheter was advanced into the proximal
GDA. GDA angiogram performed.

GDA: GDA and gastroepiploic vasculature all patent as well as the
hepatic vasculature. Proximal GDA spasm noted from catheter and
guidewire access. No active arterial bleeding appreciated. Because
of the location by endoscopy, preventative emergent GDA embolization
will be performed.

GDA micro coil embolization: Through the C2 catheter, a Renegade STC
catheter was advanced more peripherally into the gastroduodenal
artery. Repeat GDA angiogram performed. Measurements obtained for
vessel diameter. GDA embolization successfully performed by
deploying interlock IDC 2D fibered coils ranging in size from 6-12
mm and up to 30 cm in length. A total of 10 coils deployed
successfully.

Post embolization angiogram confirms stasis in the GDA vasculature.

Access removed. Hemostasis obtained with the ExoSeal device. No
immediate complication. Patient tolerated the procedure well.
IMPRESSION: Successful GDA micro coil embolization for active bleeding pyloric
channel ulcer endoscopically

## 2022-09-23 ENCOUNTER — Encounter (HOSPITAL_COMMUNITY): Payer: Self-pay | Admitting: Emergency Medicine

## 2022-09-23 ENCOUNTER — Other Ambulatory Visit: Payer: Self-pay

## 2022-09-23 ENCOUNTER — Emergency Department (HOSPITAL_COMMUNITY)
Admission: EM | Admit: 2022-09-23 | Discharge: 2022-09-23 | Disposition: A | Discharge status: Home / Self Care | Payer: Medicare PPO | Attending: Emergency Medicine | Admitting: Emergency Medicine

## 2022-09-23 ENCOUNTER — Other Ambulatory Visit (HOSPITAL_COMMUNITY): Payer: Self-pay

## 2022-09-23 DIAGNOSIS — Z5900 Homelessness unspecified: Secondary | ICD-10-CM | POA: Diagnosis not present

## 2022-09-23 DIAGNOSIS — M79671 Pain in right foot: Secondary | ICD-10-CM | POA: Insufficient documentation

## 2022-09-23 DIAGNOSIS — M79672 Pain in left foot: Secondary | ICD-10-CM | POA: Diagnosis not present

## 2022-09-23 DIAGNOSIS — M79641 Pain in right hand: Secondary | ICD-10-CM | POA: Insufficient documentation

## 2022-09-23 DIAGNOSIS — M79642 Pain in left hand: Secondary | ICD-10-CM | POA: Insufficient documentation

## 2022-09-23 DIAGNOSIS — R238 Other skin changes: Secondary | ICD-10-CM

## 2022-09-23 DIAGNOSIS — R234 Changes in skin texture: Secondary | ICD-10-CM | POA: Insufficient documentation

## 2022-09-23 DIAGNOSIS — E871 Hypo-osmolality and hyponatremia: Secondary | ICD-10-CM | POA: Insufficient documentation

## 2022-09-23 LAB — CBC
HCT: 36.8 % — ABNORMAL LOW (ref 39.0–52.0)
Hemoglobin: 12.6 g/dL — ABNORMAL LOW (ref 13.0–17.0)
MCH: 32.4 pg (ref 26.0–34.0)
MCHC: 34.2 g/dL (ref 30.0–36.0)
MCV: 94.6 fL (ref 80.0–100.0)
Platelets: 269 10*3/uL (ref 150–400)
RBC: 3.89 MIL/uL — ABNORMAL LOW (ref 4.22–5.81)
RDW: 13.1 % (ref 11.5–15.5)
WBC: 6.6 10*3/uL (ref 4.0–10.5)
nRBC: 0 % (ref 0.0–0.2)

## 2022-09-23 LAB — BASIC METABOLIC PANEL
Anion gap: 12 (ref 5–15)
BUN: 7 mg/dL — ABNORMAL LOW (ref 8–23)
CO2: 20 mmol/L — ABNORMAL LOW (ref 22–32)
Calcium: 8.6 mg/dL — ABNORMAL LOW (ref 8.9–10.3)
Chloride: 98 mmol/L (ref 98–111)
Creatinine, Ser: 0.61 mg/dL (ref 0.61–1.24)
GFR, Estimated: >60 mL/min (ref >60)
Glucose, Bld: 82 mg/dL (ref 70–99)
Potassium: 3.9 mmol/L (ref 3.5–5.1)
Sodium: 130 mmol/L — ABNORMAL LOW (ref 135–145)

## 2022-09-23 MED ORDER — CHLORHEXIDINE GLUCONATE 4 % EX SOLN
Freq: Once | CUTANEOUS | Status: AC
  Filled 2022-09-23: qty 15

## 2022-09-23 MED ORDER — DOXYCYCLINE HYCLATE 100 MG PO CAPS
100.0000 mg | ORAL_CAPSULE | Freq: Two times a day (BID) | ORAL | 0 refills | Status: DC
  Filled 2022-09-23: qty 20, 10d supply, fill #0

## 2022-09-23 MED ORDER — HYDROCORTISONE 1 % EX OINT
TOPICAL_OINTMENT | Freq: Two times a day (BID) | CUTANEOUS | Status: DC
  Filled 2022-09-23: qty 28

## 2022-09-23 MED ORDER — SODIUM CHLORIDE 1 G PO TABS
2.0000 g | ORAL_TABLET | Freq: Once | ORAL | Status: AC
  Administered 2022-09-23: 2 g via ORAL
  Filled 2022-09-23: qty 2

## 2022-09-23 MED ORDER — LORAZEPAM 1 MG PO TABS
2.0000 mg | ORAL_TABLET | Freq: Once | ORAL | Status: AC
  Administered 2022-09-23: 2 mg via ORAL
  Filled 2022-09-23: qty 2

## 2022-09-23 MED ORDER — DOXYCYCLINE HYCLATE 100 MG PO TABS
100.0000 mg | ORAL_TABLET | Freq: Once | ORAL | Status: AC
  Administered 2022-09-23: 100 mg via ORAL
  Filled 2022-09-23: qty 1

## 2022-09-23 MED ORDER — MICONAZOLE NITRATE 2 % EX CREA
TOPICAL_CREAM | Freq: Two times a day (BID) | CUTANEOUS | Status: DC
  Filled 2022-09-23: qty 28

## 2022-09-23 NOTE — ED Triage Notes (Signed)
Pt arrives via EMS with reports of bilateral hands peeling for awhile and feet pain from walking.

## 2022-09-23 NOTE — Discharge Instructions (Signed)
As we discussed, I have given you materials to perform dressing changes of your hands at home.  Please do this every day with the materials I have given you.  I have also given you a prescription for antibiotics doxycycline for you to take as prescribed in its entirety to cover for any signs of a bacterial infection.  I have also given you a pair of socks to wear.  You need to keep your feet clean and dry is much as possible.  Return if development of any new or worsening symptoms.

## 2022-09-23 NOTE — ED Provider Notes (Signed)
[place] EMERGENCY DEPARTMENT AT [facility] Provider Note   CSN: [id number] Arrival date & time: 09/23/22  4098     History  Chief Complaint  Patient presents with   skin peeling    ARIES JOPP is a 67 y.o. male.  Patient with history of homelessness, alcohol abuse, bipolar disorder, depression, GERD, PUD, schizophrenia presents today with complaints of skin irritation. Patient states that he has skin irritation to his bilateral palms and soles. He has had longstanding issues with same to both of his feet and has been admitted for cellulitis previously and diagnosed with trench foot from wearing wet socks for long periods of time. States that he has never had the issue with his hands before. Notes that 2 weeks ago his hands became itchy and he started scratching them and the skin began to slough off and is painful. States he is not able to move his hands without pain. Denies fevers or chills.   The history is provided by the patient. No language interpreter was used.       Home Medications Prior to Admission medications   Medication Sig Start Date End Date Taking? Authorizing Provider  doxycycline (VIBRAMYCIN) 100 MG capsule Take 1 capsule (100 mg total) by mouth 2 (two) times daily. 09/23/22  Yes Kaytlyn Din A, PA-C  ARIPiprazole (ABILIFY) 5 MG tablet Take 1 tablet (5 mg total) by mouth daily. Patient not taking: Reported on 08/04/2022 07/31/22   Dimple Nanas, MD  folic acid (FOLVITE) 1 MG tablet Take 1 tablet (1 mg total) by mouth daily. Patient not taking: Reported on 08/04/2022 07/31/22   Dimple Nanas, MD  thiamine (VITAMIN B-1) 100 MG tablet Take 1 tablet (100 mg total) by mouth daily. Patient not taking: Reported on 08/04/2022 08/05/22   Dimple Nanas, MD  famotidine (PEPCID) 20 MG tablet Take 1 tablet (20 mg total) by mouth 2 (two) times daily. 10/13/18 10/19/18  Arby Barrette, MD      Allergies    Neosporin [neomycin-bacitracin zn-polymyx]     Review of Systems   Review of Systems  Skin:  Positive for wound.  All other systems reviewed and are negative.   Physical Exam Updated Vital Signs BP (!) 158/87 (BP Location: Left Arm)   Pulse 80   Temp 97.6 F (36.4 C) (Oral)   Resp 16   Ht 5\' 8"  (1.727 m)   Wt 74.8 kg   SpO2 98%   BMI 25.09 kg/m  Physical Exam Vitals and nursing note reviewed.  Constitutional:      General: He is not in acute distress.    Appearance: Normal appearance. He is normal weight. He is not ill-appearing, toxic-appearing or diaphoretic.  HENT:     Head: Normocephalic and atraumatic.  Cardiovascular:     Rate and Rhythm: Normal rate.  Pulmonary:     Effort: Pulmonary effort is normal. No respiratory distress.  Musculoskeletal:        General: Normal range of motion.     Cervical back: Normal range of motion.  Skin:    General: Skin is warm and dry.     Comments: Bilateral hands and feet with fissuring and sloughing of skin. No fluctuance, induration, erythema, or warmth. No blistering. No purulence. Capillary refill less than 2 seconds. Radial, ulnar, DP and PT pulses intact and 2+ bilaterally. See image below for further.  Neurological:     General: No focal deficit present.     Mental Status: He  is alert.  Psychiatric:        Mood and Affect: Mood normal.        Behavior: Behavior normal.            ED Results / Procedures / Treatments   Labs (all labs ordered are listed, but only abnormal results are displayed) Labs Reviewed  CBC - Abnormal; Notable for the following components:      Result Value   RBC 3.89 (*)    Hemoglobin 12.6 (*)    HCT 36.8 (*)    All other components within normal limits  BASIC METABOLIC PANEL - Abnormal; Notable for the following components:   Sodium 130 (*)    CO2 20 (*)    BUN 7 (*)    Calcium 8.6 (*)    All other components within normal limits    EKG None  Radiology No results found.  Procedures Procedures    Medications  Ordered in ED Medications  hydrocortisone 1 % ointment (has no administration in time range)  miconazole (MICOTIN) 2 % cream (has no administration in time range)  doxycycline (VIBRA-TABS) tablet 100 mg (has no administration in time range)  sodium chloride tablet 2 g (has no administration in time range)  chlorhexidine (HIBICLENS) 4 % liquid ( Topical Given 09/23/22 1044)    ED Course/ Medical Decision Making/ A&P                             Medical Decision Making Amount and/or Complexity of Data Reviewed Labs: ordered.  Risk OTC drugs. Prescription drug management.   This patient is a 67 y.o. male  who presents to the ED for concern of bilateral palms and soles skin irritation.   Differential diagnoses prior to evaluation: The emergent differential diagnosis includes, but is not limited to,  cellulitis, fungal infection, eczema, wound . This is not an exhaustive differential.   Past Medical History / Co-morbidities / Social History: history of homelessness, alcohol abuse, bipolar disorder, depression, GERD, PUD, schizophrenia  Additional history: Chart reviewed. Pertinent results include: patient seen several times previously for foot pain similar to today. Has been admitted for IV antibiotics previously. Always has negative imaging for osteo/septic arthritis. 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  Physical Exam: Physical exam performed. The pertinent findings include: See images above for further. Skin sloughing with fissures, no signs of superimposed infection.  Lab Tests/Imaging studies: I personally interpreted labs/imaging and the pertinent results include:  no leukocytosis, hgb at baseline. Na 130 consistent with previous. Bicarb 20.   Medications: I ordered medication including chlorhexidine rinse to soak his hands. Doxycycline to cover for any developing superimposed infection given poor hygiene with open wounds and documented colonized MRSA. Miconazole  cream and hydrocortisone cream. Salt tabs for hyponatremia.  I have reviewed the patients home medicines and have made adjustments as needed.   Disposition: After consideration of the diagnostic results and the patients response to treatment, I feel that emergency department workup does not suggest an emergent condition requiring admission or immediate intervention beyond what has been performed at this time. The plan is: discharge with doxycycline and materials for dressing changes. Patient with skin irritation, unclear etiology but appears similar to his chronic feet problems likely due to poor hygiene and homelessness. No signs of superimposed infection at this time. I have cleaned and dressed his and wounds and applied topical antifungal and steroid cream and given instructions for dressing changes  at home. Additionally, at the end of patients visit he requested 1 dose of ativan as he was starting to be tremulous from alcohol withdrawal. Patient given same. Offered further evaluation/admission for alcohol withdrawal which and resources which he declined. Evaluation and diagnostic testing in the emergency department does not suggest an emergent condition requiring admission or immediate intervention beyond what has been performed at this time.  Plan for discharge with close PCP follow-up.  Patient is understanding and amenable with plan, educated on red flag symptoms that would prompt immediate return.  Patient discharged in stable condition.   This is a shared visit with supervising physician Dr. Donnald Garre who has independently evaluated patient & provided guidance in evaluation/management/disposition, in agreement with care   Final Clinical Impression(s) / ED Diagnoses Final diagnoses:  Skin irritation  Bilateral hand pain  Bilateral foot pain    Rx / DC Orders ED Discharge Orders          Ordered    doxycycline (VIBRAMYCIN) 100 MG capsule  2 times daily        09/23/22 1322          An  After Visit Summary was printed and given to the patient.     Vear Clock 09/23/22 1532    Arby Barrette, MD 09/24/22 [phone]

## 2022-09-26 ENCOUNTER — Other Ambulatory Visit (HOSPITAL_COMMUNITY): Payer: Self-pay

## 2022-09-26 ENCOUNTER — Encounter (HOSPITAL_COMMUNITY): Payer: Self-pay

## 2022-09-26 ENCOUNTER — Emergency Department (HOSPITAL_COMMUNITY)
Admission: EM | Admit: 2022-09-26 | Discharge: 2022-09-26 | Disposition: A | Discharge status: Home / Self Care | Payer: Medicare PPO | Attending: Emergency Medicine | Admitting: Emergency Medicine

## 2022-09-26 ENCOUNTER — Other Ambulatory Visit: Payer: Self-pay

## 2022-09-26 DIAGNOSIS — L989 Disorder of the skin and subcutaneous tissue, unspecified: Secondary | ICD-10-CM | POA: Diagnosis present

## 2022-09-26 DIAGNOSIS — Y907 Blood alcohol level of 200-239 mg/100 ml: Secondary | ICD-10-CM | POA: Insufficient documentation

## 2022-09-26 DIAGNOSIS — F1010 Alcohol abuse, uncomplicated: Secondary | ICD-10-CM | POA: Diagnosis not present

## 2022-09-26 DIAGNOSIS — E871 Hypo-osmolality and hyponatremia: Secondary | ICD-10-CM | POA: Insufficient documentation

## 2022-09-26 DIAGNOSIS — R238 Other skin changes: Secondary | ICD-10-CM

## 2022-09-26 LAB — BASIC METABOLIC PANEL
Anion gap: 12 (ref 5–15)
Anion gap: 11 (ref 5–15)
BUN: 7 mg/dL — ABNORMAL LOW (ref 8–23)
BUN: 7 mg/dL — ABNORMAL LOW (ref 8–23)
CO2: 18 mmol/L — ABNORMAL LOW (ref 22–32)
CO2: 19 mmol/L — ABNORMAL LOW (ref 22–32)
Calcium: 8 mg/dL — ABNORMAL LOW (ref 8.9–10.3)
Calcium: 8 mg/dL — ABNORMAL LOW (ref 8.9–10.3)
Chloride: 94 mmol/L — ABNORMAL LOW (ref 98–111)
Chloride: 95 mmol/L — ABNORMAL LOW (ref 98–111)
Creatinine, Ser: 0.65 mg/dL (ref 0.61–1.24)
Creatinine, Ser: 0.66 mg/dL (ref 0.61–1.24)
GFR, Estimated: >60 mL/min (ref >60)
GFR, Estimated: >60 mL/min (ref >60)
Glucose, Bld: 83 mg/dL (ref 70–99)
Glucose, Bld: 146 mg/dL — ABNORMAL HIGH (ref 70–99)
Potassium: 3.9 mmol/L (ref 3.5–5.1)
Potassium: 3.6 mmol/L (ref 3.5–5.1)
Sodium: 124 mmol/L — ABNORMAL LOW (ref 135–145)
Sodium: 125 mmol/L — ABNORMAL LOW (ref 135–145)

## 2022-09-26 LAB — CBC
HCT: 31.7 % — ABNORMAL LOW (ref 39.0–52.0)
Hemoglobin: 10.9 g/dL — ABNORMAL LOW (ref 13.0–17.0)
MCH: 32.7 pg (ref 26.0–34.0)
MCHC: 34.4 g/dL (ref 30.0–36.0)
MCV: 95.2 fL (ref 80.0–100.0)
Platelets: 225 10*3/uL (ref 150–400)
RBC: 3.33 MIL/uL — ABNORMAL LOW (ref 4.22–5.81)
RDW: 12.9 % (ref 11.5–15.5)
WBC: 6.2 10*3/uL (ref 4.0–10.5)
nRBC: 0 % (ref 0.0–0.2)

## 2022-09-26 LAB — ETHANOL: Alcohol, Ethyl (B): 206 mg/dL — ABNORMAL HIGH (ref <10)

## 2022-09-26 LAB — RAPID URINE DRUG SCREEN, HOSP PERFORMED
Amphetamines: NOT DETECTED
Barbiturates: NOT DETECTED
Benzodiazepines: NOT DETECTED
Cocaine: POSITIVE — AB
Opiates: NOT DETECTED
Tetrahydrocannabinol: NOT DETECTED

## 2022-09-26 MED ORDER — SODIUM CHLORIDE 0.9 % IV BOLUS
1000.0000 mL | Freq: Once | INTRAVENOUS | Status: AC
  Administered 2022-09-26: 1000 mL via INTRAVENOUS

## 2022-09-26 MED ORDER — CHLORHEXIDINE GLUCONATE 4 % EX SOLN
Freq: Once | CUTANEOUS | Status: AC
  Filled 2022-09-26: qty 15

## 2022-09-26 NOTE — ED Notes (Signed)
Discharge instructions reviewed with pt, instructions on care for irritation reviewed with pt. Pt ambulatory from ED

## 2022-09-26 NOTE — ED Triage Notes (Signed)
Patient brought in by EMS due to bilateral hand infections. Reports that he was seen 3 days ago for the issue and his hands were wrapped and he was sent on his way. Patient's only complaint at this time is he is hungry.

## 2022-09-26 NOTE — ED Provider Notes (Signed)
[place] EMERGENCY DEPARTMENT AT [facility] Provider Note   CSN: [id number] Arrival date & time: 09/26/22  1506     History  Chief Complaint  Patient presents with   Hand Problem    Jimmy [name] is a 67 y.o. male past medical history significant for homelessness, tobacco abuse, alcohol abuse who presents with concern for ongoing irritation to bilateral hands.  Patient had raise concern for possible infection.  I do not see any pus or purulent drainage on the hands.  Patient was seen just 3 days ago for the same, he was sent home with 2 prescriptions, 1 for chlorhexidine wash, as well as doxycycline to cover for any possible overlying infection.  Patient reports that he has not picked up any of these antibiotics.  Patient reports that he cannot afford them.  Patient does endorse drinking earlier today  HPI     Home Medications Prior to Admission medications   Medication Sig Start Date End Date Taking? Authorizing Provider  ARIPiprazole (ABILIFY) 5 MG tablet Take 1 tablet (5 mg total) by mouth daily. Patient not taking: Reported on 08/04/2022 07/31/22   Dimple Nanas, MD  doxycycline (VIBRAMYCIN) 100 MG capsule Take 1 capsule (100 mg total) by mouth 2 (two) times daily. 09/23/22   Smoot, Shawn Route, PA-C  folic acid (FOLVITE) 1 MG tablet Take 1 tablet (1 mg total) by mouth daily. Patient not taking: Reported on 08/04/2022 07/31/22   Dimple Nanas, MD  thiamine (VITAMIN B-1) 100 MG tablet Take 1 tablet (100 mg total) by mouth daily. Patient not taking: Reported on 08/04/2022 08/05/22   Dimple Nanas, MD  famotidine (PEPCID) 20 MG tablet Take 1 tablet (20 mg total) by mouth 2 (two) times daily. 10/13/18 10/19/18  Arby Barrette, MD      Allergies    Neosporin [neomycin-bacitracin zn-polymyx]    Review of Systems   Review of Systems  All other systems reviewed and are negative.   Physical Exam Updated Vital Signs BP 132/72   Pulse 74   Temp 98.3 F (36.8 C)  (Oral)   Resp 18   Ht 5\' 8"  (1.727 m)   Wt 70.3 kg   SpO2 95%   BMI 23.57 kg/m  Physical Exam Vitals and nursing note reviewed.  Constitutional:      General: He is not in acute distress.    Appearance: Normal appearance.  HENT:     Head: Normocephalic and atraumatic.  Eyes:     General:        Right eye: No discharge.        Left eye: No discharge.  Cardiovascular:     Rate and Rhythm: Normal rate and regular rhythm.     Heart sounds: No murmur heard.    No friction rub. No gallop.  Pulmonary:     Effort: Pulmonary effort is normal.     Breath sounds: Normal breath sounds.  Abdominal:     General: Bowel sounds are normal.     Palpations: Abdomen is soft.  Skin:    General: Skin is warm and dry.     Capillary Refill: Capillary refill takes less than 2 seconds.     Comments: Patient with sloughing skin, cracking callus on bilateral hands, no evidence of drainage, pus, despite the thickened callus formation across the hand surface he does not have any areas that seem suspicious for abscess, or soft tissue induration, overall hands appear quite similar to pictures taken just 3  days ago in chart  Neurological:     Mental Status: He is alert and oriented to person, place, and time.  Psychiatric:        Mood and Affect: Mood normal.        Behavior: Behavior normal.     ED Results / Procedures / Treatments   Labs (all labs ordered are listed, but only abnormal results are displayed) Labs Reviewed  CBC - Abnormal; Notable for the following components:      Result Value   RBC 3.33 (*)    Hemoglobin 10.9 (*)    HCT 31.7 (*)    All other components within normal limits  BASIC METABOLIC PANEL - Abnormal; Notable for the following components:   Sodium 124 (*)    Chloride 94 (*)    CO2 18 (*)    BUN 7 (*)    Calcium 8.0 (*)    All other components within normal limits  ETHANOL - Abnormal; Notable for the following components:   Alcohol, Ethyl (B) 206 (*)    All other  components within normal limits  RAPID URINE DRUG SCREEN, HOSP PERFORMED - Abnormal; Notable for the following components:   Cocaine POSITIVE (*)    All other components within normal limits  BASIC METABOLIC PANEL - Abnormal; Notable for the following components:   Sodium 125 (*)    Chloride 95 (*)    CO2 19 (*)    Glucose, Bld 146 (*)    BUN 7 (*)    Calcium 8.0 (*)    All other components within normal limits    EKG EKG Interpretation Date/Time:  Friday September 26 2022 17:26:35 EDT Ventricular Rate:  80 PR Interval:  226 QRS Duration:  121 QT Interval:  395 QTC Calculation: 456 R Axis:   65  Text Interpretation: Sinus rhythm Prolonged PR interval Consider left atrial enlargement Right bundle branch block when compared to prior, similar appearance. No sTEMI Confirmed by Theda Belfast ([id number]) on 09/26/2022 5:42:22 PM  Radiology No results found.  Procedures Procedures    Medications Ordered in ED Medications  chlorhexidine (HIBICLENS) 4 % liquid ( Topical Given 09/26/22 1639)  sodium chloride 0.9 % bolus 1,000 mL (0 mLs Intravenous Stopped 09/26/22 1700)    ED Course/ Medical Decision Making/ A&P                             Medical Decision Making Amount and/or Complexity of Data Reviewed Labs: ordered.  Risk OTC drugs.   This patient is a 67 y.o. male who presents to the ED for concern of bilateral hand pain, irritation, this involves an extensive number of treatment options, and is a complaint that carries with it a high risk of complications and morbidity. The emergent differential diagnosis prior to evaluation includes, but is not limited to,  ongoing skin irritation chronically, alcohol abuse, hyponatremia, vs other. This is not an exhaustive differential.   Past Medical History / Co-morbidities / Social History: homelessness, tobacco abuse, alcohol abuse  Additional history: Chart reviewed. Pertinent results include: reviewed evaluation for same just three days  ago -- patient was supposed to take doxycycline, salt tabs but has been non complaint. Reports no interest in stopping drinking at this time  Physical Exam: Physical exam performed. The pertinent findings include: Patient with sloughing skin, cracking callus on bilateral hands, no evidence of drainage, pus, despite the thickened callus formation across the hand surface he does  not have any areas that seem suspicious for abscess, or soft tissue induration, overall hands appear quite similar to pictures taken just 3 days ago in chart   Lab Tests: I ordered, and personally interpreted labs.  The pertinent results include: Hyponatremia, sodium 124 on arrival, after 1 L fluid bolus sodium improved only to 125, he is drunk at this time with ethanol of 206, UDS positive for cocaine.  CBC is overall unremarkable, stable compared to his baseline.  He has mild bicarb deficit, CO2 19, but no anion gap at this time.   Cardiac Monitoring:  The patient was maintained on a cardiac monitor.  My attending physician Dr. Rush Landmark viewed and interpreted the cardiac monitored which showed an underlying rhythm of: NSR, no significant change from baseline. I agree with this interpretation.   Medications: I ordered medication including fluid bolus for hyponatremia, hibiclens for hand cleansing.   Consultations Obtained: I requested consultation with the hospitalist, spoke with Dr. Avie Arenas,  and discussed lab and imaging findings as well as pertinent plan - they recommend: no admission at this time for overall asymptomatic chronic hyponatremia   Disposition: After consideration of the diagnostic results and the patients response to treatment, I feel that patient is stable for discharge, doxycycline medication was arranged by SW/CM and patient discharged in stable condition at this time .   emergency department workup does not suggest an emergent condition requiring admission or immediate intervention beyond what has been  performed at this time. The plan is: Encouraged alcohol cessation, sodium tablets for hyponatremia, doxycycline for hands as planned previously. The patient is safe for discharge and has been instructed to return immediately for worsening symptoms, change in symptoms or any other concerns.  I discussed this case with my attending physician Dr. Rush Landmark who cosigned this note including patient's presenting symptoms, physical exam, and planned diagnostics and interventions. Attending physician stated agreement with plan or made changes to plan which were implemented.    Final Clinical Impression(s) / ED Diagnoses Final diagnoses:  Skin irritation  Hyponatremia  Alcohol abuse    Rx / DC Orders ED Discharge Orders     None         [place] 09/26/22 1959    Tegeler, Canary Brim, MD 09/26/22 2242

## 2022-09-26 NOTE — Care Management (Addendum)
Transition of Care [facility]) - Emergency Department Mini Assessment   Patient Details  Name: Jimmy [name] MRN: [id number] Date of Birth: 02-17-1956  [facility] CM/SW Contact:    Lavenia Atlas, RN Phone Number: 09/26/2022, 4:23 PM   Clinical Narrative: Received chat from [facility] that PA is requesting medication assistance. Per [facility] consult  Patient cannot afford doxycycline  Per chart review patient has medical insurance, confirmed w/ [facility] registration. Spoke w/ [facility] who reports patient would be [facility], however patient does not qualify for [facility] program.    TOC will continue to follow.   - 4:50pm This RNCM delivered medications to patient's bedside with bus pass. Attached housing resources to AVS. Notified RN, MD  No additional TOC needs at this time.  ED Mini Assessment: What brought you to the Emergency Department? : bilateral hand infection seen 3 days ago.  Barriers to Discharge: No Barriers Identified  Barrier interventions: assist with medications  Means of departure: Public Transportation  Interventions which prevented an admission or readmission: Medication Review    Patient Contact and Communications        ,          Patient states their goals for this hospitalization and ongoing recovery are:: feel better CMS Medicare.gov Compare Post Acute Care list provided to:: Patient Choice offered to / list presented to : Patient  Admission diagnosis:  skin infection Patient Active Problem List   Diagnosis Date Noted   Leukocytosis 07/27/2022   Suicidal ideation 07/27/2022   Acute hyponatremia 07/26/2022   Hypomagnesemia 01/14/2022   Scabies 01/14/2022   Cough 01/14/2022   Hypoalbuminemia 01/14/2022   Normocytic anemia 01/14/2022   Cellulitis of both feet 01/10/2022   Cellulitis of left foot 06/22/2021   Pressure injury of skin 06/22/2021   Acute upper GI bleed 09/20/2020   Nicotine dependence, cigarettes, uncomplicated 09/20/2020    Acute blood loss anemia 09/20/2020   Hyponatremia 09/20/2020   Acute gastric ulcer with hemorrhage    Alcohol abuse with alcohol-induced mood disorder (HCC) 01/09/2016   Homicidal ideation    Chronic alcohol abuse 06/24/2015   Substance induced mood disorder (HCC) 06/24/2015   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   Fracture of bone 06/11/2015   Suicidal ideations 03/02/2015   Polysubstance abuse (HCC) 09/06/2014   GIB (gastrointestinal bleeding) 09/06/2014   Homeless 09/06/2014   GERD (gastroesophageal reflux disease)    PUD (peptic ulcer disease)    Gastroesophageal reflux disease without esophagitis    Hypokalemia    Alcohol abuse 12/05/2013   S/P alcohol detoxification 12/01/2013   Seizure (HCC) 08/24/2013   Tobacco abuse 08/19/2013   PCP:  System, Provider Not In Pharmacy:   Ger[facility] - [facility] [address] [place] [address] Phone: [phone] Fax: [phone]

## 2022-10-12 ENCOUNTER — Other Ambulatory Visit: Payer: Self-pay

## 2022-10-12 ENCOUNTER — Encounter (HOSPITAL_COMMUNITY): Payer: Self-pay

## 2022-10-12 ENCOUNTER — Emergency Department (HOSPITAL_COMMUNITY): Payer: Medicare PPO

## 2022-10-12 ENCOUNTER — Emergency Department (HOSPITAL_COMMUNITY)
Admission: EM | Admit: 2022-10-12 | Discharge: 2022-10-12 | Disposition: A | Discharge status: Home / Self Care | Payer: Medicare PPO | Attending: Emergency Medicine | Admitting: Emergency Medicine

## 2022-10-12 DIAGNOSIS — S01511A Laceration without foreign body of lip, initial encounter: Secondary | ICD-10-CM | POA: Diagnosis not present

## 2022-10-12 DIAGNOSIS — S0993XA Unspecified injury of face, initial encounter: Secondary | ICD-10-CM | POA: Diagnosis present

## 2022-10-12 DIAGNOSIS — S0083XA Contusion of other part of head, initial encounter: Secondary | ICD-10-CM

## 2022-10-12 DIAGNOSIS — R519 Headache, unspecified: Secondary | ICD-10-CM | POA: Diagnosis not present

## 2022-10-12 MED ORDER — KETOROLAC TROMETHAMINE 30 MG/ML IJ SOLN
30.0000 mg | Freq: Once | INTRAMUSCULAR | Status: AC
  Administered 2022-10-12: 30 mg via INTRAMUSCULAR
  Filled 2022-10-12: qty 1

## 2022-10-12 MED ORDER — LIDOCAINE VISCOUS HCL 2 % MT SOLN
15.0000 mL | Freq: Once | OROMUCOSAL | Status: AC
  Administered 2022-10-12: 15 mL via OROMUCOSAL
  Filled 2022-10-12: qty 15

## 2022-10-12 MED ORDER — IBUPROFEN 600 MG PO TABS
600.0000 mg | ORAL_TABLET | Freq: Four times a day (QID) | ORAL | 0 refills | Status: DC | PRN
  Filled 2022-10-12: qty 30, 8d supply, fill #0

## 2022-10-12 NOTE — Discharge Instructions (Addendum)
Try to eat soft foods and cold foods, for the next 2 to 3 days.  The cut on the inside of your lip should heal rapidly over the next 3 days.  You can take Tylenol and ibuprofen as needed for your facial pain and swelling.  There are no broken bones seen on your CT scan or bleeding inside the skull.

## 2022-10-12 NOTE — ED Notes (Signed)
Please call Annette Stable (friend) [phone] for patient pick up
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
## 2022-10-13 ENCOUNTER — Other Ambulatory Visit: Payer: Self-pay

## 2022-10-13 ENCOUNTER — Other Ambulatory Visit (HOSPITAL_COMMUNITY): Payer: Self-pay

## 2022-10-21 ENCOUNTER — Other Ambulatory Visit (HOSPITAL_COMMUNITY): Payer: Self-pay

## 2022-10-23 ENCOUNTER — Encounter (HOSPITAL_COMMUNITY): Payer: Self-pay | Admitting: *Deleted

## 2022-10-23 ENCOUNTER — Observation Stay (HOSPITAL_COMMUNITY)
Admission: EM | Admit: 2022-10-23 | Discharge: 2022-10-25 | Disposition: A | Discharge status: Home / Self Care | Payer: Medicare PPO | Attending: Internal Medicine | Admitting: Internal Medicine

## 2022-10-23 ENCOUNTER — Encounter (HOSPITAL_COMMUNITY): Payer: Self-pay

## 2022-10-23 ENCOUNTER — Other Ambulatory Visit: Payer: Self-pay

## 2022-10-23 ENCOUNTER — Emergency Department (HOSPITAL_COMMUNITY)
Admission: EM | Admit: 2022-10-23 | Discharge: 2022-10-23 | Discharge status: Against Medical Advice | Payer: Medicare PPO | Attending: Emergency Medicine | Admitting: Emergency Medicine

## 2022-10-23 DIAGNOSIS — F17210 Nicotine dependence, cigarettes, uncomplicated: Secondary | ICD-10-CM | POA: Insufficient documentation

## 2022-10-23 DIAGNOSIS — E860 Dehydration: Secondary | ICD-10-CM | POA: Insufficient documentation

## 2022-10-23 DIAGNOSIS — R112 Nausea with vomiting, unspecified: Secondary | ICD-10-CM | POA: Diagnosis present

## 2022-10-23 DIAGNOSIS — N179 Acute kidney failure, unspecified: Secondary | ICD-10-CM | POA: Diagnosis not present

## 2022-10-23 DIAGNOSIS — R1110 Vomiting, unspecified: Secondary | ICD-10-CM | POA: Insufficient documentation

## 2022-10-23 DIAGNOSIS — F10239 Alcohol dependence with withdrawal, unspecified: Secondary | ICD-10-CM | POA: Insufficient documentation

## 2022-10-23 DIAGNOSIS — E871 Hypo-osmolality and hyponatremia: Secondary | ICD-10-CM | POA: Diagnosis not present

## 2022-10-23 DIAGNOSIS — Z5900 Homelessness unspecified: Secondary | ICD-10-CM | POA: Insufficient documentation

## 2022-10-23 DIAGNOSIS — Z5321 Procedure and treatment not carried out due to patient leaving prior to being seen by health care provider: Secondary | ICD-10-CM | POA: Insufficient documentation

## 2022-10-23 DIAGNOSIS — D539 Nutritional anemia, unspecified: Secondary | ICD-10-CM | POA: Insufficient documentation

## 2022-10-23 DIAGNOSIS — T675XXA Heat exhaustion, unspecified, initial encounter: Secondary | ICD-10-CM | POA: Insufficient documentation

## 2022-10-23 DIAGNOSIS — F10229 Alcohol dependence with intoxication, unspecified: Secondary | ICD-10-CM | POA: Diagnosis not present

## 2022-10-23 DIAGNOSIS — D72829 Elevated white blood cell count, unspecified: Secondary | ICD-10-CM | POA: Diagnosis not present

## 2022-10-23 DIAGNOSIS — F319 Bipolar disorder, unspecified: Secondary | ICD-10-CM | POA: Diagnosis present

## 2022-10-23 DIAGNOSIS — F1090 Alcohol use, unspecified, uncomplicated: Secondary | ICD-10-CM | POA: Diagnosis present

## 2022-10-23 DIAGNOSIS — F10939 Alcohol use, unspecified with withdrawal, unspecified: Secondary | ICD-10-CM | POA: Diagnosis present

## 2022-10-23 DIAGNOSIS — E878 Other disorders of electrolyte and fluid balance, not elsewhere classified: Secondary | ICD-10-CM | POA: Diagnosis not present

## 2022-10-23 DIAGNOSIS — Y907 Blood alcohol level of 200-239 mg/100 ml: Secondary | ICD-10-CM | POA: Diagnosis not present

## 2022-10-23 LAB — COMPREHENSIVE METABOLIC PANEL
ALT: 26 U/L (ref 0–44)
ALT: 27 U/L (ref 0–44)
AST: 35 U/L (ref 15–41)
AST: 38 U/L (ref 15–41)
Albumin: 4.2 g/dL (ref 3.5–5.0)
Albumin: 4.6 g/dL (ref 3.5–5.0)
Alkaline Phosphatase: 67 U/L (ref 38–126)
Alkaline Phosphatase: 71 U/L (ref 38–126)
Anion gap: 11 (ref 5–15)
Anion gap: 15 (ref 5–15)
BUN: 9 mg/dL (ref 8–23)
BUN: 15 mg/dL (ref 8–23)
CO2: 25 mmol/L (ref 22–32)
CO2: 23 mmol/L (ref 22–32)
Calcium: 9.7 mg/dL (ref 8.9–10.3)
Calcium: 10 mg/dL (ref 8.9–10.3)
Chloride: 93 mmol/L — ABNORMAL LOW (ref 98–111)
Chloride: 90 mmol/L — ABNORMAL LOW (ref 98–111)
Creatinine, Ser: 0.86 mg/dL (ref 0.61–1.24)
Creatinine, Ser: 1.94 mg/dL — ABNORMAL HIGH (ref 0.61–1.24)
GFR, Estimated: >60 mL/min (ref >60)
GFR, Estimated: 38 mL/min — ABNORMAL LOW (ref >60)
Glucose, Bld: 101 mg/dL — ABNORMAL HIGH (ref 70–99)
Glucose, Bld: 111 mg/dL — ABNORMAL HIGH (ref 70–99)
Potassium: 3.8 mmol/L (ref 3.5–5.1)
Potassium: 3.7 mmol/L (ref 3.5–5.1)
Sodium: 129 mmol/L — ABNORMAL LOW (ref 135–145)
Sodium: 128 mmol/L — ABNORMAL LOW (ref 135–145)
Total Bilirubin: 0.6 mg/dL (ref 0.3–1.2)
Total Bilirubin: 0.5 mg/dL (ref 0.3–1.2)
Total Protein: 8.7 g/dL — ABNORMAL HIGH (ref 6.5–8.1)
Total Protein: 9.3 g/dL — ABNORMAL HIGH (ref 6.5–8.1)

## 2022-10-23 LAB — URINALYSIS, ROUTINE W REFLEX MICROSCOPIC
Bilirubin Urine: NEGATIVE
Glucose, UA: NEGATIVE mg/dL
Hgb urine dipstick: NEGATIVE
Ketones, ur: NEGATIVE mg/dL
Leukocytes,Ua: NEGATIVE
Nitrite: NEGATIVE
Protein, ur: NEGATIVE mg/dL
Specific Gravity, Urine: 1.009 (ref 1.005–1.030)
pH: 7 (ref 5.0–8.0)

## 2022-10-23 LAB — CBC
HCT: 37.6 % — ABNORMAL LOW (ref 39.0–52.0)
HCT: 38.4 % — ABNORMAL LOW (ref 39.0–52.0)
Hemoglobin: 12.7 g/dL — ABNORMAL LOW (ref 13.0–17.0)
Hemoglobin: 12.9 g/dL — ABNORMAL LOW (ref 13.0–17.0)
MCH: 33 pg (ref 26.0–34.0)
MCH: 32.7 pg (ref 26.0–34.0)
MCHC: 33.8 g/dL (ref 30.0–36.0)
MCHC: 33.6 g/dL (ref 30.0–36.0)
MCV: 97.7 fL (ref 80.0–100.0)
MCV: 97.5 fL (ref 80.0–100.0)
Platelets: 393 10*3/uL (ref 150–400)
Platelets: 384 10*3/uL (ref 150–400)
RBC: 3.85 MIL/uL — ABNORMAL LOW (ref 4.22–5.81)
RBC: 3.94 MIL/uL — ABNORMAL LOW (ref 4.22–5.81)
RDW: 12.6 % (ref 11.5–15.5)
RDW: 12.7 % (ref 11.5–15.5)
WBC: 7.8 10*3/uL (ref 4.0–10.5)
WBC: 10.8 10*3/uL — ABNORMAL HIGH (ref 4.0–10.5)
nRBC: 0 % (ref 0.0–0.2)
nRBC: 0 % (ref 0.0–0.2)

## 2022-10-23 LAB — LIPASE, BLOOD
Lipase: 44 U/L (ref 11–51)
Lipase: 45 U/L (ref 11–51)

## 2022-10-23 MED ORDER — ONDANSETRON 4 MG PO TBDP
4.0000 mg | ORAL_TABLET | Freq: Once | ORAL | Status: AC | PRN
  Administered 2022-10-23: 4 mg via ORAL
  Filled 2022-10-23: qty 1

## 2022-10-23 NOTE — ED Triage Notes (Signed)
Arrives EMS for ongoing nausea and vomiting. Presented earlier today for same.

## 2022-10-23 NOTE — ED Notes (Signed)
Several episodes of vomiting in triage.

## 2022-10-23 NOTE — ED Triage Notes (Signed)
Reports vomiting since yesterday, feels dehydrated, also concerned about feet peeling.

## 2022-10-24 ENCOUNTER — Encounter (HOSPITAL_COMMUNITY): Payer: Self-pay | Admitting: Internal Medicine

## 2022-10-24 DIAGNOSIS — E871 Hypo-osmolality and hyponatremia: Secondary | ICD-10-CM

## 2022-10-24 DIAGNOSIS — N179 Acute kidney failure, unspecified: Secondary | ICD-10-CM | POA: Diagnosis not present

## 2022-10-24 DIAGNOSIS — T675XXA Heat exhaustion, unspecified, initial encounter: Secondary | ICD-10-CM

## 2022-10-24 DIAGNOSIS — F1090 Alcohol use, unspecified, uncomplicated: Secondary | ICD-10-CM | POA: Diagnosis not present

## 2022-10-24 DIAGNOSIS — F10930 Alcohol use, unspecified with withdrawal, uncomplicated: Secondary | ICD-10-CM

## 2022-10-24 DIAGNOSIS — F319 Bipolar disorder, unspecified: Secondary | ICD-10-CM

## 2022-10-24 LAB — PHOSPHORUS: Phosphorus: 2.7 mg/dL (ref 2.5–4.6)

## 2022-10-24 LAB — MAGNESIUM: Magnesium: 1.6 mg/dL — ABNORMAL LOW (ref 1.7–2.4)

## 2022-10-24 LAB — BASIC METABOLIC PANEL
Anion gap: 11 (ref 5–15)
BUN: 17 mg/dL (ref 8–23)
CO2: 24 mmol/L (ref 22–32)
Calcium: 8.6 mg/dL — ABNORMAL LOW (ref 8.9–10.3)
Chloride: 97 mmol/L — ABNORMAL LOW (ref 98–111)
Creatinine, Ser: 1.08 mg/dL (ref 0.61–1.24)
GFR, Estimated: >60 mL/min (ref >60)
Glucose, Bld: 97 mg/dL (ref 70–99)
Potassium: 3.8 mmol/L (ref 3.5–5.1)
Sodium: 132 mmol/L — ABNORMAL LOW (ref 135–145)

## 2022-10-24 LAB — CK: Total CK: 101 U/L (ref 49–397)

## 2022-10-24 MED ORDER — SODIUM CHLORIDE 0.9% FLUSH: 3.0000 mL | INTRAVENOUS | Status: DC | PRN

## 2022-10-24 MED ORDER — LORAZEPAM 2 MG/ML IJ SOLN: 1.0000 mg | INTRAMUSCULAR | Status: DC | PRN

## 2022-10-24 MED ORDER — SODIUM CHLORIDE 0.9 % IV SOLN: 250.0000 mL | INTRAVENOUS | Status: DC | PRN

## 2022-10-24 MED ORDER — FOLIC ACID 1 MG PO TABS
1.0000 mg | ORAL_TABLET | Freq: Every day | ORAL | Status: DC
  Administered 2022-10-24 – 2022-10-25 (×2): 1 mg via ORAL
  Filled 2022-10-24 (×2): qty 1

## 2022-10-24 MED ORDER — ACETAMINOPHEN 325 MG PO TABS: 650.0000 mg | ORAL_TABLET | Freq: Four times a day (QID) | ORAL | Status: DC | PRN

## 2022-10-24 MED ORDER — UNJURY CHICKEN SOUP POWDER
2.0000 [oz_av] | Freq: Four times a day (QID) | ORAL | Status: DC
  Filled 2022-10-24 (×2): qty 13.5

## 2022-10-24 MED ORDER — SODIUM CHLORIDE 0.9% FLUSH
3.0000 mL | Freq: Two times a day (BID) | INTRAVENOUS | Status: DC
  Administered 2022-10-24 – 2022-10-25 (×2): 3 mL via INTRAVENOUS

## 2022-10-24 MED ORDER — ACETAMINOPHEN 650 MG RE SUPP: 650.0000 mg | Freq: Four times a day (QID) | RECTAL | Status: DC | PRN

## 2022-10-24 MED ORDER — ONDANSETRON HCL 4 MG PO TABS: 4.0000 mg | ORAL_TABLET | Freq: Four times a day (QID) | ORAL | Status: DC | PRN

## 2022-10-24 MED ORDER — THIAMINE HCL 100 MG/ML IJ SOLN: 100.0000 mg | Freq: Every day | INTRAMUSCULAR | Status: DC

## 2022-10-24 MED ORDER — ONDANSETRON HCL 4 MG/2ML IJ SOLN
4.0000 mg | Freq: Four times a day (QID) | INTRAMUSCULAR | Status: DC | PRN
  Administered 2022-10-24: 4 mg via INTRAVENOUS
  Filled 2022-10-24: qty 2

## 2022-10-24 MED ORDER — ENOXAPARIN SODIUM 40 MG/0.4ML IJ SOSY
40.0000 mg | PREFILLED_SYRINGE | INTRAMUSCULAR | Status: DC
  Administered 2022-10-24 – 2022-10-25 (×2): 40 mg via SUBCUTANEOUS
  Filled 2022-10-24 (×2): qty 0.4

## 2022-10-24 MED ORDER — ORAL CARE MOUTH RINSE: 15.0000 mL | OROMUCOSAL | Status: DC | PRN

## 2022-10-24 MED ORDER — UNJURY PLANTED PROTEIN POWDER
2.0000 [oz_av] | Freq: Four times a day (QID) | ORAL | Status: DC
  Administered 2022-10-24 – 2022-10-25 (×2): 2 [oz_av] via ORAL
  Filled 2022-10-24 (×3): qty 25

## 2022-10-24 MED ORDER — LORAZEPAM 1 MG PO TABS: 1.0000 mg | ORAL_TABLET | ORAL | Status: DC | PRN

## 2022-10-24 MED ORDER — MAGNESIUM SULFATE 2 GM/50ML IV SOLN
2.0000 g | Freq: Once | INTRAVENOUS | Status: AC
  Administered 2022-10-24: 2 g via INTRAVENOUS
  Filled 2022-10-24: qty 50

## 2022-10-24 MED ORDER — LACTATED RINGERS IV SOLN: INTRAVENOUS | Status: AC

## 2022-10-24 MED ORDER — THIAMINE MONONITRATE 100 MG PO TABS
100.0000 mg | ORAL_TABLET | Freq: Every day | ORAL | Status: DC
  Administered 2022-10-24 – 2022-10-25 (×2): 100 mg via ORAL
  Filled 2022-10-24 (×2): qty 1

## 2022-10-24 MED ORDER — ONDANSETRON HCL 4 MG/2ML IJ SOLN
4.0000 mg | Freq: Once | INTRAMUSCULAR | Status: AC
  Administered 2022-10-24: 4 mg via INTRAVENOUS
  Filled 2022-10-24: qty 2

## 2022-10-24 MED ORDER — SODIUM CHLORIDE 0.9 % IV BOLUS
2000.0000 mL | Freq: Once | INTRAVENOUS | Status: AC
  Administered 2022-10-24: 2000 mL via INTRAVENOUS

## 2022-10-24 MED ORDER — HYDRALAZINE HCL 20 MG/ML IJ SOLN
10.0000 mg | Freq: Four times a day (QID) | INTRAMUSCULAR | Status: DC | PRN
  Administered 2022-10-24: 10 mg via INTRAVENOUS
  Filled 2022-10-24: qty 1

## 2022-10-24 MED ORDER — ADULT MULTIVITAMIN W/MINERALS CH
1.0000 | ORAL_TABLET | Freq: Every day | ORAL | Status: DC
  Administered 2022-10-24 – 2022-10-25 (×2): 1 via ORAL
  Filled 2022-10-24 (×2): qty 1

## 2022-10-24 MED ORDER — SODIUM CHLORIDE 0.9 % IV SOLN: INTRAVENOUS | Status: DC

## 2022-10-24 NOTE — ED Notes (Signed)
Pt given water for PO challenge 
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
## 2022-10-24 NOTE — ED Notes (Signed)
Pt currently throwing up 
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
## 2022-10-25 DIAGNOSIS — N179 Acute kidney failure, unspecified: Secondary | ICD-10-CM | POA: Diagnosis not present

## 2022-10-25 MED ORDER — POTASSIUM CHLORIDE CRYS ER 20 MEQ PO TBCR
40.0000 meq | EXTENDED_RELEASE_TABLET | Freq: Two times a day (BID) | ORAL | Status: DC
  Administered 2022-10-25: 40 meq via ORAL
  Filled 2022-10-25: qty 2

## 2022-10-25 MED ORDER — ONDANSETRON HCL 4 MG PO TABS: 4.0000 mg | ORAL_TABLET | Freq: Three times a day (TID) | ORAL | 0 refills | Status: DC | PRN

## 2022-10-25 MED ORDER — MULTIVITAMINS PO CAPS: 1.0000 | ORAL_CAPSULE | Freq: Every day | ORAL | 2 refills | Status: DC

## 2022-10-25 NOTE — Discharge Summary (Signed)
Physician Discharge Summary  Jimmy [name] QMV:[id number] DOB: 1955/07/17 DOA: 10/23/2022  PCP: Patient, No Pcp Per  Admit date: 10/23/2022 Discharge date: 10/25/2022  Admitted From: Home Disposition: Home.  Recommendations for Outpatient Follow-up:  Follow up with PCP in 1-2 weeks Please obtain BMP/CBC in one week  Discharge Condition: Stable CODE STATUS: Full code Diet recommendation: Regular diet, nutritional supplements, plenty of hydration.  Avoid alcohol.  Discharge summary:  chronic alcohol use, chronic hyponatremia, bipolar 1 disorder, homeless who spent all day outside in the heat drinking beer, started vomiting so came to the hospital.  In the emergency room hemodynamically stable.  Sodium 129.  Creatinine 1.94 with recent creatinine of 1.  Admitted due to AKI, alcohol intoxication with alcohol level of 206.   Nausea vomiting, acute kidney injury secondary to prerenal injury, alcohol induced nausea vomiting and gastritis.  Clinically improved.  On regular diet and tolerating.  Renal functions improved and normalized.  Urine output is adequate.  No evidence of alcohol withdrawal. Counseling done, however he is not likely to quit drinking alcohol.   He does not want to go to a shelter.  He wants to go to street. With clinical improvement, he is stable for discharge. Magnesium was replaced. Potassium was replaced before discharge.   Discharge Diagnoses:  Principal Problem:   AKI (acute kidney injury) (HCC) Active Problems:   Heat exhaustion   Chronic alcohol use   Chronic hyponatremia   Bipolar 1 disorder (HCC)   Alcohol withdrawal (HCC)    Discharge Instructions  Discharge Instructions     Diet - low sodium heart healthy   Complete by: As directed    Discharge instructions   Complete by: As directed    Drink plenty of water  Do not drink any alcohol and do not use any drugs   Increase activity slowly   Complete by: As directed       Allergies as of  10/25/2022       Reactions   Neosporin [neomycin-bacitracin Zn-polymyx] Rash        Medication List     STOP taking these medications    ibuprofen 600 MG tablet Commonly known as: ADVIL       TAKE these medications    multivitamin capsule Take 1 capsule by mouth daily.   ondansetron 4 MG tablet Commonly known as: Zofran Take 1 tablet (4 mg total) by mouth every 8 (eight) hours as needed for nausea or vomiting.        Allergies  Allergen Reactions   Neosporin [Neomycin-Bacitracin Zn-Polymyx] Rash    Consultations: None   Procedures/Studies: CT Head Wo Contrast  Result Date: 10/12/2022 CLINICAL DATA:  Blunt facial trauma. EXAM: CT HEAD WITHOUT CONTRAST CT MAXILLOFACIAL WITHOUT CONTRAST TECHNIQUE: Multidetector CT imaging of the head and maxillofacial structures were performed using the standard protocol without intravenous contrast. Multiplanar CT image reconstructions of the maxillofacial structures were also generated. RADIATION DOSE REDUCTION: This exam was performed according to the departmental dose-optimization program which includes automated exposure control, adjustment of the mA and/or kV according to patient size and/or use of iterative reconstruction technique. COMPARISON:  None Available. FINDINGS: CT HEAD FINDINGS Brain: No evidence of acute infarction, hemorrhage, hydrocephalus, extra-axial collection or mass lesion/mass effect. Vascular: No hyperdense vessel or unexpected calcification. Skull: Normal. Negative for fracture or focal lesion. Other: None. CT MAXILLOFACIAL FINDINGS Osseous: No fracture or mandibular dislocation. No destructive process. Orbits: Negative. No traumatic or inflammatory finding. Patient motion limits evaluation. Sinuses: Clear. Soft  tissues: No appreciable hematoma or large fluid collection, evaluation is somewhat limited due to patient motion. IMPRESSION: CT HEAD: No acute intracranial abnormality. CT MAXILLOFACIAL: 1. No acute facial  bone fracture. 2. Patient motion limits evaluation. Electronically Signed   By: Larose Hires D.O.   On: 10/12/2022 21:37   CT Maxillofacial Wo Contrast  Result Date: 10/12/2022 CLINICAL DATA:  Blunt facial trauma. EXAM: CT HEAD WITHOUT CONTRAST CT MAXILLOFACIAL WITHOUT CONTRAST TECHNIQUE: Multidetector CT imaging of the head and maxillofacial structures were performed using the standard protocol without intravenous contrast. Multiplanar CT image reconstructions of the maxillofacial structures were also generated. RADIATION DOSE REDUCTION: This exam was performed according to the departmental dose-optimization program which includes automated exposure control, adjustment of the mA and/or kV according to patient size and/or use of iterative reconstruction technique. COMPARISON:  None Available. FINDINGS: CT HEAD FINDINGS Brain: No evidence of acute infarction, hemorrhage, hydrocephalus, extra-axial collection or mass lesion/mass effect. Vascular: No hyperdense vessel or unexpected calcification. Skull: Normal. Negative for fracture or focal lesion. Other: None. CT MAXILLOFACIAL FINDINGS Osseous: No fracture or mandibular dislocation. No destructive process. Orbits: Negative. No traumatic or inflammatory finding. Patient motion limits evaluation. Sinuses: Clear. Soft tissues: No appreciable hematoma or large fluid collection, evaluation is somewhat limited due to patient motion. IMPRESSION: CT HEAD: No acute intracranial abnormality. CT MAXILLOFACIAL: 1. No acute facial bone fracture. 2. Patient motion limits evaluation. Electronically Signed   By: Larose Hires D.O.   On: 10/12/2022 21:37   (Echo, Carotid, EGD, Colonoscopy, ERCP)    Subjective: Patient seen in the morning rounds.  Denies any complaints.  He needs to go home.  We discussed about not drinking alcohol.  Patient is smiles and tells me that he is ready for a chilled cold beer.    Discharge Exam: Vitals:   10/24/22 2341 10/25/22 0437  BP: (!)  147/75 123/61  Pulse: 89 84  Resp: 18 14  Temp: 98.2 F (36.8 C) 98.3 F (36.8 C)  SpO2: 98% 99%   Vitals:   10/24/22 2020 10/24/22 2341 10/25/22 0437 10/25/22 0447  BP: (!) 169/73 (!) 147/75 123/61   Pulse: 63 89 84   Resp: 15 18 14    Temp: 98.4 F (36.9 C) 98.2 F (36.8 C) 98.3 F (36.8 C)   TempSrc: Oral Oral    SpO2: 100% 98% 99%   Weight:    76 kg  Height:        General: Pt is alert, awake, not in acute distress Walking around in the hallway. He is oriented x 4.  Normal mood.  Interactive and pleasant in conversation. Cardiovascular: RRR, S1/S2 +, no rubs, no gallops Respiratory: CTA bilaterally, no wheezing, no rhonchi Abdominal: Soft, NT, ND, bowel sounds + Extremities: no edema, no cyanosis    The results of significant diagnostics from this hospitalization (including imaging, microbiology, ancillary and laboratory) are listed below for reference.     Microbiology: No results found for this or any previous visit (from the past 240 hour(s)).   Labs: BNP (last 3 results) Recent Labs    07/27/22 0910  BNP 8.5   Basic Metabolic Panel: Recent Labs  Lab 10/23/22 1130 10/23/22 2217 10/24/22 1212 10/25/22 0443  NA 129* 128* 132* 129*  K 3.8 3.7 3.8 3.2*  CL 93* 90* 97* 96*  CO2 25 23 24 27   GLUCOSE 101* 111* 97 126*  BUN 9 15 17 18   CREATININE 0.86 1.94* 1.08 0.90  CALCIUM 9.7 10.0 8.6* 8.3*  MG  --   --  1.6*  --   PHOS  --   --  2.7  --    Liver Function Tests: Recent Labs  Lab 10/23/22 1130 10/23/22 2217 10/25/22 0443  AST 35 38 22  ALT 26 27 17   ALKPHOS 67 71 49  BILITOT 0.6 0.5 0.6  PROT 8.7* 9.3* 6.6  ALBUMIN 4.2 4.6 3.2*   Recent Labs  Lab 10/23/22 1130 10/23/22 2217  LIPASE 44 45   No results for input(s): "AMMONIA" in the last 168 hours. CBC: Recent Labs  Lab 10/23/22 1130 10/23/22 2217  WBC 7.8 10.8*  HGB 12.7* 12.9*  HCT 37.6* 38.4*  MCV 97.7 97.5  PLT 393 384   Cardiac Enzymes: Recent Labs  Lab  10/23/22 2217  CKTOTAL 101   BNP: Invalid input(s): "POCBNP" CBG: No results for input(s): "GLUCAP" in the last 168 hours. D-Dimer No results for input(s): "DDIMER" in the last 72 hours. Hgb A1c No results for input(s): "HGBA1C" in the last 72 hours. Lipid Profile No results for input(s): "CHOL", "HDL", "LDLCALC", "TRIG", "CHOLHDL", "LDLDIRECT" in the last 72 hours. Thyroid function studies No results for input(s): "TSH", "T4TOTAL", "T3FREE", "THYROIDAB" in the last 72 hours.  Invalid input(s): "FREET3" Anemia work up No results for input(s): "VITAMINB12", "FOLATE", "FERRITIN", "TIBC", "IRON", "RETICCTPCT" in the last 72 hours. Urinalysis    Component Value Date/Time   COLORURINE YELLOW 10/23/2022 1041   APPEARANCEUR CLOUDY (A) 10/23/2022 1041   LABSPEC 1.009 10/23/2022 1041   PHURINE 7.0 10/23/2022 1041   GLUCOSEU NEGATIVE 10/23/2022 1041   HGBUR NEGATIVE 10/23/2022 1041   BILIRUBINUR NEGATIVE 10/23/2022 1041   KETONESUR NEGATIVE 10/23/2022 1041   PROTEINUR NEGATIVE 10/23/2022 1041   UROBILINOGEN 1.0 08/04/2013 0427   NITRITE NEGATIVE 10/23/2022 1041   LEUKOCYTESUR NEGATIVE 10/23/2022 1041   Sepsis Labs Recent Labs  Lab 10/23/22 1130 10/23/22 2217  WBC 7.8 10.8*   Microbiology No results found for this or any previous visit (from the past 240 hour(s)).   Time coordinating discharge:  28 minutes  SIGNED:   Dorcas Carrow, MD  [facility] 10/25/2022, 9:58 AM

## 2022-10-25 NOTE — Progress Notes (Signed)
Patient requested bus pass and help obtaining discharge medications from case management.  Writer removed IV and advised patient she would return once bus pass was received and discuss discharge paper work. Writer entered room approximately 30 minutes later and patient is not in room and has not been seen by staff. All patients belonging are gone. Patient left hospital without receiving discharge instructions.

## 2022-10-30 ENCOUNTER — Encounter (HOSPITAL_COMMUNITY): Payer: Self-pay

## 2022-10-30 ENCOUNTER — Emergency Department (HOSPITAL_COMMUNITY)
Admission: EM | Admit: 2022-10-30 | Discharge: 2022-10-31 | Disposition: A | Discharge status: Home / Self Care | Payer: Medicare PPO | Attending: Emergency Medicine | Admitting: Emergency Medicine

## 2022-10-30 DIAGNOSIS — F1020 Alcohol dependence, uncomplicated: Secondary | ICD-10-CM | POA: Diagnosis not present

## 2022-10-30 DIAGNOSIS — F1090 Alcohol use, unspecified, uncomplicated: Secondary | ICD-10-CM

## 2022-10-30 DIAGNOSIS — R45851 Suicidal ideations: Secondary | ICD-10-CM

## 2022-10-30 DIAGNOSIS — E871 Hypo-osmolality and hyponatremia: Secondary | ICD-10-CM | POA: Diagnosis not present

## 2022-10-30 DIAGNOSIS — E876 Hypokalemia: Secondary | ICD-10-CM | POA: Diagnosis not present

## 2022-10-30 DIAGNOSIS — F10129 Alcohol abuse with intoxication, unspecified: Secondary | ICD-10-CM | POA: Diagnosis not present

## 2022-10-30 DIAGNOSIS — Z1152 Encounter for screening for COVID-19: Secondary | ICD-10-CM | POA: Diagnosis not present

## 2022-10-30 DIAGNOSIS — Y906 Blood alcohol level of 120-199 mg/100 ml: Secondary | ICD-10-CM | POA: Insufficient documentation

## 2022-10-30 DIAGNOSIS — F32A Depression, unspecified: Secondary | ICD-10-CM | POA: Insufficient documentation

## 2022-10-30 DIAGNOSIS — F1010 Alcohol abuse, uncomplicated: Secondary | ICD-10-CM | POA: Diagnosis not present

## 2022-10-30 DIAGNOSIS — F10920 Alcohol use, unspecified with intoxication, uncomplicated: Secondary | ICD-10-CM

## 2022-10-30 LAB — CBC
HCT: 27.8 % — ABNORMAL LOW (ref 39.0–52.0)
Hemoglobin: 9.5 g/dL — ABNORMAL LOW (ref 13.0–17.0)
MCH: 33.6 pg (ref 26.0–34.0)
MCHC: 34.2 g/dL (ref 30.0–36.0)
MCV: 98.2 fL (ref 80.0–100.0)
Platelets: 403 10*3/uL — ABNORMAL HIGH (ref 150–400)
RBC: 2.83 MIL/uL — ABNORMAL LOW (ref 4.22–5.81)
RDW: 12.3 % (ref 11.5–15.5)
WBC: 6.4 10*3/uL (ref 4.0–10.5)
nRBC: 0 % (ref 0.0–0.2)

## 2022-10-30 LAB — COMPREHENSIVE METABOLIC PANEL
ALT: 15 U/L (ref 0–44)
AST: 20 U/L (ref 15–41)
Albumin: 3.6 g/dL (ref 3.5–5.0)
Alkaline Phosphatase: 49 U/L (ref 38–126)
Anion gap: 9 (ref 5–15)
BUN: 15 mg/dL (ref 8–23)
CO2: 22 mmol/L (ref 22–32)
Calcium: 8.3 mg/dL — ABNORMAL LOW (ref 8.9–10.3)
Chloride: 95 mmol/L — ABNORMAL LOW (ref 98–111)
Creatinine, Ser: 0.77 mg/dL (ref 0.61–1.24)
GFR, Estimated: >60 mL/min (ref >60)
Glucose, Bld: 90 mg/dL (ref 70–99)
Potassium: 3.2 mmol/L — ABNORMAL LOW (ref 3.5–5.1)
Sodium: 126 mmol/L — ABNORMAL LOW (ref 135–145)
Total Bilirubin: 0.3 mg/dL (ref 0.3–1.2)
Total Protein: 7.8 g/dL (ref 6.5–8.1)

## 2022-10-30 LAB — RAPID URINE DRUG SCREEN, HOSP PERFORMED
Amphetamines: NOT DETECTED
Barbiturates: NOT DETECTED
Benzodiazepines: NOT DETECTED
Cocaine: NOT DETECTED
Opiates: NOT DETECTED
Tetrahydrocannabinol: NOT DETECTED

## 2022-10-30 LAB — CBG MONITORING, ED: Glucose-Capillary: 79 mg/dL (ref 70–99)

## 2022-10-30 LAB — SALICYLATE LEVEL: Salicylate Lvl: 11.2 mg/dL (ref 7.0–30.0)

## 2022-10-30 LAB — ETHANOL: Alcohol, Ethyl (B): 114 mg/dL — ABNORMAL HIGH (ref <10)

## 2022-10-30 LAB — ACETAMINOPHEN LEVEL: Acetaminophen (Tylenol), Serum: <10 ug/mL — ABNORMAL LOW (ref 10–30)

## 2022-10-30 MED ORDER — POTASSIUM CHLORIDE CRYS ER 20 MEQ PO TBCR
40.0000 meq | EXTENDED_RELEASE_TABLET | Freq: Once | ORAL | Status: AC
  Administered 2022-10-30: 40 meq via ORAL
  Filled 2022-10-30: qty 2

## 2022-10-30 MED ORDER — ADULT MULTIVITAMIN W/MINERALS CH
1.0000 | ORAL_TABLET | Freq: Every day | ORAL | Status: DC
  Administered 2022-10-30 – 2022-10-31 (×2): 1 via ORAL
  Filled 2022-10-30 (×2): qty 1

## 2022-10-30 MED ORDER — THIAMINE MONONITRATE 100 MG PO TABS
100.0000 mg | ORAL_TABLET | Freq: Every day | ORAL | Status: DC
  Administered 2022-10-30 – 2022-10-31 (×2): 100 mg via ORAL
  Filled 2022-10-30 (×2): qty 1

## 2022-10-30 MED ORDER — FOLIC ACID 1 MG PO TABS
1.0000 mg | ORAL_TABLET | Freq: Every day | ORAL | Status: DC
  Administered 2022-10-30 – 2022-10-31 (×2): 1 mg via ORAL
  Filled 2022-10-30 (×2): qty 1

## 2022-10-30 MED ORDER — LORAZEPAM 1 MG PO TABS: 1.0000 mg | ORAL_TABLET | ORAL | Status: DC | PRN

## 2022-10-30 MED ORDER — THIAMINE HCL 100 MG/ML IJ SOLN: 100.0000 mg | Freq: Every day | INTRAMUSCULAR | Status: DC

## 2022-10-30 NOTE — Social Work (Addendum)
This CSW has added SUD resources for the patient. If new TOC issues arise please put in new consult.

## 2022-10-30 NOTE — ED Provider Notes (Signed)
[place] AT [facility] Provider Note   CSN: [id number] Arrival date & time: 10/30/22  1724     History  Chief Complaint  Patient presents with   Suicidal    Jimmy [name] is a 67 y.o. male with past medical history of bipolar disorder, schizophrenia, alcohol use disorder who presents to the ED complaining of suicidal ideation.  He states that he plans to cut himself with a plastic into his life.  States that he is not currently taking any medications as he cannot afford to get them.  Reports drinking wine heavily every day with last drink today.  Remote history of alcohol withdrawal seizures approximately 15 years ago.  Denies other substance misuse.  Denies auditory or visual hallucinations.  Denies homicidal or violent ideation.  Reports that he is willing to undergo psychiatric assessment and treatment.  Denies any acute medical complaints.    Home Medications No current medications  Allergies    Neosporin [neomycin-bacitracin zn-polymyx]    Review of Systems   Review of Systems  Physical Exam Updated Vital Signs BP (!) 141/88 (BP Location: Right Arm)   Pulse 78   Temp 98.2 F (36.8 C) (Oral)   Resp 18   SpO2 100%  Physical Exam Vitals and nursing note reviewed.  Constitutional:      General: He is not in acute distress.    Appearance: Normal appearance.  HENT:     Head: Normocephalic and atraumatic.     Mouth/Throat:     Mouth: Mucous membranes are moist.  Eyes:     Conjunctiva/sclera: Conjunctivae normal.  Cardiovascular:     Rate and Rhythm: Normal rate and regular rhythm.     Heart sounds: No murmur heard. Pulmonary:     Effort: Pulmonary effort is normal.     Breath sounds: Normal breath sounds.  Abdominal:     General: Abdomen is flat.     Palpations: Abdomen is soft.     Tenderness: There is no abdominal tenderness.  Musculoskeletal:        General: Normal range of motion.     Cervical back: Neck supple.      Right lower leg: No edema.     Left lower leg: No edema.  Skin:    General: Skin is warm and dry.     Capillary Refill: Capillary refill takes less than 2 seconds.  Neurological:     General: No focal deficit present.     Mental Status: He is alert and oriented to person, place, and time.     Comments: Slightly slurred speech, suspect secondary to acute alcohol intoxication  Psychiatric:        Mood and Affect: Mood is depressed. Affect is blunt and flat.        Behavior: Behavior is cooperative.        Thought Content: Thought content includes suicidal ideation. Thought content does not include homicidal ideation. Thought content includes suicidal plan. Thought content does not include homicidal plan.     Comments: Smells of alcohol     ED Results / Procedures / Treatments   Labs (all labs ordered are listed, but only abnormal results are displayed) Labs Reviewed  COMPREHENSIVE METABOLIC PANEL - Abnormal; Notable for the following components:      Result Value   Sodium 126 (*)    Potassium 3.2 (*)    Chloride 95 (*)    Calcium 8.3 (*)    All other components within normal  limits  ETHANOL - Abnormal; Notable for the following components:   Alcohol, Ethyl (B) 114 (*)    All other components within normal limits  ACETAMINOPHEN LEVEL - Abnormal; Notable for the following components:   Acetaminophen (Tylenol), Serum <10 (*)    All other components within normal limits  CBC - Abnormal; Notable for the following components:   RBC 2.83 (*)    Hemoglobin 9.5 (*)    HCT 27.8 (*)    Platelets 403 (*)    All other components within normal limits  SALICYLATE LEVEL  RAPID URINE DRUG SCREEN, HOSP PERFORMED  CBG MONITORING, ED    EKG None  Radiology No results found.  Procedures Procedures    Medications Ordered in ED Medications  LORazepam (ATIVAN) tablet 1-4 mg (has no administration in time range)    Or  LORazepam (ATIVAN) tablet 1 mg (has no administration in time  range)  thiamine (VITAMIN B1) tablet 100 mg (100 mg Oral Given 10/30/22 1849)    Or  thiamine (VITAMIN B1) injection 100 mg ( Intravenous See Alternative 10/30/22 1849)  folic acid (FOLVITE) tablet 1 mg (1 mg Oral Given 10/30/22 1849)  multivitamin with minerals tablet 1 tablet (1 tablet Oral Given 10/30/22 1849)  potassium chloride SA (KLOR-CON M) CR tablet 40 mEq (40 mEq Oral Given 10/30/22 2010)    ED Course/ Medical Decision Making/ A&P Clinical Course as of 10/30/22 2154  Thu Oct 30, 2022  1919 Patient medically cleared for psychiatric evaluation. [MG]  2114 Following psychiatric assessment, patient meets inpatient criteria and will seek placement.  Patient has remained calm, cooperative.  He is acutely intoxicated.  CIWA protocol is ordered.  Standing orders entered pending psych placement. [MG]    Clinical Course User Index [MG] Tonette Lederer, PA-C                                Medical Decision Making Amount and/or Complexity of Data Reviewed Labs: ordered. Decision-making details documented in ED Course. ECG/medicine tests: ordered. Decision-making details documented in ED Course.  Risk OTC drugs. Prescription drug management.   Medical Decision Making:   Jimmy [name] is a 67 y.o. male who presented to the ED today with suicidal ideation detailed above.    Patient's presentation is complicated by their history of bipolar disorder, AUD, schizophrenia.  Complete initial physical exam performed, notably the patient was expressing suicidal ideation with plan.  Neurologically intact apart from slightly slurred speech.  Smells of alcohol.    Reviewed and confirmed nursing documentation for past medical history, family history, social history.    Initial Assessment:   With the patient's presentation, differential diagnosis includes but is not limited to decompensated mood disorder, acute psychosis, alcohol intoxication, metabolic/infectious disturbance. This is most consistent with  an acute complicated illness  Initial Plan:  Screening labs including CBC and Metabolic panel to evaluate for infectious or metabolic etiology of disease.  UDS, ethanol, acetaminophen, salicylate levels to assess for substance misuse/overdose CBG to assess for hypoglycemia EKG to evaluate for cardiac pathology CIWA protocol ordered given suspected acute intoxication Objective evaluation as below reviewed   Initial Study Results:   Laboratory  All laboratory results reviewed without evidence of clinically relevant pathology.   Exceptions include: Sodium 126, potassium 3.2, chloride 95, alcohol 114  EKG EKG was reviewed independently.  Sinus rhythm, first-degree AV block, similar to previous, no STEMI.  Radiology:  All images  reviewed independently. Agree with radiology report at this time.   CT Head Wo Contrast  Result Date: 10/12/2022 CLINICAL DATA:  Blunt facial trauma. EXAM: CT HEAD WITHOUT CONTRAST CT MAXILLOFACIAL WITHOUT CONTRAST TECHNIQUE: Multidetector CT imaging of the head and maxillofacial structures were performed using the standard protocol without intravenous contrast. Multiplanar CT image reconstructions of the maxillofacial structures were also generated. RADIATION DOSE REDUCTION: This exam was performed according to the departmental dose-optimization program which includes automated exposure control, adjustment of the mA and/or kV according to patient size and/or use of iterative reconstruction technique. COMPARISON:  None Available. FINDINGS: CT HEAD FINDINGS Brain: No evidence of acute infarction, hemorrhage, hydrocephalus, extra-axial collection or mass lesion/mass effect. Vascular: No hyperdense vessel or unexpected calcification. Skull: Normal. Negative for fracture or focal lesion. Other: None. CT MAXILLOFACIAL FINDINGS Osseous: No fracture or mandibular dislocation. No destructive process. Orbits: Negative. No traumatic or inflammatory finding. Patient motion limits  evaluation. Sinuses: Clear. Soft tissues: No appreciable hematoma or large fluid collection, evaluation is somewhat limited due to patient motion. IMPRESSION: CT HEAD: No acute intracranial abnormality. CT MAXILLOFACIAL: 1. No acute facial bone fracture. 2. Patient motion limits evaluation. Electronically Signed   By: Larose Hires D.O.   On: 10/12/2022 21:37   CT Maxillofacial Wo Contrast  Result Date: 10/12/2022 CLINICAL DATA:  Blunt facial trauma. EXAM: CT HEAD WITHOUT CONTRAST CT MAXILLOFACIAL WITHOUT CONTRAST TECHNIQUE: Multidetector CT imaging of the head and maxillofacial structures were performed using the standard protocol without intravenous contrast. Multiplanar CT image reconstructions of the maxillofacial structures were also generated. RADIATION DOSE REDUCTION: This exam was performed according to the departmental dose-optimization program which includes automated exposure control, adjustment of the mA and/or kV according to patient size and/or use of iterative reconstruction technique. COMPARISON:  None Available. FINDINGS: CT HEAD FINDINGS Brain: No evidence of acute infarction, hemorrhage, hydrocephalus, extra-axial collection or mass lesion/mass effect. Vascular: No hyperdense vessel or unexpected calcification. Skull: Normal. Negative for fracture or focal lesion. Other: None. CT MAXILLOFACIAL FINDINGS Osseous: No fracture or mandibular dislocation. No destructive process. Orbits: Negative. No traumatic or inflammatory finding. Patient motion limits evaluation. Sinuses: Clear. Soft tissues: No appreciable hematoma or large fluid collection, evaluation is somewhat limited due to patient motion. IMPRESSION: CT HEAD: No acute intracranial abnormality. CT MAXILLOFACIAL: 1. No acute facial bone fracture. 2. Patient motion limits evaluation. Electronically Signed   By: Larose Hires D.O.   On: 10/12/2022 21:37      Consults: Case discussed with TTS who recommends inpatient admission.   Final  Assessment and Plan:   67 year old male with past medical history alcohol use disorder presents to the ED with active suicidal ideation.  Does appear acutely intoxicated.  Slightly hypertensive but otherwise vital signs reassuring.  No medical complaints.  CIWA protocol ordered.  Medical clearance labs placed.  Patient does have an alcohol 114, mild hyponatremia and hypokalemia.  Potassium repleted.  Suspect hyponatremia secondary to alcohol abuse.  UDS negative.  Otherwise labs unremarkable.  Patient was medically cleared for TTS consult and following consult they recommended inpatient admission.  Not currently on any home medications send no medication orders placed.  Patient has remained calm and cooperative throughout ED stay and is willing to seek psychiatric help.  No indication for IVC at this time.  Will remain as a border pending psychiatric placement.   Clinical Impression:  1. Suicidal ideation   2. Alcoholic intoxication without complication (HCC)   3. Alcohol use disorder   4. Hypokalemia  5. Hyponatremia      Data Unavailable           Final Clinical Impression(s) / ED Diagnoses Final diagnoses:  Suicidal ideation  Alcoholic intoxication without complication (HCC)  Alcohol use disorder  Hypokalemia  Hyponatremia    Rx / DC Orders ED Discharge Orders     None         Richardson Dopp 10/30/22 2158    Lorre Nick, MD 10/30/22 2252

## 2022-10-30 NOTE — ED Notes (Signed)
Pt changed into burgundy scrubs. Pt has 2 personal belonging's bag that contains: jeans, shirt, socks, shoes, coat, a bunch of coins, pack of cigs, and lighter. Bags LABELED and placed in cabinet 16-18. Security notified to wand
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
## 2022-10-30 NOTE — ED Triage Notes (Signed)
Pt presents with c/o suicidal ideation. Pt reports he has plans to cut himself with glass. Pt smells strongly of ETOH.

## 2022-10-30 NOTE — BH Assessment (Signed)
Clinician message Jari Sportsman, RN and Electa Sniff, RN: "Hey. It's Trey with TTS. Is the pt able to engage in the assessment, if so the pt will need to be placed in a private room. Is the pt under IVC? Also is the pt medically cleared?"   Clinician awaiting response.    Redmond Pulling, MS, [facility], [facility] Triage Specialist [phone]

## 2022-10-31 LAB — LIPASE, BLOOD: Lipase: 38 U/L (ref 11–51)

## 2022-10-31 LAB — SARS CORONAVIRUS 2 BY RT PCR: SARS Coronavirus 2 by RT PCR: NEGATIVE

## 2022-10-31 MED ORDER — SUCRALFATE 1 G PO TABS
1.0000 g | ORAL_TABLET | Freq: Once | ORAL | Status: AC
  Administered 2022-10-31: 1 g via ORAL
  Filled 2022-10-31: qty 1

## 2022-10-31 MED ORDER — PANTOPRAZOLE SODIUM 40 MG PO TBEC
40.0000 mg | DELAYED_RELEASE_TABLET | Freq: Every day | ORAL | Status: DC
  Administered 2022-10-31: 40 mg via ORAL
  Filled 2022-10-31: qty 1

## 2022-10-31 MED ORDER — PANTOPRAZOLE SODIUM 40 MG PO TBEC: 40.0000 mg | DELAYED_RELEASE_TABLET | Freq: Every day | ORAL | Status: DC

## 2022-10-31 MED ORDER — FAMOTIDINE 20 MG PO TABS
40.0000 mg | ORAL_TABLET | Freq: Once | ORAL | Status: AC
  Administered 2022-10-31: 40 mg via ORAL
  Filled 2022-10-31: qty 2

## 2022-10-31 MED ORDER — ALUM & MAG HYDROXIDE-SIMETH 200-200-20 MG/5ML PO SUSP
30.0000 mL | Freq: Once | ORAL | Status: AC
  Administered 2022-10-31: 30 mL via ORAL
  Filled 2022-10-31: qty 30

## 2022-10-31 NOTE — Progress Notes (Signed)
LCSW Progress Note  [id number]   Jimmy [name]  10/31/2022  1:18 AM    Inpatient Behavioral Health Placement  Pt meets inpatient criteria per Sindy Guadeloupe, NP. There are no available beds within [facility] BH system per [facility] Erica Wright,RN.[facility] AC. Referral was sent to the following facilities;   Destination  Service Provider Address Phone Fax  [facility]  [address] [place], [place] [address] [phone]  [facility] Patient Placement  [facility] [place], [place] [phone]  [facility]  [place] [address] [phone]  [facility]  [address] [place], [place] [address] [phone]  [facility]  [address] [place] [address] [phone]  [facility]  [address] [place] [address] [phone]  [facility]  [address] [place] [address] [phone]  [facility]  [address], [place] [address] [phone]  [facility]  [address] [place] [address] [phone]  [facility]  [address] [place] [address] [phone]  [facility]  [address], [place] [address] [phone]  [facility]  [place] [phone]  [facility]  [address] [place] [address] [phone]  Renella Cunas  [place] -- [phone]  [facility]  [address], [place] [address] [phone]  [facility]  [address], [place] [address] [phone]  [facility]  [address], [place] [address] [phone]  [facility]  [address] [place], [place] [address] [phone]  [facility]  [address], [place] [address] [phone]  [facility] Inpatient [facility] [place]  [place] [phone]  [facility]  [address] [place] [address] [phone]  [facility]  [address] [place] [address] [phone]  [facility]  [address] [place] [address] [phone]  [facility]  [address] [place] [address] [phone]  [facility] [address]  [address] [place] [address] [phone]  [facility]  [address], [place] [address] [phone]  [facility]  [address] [place] [address] [phone]  [facility]  [address] [place] [address] [phone]  [facility]  [address], [place] [address] [phone]    Situation ongoing,  CSW will follow up.    Maryjean Ka, MSW, LCSWA 10/31/2022 1:18 AM

## 2022-10-31 NOTE — ED Provider Notes (Signed)
Emergency Medicine Observation Re-evaluation Note  Jimmy [name] is a 67 y.o. male, seen on rounds today.  Pt initially presented to the ED for complaints of Suicidal Currently, the patient is resting comfortably.  Physical Exam  BP (!) 135/54 (BP Location: Left Arm)   Pulse 91   Temp 98.2 F (36.8 C) (Oral)   Resp 18   SpO2 98%  Physical Exam General: No acute distress Cardiac: Regular rate Lungs: No respiratory distress Psych: Currently calm  ED Course / MDM  EKG:   I have reviewed the labs performed to date as well as medications administered while in observation.  Recent changes in the last 24 hours include -patient was seen by psychiatry team.  Patient has history of bipolar disorder, schizophrenia.  He was complaining of suicidal ideation.  Plan  Current plan is for patient to be transferred to [facility].  He has been accepted by psychiatry team there.  Patient made aware of the transfer plan and location.    Derwood Kaplan, MD 10/31/22 [phone]

## 2022-10-31 NOTE — Progress Notes (Signed)
Pt was accepted to [facility] TODAY;10/31/2022; Bed Assignment Cheron Every  Pt meets inpatient criteria per Lavonna Monarch  Attending Physician will be Dr. Rush Farmer. [place]  Report can be called to: - [phone]  Pt can arrive after 9:00am  Care Team notified:Night [facility] Shela Nevin Laurena Spies Stover,L[facility], Mariel Sleet, Sheran Lawless [place], [name] 10/31/2022 @ 2:03 AM

## 2022-10-31 NOTE — ED Notes (Addendum)
Pt c/o sharp middle abd pain to this RN at 0130. Dr. Madilyn Hook notified. Orders obtained. Blood sent to lab and medicated per MAR.

## 2022-10-31 NOTE — ED Notes (Addendum)
Safe transport called for transport to [facility].

## 2022-11-13 ENCOUNTER — Other Ambulatory Visit: Payer: Self-pay

## 2022-11-13 ENCOUNTER — Emergency Department (HOSPITAL_COMMUNITY): Payer: Medicare PPO

## 2022-11-13 ENCOUNTER — Inpatient Hospital Stay (HOSPITAL_COMMUNITY)
Admission: EM | Admit: 2022-11-13 | Discharge: 2022-11-13 | DRG: 379 | Discharge status: Against Medical Advice | Payer: Medicare PPO | Attending: Internal Medicine | Admitting: Internal Medicine

## 2022-11-13 DIAGNOSIS — F209 Schizophrenia, unspecified: Secondary | ICD-10-CM | POA: Diagnosis present

## 2022-11-13 DIAGNOSIS — K922 Gastrointestinal hemorrhage, unspecified: Secondary | ICD-10-CM | POA: Diagnosis present

## 2022-11-13 DIAGNOSIS — Z5941 Food insecurity: Secondary | ICD-10-CM

## 2022-11-13 DIAGNOSIS — Z9049 Acquired absence of other specified parts of digestive tract: Secondary | ICD-10-CM | POA: Diagnosis not present

## 2022-11-13 DIAGNOSIS — F17210 Nicotine dependence, cigarettes, uncomplicated: Secondary | ICD-10-CM | POA: Diagnosis present

## 2022-11-13 DIAGNOSIS — R112 Nausea with vomiting, unspecified: Secondary | ICD-10-CM | POA: Diagnosis present

## 2022-11-13 DIAGNOSIS — Z881 Allergy status to other antibiotic agents status: Secondary | ICD-10-CM | POA: Diagnosis not present

## 2022-11-13 DIAGNOSIS — I959 Hypotension, unspecified: Secondary | ICD-10-CM | POA: Diagnosis present

## 2022-11-13 DIAGNOSIS — E871 Hypo-osmolality and hyponatremia: Secondary | ICD-10-CM | POA: Diagnosis present

## 2022-11-13 DIAGNOSIS — Z5982 Transportation insecurity: Secondary | ICD-10-CM | POA: Diagnosis not present

## 2022-11-13 DIAGNOSIS — F1010 Alcohol abuse, uncomplicated: Secondary | ICD-10-CM | POA: Diagnosis present

## 2022-11-13 DIAGNOSIS — Z811 Family history of alcohol abuse and dependence: Secondary | ICD-10-CM | POA: Diagnosis not present

## 2022-11-13 DIAGNOSIS — Z5329 Procedure and treatment not carried out because of patient's decision for other reasons: Secondary | ICD-10-CM | POA: Diagnosis present

## 2022-11-13 DIAGNOSIS — F319 Bipolar disorder, unspecified: Secondary | ICD-10-CM | POA: Diagnosis present

## 2022-11-13 DIAGNOSIS — Z841 Family history of disorders of kidney and ureter: Secondary | ICD-10-CM | POA: Diagnosis not present

## 2022-11-13 DIAGNOSIS — K254 Chronic or unspecified gastric ulcer with hemorrhage: Secondary | ICD-10-CM | POA: Diagnosis present

## 2022-11-13 LAB — PREPARE RBC (CROSSMATCH)

## 2022-11-13 LAB — COMPREHENSIVE METABOLIC PANEL
ALT: 15 U/L (ref 0–44)
AST: 18 U/L (ref 15–41)
Albumin: 3.1 g/dL — ABNORMAL LOW (ref 3.5–5.0)
Alkaline Phosphatase: 43 U/L (ref 38–126)
Anion gap: 15 (ref 5–15)
BUN: 44 mg/dL — ABNORMAL HIGH (ref 8–23)
CO2: 25 mmol/L (ref 22–32)
Calcium: 9.9 mg/dL (ref 8.9–10.3)
Chloride: 99 mmol/L (ref 98–111)
Creatinine, Ser: 1.15 mg/dL (ref 0.61–1.24)
GFR, Estimated: >60 mL/min (ref >60)
Glucose, Bld: 143 mg/dL — ABNORMAL HIGH (ref 70–99)
Potassium: 3.5 mmol/L (ref 3.5–5.1)
Sodium: 139 mmol/L (ref 135–145)
Total Bilirubin: 0.4 mg/dL (ref 0.3–1.2)
Total Protein: 6.3 g/dL — ABNORMAL LOW (ref 6.5–8.1)

## 2022-11-13 LAB — CBC WITH DIFFERENTIAL/PLATELET
Abs Immature Granulocytes: 0.05 10*3/uL (ref 0.00–0.07)
Basophils Absolute: 0.1 10*3/uL (ref 0.0–0.1)
Basophils Relative: 1 %
Eosinophils Absolute: 0.6 10*3/uL — ABNORMAL HIGH (ref 0.0–0.5)
Eosinophils Relative: 6 %
HCT: 24.5 % — ABNORMAL LOW (ref 39.0–52.0)
Hemoglobin: 7.9 g/dL — ABNORMAL LOW (ref 13.0–17.0)
Immature Granulocytes: 1 %
Lymphocytes Relative: 31 %
Lymphs Abs: 3 10*3/uL (ref 0.7–4.0)
MCH: 32.8 pg (ref 26.0–34.0)
MCHC: 32.2 g/dL (ref 30.0–36.0)
MCV: 101.7 fL — ABNORMAL HIGH (ref 80.0–100.0)
Monocytes Absolute: 0.8 10*3/uL (ref 0.1–1.0)
Monocytes Relative: 8 %
Neutro Abs: 5.2 10*3/uL (ref 1.7–7.7)
Neutrophils Relative %: 53 %
Platelets: 341 10*3/uL (ref 150–400)
RBC: 2.41 MIL/uL — ABNORMAL LOW (ref 4.22–5.81)
RDW: 12.3 % (ref 11.5–15.5)
WBC: 9.7 10*3/uL (ref 4.0–10.5)
nRBC: 0 % (ref 0.0–0.2)

## 2022-11-13 LAB — LIPASE, BLOOD: Lipase: 33 U/L (ref 11–51)

## 2022-11-13 LAB — AMMONIA: Ammonia: 38 umol/L — ABNORMAL HIGH (ref 9–35)

## 2022-11-13 LAB — PROTIME-INR
INR: 1.1 (ref 0.8–1.2)
Prothrombin Time: 13.9 seconds (ref 11.4–15.2)

## 2022-11-13 LAB — ETHANOL: Alcohol, Ethyl (B): <10 mg/dL (ref <10)

## 2022-11-13 MED ORDER — PANTOPRAZOLE INFUSION (NEW) - SIMPLE MED
8.0000 mg/h | INTRAVENOUS | Status: DC
  Filled 2022-11-13: qty 100

## 2022-11-13 MED ORDER — PANTOPRAZOLE 80MG IVPB - SIMPLE MED
80.0000 mg | Freq: Once | INTRAVENOUS | Status: AC
  Administered 2022-11-13: 80 mg via INTRAVENOUS
  Filled 2022-11-13: qty 80

## 2022-11-13 MED ORDER — SODIUM CHLORIDE 0.9 % IV BOLUS
1000.0000 mL | Freq: Once | INTRAVENOUS | Status: AC
  Administered 2022-11-13: 1000 mL via INTRAVENOUS

## 2022-11-13 MED ORDER — SODIUM CHLORIDE 0.9% IV SOLUTION: Freq: Once | INTRAVENOUS | Status: DC

## 2022-11-13 MED ORDER — SODIUM CHLORIDE 0.9 % IV SOLN
50.0000 ug/h | INTRAVENOUS | Status: DC
  Administered 2022-11-13: 50 ug/h via INTRAVENOUS
  Filled 2022-11-13: qty 1

## 2022-11-13 MED ORDER — OCTREOTIDE LOAD VIA INFUSION
50.0000 ug | Freq: Once | INTRAVENOUS | Status: DC
  Filled 2022-11-13: qty 25

## 2022-11-13 MED ORDER — SODIUM CHLORIDE 0.9 % IV SOLN: INTRAVENOUS | Status: DC

## 2022-11-13 MED ORDER — SODIUM CHLORIDE 0.9 % IV SOLN: 1.0000 g | Freq: Once | INTRAVENOUS | Status: DC

## 2022-11-13 NOTE — ED Notes (Signed)
Pt refused NG tube stated " I don't want nothing up my nose, I don't want any of this shit on me. I just want some water or a coke" Pt proceeded to take off pulse ox. Explained to pt that he couldn't have anything to eat or drink in case he had to have any type of procedures.

## 2022-11-13 NOTE — Progress Notes (Signed)
[facility] admitting team:  The patient signed AMA shortly after the EDP presented the case and room request made.  Dr. Clarice Pole discussed the risks of leaving AMA.  The patient left despite all these risks.  The admission process was canceled.  Sanda Klein, MD

## 2022-11-13 NOTE — ED Notes (Signed)
Pt called out requesting something to eat and drink explained to patient that he can't have anything at the moment. Pt began screaming and cussing stating he wanted to leave.

## 2022-11-13 NOTE — ED Provider Notes (Addendum)
[place] EMERGENCY DEPARTMENT AT [facility] Provider Note   CSN: [id number] Arrival date & time: 11/13/22  5784     History  Chief Complaint  Patient presents with   Emesis    Jimmy [name] is a 67 y.o. male.  HPI Patient was recently admitted to the hospital 8\1\2024.  That hospitalization was for acute kidney injury in the setting of chronic alcohol use with chronic hyponatremia with vomiting and heat exposure.  Last documented upper endoscopy 6\2022 which showed esophagitis and actively bleeding gastric ulcer but no mention of varices.  Patient brought by EMS today with report of hematemesis.  EMS report patient was sitting in about 500 cc of coffee-ground blood and was hypotensive.  Patient was given 300 mL normal saline and 4 mg Zofran.  Patient reports he is having epigastric pain.  He reports he is also been having dark looking diarrheal stool.  Patient reports he drinks daily.  He is not sure how much he drinks.  He drinks beer all day.  He reports epigastric pain has been going on since and before his more recent hospitalization.  He reports he felt lightheaded previously before EMS treatment.  At this time he reports he does feel somewhat improved but still has epigastric pain.     Home Medications Prior to Admission medications   Medication Sig Start Date End Date Taking? Authorizing Provider  famotidine (PEPCID) 20 MG tablet Take 1 tablet (20 mg total) by mouth 2 (two) times daily. 10/13/18 10/19/18  Arby Barrette, MD      Allergies    Neosporin [neomycin-bacitracin zn-polymyx]    Review of Systems   Review of Systems  Physical Exam Updated Vital Signs BP 91/66   Pulse 92   Temp 97.8 F (36.6 C)   Resp 17   Ht 5\' 8"  (1.727 m)   Wt 76 kg   SpO2 96%   BMI 25.48 kg/m  Physical Exam Constitutional:      Comments: Patient is alert.  Deeply tanned.  But is not pale in appearance.  No respiratory distress.  Smells of alcohol.  Mental status is  situationally appropriate.  He is not somnolent.  HENT:     Mouth/Throat:     Mouth: Mucous membranes are moist.     Pharynx: Oropharynx is clear.     Comments: Very poor dentition.  Airway is clear and patent.  No secretions.  There are some brownish blood staining on his beard but no blood in the oral cavity. Eyes:     Extraocular Movements: Extraocular movements intact.  Cardiovascular:     Rate and Rhythm: Regular rhythm. Tachycardia present.     Comments: Patient has easily palpable distal pulses radial and pedal.  The extremities are warm and dry to the touch. Pulmonary:     Comments: No respiratory distress.  Symmetric breath sounds.  Expiratory wheeze present. Abdominal:     Comments: Abdomen soft without guarding.  No significant distention.  Epigastrium is tender.  Musculoskeletal:     Comments: No peripheral edema.  Calves are soft and nontender.  Feet are very dirty but general condition is good without appearance of active wounds or ulcers.  Skin:    General: Skin is warm and dry.  Neurological:     Comments: Patient is alert he can follow commands.  No focal motor deficits.     ED Results / Procedures / Treatments   Labs (all labs ordered are listed, but only abnormal results  are displayed) Labs Reviewed  COMPREHENSIVE METABOLIC PANEL - Abnormal; Notable for the following components:      Result Value   Glucose, Bld 143 (*)    BUN 44 (*)    Total Protein 6.3 (*)    Albumin 3.1 (*)    All other components within normal limits  CBC WITH DIFFERENTIAL/PLATELET - Abnormal; Notable for the following components:   RBC 2.41 (*)    Hemoglobin 7.9 (*)    HCT 24.5 (*)    MCV 101.7 (*)    Eosinophils Absolute 0.6 (*)    All other components within normal limits  AMMONIA - Abnormal; Notable for the following components:   Ammonia 38 (*)    All other components within normal limits  LIPASE, BLOOD  PROTIME-INR  ETHANOL  URINALYSIS, ROUTINE W REFLEX MICROSCOPIC  RAPID  URINE DRUG SCREEN, HOSP PERFORMED  I-STAT CHEM 8, ED  POC OCCULT BLOOD, ED  TYPE AND SCREEN  PREPARE RBC (CROSSMATCH)  TYPE AND SCREEN  PREPARE RBC (CROSSMATCH)    EKG EKG Interpretation Date/Time:  Thursday November 13 2022 10:43:42 EDT Ventricular Rate:  103 PR Interval:  176 QRS Duration:  117 QT Interval:  361 QTC Calculation: 473 R Axis:   61  Text Interpretation: Sinus tachycardia Incomplete right bundle branch block no sig change from previous Confirmed by Arby Barrette [phone]) on 11/13/2022 12:43:16 PM  Radiology DG Chest Port 1 View  Result Date: 11/13/2022 CLINICAL DATA:  Coffee-ground emesis EXAM: PORTABLE CHEST 1 VIEW COMPARISON:  Chest radiograph dated 07/27/2022 FINDINGS: Normal lung volumes. Patchy left basilar opacities. No pleural effusion or pneumothorax. The heart size and mediastinal contours are within normal limits. No acute osseous abnormality. IMPRESSION: Patchy left basilar opacities, which may represent atelectasis, aspiration, or pneumonia. Electronically Signed   By: Agustin Cree M.D.   On: 11/13/2022 10:44    Procedures Procedures   CRITICAL CARE Performed by: Arby Barrette   Total critical care time: 45 minutes  Critical care time was exclusive of separately billable procedures and treating other patients.  Critical care was necessary to treat or prevent imminent or life-threatening deterioration.  Critical care was time spent personally by me on the following activities: development of treatment plan with patient and/or surrogate as well as nursing, discussions with consultants, evaluation of patient's response to treatment, examination of patient, obtaining history from patient or surrogate, ordering and performing treatments and interventions, ordering and review of laboratory studies, ordering and review of radiographic studies, pulse oximetry and re-evaluation of patient's condition.  Medications Ordered in ED Medications  sodium chloride 0.9  % bolus 1,000 mL (1,000 mLs Intravenous New Bag/Given 11/13/22 1014)    And  0.9 %  sodium chloride infusion ( Intravenous New Bag/Given 11/13/22 1024)  octreotide (SANDOSTATIN) 2 mcg/mL load via infusion 50 mcg (has no administration in time range)    And  octreotide (SANDOSTATIN) 500 mcg in sodium chloride 0.9 % 250 mL (2 mcg/mL) infusion (50 mcg/hr Intravenous New Bag/Given 11/13/22 1105)  pantoprozole (PROTONIX) 80 mg /NS 100 mL infusion (has no administration in time range)  0.9 %  sodium chloride infusion (Manually program via Guardrails IV Fluids) (has no administration in time range)  cefTRIAXone (ROCEPHIN) 1 g in sodium chloride 0.9 % 100 mL IVPB (has no administration in time range)  0.9 %  sodium chloride infusion (Manually program via Guardrails IV Fluids) (has no administration in time range)  pantoprazole (PROTONIX) 80 mg /NS 100 mL IVPB (80 mg Intravenous New  Bag/Given 11/13/22 1027)    ED Course/ Medical Decision Making/ A&P                                 Medical Decision Making Amount and/or Complexity of Data Reviewed Labs: ordered. Radiology: ordered.  Risk Prescription drug management. Decision regarding hospitalization.   Dr. Lavon Paganini did upper endoscopy [id number] for acute GI bleed.  Patient had extensive esophagitis and a actively bleeding gastric ulcer.  No mention of varices in 09/10/2020 report.  During initial assessment, blood pressure readings 70s.  Combined with EMS report, consistent with acute GI bleed setting of known peptic ulcer.  Patient is not having any active vomiting at this time and does not endorse nausea.  Will initiate full protocol for upper GI bleed including Protonix bolus and drip and Sandostatin administration.  Patient comorbid condition is chronic alcoholism.  At this time his mental status is clear and he is protecting his airway without difficulty.  10:31 with patient supine and fluids infusing, blood pressure is back up to 92 systolic.   Although blood pressures read 70s/50s machine read, at that time patient had warm dry extremities with good peripheral pulses both pedal and radial.  Currently patient's color is good, repeat systolic pressure is 92/65.  He is alert no distress.  Patient denies he has any ongoing nausea.  Some persistent epigastric discomfort but not severe pain.  He is in agreement with excepting any blood transfusion or procedure necessary.  At this time patient is comfortable, requesting to watch television and something to eat, he is advised he cannot have anything to eat or drink until completion of treatment and evaluation.  10: 54 Callback from [facility], although patient had upper endoscopy in 2022, he is not a clinic patient and will be unassigned GI.  Consult placed for [facility] for unassigned GI. 11: 19 consult Dr. Hinda Lenis GI.  Requests placement of NG tube for assistance and diagnostic upper GI bleed.  Will consult on patient in the emergency department. Consult: Dr. Robb Matar for admission to [facility]. Patient's blood pressures have not been consistently 90s to 100 systolic.  He has been seen by gastroenterology.  At this time not going to do an emergent upper endoscopy.  Patient's mental status and respiratory status remained stable.  Plan for admission.  At 12: 50, patient advised that he is leaving AGAINST MEDICAL ADVICE.  He is angry because he has not been able to have anything to eat he wants to go outside of property to smoke.  He reports he wants to get a Pepsi and  go up to the top of the hill off of hospital property, and smoke some cigarettes.  Explained to the patient that if he needs an emergent upper endoscopy because he is having active bleeding he cannot have food in his stomach.  He has been given ice chips to help with dry mouth.  Patient is alert with very clear mental status.  Movements are brisk and coordinated.  He has gotten up and walked around he is dressing himself in a very  coordinated brisk fashion.  His speech is clear.  There is no signs of cognitive impairment at this time.  Spent significant time discussing that he very well may die if he has another burst of bleeding from the gastric ulcer.  He reports he does not care if he dies in the back of the white [address]  or a bus, he wants to have his beer, smoke cigarettes and eat what he wants.  Of Note, patient had refused NG tube prior to expressing his plan to leave AMA.  I did not pressure him about the NG tube, although it was strongly requested by GI. We tried allowing ice chips, but concessions and detailed explanations the severity of his medical condition and possible outcomes did not change his mind about leaving AMA.      Final Clinical Impression(s) / ED Diagnoses Final diagnoses:  Acute upper GI bleeding  Chronic alcohol abuse    Rx / DC Orders ED Discharge Orders     None         Arby Barrette, MD 11/13/22 1245    Arby Barrette, MD 11/13/22 1258    Arby Barrette, MD 11/13/22 1302

## 2022-11-13 NOTE — ED Triage Notes (Signed)
Pt BIB EMS c/o diarrhea "dark stool" EMS reports "coffee ground emesis" on scene  4mg  Zofran iv given en route 16ga right forearm Cbg 190

## 2022-11-13 NOTE — ED Notes (Signed)
Completed ortho VS, pt refused to stand. Pt states "I feel very light headed" as he was in a sitting position on the side of the bed
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
## 2022-11-13 NOTE — ED Notes (Signed)
Pt signed AMA form, Dr.Pfeiffer at bedside explained risks of leaving AMA. Pt stated "I've lived a long life already. I just want to go up the hill smoke me a cigarette and get me a cold Pepsi."

## 2022-11-14 LAB — BPAM RBC
Blood Product Expiration Date: 202409132359
ISSUE DATE / TIME: 202408221032
Unit Type and Rh: 9500

## 2022-11-14 LAB — TYPE AND SCREEN
ABO/RH(D): O POS
Antibody Screen: NEGATIVE
Unit division: 0

## 2022-11-14 SURGERY — ESOPHAGOGASTRODUODENOSCOPY (EGD) WITH PROPOFOL
Anesthesia: Monitor Anesthesia Care | Laterality: Left

## 2023-04-05 ENCOUNTER — Emergency Department (HOSPITAL_COMMUNITY)
Admission: EM | Admit: 2023-04-05 | Discharge: 2023-04-06 | Disposition: A | Discharge status: Home / Self Care | Payer: Medicare Other | Attending: Emergency Medicine | Admitting: Emergency Medicine

## 2023-04-05 ENCOUNTER — Encounter (HOSPITAL_COMMUNITY): Payer: Self-pay | Admitting: Emergency Medicine

## 2023-04-05 DIAGNOSIS — Z5900 Homelessness unspecified: Secondary | ICD-10-CM | POA: Diagnosis not present

## 2023-04-05 DIAGNOSIS — F29 Unspecified psychosis not due to a substance or known physiological condition: Secondary | ICD-10-CM | POA: Insufficient documentation

## 2023-04-05 DIAGNOSIS — F1094 Alcohol use, unspecified with alcohol-induced mood disorder: Secondary | ICD-10-CM | POA: Insufficient documentation

## 2023-04-05 DIAGNOSIS — Y901 Blood alcohol level of 20-39 mg/100 ml: Secondary | ICD-10-CM | POA: Diagnosis not present

## 2023-04-05 DIAGNOSIS — R45851 Suicidal ideations: Secondary | ICD-10-CM | POA: Insufficient documentation

## 2023-04-05 DIAGNOSIS — F1099 Alcohol use, unspecified with unspecified alcohol-induced disorder: Secondary | ICD-10-CM | POA: Diagnosis present

## 2023-04-05 DIAGNOSIS — Z79899 Other long term (current) drug therapy: Secondary | ICD-10-CM | POA: Insufficient documentation

## 2023-04-05 NOTE — ED Triage Notes (Signed)
 Pt here from the streets stating that he is suicidal

## 2023-04-05 NOTE — ED Provider Notes (Signed)
 [place] EMERGENCY DEPARTMENT AT [facility] Provider Note   CSN: [id number] Arrival date & time: 04/05/23  2202     History  No chief complaint on file.  HPI Jimmy [name] is a 68 y.o. male with history of bipolar 1, alcohol  and polysubstance abuse, homelessness presenting for SI.  States has been having suicidal ideation for the past couple days.  States he does not have a definitive plan but has been thinking about using pieces of glass to cut his wrist.  Denies homicidal ideation and hallucinations.  Denies any other medical complaints.  States he is homeless at this time and is requesting something to be.  HPI     Home Medications Prior to Admission medications   Medication Sig Start Date End Date Taking? Authorizing Provider  famotidine  (PEPCID ) 20 MG tablet Take 1 tablet (20 mg total) by mouth 2 (two) times daily. 10/13/18 10/19/18  Armenta Canning, MD      Allergies    Neosporin [neomycin -bacitracin  zn-polymyx]    Review of Systems   See HPI for pertinent positives  Physical Exam Updated Vital Signs BP (!) 144/78   Pulse 83   Temp 97.9 F (36.6 C) (Oral)   Resp 16   Ht 5' 8 (1.727 m)   Wt 71.2 kg   SpO2 100%   BMI 23.87 kg/m  Physical Exam Vitals and nursing note reviewed.  HENT:     Head: Normocephalic and atraumatic.     Mouth/Throat:     Mouth: Mucous membranes are moist.  Eyes:     General:        Right eye: No discharge.        Left eye: No discharge.     Conjunctiva/sclera: Conjunctivae normal.  Cardiovascular:     Rate and Rhythm: Normal rate and regular rhythm.     Pulses: Normal pulses.     Heart sounds: Normal heart sounds.  Pulmonary:     Effort: Pulmonary effort is normal.     Breath sounds: Normal breath sounds.  Abdominal:     General: Abdomen is flat.     Palpations: Abdomen is soft.  Skin:    General: Skin is warm and dry.  Neurological:     General: No focal deficit present.  Psychiatric:        Mood and  Affect: Mood normal.     ED Results / Procedures / Treatments   Labs (all labs ordered are listed, but only abnormal results are displayed) Labs Reviewed  COMPREHENSIVE METABOLIC PANEL - Abnormal; Notable for the following components:      Result Value   Sodium 128 (*)    Chloride 95 (*)    CO2 21 (*)    Total Protein 8.2 (*)    All other components within normal limits  ETHANOL - Abnormal; Notable for the following components:   Alcohol , Ethyl (B) 29 (*)    All other components within normal limits  SALICYLATE LEVEL - Abnormal; Notable for the following components:   Salicylate Lvl <7.0 (*)    All other components within normal limits  ACETAMINOPHEN  LEVEL - Abnormal; Notable for the following components:   Acetaminophen  (Tylenol ), Serum <10 (*)    All other components within normal limits  CBC  RAPID URINE DRUG SCREEN, HOSP PERFORMED    EKG EKG Interpretation Date/Time:  Monday April 06 2023 04:42:27 EST Ventricular Rate:  96 PR Interval:  202 QRS Duration:  111 QT Interval:  338 QTC Calculation:  428 R Axis:   215  Text Interpretation: Sinus rhythm S1,S2,S3 pattern Confirmed by Griselda Norris [phone]) on 04/06/2023 4:44:24 AM  Radiology No results found.  Procedures Procedures    Medications Ordered in ED Medications - No data to display  ED Course/ Medical Decision Making/ A&P                                 Medical Decision Making Amount and/or Complexity of Data Reviewed Labs: ordered.   68 year old well-appearing male presenting for suicidal ideation with plan.  Exam is unremarkable.  Workup was reassuring and does not suggest acute psychosis.  Reviewed his chart and per last behavioral health assessment note patient has had a history of suicidal attempts and has expressed a desire in the past to cut his self in the wrist or throat with glass.  The last time he was here because of this they deemed him a high risk for suicidal attempt.  Given similar  presentation here I do feel it is warranted for further evaluation by psych.  He was effectively medically cleared here in the ED and now placed in psych hold awaiting TTS evaluation.  Patient is here voluntarily and has been cooperative and appropriate with staff, no harm to himself or others for this reason do not feel that IVC is appropriate at this time.        Final Clinical Impression(s) / ED Diagnoses Final diagnoses:  Suicidal ideation    Rx / DC Orders ED Discharge Orders     None         Lang Norleen POUR, PA-C 04/06/23 0453    Ula Prentice Vallejo, MD 04/06/23 2239

## 2023-04-06 DIAGNOSIS — Z5900 Homelessness unspecified: Secondary | ICD-10-CM

## 2023-04-06 DIAGNOSIS — R45851 Suicidal ideations: Secondary | ICD-10-CM

## 2023-04-06 DIAGNOSIS — F1094 Alcohol use, unspecified with alcohol-induced mood disorder: Secondary | ICD-10-CM

## 2023-04-06 LAB — ETHANOL: Alcohol, Ethyl (B): 29 mg/dL — ABNORMAL HIGH (ref <10)

## 2023-04-06 LAB — COMPREHENSIVE METABOLIC PANEL
ALT: 29 U/L (ref 0–44)
AST: 39 U/L (ref 15–41)
Albumin: 4.1 g/dL (ref 3.5–5.0)
Alkaline Phosphatase: 61 U/L (ref 38–126)
Anion gap: 12 (ref 5–15)
BUN: 8 mg/dL (ref 8–23)
CO2: 21 mmol/L — ABNORMAL LOW (ref 22–32)
Calcium: 9.2 mg/dL (ref 8.9–10.3)
Chloride: 95 mmol/L — ABNORMAL LOW (ref 98–111)
Creatinine, Ser: 0.78 mg/dL (ref 0.61–1.24)
GFR, Estimated: >60 mL/min (ref >60)
Glucose, Bld: 92 mg/dL (ref 70–99)
Potassium: 4.3 mmol/L (ref 3.5–5.1)
Sodium: 128 mmol/L — ABNORMAL LOW (ref 135–145)
Total Bilirubin: 0.5 mg/dL (ref 0.0–1.2)
Total Protein: 8.2 g/dL — ABNORMAL HIGH (ref 6.5–8.1)

## 2023-04-06 LAB — CBC
HCT: 37.3 % — ABNORMAL LOW (ref 39.0–52.0)
Hemoglobin: 13.1 g/dL (ref 13.0–17.0)
MCH: 31.3 pg (ref 26.0–34.0)
MCHC: 35.1 g/dL (ref 30.0–36.0)
MCV: 89 fL (ref 80.0–100.0)
Platelets: 336 10*3/uL (ref 150–400)
RBC: 4.19 MIL/uL — ABNORMAL LOW (ref 4.22–5.81)
RDW: 15.2 % (ref 11.5–15.5)
WBC: 5.8 10*3/uL (ref 4.0–10.5)
nRBC: 0 % (ref 0.0–0.2)

## 2023-04-06 LAB — ACETAMINOPHEN LEVEL: Acetaminophen (Tylenol), Serum: <10 ug/mL — ABNORMAL LOW (ref 10–30)

## 2023-04-06 LAB — RAPID URINE DRUG SCREEN, HOSP PERFORMED
Amphetamines: NOT DETECTED
Barbiturates: NOT DETECTED
Benzodiazepines: NOT DETECTED
Cocaine: NOT DETECTED
Opiates: NOT DETECTED
Tetrahydrocannabinol: NOT DETECTED

## 2023-04-06 LAB — SALICYLATE LEVEL: Salicylate Lvl: <7 mg/dL — ABNORMAL LOW (ref 7.0–30.0)

## 2023-04-06 NOTE — BH Assessment (Addendum)
 0530 attempted secure chat, no response, [place], RN unavailable. Will continue to make attempts for TTS consult.

## 2023-04-06 NOTE — Discharge Instructions (Signed)
 You were seen in the emergency department for your suicidal ideation.  You were seen by psychiatry and were cleared for outpatient management.  I have given you a list of shelters if needed for your homelessness and you can otherwise follow-up with your primary doctor or at the behavioral health urgent care as needed.  You should return to the emergency department if you are having any thoughts of wanting to hurt yourself or others or if you have any other new or concerning symptoms.

## 2023-04-06 NOTE — ED Provider Notes (Addendum)
 Emergency Medicine Observation Re-evaluation Note  Jimmy [name] is a 68 y.o. male, seen on rounds today.  Pt initially presented to the ED for complaints of No chief complaint on file. Currently, the patient is awake eating breakfast, no new complaints, reports he feels better this morning.  Physical Exam  BP (!) 144/78   Pulse 83   Temp 97.9 F (36.6 C) (Oral)   Resp 16   Ht 5' 8 (1.727 m)   Wt 71.2 kg   SpO2 100%   BMI 23.87 kg/m  Physical Exam General: Awake and alert, no acute distress Cardiac: Regular rate Lungs: No increased work of breathing Psych: Calm, cooperative  ED Course / MDM  EKG:EKG Interpretation Date/Time:  Monday April 06 2023 04:42:27 EST Ventricular Rate:  96 PR Interval:  202 QRS Duration:  111 QT Interval:  338 QTC Calculation: 428 R Axis:   215  Text Interpretation: Sinus rhythm S1,S2,S3 pattern Confirmed by Griselda Norris [phone]) on 04/06/2023 4:44:24 AM  I have reviewed the labs performed to date as well as medications administered while in observation.  Recent changes in the last 24 hours include patient medically cleared. Pending psych eval for disposition.  Plan  Current plan is for TTS for disposition.    10:52 AM Patient evaluated by psych and is cleared for discharge.    Kingsley, Alfard Cochrane K, DO 04/06/23 1052

## 2023-04-06 NOTE — ED Notes (Signed)
Pt was given breakfast tray 

## 2023-04-06 NOTE — ED Notes (Signed)
 Gave pt his belongings back and he walked to the bathroom to change into his clothes for discharge.
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
## 2023-04-06 NOTE — ED Notes (Signed)
 [facility] called pts payee Cherylin Daring) for collateral. Pts payee has been managing pts social security money for about 5 years. Pts payee reports that occasionally he allows pt to stay at his house and sometimes will arrange for a hotel room (pt is homeless). Pts payee reports that a plan to secure housing is in the works for the pt.   When asked why he manages pts money pts payee reports that pt would spend most of his money on alcohol , drugs and cigarettes. Per pt, his payee has his debt card, gives him $20 every so often but keeps the rest of pts money. Pt reports that he does not eat on a regular basis. Pt was encouraged to change his payee with social security to someone who is more trustworthy.   Pt does not like to stay at local shelters or access help through [facility]. Pt currently stays in a tent. Pt was provided with resources for free meals, outpatient mental heath and substance abuse treatment.   Chesley Holt, [facility]   04/06/23

## 2023-04-13 ENCOUNTER — Emergency Department (HOSPITAL_COMMUNITY)
Admission: EM | Admit: 2023-04-13 | Discharge: 2023-04-13 | Disposition: A | Discharge status: Home / Self Care | Payer: Medicare Other | Attending: Emergency Medicine | Admitting: Emergency Medicine

## 2023-04-13 ENCOUNTER — Other Ambulatory Visit: Payer: Self-pay

## 2023-04-13 ENCOUNTER — Encounter (HOSPITAL_COMMUNITY): Payer: Self-pay

## 2023-04-13 DIAGNOSIS — Y92410 Unspecified street and highway as the place of occurrence of the external cause: Secondary | ICD-10-CM | POA: Diagnosis not present

## 2023-04-13 DIAGNOSIS — M542 Cervicalgia: Secondary | ICD-10-CM | POA: Insufficient documentation

## 2023-04-13 MED ORDER — LIDOCAINE 5 % EX PTCH
1.0000 | MEDICATED_PATCH | CUTANEOUS | Status: DC
  Administered 2023-04-13: 1 via TRANSDERMAL
  Filled 2023-04-13: qty 1

## 2023-04-13 MED ORDER — KETOROLAC TROMETHAMINE 15 MG/ML IJ SOLN
15.0000 mg | Freq: Once | INTRAMUSCULAR | Status: AC
  Administered 2023-04-13: 15 mg via INTRAMUSCULAR
  Filled 2023-04-13: qty 1

## 2023-04-13 MED ORDER — ETODOLAC 400 MG PO TABS: 400.0000 mg | ORAL_TABLET | Freq: Two times a day (BID) | ORAL | 0 refills | Status: AC

## 2023-04-13 MED ORDER — LIDOCAINE 5 % EX PTCH: 1.0000 | MEDICATED_PATCH | CUTANEOUS | 0 refills | Status: AC
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
## 2023-04-13 NOTE — Discharge Instructions (Signed)
Your exam today was reassuring.  Have sent a couple medications into the pharmacy for you.  If you have any concerning symptoms return to the emergency room otherwise establish and follow-up with a primary care provider.

## 2023-08-04 ENCOUNTER — Emergency Department (HOSPITAL_COMMUNITY)
Admission: EM | Admit: 2023-08-04 | Discharge: 2023-08-04 | Disposition: A | Discharge status: Home / Self Care | Attending: Emergency Medicine | Admitting: Emergency Medicine

## 2023-08-04 ENCOUNTER — Other Ambulatory Visit (HOSPITAL_COMMUNITY): Payer: Self-pay

## 2023-08-04 ENCOUNTER — Other Ambulatory Visit: Payer: Self-pay

## 2023-08-04 DIAGNOSIS — M79641 Pain in right hand: Secondary | ICD-10-CM | POA: Insufficient documentation

## 2023-08-04 DIAGNOSIS — T69021A Immersion foot, right foot, initial encounter: Secondary | ICD-10-CM

## 2023-08-04 DIAGNOSIS — L853 Xerosis cutis: Secondary | ICD-10-CM

## 2023-08-04 DIAGNOSIS — M79672 Pain in left foot: Secondary | ICD-10-CM | POA: Insufficient documentation

## 2023-08-04 DIAGNOSIS — Z5900 Homelessness unspecified: Secondary | ICD-10-CM | POA: Diagnosis not present

## 2023-08-04 DIAGNOSIS — T69022A Immersion foot, left foot, initial encounter: Secondary | ICD-10-CM

## 2023-08-04 DIAGNOSIS — M79642 Pain in left hand: Secondary | ICD-10-CM | POA: Insufficient documentation

## 2023-08-04 DIAGNOSIS — M79671 Pain in right foot: Secondary | ICD-10-CM | POA: Insufficient documentation

## 2023-08-04 LAB — CBC WITH DIFFERENTIAL/PLATELET
Abs Immature Granulocytes: 0.04 10*3/uL (ref 0.00–0.07)
Basophils Absolute: 0.1 10*3/uL (ref 0.0–0.1)
Basophils Relative: 1 %
Eosinophils Absolute: 2.4 10*3/uL — ABNORMAL HIGH (ref 0.0–0.5)
Eosinophils Relative: 31 %
HCT: 42.1 % (ref 39.0–52.0)
Hemoglobin: 14 g/dL (ref 13.0–17.0)
Immature Granulocytes: 1 %
Lymphocytes Relative: 17 %
Lymphs Abs: 1.3 10*3/uL (ref 0.7–4.0)
MCH: 32.2 pg (ref 26.0–34.0)
MCHC: 33.3 g/dL (ref 30.0–36.0)
MCV: 96.8 fL (ref 80.0–100.0)
Monocytes Absolute: 0.7 10*3/uL (ref 0.1–1.0)
Monocytes Relative: 10 %
Neutro Abs: 3.2 10*3/uL (ref 1.7–7.7)
Neutrophils Relative %: 40 %
Platelets: 326 10*3/uL (ref 150–400)
RBC: 4.35 MIL/uL (ref 4.22–5.81)
RDW: 12.7 % (ref 11.5–15.5)
WBC: 7.7 10*3/uL (ref 4.0–10.5)
nRBC: 0 % (ref 0.0–0.2)

## 2023-08-04 LAB — BASIC METABOLIC PANEL WITH GFR
Anion gap: 9 (ref 5–15)
BUN: 9 mg/dL (ref 8–23)
CO2: 21 mmol/L — ABNORMAL LOW (ref 22–32)
Calcium: 8.8 mg/dL — ABNORMAL LOW (ref 8.9–10.3)
Chloride: 97 mmol/L — ABNORMAL LOW (ref 98–111)
Creatinine, Ser: 0.6 mg/dL — ABNORMAL LOW (ref 0.61–1.24)
GFR, Estimated: >60 mL/min (ref >60)
Glucose, Bld: 94 mg/dL (ref 70–99)
Potassium: 3.9 mmol/L (ref 3.5–5.1)
Sodium: 127 mmol/L — ABNORMAL LOW (ref 135–145)

## 2023-08-04 MED ORDER — DOXYCYCLINE HYCLATE 100 MG PO CAPS
100.0000 mg | ORAL_CAPSULE | Freq: Two times a day (BID) | ORAL | 0 refills | Status: AC
  Filled 2023-08-04: qty 14, 7d supply, fill #0

## 2023-08-04 MED ORDER — HYDROCERIN EX CREA
TOPICAL_CREAM | Freq: Once | CUTANEOUS | Status: DC
  Filled 2023-08-04: qty 113

## 2023-08-04 MED ORDER — EUCERIN ORIGINAL HEALING EX CREA
TOPICAL_CREAM | CUTANEOUS | 0 refills | Status: AC | PRN
  Filled 2023-08-04: qty 454, 30d supply, fill #0

## 2023-08-04 NOTE — Discharge Planning (Signed)
 RNCM consulted in regards to medication assistance.  Pt has Medicaid insurance coverage and is not eligible for Medication Assistance Through [facility]) program. RNCM suggests sending Rx to [facility] at  [address]  to fill and pt can pick up at his convenience. No further CM needs communicated at this time.

## 2023-08-04 NOTE — ED Notes (Signed)
 Pt offered dry blanket, dry socks, and dry clothes

## 2023-08-04 NOTE — ED Triage Notes (Signed)
 Pt BIB [facility] from [employer] parking lot.  Pt reports concern for extremity pain that has been ongoing x1 week worsening today d/t exposure to wet/cold. Pt arrives with wet clothes and socks

## 2023-08-04 NOTE — Progress Notes (Signed)
 Provider inquired about shelter resources for pt. Attached resources to discharge paperwork.

## 2023-08-04 NOTE — Discharge Planning (Signed)
 RNCM consulted regarding PCP establishment. Pt presented to [facility] ED today with pain. RNCM obtained appointment on (7/30), time (3:00) with Lavona Pounds, NP and placed on After Visit Summary paperwork.  No further case management needs communicated at this time. Zakye Baby J. Rachel Budds, RN, BSN, [place] [phone]
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
## 2023-08-04 NOTE — Discharge Instructions (Addendum)
 It was a pleasure taking care of you today.  Today we evaluated the reason for your lower extremity and upper extremity pain.  Based on history, physical exam, as well as lab testing today the most likely diagnosis is skin breakdown due to cold and wet environment.  Please continue to keep the affected areas warm and dry, change socks and shoes regularly when they become wet.  Today you were prescribed a hydrating cream that can be placed on your feet, as well as an antibiotic for possible developing cellulitis.  Please use these medications as prescribed.  Please return to the emergency department or seek further medical care if you experience any of the following symptoms including but not limited to fever, chills, chest pain, shortness of breath, severe extremity pain, swelling/redness/pain in calf, signs of infection.  You have also been given a primary care scheduled visit as well as a referral for a podiatrist.  It would be ideal for you to follow-up with a either in 2-3 days to make sure symptoms are improving.

## 2023-10-21 ENCOUNTER — Encounter: Admitting: Family

## 2023-10-21 ENCOUNTER — Telehealth: Payer: Self-pay | Admitting: General Practice

## 2023-10-21 NOTE — Telephone Encounter (Signed)
 Called pt to reschedule missed NP appt at [facility]; could not reach or leave a vm. Pt has wrong phone number on chart

## 2023-10-21 NOTE — Progress Notes (Signed)
 Erroneous encounter-disregard

## 2023-12-22 ENCOUNTER — Encounter (HOSPITAL_COMMUNITY): Payer: Self-pay | Admitting: Emergency Medicine

## 2023-12-22 ENCOUNTER — Other Ambulatory Visit: Payer: Self-pay

## 2023-12-22 ENCOUNTER — Other Ambulatory Visit (HOSPITAL_COMMUNITY): Payer: Self-pay

## 2023-12-22 ENCOUNTER — Emergency Department (HOSPITAL_COMMUNITY)
Admission: EM | Admit: 2023-12-22 | Discharge: 2023-12-22 | Disposition: A | Discharge status: Home / Self Care | Attending: Emergency Medicine | Admitting: Emergency Medicine

## 2023-12-22 DIAGNOSIS — R21 Rash and other nonspecific skin eruption: Secondary | ICD-10-CM | POA: Diagnosis present

## 2023-12-22 MED ORDER — DEXAMETHASONE 2 MG PO TABS
10.0000 mg | ORAL_TABLET | Freq: Two times a day (BID) | ORAL | 0 refills | Status: AC
Start: 2023-12-22 — End: 2023-12-23
  Filled 2023-12-22: qty 10, 1d supply, fill #0

## 2023-12-22 MED ORDER — TRIAMCINOLONE 0.1 % CREAM:EUCERIN CREAM 1:1
TOPICAL_CREAM | Freq: Once | CUTANEOUS | Status: DC
  Filled 2023-12-22: qty 60

## 2023-12-22 MED ORDER — TRIAMCINOLONE ACETONIDE 0.1 % EX CREA
TOPICAL_CREAM | Freq: Once | CUTANEOUS | Status: AC
  Administered 2023-12-22: 1 via TOPICAL
  Filled 2023-12-22: qty 454
  Filled 2023-12-22: qty 15

## 2023-12-22 MED ORDER — DEXAMETHASONE 4 MG PO TABS
10.0000 mg | ORAL_TABLET | Freq: Once | ORAL | Status: AC
  Administered 2023-12-22: 10 mg via ORAL
  Filled 2023-12-22: qty 1

## 2023-12-22 NOTE — ED Notes (Signed)
 Pt ambulatory to waiting room. Pt verbalized understanding of discharge instructions.

## 2023-12-22 NOTE — Discharge Instructions (Addendum)
Follow up with your family doc in the office.  

## 2023-12-22 NOTE — ED Triage Notes (Signed)
 PT BIB [facility] from [facility] with reports of rash on palms of both hands. Pt reports they a flaky and itching on my palms.  Pt reports this has been on going for 2-3 weeks.

## 2023-12-22 NOTE — ED Provider Notes (Signed)
 [place] EMERGENCY DEPARTMENT AT [facility] Provider Note   CSN: [id number] Arrival date & time: 12/22/23  9171     Patient presents with: Rash   Jimmy [name] is a 68 y.o. male.   68 yo M with a chief complaint of seizure a rash on the hands.  This been going on for about a week.  He thinks maybe he came in contact with poison oak.  He denies rash elsewhere.  Denies rash feet denies rash to the mouth.  Denies any breathing or swallowing.   Rash      Prior to Admission medications   Medication Sig Start Date End Date Taking? Authorizing Provider  dexamethasone  (DECADRON ) 2 MG tablet Take 5 tablets (10 mg total) by mouth 2 (two) times daily with a meal for 1 day. Take in three days if still with symptoms. 12/22/23 12/23/23 Yes Emil Share, DO  etodolac  (LODINE ) 400 MG tablet Take 1 tablet (400 mg total) by mouth 2 (two) times daily. 04/13/23   Hildegard, Amjad, PA-C  lidocaine  (LIDODERM ) 5 % Place 1 patch onto the skin daily. Remove & Discard patch within 12 hours or as directed by MD 04/13/23   Hildegard Loge, PA-C  Skin Protectants, Misc. (EUCERIN) cream Apply topically as needed for dry skin. 08/04/23   Hinnant, Collin F, PA-C  famotidine  (PEPCID ) 20 MG tablet Take 1 tablet (20 mg total) by mouth 2 (two) times daily. 10/13/18 10/19/18  Armenta Canning, MD    Allergies: Neosporin [neomycin -bacitracin  zn-polymyx]    Review of Systems  Skin:  Positive for rash.    Updated Vital Signs BP (!) 156/59 (BP Location: Right Arm)   Pulse 88   Temp 98.4 F (36.9 C) (Oral)   Resp 16   SpO2 97%   Physical Exam Vitals and nursing note reviewed.  Constitutional:      Appearance: He is well-developed.  HENT:     Head: Normocephalic and atraumatic.  Eyes:     Pupils: Pupils are equal, round, and reactive to light.  Neck:     Vascular: No JVD.  Cardiovascular:     Rate and Rhythm: Normal rate and regular rhythm.     Heart sounds: No murmur heard.    No friction rub. No gallop.   Pulmonary:     Effort: No respiratory distress.     Breath sounds: No wheezing.  Abdominal:     General: There is no distension.     Tenderness: There is no abdominal tenderness. There is no guarding or rebound.  Musculoskeletal:        General: Normal range of motion.     Cervical back: Normal range of motion and neck supple.     Comments: Rash mostly to the palms of the hands.  Skin:    Coloration: Skin is not pale.     Findings: No rash.  Neurological:     Mental Status: He is alert and oriented to person, place, and time.  Psychiatric:        Behavior: Behavior normal.     (all labs ordered are listed, but only abnormal results are displayed) Labs Reviewed - No data to display  EKG: None  Radiology: No results found.   Procedures   Medications Ordered in the ED  triamcinolone  0.1 % cream : eucerin cream, 1:1 (has no administration in time range)  dexamethasone  (DECADRON ) tablet 10 mg (has no administration in time range)  Medical Decision Making Risk Prescription drug management.   68 yo M with a chief complaints of a rash to the hands.  He says been going on for about a week.  Spares the soles of the feet spares the mucous membranes otherwise.  He thinks maybe he was into poison ivy.  Give steroids.  PCP follow up.   8:54 AM:  I have discussed the diagnosis/risks/treatment options with the patient.  Evaluation and diagnostic testing in the emergency department does not suggest an emergent condition requiring admission or immediate intervention beyond what has been performed at this time.  They will follow up with PCP. We also discussed returning to the ED immediately if new or worsening sx occur. We discussed the sx which are most concerning (e.g., sudden worsening pain, fever, inability to tolerate by mouth) that necessitate immediate return. Medications administered to the patient during their visit and any new prescriptions  provided to the patient are listed below.  Medications given during this visit Medications  triamcinolone  0.1 % cream : eucerin cream, 1:1 (has no administration in time range)  dexamethasone  (DECADRON ) tablet 10 mg (has no administration in time range)     The patient appears reasonably screen and/or stabilized for discharge and I doubt any other medical condition or other [facility] requiring further screening, evaluation, or treatment in the ED at this time prior to discharge.       Final diagnoses:  Rash    ED Discharge Orders          Ordered    dexamethasone  (DECADRON ) 2 MG tablet  2 times daily with meals        12/22/23 0853               Emil Share, DO 12/22/23 [phone]

## 2024-03-14 ENCOUNTER — Emergency Department (HOSPITAL_COMMUNITY)
Admission: EM | Admit: 2024-03-14 | Discharge: 2024-03-14 | Disposition: A | Discharge status: Home / Self Care | Attending: Emergency Medicine | Admitting: Emergency Medicine

## 2024-03-14 ENCOUNTER — Other Ambulatory Visit: Payer: Self-pay

## 2024-03-14 ENCOUNTER — Encounter (HOSPITAL_COMMUNITY): Payer: Self-pay

## 2024-03-14 DIAGNOSIS — Z5900 Homelessness unspecified: Secondary | ICD-10-CM | POA: Diagnosis not present

## 2024-03-14 DIAGNOSIS — L03116 Cellulitis of left lower limb: Secondary | ICD-10-CM | POA: Insufficient documentation

## 2024-03-14 MED ORDER — CEPHALEXIN 500 MG PO CAPS: 500.0000 mg | ORAL_CAPSULE | Freq: Two times a day (BID) | ORAL | 0 refills | Status: AC
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
## 2024-03-14 NOTE — ED Provider Notes (Signed)
 " [place] EMERGENCY DEPARTMENT AT [facility] Provider Note   CSN: [id number] Arrival date & time: 03/14/24  1745     Patient presents with: Medical Screening (BP check)   Jimmy [name] is a 68 y.o. male.  Patient with past history significant for bipolar 1 disorder, seizure disorder, substance use disorder presents emergency department with concerns of a blood pressure check.  He reports he has no symptoms but he is requesting his blood pressure checked.  States that he has not been able to eat or drink anything for several days now.  He is currently homeless.  Does endorse redness and swelling to the right foot.  HPI     Prior to Admission medications  Medication Sig Start Date End Date Taking? Authorizing Provider  cephALEXin  (KEFLEX ) 500 MG capsule Take 1 capsule (500 mg total) by mouth 2 (two) times daily for 7 days. 03/14/24 03/21/24 Yes Tariya Morrissette A, PA-C  etodolac  (LODINE ) 400 MG tablet Take 1 tablet (400 mg total) by mouth 2 (two) times daily. 04/13/23   Hildegard, Amjad, PA-C  lidocaine  (LIDODERM ) 5 % Place 1 patch onto the skin daily. Remove & Discard patch within 12 hours or as directed by MD 04/13/23   Hildegard Loge, PA-C  Skin Protectants, Misc. (EUCERIN) cream Apply topically as needed for dry skin. 08/04/23   Hinnant, Collin F, PA-C  famotidine  (PEPCID ) 20 MG tablet Take 1 tablet (20 mg total) by mouth 2 (two) times daily. 10/13/18 10/19/18  Armenta Canning, MD    Allergies: Neosporin [neomycin -bacitracin  zn-polymyx]    Review of Systems  Skin:  Positive for rash.  All other systems reviewed and are negative.   Updated Vital Signs BP (!) 184/82   Pulse 97   Temp 98.4 F (36.9 C) (Oral)   Resp 17   Ht 5' 8 (1.727 m)   Wt 71.2 kg   SpO2 98%   BMI 23.87 kg/m   Physical Exam Vitals and nursing note reviewed.  Constitutional:      General: He is not in acute distress.    Appearance: He is well-developed.  HENT:     Head: Normocephalic and atraumatic.   Eyes:     Conjunctiva/sclera: Conjunctivae normal.  Cardiovascular:     Rate and Rhythm: Normal rate and regular rhythm.     Heart sounds: No murmur heard. Pulmonary:     Effort: Pulmonary effort is normal. No respiratory distress.     Breath sounds: Normal breath sounds.  Abdominal:     Palpations: Abdomen is soft.     Tenderness: There is no abdominal tenderness.  Musculoskeletal:        General: No swelling.     Cervical back: Neck supple.  Skin:    General: Skin is warm and dry.     Capillary Refill: Capillary refill takes less than 2 seconds.     Coloration: Skin is not pale.     Findings: Erythema present. No bruising or lesion.     Comments: Erythematous left foot with no appreciable swelling or edema. Slight warmth compared to the right.  Neurological:     Mental Status: He is alert.  Psychiatric:        Mood and Affect: Mood normal.     (all labs ordered are listed, but only abnormal results are displayed) Labs Reviewed - No data to display  EKG: None  Radiology: No results found.   Procedures   Medications Ordered in the ED - No data  to display                                  Medical Decision Making  This patient presents to the ED for concern of blood pressure check.  Differential diagnosis includes asymptomatic hypertension, hypertensive emergency, cellulitis, trench foot   Additional history obtained:  Additional history obtained from chart review   Problem List / ED Course:  Patient presents to the emergency department today with concerns of a blood pressure evaluation.  Reports that has been having concerns for his blood pressure although he states that he is not currently on any medications.  Denies any chest pain, shortness of breath, visual disturbance, leg swelling or edema, or severe sudden onset headache.  He reports that he is unsure if he has ever been prescribed any blood pressure medications but states that his blood pressure has  been remaining high when he ever he has been able to evaluate him.  He is currently homeless. Physical exam is unremarkable.  No appreciable cardiac murmur or abnormal lung sounds.  The left foot is erythematous and warm to the touch with no appreciable swelling or pitting edema seen. Based on exam, I am concerned for possible cellulitis.  Patient is not sanitary conditions at this time so I am concerned that he may be developing infection.  Will start patient on a course of Keflex .  Advised patient to take this in its entirety.  Return precautions advised.  Patient provided with food and drink prior to discharge.  He is otherwise stable for outpatient follow-up and discharged home.   Social Determinants of Health:  Homelessness  Final diagnoses:  Cellulitis of left lower extremity    ED Discharge Orders          Ordered    cephALEXin  (KEFLEX ) 500 MG capsule  2 times daily        03/14/24 1917               Carley Glendenning A, PA-C 03/14/24 1936  "

## 2024-03-14 NOTE — Discharge Instructions (Addendum)
 You were seen in the ER today for concerns of a blood pressure check. Your blood pressure is high and will need to be rechecked but you appear to have cellulitis, or infection of the skin of the left foot. I have started you on antibiotics. Please take this as prescribed. For any concerns of new or worsening symptoms, return to the ER.

## 2024-03-14 NOTE — ED Notes (Signed)
 Warm blankets provided.

## 2024-03-14 NOTE — ED Triage Notes (Signed)
 Pt arrives for a blood pressure check. Denies any symptoms or issues. States he has not eaten today except for cheez-its. Pt is homeless.

## 2024-04-05 ENCOUNTER — Emergency Department (HOSPITAL_COMMUNITY)
Admission: EM | Admit: 2024-04-05 | Discharge: 2024-04-06 | Attending: Emergency Medicine | Admitting: Emergency Medicine

## 2024-04-05 DIAGNOSIS — R0989 Other specified symptoms and signs involving the circulatory and respiratory systems: Secondary | ICD-10-CM | POA: Insufficient documentation

## 2024-04-05 DIAGNOSIS — Z5321 Procedure and treatment not carried out due to patient leaving prior to being seen by health care provider: Secondary | ICD-10-CM | POA: Diagnosis not present

## 2024-04-05 NOTE — ED Triage Notes (Signed)
 Patient BIBA coming from [facility]. Patient has no complaints, denied vital signs for EMS. Patient stated he was cold.

## 2024-04-06 NOTE — ED Notes (Signed)
 Pt stated he wanted a coffee and then he wanted to leave

## 2024-04-11 ENCOUNTER — Other Ambulatory Visit: Payer: Self-pay

## 2024-04-11 ENCOUNTER — Emergency Department (HOSPITAL_COMMUNITY)
Admission: EM | Admit: 2024-04-11 | Discharge: 2024-04-12 | Disposition: A | Attending: Emergency Medicine | Admitting: Emergency Medicine

## 2024-04-11 ENCOUNTER — Emergency Department (HOSPITAL_COMMUNITY)

## 2024-04-11 DIAGNOSIS — R21 Rash and other nonspecific skin eruption: Secondary | ICD-10-CM | POA: Diagnosis present

## 2024-04-11 DIAGNOSIS — R590 Localized enlarged lymph nodes: Secondary | ICD-10-CM | POA: Diagnosis not present

## 2024-04-11 DIAGNOSIS — L249 Irritant contact dermatitis, unspecified cause: Secondary | ICD-10-CM

## 2024-04-11 DIAGNOSIS — E119 Type 2 diabetes mellitus without complications: Secondary | ICD-10-CM | POA: Diagnosis not present

## 2024-04-11 DIAGNOSIS — Z5900 Homelessness unspecified: Secondary | ICD-10-CM | POA: Insufficient documentation

## 2024-04-11 DIAGNOSIS — L03314 Cellulitis of groin: Secondary | ICD-10-CM | POA: Diagnosis not present

## 2024-04-11 LAB — CBC WITH DIFFERENTIAL/PLATELET
Abs Immature Granulocytes: 0.04 K/uL (ref 0.00–0.07)
Basophils Absolute: 0.1 K/uL (ref 0.0–0.1)
Basophils Relative: 1 %
Eosinophils Absolute: 1.9 K/uL — ABNORMAL HIGH (ref 0.0–0.5)
Eosinophils Relative: 25 %
HCT: 35.9 % — ABNORMAL LOW (ref 39.0–52.0)
Hemoglobin: 12.7 g/dL — ABNORMAL LOW (ref 13.0–17.0)
Immature Granulocytes: 1 %
Lymphocytes Relative: 22 %
Lymphs Abs: 1.6 K/uL (ref 0.7–4.0)
MCH: 33.4 pg (ref 26.0–34.0)
MCHC: 35.4 g/dL (ref 30.0–36.0)
MCV: 94.5 fL (ref 80.0–100.0)
Monocytes Absolute: 0.8 K/uL (ref 0.1–1.0)
Monocytes Relative: 10 %
Neutro Abs: 3 K/uL (ref 1.7–7.7)
Neutrophils Relative %: 41 %
Platelets: 370 K/uL (ref 150–400)
RBC: 3.8 MIL/uL — ABNORMAL LOW (ref 4.22–5.81)
RDW: 12.1 % (ref 11.5–15.5)
WBC: 7.4 K/uL (ref 4.0–10.5)
nRBC: 0 % (ref 0.0–0.2)

## 2024-04-11 LAB — BASIC METABOLIC PANEL WITH GFR
Anion gap: 12 (ref 5–15)
BUN: 8 mg/dL (ref 8–23)
CO2: 23 mmol/L (ref 22–32)
Calcium: 9.7 mg/dL (ref 8.9–10.3)
Chloride: 89 mmol/L — ABNORMAL LOW (ref 98–111)
Creatinine, Ser: 0.68 mg/dL (ref 0.61–1.24)
GFR, Estimated: >60 mL/min
Glucose, Bld: 77 mg/dL (ref 70–99)
Potassium: 4.1 mmol/L (ref 3.5–5.1)
Sodium: 124 mmol/L — ABNORMAL LOW (ref 135–145)

## 2024-04-11 LAB — I-STAT CG4 LACTIC ACID, ED: Lactic Acid, Venous: 1 mmol/L (ref 0.5–1.9)

## 2024-04-11 MED ORDER — IOHEXOL 300 MG/ML  SOLN
100.0000 mL | Freq: Once | INTRAMUSCULAR | Status: AC | PRN
  Administered 2024-04-11: 100 mL via INTRAVENOUS

## 2024-04-11 MED ORDER — SODIUM CHLORIDE 0.9 % IV BOLUS
1000.0000 mL | Freq: Once | INTRAVENOUS | Status: AC
  Administered 2024-04-12: 1000 mL via INTRAVENOUS

## 2024-04-11 MED ORDER — CEFAZOLIN SODIUM-DEXTROSE 2-4 GM/100ML-% IV SOLN
2.0000 g | Freq: Once | INTRAVENOUS | Status: AC
  Administered 2024-04-12: 2 g via INTRAVENOUS
  Filled 2024-04-11: qty 100

## 2024-04-11 MED ORDER — DOXYCYCLINE HYCLATE 100 MG PO CAPS
100.0000 mg | ORAL_CAPSULE | Freq: Two times a day (BID) | ORAL | 0 refills | Status: AC
  Filled 2024-04-11: qty 14, 7d supply, fill #0

## 2024-04-11 NOTE — ED Provider Notes (Signed)
 " [place] EMERGENCY DEPARTMENT AT [facility] Provider Note   CSN: [id number] Arrival date & time: 04/11/24  1836     Patient presents with: Rash and Skin Problem   Jimmy [name] is a 69 y.o. male.  With a history of homelessness, alcohol  use disorder and cellulitis who presents to the ED for skin infection.  Patient reports ongoing discomfort and erythema in the groin region for the last 2 days.  He is currently unhoused and has been sleeping outside.  His pants are often soiled with urine and stool.  He is not certain exactly when rash over his groin started.  Was seen for cellulitis of left lower extremity in the ED on December 22.  Discharged with course of Keflex .  No fevers chills or other systemic complaints at this time    Rash      Prior to Admission medications  Medication Sig Start Date End Date Taking? Authorizing Provider  doxycycline  (VIBRAMYCIN ) 100 MG capsule Take 1 capsule (100 mg total) by mouth 2 (two) times daily for 7 days. 04/11/24 04/18/24 Yes Pamella Ozell LABOR, DO  etodolac  (LODINE ) 400 MG tablet Take 1 tablet (400 mg total) by mouth 2 (two) times daily. 04/13/23   Hildegard Loge, PA-C  lidocaine  (LIDODERM ) 5 % Place 1 patch onto the skin daily. Remove & Discard patch within 12 hours or as directed by MD 04/13/23   Hildegard Loge, PA-C  Skin Protectants, Misc. (EUCERIN) cream Apply topically as needed for dry skin. 08/04/23   Hinnant, Collin F, PA-C  famotidine  (PEPCID ) 20 MG tablet Take 1 tablet (20 mg total) by mouth 2 (two) times daily. 10/13/18 10/19/18  Armenta Canning, MD    Allergies: Neosporin [neomycin -bacitracin  zn-polymyx]    Review of Systems  Skin:  Positive for rash.    Updated Vital Signs BP (!) 149/71 (BP Location: Left Arm)   Pulse 98   Temp 98.7 F (37.1 C)   Resp (!) 21   SpO2 98%   Physical Exam Vitals and nursing note reviewed.  Constitutional:      Comments: Unkempt  HENT:     Head: Normocephalic and atraumatic.  Eyes:      Pupils: Pupils are equal, round, and reactive to light.  Cardiovascular:     Rate and Rhythm: Normal rate and regular rhythm.  Pulmonary:     Effort: Pulmonary effort is normal.     Breath sounds: Normal breath sounds.  Abdominal:     Palpations: Abdomen is soft.     Tenderness: There is no abdominal tenderness.  Skin:    General: Skin is warm and dry.     Comments: Patchy scaly erythema over bilateral anterior medial thighs and testes and perineum with erythema of both feet as well No purulent drainage Pictured below  Neurological:     Mental Status: He is alert.  Psychiatric:        Mood and Affect: Mood normal.     (all labs ordered are listed, but only abnormal results are displayed) Labs Reviewed  BASIC METABOLIC PANEL WITH GFR - Abnormal; Notable for the following components:      Result Value   Sodium 124 (*)    Chloride 89 (*)    All other components within normal limits  CBC WITH DIFFERENTIAL/PLATELET - Abnormal; Notable for the following components:   RBC 3.80 (*)    Hemoglobin 12.7 (*)    HCT 35.9 (*)    Eosinophils Absolute 1.9 (*)  All other components within normal limits  CULTURE, BLOOD (ROUTINE X 2)  CULTURE, BLOOD (ROUTINE X 2)  I-STAT CG4 LACTIC ACID, ED  I-STAT CG4 LACTIC ACID, ED    EKG: None  Radiology: No results found.   Procedures   Medications Ordered in the ED  ceFAZolin  (ANCEF ) IVPB 2g/100 mL premix (has no administration in time range)  iohexol  (OMNIPAQUE ) 300 MG/ML solution 100 mL (has no administration in time range)    Clinical Course as of 04/11/24 2328  Mon Apr 11, 2024  2328 [name], am transitioning care of this patient to the oncoming provider pending remainder of laboratory workup, CT pelvis reevaluation and disposition [MP]    Clinical Course User Index [MP] Marine Ozell LABOR, DO                                 Medical Decision Making 69 year old male who is currently unhoused presenting given concern  for recurrent cellulitis.  Was seen last month in the ED for cellulitis of left lower extremity.  Now reports painful rash over groin testes and perineum.  Says his pants are often soiled with urine feces and he has been sleeping outdoors.  Exam notable for erythematous scaly rash and skin breakdown.  No signs of systemic illness but will obtain laboratory workup along with CT pelvis with IV contrast to evaluate for any evidence of Fournier's gangrene.  Will cover with Ancef  here and get him cleaned up while he is in the ED  Risk Prescription drug management.        Final diagnoses:  Cellulitis of groin    ED Discharge Orders          Ordered    doxycycline  (VIBRAMYCIN ) 100 MG capsule  2 times daily        04/11/24 2328               Marine Ozell LABOR, DO 04/11/24 2328  "

## 2024-04-11 NOTE — Discharge Instructions (Addendum)
 You were seen in the Emergency Department for a rash in your groin There is no evidence of severe infection based on your blood work or CAT scan We have called in a prescription to the [facility] health pharmacy for an antibiotic to help treat the rash, as well as a barrier cream We have provided you with new clothing Please do your best to keep dry as the infection was a result of prolonged time in soiled pants Return to the emerged permit for fevers or severe infection

## 2024-04-11 NOTE — ED Provider Triage Note (Signed)
 Emergency Medicine Provider Triage Evaluation Note  Jimmy [name] , a 69 y.o. male  was evaluated in triage.  Pt complains of painful rash of groin and legs. Progessively worsening rash of the groin and legs.  Review of Systems  Positive: Rash  Negative: fever  Physical Exam  BP (!) 176/86 (BP Location: Left Arm)   Pulse 86   Temp 98 F (36.7 C) (Oral)   Resp 16   SpO2 100%  Gen:   Awake, no distress   Resp:  Normal effort  MSK:   Moves extremities without difficulty  Other:  Diffuse erythematous ,scaling rash of the groin and legs  Medical Decision Making  Medically screening exam initiated at 7:02 PM.  Appropriate orders placed.  Jimmy [name] was informed that the remainder of the evaluation will be completed by another provider, this initial triage assessment does not replace that evaluation, and the importance of remaining in the ED until their evaluation is complete.     Arloa Chroman, PA-C 04/11/24 2057

## 2024-04-11 NOTE — ED Provider Notes (Signed)
 Patient presenting with groin rash.  History and exam are consistent with a contact dermatitis from wearing soiled pants.  He is a diabetic and is undergoing CT to rule out severe infection.  If negative, plan be for discharge with prescription for antibiotics for treatment of possible superimposed cellulitis. Physical Exam  BP (!) 149/71 (BP Location: Left Arm)   Pulse 98   Temp 98.7 F (37.1 C)   Resp (!) 21   SpO2 98%   Physical Exam Vitals and nursing note reviewed.  Constitutional:      General: He is not in acute distress.    Appearance: Normal appearance. He is well-developed. He is not ill-appearing, toxic-appearing or diaphoretic.  HENT:     Head: Normocephalic and atraumatic.     Right Ear: External ear normal.     Left Ear: External ear normal.     Nose: Nose normal.     Mouth/Throat:     Mouth: Mucous membranes are moist.  Eyes:     Extraocular Movements: Extraocular movements intact.     Conjunctiva/sclera: Conjunctivae normal.  Cardiovascular:     Rate and Rhythm: Normal rate and regular rhythm.  Pulmonary:     Effort: Pulmonary effort is normal. No respiratory distress.  Abdominal:     General: There is no distension.     Palpations: Abdomen is soft.  Musculoskeletal:        General: No swelling. Normal range of motion.     Cervical back: Normal range of motion and neck supple.  Skin:    General: Skin is warm and dry.  Neurological:     General: No focal deficit present.     Mental Status: He is alert and oriented to person, place, and time.  Psychiatric:        Mood and Affect: Mood normal.        Behavior: Behavior normal.     Procedures  Procedures  ED Course / MDM   Clinical Course as of 04/12/24 0104  Mon Apr 11, 2024  2328 [name], am transitioning care of this patient to the oncoming provider pending remainder of laboratory workup, CT pelvis reevaluation and disposition [MP]    Clinical Course User Index [MP] Marine Ozell LABOR,  DO   Medical Decision Making Risk OTC drugs. Prescription drug management.   On assessment, patient resting comfortably.  He requests some food and was given a sandwich.  His CT scan showed enlarged bilateral inguinal lymph nodes without any other acute findings.  Patient was ordered barrier cream to help with his rash which is consistent with the contact dermatitis.  He is stable for discharge.       Melvenia Motto, MD 04/12/24 [phone]

## 2024-04-11 NOTE — ED Triage Notes (Incomplete)
 Pt arrives via EMS from [facility]. PT has complaints of a rash that begins at his groin, and goes down both legs. Pt endorses pain with sitting.

## 2024-04-11 NOTE — ED Notes (Signed)
 Patient transported to CT

## 2024-04-12 ENCOUNTER — Other Ambulatory Visit: Payer: Self-pay

## 2024-04-12 ENCOUNTER — Other Ambulatory Visit (HOSPITAL_COMMUNITY): Payer: Self-pay

## 2024-04-12 MED ORDER — ZINC OXIDE 40 % EX OINT
TOPICAL_OINTMENT | Freq: Once | CUTANEOUS | Status: AC
  Filled 2024-04-12: qty 57

## 2024-04-12 MED ORDER — ZINC OXIDE 40 % EX OINT
1.0000 | TOPICAL_OINTMENT | CUTANEOUS | 0 refills | Status: AC | PRN
  Filled 2024-04-12: qty 57, 30d supply, fill #0

## 2024-04-12 NOTE — ED Notes (Signed)
 Positive blood culture reported to Dr. Armenta.

## 2024-04-13 ENCOUNTER — Other Ambulatory Visit (HOSPITAL_COMMUNITY): Payer: Self-pay

## 2024-04-13 NOTE — ED Notes (Addendum)
 Positive blood culture called to Dr. Simon. Per Dr. Simon, if pt. If symptomatic then he must come back to the ER for evaluation of symptoms. Pt. Called to assess if symptoms were getting better. Spoke with patient's emergency contact Bill. Zell states that he will bring pt. Back in if pt. Is feeling worse. DELLAR, RN 04/13/2024 1942

## 2024-04-14 LAB — BLOOD CULTURE ID PANEL (REFLEXED) - BCID2

## 2024-04-15 ENCOUNTER — Other Ambulatory Visit (HOSPITAL_COMMUNITY): Payer: Self-pay

## 2024-04-15 LAB — CULTURE, BLOOD (ROUTINE X 2)
Culture  Setup Time: NO GROWTH
Special Requests: ADEQUATE

## 2024-04-16 ENCOUNTER — Telehealth (HOSPITAL_BASED_OUTPATIENT_CLINIC_OR_DEPARTMENT_OTHER): Payer: Self-pay | Admitting: *Deleted

## 2024-04-16 NOTE — Progress Notes (Signed)
 ED Antimicrobial Stewardship Positive Culture Follow Up   Jimmy [name] is an 69 y.o. male who presented to [facility] on 04/11/2024 with a chief complaint of  Chief Complaint  Patient presents with   Rash   Skin Problem    Recent Results (from the past 720 hours)  Culture, blood (routine x 2)     Status: Abnormal   Collection Time: 04/11/24  9:54 PM   Specimen: BLOOD RIGHT ARM  Result Value Ref Range Status   Specimen Description   Final    BLOOD RIGHT ARM Performed at [facility], [address] [place], [place] [address]    Special Requests   Final    BOTTLES DRAWN AEROBIC AND ANAEROBIC Blood Culture adequate volume Performed at [facility], [address] [place], [place] [address]    Culture  Setup Time   Final    GRAM POSITIVE RODS IN BOTH AEROBIC AND ANAEROBIC BOTTLES CRITICAL VALUE NOTED.  VALUE IS CONSISTENT WITH PREVIOUSLY REPORTED AND CALLED VALUE.    Culture (A)  Final    CORYNEBACTERIUM, GROUP JK Standardized susceptibility testing for this organism is not available. Performed at [facility], [address], [place], [place] [address]    Report Status 04/15/2024 FINAL  Final  Culture, blood (routine x 2)     Status: None (Preliminary result)   Collection Time: 04/11/24  9:56 PM   Specimen: BLOOD LEFT HAND  Result Value Ref Range Status   Specimen Description   Final    BLOOD LEFT HAND Performed at [facility], [address], [place], [place] [address]    Special Requests   Final    BOTTLES DRAWN AEROBIC AND ANAEROBIC Blood Culture adequate volume Performed at [facility], [address], [place], [place] [address]    Culture  Setup Time   Final    GRAM POSITIVE RODS ANAEROBIC BOTTLE ONLY Gram Stain Report Called to,Read Back By and Verified With: RN GRAY. S on [id number] @1925  by SM GRAM POSITIVE COCCI GRAM POSITIVE RODS AEROBIC BOTTLE ONLY CRITICAL RESULT CALLED TO,  READ BACK BY AND VERIFIED WITH: RN CANDIE PEREYRA [phone] @ [phone] FH Performed at [facility], [address] [place], [place] [address]    Culture GRAM POSITIVE RODS  Final   Report Status PENDING  Incomplete  Blood Culture ID Panel (Reflexed)     Status: None   Collection Time: 04/11/24  9:56 PM  Result Value Ref Range Status   Enterococcus faecalis NOT DETECTED NOT DETECTED Final   Enterococcus Faecium NOT DETECTED NOT DETECTED Final   Listeria monocytogenes NOT DETECTED NOT DETECTED Final   Staphylococcus species NOT DETECTED NOT DETECTED Final   Staphylococcus aureus (BCID) NOT DETECTED NOT DETECTED Final   Staphylococcus epidermidis NOT DETECTED NOT DETECTED Final   Staphylococcus lugdunensis NOT DETECTED NOT DETECTED Final   Streptococcus species NOT DETECTED NOT DETECTED Final   Streptococcus agalactiae NOT DETECTED NOT DETECTED Final   Streptococcus pneumoniae NOT DETECTED NOT DETECTED Final   Streptococcus pyogenes NOT DETECTED NOT DETECTED Final   A.calcoaceticus-baumannii NOT DETECTED NOT DETECTED Final   Bacteroides fragilis NOT DETECTED NOT DETECTED Final   Enterobacterales NOT DETECTED NOT DETECTED Final   Enterobacter cloacae complex NOT DETECTED NOT DETECTED Final   Escherichia coli NOT DETECTED NOT DETECTED Final   Klebsiella aerogenes NOT DETECTED NOT DETECTED Final   Klebsiella oxytoca NOT DETECTED NOT DETECTED Final   Klebsiella  pneumoniae NOT DETECTED NOT DETECTED Final   Proteus species NOT DETECTED NOT DETECTED Final   Salmonella species NOT DETECTED NOT DETECTED Final   Serratia marcescens NOT DETECTED NOT DETECTED Final   Haemophilus influenzae NOT DETECTED NOT DETECTED Final   Neisseria meningitidis NOT DETECTED NOT DETECTED Final   Pseudomonas aeruginosa NOT DETECTED NOT DETECTED Final   Stenotrophomonas maltophilia NOT DETECTED NOT DETECTED Final   Candida albicans NOT DETECTED NOT DETECTED Final   Candida auris NOT DETECTED NOT DETECTED Final   Candida  glabrata NOT DETECTED NOT DETECTED Final   Candida krusei NOT DETECTED NOT DETECTED Final   Candida parapsilosis NOT DETECTED NOT DETECTED Final   Candida tropicalis NOT DETECTED NOT DETECTED Final   Cryptococcus neoformans/gattii NOT DETECTED NOT DETECTED Final    Comment: Performed at [facility], [address], [place], [place] [address]    [x]  Culture reviewed  New antibiotic prescription: None  ED Provider:  - See note from RN on 1/21 where culture was called in to ED and addressed by EDP.     Dolphus Roller, PharmD, BCPS 04/16/2024 8:56 AM

## 2024-04-16 NOTE — Telephone Encounter (Signed)
 Post ED Visit - Positive Culture Follow-up  Culture report reviewed by antimicrobial stewardship pharmacist: [facility] []  Rankin Dee, Pharm.D. []  Venetia Gully, Pharm.D., BCPS AQ-ID []  Garrel Crews, Pharm.D., BCPS []  Almarie Lunger, Pharm.D., BCPS []  [place], [address].D., BCPS, AAHIVP []  Rosaline Bihari, Pharm.D., BCPS, AAHIVP []  Vernell Meier, PharmD, BCPS []  Latanya Hint, PharmD, BCPS []  Donald Medley, PharmD, BCPS []  Rocky Bold, PharmD []  Dorothyann Alert, PharmD, BCPS []  Morene Babe, PharmD  [facility] []  Rosaline Edison, PharmD []  Romona Bliss, PharmD [x]  Dolphus Roller, PharmD []  Veva Seip, Rph []  Vernell Daunt) Leonce, PharmD []  Eva Allis, PharmD []  Rosaline Millet, PharmD []  Iantha Batch, PharmD []  Arvin Gauss, PharmD []  Wanda Hasting, PharmD []  Ronal Rav, PharmD []  Rocky Slade, PharmD []  Bard Jeans, PharmD   Positive blood culture   See note from RN on 1/21 where culture was called in to ED and addressed by EDP.   Jimmy [name] 04/16/2024, 11:50 AM

## 2024-04-18 LAB — CULTURE, BLOOD (ROUTINE X 2): Special Requests: ADEQUATE

## 2024-04-25 ENCOUNTER — Other Ambulatory Visit (HOSPITAL_COMMUNITY): Payer: Self-pay

## 2024-04-28 ENCOUNTER — Emergency Department (HOSPITAL_COMMUNITY)
Admission: EM | Admit: 2024-04-28 | Discharge: 2024-04-28 | Disposition: A | Discharge status: Home / Self Care | Source: Home / Self Care | Attending: Emergency Medicine | Admitting: Emergency Medicine

## 2024-04-28 ENCOUNTER — Emergency Department (HOSPITAL_COMMUNITY)

## 2024-04-28 ENCOUNTER — Other Ambulatory Visit: Payer: Self-pay

## 2024-04-28 DIAGNOSIS — K5900 Constipation, unspecified: Secondary | ICD-10-CM

## 2024-04-28 DIAGNOSIS — R1084 Generalized abdominal pain: Secondary | ICD-10-CM

## 2024-04-28 LAB — COMPREHENSIVE METABOLIC PANEL WITH GFR
ALT: 18 U/L (ref 0–44)
AST: 29 U/L (ref 15–41)
Albumin: 4.2 g/dL (ref 3.5–5.0)
Alkaline Phosphatase: 69 U/L (ref 38–126)
Anion gap: 12 (ref 5–15)
BUN: 21 mg/dL (ref 8–23)
CO2: 22 mmol/L (ref 22–32)
Calcium: 9.2 mg/dL (ref 8.9–10.3)
Chloride: 94 mmol/L — ABNORMAL LOW (ref 98–111)
Creatinine, Ser: 0.89 mg/dL (ref 0.61–1.24)
GFR, Estimated: >60 mL/min
Glucose, Bld: 101 mg/dL — ABNORMAL HIGH (ref 70–99)
Potassium: 4.3 mmol/L (ref 3.5–5.1)
Sodium: 128 mmol/L — ABNORMAL LOW (ref 135–145)
Total Bilirubin: 0.4 mg/dL (ref 0.0–1.2)
Total Protein: 7.7 g/dL (ref 6.5–8.1)

## 2024-04-28 LAB — URINALYSIS, ROUTINE W REFLEX MICROSCOPIC
Bilirubin Urine: NEGATIVE
Glucose, UA: NEGATIVE mg/dL
Hgb urine dipstick: NEGATIVE
Ketones, ur: NEGATIVE mg/dL
Leukocytes,Ua: NEGATIVE
Nitrite: NEGATIVE
Protein, ur: NEGATIVE mg/dL
Specific Gravity, Urine: >1.06 — ABNORMAL HIGH (ref 1.005–1.030)
pH: 6 (ref 5.0–8.0)

## 2024-04-28 LAB — CBC WITH DIFFERENTIAL/PLATELET
Abs Immature Granulocytes: 0.04 10*3/uL (ref 0.00–0.07)
Basophils Absolute: 0.1 10*3/uL (ref 0.0–0.1)
Basophils Relative: 1 %
Eosinophils Absolute: 2 10*3/uL — ABNORMAL HIGH (ref 0.0–0.5)
Eosinophils Relative: 18 %
HCT: 41.1 % (ref 39.0–52.0)
Hemoglobin: 14.1 g/dL (ref 13.0–17.0)
Immature Granulocytes: 0 %
Lymphocytes Relative: 17 %
Lymphs Abs: 1.9 10*3/uL (ref 0.7–4.0)
MCH: 33 pg (ref 26.0–34.0)
MCHC: 34.3 g/dL (ref 30.0–36.0)
MCV: 96.3 fL (ref 80.0–100.0)
Monocytes Absolute: 0.8 10*3/uL (ref 0.1–1.0)
Monocytes Relative: 7 %
Neutro Abs: 6.1 10*3/uL (ref 1.7–7.7)
Neutrophils Relative %: 57 %
Platelets: 439 10*3/uL — ABNORMAL HIGH (ref 150–400)
RBC: 4.27 MIL/uL (ref 4.22–5.81)
RDW: 12.2 % (ref 11.5–15.5)
WBC: 11 10*3/uL — ABNORMAL HIGH (ref 4.0–10.5)
nRBC: 0 % (ref 0.0–0.2)

## 2024-04-28 LAB — RESP PANEL BY RT-PCR (RSV, FLU A&B, COVID)  RVPGX2
Influenza A by PCR: NEGATIVE
Influenza B by PCR: NEGATIVE
Resp Syncytial Virus by PCR: NEGATIVE
SARS Coronavirus 2 by RT PCR: NEGATIVE

## 2024-04-28 LAB — LIPASE, BLOOD: Lipase: 40 U/L (ref 11–51)

## 2024-04-28 MED ORDER — IOHEXOL 350 MG/ML SOLN
75.0000 mL | Freq: Once | INTRAVENOUS | Status: AC | PRN
  Administered 2024-04-28: 75 mL via INTRAVENOUS

## 2024-04-28 NOTE — ED Provider Triage Note (Signed)
 Emergency Medicine Provider Triage Evaluation Note  Jimmy [name] , a 69 y.o. male  was evaluated in triage.  Pt complains of bodyaches and abdominal pain. Mainly complains of constipation, denies bowel movement in 2 days. Still passing gas. No other associated symptoms.   Review of Systems  Positive: Abdominal pain, constipation Negative: Fever, n/v/d  Physical Exam  BP 120/81   Pulse 89   Resp 18   Ht 5' 8 (1.727 m)   Wt 71 kg   SpO2 99%   BMI 23.80 kg/m  Gen:   Awake, no distress   Resp:  Normal effort  MSK:   Moves extremities without difficulty  Other:    Medical Decision Making  Medically screening exam initiated at 12:13 PM.  Appropriate orders placed.  TENOCH MCCLURE was informed that the remainder of the evaluation will be completed by another provider, this initial triage assessment does not replace that evaluation, and the importance of remaining in the ED until their evaluation is complete.     Neysa Thersia RAMAN, [place] 04/28/24 1216

## 2024-04-28 NOTE — ED Notes (Signed)
 Pt presented earlier as d/c'd. Asked for shoes and a bus pass. Pt has been given 10.5 athletic shoes by case manager, and a bus pass by ED RN. At this time pt actually still waiting to be seen, and is an active ED pt. Sitting in chair in w/r. Pt remains alert, NAD, calm, interactive, resps e/u, speaking in clear complete sentences. Steady gait.

## 2024-04-28 NOTE — Discharge Instructions (Signed)
 Thank you for letting us  take care of you today.  You came in today because of abdominal pain.  We did blood work that was overall reassuring.  We think this was likely constipation given that it resolved after you have a bowel movement.  We recommend that you use MiraLAX  as needed at home.  You may use a capful daily to help with regular bowel movements.  Please come back to the emergency department if you have persistent or worsening symptoms.

## 2024-04-28 NOTE — ED Notes (Signed)
 Pt given a sandwich bag and ginger ale with ice

## 2024-04-28 NOTE — ED Provider Notes (Signed)
 " [name] Provider Note   CSN: [id number] Arrival date & time: 04/28/24  1121     Patient presents with: Abdominal Pain   Jimmy [name] is a 69 y.o. male who presents today for evaluation of abdominal pain.  Patient reports that he has been constipated for the past 24 hours and is at his abdominal discomfort.  His presenting today for further evaluation.  Patient did have a bowel movement while in the waiting room and reports that he feels significant better at this point in time.  Denies any fevers, chills, nausea, vomiting, chest pain or shortness of breath.  Abdominal Pain      Prior to Admission medications  Medication Sig Start Date End Date Taking? Authorizing Provider  etodolac  (LODINE ) 400 MG tablet Take 1 tablet (400 mg total) by mouth 2 (two) times daily. 04/13/23   Hildegard Loge, PA-C  lidocaine  (LIDODERM ) 5 % Place 1 patch onto the skin daily. Remove & Discard patch within 12 hours or as directed by MD 04/13/23   Hildegard Loge, PA-C  liver oil-zinc  oxide (DESITIN) 40 % ointment Apply 1 Application topically as needed for irritation. 04/12/24   Melvenia Motto, MD  Skin Protectants, Misc. (EUCERIN) cream Apply topically as needed for dry skin. 08/04/23   Hinnant, Collin F, PA-C  famotidine  (PEPCID ) 20 MG tablet Take 1 tablet (20 mg total) by mouth 2 (two) times daily. 10/13/18 10/19/18  Armenta Canning, MD    Allergies: Neosporin [neomycin -bacitracin  zn-polymyx]    Review of Systems  Gastrointestinal:  Positive for abdominal pain.    Updated Vital Signs BP 120/81   Pulse 89   Resp 18   Ht 5' 8 (1.727 m)   Wt 71 kg   SpO2 99%   BMI 23.80 kg/m   Physical Exam Constitutional:      General: He is not in acute distress.    Appearance: He is well-developed.  HENT:     Head: Normocephalic.  Eyes:     Extraocular Movements: Extraocular movements intact.  Cardiovascular:     Rate and Rhythm: Normal rate and regular rhythm.      Heart sounds: Normal heart sounds.  Pulmonary:     Effort: Pulmonary effort is normal.  Abdominal:     General: Abdomen is flat. Bowel sounds are normal. There is no distension.     Palpations: Abdomen is soft.     Tenderness: There is no abdominal tenderness.  Skin:    General: Skin is warm.     Capillary Refill: Capillary refill takes less than 2 seconds.  Neurological:     General: No focal deficit present.     Mental Status: He is alert.  Psychiatric:        Mood and Affect: Mood normal.     (all labs ordered are listed, but only abnormal results are displayed) Labs Reviewed  COMPREHENSIVE METABOLIC PANEL WITH GFR - Abnormal; Notable for the following components:      Result Value   Sodium 128 (*)    Chloride 94 (*)    Glucose, Bld 101 (*)    All other components within normal limits  CBC WITH DIFFERENTIAL/PLATELET - Abnormal; Notable for the following components:   WBC 11.0 (*)    Platelets 439 (*)    Eosinophils Absolute 2.0 (*)    All other components within normal limits  RESP PANEL BY RT-PCR (RSV, FLU A&B, COVID)  [id number]  LIPASE, BLOOD  URINALYSIS, ROUTINE  W REFLEX MICROSCOPIC    EKG: None  Radiology: CT ABDOMEN PELVIS W CONTRAST Result Date: 04/28/2024 EXAM: CT ABDOMEN AND PELVIS WITH CONTRAST 04/28/2024 04:07:00 PM TECHNIQUE: CT of the abdomen and pelvis was performed with the administration of 75 mL of iohexol  (OMNIPAQUE ) 350 MG/ML injection. Multiplanar reformatted images are provided for review. Automated exposure control, iterative reconstruction, and/or weight-based adjustment of the mA/kV was utilized to reduce the radiation dose to as low as reasonably achievable. COMPARISON: Comparison exam 09/28/2020. CLINICAL HISTORY: Abdominal pain, acute, nonlocalized. Acute, nonlocalized abdominal pain. FINDINGS: LOWER CHEST: No acute abnormality. LIVER: The liver is unremarkable. GALLBLADDER AND BILE DUCTS: Cholecystectomy. No biliary ductal dilatation. SPLEEN: No  acute abnormality. PANCREAS: No acute abnormality. ADRENAL GLANDS: No acute abnormality. KIDNEYS, URETERS AND BLADDER: No stones in the kidneys or ureters. No hydronephrosis. No perinephric or periureteral stranding. Urinary bladder is unremarkable. GI AND BOWEL: Stomach demonstrates no acute abnormality. Normal appendix. Normal colon. There is no bowel obstruction. PERITONEUM AND RETROPERITONEUM: No ascites. No free air. VASCULATURE: Aorta is normal in caliber. Vascular coils in the GDA artery. Atherosclerotic calcification of the aorta. LYMPH NODES: No lymphadenopathy. REPRODUCTIVE ORGANS: No acute abnormality. BONES AND SOFT TISSUES: No acute osseous abnormality. No focal soft tissue abnormality. Right 84 divided by 7 tip. IMPRESSION: 1. No acute findings in the abdomen or pelvis. Electronically signed by: Norleen Boxer MD 04/28/2024 04:31 PM EST RP Workstation: [id number]    Procedures   Medications Ordered in the ED  iohexol  (OMNIPAQUE ) 350 MG/ML injection 75 mL (75 mLs Intravenous Contrast Given 04/28/24 1611)                                  Medical Decision Making  Patient is a 69 year old male who presents today for evaluation of abdominal pain in the setting of constipation.  On initial assessment patient was noted to be hemodynamically stable and afebrile.  On my bedside assessment patient was noted be resting company without acute distress.  Physical exam and without any acute abnormalities.  Patient is actively eating and tolerating p.o. well.  At the time my assessment patient already had laboratory evaluation as well as imaging studies that was completed in the triage process.  Mild hyponatremia to 128 but otherwise no significant findings on CBC or metabolic panel.  Patient also had a CT scan of his abdomen and pelvis that did not show any acute intra-abdominal process.  I do suspect that his symptoms likely related to constipation given that he had symptomatic relief after passing a  bowel movement.  I recommended that he take capful of MiraLAX  daily to help with regularity.  No concern at this time for any pancreatitis, sepsis, cholecystitis, hepatitis, bowel ischemia, acute aortic pathology.  Return precaution discussed.  Patient was discharged in stable condition.   Final diagnoses:  None    ED Discharge Orders     None          Laurita Sieving, MD 04/28/24 2043    Ruthe Cornet, DO 04/28/24 2044    Ruthe Cornet, DO 04/28/24 2045  "

## 2024-04-28 NOTE — ED Triage Notes (Signed)
 Patient here with complaints of right sided abdominal pain and diarrhea. He has been having symptoms yesterday. Denies vomiting and fever.
